# Patient Record
Sex: Female | Born: 1970 | Race: White | Hispanic: No | Smoking: Never smoker
Health system: Southern US, Community
[De-identification: ages and names within clinical notes are randomized; demographics above are authoritative.]

## PROBLEM LIST (undated history)

## (undated) DIAGNOSIS — Z5189 Encounter for other specified aftercare: Secondary | ICD-10-CM

## (undated) DIAGNOSIS — J45909 Unspecified asthma, uncomplicated: Secondary | ICD-10-CM

## (undated) DIAGNOSIS — M7751 Other enthesopathy of right foot: Secondary | ICD-10-CM

## (undated) DIAGNOSIS — F329 Major depressive disorder, single episode, unspecified: Secondary | ICD-10-CM

## (undated) DIAGNOSIS — I509 Heart failure, unspecified: Secondary | ICD-10-CM

## (undated) DIAGNOSIS — E079 Disorder of thyroid, unspecified: Secondary | ICD-10-CM

## (undated) DIAGNOSIS — J449 Chronic obstructive pulmonary disease, unspecified: Secondary | ICD-10-CM

## (undated) DIAGNOSIS — I4729 Other ventricular tachycardia: Secondary | ICD-10-CM

## (undated) DIAGNOSIS — E119 Type 2 diabetes mellitus without complications: Secondary | ICD-10-CM

## (undated) DIAGNOSIS — F32A Depression, unspecified: Secondary | ICD-10-CM

## (undated) DIAGNOSIS — I471 Supraventricular tachycardia, unspecified: Secondary | ICD-10-CM

## (undated) DIAGNOSIS — R9389 Abnormal findings on diagnostic imaging of other specified body structures: Secondary | ICD-10-CM

## (undated) DIAGNOSIS — I10 Essential (primary) hypertension: Secondary | ICD-10-CM

## (undated) DIAGNOSIS — I639 Cerebral infarction, unspecified: Secondary | ICD-10-CM

## (undated) DIAGNOSIS — I472 Ventricular tachycardia: Secondary | ICD-10-CM

## (undated) DIAGNOSIS — N939 Abnormal uterine and vaginal bleeding, unspecified: Secondary | ICD-10-CM

## (undated) DIAGNOSIS — F419 Anxiety disorder, unspecified: Secondary | ICD-10-CM

## (undated) DIAGNOSIS — M1990 Unspecified osteoarthritis, unspecified site: Secondary | ICD-10-CM

## (undated) DIAGNOSIS — Z9289 Personal history of other medical treatment: Secondary | ICD-10-CM

## (undated) DIAGNOSIS — R011 Cardiac murmur, unspecified: Secondary | ICD-10-CM

## (undated) DIAGNOSIS — E785 Hyperlipidemia, unspecified: Secondary | ICD-10-CM

## (undated) DIAGNOSIS — I421 Obstructive hypertrophic cardiomyopathy: Secondary | ICD-10-CM

## (undated) DIAGNOSIS — H269 Unspecified cataract: Secondary | ICD-10-CM

## (undated) DIAGNOSIS — D649 Anemia, unspecified: Secondary | ICD-10-CM

## (undated) HISTORY — DX: Hyperlipidemia, unspecified: E78.5

## (undated) HISTORY — DX: Cerebral infarction, unspecified: I63.9

## (undated) HISTORY — DX: Unspecified cataract: H26.9

## (undated) HISTORY — DX: Other enthesopathy of right foot and ankle: M77.51

## (undated) HISTORY — DX: Unspecified osteoarthritis, unspecified site: M19.90

## (undated) HISTORY — DX: Cardiac murmur, unspecified: R01.1

## (undated) HISTORY — DX: Disorder of thyroid, unspecified: E07.9

---

## 2007-01-30 ENCOUNTER — Emergency Department (HOSPITAL_COMMUNITY): Admission: EM | Admit: 2007-01-30 | Discharge: 2007-01-30 | Payer: Self-pay | Admitting: Emergency Medicine

## 2013-12-09 DIAGNOSIS — Z9289 Personal history of other medical treatment: Secondary | ICD-10-CM

## 2013-12-09 HISTORY — DX: Personal history of other medical treatment: Z92.89

## 2014-01-17 ENCOUNTER — Emergency Department (HOSPITAL_COMMUNITY)
Admission: EM | Admit: 2014-01-17 | Discharge: 2014-01-17 | Disposition: A | Discharge status: Home / Self Care | Payer: Medicare Other | Attending: Emergency Medicine | Admitting: Emergency Medicine

## 2014-01-17 ENCOUNTER — Encounter (HOSPITAL_COMMUNITY): Payer: Self-pay | Admitting: Emergency Medicine

## 2014-01-17 DIAGNOSIS — R509 Fever, unspecified: Secondary | ICD-10-CM | POA: Diagnosis not present

## 2014-01-17 DIAGNOSIS — I10 Essential (primary) hypertension: Secondary | ICD-10-CM | POA: Insufficient documentation

## 2014-01-17 DIAGNOSIS — J329 Chronic sinusitis, unspecified: Secondary | ICD-10-CM | POA: Diagnosis not present

## 2014-01-17 DIAGNOSIS — F3289 Other specified depressive episodes: Secondary | ICD-10-CM | POA: Diagnosis not present

## 2014-01-17 DIAGNOSIS — R059 Cough, unspecified: Secondary | ICD-10-CM | POA: Insufficient documentation

## 2014-01-17 DIAGNOSIS — F411 Generalized anxiety disorder: Secondary | ICD-10-CM | POA: Diagnosis not present

## 2014-01-17 DIAGNOSIS — J4489 Other specified chronic obstructive pulmonary disease: Secondary | ICD-10-CM | POA: Insufficient documentation

## 2014-01-17 DIAGNOSIS — Z760 Encounter for issue of repeat prescription: Secondary | ICD-10-CM | POA: Insufficient documentation

## 2014-01-17 DIAGNOSIS — Z862 Personal history of diseases of the blood and blood-forming organs and certain disorders involving the immune mechanism: Secondary | ICD-10-CM | POA: Insufficient documentation

## 2014-01-17 DIAGNOSIS — R05 Cough: Secondary | ICD-10-CM | POA: Insufficient documentation

## 2014-01-17 DIAGNOSIS — J449 Chronic obstructive pulmonary disease, unspecified: Secondary | ICD-10-CM | POA: Insufficient documentation

## 2014-01-17 DIAGNOSIS — H9209 Otalgia, unspecified ear: Secondary | ICD-10-CM | POA: Insufficient documentation

## 2014-01-17 DIAGNOSIS — Z792 Long term (current) use of antibiotics: Secondary | ICD-10-CM | POA: Insufficient documentation

## 2014-01-17 DIAGNOSIS — Z79899 Other long term (current) drug therapy: Secondary | ICD-10-CM | POA: Diagnosis not present

## 2014-01-17 DIAGNOSIS — F329 Major depressive disorder, single episode, unspecified: Secondary | ICD-10-CM | POA: Diagnosis not present

## 2014-01-17 DIAGNOSIS — J019 Acute sinusitis, unspecified: Secondary | ICD-10-CM | POA: Diagnosis not present

## 2014-01-17 DIAGNOSIS — J3489 Other specified disorders of nose and nasal sinuses: Secondary | ICD-10-CM | POA: Insufficient documentation

## 2014-01-17 HISTORY — DX: Anemia, unspecified: D64.9

## 2014-01-17 HISTORY — DX: Unspecified asthma, uncomplicated: J45.909

## 2014-01-17 HISTORY — DX: Anxiety disorder, unspecified: F41.9

## 2014-01-17 HISTORY — DX: Essential (primary) hypertension: I10

## 2014-01-17 HISTORY — DX: Depression, unspecified: F32.A

## 2014-01-17 HISTORY — DX: Chronic obstructive pulmonary disease, unspecified: J44.9

## 2014-01-17 HISTORY — DX: Major depressive disorder, single episode, unspecified: F32.9

## 2014-01-17 MED ORDER — LISINOPRIL 20 MG PO TABS
20.0000 mg | ORAL_TABLET | Freq: Every day | ORAL | Status: DC
Start: 2014-01-17 — End: 2014-03-03

## 2014-01-17 MED ORDER — PRAVASTATIN SODIUM 20 MG PO TABS: 20.0000 mg | ORAL_TABLET | Freq: Every day | ORAL | Status: DC

## 2014-01-17 MED ORDER — LISINOPRIL-HYDROCHLOROTHIAZIDE 20-25 MG PO TABS: 1.0000 | ORAL_TABLET | Freq: Every day | ORAL | Status: DC

## 2014-01-17 MED ORDER — DULOXETINE HCL 60 MG PO CPEP: 120.0000 mg | ORAL_CAPSULE | Freq: Every day | ORAL | Status: DC

## 2014-01-17 MED ORDER — AMOXICILLIN 500 MG PO CAPS: 500.0000 mg | ORAL_CAPSULE | Freq: Three times a day (TID) | ORAL | Status: DC

## 2014-01-17 NOTE — ED Notes (Signed)
Runny nose, cough, sore throat, since Saturday,  , rt ear pain.  No fever or chills.  No NVD

## 2014-01-17 NOTE — ED Notes (Signed)
Patient c/o sore throat, right earache, nasal congestion, and cough since Saturday. Denies any drainage from ears. Denies any fevers. Per patient dry, hacky cough.

## 2014-01-17 NOTE — Discharge Instructions (Signed)
Sinusitis Sinusitis is redness, soreness, and swelling (inflammation) of the paranasal sinuses. Paranasal sinuses are air pockets within the bones of your face (beneath the eyes, the middle of the forehead, or above the eyes). In healthy paranasal sinuses, mucus is able to drain out, and air is able to circulate through them by way of your nose. However, when your paranasal sinuses are inflamed, mucus and air can become trapped. This can allow bacteria and other germs to grow and cause infection. Sinusitis can develop quickly and last only a short time (acute) or continue over a long period (chronic). Sinusitis that lasts for more than 12 weeks is considered chronic.  CAUSES  Causes of sinusitis include:  Allergies.  Structural abnormalities, such as displacement of the cartilage that separates your nostrils (deviated septum), which can decrease the air flow through your nose and sinuses and affect sinus drainage.  Functional abnormalities, such as when the small hairs (cilia) that line your sinuses and help remove mucus do not work properly or are not present. SYMPTOMS  Symptoms of acute and chronic sinusitis are the same. The primary symptoms are pain and pressure around the affected sinuses. Other symptoms include:  Upper toothache.  Earache.  Headache.  Bad breath.  Decreased sense of smell and taste.  A cough, which worsens when you are lying flat.  Fatigue.  Fever.  Thick drainage from your nose, which often is green and may contain pus (purulent).  Swelling and warmth over the affected sinuses. DIAGNOSIS  Your caregiver will perform a physical exam. During the exam, your caregiver may:  Look in your nose for signs of abnormal growths in your nostrils (nasal polyps).  Tap over the affected sinus to check for signs of infection.  View the inside of your sinuses (endoscopy) with a special imaging device with a light attached (endoscope), which is inserted into your  sinuses. If your caregiver suspects that you have chronic sinusitis, one or more of the following tests may be recommended:  Allergy tests.  Nasal culture A sample of mucus is taken from your nose and sent to a lab and screened for bacteria.  Nasal cytology A sample of mucus is taken from your nose and examined by your caregiver to determine if your sinusitis is related to an allergy. TREATMENT  Most cases of acute sinusitis are related to a viral infection and will resolve on their own within 10 days. Sometimes medicines are prescribed to help relieve symptoms (pain medicine, decongestants, nasal steroid sprays, or saline sprays).  However, for sinusitis related to a bacterial infection, your caregiver will prescribe antibiotic medicines. These are medicines that will help kill the bacteria causing the infection.  Rarely, sinusitis is caused by a fungal infection. In theses cases, your caregiver will prescribe antifungal medicine. For some cases of chronic sinusitis, surgery is needed. Generally, these are cases in which sinusitis recurs more than 3 times per year, despite other treatments. HOME CARE INSTRUCTIONS   Drink plenty of water. Water helps thin the mucus so your sinuses can drain more easily.  Use a humidifier.  Inhale steam 3 to 4 times a day (for example, sit in the bathroom with the shower running).  Apply a warm, moist washcloth to your face 3 to 4 times a day, or as directed by your caregiver.  Use saline nasal sprays to help moisten and clean your sinuses.  Take over-the-counter or prescription medicines for pain, discomfort, or fever only as directed by your caregiver. [place]  CARE IF:  You have increasing pain or severe headaches.  You have nausea, vomiting, or drowsiness.  You have swelling around your face.  You have vision problems.  You have a stiff neck.  You have difficulty breathing. MAKE SURE YOU:   Understand these  instructions.  Will watch your condition.  Will get help right away if you are not doing well or get worse. Document Released: 11/25/2005 Document Revised: 02/17/2012 Document Reviewed: 12/10/2011 [facility] Patient Information 2014 [place], [place].  Medication Refill, Emergency Department We have refilled your medication today as a courtesy to you. It is best for your medical care, however, to take care of getting refills done through your primary caregiver's office. They have your records and can do a better job of follow-up than we can in the emergency department. On maintenance medications, we often only prescribe enough medications to get you by until you are able to see your regular caregiver. This is a more expensive way to refill medications. In the future, please plan for refills so that you will not have to use the emergency department for this. Thank you for your help. Your help allows [place] to better take care of the daily emergencies that enter our department. Document Released: 03/13/2004 Document Revised: 02/17/2012 Document Reviewed: 11/25/2005 [facility] Patient Information 2014 [place], [place].

## 2014-01-18 NOTE — ED Provider Notes (Signed)
CSN: [id number]     Arrival date & time 01/17/14  1352 History   First MD Initiated Contact with Patient 01/17/14 1610     Chief Complaint  Patient presents with  . Sore Throat  . Otalgia     (Consider location/radiation/quality/duration/timing/severity/associated sxs/prior Treatment) HPI Comments: Hlee Fringer is a 43 y.o. Female presenting with a 2 day history of uri type symptoms which includes nasal congestion with thick, green and blood tinged drainage, sore throat, subjective fever, right ear pain  nonproductive cough.  Symptoms due to not include shortness of breath, chest pain,  Nausea, vomiting or diarrhea, decreased hearing acuity, ear drainage, dizziness or neck pain.  The patient has taken tylenol prior to arrival with no significant improvement in symptoms.  Additionally, requests medication refills for all of her home medicines.  She is here from her home in [place] caring for her mother and did not anticipate being here this long..  She has run out of her medicines both yesterday and today and will not be back home until the end of this month.      The history is provided by the patient.    Past Medical History  Diagnosis Date  . COPD (chronic obstructive pulmonary disease)   . Depression   . Hypertension   . Asthma   . Anxiety   . Anemia    History reviewed. No pertinent past surgical history. History reviewed. No pertinent family history. History  Substance Use Topics  . Smoking status: Never Smoker   . Smokeless tobacco: Never Used  . Alcohol Use: No   OB History   Grav Para Term Preterm Abortions TAB SAB Ect Mult Living            0     Review of Systems  Constitutional: Positive for fever. Negative for chills.  HENT: Positive for congestion, ear pain, rhinorrhea, sinus pressure and sore throat. Negative for facial swelling, trouble swallowing and voice change.   Eyes: Negative for discharge.  Respiratory: Positive for cough. Negative for  shortness of breath, wheezing and stridor.   Cardiovascular: Negative for chest pain.  Gastrointestinal: Negative for abdominal pain.  Genitourinary: Negative.   Neurological: Negative for dizziness, light-headedness and headaches.      Allergies  Review of patient's allergies indicates no known allergies.  Home Medications   Current Outpatient Rx  Name  Route  Sig  Dispense  Refill  . DULoxetine (CYMBALTA) 60 MG capsule   Oral   Take 120 mg by mouth daily.         [name] lisinopril (PRINIVIL,ZESTRIL) 20 MG tablet   Oral   Take 20 mg by mouth daily.         [name] lisinopril-hydrochlorothiazide (PRINZIDE,ZESTORETIC) 20-25 MG per tablet   Oral   Take 1 tablet by mouth daily.         . pravastatin (PRAVACHOL) 20 MG tablet   Oral   Take 20 mg by mouth daily.         [name] amoxicillin (AMOXIL) 500 MG capsule   Oral   Take 1 capsule (500 mg total) by mouth 3 (three) times daily.   30 capsule   0   . DULoxetine (CYMBALTA) 60 MG capsule   Oral   Take 2 capsules (120 mg total) by mouth daily.   60 capsule   0   . lisinopril (PRINIVIL,ZESTRIL) 20 MG tablet   Oral   Take 1 tablet (20 mg total) by mouth daily.  30 tablet   0   . lisinopril-hydrochlorothiazide (PRINZIDE,ZESTORETIC) 20-25 MG per tablet   Oral   Take 1 tablet by mouth daily.   30 tablet   0   . pravastatin (PRAVACHOL) 20 MG tablet   Oral   Take 1 tablet (20 mg total) by mouth daily.   30 tablet   0    BP 140/77  Pulse 88  Temp(Src) 98.7 F (37.1 C) (Oral)  Resp 20  Ht 5\' 9"  (1.753 m)  Wt 370 lb (167.831 kg)  BMI 54.61 kg/m2  SpO2 98%  LMP 12/28/2012 Physical Exam  Constitutional: She is oriented to person, place, and time. She appears well-developed and well-nourished.  HENT:  Head: Normocephalic and atraumatic.  Right Ear: Tympanic membrane and ear canal normal. Tympanic membrane is not injected. No middle ear effusion.  Left Ear: Tympanic membrane and ear canal normal. Tympanic membrane is  not injected.  No middle ear effusion.  Nose: Mucosal edema and rhinorrhea present. Right sinus exhibits maxillary sinus tenderness.  Mouth/Throat: Uvula is midline, oropharynx is clear and moist and mucous membranes are normal. No oropharyngeal exudate, posterior oropharyngeal edema, posterior oropharyngeal erythema or tonsillar abscesses.  Eyes: Conjunctivae are normal.  Neck: Normal range of motion. Neck supple.  Cardiovascular: Normal rate and normal heart sounds.   Pulmonary/Chest: Effort normal. No respiratory distress. She has no decreased breath sounds. She has no wheezes. She has no rhonchi. She has no rales.  Abdominal: Soft. There is no tenderness.  Musculoskeletal: Normal range of motion.  Neurological: She is alert and oriented to person, place, and time.  Skin: Skin is warm and dry. No rash noted.  Psychiatric: She has a normal mood and affect.    ED Course  Procedures (including critical care time) Labs Review Labs Reviewed - No data to display Imaging Review No results found.  EKG Interpretation   None       MDM   Final diagnoses:  Sinusitis  Medication refill    Pt with sx and exam finding c/w right maxillary sinusitis.  She was prescribed amoxil.  Also given 30 day refill for her chronic home meds including her lisinopril, pravachol and cymbalta.  Encouraged otc Coricidin for decongestion use.  Prn f/u for persistent or worse sx.  Pt understands and agrees with plan.    Evalee Jefferson, PA-C 01/18/14 1651

## 2014-01-20 NOTE — ED Provider Notes (Signed)
Medical screening examination/treatment/procedure(s) were performed by non-physician practitioner and as supervising physician I was immediately available for consultation/collaboration.  EKG Interpretation   None         Maudry Diego, MD 01/20/14 1436

## 2014-02-15 DIAGNOSIS — F331 Major depressive disorder, recurrent, moderate: Secondary | ICD-10-CM | POA: Diagnosis not present

## 2014-02-21 DIAGNOSIS — F341 Dysthymic disorder: Secondary | ICD-10-CM | POA: Diagnosis not present

## 2014-03-02 ENCOUNTER — Observation Stay (HOSPITAL_COMMUNITY)
Admission: EM | Admit: 2014-03-02 | Discharge: 2014-03-03 | Disposition: A | Discharge status: Home / Self Care | Payer: Medicare Other | Attending: Internal Medicine | Admitting: Internal Medicine

## 2014-03-02 ENCOUNTER — Emergency Department (HOSPITAL_COMMUNITY): Payer: Medicare Other

## 2014-03-02 ENCOUNTER — Encounter (HOSPITAL_COMMUNITY): Payer: Self-pay | Admitting: Emergency Medicine

## 2014-03-02 ENCOUNTER — Observation Stay (HOSPITAL_COMMUNITY): Payer: Medicare Other

## 2014-03-02 DIAGNOSIS — R0989 Other specified symptoms and signs involving the circulatory and respiratory systems: Secondary | ICD-10-CM | POA: Diagnosis not present

## 2014-03-02 DIAGNOSIS — R0600 Dyspnea, unspecified: Secondary | ICD-10-CM

## 2014-03-02 DIAGNOSIS — I10 Essential (primary) hypertension: Secondary | ICD-10-CM | POA: Diagnosis not present

## 2014-03-02 DIAGNOSIS — R0609 Other forms of dyspnea: Secondary | ICD-10-CM | POA: Insufficient documentation

## 2014-03-02 DIAGNOSIS — J45909 Unspecified asthma, uncomplicated: Secondary | ICD-10-CM | POA: Diagnosis present

## 2014-03-02 DIAGNOSIS — Z6841 Body Mass Index (BMI) 40.0 and over, adult: Secondary | ICD-10-CM | POA: Insufficient documentation

## 2014-03-02 DIAGNOSIS — I2699 Other pulmonary embolism without acute cor pulmonale: Principal | ICD-10-CM | POA: Insufficient documentation

## 2014-03-02 DIAGNOSIS — F3289 Other specified depressive episodes: Secondary | ICD-10-CM | POA: Insufficient documentation

## 2014-03-02 DIAGNOSIS — R5381 Other malaise: Secondary | ICD-10-CM | POA: Diagnosis not present

## 2014-03-02 DIAGNOSIS — R0602 Shortness of breath: Secondary | ICD-10-CM | POA: Diagnosis not present

## 2014-03-02 DIAGNOSIS — F411 Generalized anxiety disorder: Secondary | ICD-10-CM | POA: Diagnosis not present

## 2014-03-02 DIAGNOSIS — F329 Major depressive disorder, single episode, unspecified: Secondary | ICD-10-CM | POA: Insufficient documentation

## 2014-03-02 DIAGNOSIS — I999 Unspecified disorder of circulatory system: Secondary | ICD-10-CM | POA: Diagnosis not present

## 2014-03-02 LAB — CBC WITH DIFFERENTIAL/PLATELET
BASOS ABS: 0 10*3/uL (ref 0.0–0.1)
Basophils Relative: 0 % (ref 0–1)
EOS ABS: 0.4 10*3/uL (ref 0.0–0.7)
EOS PCT: 4 % (ref 0–5)
HCT: 30.8 % — ABNORMAL LOW (ref 36.0–46.0)
HEMOGLOBIN: 9.7 g/dL — AB (ref 12.0–15.0)
LYMPHS ABS: 1.8 10*3/uL (ref 0.7–4.0)
Lymphocytes Relative: 19 % (ref 12–46)
MCH: 25.6 pg — ABNORMAL LOW (ref 26.0–34.0)
MCHC: 31.5 g/dL (ref 30.0–36.0)
MCV: 81.3 fL (ref 78.0–100.0)
MONO ABS: 0.5 10*3/uL (ref 0.1–1.0)
MONOS PCT: 5 % (ref 3–12)
NEUTROS PCT: 73 % (ref 43–77)
Neutro Abs: 7 10*3/uL (ref 1.7–7.7)
PLATELETS: 277 10*3/uL (ref 150–400)
RBC: 3.79 MIL/uL — AB (ref 3.87–5.11)
RDW: 14.2 % (ref 11.5–15.5)
WBC: 9.7 10*3/uL (ref 4.0–10.5)

## 2014-03-02 LAB — PROTIME-INR
INR: 1.11 (ref 0.00–1.49)
PROTHROMBIN TIME: 14.1 s (ref 11.6–15.2)

## 2014-03-02 LAB — BASIC METABOLIC PANEL
BUN: 15 mg/dL (ref 6–23)
CHLORIDE: 107 meq/L (ref 96–112)
CO2: 28 meq/L (ref 19–32)
Calcium: 8.6 mg/dL (ref 8.4–10.5)
Creatinine, Ser: 0.82 mg/dL (ref 0.50–1.10)
GFR calc Af Amer: >90 mL/min (ref >90)
GFR calc non Af Amer: 85 mL/min — ABNORMAL LOW (ref >90)
Glucose, Bld: 113 mg/dL — ABNORMAL HIGH (ref 70–99)
POTASSIUM: 4.3 meq/L (ref 3.7–5.3)
SODIUM: 143 meq/L (ref 137–147)

## 2014-03-02 LAB — TROPONIN I
Troponin I: <0.3 ng/mL (ref <0.30)
Troponin I: <0.3 ng/mL (ref <0.30)
Troponin I: <0.3 ng/mL (ref <0.30)

## 2014-03-02 LAB — HEPATIC FUNCTION PANEL
ALK PHOS: 91 U/L (ref 39–117)
ALT: 32 U/L (ref 0–35)
AST: 28 U/L (ref 0–37)
Albumin: 3.3 g/dL — ABNORMAL LOW (ref 3.5–5.2)
BILIRUBIN TOTAL: 0.3 mg/dL (ref 0.3–1.2)
Bilirubin, Direct: <0.2 mg/dL (ref 0.0–0.3)
TOTAL PROTEIN: 7.1 g/dL (ref 6.0–8.3)

## 2014-03-02 LAB — D-DIMER, QUANTITATIVE: D-Dimer, Quant: 1.32 ug/mL-FEU — ABNORMAL HIGH (ref 0.00–0.48)

## 2014-03-02 LAB — PRO B NATRIURETIC PEPTIDE: Pro B Natriuretic peptide (BNP): 113.7 pg/mL (ref 0–125)

## 2014-03-02 LAB — APTT: aPTT: 33 seconds (ref 24–37)

## 2014-03-02 MED ORDER — ACETAMINOPHEN 325 MG PO TABS
650.0000 mg | ORAL_TABLET | Freq: Four times a day (QID) | ORAL | Status: DC | PRN
  Administered 2014-03-02: 650 mg via ORAL
  Filled 2014-03-02: qty 2

## 2014-03-02 MED ORDER — BUPROPION HCL ER (XL) 150 MG PO TB24
150.0000 mg | ORAL_TABLET | Freq: Every day | ORAL | Status: DC
  Administered 2014-03-03: 150 mg via ORAL
  Filled 2014-03-02 (×2): qty 1

## 2014-03-02 MED ORDER — ALBUTEROL SULFATE (2.5 MG/3ML) 0.083% IN NEBU
2.5000 mg | INHALATION_SOLUTION | Freq: Four times a day (QID) | RESPIRATORY_TRACT | Status: DC | PRN
  Administered 2014-03-03: 2.5 mg via RESPIRATORY_TRACT
  Filled 2014-03-02: qty 3

## 2014-03-02 MED ORDER — SODIUM CHLORIDE 0.9 % IJ SOLN
INTRAMUSCULAR | Status: AC
  Filled 2014-03-02: qty 80

## 2014-03-02 MED ORDER — SODIUM CHLORIDE 0.9 % IJ SOLN
3.0000 mL | Freq: Two times a day (BID) | INTRAMUSCULAR | Status: DC
  Administered 2014-03-03: 3 mL via INTRAVENOUS

## 2014-03-02 MED ORDER — FLUTICASONE PROPIONATE HFA 110 MCG/ACT IN AERO
INHALATION_SPRAY | RESPIRATORY_TRACT | Status: AC
  Filled 2014-03-02: qty 12

## 2014-03-02 MED ORDER — ONDANSETRON HCL 4 MG/2ML IJ SOLN: 4.0000 mg | Freq: Four times a day (QID) | INTRAMUSCULAR | Status: DC | PRN

## 2014-03-02 MED ORDER — RIVAROXABAN 15 MG PO TABS
15.0000 mg | ORAL_TABLET | Freq: Two times a day (BID) | ORAL | Status: DC
  Administered 2014-03-02 – 2014-03-03 (×2): 15 mg via ORAL
  Filled 2014-03-02 (×2): qty 1

## 2014-03-02 MED ORDER — ACETAMINOPHEN 650 MG RE SUPP: 650.0000 mg | Freq: Four times a day (QID) | RECTAL | Status: DC | PRN

## 2014-03-02 MED ORDER — ONDANSETRON HCL 4 MG PO TABS: 4.0000 mg | ORAL_TABLET | Freq: Four times a day (QID) | ORAL | Status: DC | PRN

## 2014-03-02 MED ORDER — HYDROCHLOROTHIAZIDE 25 MG PO TABS
25.0000 mg | ORAL_TABLET | Freq: Every day | ORAL | Status: DC
  Administered 2014-03-02 – 2014-03-03 (×2): 25 mg via ORAL
  Filled 2014-03-02 (×2): qty 1

## 2014-03-02 MED ORDER — SODIUM CHLORIDE 0.9 % IV SOLN: 250.0000 mL | INTRAVENOUS | Status: DC | PRN

## 2014-03-02 MED ORDER — SODIUM CHLORIDE 0.9 % IJ SOLN: 3.0000 mL | INTRAMUSCULAR | Status: DC | PRN

## 2014-03-02 MED ORDER — LISINOPRIL-HYDROCHLOROTHIAZIDE 20-25 MG PO TABS: 1.0000 | ORAL_TABLET | Freq: Every day | ORAL | Status: DC

## 2014-03-02 MED ORDER — PREDNISONE 10 MG PO TABS
20.0000 mg | ORAL_TABLET | Freq: Every day | ORAL | Status: DC
Start: 2014-03-02 — End: 2014-03-03

## 2014-03-02 MED ORDER — FLUTICASONE PROPIONATE HFA 110 MCG/ACT IN AERO
2.0000 | INHALATION_SPRAY | Freq: Two times a day (BID) | RESPIRATORY_TRACT | Status: DC
  Administered 2014-03-03: 2 via RESPIRATORY_TRACT
  Filled 2014-03-02: qty 12

## 2014-03-02 MED ORDER — LISINOPRIL 10 MG PO TABS
20.0000 mg | ORAL_TABLET | Freq: Every day | ORAL | Status: DC
  Administered 2014-03-02 – 2014-03-03 (×2): 20 mg via ORAL
  Filled 2014-03-02 (×2): qty 2

## 2014-03-02 MED ORDER — SIMVASTATIN 10 MG PO TABS: 10.0000 mg | ORAL_TABLET | Freq: Every day | ORAL | Status: DC

## 2014-03-02 MED ORDER — IOHEXOL 350 MG/ML SOLN
125.0000 mL | Freq: Once | INTRAVENOUS | Status: AC | PRN
  Administered 2014-03-02: 125 mL via INTRAVENOUS

## 2014-03-02 MED ORDER — SODIUM CHLORIDE 0.9 % IJ SOLN
INTRAMUSCULAR | Status: AC
  Administered 2014-03-02: 17:00:00
  Filled 2014-03-02: qty 400

## 2014-03-02 MED ORDER — SODIUM CHLORIDE 0.9 % IJ SOLN
3.0000 mL | Freq: Two times a day (BID) | INTRAMUSCULAR | Status: DC
  Administered 2014-03-02 – 2014-03-03 (×2): 3 mL via INTRAVENOUS

## 2014-03-02 MED ORDER — RIVAROXABAN 20 MG PO TABS: 20.0000 mg | ORAL_TABLET | Freq: Every day | ORAL | Status: DC

## 2014-03-02 NOTE — ED Notes (Signed)
Pt states she has been SOB for a week. Denies other symptoms. Pt tearful and hyperventilating

## 2014-03-02 NOTE — ED Provider Notes (Signed)
CSN: [id number]     Arrival date & time 03/02/14  1216 History   First MD Initiated Contact with Patient 03/02/14 1305     Chief Complaint  Patient presents with  . Shortness of Breath     (Consider location/radiation/quality/duration/timing/severity/associated sxs/prior Treatment) Patient is a 43 y.o. female presenting with shortness of breath. The history is provided by the patient (the pt complains of shortness of breath).  Shortness of Breath Severity:  Moderate Onset quality:  Sudden Timing:  Constant Progression:  Waxing and waning Chronicity:  New Context: not animal exposure   Associated symptoms: no abdominal pain, no chest pain, no cough, no headaches and no rash     Past Medical History  Diagnosis Date  . COPD (chronic obstructive pulmonary disease)   . Depression   . Hypertension   . Asthma   . Anxiety   . Anemia    History reviewed. No pertinent past surgical history. No family history on file. History  Substance Use Topics  . Smoking status: Never Smoker   . Smokeless tobacco: Never Used  . Alcohol Use: No   OB History   Grav Para Term Preterm Abortions TAB SAB Ect Mult Living            0     Review of Systems  Constitutional: Negative for appetite change and fatigue.  HENT: Negative for congestion, ear discharge and sinus pressure.   Eyes: Negative for discharge.  Respiratory: Positive for shortness of breath. Negative for cough.   Cardiovascular: Negative for chest pain.  Gastrointestinal: Negative for abdominal pain and diarrhea.  Genitourinary: Negative for frequency and hematuria.  Musculoskeletal: Negative for back pain.  Skin: Negative for rash.  Neurological: Negative for seizures and headaches.  Psychiatric/Behavioral: Negative for hallucinations.      Allergies  Review of patient's allergies indicates no known allergies.  Home Medications   Current Outpatient Rx  Name  Route  Sig  Dispense  Refill  . albuterol (PROVENTIL  HFA;VENTOLIN HFA) 108 (90 BASE) MCG/ACT inhaler   Inhalation   Inhale 2 puffs into the lungs every 6 (six) hours as needed for wheezing or shortness of breath.         [name] albuterol (PROVENTIL) (2.5 MG/3ML) 0.083% nebulizer solution   Nebulization   Take 2.5 mg by nebulization every 6 (six) hours as needed for wheezing or shortness of breath.         [name] buPROPion (WELLBUTRIN XL) 150 MG 24 hr tablet   Oral   Take 150 mg by mouth daily.         [name] ibuprofen (ADVIL,MOTRIN) 200 MG tablet   Oral   Take 600 mg by mouth every 6 (six) hours as needed for headache.         . lisinopril (PRINIVIL,ZESTRIL) 20 MG tablet   Oral   Take 1 tablet (20 mg total) by mouth daily.   30 tablet   0   . lisinopril-hydrochlorothiazide (PRINZIDE,ZESTORETIC) 20-25 MG per tablet   Oral   Take 1 tablet by mouth daily.   30 tablet   0   . pravastatin (PRAVACHOL) 20 MG tablet   Oral   Take 20 mg by mouth daily.         . predniSONE (DELTASONE) 10 MG tablet   Oral   Take 2 tablets (20 mg total) by mouth daily.   14 tablet   0    BP 122/71  Pulse 70  Temp(Src) 97.9 F (36.6 C) (  Oral)  Resp 20  Ht 5\' 9"  (1.753 m)  Wt 402 lb 7 oz (182.544 kg)  BMI 59.40 kg/m2  SpO2 97%  LMP 02/02/2014 Physical Exam  Constitutional: She is oriented to person, place, and time. She appears well-developed.  HENT:  Head: Normocephalic.  Eyes: Conjunctivae and EOM are normal. No scleral icterus.  Neck: Neck supple. No thyromegaly present.  Cardiovascular: Normal rate and regular rhythm.  Exam reveals no gallop and no friction rub.   No murmur heard. Pulmonary/Chest: No stridor. She has no wheezes. She has no rales. She exhibits no tenderness.  Abdominal: She exhibits no distension. There is no tenderness. There is no rebound.  Musculoskeletal: Normal range of motion. She exhibits no edema.  Lymphadenopathy:    She has no cervical adenopathy.  Neurological: She is oriented to person, place, and time. She  exhibits normal muscle tone. Coordination normal.  Skin: No rash noted. No erythema.  Psychiatric: She has a normal mood and affect. Her behavior is normal.    ED Course  Procedures (including critical care time) Labs Review Labs Reviewed  CBC WITH DIFFERENTIAL - Abnormal; Notable for the following:    RBC 3.79 (*)    Hemoglobin 9.7 (*)    HCT 30.8 (*)    MCH 25.6 (*)    All other components within normal limits  BASIC METABOLIC PANEL - Abnormal; Notable for the following:    Glucose, Bld 113 (*)    GFR calc non Af Amer 85 (*)    All other components within normal limits  D-DIMER, QUANTITATIVE - Abnormal; Notable for the following:    D-Dimer, Quant 1.32 (*)    All other components within normal limits  HEPATIC FUNCTION PANEL - Abnormal; Notable for the following:    Albumin 3.3 (*)    All other components within normal limits  TROPONIN I  PRO B NATRIURETIC PEPTIDE   Imaging Review Dg Chest 2 View  03/02/2014   CLINICAL DATA:  Short of breath for few days.  History of asthma.  EXAM: CHEST  2 VIEW  COMPARISON:  DG CHEST 2 VIEW dated 01/30/2007  FINDINGS: Lateral view degraded by patient arm position. Lateral view is also a minimally motion degraded. Moderate thoracic spondylosis. Midline trachea. Borderline cardiomegaly. Mediastinal contours otherwise within normal limits. No pleural effusion or pneumothorax. Clear lungs.  IMPRESSION: Borderline cardiomegaly, without acute disease.   Electronically Signed   By: Abigail Miyamoto M.D.   On: 03/02/2014 13:20     EKG Interpretation   Date/Time:  Wednesday March 02 2014 12:44:24 EDT Ventricular Rate:  71 PR Interval:  158 QRS Duration: 100 QT Interval:  416 QTC Calculation: 452 R Axis:   12 Text Interpretation:  Normal sinus rhythm T wave abnormality, consider  lateral ischemia Abnormal ECG No previous ECGs available Confirmed by  Meron Bocchino  MD, [place] Name [phone]) on 03/02/2014 4:29:27 PM      MDM   Final diagnoses:  Dyspnea        Maudry Diego, MD 03/03/14 [phone]

## 2014-03-02 NOTE — H&P (Signed)
[facility] History and Physical  Erin Sexton RCV:[id number] DOB: [birth year] DOA: 03/02/2014  Referring physician: Dr. Karle Starch PCP: No primary provider on file.   Chief Complaint: shortness of breath  HPI: Erin Sexton is a 43 y.o. female history of hypertension, morbid obesity since the emergency room with complaints of shortness of breath and chest pressure. She describes substernal chest pressure and shortness of breath occurring over the past week, progressively getting worse. She has dyspnea on exertion. She feels that her legs have been swelling, but attributes this to not taking her blood pressure medications. Patient recently moved to [place] from [place] and has not established primary care here. She denies being on any hormonal treatments. She does not have any family history of VTE. She's not had any recent surgeries or other prolonged immobilization. She does describe herself as sedentary. She was evaluated in the emergency room where CT scan of the chest indicated possible small bilateral pulmonary emboli. She'll be admitted for further treatments.   Review of Systems:  Pertinent positives as per HPI, otherwise negative   Past Medical History  Diagnosis Date  . COPD (chronic obstructive pulmonary disease)   . Depression   . Hypertension   . Asthma   . Anxiety   . Anemia    History reviewed. No pertinent past surgical history. Social History:  reports that she has never smoked. She has never used smokeless tobacco. She reports that she does not drink alcohol or use illicit drugs.  No Known Allergies  Family history: no family history of VTE   Prior to Admission medications   Medication Sig Start Date End Date Taking? Authorizing Provider  albuterol (PROVENTIL HFA;VENTOLIN HFA) 108 (90 BASE) MCG/ACT inhaler Inhale 2 puffs into the lungs every 6 (six) hours as needed for wheezing or shortness of breath.   Yes Historical Provider, MD  albuterol  (PROVENTIL) (2.5 MG/3ML) 0.083% nebulizer solution Take 2.5 mg by nebulization every 6 (six) hours as needed for wheezing or shortness of breath.   Yes Historical Provider, MD  buPROPion (WELLBUTRIN XL) 150 MG 24 hr tablet Take 150 mg by mouth daily.   Yes Historical Provider, MD  ibuprofen (ADVIL,MOTRIN) 200 MG tablet Take 600 mg by mouth every 6 (six) hours as needed for headache.   Yes Historical Provider, MD  lisinopril (PRINIVIL,ZESTRIL) 20 MG tablet Take 1 tablet (20 mg total) by mouth daily. 01/17/14  Yes Evalee Jefferson, PA-C  lisinopril-hydrochlorothiazide (PRINZIDE,ZESTORETIC) 20-25 MG per tablet Take 1 tablet by mouth daily. 01/17/14  Yes Almyra Free Idol, PA-C  pravastatin (PRAVACHOL) 20 MG tablet Take 20 mg by mouth daily.   Yes Historical Provider, MD  predniSONE (DELTASONE) 10 MG tablet Take 2 tablets (20 mg total) by mouth daily. 03/02/14   Maudry Diego, MD   Physical Exam: Filed Vitals:   03/02/14 1844  BP: 154/57  Pulse: 78  Temp: 98.3 F (36.8 C)  Resp: 20    BP 154/57  Pulse 78  Temp(Src) 98.3 F (36.8 C) (Oral)  Resp 20  Ht 5\' 9"  (1.753 m)  Wt 182.4 kg (402 lb 1.9 oz)  BMI 59.36 kg/m2  SpO2 100%  LMP 02/02/2014  General:  Appears anxious but in no acute distress, morbid obesity Eyes: PERRL, normal lids, irises & conjunctiva ENT: grossly normal hearing, lips & tongue Neck: no LAD, masses or thyromegaly Cardiovascular: RRR, no m/r/g. No LE edema. Telemetry: SR, no arrhythmias  Respiratory: CTA bilaterally, no w/r/r. Normal respiratory effort. Abdomen: soft, nt nd Skin:  no rash or induration seen on limited exam Musculoskeletal: grossly normal tone BUE/BLE Psychiatric: grossly normal mood and affect, speech fluent and appropriate Neurologic: grossly non-focal.          Labs on Admission:  Basic Metabolic Panel:  Recent Labs Lab 03/02/14 1333  NA 143  K 4.3  CL 107  CO2 28  GLUCOSE 113*  BUN 15  CREATININE 0.82  CALCIUM 8.6   Liver Function  Tests:  Recent Labs Lab 03/02/14 1333  AST 28  ALT 32  ALKPHOS 91  BILITOT 0.3  PROT 7.1  ALBUMIN 3.3*   No results found for this basename: LIPASE, AMYLASE,  in the last 168 hours No results found for this basename: AMMONIA,  in the last 168 hours CBC:  Recent Labs Lab 03/02/14 1333  WBC 9.7  NEUTROABS 7.0  HGB 9.7*  HCT 30.8*  MCV 81.3  PLT 277   Cardiac Enzymes:  Recent Labs Lab 03/02/14 1333 03/02/14 1649  TROPONINI <0.30 <0.30    BNP (last 3 results)  Recent Labs  03/02/14 1333  PROBNP 113.7   CBG: No results found for this basename: GLUCAP,  in the last 168 hours  Radiological Exams on Admission: Dg Chest 2 View  03/02/2014   CLINICAL DATA:  Short of breath for few days.  History of asthma.  EXAM: CHEST  2 VIEW  COMPARISON:  DG CHEST 2 VIEW dated 01/30/2007  FINDINGS: Lateral view degraded by patient arm position. Lateral view is also a minimally motion degraded. Moderate thoracic spondylosis. Midline trachea. Borderline cardiomegaly. Mediastinal contours otherwise within normal limits. No pleural effusion or pneumothorax. Clear lungs.  IMPRESSION: Borderline cardiomegaly, without acute disease.   Electronically Signed   By: Abigail Miyamoto M.D.   On: 03/02/2014 13:20   Ct Angio Chest Pe W/cm &/or Wo Cm  03/02/2014   CLINICAL DATA:  Shortness of breath.  EXAM: CT ANGIOGRAPHY CHEST WITH CONTRAST  TECHNIQUE: Multidetector CT imaging of the chest was performed using the standard protocol during bolus administration of intravenous contrast. Multiplanar CT image reconstructions and MIPs were obtained to evaluate the vascular anatomy.  CONTRAST:  168mL OMNIPAQUE IOHEXOL 350 MG/ML SOLN  COMPARISON:  DG CHEST 2 VIEW dated 03/02/2014  FINDINGS: Thoracic aorta is unremarkable. 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 The this can occasionally result in dysphagia. This is a normal variant. Pulmonary arteries are difficult to evaluate due  to patient size and phase of enhancement. Tiny bilateral pulmonary emboli cannot be excluded. No major pulmonary artery emboli are present. Cardiomegaly. Coronary artery disease cannot be excluded. No pericardial effusion.  Shotty mediastinal lymph nodes. No pathologic adenopathy is CT size criteria. Thoracic esophagus is unremarkable. Adrenals unremarkable. Visualized upper abdominal viscera unremarkable.  Large airways patent. Mild pleural thickening noted consistent scarring. Mild basilar atelectasis.  Thyroid normal size. No significant axillary adenopathy. Chest wall is intact. Degenerative changes lumbar spine. No acute bony abnormality.  Review of the MIP images confirms the above findings.  IMPRESSION: 1. Limited study. Cannot exclude very tiny bilateral pulmonary emboli. 2. Aberrant right subclavian artery, this is a normal finding. This can result and mild esophageal compression and dysplasia. 3. Cardiomegaly. Coronary artery disease cannot be excluded. Critical Value/emergent results were called by telephone at the time of interpretation on 03/02/2014 at 4:40 PM to Dr. Mikeal Hawthorne , who verbally acknowledged these results.   Electronically Signed   By: Marcello Moores  Register   On: 03/02/2014 16:43   US  Venous Img Lower Bilateral  03/02/2014   CLINICAL DATA:  Equivocal chest CTA  EXAM: Bilateral LOWER EXTREMITY VENOUS DOPPLER ULTRASOUND  TECHNIQUE: Gray-scale sonography with graded compression, as well as color Doppler and duplex ultrasound, were performed to evaluate the deep venous system from the level of the common femoral vein through the popliteal and proximal calf veins. Spectral Doppler was utilized to evaluate flow at rest and with distal augmentation maneuvers.  COMPARISON:  None.  FINDINGS: Thrombus within deep veins:  None visualized.  Compressibility of deep veins:  Normal.  Duplex waveform respiratory phasicity:  Normal.  Duplex waveform response to augmentation:  Normal.  Venous reflux:  None  visualized.  Other findings:  None visualized.  IMPRESSION: No deep venous thrombosis is noted bilaterally.   Electronically Signed   By: Inez Catalina M.D.   On: 03/02/2014 17:59    EKG: Independently reviewed. No acute changes  Assessment/Plan Principal Problem:   Pulmonary embolism Active Problems:   Morbid obesity   Essential hypertension, benign   Asthma, chronic   Dyspnea   Pulmonary embolus   1. Pulmonary emboli. Patient will be started on Xarelto for anticoagulation. She'll need at least 3-6 months of anticoagulation. We'll check hypercoagulable panel. She does not have any evidence of JVD on venous Dopplers. No other clear inciting factor for VTE. 2. Dyspnea. Likely related to #1 as well as general deconditioning from obesity. Physical therapy consultation. 3. Asthma, appears uncontrolled. She's using her albuterol on a daily basis. We will add Flovent for maintenance therapy. 4. Hypertension. Continue outpatient management.  Code Status: full code Family Communication: discussed with patient Disposition Plan: discharge home once improved. Patient will need to be set up with a primary care physician.  Time spent: 65mins  MEMON,JEHANZEB [facility] Pager [phone]

## 2014-03-02 NOTE — ED Provider Notes (Signed)
  Ct Angio Chest Pe W/cm &/or Wo Cm  03/02/2014   CLINICAL DATA:  Shortness of breath.  EXAM: CT ANGIOGRAPHY CHEST WITH CONTRAST  TECHNIQUE: Multidetector CT imaging of the chest was performed using the standard protocol during bolus administration of intravenous contrast. Multiplanar CT image reconstructions and MIPs were obtained to evaluate the vascular anatomy.  CONTRAST:  143mL OMNIPAQUE IOHEXOL 350 MG/ML SOLN  COMPARISON:  DG CHEST 2 VIEW dated 03/02/2014  FINDINGS: Thoracic aorta is unremarkable. 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 The this can occasionally result in dysphagia. This is a normal variant. Pulmonary arteries are difficult to evaluate due to patient size and phase of enhancement. Tiny bilateral pulmonary emboli cannot be excluded. No major pulmonary artery emboli are present. Cardiomegaly. Coronary artery disease cannot be excluded. No pericardial effusion.  Shotty mediastinal lymph nodes. No pathologic adenopathy is CT size criteria. Thoracic esophagus is unremarkable. Adrenals unremarkable. Visualized upper abdominal viscera unremarkable.  Large airways patent. Mild pleural thickening noted consistent scarring. Mild basilar atelectasis.  Thyroid normal size. No significant axillary adenopathy. Chest wall is intact. Degenerative changes lumbar spine. No acute bony abnormality.  Review of the MIP images confirms the above findings.  IMPRESSION: 1. Limited study. Cannot exclude very tiny bilateral pulmonary emboli. 2. Aberrant right subclavian artery, this is a normal finding. This can result and mild esophageal compression and dysplasia. 3. Cardiomegaly. Coronary artery disease cannot be excluded. Critical Value/emergent results were called by telephone at the time of interpretation on 03/02/2014 at 4:40 PM to Dr. Mikeal Hawthorne , who verbally acknowledged these results.   Electronically Signed   By: Marcello Moores  Register   On: 03/02/2014 16:43    Reviewed CTA  findings with patient and hospitalist who will admit for further evaluation, initiate of anticoagulation and outpatient followup.   Charles B. Karle Starch, MD 03/02/14 2328

## 2014-03-02 NOTE — Progress Notes (Signed)
ANTICOAGULATION CONSULT NOTE - Initial Consult  Pharmacy Consult for Xarelto  Indication: pulmonary embolus  No Known Allergies  Patient Measurements: Height: 5\' 9"  (175.3 cm) Weight: 402 lb 1.9 oz (182.4 kg) IBW/kg (Calculated) : 66.2  Vital Signs: Temp: 98.3 F (36.8 C) (03/25 1844) Temp src: Oral (03/25 1844) BP: 154/57 mmHg (03/25 1844) Pulse Rate: 78 (03/25 1844)  Labs:  Recent Labs  03/02/14 1333 03/02/14 1500 03/02/14 1649  HGB 9.7*  --   --   HCT 30.8*  --   --   PLT 277  --   --   APTT  --  33  --   LABPROT  --  14.1  --   INR  --  1.11  --   CREATININE 0.82  --   --   TROPONINI <0.30  --  <0.30    Estimated Creatinine Clearance: 154.1 ml/min (by C-G formula based on Cr of 0.82).   Medical History: Past Medical History  Diagnosis Date  . COPD (chronic obstructive pulmonary disease)   . Depression   . Hypertension   . Asthma   . Anxiety   . Anemia     Medications:  Scheduled:  . [START ON 03/03/2014] buPROPion  150 mg Oral Daily  . fluticasone  2 puff Inhalation BID  . lisinopril  20 mg Oral Daily   And  . hydrochlorothiazide  25 mg Oral Daily  . rivaroxaban  15 mg Oral BID WC   Followed by  . [START ON 03/23/2014] rivaroxaban  20 mg Oral Q supper  . [START ON 03/03/2014] simvastatin  10 mg Oral q1800  . sodium chloride  3 mL Intravenous Q12H  . sodium chloride  3 mL Intravenous Q12H    Assessment: 43 yo obese F with chest pain & worsening shortness of breath.  Chest CT cannot exclude tiny bilateral PE.  Renal function is good, No bleeding noted.   Goal of Therapy:  Monitor platelets by anticoagulation protocol: Yes   Plan:  Xarelto 15mg  po bid x 3 weeks then 20mg  po daily with FOOD.  Provide Xarelto education to patient  Biagio Borg 03/02/2014,7:44 PM

## 2014-03-02 NOTE — Progress Notes (Signed)
Called to get report on patient. Nurse off the unit.

## 2014-03-02 NOTE — ED Notes (Signed)
Pt to US.

## 2014-03-03 DIAGNOSIS — I2699 Other pulmonary embolism without acute cor pulmonale: Secondary | ICD-10-CM | POA: Diagnosis not present

## 2014-03-03 DIAGNOSIS — R0609 Other forms of dyspnea: Secondary | ICD-10-CM | POA: Diagnosis not present

## 2014-03-03 DIAGNOSIS — J45909 Unspecified asthma, uncomplicated: Secondary | ICD-10-CM | POA: Diagnosis not present

## 2014-03-03 DIAGNOSIS — I10 Essential (primary) hypertension: Secondary | ICD-10-CM | POA: Diagnosis not present

## 2014-03-03 LAB — CBC
HCT: 28.7 % — ABNORMAL LOW (ref 36.0–46.0)
Hemoglobin: 9.1 g/dL — ABNORMAL LOW (ref 12.0–15.0)
MCH: 25.7 pg — AB (ref 26.0–34.0)
MCHC: 31.7 g/dL (ref 30.0–36.0)
MCV: 81.1 fL (ref 78.0–100.0)
Platelets: 270 10*3/uL (ref 150–400)
RBC: 3.54 MIL/uL — ABNORMAL LOW (ref 3.87–5.11)
RDW: 14.2 % (ref 11.5–15.5)
WBC: 10 10*3/uL (ref 4.0–10.5)

## 2014-03-03 LAB — TROPONIN I
Troponin I: <0.3 ng/mL (ref <0.30)
Troponin I: <0.3 ng/mL (ref <0.30)

## 2014-03-03 MED ORDER — RIVAROXABAN 20 MG PO TABS: 20.0000 mg | ORAL_TABLET | Freq: Every day | ORAL | Status: DC

## 2014-03-03 MED ORDER — OXYCODONE HCL 5 MG PO TABS
5.0000 mg | ORAL_TABLET | Freq: Four times a day (QID) | ORAL | Status: DC | PRN
  Administered 2014-03-03: 5 mg via ORAL
  Filled 2014-03-03: qty 1

## 2014-03-03 MED ORDER — FLUTICASONE PROPIONATE HFA 110 MCG/ACT IN AERO: 2.0000 | INHALATION_SPRAY | Freq: Two times a day (BID) | RESPIRATORY_TRACT | Status: DC

## 2014-03-03 MED ORDER — LISINOPRIL 40 MG PO TABS: 40.0000 mg | ORAL_TABLET | Freq: Every day | ORAL | Status: DC

## 2014-03-03 MED ORDER — BUPROPION HCL ER (XL) 150 MG PO TB24: 150.0000 mg | ORAL_TABLET | Freq: Every day | ORAL | Status: DC

## 2014-03-03 MED ORDER — ALBUTEROL SULFATE (2.5 MG/3ML) 0.083% IN NEBU: 2.5000 mg | INHALATION_SOLUTION | Freq: Four times a day (QID) | RESPIRATORY_TRACT | Status: DC | PRN

## 2014-03-03 MED ORDER — HYDROCHLOROTHIAZIDE 25 MG PO TABS: 25.0000 mg | ORAL_TABLET | Freq: Every day | ORAL | Status: DC

## 2014-03-03 MED ORDER — RIVAROXABAN 15 MG PO TABS: 15.0000 mg | ORAL_TABLET | Freq: Two times a day (BID) | ORAL | Status: DC

## 2014-03-03 MED ORDER — PRAVASTATIN SODIUM 20 MG PO TABS: 20.0000 mg | ORAL_TABLET | Freq: Every day | ORAL | Status: DC

## 2014-03-03 NOTE — Evaluation (Signed)
Physical Therapy Evaluation Patient Details Name: Erin Sexton MRN: [id number] DOB: 1971-04-29 Today's Date: 03/03/2014   History of Present Illness  Pt is a morbidly obese female who is admitted with  pulmonary emboli.  She is recently separated from her husband and has moved her from [place] to live with her mother.  She is normally independent with gait and all ADLs.  Clinical Impression   Pt was seen for evaluation.  She is very pleasant and cooperative with no significant c/o.  She is independent with all mobility.  She was on 1/5 L O2 at the time of my arrival.  Her O2 sats were checked on RA at rest and after exertion.  Sats remained at 98-99% at all times.    Follow Up Recommendations No PT follow up    Equipment Recommendations  Hospital bed (per pt request)    Recommendations for Other Services   none    Precautions / Restrictions Precautions Precautions: None Restrictions Weight Bearing Restrictions: No      Mobility  Bed Mobility Overal bed mobility: Independent                Transfers Overall transfer level: Independent Equipment used: None                Ambulation/Gait Ambulation/Gait assistance: Independent Ambulation Distance (Feet): 100 Feet Assistive device: None Gait Pattern/deviations: WFL(Within Functional Limits)   Gait velocity interpretation: at or above normal speed for age/gender    Stairs            Wheelchair Mobility    Modified Rankin (Stroke Patients Only)       Balance Overall balance assessment: Independent                                     Home Living Family/patient expects to be discharged to:: Private residence Living Arrangements: Parent Available Help at Discharge: Family;Available 24 hours/day Type of Home: House Home Access: Stairs to enter Entrance Stairs-Rails: Right Entrance Stairs-Number of Steps: 4 Home Layout: One level Home Equipment: None Additional  Comments: pt requests a hospital bed as she has to sleep in a recliner at home in order to be able to breathe.    Prior Function Level of Independence: Independent                       Extremity/Trunk Assessment               Lower Extremity Assessment: Overall WFL for tasks assessed         Communication   Communication: No difficulties  Cognition Arousal/Alertness: Awake/alert Behavior During Therapy: WFL for tasks assessed/performed Overall Cognitive Status: Within Functional Limits for tasks assessed                                    Assessment/Plan    PT Assessment Patent does not need any further PT services  PT Diagnosis     PT Problem List    PT Treatment Interventions     PT Goals (Current goals can be found in the Care Plan section) Acute Rehab PT Goals PT Goal Formulation: No goals set, d/c therapy         Barriers to discharge  none      End of Session   Activity Tolerance:  Patient tolerated treatment well Patient left: in bed;with family/visitor present    Functional Assessment Tool Used: clinical judgement Functional Limitation: Mobility: Walking and moving around Mobility: Walking and Moving Around Current Status [phone]): 0 percent impaired, limited or restricted Mobility: Walking and Moving Around Goal Status [phone]): 0 percent impaired, limited or restricted Mobility: Walking and Moving Around Discharge Status [phone]): 0 percent impaired, limited or restricted    Time: 0737-1062 PT Time Calculation (min): 27 min   Charges:   PT Evaluation $Initial PT Evaluation Tier I: 1 Procedure     PT G Codes:   Functional Assessment Tool Used: clinical judgement Functional Limitation: Mobility: Walking and moving around    [place], Cres[place] L 03/03/2014, 9:15 AM

## 2014-03-03 NOTE — Care Management Note (Unsigned)
    Page 1 of 1   03/03/2014     1:48:00 PM   CARE MANAGEMENT NOTE 03/03/2014  Patient:  Erin Sexton, Erin Sexton   Account Number:  [id number]  Date Initiated:  03/03/2014  Documentation initiated by:  Claretha Cooper  Subjective/Objective Assessment:   Pt admitted from her daughter, Jeanette's hme. Pt recently moved from [facility] and living with daughter. No PCP in [place].   Pt also requested a hospital bed since her [facility] needs to be elevated due to chronic asthma and     Action/Plan:   obesity causes obstruction. Dr. Roderic Palau has arranged for Dr. Donnalee Curry to be PCP   Anticipated DC Date:  03/03/2014   Anticipated DC Plan:  [place]  CM consult      Choice offered to / List presented to:             Status of service:   Medicare Important Message given?   (If response is "NO", the following Medicare IM given date fields will be blank) Date Medicare IM given:   Date Additional Medicare IM given:    Discharge Disposition:    Per UR Regulation:    If discussed at Long Length of Stay Meetings, dates discussed:    Comments:  03/03/14 Ritaj Dullea Dellia Nims RN BSN CM CM called and Dr. Lanice Shirts office is closed on Thursday afternoon. No appt made yet.

## 2014-03-03 NOTE — Discharge Instructions (Signed)
Follow up with a family doctor next week.    The prednisone should help with your rash and breathing.  Information on my medicine - XARELTO (rivaroxaban)  This medication education was reviewed with me or my healthcare representative as part of my discharge preparation.  The pharmacist that spoke with me during my hospital stay was:  Biagio Borg, [place]? Xarelto was prescribed to treat blood clots that may have been found in the veins of your legs (deep vein thrombosis) or in your lungs (pulmonary embolism) and to reduce the risk of them occurring again.  What do you need to know about Xarelto? The starting dose is one 15 mg tablet taken TWICE daily with food for the FIRST 21 DAYS then on 03/23/14  the dose is changed to one 20 mg tablet taken ONCE A DAY with your evening meal.  DO NOT stop taking Xarelto without talking to the health care provider who prescribed the medication.  Refill your prescription for 20 mg tablets before you run out.  After discharge, you should have regular check-up appointments with your healthcare provider that is prescribing your Xarelto.  In the future your dose may need to be changed if your kidney function changes by a significant amount.  What do you do if you miss a dose? If you are taking Xarelto TWICE DAILY and you miss a dose, take it as soon as you remember. You may take two 15 mg tablets (total 30 mg) at the same time then resume your regularly scheduled 15 mg twice daily the next day.  If you are taking Xarelto ONCE DAILY and you miss a dose, take it as soon as you remember on the same day then continue your regularly scheduled once daily regimen the next day. Do not take two doses of Xarelto at the same time.   Important Safety Information Xarelto is a blood thinner medicine that can cause bleeding. You should call your healthcare provider right away if you experience any of the following:   Bleeding from an injury or your nose that does not stop.   Unusual colored urine (red or dark brown) or unusual colored stools (red or black).   Unusual bruising for unknown reasons.   A serious fall or if you hit your head (even if there is no bleeding).  Some medicines may interact with Xarelto and might increase your risk of bleeding while on Xarelto. To help avoid this, consult your healthcare provider or pharmacist prior to using any new prescription or non-prescription medications, including herbals, vitamins, non-steroidal anti-inflammatory drugs (NSAIDs) and supplements.  This website has more information on Xarelto: [email or web address]

## 2014-03-03 NOTE — Progress Notes (Signed)
Resting on 2 liters: 100% Resting on RA: 98% Ambulating on RA: 96%

## 2014-03-03 NOTE — Discharge Summary (Signed)
Physician Discharge Summary  Erin Sexton PYP:[id number] DOB: 06/16/1971 DOA: 03/02/2014  PCP: No primary provider on file.  Admit date: 03/02/2014 Discharge date: 03/03/2014  Time spent:73minutes  Recommendations for Outpatient Follow-up:  1. Patient has received a prescription for 21 day supply of Xarelto. She will need to followup with her primary care physician for further medications. 2. Please obtain outpatient hypercoagulable panel. 3. Outpatient pulmonary function tests/sleep study.  Discharge Diagnoses:  Principal Problem:   Pulmonary embolism Active Problems:   Morbid obesity   Essential hypertension, benign   Asthma, chronic   Dyspnea   Pulmonary embolus   Discharge Condition: improved  Diet recommendation: low salt  Filed Weights   03/02/14 1228 03/02/14 1844 03/03/14 0431  Weight: 182.544 kg (402 lb 7 oz) 182.4 kg (402 lb 1.9 oz) 179.806 kg (396 lb 6.4 oz)    History of present illness:  Erin Sexton is a 43 y.o. female history of hypertension, morbid obesity since the emergency room with complaints of shortness of breath and chest pressure. She describes substernal chest pressure and shortness of breath occurring over the past week, progressively getting worse. She has dyspnea on exertion. She feels that her legs have been swelling, but attributes this to not taking her blood pressure medications. Patient recently moved to [place] from [place] and has not established primary care here. She denies being on any hormonal treatments. She does not have any family history of VTE. She's not had any recent surgeries or other prolonged immobilization. She does describe herself as sedentary. She was evaluated in the emergency room where CT scan of the chest indicated possible small bilateral pulmonary emboli. She'll be admitted for further treatments.   Hospital Course:  This patient was admitted to the hospital with shortness of breath and chest pressure. CT  scan of the chest indicated possible small bilateral pulmonary emboli. The patient was started on Xarelto for anticoagulation. She did not have any hypoxia. She was able to ambulate maintain oxygen saturations. Lower extremity Dopplers are negative for DVT. She'll need 3-6 months of treatment. The patient also has underlying asthma/COPD. She is continued on albuterol MDIs/nebulizers. We will also start Flovent. She would benefit from pulmonary function tests as an outpatient.  Procedures:    Consultations:    Discharge Exam: Filed Vitals:   03/03/14 1018  BP: 162/85  Pulse:   Temp:   Resp:     General: NAD Cardiovascular: S1, S2 RRR Respiratory: CTA B  Discharge Instructions  Discharge Orders   Future Orders Complete By Expires   Call MD for:  difficulty breathing, headache or visual disturbances  As directed    Call MD for:  temperature >100.4  As directed    Diet - low sodium heart healthy  As directed    Increase activity slowly  As directed        Medication List    STOP taking these medications       lisinopril-hydrochlorothiazide 20-25 MG per tablet  Commonly known as:  PRINZIDE,ZESTORETIC      TAKE these medications       albuterol 108 (90 BASE) MCG/ACT inhaler  Commonly known as:  PROVENTIL HFA;VENTOLIN HFA  Inhale 2 puffs into the lungs every 6 (six) hours as needed for wheezing or shortness of breath.     albuterol (2.5 MG/3ML) 0.083% nebulizer solution  Commonly known as:  PROVENTIL  Take 3 mLs (2.5 mg total) by nebulization every 6 (six) hours as needed for wheezing or shortness of  breath.     buPROPion 150 MG 24 hr tablet  Commonly known as:  WELLBUTRIN XL  Take 1 tablet (150 mg total) by mouth daily.     fluticasone 110 MCG/ACT inhaler  Commonly known as:  FLOVENT HFA  Inhale 2 puffs into the lungs 2 (two) times daily.     hydrochlorothiazide 25 MG tablet  Commonly known as:  HYDRODIURIL  Take 1 tablet (25 mg total) by mouth daily.      ibuprofen 200 MG tablet  Commonly known as:  ADVIL,MOTRIN  Take 600 mg by mouth every 6 (six) hours as needed for headache.     lisinopril 40 MG tablet  Commonly known as:  PRINIVIL,ZESTRIL  Take 1 tablet (40 mg total) by mouth daily.     pravastatin 20 MG tablet  Commonly known as:  PRAVACHOL  Take 1 tablet (20 mg total) by mouth daily.     Rivaroxaban 15 MG Tabs tablet  Commonly known as:  XARELTO  Take 1 tablet (15 mg total) by mouth 2 (two) times daily with a meal. For 21 days     Rivaroxaban 20 MG Tabs tablet  Commonly known as:  XARELTO  Take 1 tablet (20 mg total) by mouth daily with supper. To be started once 15mg  tablets are complete       No Known Allergies     Follow-up Information   Follow up with Doree Albee, MD. Schedule an appointment as soon as possible for a visit in 1 week. (call office to make appt. Dr. Anastasio Champion has agreed to accept you as a new pt.)    Specialty:  Internal Medicine   Contact information:   [place] [address] [phone]        The results of significant diagnostics from this hospitalization (including imaging, microbiology, ancillary and laboratory) are listed below for reference.    Significant Diagnostic Studies: Dg Chest 2 View  03/02/2014   CLINICAL DATA:  Short of breath for few days.  History of asthma.  EXAM: CHEST  2 VIEW  COMPARISON:  DG CHEST 2 VIEW dated 01/30/2007  FINDINGS: Lateral view degraded by patient arm position. Lateral view is also a minimally motion degraded. Moderate thoracic spondylosis. Midline trachea. Borderline cardiomegaly. Mediastinal contours otherwise within normal limits. No pleural effusion or pneumothorax. Clear lungs.  IMPRESSION: Borderline cardiomegaly, without acute disease.   Electronically Signed   By: Abigail Miyamoto M.D.   On: 03/02/2014 13:20   Ct Angio Chest Pe W/cm &/or Wo Cm  03/02/2014   CLINICAL DATA:  Shortness of breath.  EXAM: CT ANGIOGRAPHY CHEST WITH CONTRAST   TECHNIQUE: Multidetector CT imaging of the chest was performed using the standard protocol during bolus administration of intravenous contrast. Multiplanar CT image reconstructions and MIPs were obtained to evaluate the vascular anatomy.  CONTRAST:  172mL OMNIPAQUE IOHEXOL 350 MG/ML SOLN  COMPARISON:  DG CHEST 2 VIEW dated 03/02/2014  FINDINGS: Thoracic aorta is unremarkable. 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 The this can occasionally result in dysphagia. This is a normal variant. Pulmonary arteries are difficult to evaluate due to patient size and phase of enhancement. Tiny bilateral pulmonary emboli cannot be excluded. No major pulmonary artery emboli are present. Cardiomegaly. Coronary artery disease cannot be excluded. No pericardial effusion.  Shotty mediastinal lymph nodes. No pathologic adenopathy is CT size criteria. Thoracic esophagus is unremarkable. Adrenals unremarkable. Visualized upper abdominal viscera unremarkable.  Large airways patent. Mild pleural  thickening noted consistent scarring. Mild basilar atelectasis.  Thyroid normal size. No significant axillary adenopathy. Chest wall is intact. Degenerative changes lumbar spine. No acute bony abnormality.  Review of the MIP images confirms the above findings.  IMPRESSION: 1. Limited study. Cannot exclude very tiny bilateral pulmonary emboli. 2. Aberrant right subclavian artery, this is a normal finding. This can result and mild esophageal compression and dysplasia. 3. Cardiomegaly. Coronary artery disease cannot be excluded. Critical Value/emergent results were called by telephone at the time of interpretation on 03/02/2014 at 4:40 PM to Dr. Mikeal Hawthorne , who verbally acknowledged these results.   Electronically Signed   By: Marcello Moores  Register   On: 03/02/2014 16:43   US Venous Img Lower Bilateral  03/02/2014   CLINICAL DATA:  Equivocal chest CTA  EXAM: Bilateral LOWER EXTREMITY VENOUS DOPPLER ULTRASOUND   TECHNIQUE: Gray-scale sonography with graded compression, as well as color Doppler and duplex ultrasound, were performed to evaluate the deep venous system from the level of the common femoral vein through the popliteal and proximal calf veins. Spectral Doppler was utilized to evaluate flow at rest and with distal augmentation maneuvers.  COMPARISON:  None.  FINDINGS: Thrombus within deep veins:  None visualized.  Compressibility of deep veins:  Normal.  Duplex waveform respiratory phasicity:  Normal.  Duplex waveform response to augmentation:  Normal.  Venous reflux:  None visualized.  Other findings:  None visualized.  IMPRESSION: No deep venous thrombosis is noted bilaterally.   Electronically Signed   By: Inez Catalina M.D.   On: 03/02/2014 17:59    Microbiology: No results found for this or any previous visit (from the past 240 hour(s)).   Labs: Basic Metabolic Panel:  Recent Labs Lab 03/02/14 1333  NA 143  K 4.3  CL 107  CO2 28  GLUCOSE 113*  BUN 15  CREATININE 0.82  CALCIUM 8.6   Liver Function Tests:  Recent Labs Lab 03/02/14 1333  AST 28  ALT 32  ALKPHOS 91  BILITOT 0.3  PROT 7.1  ALBUMIN 3.3*   No results found for this basename: LIPASE, AMYLASE,  in the last 168 hours No results found for this basename: AMMONIA,  in the last 168 hours CBC:  Recent Labs Lab 03/02/14 1333 03/03/14 0200  WBC 9.7 10.0  NEUTROABS 7.0  --   HGB 9.7* 9.1*  HCT 30.8* 28.7*  MCV 81.3 81.1  PLT 277 270   Cardiac Enzymes:  Recent Labs Lab 03/02/14 1333 03/02/14 1649 03/02/14 1941 03/03/14 0200 03/03/14 0731  TROPONINI <0.30 <0.30 <0.30 <0.30 <0.30   BNP: BNP (last 3 results)  Recent Labs  03/02/14 1333  PROBNP 113.7   CBG: No results found for this basename: GLUCAP,  in the last 168 hours     Signed:  Joetta Delprado  [facility] 03/03/2014, 7:40 PM

## 2014-03-03 NOTE — Progress Notes (Signed)
Patient discharged home with prescriptions. IV cath removed and intact. No pain/swelling at site. Patient waiting for transport out.

## 2014-03-04 ENCOUNTER — Encounter (HOSPITAL_COMMUNITY): Payer: Self-pay | Admitting: Emergency Medicine

## 2014-03-04 ENCOUNTER — Emergency Department (HOSPITAL_COMMUNITY)
Admission: EM | Admit: 2014-03-04 | Discharge: 2014-03-04 | Disposition: A | Discharge status: Home / Self Care | Payer: Medicare Other | Attending: Emergency Medicine | Admitting: Emergency Medicine

## 2014-03-04 DIAGNOSIS — F3289 Other specified depressive episodes: Secondary | ICD-10-CM | POA: Insufficient documentation

## 2014-03-04 DIAGNOSIS — Z86711 Personal history of pulmonary embolism: Secondary | ICD-10-CM | POA: Insufficient documentation

## 2014-03-04 DIAGNOSIS — J4489 Other specified chronic obstructive pulmonary disease: Secondary | ICD-10-CM | POA: Insufficient documentation

## 2014-03-04 DIAGNOSIS — I10 Essential (primary) hypertension: Secondary | ICD-10-CM | POA: Diagnosis not present

## 2014-03-04 DIAGNOSIS — F411 Generalized anxiety disorder: Secondary | ICD-10-CM | POA: Insufficient documentation

## 2014-03-04 DIAGNOSIS — Z79899 Other long term (current) drug therapy: Secondary | ICD-10-CM | POA: Insufficient documentation

## 2014-03-04 DIAGNOSIS — Z8679 Personal history of other diseases of the circulatory system: Secondary | ICD-10-CM | POA: Insufficient documentation

## 2014-03-04 DIAGNOSIS — D689 Coagulation defect, unspecified: Secondary | ICD-10-CM | POA: Insufficient documentation

## 2014-03-04 DIAGNOSIS — R079 Chest pain, unspecified: Secondary | ICD-10-CM | POA: Diagnosis not present

## 2014-03-04 DIAGNOSIS — N898 Other specified noninflammatory disorders of vagina: Secondary | ICD-10-CM | POA: Diagnosis not present

## 2014-03-04 DIAGNOSIS — F329 Major depressive disorder, single episode, unspecified: Secondary | ICD-10-CM | POA: Insufficient documentation

## 2014-03-04 DIAGNOSIS — J449 Chronic obstructive pulmonary disease, unspecified: Secondary | ICD-10-CM | POA: Insufficient documentation

## 2014-03-04 DIAGNOSIS — Z7901 Long term (current) use of anticoagulants: Secondary | ICD-10-CM | POA: Insufficient documentation

## 2014-03-04 DIAGNOSIS — I2699 Other pulmonary embolism without acute cor pulmonale: Secondary | ICD-10-CM | POA: Diagnosis not present

## 2014-03-04 DIAGNOSIS — Z862 Personal history of diseases of the blood and blood-forming organs and certain disorders involving the immune mechanism: Secondary | ICD-10-CM | POA: Insufficient documentation

## 2014-03-04 DIAGNOSIS — D68318 Other hemorrhagic disorder due to intrinsic circulating anticoagulants, antibodies, or inhibitors: Secondary | ICD-10-CM | POA: Diagnosis not present

## 2014-03-04 DIAGNOSIS — N939 Abnormal uterine and vaginal bleeding, unspecified: Secondary | ICD-10-CM

## 2014-03-04 DIAGNOSIS — R0602 Shortness of breath: Secondary | ICD-10-CM | POA: Diagnosis not present

## 2014-03-04 HISTORY — DX: Encounter for other specified aftercare: Z51.89

## 2014-03-04 LAB — PROTIME-INR
INR: 1.66 — ABNORMAL HIGH (ref 0.00–1.49)
Prothrombin Time: 19.1 seconds — ABNORMAL HIGH (ref 11.6–15.2)

## 2014-03-04 LAB — BASIC METABOLIC PANEL
BUN: 17 mg/dL (ref 6–23)
CHLORIDE: 102 meq/L (ref 96–112)
CO2: 30 mEq/L (ref 19–32)
CREATININE: 0.83 mg/dL (ref 0.50–1.10)
Calcium: 8.9 mg/dL (ref 8.4–10.5)
GFR calc Af Amer: >90 mL/min (ref >90)
GFR calc non Af Amer: 84 mL/min — ABNORMAL LOW (ref >90)
Glucose, Bld: 149 mg/dL — ABNORMAL HIGH (ref 70–99)
Potassium: 4 mEq/L (ref 3.7–5.3)
Sodium: 143 mEq/L (ref 137–147)

## 2014-03-04 LAB — CBC WITH DIFFERENTIAL/PLATELET
BASOS PCT: 0 % (ref 0–1)
Basophils Absolute: 0 10*3/uL (ref 0.0–0.1)
Eosinophils Absolute: 0.4 10*3/uL (ref 0.0–0.7)
Eosinophils Relative: 4 % (ref 0–5)
HEMATOCRIT: 31.6 % — AB (ref 36.0–46.0)
Hemoglobin: 9.9 g/dL — ABNORMAL LOW (ref 12.0–15.0)
Lymphocytes Relative: 19 % (ref 12–46)
Lymphs Abs: 1.9 10*3/uL (ref 0.7–4.0)
MCH: 25.6 pg — ABNORMAL LOW (ref 26.0–34.0)
MCHC: 31.3 g/dL (ref 30.0–36.0)
MCV: 81.7 fL (ref 78.0–100.0)
MONO ABS: 0.4 10*3/uL (ref 0.1–1.0)
MONOS PCT: 4 % (ref 3–12)
Neutro Abs: 7.2 10*3/uL (ref 1.7–7.7)
Neutrophils Relative %: 73 % (ref 43–77)
Platelets: 411 10*3/uL — ABNORMAL HIGH (ref 150–400)
RBC: 3.87 MIL/uL (ref 3.87–5.11)
RDW: 14.5 % (ref 11.5–15.5)
WBC: 9.8 10*3/uL (ref 4.0–10.5)

## 2014-03-04 LAB — TYPE AND SCREEN
ABO/RH(D): O POS
Antibody Screen: NEGATIVE

## 2014-03-04 LAB — APTT: aPTT: 42 seconds — ABNORMAL HIGH (ref 24–37)

## 2014-03-04 MED ORDER — MEGESTROL ACETATE 40 MG PO TABS: 40.0000 mg | ORAL_TABLET | Freq: Two times a day (BID) | ORAL | Status: DC

## 2014-03-04 MED ORDER — MEGESTROL ACETATE 40 MG PO TABS
40.0000 mg | ORAL_TABLET | Freq: Two times a day (BID) | ORAL | Status: DC
  Administered 2014-03-04: 40 mg via ORAL
  Filled 2014-03-04 (×5): qty 1

## 2014-03-04 MED ORDER — SODIUM CHLORIDE 0.9 % IV BOLUS (SEPSIS)
1000.0000 mL | Freq: Once | INTRAVENOUS | Status: AC
Start: 2014-03-04 — End: 2014-03-04
  Administered 2014-03-04: 1000 mL via INTRAVENOUS

## 2014-03-04 NOTE — Discharge Instructions (Signed)
Dysfunctional Uterine Bleeding Normally, menstrual periods begin between ages 16 to 68 in young women. A normal menstrual cycle/period may begin every 23 days up to 35 days and lasts from 1 to 7 days. Around 12 to 14 days before your menstrual period starts, ovulation (ovary produces an egg) occurs. When counting the time between menstrual periods, count from the first day of bleeding of the previous period to the first day of bleeding of the next period. Dysfunctional (abnormal) uterine bleeding is bleeding that is different from a normal menstrual period. Your periods may come earlier or later than usual. They may be lighter, have blood clots or be heavier. You may have bleeding between periods, or you may skip one period or more. You may have bleeding after sexual intercourse, bleeding after menopause, or no menstrual period. CAUSES   Pregnancy (normal, miscarriage, tubal).  IUDs (intrauterine device, birth control).  Birth control pills.  Hormone treatment.  Menopause.  Infection of the cervix.  Blood clotting problems.  Infection of the inside lining of the uterus.  Endometriosis, inside lining of the uterus growing in the pelvis and other female organs.  Adhesions (scar tissue) inside the uterus.  Obesity or severe weight loss.  Uterine polyps inside the uterus.  Cancer of the vagina, cervix, or uterus.  Ovarian cysts or polycystic ovary syndrome.  Medical problems (diabetes, thyroid disease).  Uterine fibroids (noncancerous tumor).  Problems with your female hormones.  Endometrial hyperplasia, very thick lining and enlarged cells inside of the uterus.  Medicines that interfere with ovulation.  Radiation to the pelvis or abdomen.  Chemotherapy. DIAGNOSIS   Your doctor will discuss the history of your menstrual periods, medicines you are taking, changes in your weight, stress in your life, and any medical problems you may have.  Your doctor will do a physical  and pelvic examination.  Your doctor may want to perform certain tests to make a diagnosis, such as:  Pap test.  Blood tests.  Cultures for infection.  CT scan.  Ultrasound.  Hysteroscopy.  Laparoscopy.  MRI.  Hysterosalpingography.  D and C.  Endometrial biopsy. TREATMENT  Treatment will depend on the cause of the dysfunctional uterine bleeding (DUB). Treatment may include:  Observing your menstrual periods for a couple of months.  Prescribing medicines for medical problems, including:  Antibiotics.  Hormones.  Birth control pills.  Removing an IUD (intrauterine device, birth control).  Surgery:  D and C (scrape and remove tissue from inside the uterus).  Laparoscopy (examine inside the abdomen with a lighted tube).  Uterine ablation (destroy lining of the uterus with electrical current, laser, heat, or freezing).  Hysteroscopy (examine cervix and uterus with a lighted tube).  Hysterectomy (remove the uterus). HOME CARE INSTRUCTIONS   If medicines were prescribed, take exactly as directed. Do not change or switch medicines without consulting your caregiver.  Long term heavy bleeding may result in iron deficiency. Your caregiver may have prescribed iron pills. They help replace the iron that your body lost from heavy bleeding. Take exactly as directed.  Do not take aspirin or medicines that contain aspirin one week before or during your menstrual period. Aspirin may make the bleeding worse.  If you need to change your sanitary pad or tampon more than once every 2 hours, stay in bed with your feet elevated and a cold pack on your lower abdomen. Rest as much as possible, until the bleeding stops or slows down.  Eat well-balanced meals. Eat foods high in iron. Examples  are:  Leafy green vegetables.  Whole-grain breads and cereals.  Eggs.  Meat.  Liver.  Do not try to lose weight until the abnormal bleeding has stopped and your blood iron level is  back to normal. Do not lift more than ten pounds or do strenuous activities when you are bleeding.  For a couple of months, make note on your calendar, marking the start and ending of your period, and the type of bleeding (light, medium, heavy, spotting, clots or missed periods). This is for your caregiver to better evaluate your problem. SEEK MEDICAL CARE IF:   You develop nausea (feeling sick to your stomach) and vomiting, dizziness, or diarrhea while you are taking your medicine.  You are getting lightheaded or weak.  You have any problems that may be related to the medicine you are taking.  You develop pain with your DUB.  You want to remove your IUD.  You want to stop or change your birth control pills or hormones.  You have any type of abnormal bleeding mentioned above.  You are over 44 years old and have not had a menstrual period yet.  You are 43 years old and you are still having menstrual periods.  You have any of the symptoms mentioned above.  You develop a rash. SEEK IMMEDIATE MEDICAL CARE IF:   An oral temperature above 102 F (38.9 C) develops.  You develop chills.  You are changing your sanitary pad or tampon more than once an hour.  You develop abdominal pain.  You pass out or faint. Document Released: 11/22/2000 Document Revised: 02/17/2012 Document Reviewed: 10/24/2009 [facility] Patient Information 2014 [place].  Anticoagulation, Generic Anticoagulants are medications used to prevent clots from developing in your veins. These medications are also known as blood thinners. If blood clots are untreated, they could travel to your lungs. This is called a pulmonary embolus. A blood clot in your lungs can be fatal.  Caregivers often use anticoagulants to prevent clots following surgery. Anticoagulants are also used along with aspirin when the heart is not getting enough blood. Another anticoagulant called warfarin is started 2 to 3 days after a  rapid-acting injectable anticoagulant is started. The rapid-acting anticoagulants are usually continued until warfarin has begun to work. Your caregiver will judge this length of time by blood tests known as the prothrombin time (PT) and International Normalization Ratio (INR). This means that your blood is at the necessary and best level to prevent clots. RISKS AND COMPLICATIONS  If you have received recent epidural anesthesia, spinal anesthesia, or a spinal tap while receiving anticoagulants, you are at risk for developing a blood clot in or around the spine. This condition could result in long-term or permanent paralysis.  Because anticoagulants thin your blood, severe bleeding may occur from any tissue or organ. Symptoms of the blood being too thin may include:  Bleeding from the nose or gums that does not stop quickly.  Unusual bruising or bruising easily.  Swelling or pain at an injection site.  A cut that does not stop bleeding within 10 minutes.  Continual nausea for more than 1 day or vomiting blood.  Coughing up blood.  Blood in the urine which may appear as pink, red, or brown urine.  Blood in bowel movements which may appear as red, dark or black stools.  Sudden weakness or numbness of the face, arm, or leg, especially on one side of the body.  Sudden confusion.  Trouble speaking (aphasia) or understanding.  Sudden trouble  seeing in one or both eyes.  Sudden trouble walking.  Dizziness.  Loss of balance or coordination.  Severe pain, such as a headache, joint pain, or back pain.  Fever.  Too little anticoagulation continues to allow the risk for blood clots. HOME CARE INSTRUCTIONS   Due to the complications of anticoagulants, it is very important that you take your anticoagulant as directed by your caregiver. Anticoagulants need to be taken exactly as instructed. Be sure you understand all your anticoagulant instructions.  Warfarin. Your caregiver will advise  you on the length of treatment (usually 3 6 months, sometimes lifelong).  Take warfarin exactly as directed by your caregiver. It is recommended that you take your warfarin dose at the same time of the day. It is preferred that you take warfarin in the late afternoon. If you have been told to stop taking warfarin, do not resume taking warfarin until directed to do so by your caregiver. Follow your caregiver's instructions if you accidentally take an extra dose or miss a dose of warfarin. It is very important to take warfarin as directed since bleeding or blood clots could result in chronic or permanent injury, pain, or disability.  Too much and too little warfarin are both dangerous. Too much warfarin increases the risk of bleeding. Too little warfarin continues to allow the risk for blood clots. While taking warfarin, you will need to have regular blood tests to measure your blood clotting time. These blood tests usually include both the PT and INR tests. The PT and INR results allow your caregiver to adjust your dose of warfarin. The dose can change for many reasons. It is critically important that you take warfarin exactly as prescribed, and that you have your PT and INR levels drawn exactly as directed. Follow up with your laboratory test appointments as directed. It is very important to keep your lab appointments. Not keeping lab appointments could result in a chronic or permanent injury, pain, or disability.  Many foods, especially foods high in vitamin K can interfere with warfarin and affect the PT and INR results. Foods high in vitamin K include spinach, kale, broccoli, cabbage, collard and turnip greens, brussels sprouts, peas, cauliflower, seaweed, and parsley as well as beef and pork liver, green tea, and soybean oil. You should eat a consistent amount of foods high in vitamin K. Avoid major changes in your diet, or notify your caregiver before changing your diet. Arrange a visit with a dietitian  to answer your questions.  Many medicines can interfere with warfarin and affect the PT and INR results. You must tell your caregiver about any and all medicines you take, this includes all vitamins and supplements. Ask your caregiver before taking these. Prescription and over-the-counter medicine consistency is critical to warfarin management. It is important that potential interactions are checked before you start a new medicine. Be especially cautious with aspirin and anti-inflammatory medicines. Ask your caregiver before taking these. Medicines such as antibiotics and acid-reducing medicine can interact with warfarin and can cause an increased warfarin effect. Warfarin can also interfere with the effectiveness of medicines you are taking. Do not take or discontinue any prescribed or over-the-counter medicine except on the advice of your caregiver or pharmacist.  Some vitamins, supplements, and herbal products interfere with the effectiveness of warfarin. Vitamin E may increase the anticoagulant effects of warfarin. Vitamin K may can cause warfarin to be less effective. Do not take or discontinue any vitamin, supplement, or herbal product except on the advice  of your caregiver or pharmacist.  Alcohol can change the body's ability to handle warfarin. It is best to avoid alcoholic drinks or consume only very small amounts while taking warfarin. Notify your caregiver if you change your alcohol intake. A sudden increase in alcohol use can increase your risk of bleeding. Chronic alcohol use can cause warfarin to be less effective.  If you have a loss of appetite or get the stomach flu (viral gastroenteritis), talk to your caregiver as soon as possible. A decrease in your normal vitamin K intake can make you more sensitive to your usual dose of warfarin.  Some medical conditions may increase your risk for bleeding while you are taking warfarin. A fever, diarrhea lasting more than a day, worsening heart  failure, or worsening liver function are some medical conditions that could affect warfarin. Contact your caregiver if you have any of these medical conditions.  Warfarin can have side effects, such as excessive bruising or bleeding. You will need to hold pressure over cuts for longer than usual.  Be careful not to cut yourself when using sharp objects.  Notify your dentist or other caregivers before procedures.  Limit physical activities or sports that could result in a fall or cause injury. Avoid contact sports.  Wear a medical alert bracelet or carry a medical alert card. SEEK MEDICAL CARE IF:   You develop any rashes.  You have any worsening of the condition for which you are receiving anticoagulation therapy. SEEK IMMEDIATE MEDICAL CARE IF:   Bleeding from the nose or gums does not stop quickly.  You have unusual bruising or are bruising easily.  Swelling or pain occurs at an injection site.  A cut does not stop bleeding within 10 minutes.  You have continual nausea for more than 1 day or are vomiting blood.  You are coughing up blood.  You have blood in the urine.  You have dark or black stools.  You have sudden weakness or numbness of the face, arm, or leg, especially on one side of the body.  You have sudden confusion.  You have trouble speaking (aphasia) or understanding.  You have sudden trouble seeing in one or both eyes.  You have sudden trouble walking.  You have dizziness.  You have a loss of balance or coordination.  You have severe pain, such as a headache, joint pain, or back pain.  You have a serious fall or head injury, even if you are not bleeding.  You have an oral temperature above 102 F (38.9 C), not controlled by medicine. ANY OF THESE SYMPTOMS MAY REPRESENT A SERIOUS PROBLEM THAT IS AN EMERGENCY. Do not wait to see if the symptoms will go away. Get medical help right away. Call your local emergency services (911 in [place] DO NOT drive  yourself to the hospital. MAKE SURE YOU:   Understand these instructions.  Will watch your condition.  Will get help right away if you are not doing well or get worse. Document Released: 11/25/2005 Document Revised: 08/19/2012 Document Reviewed: 06/29/2008 [facility] Patient Information 2014 [place].

## 2014-03-04 NOTE — ED Notes (Addendum)
Pt was recent adm with PE, d/c yesterday and began having vaginal bleeding that she says is very heavy, using10 pads in 1 hour.  No abd cramping.  Pt is taking xaralto  Pt has dried blood on lips, and says she had blood coming from her nose.

## 2014-03-04 NOTE — ED Provider Notes (Signed)
CSN: [id number]     Arrival date & time 03/04/14  1503 History   First MD Initiated Contact with Patient 03/04/14 1536     Chief Complaint  Patient presents with  . Vaginal Bleeding     (Consider location/radiation/quality/duration/timing/severity/associated sxs/prior Treatment) Patient is a 43 y.o. female presenting with vaginal bleeding.  Vaginal Bleeding  Pt with recently diagnosed PE started on Xarelto and discharged yesterday reports onset of vaginal bleeding at the expected time for menses but has been particularly severe. States she has been using 10 pads per hour at times. Also had small amount of nose bleeding which has stopped. She reports some lightheaded and dizziness but no loss of consciousness. She had transfusion for menorrhagia about 10 years ago but has been doing well since.   Past Medical History  Diagnosis Date  . COPD (chronic obstructive pulmonary disease)   . Depression   . Hypertension   . Asthma   . Anxiety   . Anemia   . Pulmonary emboli   . Blood transfusion without reported diagnosis    History reviewed. No pertinent past surgical history. History reviewed. No pertinent family history. History  Substance Use Topics  . Smoking status: Never Smoker   . Smokeless tobacco: Never Used  . Alcohol Use: No   OB History   Grav Para Term Preterm Abortions TAB SAB Ect Mult Living            0     Review of Systems  Genitourinary: Positive for vaginal bleeding.   All other systems reviewed and are negative except as noted in HPI.     Allergies  Review of patient's allergies indicates no known allergies.  Home Medications   Current Outpatient Rx  Name  Route  Sig  Dispense  Refill  . albuterol (PROVENTIL HFA;VENTOLIN HFA) 108 (90 BASE) MCG/ACT inhaler   Inhalation   Inhale 2 puffs into the lungs every 6 (six) hours as needed for wheezing or shortness of breath.         [name] albuterol (PROVENTIL) (2.5 MG/3ML) 0.083% nebulizer solution  Nebulization   Take 3 mLs (2.5 mg total) by nebulization every 6 (six) hours as needed for wheezing or shortness of breath.   75 mL   12   . buPROPion (WELLBUTRIN XL) 150 MG 24 hr tablet   Oral   Take 1 tablet (150 mg total) by mouth daily.   30 tablet   0   . fluticasone (FLOVENT HFA) 110 MCG/ACT inhaler   Inhalation   Inhale 2 puffs into the lungs 2 (two) times daily.   1 Inhaler   12   . hydrochlorothiazide (HYDRODIURIL) 25 MG tablet   Oral   Take 1 tablet (25 mg total) by mouth daily.   30 tablet   1   . ibuprofen (ADVIL,MOTRIN) 200 MG tablet   Oral   Take 600 mg by mouth every 6 (six) hours as needed for headache.         . lisinopril (PRINIVIL,ZESTRIL) 40 MG tablet   Oral   Take 1 tablet (40 mg total) by mouth daily.   30 tablet   1   . pravastatin (PRAVACHOL) 20 MG tablet   Oral   Take 1 tablet (20 mg total) by mouth daily.   30 tablet   1   . Rivaroxaban (XARELTO) 15 MG TABS tablet   Oral   Take 1 tablet (15 mg total) by mouth 2 (two) times daily with a meal.  For 21 days   42 tablet   0   . Rivaroxaban (XARELTO) 20 MG TABS tablet   Oral   Take 1 tablet (20 mg total) by mouth daily with supper. To be started once 15mg  tablets are complete   30 tablet   1    BP 121/58  Pulse 98  Temp(Src) 98.5 F (36.9 C) (Oral)  Resp 20  Ht 5\' 9"  (1.753 m)  Wt 396 lb (179.624 kg)  BMI 58.45 kg/m2  SpO2 97%  LMP 02/02/2014 Physical Exam  Nursing note and vitals reviewed. Constitutional: She is oriented to person, place, and time. She appears well-developed and well-nourished.  HENT:  Head: Normocephalic and atraumatic.  Eyes: EOM are normal. Pupils are equal, round, and reactive to light.  Pale sclera  Neck: Normal range of motion. Neck supple.  Cardiovascular: Normal rate, normal heart sounds and intact distal pulses.   Pulmonary/Chest: Effort normal and breath sounds normal.  Abdominal: Bowel sounds are normal. She exhibits no distension. There is no  tenderness.  Genitourinary:  Clots in vagina and coming from os, no discharge, no mucosal injuries as the source of bleed  Musculoskeletal: Normal range of motion. She exhibits no edema and no tenderness.  Neurological: She is alert and oriented to person, place, and time. She has normal strength. No cranial nerve deficit or sensory deficit.  Skin: Skin is warm and dry. No rash noted.  Psychiatric: She has a normal mood and affect.    ED Course  Procedures (including critical care time) Labs Review Labs Reviewed  CBC WITH DIFFERENTIAL - Abnormal; Notable for the following:    Hemoglobin 9.9 (*)    HCT 31.6 (*)    MCH 25.6 (*)    Platelets 411 (*)    All other components within normal limits  BASIC METABOLIC PANEL - Abnormal; Notable for the following:    Glucose, Bld 149 (*)    GFR calc non Af Amer 84 (*)    All other components within normal limits  PROTIME-INR - Abnormal; Notable for the following:    Prothrombin Time 19.1 (*)    INR 1.66 (*)    All other components within normal limits  APTT - Abnormal; Notable for the following:    aPTT 42 (*)    All other components within normal limits  TYPE AND SCREEN   Imaging Review: None for this visit   EKG Interpretation None      MDM   Final diagnoses:  Vaginal bleeding  Coagulopathy    No significant change in Hgb. Discussed with Dr. Linard Millers on call for Gyn who recommends Megace BID. Pt given first dose here, will need close Gyn followup or re-eval at [facility] if symptoms persist. Advised to return for any other concerns.      Matthieu Loftus B. Karle Starch, MD 03/04/14 1756

## 2014-03-05 ENCOUNTER — Emergency Department (HOSPITAL_COMMUNITY): Payer: Medicare Other

## 2014-03-05 ENCOUNTER — Encounter (HOSPITAL_COMMUNITY): Payer: Self-pay | Admitting: Emergency Medicine

## 2014-03-05 ENCOUNTER — Inpatient Hospital Stay (HOSPITAL_COMMUNITY)
Admission: EM | Admit: 2014-03-05 | Discharge: 2014-03-08 | DRG: 760 | Disposition: A | Discharge status: Home / Self Care | Payer: Medicare Other | Attending: Internal Medicine | Admitting: Internal Medicine

## 2014-03-05 DIAGNOSIS — N939 Abnormal uterine and vaginal bleeding, unspecified: Secondary | ICD-10-CM

## 2014-03-05 DIAGNOSIS — R079 Chest pain, unspecified: Secondary | ICD-10-CM

## 2014-03-05 DIAGNOSIS — I421 Obstructive hypertrophic cardiomyopathy: Secondary | ICD-10-CM | POA: Diagnosis not present

## 2014-03-05 DIAGNOSIS — Z86711 Personal history of pulmonary embolism: Secondary | ICD-10-CM

## 2014-03-05 DIAGNOSIS — F329 Major depressive disorder, single episode, unspecified: Secondary | ICD-10-CM | POA: Diagnosis present

## 2014-03-05 DIAGNOSIS — I472 Ventricular tachycardia: Secondary | ICD-10-CM

## 2014-03-05 DIAGNOSIS — R011 Cardiac murmur, unspecified: Secondary | ICD-10-CM | POA: Diagnosis present

## 2014-03-05 DIAGNOSIS — Z7901 Long term (current) use of anticoagulants: Secondary | ICD-10-CM

## 2014-03-05 DIAGNOSIS — D68318 Other hemorrhagic disorder due to intrinsic circulating anticoagulants, antibodies, or inhibitors: Secondary | ICD-10-CM | POA: Diagnosis not present

## 2014-03-05 DIAGNOSIS — I2699 Other pulmonary embolism without acute cor pulmonale: Secondary | ICD-10-CM

## 2014-03-05 DIAGNOSIS — J449 Chronic obstructive pulmonary disease, unspecified: Secondary | ICD-10-CM | POA: Diagnosis present

## 2014-03-05 DIAGNOSIS — N898 Other specified noninflammatory disorders of vagina: Secondary | ICD-10-CM | POA: Diagnosis not present

## 2014-03-05 DIAGNOSIS — I10 Essential (primary) hypertension: Secondary | ICD-10-CM | POA: Diagnosis not present

## 2014-03-05 DIAGNOSIS — F3289 Other specified depressive episodes: Secondary | ICD-10-CM | POA: Diagnosis present

## 2014-03-05 DIAGNOSIS — I498 Other specified cardiac arrhythmias: Secondary | ICD-10-CM | POA: Diagnosis present

## 2014-03-05 DIAGNOSIS — Z6841 Body Mass Index (BMI) 40.0 and over, adult: Secondary | ICD-10-CM

## 2014-03-05 DIAGNOSIS — J45909 Unspecified asthma, uncomplicated: Secondary | ICD-10-CM

## 2014-03-05 DIAGNOSIS — J4489 Other specified chronic obstructive pulmonary disease: Secondary | ICD-10-CM | POA: Diagnosis present

## 2014-03-05 DIAGNOSIS — N921 Excessive and frequent menstruation with irregular cycle: Secondary | ICD-10-CM | POA: Diagnosis not present

## 2014-03-05 DIAGNOSIS — F411 Generalized anxiety disorder: Secondary | ICD-10-CM | POA: Diagnosis present

## 2014-03-05 DIAGNOSIS — D62 Acute posthemorrhagic anemia: Secondary | ICD-10-CM | POA: Diagnosis present

## 2014-03-05 DIAGNOSIS — D649 Anemia, unspecified: Secondary | ICD-10-CM

## 2014-03-05 DIAGNOSIS — E119 Type 2 diabetes mellitus without complications: Secondary | ICD-10-CM | POA: Diagnosis present

## 2014-03-05 DIAGNOSIS — R0600 Dyspnea, unspecified: Secondary | ICD-10-CM

## 2014-03-05 DIAGNOSIS — R000 Tachycardia, unspecified: Secondary | ICD-10-CM

## 2014-03-05 DIAGNOSIS — R0602 Shortness of breath: Secondary | ICD-10-CM | POA: Diagnosis not present

## 2014-03-05 LAB — CBC WITH DIFFERENTIAL/PLATELET
BASOS ABS: 0 10*3/uL (ref 0.0–0.1)
Basophils Relative: 0 % (ref 0–1)
EOS PCT: 3 % (ref 0–5)
Eosinophils Absolute: 0.3 10*3/uL (ref 0.0–0.7)
HCT: 22.5 % — ABNORMAL LOW (ref 36.0–46.0)
Hemoglobin: 7.3 g/dL — ABNORMAL LOW (ref 12.0–15.0)
Lymphocytes Relative: 21 % (ref 12–46)
Lymphs Abs: 2.4 10*3/uL (ref 0.7–4.0)
MCH: 26.3 pg (ref 26.0–34.0)
MCHC: 32.4 g/dL (ref 30.0–36.0)
MCV: 80.9 fL (ref 78.0–100.0)
Monocytes Absolute: 0.6 10*3/uL (ref 0.1–1.0)
Monocytes Relative: 5 % (ref 3–12)
Neutro Abs: 7.8 10*3/uL — ABNORMAL HIGH (ref 1.7–7.7)
Neutrophils Relative %: 71 % (ref 43–77)
PLATELETS: 306 10*3/uL (ref 150–400)
RBC: 2.78 MIL/uL — ABNORMAL LOW (ref 3.87–5.11)
RDW: 14.5 % (ref 11.5–15.5)
WBC: 11 10*3/uL — ABNORMAL HIGH (ref 4.0–10.5)

## 2014-03-05 LAB — COMPREHENSIVE METABOLIC PANEL
ALBUMIN: 3.1 g/dL — AB (ref 3.5–5.2)
ALT: 20 U/L (ref 0–35)
AST: 16 U/L (ref 0–37)
Alkaline Phosphatase: 87 U/L (ref 39–117)
BUN: 16 mg/dL (ref 6–23)
CALCIUM: 8.6 mg/dL (ref 8.4–10.5)
CO2: 27 meq/L (ref 19–32)
Chloride: 102 mEq/L (ref 96–112)
Creatinine, Ser: 0.75 mg/dL (ref 0.50–1.10)
GFR calc Af Amer: >90 mL/min (ref >90)
GFR calc non Af Amer: >90 mL/min (ref >90)
Glucose, Bld: 258 mg/dL — ABNORMAL HIGH (ref 70–99)
Potassium: 3.9 mEq/L (ref 3.7–5.3)
SODIUM: 138 meq/L (ref 137–147)
Total Bilirubin: 0.2 mg/dL — ABNORMAL LOW (ref 0.3–1.2)
Total Protein: 6.4 g/dL (ref 6.0–8.3)

## 2014-03-05 NOTE — ED Notes (Signed)
Pt recently dx with PE and placed on xarelto. Since being placed reports problems with increased heavy vaginal bleeding and still having periods of increase sob per pt. Pt seen last pm in ER and was given Megace to help slow bleeding. Pt pale in appearance.

## 2014-03-06 ENCOUNTER — Encounter (HOSPITAL_COMMUNITY): Payer: Self-pay | Admitting: Internal Medicine

## 2014-03-06 ENCOUNTER — Inpatient Hospital Stay (HOSPITAL_COMMUNITY): Payer: Medicare Other

## 2014-03-06 DIAGNOSIS — R079 Chest pain, unspecified: Secondary | ICD-10-CM | POA: Diagnosis not present

## 2014-03-06 DIAGNOSIS — D62 Acute posthemorrhagic anemia: Secondary | ICD-10-CM | POA: Diagnosis not present

## 2014-03-06 DIAGNOSIS — R011 Cardiac murmur, unspecified: Secondary | ICD-10-CM | POA: Diagnosis present

## 2014-03-06 DIAGNOSIS — I4729 Other ventricular tachycardia: Secondary | ICD-10-CM | POA: Diagnosis not present

## 2014-03-06 DIAGNOSIS — R0989 Other specified symptoms and signs involving the circulatory and respiratory systems: Secondary | ICD-10-CM

## 2014-03-06 DIAGNOSIS — I421 Obstructive hypertrophic cardiomyopathy: Secondary | ICD-10-CM | POA: Diagnosis not present

## 2014-03-06 DIAGNOSIS — D649 Anemia, unspecified: Secondary | ICD-10-CM | POA: Diagnosis not present

## 2014-03-06 DIAGNOSIS — R0602 Shortness of breath: Secondary | ICD-10-CM | POA: Diagnosis not present

## 2014-03-06 DIAGNOSIS — N939 Abnormal uterine and vaginal bleeding, unspecified: Secondary | ICD-10-CM | POA: Diagnosis present

## 2014-03-06 DIAGNOSIS — J45909 Unspecified asthma, uncomplicated: Secondary | ICD-10-CM | POA: Diagnosis not present

## 2014-03-06 DIAGNOSIS — E119 Type 2 diabetes mellitus without complications: Secondary | ICD-10-CM | POA: Diagnosis present

## 2014-03-06 DIAGNOSIS — Z86711 Personal history of pulmonary embolism: Secondary | ICD-10-CM | POA: Diagnosis not present

## 2014-03-06 DIAGNOSIS — Z6841 Body Mass Index (BMI) 40.0 and over, adult: Secondary | ICD-10-CM | POA: Diagnosis not present

## 2014-03-06 DIAGNOSIS — N898 Other specified noninflammatory disorders of vagina: Principal | ICD-10-CM

## 2014-03-06 DIAGNOSIS — R0609 Other forms of dyspnea: Secondary | ICD-10-CM

## 2014-03-06 DIAGNOSIS — I498 Other specified cardiac arrhythmias: Secondary | ICD-10-CM | POA: Diagnosis present

## 2014-03-06 DIAGNOSIS — F411 Generalized anxiety disorder: Secondary | ICD-10-CM | POA: Diagnosis present

## 2014-03-06 DIAGNOSIS — F329 Major depressive disorder, single episode, unspecified: Secondary | ICD-10-CM | POA: Diagnosis present

## 2014-03-06 DIAGNOSIS — I10 Essential (primary) hypertension: Secondary | ICD-10-CM

## 2014-03-06 DIAGNOSIS — F3289 Other specified depressive episodes: Secondary | ICD-10-CM | POA: Diagnosis present

## 2014-03-06 DIAGNOSIS — Z7901 Long term (current) use of anticoagulants: Secondary | ICD-10-CM | POA: Diagnosis not present

## 2014-03-06 DIAGNOSIS — J449 Chronic obstructive pulmonary disease, unspecified: Secondary | ICD-10-CM | POA: Diagnosis present

## 2014-03-06 DIAGNOSIS — I472 Ventricular tachycardia: Secondary | ICD-10-CM | POA: Diagnosis not present

## 2014-03-06 DIAGNOSIS — I2699 Other pulmonary embolism without acute cor pulmonale: Secondary | ICD-10-CM

## 2014-03-06 DIAGNOSIS — I517 Cardiomegaly: Secondary | ICD-10-CM

## 2014-03-06 LAB — CBC
HCT: 25.3 % — ABNORMAL LOW (ref 36.0–46.0)
Hemoglobin: 8.3 g/dL — ABNORMAL LOW (ref 12.0–15.0)
MCH: 26.4 pg (ref 26.0–34.0)
MCHC: 32.8 g/dL (ref 30.0–36.0)
MCV: 80.6 fL (ref 78.0–100.0)
PLATELETS: 311 10*3/uL (ref 150–400)
RBC: 3.14 MIL/uL — ABNORMAL LOW (ref 3.87–5.11)
RDW: 15.2 % (ref 11.5–15.5)
WBC: 13 10*3/uL — AB (ref 4.0–10.5)

## 2014-03-06 LAB — TROPONIN I
Troponin I: <0.3 ng/mL (ref <0.30)
Troponin I: <0.3 ng/mL (ref <0.30)

## 2014-03-06 LAB — VITAMIN B12: Vitamin B-12: 959 pg/mL — ABNORMAL HIGH (ref 211–911)

## 2014-03-06 LAB — IRON AND TIBC
Iron: 25 ug/dL — ABNORMAL LOW (ref 42–135)
Saturation Ratios: 8 % — ABNORMAL LOW (ref 20–55)
TIBC: 317 ug/dL (ref 250–470)
UIBC: 292 ug/dL (ref 125–400)

## 2014-03-06 LAB — PREPARE RBC (CROSSMATCH)

## 2014-03-06 LAB — RETICULOCYTES
RBC.: 2.81 MIL/uL — AB (ref 3.87–5.11)
Retic Count, Absolute: 101.2 10*3/uL (ref 19.0–186.0)
Retic Ct Pct: 3.6 % — ABNORMAL HIGH (ref 0.4–3.1)

## 2014-03-06 LAB — FERRITIN: Ferritin: 8 ng/mL — ABNORMAL LOW (ref 10–291)

## 2014-03-06 LAB — HCG, QUANTITATIVE, PREGNANCY

## 2014-03-06 LAB — ABO/RH: ABO/RH(D): O POS

## 2014-03-06 LAB — APTT: aPTT: 49 seconds — ABNORMAL HIGH (ref 24–37)

## 2014-03-06 LAB — MRSA PCR SCREENING: MRSA BY PCR: POSITIVE — AB

## 2014-03-06 LAB — FOLATE: FOLATE: 13.5 ng/mL

## 2014-03-06 MED ORDER — ACETAMINOPHEN 325 MG PO TABS
650.0000 mg | ORAL_TABLET | Freq: Four times a day (QID) | ORAL | Status: DC | PRN
  Administered 2014-03-07: 650 mg via ORAL
  Filled 2014-03-06: qty 2

## 2014-03-06 MED ORDER — SODIUM CHLORIDE 0.9 % IJ SOLN
3.0000 mL | Freq: Two times a day (BID) | INTRAMUSCULAR | Status: DC
  Administered 2014-03-06 – 2014-03-08 (×4): 3 mL via INTRAVENOUS

## 2014-03-06 MED ORDER — GI COCKTAIL ~~LOC~~
30.0000 mL | Freq: Three times a day (TID) | ORAL | Status: DC | PRN
  Administered 2014-03-06: 30 mL via ORAL
  Filled 2014-03-06: qty 30

## 2014-03-06 MED ORDER — SODIUM CHLORIDE 0.9 % IV SOLN: 250.0000 mL | INTRAVENOUS | Status: DC | PRN

## 2014-03-06 MED ORDER — ACETAMINOPHEN 650 MG RE SUPP: 650.0000 mg | Freq: Four times a day (QID) | RECTAL | Status: DC | PRN

## 2014-03-06 MED ORDER — SODIUM CHLORIDE 0.9 % IJ SOLN: 3.0000 mL | INTRAMUSCULAR | Status: DC | PRN

## 2014-03-06 MED ORDER — ONDANSETRON HCL 4 MG PO TABS: 4.0000 mg | ORAL_TABLET | Freq: Four times a day (QID) | ORAL | Status: DC | PRN

## 2014-03-06 MED ORDER — ONDANSETRON HCL 4 MG/2ML IJ SOLN: 4.0000 mg | Freq: Four times a day (QID) | INTRAMUSCULAR | Status: DC | PRN

## 2014-03-06 MED ORDER — FLUTICASONE PROPIONATE HFA 110 MCG/ACT IN AERO
2.0000 | INHALATION_SPRAY | Freq: Two times a day (BID) | RESPIRATORY_TRACT | Status: DC
  Administered 2014-03-06 – 2014-03-07 (×3): 2 via RESPIRATORY_TRACT
  Filled 2014-03-06 (×5): qty 12

## 2014-03-06 MED ORDER — NITROGLYCERIN 0.4 MG SL SUBL: 0.4000 mg | SUBLINGUAL_TABLET | SUBLINGUAL | Status: DC | PRN

## 2014-03-06 MED ORDER — MEGESTROL ACETATE 40 MG PO TABS
80.0000 mg | ORAL_TABLET | Freq: Once | ORAL | Status: AC
  Administered 2014-03-06: 80 mg via ORAL
  Filled 2014-03-06: qty 2

## 2014-03-06 MED ORDER — MEGESTROL ACETATE 40 MG PO TABS
80.0000 mg | ORAL_TABLET | Freq: Three times a day (TID) | ORAL | Status: DC
  Administered 2014-03-06 – 2014-03-07 (×4): 80 mg via ORAL
  Filled 2014-03-06 (×6): qty 2

## 2014-03-06 MED ORDER — CHLORHEXIDINE GLUCONATE CLOTH 2 % EX PADS
6.0000 | MEDICATED_PAD | Freq: Every day | CUTANEOUS | Status: DC
  Administered 2014-03-07 – 2014-03-08 (×2): 6 via TOPICAL

## 2014-03-06 MED ORDER — ESTROGENS CONJUGATED 25 MG IJ SOLR
25.0000 mg | Freq: Four times a day (QID) | INTRAMUSCULAR | Status: DC | PRN
  Administered 2014-03-06: 25 mg via INTRAVENOUS
  Filled 2014-03-06: qty 25

## 2014-03-06 MED ORDER — MEGESTROL ACETATE 40 MG PO TABS
ORAL_TABLET | ORAL | Status: AC
  Filled 2014-03-06: qty 2

## 2014-03-06 MED ORDER — PANTOPRAZOLE SODIUM 40 MG PO TBEC
40.0000 mg | DELAYED_RELEASE_TABLET | Freq: Every day | ORAL | Status: DC
  Administered 2014-03-06 – 2014-03-08 (×3): 40 mg via ORAL
  Filled 2014-03-06 (×3): qty 1

## 2014-03-06 MED ORDER — ALBUTEROL SULFATE (2.5 MG/3ML) 0.083% IN NEBU: 2.5000 mg | INHALATION_SOLUTION | RESPIRATORY_TRACT | Status: DC | PRN

## 2014-03-06 MED ORDER — SODIUM CHLORIDE 0.9 % IJ SOLN
3.0000 mL | Freq: Two times a day (BID) | INTRAMUSCULAR | Status: DC
  Administered 2014-03-07: 3 mL via INTRAVENOUS

## 2014-03-06 MED ORDER — PANTOPRAZOLE SODIUM 40 MG PO TBEC: 40.0000 mg | DELAYED_RELEASE_TABLET | Freq: Every day | ORAL | Status: DC

## 2014-03-06 MED ORDER — MORPHINE SULFATE 2 MG/ML IJ SOLN
1.0000 mg | INTRAMUSCULAR | Status: DC | PRN
  Administered 2014-03-06: 1 mg via INTRAVENOUS
  Filled 2014-03-06: qty 1

## 2014-03-06 MED ORDER — METOPROLOL TARTRATE 25 MG PO TABS
25.0000 mg | ORAL_TABLET | Freq: Four times a day (QID) | ORAL | Status: DC
  Administered 2014-03-06 – 2014-03-07 (×5): 25 mg via ORAL
  Filled 2014-03-06 (×11): qty 1

## 2014-03-06 MED ORDER — TECHNETIUM TC 99M DIETHYLENETRIAME-PENTAACETIC ACID
40.0000 | Freq: Once | INTRAVENOUS | Status: AC | PRN
  Administered 2014-03-06: 40 via RESPIRATORY_TRACT

## 2014-03-06 MED ORDER — MUPIROCIN 2 % EX OINT
1.0000 "application " | TOPICAL_OINTMENT | Freq: Two times a day (BID) | CUTANEOUS | Status: DC
  Administered 2014-03-06 – 2014-03-08 (×5): 1 via NASAL
  Filled 2014-03-06 (×2): qty 22

## 2014-03-06 MED ORDER — WHITE PETROLATUM GEL
Status: AC
  Administered 2014-03-06: 0.2
  Filled 2014-03-06: qty 5

## 2014-03-06 MED ORDER — TECHNETIUM TO 99M ALBUMIN AGGREGATED
6.0000 | Freq: Once | INTRAVENOUS | Status: AC | PRN
  Administered 2014-03-06: 6 via INTRAVENOUS

## 2014-03-06 MED ORDER — BUPROPION HCL ER (XL) 150 MG PO TB24
150.0000 mg | ORAL_TABLET | Freq: Every day | ORAL | Status: DC
  Administered 2014-03-06 – 2014-03-08 (×3): 150 mg via ORAL
  Filled 2014-03-06 (×3): qty 1

## 2014-03-06 NOTE — H&P (Signed)
[facility] History and Physical  Annastacia Duba TDD:[id number] DOB: 04/01/1971 DOA: 03/05/2014   PCP: Patient is supposed to see Dr. Anastasio Champion, but hasn't seen him yet. She moved here recently from [facility] Specialists: None  Chief Complaint: Vaginal bleeding  HPI: Erin Sexton is a 43 y.o. female who is morbidly obese, and who was recently hospitalized for her shortness of breath, and chest pain. She had a CT scan done for elevated d-dimer. She had the lower extremity Doppler done, which was negative for DVT. However, the physicians who took care of her during previous hospitalization felt that she could have had PE not detected by CT scan and placed her on anticoagulation. About 2 days ago, she started having vaginal bleeding on and off. She came to the emergency department yesterday and was prescribed Megace after discussions with gynecology. However, her bleeding worsened and so, she decided to come back today. She's had worsening shortness of breath, and chest pain, as well. Has been dizzy, but denies any syncopal episode, although she did have near syncope. No fever. Has been cold. Denies any weight loss. No abdominal pain or cramping. Last menstrual period was February 23.  Home Medications: Prior to Admission medications   Medication Sig Start Date End Date Taking? Authorizing Provider  albuterol (PROVENTIL HFA;VENTOLIN HFA) 108 (90 BASE) MCG/ACT inhaler Inhale 2 puffs into the lungs every 6 (six) hours as needed for wheezing or shortness of breath.    Historical Provider, MD  albuterol (PROVENTIL) (2.5 MG/3ML) 0.083% nebulizer solution Take 3 mLs (2.5 mg total) by nebulization every 6 (six) hours as needed for wheezing or shortness of breath. 03/03/14   Kathie Dike, MD  buPROPion (WELLBUTRIN XL) 150 MG 24 hr tablet Take 1 tablet (150 mg total) by mouth daily. 03/03/14   Kathie Dike, MD  fluticasone (FLOVENT HFA) 110 MCG/ACT inhaler Inhale 2 puffs into the lungs 2 (two)  times daily. 03/03/14   Kathie Dike, MD  hydrochlorothiazide (HYDRODIURIL) 25 MG tablet Take 1 tablet (25 mg total) by mouth daily. 03/03/14   Kathie Dike, MD  ibuprofen (ADVIL,MOTRIN) 200 MG tablet Take 600 mg by mouth every 6 (six) hours as needed for headache.    Historical Provider, MD  lisinopril (PRINIVIL,ZESTRIL) 40 MG tablet Take 1 tablet (40 mg total) by mouth daily. 03/03/14   Kathie Dike, MD  megestrol (MEGACE) 40 MG tablet Take 1 tablet (40 mg total) by mouth 2 (two) times daily. 03/04/14   Charles B. Karle Starch, MD  pravastatin (PRAVACHOL) 20 MG tablet Take 1 tablet (20 mg total) by mouth daily. 03/03/14   Kathie Dike, MD  Rivaroxaban (XARELTO) 15 MG TABS tablet Take 1 tablet (15 mg total) by mouth 2 (two) times daily with a meal. For 21 days 03/03/14   Kathie Dike, MD  Rivaroxaban (XARELTO) 20 MG TABS tablet Take 1 tablet (20 mg total) by mouth daily with supper. To be started once $RemoveBefo'15mg'qsOLSWdRwPl$  tablets are complete 03/03/14   Kathie Dike, MD    Allergies: No Known Allergies  Past Medical History: Past Medical History  Diagnosis Date  . COPD (chronic obstructive pulmonary disease)   . Depression   . Hypertension   . Asthma   . Anxiety   . Anemia   . Pulmonary emboli   . Blood transfusion without reported diagnosis     History reviewed. No pertinent past surgical history.  Social History: She lives in [place]. She is accompanied by her mother and sister. No smoking or alcohol use. No  illicit drug use and dependence daily activities.  Family History: No health problems in the family. No history of blood clots in the family.  Review of Systems - History obtained from the patient General ROS: positive for  - fatigue Psychological ROS: negative Ophthalmic ROS: negative ENT ROS: negative Allergy and Immunology ROS: negative Hematological and Lymphatic ROS: negative Endocrine ROS: negative Respiratory ROS: as in hpi Cardiovascular ROS: as in hpi Gastrointestinal ROS:  no abdominal pain, change in bowel habits, or black or bloody stools Genito-Urinary ROS: as in hpi Musculoskeletal ROS: negative Neurological ROS: no TIA or stroke symptoms Dermatological ROS: negative  Physical Examination  Filed Vitals:   03/05/14 2210 03/06/14 0100 03/06/14 0130 03/06/14 0200  BP: 130/59 118/61 107/55 115/54  Pulse: 98 91    Temp: 98.5 F (36.9 C) 97.9 F (36.6 C) 97.8 F (36.6 C) 97.8 F (36.6 C)  TempSrc: Oral Oral Oral   Resp: $Remo'22 16 16 18  'UNsRo$ Height: $Rem'5\' 9"'fOFL$  (1.753 m)     Weight: 179.171 kg (395 lb)     SpO2: 96% 99%      BP 115/54  Pulse 91  Temp(Src) 97.8 F (36.6 C) (Oral)  Resp 18  Ht $R'5\' 9"'oN$  (1.753 m)  Wt 179.171 kg (395 lb)  BMI 58.30 kg/m2  SpO2 99%  LMP 03/02/2014  General appearance: alert, cooperative, appears stated age, no distress and morbidly obese Head: Normocephalic, without obvious abnormality, atraumatic Eyes: conjunctivae/corneas clear. PERRL, EOM's intact. Throat: lips, mucosa, and tongue normal; teeth and gums normal Neck: no adenopathy, no carotid bruit, no JVD, supple, symmetrical, trachea midline and thyroid not enlarged, symmetric, no tenderness/mass/nodules Resp: clear to auscultation bilaterally Cardio: S1-S2 is normal. Regular. Loud systolic murmur appreciated over the precordium. No S3, S4. No rubs, or bruit. GI: soft, non-tender; bowel sounds normal; no masses,  no organomegaly Extremities: Minimal Pedal edema bilaterally Pulses: 2+ and symmetric Skin: Skin color, texture, turgor normal. No rashes or lesions Lymph nodes: Cervical, supraclavicular, and axillary nodes normal. Neurologic: Alert and oriented x3. No focal neurological deficits are present.  Laboratory Data: Results for orders placed during the hospital encounter of 03/05/14 (from the past 48 hour(s))  CBC WITH DIFFERENTIAL     Status: Abnormal   Collection Time    03/05/14 10:59 PM      Result Value Ref Range   WBC 11.0 (*) 4.0 - 10.5 K/uL   RBC 2.78 (*)  3.87 - 5.11 MIL/uL   Hemoglobin 7.3 (*) 12.0 - 15.0 g/dL   Comment: DELTA CHECK NOTED   HCT 22.5 (*) 36.0 - 46.0 %   MCV 80.9  78.0 - 100.0 fL   MCH 26.3  26.0 - 34.0 pg   MCHC 32.4  30.0 - 36.0 g/dL   RDW 14.5  11.5 - 15.5 %   Platelets 306  150 - 400 K/uL   Comment: DELTA CHECK NOTED   Neutrophils Relative % 71  43 - 77 %   Neutro Abs 7.8 (*) 1.7 - 7.7 K/uL   Lymphocytes Relative 21  12 - 46 %   Lymphs Abs 2.4  0.7 - 4.0 K/uL   Monocytes Relative 5  3 - 12 %   Monocytes Absolute 0.6  0.1 - 1.0 K/uL   Eosinophils Relative 3  0 - 5 %   Eosinophils Absolute 0.3  0.0 - 0.7 K/uL   Basophils Relative 0  0 - 1 %   Basophils Absolute 0.0  0.0 - 0.1 K/uL  COMPREHENSIVE METABOLIC  PANEL     Status: Abnormal   Collection Time    03/05/14 10:59 PM      Result Value Ref Range   Sodium 138  137 - 147 mEq/L   Potassium 3.9  3.7 - 5.3 mEq/L   Chloride 102  96 - 112 mEq/L   CO2 27  19 - 32 mEq/L   Glucose, Bld 258 (*) 70 - 99 mg/dL   BUN 16  6 - 23 mg/dL   Creatinine, Ser 0.75  0.50 - 1.10 mg/dL   Calcium 8.6  8.4 - 10.5 mg/dL   Total Protein 6.4  6.0 - 8.3 g/dL   Albumin 3.1 (*) 3.5 - 5.2 g/dL   AST 16  0 - 37 U/L   ALT 20  0 - 35 U/L   Alkaline Phosphatase 87  39 - 117 U/L   Total Bilirubin 0.2 (*) 0.3 - 1.2 mg/dL   GFR calc non Af Amer >90  >90 mL/min   GFR calc Af Amer >90  >90 mL/min   Comment: (NOTE)     The eGFR has been calculated using the CKD EPI equation.     This calculation has not been validated in all clinical situations.     eGFR's persistently <90 mL/min signify possible Chronic Kidney     Disease.  TYPE AND SCREEN     Status: None   Collection Time    03/05/14 11:40 PM      Result Value Ref Range   ABO/RH(D) O POS     Antibody Screen NEG     Sample Expiration 03/08/2014     Unit Number [id number]     Blood Component Type RED CELLS,LR     Unit division 00     Status of Unit ALLOCATED     Transfusion Status OK TO TRANSFUSE     Crossmatch Result Compatible      Unit Number [id number]     Blood Component Type RED CELLS,LR     Unit division 00     Status of Unit ISSUED     Transfusion Status OK TO TRANSFUSE     Crossmatch Result Compatible    PREPARE RBC (CROSSMATCH)     Status: None   Collection Time    03/05/14 11:40 PM      Result Value Ref Range   Order Confirmation ORDER PROCESSED BY BLOOD BANK    RETICULOCYTES     Status: Abnormal   Collection Time    03/06/14 12:23 AM      Result Value Ref Range   Retic Ct Pct 3.6 (*) 0.4 - 3.1 %   RBC. 2.81 (*) 3.87 - 5.11 MIL/uL   Retic Count, Manual 101.2  19.0 - 186.0 K/uL  APTT     Status: Abnormal   Collection Time    03/06/14 12:23 AM      Result Value Ref Range   aPTT 49 (*) 24 - 37 seconds   Comment:            IF BASELINE aPTT IS ELEVATED,     SUGGEST PATIENT RISK ASSESSMENT     BE USED TO DETERMINE APPROPRIATE     ANTICOAGULANT THERAPY.  TROPONIN I     Status: None   Collection Time    03/06/14 12:23 AM      Result Value Ref Range   Troponin I <0.30  <0.30 ng/mL   Comment:            Due to  the release kinetics of cTnI,     a negative result within the first hours     of the onset of symptoms does not rule out     myocardial infarction with certainty.     If myocardial infarction is still suspected,     repeat the test at appropriate intervals.  HCG, QUANTITATIVE, PREGNANCY     Status: None   Collection Time    03/06/14 12:23 AM      Result Value Ref Range   hCG, Beta Chain, Quant, S <1  <5 mIU/mL   Comment:              GEST. AGE      CONC.  (mIU/mL)       <=1 WEEK        5 - 50         2 WEEKS       50 - 500         3 WEEKS       100 - 10,000         4 WEEKS     1,000 - 30,000         5 WEEKS     3,500 - 115,000       6-8 WEEKS     12,000 - 270,000        12 WEEKS     15,000 - 220,000                FEMALE AND NON-PREGNANT FEMALE:         LESS THAN 5 mIU/mL    Radiology Reports: Dg Chest Portable 1 View  03/05/2014   CLINICAL DATA:  Shortness of breath,  chest pain, vaginal bleeding.  EXAM: PORTABLE CHEST - 1 VIEW  COMPARISON:  CT ANGIO CHEST W/CM &/OR WO/CM dated 03/02/2014; DG CHEST 2 VIEW dated 03/02/2014  FINDINGS: The cardiac silhouette is upper limits of nor size, mediastinal silhouette is nonsuspicious for this low inspiratory portable examination with crowded vasculature markings.The lungs are clear without pleural effusions or focal consolidations. Trachea projects midline and there is no pneumothorax. Included soft tissue planes and osseous structures are non-suspicious. Moderate degenerative change of thoracic spine.  IMPRESSION: Borderline cardiomegaly, no acute pulmonary process.   Electronically Signed   By: Elon Alas   On: 03/05/2014 22:52    Electrocardiogram: Sinus Rhythm at 96 beats per minute. Normal axis. LVH is noted. T. inversion is noted in leads 1 and aVL. No concerning ST changes. Similar to EKG from a few days ago.  Problem List  Principal Problem:   Vaginal bleeding Active Problems:   Morbid obesity   Dyspnea   Assessment: This is a 43 year old, morbidly obese, white female, who presents with vaginal bleeding on anticoagulation. Etiology for vaginal bleeding remains unclear. It is also not certain if her previous diagnoses of PE is firmly established. D-dimer was minimally elevated, but CT scan did not show any obvious clots. Lower extremity Doppler did not show any DVT. She does have a loud systolic murmur and has some pedal edema and has abnormal EKG. She could have other reasons for dyspnea and chest discomfort. She did rule out for acute coronary syndrome during this past hospitalization.  Plan: #1 vaginal bleeding: Case has been discussed with the gynecology and the only course of action is to give her more Megace. This will be ordered. Pelvic ultrasound will be obtained in the morning. GYN will be available for consultation as  needed. Discussed with Dr. Penny Pia. Beta hCG was less than 1  #2 recent presumed  diagnosis of PE: This diagnosis remains in question. I have discussed with pulmonology, Dr. Neena Rhymes, and he recommends a VQ scan in the morning. Echocardiogram will also be ordered. She last took her Xarelto at 8 PM last night. Further anticoagulation will be decided based on pulmonology input. Pulmonology will consult when patient arrives at [facility].  #3 dyspnea and chest pain: It is quite possible that her symptoms got worse due to the anemia. However, etiology for her dyspnea and chest pain remains unclear for the most part. I'm not convinced that she has pulmonary embolism. Echocardiogram should give more information.  #4 acute blood loss anemia: She'll be transfused 2 units of blood. CBC will be checked post transfusion.  #5 history of hypertension: Blood pressure is soft. Hold her antihypertensive agents for now.  #6 morbid obesity: She'll require weight loss counseling.  #7 hyperglycemia: This is a nonfasting level. Fasting level will be obtained tomorrow morning.  DVT Prophylaxis: SCDs for now. Please see discussion above Code Status: Full code Family Communication: Discussed with the patient and her mother  Disposition Plan: [facility] to [facility]   Further management decisions will depend on results of further testing and patient's response to treatment.   [facility]  [facility] Pager [phone]  If 7PM-7AM, please contact night-coverage [email or web address] Password [email or web address]  03/06/2014, 2:20 AM

## 2014-03-06 NOTE — ED Notes (Signed)
Second unit of blood picked up and was sent with [facility] to begin infusion in route to [facility].

## 2014-03-06 NOTE — ED Notes (Signed)
Patient had bleed through her underclothing and soaked the bed. Patient assisted in cleaning herself, passing large blood clots. Patient states that chest was hurting again and having some shortness of breath. RN Madlyn Frankel came in room, instructed to breath slowly and relax.

## 2014-03-06 NOTE — Progress Notes (Signed)
[facility] progress note. Chief complaint. Transfer note. History of present illness. This 43 year old female presented team [facility] hospital earlier with vagina bleeding. The patient has a history of possible PE and had been on anticoagulation. Gynecology was contacted and recommended Megace. Patient for pelvic ultrasound and gynecology consult as needed. Patient was felt to require transfer to [facility] and has now arrived. Am seeing the patient at bedside to ensure she remains stable post transport and that her orders transferred appropriately. She is alert and pleasant. She has no complaints. Vital signs. Temperature 97.9, pulse 93, respiration 18, blood pressure 111/50. O2 sats 100%. General appearance. Obese middle-aged female was alert and in no distress. Cardiac. Rate and rhythm regular with 3/6 systolic ejection murmur. No jugular venous distention. She does have some pedal edema. Lungs. Breath sounds are diminished but clear. Abdomen. Soft with positive bowel sounds. No pain with palpation. Impression/plan. Problem #1. Vagina bleeding. Will be treated with Megace. For pelvic ultrasound later this morning. Gynecology consult as needed. Problem #2. Presumed pulmonary emboli. Patient for VQ scan and echocardiogram. Anticoagulant Xarelto has been stopped. Pulmonology consult pending. Problem #3. Anemia. Patient to be transfused 2 units of packed red blood cells. CBC post transfusion. Patient appears clinically stable post transfer. She is in process of transfusing packed red blood cells. All orders appear to have transferred appropriately.

## 2014-03-06 NOTE — Progress Notes (Signed)
Dr. Sloan Leiter notified that pt is having shortness of breath and chest tightness -- vital signs normal. New orders received for labs, EKG, Morphine and GI cocktail. Erin Sexton, Kirby Crigler

## 2014-03-06 NOTE — Progress Notes (Signed)
Patient transported via [place] on [facility] from [employer]. Patient oriented to unit and room, instructed on callbell and placed at side. Updated on POC. No family at bedside. Second unit PRBCs currently infusing. Completed peri care on patient, pt continues to bleed with clots. Will continue to monitor.

## 2014-03-06 NOTE — Consult Note (Signed)
Reason for Consult:heavy vaginal bleeding and symptomatic anemia Referring Physician: Dr. Sloan Leiter Patient is admitted with CP, heavy vaginal bleeding that started when she was given Xarelto 03/02/14, much heavier than usual. Menses are regular and Erin Sexton is an 43 y.o. female.   Pertinent Gynecological History: Menses: flow is moderate Bleeding: menstrual Contraception: none DES exposure: denies Blood transfusions: 2 units this hospitalization Sexually transmitted diseases: no past history Previous GYN Procedures: none  H/O infertility unexplained, was evaluated Last pap: normal Date: 2014 OB History: G0, P0   Menstrual History: Menarche age:  Patient's last menstrual period was 03/02/2014.    Past Medical History  Diagnosis Date  . COPD (chronic obstructive pulmonary disease)   . Depression   . Hypertension   . Asthma   . Anxiety   . Anemia   . Pulmonary emboli   . Blood transfusion without reported diagnosis     History reviewed. No pertinent past surgical history.  History reviewed. No pertinent family history.  Social History:  reports that she has never smoked. She has never used smokeless tobacco. She reports that she does not drink alcohol or use illicit drugs.  Allergies: No Known Allergies  Medications: I have reviewed the patient's current medications.  ROS  Blood pressure 149/52, pulse 101, temperature 98.5 F (36.9 C), temperature source Oral, resp. rate 36, height $RemoveBe'5\' 9"'[id number]$  (1.753 m), weight 392 lb 10.2 oz (178.1 kg), last menstrual period 03/02/2014, SpO2 99.00%. Physical Exam NAD, not pale. Pelvic exam deferred, [place] result was reviewed Results for orders placed during the hospital encounter of 03/05/14 (from the past 48 hour(s))  CBC WITH DIFFERENTIAL     Status: Abnormal   Collection Time    03/05/14 10:59 PM      Result Value Ref Range   WBC 11.0 (*) 4.0 - 10.5 K/uL   RBC 2.78 (*) 3.87 - 5.11 MIL/uL   Hemoglobin 7.3 (*) 12.0 - 15.0 g/dL    Comment: DELTA CHECK NOTED   HCT 22.5 (*) 36.0 - 46.0 %   MCV 80.9  78.0 - 100.0 fL   MCH 26.3  26.0 - 34.0 pg   MCHC 32.4  30.0 - 36.0 g/dL   RDW 14.5  11.5 - 15.5 %   Platelets 306  150 - 400 K/uL   Comment: DELTA CHECK NOTED   Neutrophils Relative % 71  43 - 77 %   Neutro Abs 7.8 (*) 1.7 - 7.7 K/uL   Lymphocytes Relative 21  12 - 46 %   Lymphs Abs 2.4  0.7 - 4.0 K/uL   Monocytes Relative 5  3 - 12 %   Monocytes Absolute 0.6  0.1 - 1.0 K/uL   Eosinophils Relative 3  0 - 5 %   Eosinophils Absolute 0.3  0.0 - 0.7 K/uL   Basophils Relative 0  0 - 1 %   Basophils Absolute 0.0  0.0 - 0.1 K/uL  COMPREHENSIVE METABOLIC PANEL     Status: Abnormal   Collection Time    03/05/14 10:59 PM      Result Value Ref Range   Sodium 138  137 - 147 mEq/L   Potassium 3.9  3.7 - 5.3 mEq/L   Chloride 102  96 - 112 mEq/L   CO2 27  19 - 32 mEq/L   Glucose, Bld 258 (*) 70 - 99 mg/dL   BUN 16  6 - 23 mg/dL   Creatinine, Ser 0.75  0.50 - 1.10 mg/dL   Calcium 8.6  8.4 -  10.5 mg/dL   Total Protein 6.4  6.0 - 8.3 g/dL   Albumin 3.1 (*) 3.5 - 5.2 g/dL   AST 16  0 - 37 U/L   ALT 20  0 - 35 U/L   Alkaline Phosphatase 87  39 - 117 U/L   Total Bilirubin 0.2 (*) 0.3 - 1.2 mg/dL   GFR calc non Af Amer >90  >90 mL/min   GFR calc Af Amer >90  >90 mL/min   Comment: (NOTE)     The eGFR has been calculated using the CKD EPI equation.     This calculation has not been validated in all clinical situations.     eGFR's persistently <90 mL/min signify possible Chronic Kidney     Disease.  TYPE AND SCREEN     Status: None   Collection Time    03/05/14 11:40 PM      Result Value Ref Range   ABO/RH(D) O POS     Antibody Screen NEG     Sample Expiration 03/08/2014     Unit Number [id number]     Blood Component Type RED CELLS,LR     Unit division 00     Status of Unit ISSUED     Transfusion Status OK TO TRANSFUSE     Crossmatch Result Compatible     Unit Number [id number]     Blood Component Type RED  CELLS,LR     Unit division 00     Status of Unit ISSUED     Transfusion Status OK TO TRANSFUSE     Crossmatch Result Compatible    PREPARE RBC (CROSSMATCH)     Status: None   Collection Time    03/05/14 11:40 PM      Result Value Ref Range   Order Confirmation ORDER PROCESSED BY BLOOD BANK    IRON AND TIBC     Status: Abnormal   Collection Time    03/06/14 12:23 AM      Result Value Ref Range   Iron 25 (*) 42 - 135 ug/dL   TIBC 317  250 - 470 ug/dL   Saturation Ratios 8 (*) 20 - 55 %   UIBC 292  125 - 400 ug/dL   Comment: Performed at [facility]  RETICULOCYTES     Status: Abnormal   Collection Time    03/06/14 12:23 AM      Result Value Ref Range   Retic Ct Pct 3.6 (*) 0.4 - 3.1 %   RBC. 2.81 (*) 3.87 - 5.11 MIL/uL   Retic Count, Manual 101.2  19.0 - 186.0 K/uL  APTT     Status: Abnormal   Collection Time    03/06/14 12:23 AM      Result Value Ref Range   aPTT 49 (*) 24 - 37 seconds   Comment:            IF BASELINE aPTT IS ELEVATED,     SUGGEST PATIENT RISK ASSESSMENT     BE USED TO DETERMINE APPROPRIATE     ANTICOAGULANT THERAPY.  TROPONIN I     Status: None   Collection Time    03/06/14 12:23 AM      Result Value Ref Range   Troponin I <0.30  <0.30 ng/mL   Comment:            Due to the release kinetics of cTnI,     a negative result within the first hours     of the onset of  symptoms does not rule out     myocardial infarction with certainty.     If myocardial infarction is still suspected,     repeat the test at appropriate intervals.  HCG, QUANTITATIVE, PREGNANCY     Status: None   Collection Time    03/06/14 12:23 AM      Result Value Ref Range   hCG, Beta Chain, Quant, S <1  <5 mIU/mL   Comment:              GEST. AGE      CONC.  (mIU/mL)       <=1 WEEK        5 - 50         2 WEEKS       50 - 500         3 WEEKS       100 - 10,000         4 WEEKS     1,000 - 30,000         5 WEEKS     3,500 - 115,000       6-8 WEEKS     12,000 - 270,000         12 WEEKS     15,000 - 220,000                FEMALE AND NON-PREGNANT FEMALE:         LESS THAN 5 mIU/mL  MRSA PCR SCREENING     Status: Abnormal   Collection Time    03/06/14  5:40 AM      Result Value Ref Range   MRSA by PCR POSITIVE (*) NEGATIVE   Comment:            The GeneXpert MRSA Assay (FDA     approved for NASAL specimens     only), is one component of a     comprehensive MRSA colonization     surveillance program. It is not     intended to diagnose MRSA     infection nor to guide or     monitor treatment for     MRSA infections.     RESULT CALLED TO, READ BACK BY AND VERIFIED WITH:     IRBY,T RN [phone] 03/06/14 MITCHELL,L  CBC     Status: Abnormal   Collection Time    03/06/14  9:25 AM      Result Value Ref Range   WBC 13.0 (*) 4.0 - 10.5 K/uL   RBC 3.14 (*) 3.87 - 5.11 MIL/uL   Hemoglobin 8.3 (*) 12.0 - 15.0 g/dL   HCT 25.3 (*) 36.0 - 46.0 %   MCV 80.6  78.0 - 100.0 fL   MCH 26.4  26.0 - 34.0 pg   MCHC 32.8  30.0 - 36.0 g/dL   RDW 15.2  11.5 - 15.5 %   Platelets 311  150 - 400 K/uL  TROPONIN I     Status: None   Collection Time    03/06/14  4:33 PM      Result Value Ref Range   Troponin I <0.30  <0.30 ng/mL   Comment:            Due to the release kinetics of cTnI,     a negative result within the first hours     of the onset of symptoms does not rule out     myocardial infarction with certainty.     If myocardial  infarction is still suspected,     repeat the test at appropriate intervals.  PREPARE RBC (CROSSMATCH)     Status: None   Collection Time    03/06/14  4:33 PM      Result Value Ref Range   Order Confirmation ORDER PROCESSED BY BLOOD BANK    TYPE AND SCREEN     Status: None   Collection Time    03/06/14  4:33 PM      Result Value Ref Range   ABO/RH(D) O POS     Antibody Screen NEG     Sample Expiration 03/09/2014      US Transvaginal Non-ob  03/06/2014   CLINICAL DATA:  Abnormal vaginal bleeding.  EXAM: TRANSABDOMINAL AND  TRANSVAGINAL ULTRASOUND OF PELVIS  TECHNIQUE: Both transabdominal and transvaginal ultrasound examinations of the pelvis were performed. Transabdominal technique was performed for global imaging of the pelvis including uterus, ovaries, adnexal regions, and pelvic cul-de-sac. It was necessary to proceed with endovaginal exam following the transabdominal exam to visualize the uterus and ovaries.  COMPARISON:  None  FINDINGS: Uterus  Measurements: 10.0 x 5.9 x 5.7 cm. Mildly heterogeneous myometrium without evidence of focal mass.  Endometrium  Thickness: 15 mm. There is a small amount of fluid/ blood in the endometrial cavity. The endometrium is homogeneously thickened which may be consistent with secretory endometrium. No obvious polypoid lesions are identified.  Right ovary  Measurements: 2.7 x 1.4 x 2.3 cm. Normal appearance/no adnexal mass.  Other findings  No free fluid.  IMPRESSION: Small amount of fluid/ blood in the endometrial canal with homogeneously thickened endometrium. No focal masses are identified. The left ovary is not visualized by ultrasound.   Electronically Signed   By: Aletta Edouard M.D.   On: 03/06/2014 11:02   US Pelvis Complete  03/06/2014   CLINICAL DATA:  Abnormal vaginal bleeding.  EXAM: TRANSABDOMINAL AND TRANSVAGINAL ULTRASOUND OF PELVIS  TECHNIQUE: Both transabdominal and transvaginal ultrasound examinations of the pelvis were performed. Transabdominal technique was performed for global imaging of the pelvis including uterus, ovaries, adnexal regions, and pelvic cul-de-sac. It was necessary to proceed with endovaginal exam following the transabdominal exam to visualize the uterus and ovaries.  COMPARISON:  None  FINDINGS: Uterus  Measurements: 10.0 x 5.9 x 5.7 cm. Mildly heterogeneous myometrium without evidence of focal mass.  Endometrium  Thickness: 15 mm. There is a small amount of fluid/ blood in the endometrial cavity. The endometrium is homogeneously thickened which may be  consistent with secretory endometrium. No obvious polypoid lesions are identified.  Right ovary  Measurements: 2.7 x 1.4 x 2.3 cm. Normal appearance/no adnexal mass.  Other findings  No free fluid.  IMPRESSION: Small amount of fluid/ blood in the endometrial canal with homogeneously thickened endometrium. No focal masses are identified. The left ovary is not visualized by ultrasound.   Electronically Signed   By: Aletta Edouard M.D.   On: 03/06/2014 11:02   Nm Pulmonary Perf And Vent  03/06/2014   CLINICAL DATA:  Dyspnea  EXAM: NUCLEAR MEDICINE VENTILATION - PERFUSION LUNG SCAN  TECHNIQUE: Ventilation images were obtained in multiple projections using inhaled aerosol technetium 99 M DTPA. Perfusion images were obtained in multiple projections after intravenous injection of Tc-1m MAA. Dual head camera technique could not be performed secondary to patient's weight exceeding the table limits. Three images were obtained in ventilation and 3 for perfusion.  RADIOPHARMACEUTICALS:  40 mCi Tc-66m DTPA aerosol and Chest x-ray an CTA chest 03/02/2014; chest x-ray 03/05/2014  6 mCi Tc-48m MAA  COMPARISON:  None.  FINDINGS: Ventilation: No focal ventilation defect.  Perfusion: No wedge shaped peripheral perfusion defects to suggest acute pulmonary embolism.  IMPRESSION: Negative.   Electronically Signed   By: Jacqulynn Cadet M.D.   On: 03/06/2014 18:43   Dg Chest Portable 1 View  03/05/2014   CLINICAL DATA:  Shortness of breath, chest pain, vaginal bleeding.  EXAM: PORTABLE CHEST - 1 VIEW  COMPARISON:  CT ANGIO CHEST W/CM &/OR WO/CM dated 03/02/2014; DG CHEST 2 VIEW dated 03/02/2014  FINDINGS: The cardiac silhouette is upper limits of nor size, mediastinal silhouette is nonsuspicious for this low inspiratory portable examination with crowded vasculature markings.The lungs are clear without pleural effusions or focal consolidations. Trachea projects midline and there is no pneumothorax. Included soft tissue planes and  osseous structures are non-suspicious. Moderate degenerative change of thoracic spine.  IMPRESSION: Borderline cardiomegaly, no acute pulmonary process.   Electronically Signed   By: Elon Alas   On: 03/05/2014 22:52    Assessment/Plan: Heavy vaginal bleeding and anemia likely triggered by treatment with Xarelto. D/C of medication should help but recommend continuing Megace. I added IV Premarin 25 mg which could be repeated once. She is a resident of [facility] and I recommended follow up with [facility]  Alexei Doswell 03/06/2014

## 2014-03-06 NOTE — Consult Note (Signed)
Dictation #:  [id number]

## 2014-03-06 NOTE — ED Provider Notes (Signed)
CSN: [id number]     Arrival date & time 03/05/14  2212 History  This chart was scribed for Erin Morn, MD by Erin Sexton, ED Scribe. This patient was seen in room APA01/APA01 and the patient's care was started at 12:13 AM.    Chief Complaint  Patient presents with  . Shortness of Breath  . Vaginal Bleeding   The history is provided by the patient. No language interpreter was used.   HPI Comments: Erin Sexton is a 43 y.o. Female with a recent diagnosis of pulmonary emboli (5 days ago) and patient was started on 15 mg Xarelto, anemia, asthma, and COPD who presents to the Emergency Department complaining of shortness of breath with associated chest tightness and chills onset 4-5 days ago that she states has been progressively worsening. Patient reports associated vaginal bleeding since starting the Xarelto that she states is much heavier than her usual menstrual periods. She reports some associated spotting prior to starting the Xarelto, which was a few days earlier than her menstrual period was supposed to start. Patient was seen here yesterday for similar complaints and was discharged with Megace, which she states has not been able to provide significant relief of her symptoms. She denies abdominal pain, fever, cough, congestion. Patient also has a history of HTN.   Past Medical History  Diagnosis Date  . COPD (chronic obstructive pulmonary disease)   . Depression   . Hypertension   . Asthma   . Anxiety   . Anemia   . Pulmonary emboli   . Blood transfusion without reported diagnosis    History reviewed. No pertinent past surgical history. No family history on file. History  Substance Use Topics  . Smoking status: Never Smoker   . Smokeless tobacco: Never Used  . Alcohol Use: No   OB History   Grav Para Term Preterm Abortions TAB SAB Ect Mult Living            0     Review of Systems  A complete 10 system review of systems was obtained and all systems are negative except  as noted in the HPI and PMH.    Allergies  Review of patient's allergies indicates no known allergies.  Home Medications   Current Outpatient Rx  Name  Route  Sig  Dispense  Refill  . albuterol (PROVENTIL HFA;VENTOLIN HFA) 108 (90 BASE) MCG/ACT inhaler   Inhalation   Inhale 2 puffs into the lungs every 6 (six) hours as needed for wheezing or shortness of breath.         [name] albuterol (PROVENTIL) (2.5 MG/3ML) 0.083% nebulizer solution   Nebulization   Take 3 mLs (2.5 mg total) by nebulization every 6 (six) hours as needed for wheezing or shortness of breath.   75 mL   12   . buPROPion (WELLBUTRIN XL) 150 MG 24 hr tablet   Oral   Take 1 tablet (150 mg total) by mouth daily.   30 tablet   0   . fluticasone (FLOVENT HFA) 110 MCG/ACT inhaler   Inhalation   Inhale 2 puffs into the lungs 2 (two) times daily.   1 Inhaler   12   . hydrochlorothiazide (HYDRODIURIL) 25 MG tablet   Oral   Take 1 tablet (25 mg total) by mouth daily.   30 tablet   1   . ibuprofen (ADVIL,MOTRIN) 200 MG tablet   Oral   Take 600 mg by mouth every 6 (six) hours as needed for headache.         [name]  lisinopril (PRINIVIL,ZESTRIL) 40 MG tablet   Oral   Take 1 tablet (40 mg total) by mouth daily.   30 tablet   1   . megestrol (MEGACE) 40 MG tablet   Oral   Take 1 tablet (40 mg total) by mouth 2 (two) times daily.   20 tablet   0   . pravastatin (PRAVACHOL) 20 MG tablet   Oral   Take 1 tablet (20 mg total) by mouth daily.   30 tablet   1   . Rivaroxaban (XARELTO) 15 MG TABS tablet   Oral   Take 1 tablet (15 mg total) by mouth 2 (two) times daily with a meal. For 21 days   42 tablet   0   . Rivaroxaban (XARELTO) 20 MG TABS tablet   Oral   Take 1 tablet (20 mg total) by mouth daily with supper. To be started once 15mg  tablets are complete   30 tablet   1    Triage Vitals: BP 130/59  Pulse 98  Temp(Src) 98.5 F (36.9 C) (Oral)  Resp 22  Ht 5\' 9"  (1.753 m)  Wt 395 lb (179.171 kg)   BMI 58.30 kg/m2  SpO2 96%  LMP 03/02/2014  Physical Exam  Nursing note and vitals reviewed. Constitutional: She is oriented to person, place, and time. She appears well-developed and well-nourished.  HENT:  Head: Normocephalic.  Eyes: EOM are normal.  Neck: Normal range of motion.  Cardiovascular: Normal rate and regular rhythm.   Murmur heard. Systolic ejection murmur, best heard at the left, upper, sternal border.   Pulmonary/Chest: Effort normal and breath sounds normal. No respiratory distress. She has no wheezes.  Abdominal: She exhibits no distension.  Musculoskeletal: Normal range of motion.  Neurological: She is alert and oriented to person, place, and time.  Psychiatric: She has a normal mood and affect.    ED Course  Procedures (including critical care time)  CRITICAL CARE Performed by: Erin Sexton Total critical care time: 35 Critical care time was exclusive of separately billable procedures and treating other patients. Critical care was necessary to treat or prevent imminent or life-threatening deterioration. Critical care was time spent personally by me on the following activities: development of treatment plan with patient and/or surrogate as well as nursing, discussions with consultants, evaluation of patient's response to treatment, examination of patient, obtaining history from patient or surrogate, ordering and performing treatments and interventions, ordering and review of laboratory studies, ordering and review of radiographic studies, pulse oximetry and re-evaluation of patient's condition.   DIAGNOSTIC STUDIES: Oxygen Saturation is 96% on room air, normal by my interpretation.    COORDINATION OF CARE: 12:26 AM- Ordered EKG, CBC, CMP, UA, and a chest x-ray. Will order additional labs (Vitamin B12, Folate, iron and TIBC, Ferritin, reticulocytes, and hCG). Discussed that the patient will need to be admitted to the hospital to receive a blood transfusion.  Discussed treatment plan with patient at bedside and patient verbalized agreement.     Labs Review Labs Reviewed  CBC WITH DIFFERENTIAL - Abnormal; Notable for the following:    WBC 11.0 (*)    RBC 2.78 (*)    Hemoglobin 7.3 (*)    HCT 22.5 (*)    Neutro Abs 7.8 (*)    All other components within normal limits  COMPREHENSIVE METABOLIC PANEL - Abnormal; Notable for the following:    Glucose, Bld 258 (*)    Albumin 3.1 (*)    Total Bilirubin 0.2 (*)  All other components within normal limits  RETICULOCYTES - Abnormal; Notable for the following:    Retic Ct Pct 3.6 (*)    RBC. 2.81 (*)    All other components within normal limits  APTT - Abnormal; Notable for the following:    aPTT 49 (*)    All other components within normal limits  TROPONIN I  HCG, QUANTITATIVE, PREGNANCY  URINALYSIS, ROUTINE W REFLEX MICROSCOPIC  PREGNANCY, URINE  VITAMIN B12  FOLATE  IRON AND TIBC  FERRITIN  TYPE AND SCREEN  PREPARE RBC (CROSSMATCH)   Imaging Review Dg Chest Portable 1 View  03/05/2014   CLINICAL DATA:  Shortness of breath, chest pain, vaginal bleeding.  EXAM: PORTABLE CHEST - 1 VIEW  COMPARISON:  CT ANGIO CHEST W/CM &/OR WO/CM dated 03/02/2014; DG CHEST 2 VIEW dated 03/02/2014  FINDINGS: The cardiac silhouette is upper limits of nor size, mediastinal silhouette is nonsuspicious for this low inspiratory portable examination with crowded vasculature markings.The lungs are clear without pleural effusions or focal consolidations. Trachea projects midline and there is no pneumothorax. Included soft tissue planes and osseous structures are non-suspicious. Moderate degenerative change of thoracic spine.  IMPRESSION: Borderline cardiomegaly, no acute pulmonary process.   Electronically Signed   By: Elon Alas   On: 03/05/2014 22:52  I personally reviewed the imaging tests through PACS system I reviewed available ER/hospitalization records through the EMR    EKG  Interpretation   Date/Time:  Saturday March 05 2014 22:40:13 EDT Ventricular Rate:  96 PR Interval:  144 QRS Duration: 100 QT Interval:  376 QTC Calculation: 475 R Axis:   -9 Text Interpretation:  Normal sinus rhythm Left ventricular hypertrophy  with repolarization abnormality Abnormal ECG When compared with ECG of  02-Mar-2014 12:44, No significant change was found Confirmed by Tammee Thielke   MD, Lige Lakeman ([id number]) on 03/06/2014 12:26:30 AM      MDM   Final diagnoses:  Anemia  Vaginal bleeding  Dyspnea  Pulmonary emboli    Patient is symptomatic anemia this time with ongoing shortness of breath.  Her hemoglobin is dropped from yesterday.  Hemoglobin yesterday was in the 9-7.3 today.  She looks pale.  She has ongoing exertional shortness of breath.  Hemoglobin is 7.3 and ongoing vaginal bleeding in the setting of anticoagulation the patient will be transfused blood at this time.  The patient is on xarelto.   1:23 AM Spoke with Dr Harolyn Rutherford who recommends increasing megace to 80 mg TID until bleeding stops     I personally performed the services described in this documentation, which was scribed in my presence. The recorded information has been reviewed and is accurate.       Erin Morn, MD 03/06/14 [phone]

## 2014-03-06 NOTE — Consult Note (Addendum)
Admit date: 03/05/2014 Referring Physician  Dr. Sloan Leiter Primary Physician No primary provider on file. Primary Cardiologist  None Reason for Consultation  Chest pain  HPI: 43 year-old female with morbid obesity weighing 392 pounds with complaints of retrosternal chest discomfort, shortness of breath, palpitations with recent admission secondary to heavy vaginal bleeding after being started on anticoagulation for possible small pulmonary embolus seen on CT scan. Lower extremity Dopplers were negative.  She had been having occasional chest discomfort off and on over the past 2-3 weeks she thinks. She also will feel palpitations at rest lasting a few seconds in duration. Her mother interestingly was diagnosed with WPW by Dr. Lattie Haw.  Xarelto has been stopped at the request of OB/GYN, transfusion is currently being performed. Telemetry has demonstrated very short runs of SVT, narrow complex tachycardia approximately 6 beats duration. Asymptomatic.  Dr. Gwenette Greet with pulmonary medicine did not feel strongly that pulmonary embolism was present based on CT results. His dictation is currently pending. Please see dictation for full details. This was reported to me by Dr. Sloan Leiter.  PMH:   Past Medical History  Diagnosis Date  . COPD (chronic obstructive pulmonary disease)   . Depression   . Hypertension   . Asthma   . Anxiety   . Anemia   . Pulmonary emboli   . Blood transfusion without reported diagnosis     PSH:  History reviewed. No pertinent past surgical history. Allergies:  Review of patient's allergies indicates no known allergies. Prior to Admit Meds:   Prior to Admission medications   Medication Sig Start Date End Date Taking? Authorizing Provider  albuterol (PROVENTIL HFA;VENTOLIN HFA) 108 (90 BASE) MCG/ACT inhaler Inhale 2 puffs into the lungs every 6 (six) hours as needed for wheezing or shortness of breath.    Historical Provider, MD  albuterol (PROVENTIL) (2.5 MG/3ML) 0.083%  nebulizer solution Take 3 mLs (2.5 mg total) by nebulization every 6 (six) hours as needed for wheezing or shortness of breath. 03/03/14   Kathie Dike, MD  buPROPion (WELLBUTRIN XL) 150 MG 24 hr tablet Take 1 tablet (150 mg total) by mouth daily. 03/03/14   Kathie Dike, MD  fluticasone (FLOVENT HFA) 110 MCG/ACT inhaler Inhale 2 puffs into the lungs 2 (two) times daily. 03/03/14   Kathie Dike, MD  hydrochlorothiazide (HYDRODIURIL) 25 MG tablet Take 1 tablet (25 mg total) by mouth daily. 03/03/14   Kathie Dike, MD  ibuprofen (ADVIL,MOTRIN) 200 MG tablet Take 600 mg by mouth every 6 (six) hours as needed for headache.    Historical Provider, MD  lisinopril (PRINIVIL,ZESTRIL) 40 MG tablet Take 1 tablet (40 mg total) by mouth daily. 03/03/14   Kathie Dike, MD  megestrol (MEGACE) 40 MG tablet Take 1 tablet (40 mg total) by mouth 2 (two) times daily. 03/04/14   Charles B. Karle Starch, MD  pravastatin (PRAVACHOL) 20 MG tablet Take 1 tablet (20 mg total) by mouth daily. 03/03/14   Kathie Dike, MD  Rivaroxaban (XARELTO) 15 MG TABS tablet Take 1 tablet (15 mg total) by mouth 2 (two) times daily with a meal. For 21 days 03/03/14   Kathie Dike, MD  Rivaroxaban (XARELTO) 20 MG TABS tablet Take 1 tablet (20 mg total) by mouth daily with supper. To be started once 15mg  tablets are complete 03/03/14   Kathie Dike, MD   Fam HX:   History reviewed. No pertinent family history. No early family history of CAD.  Social HX:    History   Social History  .  Marital Status: Married    Spouse Name: N/A    Number of Children: N/A  . Years of Education: N/A   Occupational History  . Not on file.   Social History Main Topics  . Smoking status: Never Smoker   . Smokeless tobacco: Never Used  . Alcohol Use: No  . Drug Use: No  . Sexual Activity: Not on file   Other Topics Concern  . Not on file   Social History Narrative  . No narrative on file     ROS: Positive for vaginal bleeding, chest pain,  shortness of breath, exertional dyspnea, denies any strokelike symptoms. All 11 ROS were addressed and are negative except what is stated in the HPI   Physical Exam: Blood pressure 121/53, pulse 100, temperature 98.5 F (36.9 C), temperature source Oral, resp. rate 17, height 5\' 9"  (1.753 m), weight 392 lb 10.2 oz (178.1 kg), last menstrual period 03/02/2014, SpO2 99.00%.   General: Well developed, well nourished, in no acute distress Head: Eyes PERRLA, No xanthomas.   Normal cephalic and atramatic  Lungs:   Clear bilaterally to auscultation and percussion. Normal respiratory effort. No wheezes, no rales. Heart:   HRRR S1 S2 Pulses are 2+ & equal. 3/6 SM RUSB, rubs, gallops.  No carotid bruit. No appreciable JVD.  No abdominal bruits.  Abdomen: Bowel sounds are positive, abdomen soft and non-tender without masses. No hepatosplenomegaly. Morbidly obese Msk:  Back normal. Normal strength and tone for age. Extremities:  No clubbing, cyanosis, 1+ edema.  DP +1 Neuro: Alert and oriented X 3, non-focal, MAE x 4 GU: Deferred Rectal: Deferred Psych:  Good affect, responds appropriately      Labs: Lab Results  Component Value Date   WBC 13.0* 03/06/2014   HGB 8.3* 03/06/2014   HCT 25.3* 03/06/2014   MCV 80.6 03/06/2014   PLT 311 03/06/2014     Recent Labs Lab 03/05/14 2259  NA 138  K 3.9  CL 102  CO2 27  BUN 16  CREATININE 0.75  CALCIUM 8.6  PROT 6.4  BILITOT 0.2*  ALKPHOS 87  ALT 20  AST 16  GLUCOSE 258*    Recent Labs  03/06/14 0023  TROPONINI <0.30   No results found for this basename: CHOL, HDL, LDLCALC, TRIG   Lab Results  Component Value Date   DDIMER 1.32* 03/02/2014     Radiology:  US Transvaginal Non-ob  03/06/2014   CLINICAL DATA:  Abnormal vaginal bleeding.  EXAM: TRANSABDOMINAL AND TRANSVAGINAL ULTRASOUND OF PELVIS  TECHNIQUE: Both transabdominal and transvaginal ultrasound examinations of the pelvis were performed. Transabdominal technique was performed  for global imaging of the pelvis including uterus, ovaries, adnexal regions, and pelvic cul-de-sac. It was necessary to proceed with endovaginal exam following the transabdominal exam to visualize the uterus and ovaries.  COMPARISON:  None  FINDINGS: Uterus  Measurements: 10.0 x 5.9 x 5.7 cm. Mildly heterogeneous myometrium without evidence of focal mass.  Endometrium  Thickness: 15 mm. There is a small amount of fluid/ blood in the endometrial cavity. The endometrium is homogeneously thickened which may be consistent with secretory endometrium. No obvious polypoid lesions are identified.  Right ovary  Measurements: 2.7 x 1.4 x 2.3 cm. Normal appearance/no adnexal mass.  Other findings  No free fluid.  IMPRESSION: Small amount of fluid/ blood in the endometrial canal with homogeneously thickened endometrium. No focal masses are identified. The left ovary is not visualized by ultrasound.   Electronically Signed   By: Eulas Post  Kathlene Cote M.D.   On: 03/06/2014 11:02   US Pelvis Complete  03/06/2014   CLINICAL DATA:  Abnormal vaginal bleeding.  EXAM: TRANSABDOMINAL AND TRANSVAGINAL ULTRASOUND OF PELVIS  TECHNIQUE: Both transabdominal and transvaginal ultrasound examinations of the pelvis were performed. Transabdominal technique was performed for global imaging of the pelvis including uterus, ovaries, adnexal regions, and pelvic cul-de-sac. It was necessary to proceed with endovaginal exam following the transabdominal exam to visualize the uterus and ovaries.  COMPARISON:  None  FINDINGS: Uterus  Measurements: 10.0 x 5.9 x 5.7 cm. Mildly heterogeneous myometrium without evidence of focal mass.  Endometrium  Thickness: 15 mm. There is a small amount of fluid/ blood in the endometrial cavity. The endometrium is homogeneously thickened which may be consistent with secretory endometrium. No obvious polypoid lesions are identified.  Right ovary  Measurements: 2.7 x 1.4 x 2.3 cm. Normal appearance/no adnexal mass.  Other  findings  No free fluid.  IMPRESSION: Small amount of fluid/ blood in the endometrial canal with homogeneously thickened endometrium. No focal masses are identified. The left ovary is not visualized by ultrasound.   Electronically Signed   By: Aletta Edouard M.D.   On: 03/06/2014 11:02   Dg Chest Portable 1 View  03/05/2014   CLINICAL DATA:  Shortness of breath, chest pain, vaginal bleeding.  EXAM: PORTABLE CHEST - 1 VIEW  COMPARISON:  CT ANGIO CHEST W/CM &/OR WO/CM dated 03/02/2014; DG CHEST 2 VIEW dated 03/02/2014  FINDINGS: The cardiac silhouette is upper limits of nor size, mediastinal silhouette is nonsuspicious for this low inspiratory portable examination with crowded vasculature markings.The lungs are clear without pleural effusions or focal consolidations. Trachea projects midline and there is no pneumothorax. Included soft tissue planes and osseous structures are non-suspicious. Moderate degenerative change of thoracic spine.  IMPRESSION: Borderline cardiomegaly, no acute pulmonary process.   Electronically Signed   By: Elon Alas   On: 03/05/2014 22:52   Personally viewed.  EKG:  03/06/14-sinus rhythm, left axis deviation, left ventricular hypertrophy, otherwise no significant ST segment changes. When compared to 3/28, isolated T wave inversion in aVL was noted on 3/28. Nondiagnostic. Personally viewed.   Echocardiogram: 03/06/14: Hyperdynamic EF of 70% with elevated outflow tract velocity of 7.9 m/s consistent with hypertrophic obstructive cardiomyopathy (HOCM).   ASSESSMENT/PLAN:    43 year old female with morbid obesity, possible but not likely small pulmonary embolism with echocardiogram consistent with HOCM. Other comorbidities include vaginal bleeding, anemia.  1. HOCM/Chest pain/SVT  - She would benefit from beta blocker. We will start metoprolol tartrate 25 mg 4 times a day. Beta blocker will help decrease heart rate, decrease outflow tract gradient and should also help with  symptoms.  - EKG is unremarkable. Troponin is normal several occasions.  - Unfortunately, given body habitus there is no specific stress test modality that would be useful other than possibly PET scan which is sometimes done at [facility].  - I would avoid invasive testing, i.e. cardiac catheterization, unless objective evidence arises such as elevated troponin or dramatic EKG changes. For now, I would treat with antianginal medication metoprolol. - It would be wise to avoid excessive nitrates given her elevated outflow tract velocity as decreasing afterload may result any worsening gradient.  We will follow along with you.  Candee Furbish, MD  03/06/2014  5:16 PM

## 2014-03-06 NOTE — ED Notes (Signed)
Erin Schlein RN report given to

## 2014-03-06 NOTE — Progress Notes (Signed)
Informed by RN patient was complaining of retrosternal chest pain, SOB and palpitations. Given IV Morphine with good relief. EKG without acute changes. Per RN continues to have heavy vaginal bleeding. I have subsequently seen and examined, the patient. Now pain free. Reviewed Tele strips-very short run of Narrow Complex tachycardia as well. Will cycle enzymes, transfuse 2 more units of PRBC. Cardiology consult placed, will try to get in touch with GYN as well-currently no answer on-call number.

## 2014-03-06 NOTE — Progress Notes (Signed)
  Echocardiogram 2D Echocardiogram has been performed.  Mauricio Po 03/06/2014, 5:06 PM

## 2014-03-06 NOTE — Progress Notes (Addendum)
PATIENT DETAILS Name: Erin Sexton Age: 43 y.o. Sex: female Date of Birth: 13-Dec-1970 Admit Date: 03/05/2014 Admitting Physician Bonnielee Haff, MD PCP:No primary provider on file.  Subjective: No major issues-vaginal bleeding continues but seems to have slowed down.  Assessment/Plan: Principal Problem:   Vaginal bleeding -Etiology not certain, but seems to be slowing down with Megace -Pelvic/Transvaginal ultrasound-showes homogeneously thickened endometrium - d/w GYN by admitting MD-apart from Megace no other recommendations at this time. -off anticoagulation at present  Active Problems: Acute Blood Loss anemia -secondary to above -transfused 2 units of PRBC on 3/29-Hb post transfusion Hb 8.3 today, will continue to monitor periodically  Suspected Pul Embolism -diagnoses uncertain-presented to [facility] on 3/25 with SOB-CT Ango-poor qualit due to weight showed a possible small PE and was discharged home with Xarelto. Continued to have SOB unchanged at home, then started to have significant vaginal bleeding as well.  -Given significant acute blood loss anemia, Xarelto currently on hold-further work up [employer], Lower ext Doppler and VQ Scan-await PCCM eval.  -Continue holding Xarelto for now, she is not hypoxic/tachycardic or with chest pain at this time. Although she could have had a small PE-anemia, Obesity, hx of asthma can explain some of her initial shortness of breath  Dyspnea -mostly on exertion -although possible that she may have a small PE-not very convinced given lack of hypoxia and other features. Given her degree of exertional dyspnea you would expect a large clot burden. As noted in the H&P-anemia, Obesity, Asthma could all explain her Dyspnea. Await further work up and PCCM eval  HTN -BP controlled-off anti-hypertensives at present  Morbid obesity: weight loss counseling.  Hyperglycemia -?DM-check A1C  Disposition: Remain inpatient  DVT  Prophylaxis:  SCD's if Doppler is negative  Code Status: Full code  Family Communication None present  Procedures:  None  CONSULTS:  pulmonary/intensive care  Time spent 40 minutes-which includes 50% of the time with face-to-face with patient/ family and coordinating care related to the above assessment and plan.    MEDICATIONS: Scheduled Meds: . buPROPion  150 mg Oral Daily  . Chlorhexidine Gluconate Cloth  6 each Topical Q0600  . fluticasone  2 puff Inhalation BID  . megestrol  80 mg Oral TID  . mupirocin ointment  1 application Nasal BID  . sodium chloride  3 mL Intravenous Q12H  . sodium chloride  3 mL Intravenous Q12H   Continuous Infusions:  PRN Meds:.sodium chloride, acetaminophen, acetaminophen, albuterol, ondansetron (ZOFRAN) IV, ondansetron, sodium chloride  Antibiotics: Anti-infectives   None       PHYSICAL EXAM: Vital signs in last 24 hours: Filed Vitals:   03/06/14 0525 03/06/14 0610 03/06/14 0710 03/06/14 0725  BP: 111/50 110/50 110/50 119/50  Pulse: 93 90 88 91  Temp:  97.9 F (36.6 C) 98.4 F (36.9 C) 98.7 F (37.1 C)  TempSrc:  Oral Oral Oral  Resp: 18 27 10 17   Height:      Weight:      SpO2: 100% 98% 98% 96%    Weight change:  Filed Weights   03/05/14 2210 03/06/14 0519  Weight: 179.171 kg (395 lb) 178.1 kg (392 lb 10.2 oz)   Body mass index is 57.96 kg/(m^2).   Gen Exam: Awake and alert with clear speech.   Neck: Supple, No JVD.   Chest: B/L Clear.   CVS: S1 S2 Regular, systolic murmur.  Abdomen: soft, BS +, non tender, non distended.  Extremities: no edema, lower  extremities warm to touch. Neurologic: Non Focal.   Skin: No Rash.   Wounds: N/A.   Intake/Output from previous day:  Intake/Output Summary (Last 24 hours) at 03/06/14 1245 Last data filed at 03/06/14 0930  Gross per 24 hour  Intake   1405 ml  Output      0 ml  Net   1405 ml     LAB RESULTS: CBC  Recent Labs Lab 03/02/14 1333 03/03/14 0200  03/04/14 1550 03/05/14 2259 03/06/14 0925  WBC 9.7 10.0 9.8 11.0* 13.0*  HGB 9.7* 9.1* 9.9* 7.3* 8.3*  HCT 30.8* 28.7* 31.6* 22.5* 25.3*  PLT 277 270 411* 306 311  MCV 81.3 81.1 81.7 80.9 80.6  MCH 25.6* 25.7* 25.6* 26.3 26.4  MCHC 31.5 31.7 31.3 32.4 32.8  RDW 14.2 14.2 14.5 14.5 15.2  LYMPHSABS 1.8  --  1.9 2.4  --   MONOABS 0.5  --  0.4 0.6  --   EOSABS 0.4  --  0.4 0.3  --   BASOSABS 0.0  --  0.0 0.0  --     Chemistries   Recent Labs Lab 03/02/14 1333 03/04/14 1550 03/05/14 2259  NA 143 143 138  K 4.3 4.0 3.9  CL 107 102 102  CO2 28 30 27   GLUCOSE 113* 149* 258*  BUN 15 17 16   CREATININE 0.82 0.83 0.75  CALCIUM 8.6 8.9 8.6    CBG: No results found for this basename: GLUCAP,  in the last 168 hours  GFR Estimated Creatinine Clearance: 155.6 ml/min (by C-G formula based on Cr of 0.75).  Coagulation profile  Recent Labs Lab 03/02/14 1500 03/04/14 1550  INR 1.11 1.66*    Cardiac Enzymes  Recent Labs Lab 03/03/14 0200 03/03/14 0731 03/06/14 0023  TROPONINI <0.30 <0.30 <0.30    No components found with this basename: POCBNP,  No results found for this basename: DDIMER,  in the last 72 hours No results found for this basename: HGBA1C,  in the last 72 hours No results found for this basename: CHOL, HDL, LDLCALC, TRIG, CHOLHDL, LDLDIRECT,  in the last 72 hours No results found for this basename: TSH, T4TOTAL, FREET3, T3FREE, THYROIDAB,  in the last 72 hours  Recent Labs  03/06/14 0023  RETICCTPCT 3.6*   No results found for this basename: LIPASE, AMYLASE,  in the last 72 hours  Urine Studies No results found for this basename: UACOL, UAPR, USPG, UPH, UTP, UGL, UKET, UBIL, UHGB, UNIT, UROB, ULEU, UEPI, UWBC, URBC, UBAC, CAST, CRYS, UCOM, BILUA,  in the last 72 hours  MICROBIOLOGY: Recent Results (from the past 240 hour(s))  MRSA PCR SCREENING     Status: Abnormal   Collection Time    03/06/14  5:40 AM      Result Value Ref Range Status   MRSA  by PCR POSITIVE (*) NEGATIVE Final   Comment:            The GeneXpert MRSA Assay (FDA     approved for NASAL specimens     only), is one component of a     comprehensive MRSA colonization     surveillance program. It is not     intended to diagnose MRSA     infection nor to guide or     monitor treatment for     MRSA infections.     RESULT CALLED TO, READ BACK BY AND VERIFIED WITH:     IRBY,T RN [id number] 03/06/14 MITCHELL,L    RADIOLOGY STUDIES/RESULTS: Dg  Chest 2 View  03/02/2014   CLINICAL DATA:  Short of breath for few days.  History of asthma.  EXAM: CHEST  2 VIEW  COMPARISON:  DG CHEST 2 VIEW dated 01/30/2007  FINDINGS: Lateral view degraded by patient arm position. Lateral view is also a minimally motion degraded. Moderate thoracic spondylosis. Midline trachea. Borderline cardiomegaly. Mediastinal contours otherwise within normal limits. No pleural effusion or pneumothorax. Clear lungs.  IMPRESSION: Borderline cardiomegaly, without acute disease.   Electronically Signed   By: Abigail Miyamoto M.D.   On: 03/02/2014 13:20   Ct Angio Chest Pe W/cm &/or Wo Cm  03/02/2014   CLINICAL DATA:  Shortness of breath.  EXAM: CT ANGIOGRAPHY CHEST WITH CONTRAST  TECHNIQUE: Multidetector CT imaging of the chest was performed using the standard protocol during bolus administration of intravenous contrast. Multiplanar CT image reconstructions and MIPs were obtained to evaluate the vascular anatomy.  CONTRAST:  131mL OMNIPAQUE IOHEXOL 350 MG/ML SOLN  COMPARISON:  DG CHEST 2 VIEW dated 03/02/2014  FINDINGS: Thoracic aorta is unremarkable. 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 The this can occasionally result in dysphagia. This is a normal variant. Pulmonary arteries are difficult to evaluate due to patient size and phase of enhancement. Tiny bilateral pulmonary emboli cannot be excluded. No major pulmonary artery emboli are present. Cardiomegaly. Coronary artery disease cannot be  excluded. No pericardial effusion.  Shotty mediastinal lymph nodes. No pathologic adenopathy is CT size criteria. Thoracic esophagus is unremarkable. Adrenals unremarkable. Visualized upper abdominal viscera unremarkable.  Large airways patent. Mild pleural thickening noted consistent scarring. Mild basilar atelectasis.  Thyroid normal size. No significant axillary adenopathy. Chest wall is intact. Degenerative changes lumbar spine. No acute bony abnormality.  Review of the MIP images confirms the above findings.  IMPRESSION: 1. Limited study. Cannot exclude very tiny bilateral pulmonary emboli. 2. Aberrant right subclavian artery, this is a normal finding. This can result and mild esophageal compression and dysplasia. 3. Cardiomegaly. Coronary artery disease cannot be excluded. Critical Value/emergent results were called by telephone at the time of interpretation on 03/02/2014 at 4:40 PM to Dr. Mikeal Hawthorne , who verbally acknowledged these results.   Electronically Signed   By: Marcello Moores  Register   On: 03/02/2014 16:43   US Transvaginal Non-ob  03/06/2014   CLINICAL DATA:  Abnormal vaginal bleeding.  EXAM: TRANSABDOMINAL AND TRANSVAGINAL ULTRASOUND OF PELVIS  TECHNIQUE: Both transabdominal and transvaginal ultrasound examinations of the pelvis were performed. Transabdominal technique was performed for global imaging of the pelvis including uterus, ovaries, adnexal regions, and pelvic cul-de-sac. It was necessary to proceed with endovaginal exam following the transabdominal exam to visualize the uterus and ovaries.  COMPARISON:  None  FINDINGS: Uterus  Measurements: 10.0 x 5.9 x 5.7 cm. Mildly heterogeneous myometrium without evidence of focal mass.  Endometrium  Thickness: 15 mm. There is a small amount of fluid/ blood in the endometrial cavity. The endometrium is homogeneously thickened which may be consistent with secretory endometrium. No obvious polypoid lesions are identified.  Right ovary  Measurements: 2.7 x 1.4  x 2.3 cm. Normal appearance/no adnexal mass.  Other findings  No free fluid.  IMPRESSION: Small amount of fluid/ blood in the endometrial canal with homogeneously thickened endometrium. No focal masses are identified. The left ovary is not visualized by ultrasound.   Electronically Signed   By: Aletta Edouard M.D.   On: 03/06/2014 11:02   US Pelvis Complete  03/06/2014   CLINICAL DATA:  Abnormal vaginal bleeding.  EXAM: TRANSABDOMINAL AND TRANSVAGINAL ULTRASOUND OF PELVIS  TECHNIQUE: Both transabdominal and transvaginal ultrasound examinations of the pelvis were performed. Transabdominal technique was performed for global imaging of the pelvis including uterus, ovaries, adnexal regions, and pelvic cul-de-sac. It was necessary to proceed with endovaginal exam following the transabdominal exam to visualize the uterus and ovaries.  COMPARISON:  None  FINDINGS: Uterus  Measurements: 10.0 x 5.9 x 5.7 cm. Mildly heterogeneous myometrium without evidence of focal mass.  Endometrium  Thickness: 15 mm. There is a small amount of fluid/ blood in the endometrial cavity. The endometrium is homogeneously thickened which may be consistent with secretory endometrium. No obvious polypoid lesions are identified.  Right ovary  Measurements: 2.7 x 1.4 x 2.3 cm. Normal appearance/no adnexal mass.  Other findings  No free fluid.  IMPRESSION: Small amount of fluid/ blood in the endometrial canal with homogeneously thickened endometrium. No focal masses are identified. The left ovary is not visualized by ultrasound.   Electronically Signed   By: Aletta Edouard M.D.   On: 03/06/2014 11:02   US Venous Img Lower Bilateral  03/02/2014   CLINICAL DATA:  Equivocal chest CTA  EXAM: Bilateral LOWER EXTREMITY VENOUS DOPPLER ULTRASOUND  TECHNIQUE: Gray-scale sonography with graded compression, as well as color Doppler and duplex ultrasound, were performed to evaluate the deep venous system from the level of the common femoral vein through  the popliteal and proximal calf veins. Spectral Doppler was utilized to evaluate flow at rest and with distal augmentation maneuvers.  COMPARISON:  None.  FINDINGS: Thrombus within deep veins:  None visualized.  Compressibility of deep veins:  Normal.  Duplex waveform respiratory phasicity:  Normal.  Duplex waveform response to augmentation:  Normal.  Venous reflux:  None visualized.  Other findings:  None visualized.  IMPRESSION: No deep venous thrombosis is noted bilaterally.   Electronically Signed   By: Inez Catalina M.D.   On: 03/02/2014 17:59   Dg Chest Portable 1 View  03/05/2014   CLINICAL DATA:  Shortness of breath, chest pain, vaginal bleeding.  EXAM: PORTABLE CHEST - 1 VIEW  COMPARISON:  CT ANGIO CHEST W/CM &/OR WO/CM dated 03/02/2014; DG CHEST 2 VIEW dated 03/02/2014  FINDINGS: The cardiac silhouette is upper limits of nor size, mediastinal silhouette is nonsuspicious for this low inspiratory portable examination with crowded vasculature markings.The lungs are clear without pleural effusions or focal consolidations. Trachea projects midline and there is no pneumothorax. Included soft tissue planes and osseous structures are non-suspicious. Moderate degenerative change of thoracic spine.  IMPRESSION: Borderline cardiomegaly, no acute pulmonary process.   Electronically Signed   By: Elon Alas   On: 03/05/2014 22:52    Oren Binet, MD  [facility] Pager:[phone]  If 7PM-7AM, please contact night-coverage [email or web address] Password [email or web address] 03/06/2014, 12:45 PM   LOS: 1 day

## 2014-03-06 NOTE — Consult Note (Signed)
NAME:  Erin Sexton, ISIP NO.:  [id number]  MEDICAL RECORD NO.:  [id number]  LOCATION:  3S02C                        FACILITY:  [facility]  PHYSICIAN:  Kathee Delton, MD,FCCPDATE OF BIRTH:  [birth year]1969  DATE OF CONSULTATION:  03/06/2014 DATE OF DISCHARGE:                                CONSULTATION   REFERRING PHYSICIAN:  [facility].  HISTORY OF PRESENT ILLNESS:  The patient is a morbidly obese 43 year old female who I have been asked to see for dyspnea.  The patient states that she has had shortness of breath for as long as she can remember, but over the last few years has been progressive in nature.  The patient is morbidly obese, but states that her weight has been stable over the last year or 2.  She was recently hospitalized approximately 4 days ago with worsening shortness of breath and chest pressure, but was not noted to have hypoxemia.  She underwent a CT scan of the chest to rule out PE, and it was totally unremarkable.  However, the radiologist felt he could not exclude small peripheral pulmonary emboli because of limited contrast enhancement.  She subsequently underwent lower extremity venous Dopplers with no evidence for DVT seen.  Because of the concern for venous thromboembolism, she was started on Xarelto and discharged, however, then began to have vaginal bleeding.  She then presented to the emergency room again with severe anemia.  The patient has continued to have shortness of breath and chest discomfort and we were asked to see her.  A ventilation perfusion scan has been done today, with results pending at the time of this dictation.  The patient states that she does have a history of asthma, but has never smoked.  She tells me that she carries the diagnosis of "COPD," and has had pulmonary function studies in the distant past.  She is on an inhaled steroids with as needed albuterol for her asthma, and has not had a classic flare-up  recently. She denies any significant cough or mucous production, and also denies reflux disease.  She is not had a cardiac workup in the past.  PAST MEDICAL HISTORY: 1. Significant for asthma. 2. History of hypertension. 3. History of depression. 4. History of anxiety. 5. History of anemia.  ALLERGIES:  The patient has no known drug allergies.  SOCIAL HISTORY:  She lives in [place].  She has never smoked and does not drink or use illicit drugs.  FAMILY HISTORY:  Totally unremarkable.  REVIEW OF SYSTEMS:  A 12-point review of systems is unremarkable except for that per history of present illness.  PHYSICAL EXAMINATION:  GENERAL:  She is a morbidly obese female in no acute distress, she was complaining of chest pressure at the time of my visit. VITAL SIGNS:  Blood pressure 121/53, pulse 100, respiratory rate 16. She is afebrile.  Oxygen saturation on 2L is 99%. HEENT:  Pupils equal, round, reactive to light and accommodation. Extraocular muscles are intact.  Nares are patent without Discharge. Oropharynx is clear. NECK:  Difficult to examine for JVD.  There is no obvious lymphadenopathy or thyromegaly. CHEST:  Reveals totally clear breath sounds with no wheezes or crackles. CARDIAC:  Reveals  a regular rate and rhythm, but 3/6 systolic murmur that was quite loud. ABDOMEN:  Large, but soft and nontender, bowel sounds are present. GENITAL EXAM, RECTAL EXAM, BREAST EXAM:  Not done and not indicated. LOWER EXTREMITIES:  With 1+ edema, no cyanosis.  Pulses were intact distally. NEUROLOGIC:  The patient is alert and oriented and moves all 4 extremities without obvious deficits.  IMPRESSION:  Dyspnea on exertion that I suspect is multifactorial.  The patient is morbidly obese and clearly deconditioned, but she feels this has been progressive.  The family states that she cannot even walk through the house without becoming severely short of breath.  The patient has a history of  asthma, but it is very unlikely this is the problem.  She is moving air well today with no wheezing.  She would benefit from pulmonary function studies for re-evaluation.  I would find it very unlikely that she has venous thromboembolism, given the negative CT angio and negative lower extremity Dopplers.  She did have a ventilation perfusion scan today with results pending.  Certainly, if this is low probability or normal, I would not pursue the diagnosis further.  I am concerned this may be more of a cardiac issue with her very loud murmur on exam and worsening lower extremity edema.  Also, it is very common for patients who are morbidly obese with a history of hypertension to have significant diastolic dysfunction with volume overload, which can result in her worsening shortness of breath.  She has an echocardiogram ordered at this time.  If she continues to have chest discomfort, she may require some type of assessment of her coronary arteries.  RECOMMENDATIONS: 1. Followup results of ventilation perfusion scan. 2. I agree with echocardiogram, and the patient may need some type of     evaluation of her coronary arteries. 3. Would schedule for full pulmonary function studies to assess more     accurately the degree of airflow obstruction that may or may not be     present. 4. Continue the patient on her usual asthma medications.     Kathee Delton, MD,FCCP     [facility]  D:  03/06/2014  T:  03/06/2014  Job:  [id number]

## 2014-03-06 NOTE — ED Notes (Signed)
Pt resting in bed with nad. Awaiting transfer. Blood infusing without difficulty. Pt color improved.

## 2014-03-06 NOTE — ED Notes (Signed)
Patient needs urine sample, bleeding excessively at this time.

## 2014-03-07 DIAGNOSIS — I421 Obstructive hypertrophic cardiomyopathy: Secondary | ICD-10-CM

## 2014-03-07 LAB — TYPE AND SCREEN
ABO/RH(D): O POS
ABO/RH(D): O POS
Antibody Screen: NEGATIVE
Antibody Screen: NEGATIVE
Unit division: 0
Unit division: 0
Unit division: 0
Unit division: 0

## 2014-03-07 LAB — COMPREHENSIVE METABOLIC PANEL
ALBUMIN: 2.9 g/dL — AB (ref 3.5–5.2)
ALK PHOS: 63 U/L (ref 39–117)
ALT: 15 U/L (ref 0–35)
AST: 15 U/L (ref 0–37)
BILIRUBIN TOTAL: 0.4 mg/dL (ref 0.3–1.2)
BUN: 14 mg/dL (ref 6–23)
CHLORIDE: 103 meq/L (ref 96–112)
CO2: 26 mEq/L (ref 19–32)
Calcium: 8.7 mg/dL (ref 8.4–10.5)
Creatinine, Ser: 0.76 mg/dL (ref 0.50–1.10)
GFR calc Af Amer: >90 mL/min (ref >90)
GFR calc non Af Amer: >90 mL/min (ref >90)
Glucose, Bld: 120 mg/dL — ABNORMAL HIGH (ref 70–99)
POTASSIUM: 4.3 meq/L (ref 3.7–5.3)
Sodium: 139 mEq/L (ref 137–147)
Total Protein: 6 g/dL (ref 6.0–8.3)

## 2014-03-07 LAB — CBC
HCT: 27.5 % — ABNORMAL LOW (ref 36.0–46.0)
Hemoglobin: 9.2 g/dL — ABNORMAL LOW (ref 12.0–15.0)
MCH: 27.2 pg (ref 26.0–34.0)
MCHC: 33.5 g/dL (ref 30.0–36.0)
MCV: 81.4 fL (ref 78.0–100.0)
Platelets: 302 10*3/uL (ref 150–400)
RBC: 3.38 MIL/uL — ABNORMAL LOW (ref 3.87–5.11)
RDW: 15.4 % (ref 11.5–15.5)
WBC: 15.2 10*3/uL — AB (ref 4.0–10.5)

## 2014-03-07 LAB — PRO B NATRIURETIC PEPTIDE
Pro B Natriuretic peptide (BNP): 306.1 pg/mL — ABNORMAL HIGH (ref 0–125)
Pro B Natriuretic peptide (BNP): 264.6 pg/mL — ABNORMAL HIGH (ref 0–125)

## 2014-03-07 LAB — HEMOGLOBIN A1C
Hgb A1c MFr Bld: 6.7 % — ABNORMAL HIGH (ref <5.7)
Mean Plasma Glucose: 146 mg/dL — ABNORMAL HIGH (ref <117)

## 2014-03-07 LAB — TROPONIN I: Troponin I: <0.3 ng/mL (ref <0.30)

## 2014-03-07 MED ORDER — MEGESTROL ACETATE 40 MG PO TABS
80.0000 mg | ORAL_TABLET | Freq: Every day | ORAL | Status: DC
  Administered 2014-03-07 – 2014-03-08 (×2): 80 mg via ORAL
  Filled 2014-03-07 (×3): qty 2

## 2014-03-07 NOTE — Progress Notes (Signed)
PATIENT DETAILS Name: Erin Sexton Age: 43 y.o. Sex: female Date of Birth: 04/20/1971 Admit Date: 03/05/2014 Admitting Physician Bonnielee Haff, MD PCP:No primary provider on file.  Subjective: Vaginal bleeding slowed down significantly. Feels so much better  Assessment/Plan: Principal Problem:   Vaginal bleeding -Etiology not certain,Pelvic/Transvaginal ultrasound-showes homogeneously thickened endometrium. Patient was admitted and started on Megace 80 mg 3 times a day, continue to have significant vaginal bleeding, was seen officially by GYN on 03/06/14, given IV Premarin. With these measures, patient has significantly improved, hardly any further vaginal bleeding. I have spoken with GYN M.D. on call over the phone, Dr. Hulan Fray, she recommends decreasing Megace to 80 mg a daily. Patient will need endometrial biopsy in the outpatient setting. -off anticoagulation at present  Active Problems: Acute Blood Loss anemia -secondary to above -transfused a total of 4 units of PRBC on 3/29, hemoglobin stable at 9.2. - It appears that the patient has chronic anemia, on initial presentation to the hospital on 3/25 her hemoglobin was 9.7. She will need a further workup to determine the cause of this anemia to be done in the outpatient setting. I have explained this to the patient, she is agreeable.   Suspected Pul Embolism -diagnoses uncertain-presented to [facility] on 3/25 with SOB-CT Ango-poor qualit due to weight showed a possible small PE and was discharged home with Xarelto. Continued to have SOB unchanged at home, then started to have significant vaginal bleeding as well.  -Given significant acute blood loss anemia, Xarelto was placed on hold, since the diagnosis of pulmonary embolism was not certain, she underwent further workup during this hospitalization which included echocardiogram and a VQ scan. Pulmonology was also consulted. VQ scan was of low probability. Recent lower extremity Dopplers  were negative. At this time, PCCM recommends no further workup and no anticoagulation at this time. Retrospectively, chest pain and shortness of breath probably was secondary to anemia, Obesity and underlying HOCM  HOCM - 2-D echocardiogram done on 3/29 (HOCM. Cardiology was consulted, started on beta blocker. Cardiology following.  SVT - Doing well with beta blockers. No further SVT on telemetry.  Chest pain - Suspect secondary to anemia. Cardiac enzymes negative. Per cardiology, current plan is to avoid invasive testing. Continue with beta blocker. Given history of HOCM, would be very cautious with nitrates.  Dyspnea -mostly on exertion. Significantly improved after transfusion of 4 units of PRBC. - Suspect, also these issues are chronic-likely related to obesity, asthma, worsened by recent anemia and acute blood loss. HOCM also likely contributing significantly. Will need sleep study, and PFTs to be done in the outpatient setting.  HTN -BP controlled-with Metoprolol.  Morbid obesity: weight loss counseling.  Hyperglycemia -?DM- A1C pending  Disposition: Remain inpatient-home ?tomorrow  DVT Prophylaxis:  SCD's i  Code Status: Full code  Family Communication Mother at bedside 3/29  Procedures:  None  CONSULTS:  pulmonary/intensive care, Cardiology and GYN  Time spent 40 minutes-which includes 50% of the time with face-to-face with patient/ family and coordinating care related to the above assessment and plan.  MEDICATIONS: Scheduled Meds: . buPROPion  150 mg Oral Daily  . Chlorhexidine Gluconate Cloth  6 each Topical Q0600  . fluticasone  2 puff Inhalation BID  . megestrol  80 mg Oral TID  . metoprolol tartrate  25 mg Oral QID  . mupirocin ointment  1 application Nasal BID  . pantoprazole  40 mg Oral Q1200  . sodium chloride  3 mL  Intravenous Q12H  . sodium chloride  3 mL Intravenous Q12H   Continuous Infusions:  PRN Meds:.sodium chloride, acetaminophen,  acetaminophen, albuterol, conjugated estrogens, gi cocktail, morphine injection, ondansetron (ZOFRAN) IV, ondansetron, sodium chloride  Antibiotics: Anti-infectives   None       PHYSICAL EXAM: Vital signs in last 24 hours: Filed Vitals:   03/07/14 0255 03/07/14 0450 03/07/14 0717 03/07/14 0824  BP: 100/70 104/51 112/47   Pulse: 84 79 79   Temp: 97.5 F (36.4 C) 97.7 F (36.5 C) 97.6 F (36.4 C)   TempSrc: Oral Oral Oral   Resp: 20 26 22    Height:      Weight:      SpO2: 100% 100% 96% 97%    Weight change:  Filed Weights   03/05/14 2210 03/06/14 0519  Weight: 179.171 kg (395 lb) 178.1 kg (392 lb 10.2 oz)   Body mass index is 57.96 kg/(m^2).   Gen Exam: Awake and alert with clear speech.   Neck: Supple, No JVD.   Chest: B/L Clear.   CVS: S1 S2 Regular, systolic murmur.  Abdomen: soft, BS +, non tender, non distended.  Extremities: no edema, lower extremities warm to touch. Neurologic: Non Focal.   Skin: No Rash.   Wounds: N/A.   Intake/Output from previous day:  Intake/Output Summary (Last 24 hours) at 03/07/14 1122 Last data filed at 03/07/14 0255  Gross per 24 hour  Intake   1487 ml  Output      0 ml  Net   1487 ml     LAB RESULTS: CBC  Recent Labs Lab 03/02/14 1333 03/03/14 0200 03/04/14 1550 03/05/14 2259 03/06/14 0925 03/07/14 0352  WBC 9.7 10.0 9.8 11.0* 13.0* 15.2*  HGB 9.7* 9.1* 9.9* 7.3* 8.3* 9.2*  HCT 30.8* 28.7* 31.6* 22.5* 25.3* 27.5*  PLT 277 270 411* 306 311 302  MCV 81.3 81.1 81.7 80.9 80.6 81.4  MCH 25.6* 25.7* 25.6* 26.3 26.4 27.2  MCHC 31.5 31.7 31.3 32.4 32.8 33.5  RDW 14.2 14.2 14.5 14.5 15.2 15.4  LYMPHSABS 1.8  --  1.9 2.4  --   --   MONOABS 0.5  --  0.4 0.6  --   --   EOSABS 0.4  --  0.4 0.3  --   --   BASOSABS 0.0  --  0.0 0.0  --   --     Chemistries   Recent Labs Lab 03/02/14 1333 03/04/14 1550 03/05/14 2259 03/07/14 0352  NA 143 143 138 139  K 4.3 4.0 3.9 4.3  CL 107 102 102 103  CO2 28 30 27 26     GLUCOSE 113* 149* 258* 120*  BUN 15 17 16 14   CREATININE 0.82 0.83 0.75 0.76  CALCIUM 8.6 8.9 8.6 8.7    CBG: No results found for this basename: GLUCAP,  in the last 168 hours  GFR Estimated Creatinine Clearance: 155.6 ml/min (by C-G formula based on Cr of 0.76).  Coagulation profile  Recent Labs Lab 03/02/14 1500 03/04/14 1550  INR 1.11 1.66*    Cardiac Enzymes  Recent Labs Lab 03/06/14 1633 03/07/14 0352 03/07/14 0841  TROPONINI <0.30 <0.30 <0.30    No components found with this basename: POCBNP,  No results found for this basename: DDIMER,  in the last 72 hours No results found for this basename: HGBA1C,  in the last 72 hours No results found for this basename: CHOL, HDL, LDLCALC, TRIG, CHOLHDL, LDLDIRECT,  in the last 72 hours No results found for  this basename: TSH, T4TOTAL, FREET3, T3FREE, THYROIDAB,  in the last 72 hours  Recent Labs  03/06/14 0023  VITAMINB12 959*  FOLATE 13.5  FERRITIN 8*  TIBC 317  IRON 25*  RETICCTPCT 3.6*   No results found for this basename: LIPASE, AMYLASE,  in the last 72 hours  Urine Studies No results found for this basename: UACOL, UAPR, USPG, UPH, UTP, UGL, UKET, UBIL, UHGB, UNIT, UROB, ULEU, UEPI, UWBC, URBC, UBAC, CAST, CRYS, UCOM, BILUA,  in the last 72 hours  MICROBIOLOGY: Recent Results (from the past 240 hour(s))  MRSA PCR SCREENING     Status: Abnormal   Collection Time    03/06/14  5:40 AM      Result Value Ref Range Status   MRSA by PCR POSITIVE (*) NEGATIVE Final   Comment:            The GeneXpert MRSA Assay (FDA     approved for NASAL specimens     only), is one component of a     comprehensive MRSA colonization     surveillance program. It is not     intended to diagnose MRSA     infection nor to guide or     monitor treatment for     MRSA infections.     RESULT CALLED TO, READ BACK BY AND VERIFIED WITH:     IRBY,T RN [phone] 03/06/14 MITCHELL,L    RADIOLOGY STUDIES/RESULTS: Dg Chest 2  View  03/02/2014   CLINICAL DATA:  Short of breath for few days.  History of asthma.  EXAM: CHEST  2 VIEW  COMPARISON:  DG CHEST 2 VIEW dated 01/30/2007  FINDINGS: Lateral view degraded by patient arm position. Lateral view is also a minimally motion degraded. Moderate thoracic spondylosis. Midline trachea. Borderline cardiomegaly. Mediastinal contours otherwise within normal limits. No pleural effusion or pneumothorax. Clear lungs.  IMPRESSION: Borderline cardiomegaly, without acute disease.   Electronically Signed   By: Abigail Miyamoto M.D.   On: 03/02/2014 13:20   Ct Angio Chest Pe W/cm &/or Wo Cm  03/02/2014   CLINICAL DATA:  Shortness of breath.  EXAM: CT ANGIOGRAPHY CHEST WITH CONTRAST  TECHNIQUE: Multidetector CT imaging of the chest was performed using the standard protocol during bolus administration of intravenous contrast. Multiplanar CT image reconstructions and MIPs were obtained to evaluate the vascular anatomy.  CONTRAST:  164mL OMNIPAQUE IOHEXOL 350 MG/ML SOLN  COMPARISON:  DG CHEST 2 VIEW dated 03/02/2014  FINDINGS: Thoracic aorta is unremarkable. 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 The this can occasionally result in dysphagia. This is a normal variant. Pulmonary arteries are difficult to evaluate due to patient size and phase of enhancement. Tiny bilateral pulmonary emboli cannot be excluded. No major pulmonary artery emboli are present. Cardiomegaly. Coronary artery disease cannot be excluded. No pericardial effusion.  Shotty mediastinal lymph nodes. No pathologic adenopathy is CT size criteria. Thoracic esophagus is unremarkable. Adrenals unremarkable. Visualized upper abdominal viscera unremarkable.  Large airways patent. Mild pleural thickening noted consistent scarring. Mild basilar atelectasis.  Thyroid normal size. No significant axillary adenopathy. Chest wall is intact. Degenerative changes lumbar spine. No acute bony abnormality.  Review of the  MIP images confirms the above findings.  IMPRESSION: 1. Limited study. Cannot exclude very tiny bilateral pulmonary emboli. 2. Aberrant right subclavian artery, this is a normal finding. This can result and mild esophageal compression and dysplasia. 3. Cardiomegaly. Coronary artery disease cannot be excluded. Critical Value/emergent results were called  by telephone at the time of interpretation on 03/02/2014 at 4:40 PM to Dr. Mikeal Hawthorne , who verbally acknowledged these results.   Electronically Signed   By: Marcello Moores  Register   On: 03/02/2014 16:43   US Transvaginal Non-ob  03/06/2014   CLINICAL DATA:  Abnormal vaginal bleeding.  EXAM: TRANSABDOMINAL AND TRANSVAGINAL ULTRASOUND OF PELVIS  TECHNIQUE: Both transabdominal and transvaginal ultrasound examinations of the pelvis were performed. Transabdominal technique was performed for global imaging of the pelvis including uterus, ovaries, adnexal regions, and pelvic cul-de-sac. It was necessary to proceed with endovaginal exam following the transabdominal exam to visualize the uterus and ovaries.  COMPARISON:  None  FINDINGS: Uterus  Measurements: 10.0 x 5.9 x 5.7 cm. Mildly heterogeneous myometrium without evidence of focal mass.  Endometrium  Thickness: 15 mm. There is a small amount of fluid/ blood in the endometrial cavity. The endometrium is homogeneously thickened which may be consistent with secretory endometrium. No obvious polypoid lesions are identified.  Right ovary  Measurements: 2.7 x 1.4 x 2.3 cm. Normal appearance/no adnexal mass.  Other findings  No free fluid.  IMPRESSION: Small amount of fluid/ blood in the endometrial canal with homogeneously thickened endometrium. No focal masses are identified. The left ovary is not visualized by ultrasound.   Electronically Signed   By: Aletta Edouard M.D.   On: 03/06/2014 11:02   US Pelvis Complete  03/06/2014   CLINICAL DATA:  Abnormal vaginal bleeding.  EXAM: TRANSABDOMINAL AND TRANSVAGINAL ULTRASOUND OF  PELVIS  TECHNIQUE: Both transabdominal and transvaginal ultrasound examinations of the pelvis were performed. Transabdominal technique was performed for global imaging of the pelvis including uterus, ovaries, adnexal regions, and pelvic cul-de-sac. It was necessary to proceed with endovaginal exam following the transabdominal exam to visualize the uterus and ovaries.  COMPARISON:  None  FINDINGS: Uterus  Measurements: 10.0 x 5.9 x 5.7 cm. Mildly heterogeneous myometrium without evidence of focal mass.  Endometrium  Thickness: 15 mm. There is a small amount of fluid/ blood in the endometrial cavity. The endometrium is homogeneously thickened which may be consistent with secretory endometrium. No obvious polypoid lesions are identified.  Right ovary  Measurements: 2.7 x 1.4 x 2.3 cm. Normal appearance/no adnexal mass.  Other findings  No free fluid.  IMPRESSION: Small amount of fluid/ blood in the endometrial canal with homogeneously thickened endometrium. No focal masses are identified. The left ovary is not visualized by ultrasound.   Electronically Signed   By: Aletta Edouard M.D.   On: 03/06/2014 11:02   US Venous Img Lower Bilateral  03/02/2014   CLINICAL DATA:  Equivocal chest CTA  EXAM: Bilateral LOWER EXTREMITY VENOUS DOPPLER ULTRASOUND  TECHNIQUE: Gray-scale sonography with graded compression, as well as color Doppler and duplex ultrasound, were performed to evaluate the deep venous system from the level of the common femoral vein through the popliteal and proximal calf veins. Spectral Doppler was utilized to evaluate flow at rest and with distal augmentation maneuvers.  COMPARISON:  None.  FINDINGS: Thrombus within deep veins:  None visualized.  Compressibility of deep veins:  Normal.  Duplex waveform respiratory phasicity:  Normal.  Duplex waveform response to augmentation:  Normal.  Venous reflux:  None visualized.  Other findings:  None visualized.  IMPRESSION: No deep venous thrombosis is noted  bilaterally.   Electronically Signed   By: Inez Catalina M.D.   On: 03/02/2014 17:59   Dg Chest Portable 1 View  03/05/2014   CLINICAL DATA:  Shortness of breath, chest pain,  vaginal bleeding.  EXAM: PORTABLE CHEST - 1 VIEW  COMPARISON:  CT ANGIO CHEST W/CM &/OR WO/CM dated 03/02/2014; DG CHEST 2 VIEW dated 03/02/2014  FINDINGS: The cardiac silhouette is upper limits of nor size, mediastinal silhouette is nonsuspicious for this low inspiratory portable examination with crowded vasculature markings.The lungs are clear without pleural effusions or focal consolidations. Trachea projects midline and there is no pneumothorax. Included soft tissue planes and osseous structures are non-suspicious. Moderate degenerative change of thoracic spine.  IMPRESSION: Borderline cardiomegaly, no acute pulmonary process.   Electronically Signed   By: Elon Alas   On: 03/05/2014 22:52    Oren Binet, MD  [facility] Pager:[phone]  If 7PM-7AM, please contact night-coverage [email or web address] Password [email or web address] 03/07/2014, 11:22 AM   LOS: 2 days

## 2014-03-07 NOTE — Progress Notes (Signed)
1456: Attempted to call report to 2W RN.  1522: Report given to 2W RN. VSS. PIV SL. Need UA if possible. NSR w Murmur noted. eICU and CCMT Lanelle Bal) notified of transfer. Will continue to monitor until time of transfer. All belongings sent with patient. Pt transferred by wheelchair with NT escort on tele.

## 2014-03-07 NOTE — Progress Notes (Signed)
[facility] PCCM  S: Feels great, has been up walking around O: Filed Vitals:   03/07/14 0255 03/07/14 0450 03/07/14 0717 03/07/14 0824  BP: 100/70 104/51 112/47   Pulse: 84 79 79   Temp: 97.5 F (36.4 C) 97.7 F (36.5 C) 97.6 F (36.4 C)   TempSrc: Oral Oral Oral   Resp: 20 26 22    Height:      Weight:      SpO2: 100% 100% 96% 97%   Gen: morbidly obese, no acute distress HEENT: NCAT, OP clear PULM: CTA B CV: RRR with systolic murmur noted AB: BS+, soft, nontender Ext: warm trace edema  V/Q scan: negative  1) Dyspnea on exertion> multifactorial from obesity and HOCM; given normal pulmonary exam, and normal oxygenation I don't see evidence of lung disease.  She does not have a pulmonary embolism.  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  Plan: -d/c anticoagulation -HOCM management per cardiology -outpatient PFTs can be ordered by PCP, suspect they will show restriction from obesity -we are happy to see her as an outpatient if her PCP needs [place] to further evaluate  PCCM to sign off  Jillyn Hidden PCCM Pager: [phone] Cell: [phone] If no response, call [phone]

## 2014-03-07 NOTE — Progress Notes (Signed)
Patient: Erin Sexton / Admit Date: 03/05/2014 / Date of Encounter: 03/07/2014, 10:33 AM  Subjective  Feeling so much better. Denies dyspnea, CP. Vaginal bleeding has slowed.  Objective   Telemetry: NSR   Physical Exam: Blood pressure 112/47, pulse 79, temperature 97.6 F (36.4 C), temperature source Oral, resp. rate 22, height 5\' 9"  (1.753 m), weight 392 lb 10.2 oz (178.1 kg), last menstrual period 03/02/2014, SpO2 97.00%. General: Well developed morbidly obese WF in no acute distress. Head: Normocephalic, atraumatic, sclera non-icteric, no xanthomas, nares are without discharge. Neck: Negative for carotid bruits. JVP not elevated. Lungs: Clear bilaterally to auscultation without wheezes, rales, or rhonchi. Breathing is unlabored. Heart: RRR S1 S2 without rubs or gallops. +3/6 SEM RUSB. Abdomen: Soft, non-tender, non-distended with normoactive bowel sounds. No rebound/guarding. Extremities: No clubbing or cyanosis. Tr edema. Distal pedal pulses are 2+ and equal bilaterally. Neuro: Alert and oriented X 3. Moves all extremities spontaneously. Psych:  Responds to questions appropriately with a normal affect.   Intake/Output Summary (Last 24 hours) at 03/07/14 1033 Last data filed at 03/07/14 0255  Gross per 24 hour  Intake   1487 ml  Output      0 ml  Net   1487 ml    Inpatient Medications:  . buPROPion  150 mg Oral Daily  . Chlorhexidine Gluconate Cloth  6 each Topical Q0600  . fluticasone  2 puff Inhalation BID  . megestrol  80 mg Oral TID  . metoprolol tartrate  25 mg Oral QID  . mupirocin ointment  1 application Nasal BID  . pantoprazole  40 mg Oral Q1200  . sodium chloride  3 mL Intravenous Q12H  . sodium chloride  3 mL Intravenous Q12H   Infusions:    Labs:  Recent Labs  03/05/14 2259 03/07/14 0352  NA 138 139  K 3.9 4.3  CL 102 103  CO2 27 26  GLUCOSE 258* 120*  BUN 16 14  CREATININE 0.75 0.76  CALCIUM 8.6 8.7    Recent Labs  03/05/14 2259  03/07/14 0352  AST 16 15  ALT 20 15  ALKPHOS 87 63  BILITOT 0.2* 0.4  PROT 6.4 6.0  ALBUMIN 3.1* 2.9*    Recent Labs  03/04/14 1550 03/05/14 2259 03/06/14 0925 03/07/14 0352  WBC 9.8 11.0* 13.0* 15.2*  NEUTROABS 7.2 7.8*  --   --   HGB 9.9* 7.3* 8.3* 9.2*  HCT 31.6* 22.5* 25.3* 27.5*  MCV 81.7 80.9 80.6 81.4  PLT 411* 306 311 302    Recent Labs  03/06/14 0023 03/06/14 1633 03/07/14 0352 03/07/14 0841  TROPONINI <0.30 <0.30 <0.30 <0.30   No components found with this basename: POCBNP,  No results found for this basename: HGBA1C,  in the last 72 hours   Radiology/Studies:  Dg Chest 2 View  03/02/2014   CLINICAL DATA:  Short of breath for few days.  History of asthma.  EXAM: CHEST  2 VIEW  COMPARISON:  DG CHEST 2 VIEW dated 01/30/2007  FINDINGS: Lateral view degraded by patient arm position. Lateral view is also a minimally motion degraded. Moderate thoracic spondylosis. Midline trachea. Borderline cardiomegaly. Mediastinal contours otherwise within normal limits. No pleural effusion or pneumothorax. Clear lungs.  IMPRESSION: Borderline cardiomegaly, without acute disease.   Electronically Signed   By: Abigail Miyamoto M.D.   On: 03/02/2014 13:20   Ct Angio Chest Pe W/cm &/or Wo Cm  03/02/2014   CLINICAL DATA:  Shortness of breath.  EXAM: CT ANGIOGRAPHY CHEST WITH  CONTRAST  TECHNIQUE: Multidetector CT imaging of the chest was performed using the standard protocol during bolus administration of intravenous contrast. Multiplanar CT image reconstructions and MIPs were obtained to evaluate the vascular anatomy.  CONTRAST:  111mL OMNIPAQUE IOHEXOL 350 MG/ML SOLN  COMPARISON:  DG CHEST 2 VIEW dated 03/02/2014  FINDINGS: Thoracic aorta is unremarkable. 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 The this can occasionally result in dysphagia. This is a normal variant. Pulmonary arteries are difficult to evaluate due to patient size and phase of enhancement.  Tiny bilateral pulmonary emboli cannot be excluded. No major pulmonary artery emboli are present. Cardiomegaly. Coronary artery disease cannot be excluded. No pericardial effusion.  Shotty mediastinal lymph nodes. No pathologic adenopathy is CT size criteria. Thoracic esophagus is unremarkable. Adrenals unremarkable. Visualized upper abdominal viscera unremarkable.  Large airways patent. Mild pleural thickening noted consistent scarring. Mild basilar atelectasis.  Thyroid normal size. No significant axillary adenopathy. Chest wall is intact. Degenerative changes lumbar spine. No acute bony abnormality.  Review of the MIP images confirms the above findings.  IMPRESSION: 1. Limited study. Cannot exclude very tiny bilateral pulmonary emboli. 2. Aberrant right subclavian artery, this is a normal finding. This can result and mild esophageal compression and dysplasia. 3. Cardiomegaly. Coronary artery disease cannot be excluded. Critical Value/emergent results were called by telephone at the time of interpretation on 03/02/2014 at 4:40 PM to Dr. Mikeal Hawthorne , who verbally acknowledged these results.   Electronically Signed   By: Marcello Moores  Register   On: 03/02/2014 16:43   US Transvaginal Non-ob  03/06/2014   CLINICAL DATA:  Abnormal vaginal bleeding.  EXAM: TRANSABDOMINAL AND TRANSVAGINAL ULTRASOUND OF PELVIS  TECHNIQUE: Both transabdominal and transvaginal ultrasound examinations of the pelvis were performed. Transabdominal technique was performed for global imaging of the pelvis including uterus, ovaries, adnexal regions, and pelvic cul-de-sac. It was necessary to proceed with endovaginal exam following the transabdominal exam to visualize the uterus and ovaries.  COMPARISON:  None  FINDINGS: Uterus  Measurements: 10.0 x 5.9 x 5.7 cm. Mildly heterogeneous myometrium without evidence of focal mass.  Endometrium  Thickness: 15 mm. There is a small amount of fluid/ blood in the endometrial cavity. The endometrium is  homogeneously thickened which may be consistent with secretory endometrium. No obvious polypoid lesions are identified.  Right ovary  Measurements: 2.7 x 1.4 x 2.3 cm. Normal appearance/no adnexal mass.  Other findings  No free fluid.  IMPRESSION: Small amount of fluid/ blood in the endometrial canal with homogeneously thickened endometrium. No focal masses are identified. The left ovary is not visualized by ultrasound.   Electronically Signed   By: Aletta Edouard M.D.   On: 03/06/2014 11:02   US Pelvis Complete  03/06/2014   CLINICAL DATA:  Abnormal vaginal bleeding.  EXAM: TRANSABDOMINAL AND TRANSVAGINAL ULTRASOUND OF PELVIS  TECHNIQUE: Both transabdominal and transvaginal ultrasound examinations of the pelvis were performed. Transabdominal technique was performed for global imaging of the pelvis including uterus, ovaries, adnexal regions, and pelvic cul-de-sac. It was necessary to proceed with endovaginal exam following the transabdominal exam to visualize the uterus and ovaries.  COMPARISON:  None  FINDINGS: Uterus  Measurements: 10.0 x 5.9 x 5.7 cm. Mildly heterogeneous myometrium without evidence of focal mass.  Endometrium  Thickness: 15 mm. There is a small amount of fluid/ blood in the endometrial cavity. The endometrium is homogeneously thickened which may be consistent with secretory endometrium. No obvious polypoid lesions are identified.  Right ovary  Measurements:  2.7 x 1.4 x 2.3 cm. Normal appearance/no adnexal mass.  Other findings  No free fluid.  IMPRESSION: Small amount of fluid/ blood in the endometrial canal with homogeneously thickened endometrium. No focal masses are identified. The left ovary is not visualized by ultrasound.   Electronically Signed   By: Aletta Edouard M.D.   On: 03/06/2014 11:02   Nm Pulmonary Perf And Vent  03/06/2014   CLINICAL DATA:  Dyspnea  EXAM: NUCLEAR MEDICINE VENTILATION - PERFUSION LUNG SCAN  TECHNIQUE: Ventilation images were obtained in multiple  projections using inhaled aerosol technetium 99 M DTPA. Perfusion images were obtained in multiple projections after intravenous injection of Tc-22m MAA. Dual head camera technique could not be performed secondary to patient's weight exceeding the table limits. Three images were obtained in ventilation and 3 for perfusion.  RADIOPHARMACEUTICALS:  40 mCi Tc-55m DTPA aerosol and Chest x-ray an CTA chest 03/02/2014; chest x-ray 03/05/2014 6 mCi Tc-82m MAA  COMPARISON:  None.  FINDINGS: Ventilation: No focal ventilation defect.  Perfusion: No wedge shaped peripheral perfusion defects to suggest acute pulmonary embolism.  IMPRESSION: Negative.   Electronically Signed   By: Jacqulynn Cadet M.D.   On: 03/06/2014 18:43   US Venous Img Lower Bilateral  03/02/2014   CLINICAL DATA:  Equivocal chest CTA  EXAM: Bilateral LOWER EXTREMITY VENOUS DOPPLER ULTRASOUND  TECHNIQUE: Gray-scale sonography with graded compression, as well as color Doppler and duplex ultrasound, were performed to evaluate the deep venous system from the level of the common femoral vein through the popliteal and proximal calf veins. Spectral Doppler was utilized to evaluate flow at rest and with distal augmentation maneuvers.  COMPARISON:  None.  FINDINGS: Thrombus within deep veins:  None visualized.  Compressibility of deep veins:  Normal.  Duplex waveform respiratory phasicity:  Normal.  Duplex waveform response to augmentation:  Normal.  Venous reflux:  None visualized.  Other findings:  None visualized.  IMPRESSION: No deep venous thrombosis is noted bilaterally.   Electronically Signed   By: Inez Catalina M.D.   On: 03/02/2014 17:59   Dg Chest Portable 1 View  03/05/2014   CLINICAL DATA:  Shortness of breath, chest pain, vaginal bleeding.  EXAM: PORTABLE CHEST - 1 VIEW  COMPARISON:  CT ANGIO CHEST W/CM &/OR WO/CM dated 03/02/2014; DG CHEST 2 VIEW dated 03/02/2014  FINDINGS: The cardiac silhouette is upper limits of nor size, mediastinal silhouette  is nonsuspicious for this low inspiratory portable examination with crowded vasculature markings.The lungs are clear without pleural effusions or focal consolidations. Trachea projects midline and there is no pneumothorax. Included soft tissue planes and osseous structures are non-suspicious. Moderate degenerative change of thoracic spine.  IMPRESSION: Borderline cardiomegaly, no acute pulmonary process.   Electronically Signed   By: Elon Alas   On: 03/05/2014 22:52     Assessment and Plan  43 year old female with morbid obesity, chronic but progressive dyspnea, possible but not likely small pulmonary embolism 03/02/14 (neg VQ 03/06/14, neg LE duplex), with echocardiogram consistent with HOCM. Other comorbidities include vaginal bleeding and anemia s/p transfusion with Xarelto cessation recommended by OB/GYN.  1. HOCM/chest pain/SVT  2. Leukocytosis 3. Vaginal bleeding/anemia 4. Dyspnea  Plan:  - Continue beta blocker. No further SVT on tele. Beta blocker will help decrease heart rate, decrease outflow tract gradient and should also help with symptoms. Consider consolidation to 50mg  BID. - Regarding CP, EKG is nonspecific, troponin is normal several occasions. Per Dr. Kingsley Plan note, unfortunately, given body habitus there is no  specific stress test modality that would be useful other than possibly PET scan which is sometimes done at [facility]. Current plan is to avoid invasive testing, i.e. cardiac catheterization, unless objective evidence arises such as elevated troponin or dramatic EKG changes. For now, continue treatment with antianginal medication metoprolol. No further sx. - Will add a lipid panel in AM for risk stratification. - It would be wise to avoid excessive nitrates given her elevated outflow tract velocity as decreasing afterload may result any worsening gradient. - Note that PCCM plans for full PFTs to further eval dyspnea, but suspect this is at least in part due to acute  anemia.  Signed, Melina Copa PA-C Patient seen and examined. I agree with the assessment and plan as detailed above. See also my additional thoughts below.   I have reviewed all the data in the plans. I agree with the excellent note as outlined above.  Dola Argyle, MD, [facility] 03/07/2014 11:07 AM

## 2014-03-08 ENCOUNTER — Other Ambulatory Visit: Payer: Self-pay | Admitting: Physician Assistant

## 2014-03-08 ENCOUNTER — Encounter: Payer: Self-pay | Admitting: *Deleted

## 2014-03-08 DIAGNOSIS — I421 Obstructive hypertrophic cardiomyopathy: Secondary | ICD-10-CM

## 2014-03-08 DIAGNOSIS — I472 Ventricular tachycardia: Secondary | ICD-10-CM

## 2014-03-08 DIAGNOSIS — I4729 Other ventricular tachycardia: Secondary | ICD-10-CM

## 2014-03-08 LAB — BASIC METABOLIC PANEL
BUN: 13 mg/dL (ref 6–23)
CO2: 25 meq/L (ref 19–32)
CREATININE: 0.76 mg/dL (ref 0.50–1.10)
Calcium: 8.2 mg/dL — ABNORMAL LOW (ref 8.4–10.5)
Chloride: 103 mEq/L (ref 96–112)
Glucose, Bld: 180 mg/dL — ABNORMAL HIGH (ref 70–99)
Potassium: 3.9 mEq/L (ref 3.7–5.3)
SODIUM: 138 meq/L (ref 137–147)

## 2014-03-08 LAB — MAGNESIUM: Magnesium: 2.1 mg/dL (ref 1.5–2.5)

## 2014-03-08 LAB — CBC
HCT: 26.1 % — ABNORMAL LOW (ref 36.0–46.0)
Hemoglobin: 8.6 g/dL — ABNORMAL LOW (ref 12.0–15.0)
MCH: 27 pg (ref 26.0–34.0)
MCHC: 33 g/dL (ref 30.0–36.0)
MCV: 81.8 fL (ref 78.0–100.0)
PLATELETS: 292 10*3/uL (ref 150–400)
RBC: 3.19 MIL/uL — AB (ref 3.87–5.11)
RDW: 15.6 % — AB (ref 11.5–15.5)
WBC: 12.3 10*3/uL — ABNORMAL HIGH (ref 4.0–10.5)

## 2014-03-08 LAB — LIPID PANEL
CHOL/HDL RATIO: 4.1 ratio
CHOLESTEROL: 126 mg/dL (ref 0–200)
HDL: 31 mg/dL — AB (ref >39)
LDL Cholesterol: 68 mg/dL (ref 0–99)
Triglycerides: 134 mg/dL (ref <150)
VLDL: 27 mg/dL (ref 0–40)

## 2014-03-08 MED ORDER — METOPROLOL TARTRATE 50 MG PO TABS
50.0000 mg | ORAL_TABLET | Freq: Two times a day (BID) | ORAL | Status: DC
Start: 2014-03-08 — End: 2014-05-15

## 2014-03-08 MED ORDER — METOPROLOL TARTRATE 50 MG PO TABS
50.0000 mg | ORAL_TABLET | Freq: Two times a day (BID) | ORAL | Status: DC
  Administered 2014-03-08: 50 mg via ORAL
  Filled 2014-03-08 (×2): qty 1

## 2014-03-08 MED ORDER — FERROUS SULFATE 325 (65 FE) MG PO TABS: 325.0000 mg | ORAL_TABLET | Freq: Two times a day (BID) | ORAL | Status: DC

## 2014-03-08 MED ORDER — METFORMIN HCL 500 MG PO TABS: 500.0000 mg | ORAL_TABLET | Freq: Two times a day (BID) | ORAL | Status: DC

## 2014-03-08 MED ORDER — METOPROLOL TARTRATE 50 MG PO TABS: 50.0000 mg | ORAL_TABLET | Freq: Four times a day (QID) | ORAL | Status: DC

## 2014-03-08 MED ORDER — POTASSIUM CHLORIDE CRYS ER 20 MEQ PO TBCR
20.0000 meq | EXTENDED_RELEASE_TABLET | Freq: Once | ORAL | Status: AC
  Administered 2014-03-08: 20 meq via ORAL

## 2014-03-08 MED ORDER — MEGESTROL ACETATE 40 MG PO TABS: 80.0000 mg | ORAL_TABLET | Freq: Every day | ORAL | Status: DC

## 2014-03-08 MED ORDER — FERROUS SULFATE 325 (65 FE) MG PO TABS
325.0000 mg | ORAL_TABLET | Freq: Two times a day (BID) | ORAL | Status: DC
  Filled 2014-03-08 (×2): qty 1

## 2014-03-08 MED ORDER — METFORMIN HCL 500 MG PO TABS
500.0000 mg | ORAL_TABLET | Freq: Two times a day (BID) | ORAL | Status: DC
  Filled 2014-03-08 (×2): qty 1

## 2014-03-08 NOTE — Progress Notes (Signed)
Patient: Erin Sexton / Admit Date: 03/05/2014 / Date of Encounter: 03/08/2014, 9:45 AM  Subjective  Feels great. Anxious to shower. Asymptomatic with last night's brief NSVT. Denies any prior history of syncope. Has had palpitations in the past but no really sustained episodes.  Objective   Telemetry: NSR, 12 beats wide complex tachy at 3am, no apparent P waves, ? NSVT  Physical Exam: Blood pressure 118/59, pulse 68, temperature 97.6 F (36.4 C), temperature source Oral, resp. rate 18, height 5\' 9"  (1.753 m), weight 392 lb 10.2 oz (178.1 kg), last menstrual period 03/02/2014, SpO2 98.00%. General: Well developed morbidly obese WF in no acute distress.  Head: Normocephalic, atraumatic, sclera non-icteric, no xanthomas, nares are without discharge.  Neck: Negative for carotid bruits. JVP not elevated.  Lungs: Clear bilaterally to auscultation without wheezes, rales, or rhonchi. Breathing is unlabored.  Heart: RRR S1 S2 without rubs or gallops. +3/6 SEM midsternal.  Abdomen: Soft, non-tender, non-distended with normoactive bowel sounds. No rebound/guarding.  Extremities: No clubbing or cyanosis. Tr edema. Distal pedal pulses are 2+ and equal bilaterally.  Neuro: Alert and oriented X 3. Moves all extremities spontaneously.  Psych: Responds to questions appropriately with a normal affect.  Intake/Output Summary (Last 24 hours) at 03/08/14 0945 Last data filed at 03/07/14 1800  Gross per 24 hour  Intake    480 ml  Output      0 ml  Net    480 ml    Inpatient Medications:  . buPROPion  150 mg Oral Daily  . Chlorhexidine Gluconate Cloth  6 each Topical Q0600  . ferrous sulfate  325 mg Oral BID WC  . fluticasone  2 puff Inhalation BID  . megestrol  80 mg Oral Daily  . metFORMIN  500 mg Oral BID WC  . metoprolol tartrate  50 mg Oral BID  . mupirocin ointment  1 application Nasal BID  . pantoprazole  40 mg Oral Q1200  . sodium chloride  3 mL Intravenous Q12H  . sodium chloride  3  mL Intravenous Q12H   Infusions:    Labs:  Recent Labs  03/05/14 2259 03/07/14 0352  NA 138 139  K 3.9 4.3  CL 102 103  CO2 27 26  GLUCOSE 258* 120*  BUN 16 14  CREATININE 0.75 0.76  CALCIUM 8.6 8.7    Recent Labs  03/05/14 2259 03/07/14 0352  AST 16 15  ALT 20 15  ALKPHOS 87 63  BILITOT 0.2* 0.4  PROT 6.4 6.0  ALBUMIN 3.1* 2.9*    Recent Labs  03/05/14 2259  03/07/14 0352 03/08/14 0455  WBC 11.0*  < > 15.2* 12.3*  NEUTROABS 7.8*  --   --   --   HGB 7.3*  < > 9.2* 8.6*  HCT 22.5*  < > 27.5* 26.1*  MCV 80.9  < > 81.4 81.8  PLT 306  < > 302 292  < > = values in this interval not displayed.  Recent Labs  03/06/14 0023 03/06/14 1633 03/07/14 0352 03/07/14 0841  TROPONINI <0.30 <0.30 <0.30 <0.30   No components found with this basename: POCBNP,   Recent Labs  03/07/14 0352  HGBA1C 6.7*     Radiology/Studies:  Dg Chest 2 View  03/02/2014   CLINICAL DATA:  Short of breath for few days.  History of asthma.  EXAM: CHEST  2 VIEW  COMPARISON:  DG CHEST 2 VIEW dated 01/30/2007  FINDINGS: Lateral view degraded by patient arm position. Lateral view is also  a minimally motion degraded. Moderate thoracic spondylosis. Midline trachea. Borderline cardiomegaly. Mediastinal contours otherwise within normal limits. No pleural effusion or pneumothorax. Clear lungs.  IMPRESSION: Borderline cardiomegaly, without acute disease.   Electronically Signed   By: Abigail Miyamoto M.D.   On: 03/02/2014 13:20   Ct Angio Chest Pe W/cm &/or Wo Cm  03/02/2014   CLINICAL DATA:  Shortness of breath.  EXAM: CT ANGIOGRAPHY CHEST WITH CONTRAST  TECHNIQUE: Multidetector CT imaging of the chest was performed using the standard protocol during bolus administration of intravenous contrast. Multiplanar CT image reconstructions and MIPs were obtained to evaluate the vascular anatomy.  CONTRAST:  117mL OMNIPAQUE IOHEXOL 350 MG/ML SOLN  COMPARISON:  DG CHEST 2 VIEW dated 03/02/2014  FINDINGS: Thoracic  aorta is unremarkable. 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 The this can occasionally result in dysphagia. This is a normal variant. Pulmonary arteries are difficult to evaluate due to patient size and phase of enhancement. Tiny bilateral pulmonary emboli cannot be excluded. No major pulmonary artery emboli are present. Cardiomegaly. Coronary artery disease cannot be excluded. No pericardial effusion.  Shotty mediastinal lymph nodes. No pathologic adenopathy is CT size criteria. Thoracic esophagus is unremarkable. Adrenals unremarkable. Visualized upper abdominal viscera unremarkable.  Large airways patent. Mild pleural thickening noted consistent scarring. Mild basilar atelectasis.  Thyroid normal size. No significant axillary adenopathy. Chest wall is intact. Degenerative changes lumbar spine. No acute bony abnormality.  Review of the MIP images confirms the above findings.  IMPRESSION: 1. Limited study. Cannot exclude very tiny bilateral pulmonary emboli. 2. Aberrant right subclavian artery, this is a normal finding. This can result and mild esophageal compression and dysplasia. 3. Cardiomegaly. Coronary artery disease cannot be excluded. Critical Value/emergent results were called by telephone at the time of interpretation on 03/02/2014 at 4:40 PM to Dr. Mikeal Hawthorne , who verbally acknowledged these results.   Electronically Signed   By: Marcello Moores  Register   On: 03/02/2014 16:43   US Transvaginal Non-ob  03/06/2014   CLINICAL DATA:  Abnormal vaginal bleeding.  EXAM: TRANSABDOMINAL AND TRANSVAGINAL ULTRASOUND OF PELVIS  TECHNIQUE: Both transabdominal and transvaginal ultrasound examinations of the pelvis were performed. Transabdominal technique was performed for global imaging of the pelvis including uterus, ovaries, adnexal regions, and pelvic cul-de-sac. It was necessary to proceed with endovaginal exam following the transabdominal exam to visualize the uterus and ovaries.   COMPARISON:  None  FINDINGS: Uterus  Measurements: 10.0 x 5.9 x 5.7 cm. Mildly heterogeneous myometrium without evidence of focal mass.  Endometrium  Thickness: 15 mm. There is a small amount of fluid/ blood in the endometrial cavity. The endometrium is homogeneously thickened which may be consistent with secretory endometrium. No obvious polypoid lesions are identified.  Right ovary  Measurements: 2.7 x 1.4 x 2.3 cm. Normal appearance/no adnexal mass.  Other findings  No free fluid.  IMPRESSION: Small amount of fluid/ blood in the endometrial canal with homogeneously thickened endometrium. No focal masses are identified. The left ovary is not visualized by ultrasound.   Electronically Signed   By: Aletta Edouard M.D.   On: 03/06/2014 11:02   US Pelvis Complete  03/06/2014   CLINICAL DATA:  Abnormal vaginal bleeding.  EXAM: TRANSABDOMINAL AND TRANSVAGINAL ULTRASOUND OF PELVIS  TECHNIQUE: Both transabdominal and transvaginal ultrasound examinations of the pelvis were performed. Transabdominal technique was performed for global imaging of the pelvis including uterus, ovaries, adnexal regions, and pelvic cul-de-sac. It was necessary to proceed with endovaginal exam following  the transabdominal exam to visualize the uterus and ovaries.  COMPARISON:  None  FINDINGS: Uterus  Measurements: 10.0 x 5.9 x 5.7 cm. Mildly heterogeneous myometrium without evidence of focal mass.  Endometrium  Thickness: 15 mm. There is a small amount of fluid/ blood in the endometrial cavity. The endometrium is homogeneously thickened which may be consistent with secretory endometrium. No obvious polypoid lesions are identified.  Right ovary  Measurements: 2.7 x 1.4 x 2.3 cm. Normal appearance/no adnexal mass.  Other findings  No free fluid.  IMPRESSION: Small amount of fluid/ blood in the endometrial canal with homogeneously thickened endometrium. No focal masses are identified. The left ovary is not visualized by ultrasound.    Electronically Signed   By: Aletta Edouard M.D.   On: 03/06/2014 11:02   Nm Pulmonary Perf And Vent  03/06/2014   CLINICAL DATA:  Dyspnea  EXAM: NUCLEAR MEDICINE VENTILATION - PERFUSION LUNG SCAN  TECHNIQUE: Ventilation images were obtained in multiple projections using inhaled aerosol technetium 99 M DTPA. Perfusion images were obtained in multiple projections after intravenous injection of Tc-52m MAA. Dual head camera technique could not be performed secondary to patient's weight exceeding the table limits. Three images were obtained in ventilation and 3 for perfusion.  RADIOPHARMACEUTICALS:  40 mCi Tc-65m DTPA aerosol and Chest x-ray an CTA chest 03/02/2014; chest x-ray 03/05/2014 6 mCi Tc-71m MAA  COMPARISON:  None.  FINDINGS: Ventilation: No focal ventilation defect.  Perfusion: No wedge shaped peripheral perfusion defects to suggest acute pulmonary embolism.  IMPRESSION: Negative.   Electronically Signed   By: Jacqulynn Cadet M.D.   On: 03/06/2014 18:43   US Venous Img Lower Bilateral  03/02/2014   CLINICAL DATA:  Equivocal chest CTA  EXAM: Bilateral LOWER EXTREMITY VENOUS DOPPLER ULTRASOUND  TECHNIQUE: Gray-scale sonography with graded compression, as well as color Doppler and duplex ultrasound, were performed to evaluate the deep venous system from the level of the common femoral vein through the popliteal and proximal calf veins. Spectral Doppler was utilized to evaluate flow at rest and with distal augmentation maneuvers.  COMPARISON:  None.  FINDINGS: Thrombus within deep veins:  None visualized.  Compressibility of deep veins:  Normal.  Duplex waveform respiratory phasicity:  Normal.  Duplex waveform response to augmentation:  Normal.  Venous reflux:  None visualized.  Other findings:  None visualized.  IMPRESSION: No deep venous thrombosis is noted bilaterally.   Electronically Signed   By: Inez Catalina M.D.   On: 03/02/2014 17:59   Dg Chest Portable 1 View  03/05/2014   CLINICAL DATA:   Shortness of breath, chest pain, vaginal bleeding.  EXAM: PORTABLE CHEST - 1 VIEW  COMPARISON:  CT ANGIO CHEST W/CM &/OR WO/CM dated 03/02/2014; DG CHEST 2 VIEW dated 03/02/2014  FINDINGS: The cardiac silhouette is upper limits of nor size, mediastinal silhouette is nonsuspicious for this low inspiratory portable examination with crowded vasculature markings.The lungs are clear without pleural effusions or focal consolidations. Trachea projects midline and there is no pneumothorax. Included soft tissue planes and osseous structures are non-suspicious. Moderate degenerative change of thoracic spine.  IMPRESSION: Borderline cardiomegaly, no acute pulmonary process.   Electronically Signed   By: Elon Alas   On: 03/05/2014 22:52     Assessment and Plan  43 year old female with morbid obesity, chronic but progressive dyspnea, possible but not likely small pulmonary embolism 03/02/14 (neg VQ 03/06/14, neg LE duplex), with echocardiogram consistent with HOCM. Other comorbidities include vaginal bleeding and anemia s/p transfusion with  Xarelto cessation recommended by OB/GYN.   1. CP/SOB - likely due to symptomatic anemia. EKG is nonspecific, troponin is normal several occasions. Per Dr. Kingsley Plan note, unfortunately, given body habitus there is no specific stress test modality that would be useful other than possibly PET scan which is sometimes done at [facility]. Current plan is to avoid invasive testing, i.e. cardiac catheterization, unless objective evidence arises such as elevated troponin or dramatic EKG changes. For now, continue treatment with antianginal medication metoprolol. No further sx. Lipids OK. Note that PCCM plans for full PFTs to further eval dyspnea, but suspect this is at least in part due to acute anemia. 2. 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 BB as above. It would be wise to avoid excessive nitrates given her elevated outflow tract velocity as decreasing afterload may  result any worsening gradient.  3. Vaginal bleeding/anemia - per IM. Xarelto discontinued. 4. SVT, quiescent - continue BB. 5. NSVT - check lytes this AM. Would recommend K>/=4, Mg>/=2. Will plan event monitor at discharge. Warning signs reviewed with pt. She has not had syncope or nearsyncope before. Will need to f/u in clinic. 6. Newly recognized diabetes mellitus A1C 6.7 - per IM.  Await lytes before we clear for dc. Office will call her to arrange event monitor (21 days). F/u appt: 03/21/14 at 12:10pm with Richardson Dopp PA-C [facility] is in office that day)  Signed, Melina Copa PA-C Patient seen and examined. I agree with the assessment and plan as detailed above. See also my additional thoughts below.   The patient's cardiac status is stable. Electrolytes will be checked before she can go home. She did have wide complex beats that could possibly be ventricular tachycardia. She did not have symptoms. She has never had syncope. We will arrange for an event recorder to be placed. It is important to document her rhythm with her new diagnosis of HOCM.   Dola Argyle, MD, [facility] 03/08/2014 10:44 AM

## 2014-03-08 NOTE — Discharge Summary (Signed)
PATIENT DETAILS Name: Erin Sexton Age: 43 y.o. Sex: female Date of Birth: 1971/06/16 MRN: [id number]. Admit Date: 03/05/2014 Admitting Physician: Bonnielee Haff, MD PCP:No primary provider on file.  Recommendations for Outpatient Follow-up:  1. Repeat CBC at next visit with PCP 2. Optimize Diabetic Regimen  PRIMARY DISCHARGE DIAGNOSIS:  Principal Problem:   Vaginal bleeding Active Problems:   Morbid obesity   Dyspnea   Acute blood loss anemia   HOCM (hypertrophic obstructive cardiomyopathy)      PAST MEDICAL HISTORY: Past Medical History  Diagnosis Date  . COPD (chronic obstructive pulmonary disease)   . Depression   . Hypertension   . Asthma   . Anxiety   . Anemia   . Pulmonary emboli   . Blood transfusion without reported diagnosis     DISCHARGE MEDICATIONS:   Medication List    STOP taking these medications       hydrochlorothiazide 25 MG tablet  Commonly known as:  HYDRODIURIL     ibuprofen 200 MG tablet  Commonly known as:  ADVIL,MOTRIN     lisinopril 40 MG tablet  Commonly known as:  PRINIVIL,ZESTRIL      TAKE these medications       albuterol 108 (90 BASE) MCG/ACT inhaler  Commonly known as:  PROVENTIL HFA;VENTOLIN HFA  Inhale 2 puffs into the lungs every 6 (six) hours as needed for wheezing or shortness of breath.     albuterol (2.5 MG/3ML) 0.083% nebulizer solution  Commonly known as:  PROVENTIL  Take 3 mLs (2.5 mg total) by nebulization every 6 (six) hours as needed for wheezing or shortness of breath.     buPROPion 150 MG 24 hr tablet  Commonly known as:  WELLBUTRIN XL  Take 1 tablet (150 mg total) by mouth daily.     ferrous sulfate 325 (65 FE) MG tablet  Take 1 tablet (325 mg total) by mouth 2 (two) times daily with a meal.     fluticasone 110 MCG/ACT inhaler  Commonly known as:  FLOVENT HFA  Inhale 2 puffs into the lungs 2 (two) times daily.     megestrol 40 MG tablet  Commonly known as:  MEGACE  Take 2 tablets (80 mg  total) by mouth daily.     metFORMIN 500 MG tablet  Commonly known as:  GLUCOPHAGE  Take 1 tablet (500 mg total) by mouth 2 (two) times daily with a meal.     metoprolol 50 MG tablet  Commonly known as:  LOPRESSOR  Take 1 tablet (50 mg total) by mouth 2 (two) times daily.     pravastatin 20 MG tablet  Commonly known as:  PRAVACHOL  Take 1 tablet (20 mg total) by mouth daily.        ALLERGIES:  No Known Allergies  BRIEF HPI:  See H&P, Labs, Consult and Test reports for all details in brief, Erin Sexton is a 43 y.o. female who is morbidly obese, and who was recently hospitalized for her shortness of breath, and chest pain. She had a CT scan done for elevated d-dimer. She had the lower extremity Doppler done, which was negative for DVT. However, the physicians who took care of her during previous hospitalization felt that she could have had PE not detected by CT scan and placed her on anticoagulation. About 2 days prior this admission, she started having vaginal bleeding and was admitted for further evaluation and treatment.  CONSULTATIONS:   cardiology, pulmonary/intensive care and gynecology  PERTINENT RADIOLOGIC STUDIES: Dg Chest  2 View  03/02/2014   CLINICAL DATA:  Short of breath for few days.  History of asthma.  EXAM: CHEST  2 VIEW  COMPARISON:  DG CHEST 2 VIEW dated 01/30/2007  FINDINGS: Lateral view degraded by patient arm position. Lateral view is also a minimally motion degraded. Moderate thoracic spondylosis. Midline trachea. Borderline cardiomegaly. Mediastinal contours otherwise within normal limits. No pleural effusion or pneumothorax. Clear lungs.  IMPRESSION: Borderline cardiomegaly, without acute disease.   Electronically Signed   By: Abigail Miyamoto M.D.   On: 03/02/2014 13:20   Ct Angio Chest Pe W/cm &/or Wo Cm  03/02/2014   CLINICAL DATA:  Shortness of breath.  EXAM: CT ANGIOGRAPHY CHEST WITH CONTRAST  TECHNIQUE: Multidetector CT imaging of the chest was performed  using the standard protocol during bolus administration of intravenous contrast. Multiplanar CT image reconstructions and MIPs were obtained to evaluate the vascular anatomy.  CONTRAST:  159mL OMNIPAQUE IOHEXOL 350 MG/ML SOLN  COMPARISON:  DG CHEST 2 VIEW dated 03/02/2014  FINDINGS: Thoracic aorta is unremarkable. 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 The this can occasionally result in dysphagia. This is a normal variant. Pulmonary arteries are difficult to evaluate due to patient size and phase of enhancement. Tiny bilateral pulmonary emboli cannot be excluded. No major pulmonary artery emboli are present. Cardiomegaly. Coronary artery disease cannot be excluded. No pericardial effusion.  Shotty mediastinal lymph nodes. No pathologic adenopathy is CT size criteria. Thoracic esophagus is unremarkable. Adrenals unremarkable. Visualized upper abdominal viscera unremarkable.  Large airways patent. Mild pleural thickening noted consistent scarring. Mild basilar atelectasis.  Thyroid normal size. No significant axillary adenopathy. Chest wall is intact. Degenerative changes lumbar spine. No acute bony abnormality.  Review of the MIP images confirms the above findings.  IMPRESSION: 1. Limited study. Cannot exclude very tiny bilateral pulmonary emboli. 2. Aberrant right subclavian artery, this is a normal finding. This can result and mild esophageal compression and dysplasia. 3. Cardiomegaly. Coronary artery disease cannot be excluded. Critical Value/emergent results were called by telephone at the time of interpretation on 03/02/2014 at 4:40 PM to Dr. Mikeal Hawthorne , who verbally acknowledged these results.   Electronically Signed   By: Marcello Moores  Register   On: 03/02/2014 16:43   US Transvaginal Non-ob  03/06/2014   CLINICAL DATA:  Abnormal vaginal bleeding.  EXAM: TRANSABDOMINAL AND TRANSVAGINAL ULTRASOUND OF PELVIS  TECHNIQUE: Both transabdominal and transvaginal ultrasound examinations  of the pelvis were performed. Transabdominal technique was performed for global imaging of the pelvis including uterus, ovaries, adnexal regions, and pelvic cul-de-sac. It was necessary to proceed with endovaginal exam following the transabdominal exam to visualize the uterus and ovaries.  COMPARISON:  None  FINDINGS: Uterus  Measurements: 10.0 x 5.9 x 5.7 cm. Mildly heterogeneous myometrium without evidence of focal mass.  Endometrium  Thickness: 15 mm. There is a small amount of fluid/ blood in the endometrial cavity. The endometrium is homogeneously thickened which may be consistent with secretory endometrium. No obvious polypoid lesions are identified.  Right ovary  Measurements: 2.7 x 1.4 x 2.3 cm. Normal appearance/no adnexal mass.  Other findings  No free fluid.  IMPRESSION: Small amount of fluid/ blood in the endometrial canal with homogeneously thickened endometrium. No focal masses are identified. The left ovary is not visualized by ultrasound.   Electronically Signed   By: Aletta Edouard M.D.   On: 03/06/2014 11:02   US Pelvis Complete  03/06/2014   CLINICAL DATA:  Abnormal vaginal bleeding.  EXAM: TRANSABDOMINAL AND TRANSVAGINAL ULTRASOUND OF PELVIS  TECHNIQUE: Both transabdominal and transvaginal ultrasound examinations of the pelvis were performed. Transabdominal technique was performed for global imaging of the pelvis including uterus, ovaries, adnexal regions, and pelvic cul-de-sac. It was necessary to proceed with endovaginal exam following the transabdominal exam to visualize the uterus and ovaries.  COMPARISON:  None  FINDINGS: Uterus  Measurements: 10.0 x 5.9 x 5.7 cm. Mildly heterogeneous myometrium without evidence of focal mass.  Endometrium  Thickness: 15 mm. There is a small amount of fluid/ blood in the endometrial cavity. The endometrium is homogeneously thickened which may be consistent with secretory endometrium. No obvious polypoid lesions are identified.  Right ovary  Measurements:  2.7 x 1.4 x 2.3 cm. Normal appearance/no adnexal mass.  Other findings  No free fluid.  IMPRESSION: Small amount of fluid/ blood in the endometrial canal with homogeneously thickened endometrium. No focal masses are identified. The left ovary is not visualized by ultrasound.   Electronically Signed   By: Aletta Edouard M.D.   On: 03/06/2014 11:02   Nm Pulmonary Perf And Vent  03/06/2014   CLINICAL DATA:  Dyspnea  EXAM: NUCLEAR MEDICINE VENTILATION - PERFUSION LUNG SCAN  TECHNIQUE: Ventilation images were obtained in multiple projections using inhaled aerosol technetium 99 M DTPA. Perfusion images were obtained in multiple projections after intravenous injection of Tc-6m MAA. Dual head camera technique could not be performed secondary to patient's weight exceeding the table limits. Three images were obtained in ventilation and 3 for perfusion.  RADIOPHARMACEUTICALS:  40 mCi Tc-68m DTPA aerosol and Chest x-ray an CTA chest 03/02/2014; chest x-ray 03/05/2014 6 mCi Tc-71m MAA  COMPARISON:  None.  FINDINGS: Ventilation: No focal ventilation defect.  Perfusion: No wedge shaped peripheral perfusion defects to suggest acute pulmonary embolism.  IMPRESSION: Negative.   Electronically Signed   By: Jacqulynn Cadet M.D.   On: 03/06/2014 18:43   US Venous Img Lower Bilateral  03/02/2014   CLINICAL DATA:  Equivocal chest CTA  EXAM: Bilateral LOWER EXTREMITY VENOUS DOPPLER ULTRASOUND  TECHNIQUE: Gray-scale sonography with graded compression, as well as color Doppler and duplex ultrasound, were performed to evaluate the deep venous system from the level of the common femoral vein through the popliteal and proximal calf veins. Spectral Doppler was utilized to evaluate flow at rest and with distal augmentation maneuvers.  COMPARISON:  None.  FINDINGS: Thrombus within deep veins:  None visualized.  Compressibility of deep veins:  Normal.  Duplex waveform respiratory phasicity:  Normal.  Duplex waveform response to  augmentation:  Normal.  Venous reflux:  None visualized.  Other findings:  None visualized.  IMPRESSION: No deep venous thrombosis is noted bilaterally.   Electronically Signed   By: Inez Catalina M.D.   On: 03/02/2014 17:59   Dg Chest Portable 1 View  03/05/2014   CLINICAL DATA:  Shortness of breath, chest pain, vaginal bleeding.  EXAM: PORTABLE CHEST - 1 VIEW  COMPARISON:  CT ANGIO CHEST W/CM &/OR WO/CM dated 03/02/2014; DG CHEST 2 VIEW dated 03/02/2014  FINDINGS: The cardiac silhouette is upper limits of nor size, mediastinal silhouette is nonsuspicious for this low inspiratory portable examination with crowded vasculature markings.The lungs are clear without pleural effusions or focal consolidations. Trachea projects midline and there is no pneumothorax. Included soft tissue planes and osseous structures are non-suspicious. Moderate degenerative change of thoracic spine.  IMPRESSION: Borderline cardiomegaly, no acute pulmonary process.   Electronically Signed   By: Elon Alas   On: 03/05/2014  22:52     PERTINENT LAB RESULTS: CBC:  Recent Labs  03/07/14 0352 03/08/14 0455  WBC 15.2* 12.3*  HGB 9.2* 8.6*  HCT 27.5* 26.1*  PLT 302 292   CMET CMP     Component Value Date/Time   NA 138 03/08/2014 1015   K 3.9 03/08/2014 1015   CL 103 03/08/2014 1015   CO2 25 03/08/2014 1015   GLUCOSE 180* 03/08/2014 1015   BUN 13 03/08/2014 1015   CREATININE 0.76 03/08/2014 1015   CALCIUM 8.2* 03/08/2014 1015   PROT 6.0 03/07/2014 0352   ALBUMIN 2.9* 03/07/2014 0352   AST 15 03/07/2014 0352   ALT 15 03/07/2014 0352   ALKPHOS 63 03/07/2014 0352   BILITOT 0.4 03/07/2014 0352   GFRNONAA >90 03/08/2014 1015   GFRAA >90 03/08/2014 1015    GFR Estimated Creatinine Clearance: 155.6 ml/min (by C-G formula based on Cr of 0.76). No results found for this basename: LIPASE, AMYLASE,  in the last 72 hours  Recent Labs  03/06/14 1633 03/07/14 0352 03/07/14 0841  TROPONINI <0.30 <0.30 <0.30   No components  found with this basename: POCBNP,  No results found for this basename: DDIMER,  in the last 72 hours  Recent Labs  03/07/14 0352  HGBA1C 6.7*    Recent Labs  03/08/14 0455  CHOL 126  HDL 31*  LDLCALC 68  TRIG 134  CHOLHDL 4.1   No results found for this basename: TSH, T4TOTAL, FREET3, T3FREE, THYROIDAB,  in the last 72 hours  Recent Labs  03/06/14 0023  VITAMINB12 959*  FOLATE 13.5  FERRITIN 8*  TIBC 317  IRON 25*  RETICCTPCT 3.6*   Coags: No results found for this basename: PT, INR,  in the last 72 hours Microbiology: Recent Results (from the past 240 hour(s))  MRSA PCR SCREENING     Status: Abnormal   Collection Time    03/06/14  5:40 AM      Result Value Ref Range Status   MRSA by PCR POSITIVE (*) NEGATIVE Final   Comment:            The GeneXpert MRSA Assay (FDA     approved for NASAL specimens     only), is one component of a     comprehensive MRSA colonization     surveillance program. It is not     intended to diagnose MRSA     infection nor to guide or     monitor treatment for     MRSA infections.     RESULT CALLED TO, READ BACK BY AND VERIFIED WITH:     IRBY,T RN [phone] 03/06/14 [place] COURSE:  Vaginal bleeding  -Etiology not certain,Pelvic/Transvaginal ultrasound-showes homogeneously thickened endometrium. Patient was admitted and started on Megace 80 mg 3 times a day, continue to have significant vaginal bleeding, was seen officially by GYN on 03/06/14, given IV Premarin. With these measures, patient has significantly improved, hardly any further vaginal bleeding. I have spoken with GYN M.D. on call over the phone, Dr. Hulan Fray, she recommended decreasing Megace to 80 mg a daily. Patient has very minimal vaginal spotting now, the outpatient appointment for endometrial biopsy will be arranged by GYN.   Acute Blood Loss anemia  -secondary to above  -transfused a total of 4 units of PRBC on 3/29, hemoglobin stable at 8.6  - It appears  that the patient has chronic anemia, on initial presentation to the hospital on 3/25 her hemoglobin was  9.7. She will need a further workup to determine the cause of this anemia to be done in the outpatient setting. I have explained this to the patient, she is agreeable. However will start on iron supplementation on discharge.   Suspected Pul Embolism  -diagnoses uncertain-presented to [facility] on 3/25 with SOB-CT Ango-poor qualit due to weight showed a possible small PE and was discharged home with Xarelto. Continued to have SOB unchanged at home, then started to have significant vaginal bleeding as well.  -Given significant acute blood loss anemia, Xarelto was placed on hold on admission, since the diagnosis of pulmonary embolism was not certain, she underwent further workup during this hospitalization which included echocardiogram and a VQ scan. Pulmonology was also consulted. VQ scan was of low probability. Recent lower extremity Dopplers were negative. At this time, it is felt that she does not have pulmonary embolism. At this time, PCCM recommends no further workup and no anticoagulation at this time. Retrospectively, chest pain and shortness of breath probably was secondary to anemia, Obesity and underlying HOCM   HOCM  - 2-D echocardiogram done on 3/29 (HOCM. Cardiology was consulted, started on beta blocker with good results.   SVT  - Doing well with beta blockers. No further SVT on telemetry.   Chest pain  - Suspect secondary to anemia. Cardiac enzymes negative. Per cardiology, current plan is to avoid invasive testing. Continue with beta blocker. Given history of HOCM, would be very cautious with nitrates.   Dyspnea  -mostly on exertion. Significantly improved after transfusion of 4 units of PRBC.  - Suspect, also these issues are chronic-likely related to obesity, asthma, worsened by recent anemia and acute blood loss. HOCM also likely contributing significantly. Will need sleep study, and  PFTs to be done in the outpatient setting.   HTN  -BP controlled-with Metoprolol.   Morbid obesity:  weight loss counseling.   DM  - A1C 6.7-will start Metformin on discharge    TODAY-DAY OF DISCHARGE:  Subjective:   Zamyiah Tino today has no headache,no chest abdominal pain,no new weakness tingling or numbness, feels much better wants to go home today.   Objective:   Blood pressure 118/59, pulse 68, temperature 97.6 F (36.4 C), temperature source Oral, resp. rate 18, height 5\' 9"  (1.753 m), weight 178.1 kg (392 lb 10.2 oz), last menstrual period 03/02/2014, SpO2 98.00%.  Intake/Output Summary (Last 24 hours) at 03/08/14 1243 Last data filed at 03/08/14 0800  Gross per 24 hour  Intake    720 ml  Output      0 ml  Net    720 ml   Filed Weights   03/05/14 2210 03/06/14 0519  Weight: 179.171 kg (395 lb) 178.1 kg (392 lb 10.2 oz)    Exam Awake Alert, Oriented *3, No new F.N deficits, Normal affect .AT,PERRAL Supple Neck,No JVD, No cervical lymphadenopathy appriciated.  Symmetrical Chest wall movement, Good air movement bilaterally, CTAB RRR,No Gallops,Rubs or new Murmurs, No Parasternal Heave +ve B.Sounds, Abd Soft, Non tender, No organomegaly appriciated, No rebound -guarding or rigidity. No Cyanosis, Clubbing or edema, No new Rash or bruise  DISCHARGE CONDITION: Stable  DISPOSITION: Home  DISCHARGE INSTRUCTIONS:    Activity:  As tolerated Diet recommendation: Diabetic Diet Heart Healthy diet       Discharge Orders   Future Appointments Provider Department Dept Phone   03/16/2014 3:00 PM Donnamae Jude, [facility] [phone]   03/21/2014 12:10 PM Liliane Shi, PA-C [place]  Office [phone]   Future Orders Complete By Expires   Call MD for:  As directed    Scheduling Instructions:     If severe vaginal bleeding   Diet - low sodium heart healthy  As directed    Increase activity slowly  As directed        Follow-up Information   Follow up with [facility]. (Office will call you for a time to pick up your heart monitor.)    Specialty:  Cardiology   Contact information:   [address] [place] [address] [phone]      Follow up with Richardson Dopp, PA-C. ([facility] - 03/21/14 at 12:10pm)    Specialty:  Physician Assistant   Contact information:   [address] [place] [address] [phone]       Follow up with Donnamae Jude, MD On 03/16/2014. (appt at 3 pm)    Specialty:  Obstetrics and Gynecology   Contact information:   [place] [address] [phone]       Follow up with Doree Albee, MD. Schedule an appointment as soon as possible for a visit in 1 week.   Specialty:  Internal Medicine   Contact information:   [place] [address] [phone]      Total Time spent on discharge equals 45 minutes.  SignedOren Binet 03/08/2014 12:43 PM

## 2014-03-08 NOTE — Progress Notes (Signed)
PATIENT DETAILS Name: Erin Sexton Age: 43 y.o. Sex: female Date of Birth: 03/01/1971 Admit Date: 03/05/2014 Admitting Physician Bonnielee Haff, MD PCP:No primary provider on file.  Subjective: Vaginal bleeding has almost resolved.Wants to go home  Assessment/Plan: Principal Problem:   Vaginal bleeding -Etiology not certain,Pelvic/Transvaginal ultrasound-showes homogeneously thickened endometrium. Patient was admitted and started on Megace 80 mg 3 times a day, continue to have significant vaginal bleeding, was seen officially by GYN on 03/06/14, given IV Premarin. With these measures, patient has significantly improved, hardly any further vaginal bleeding. I have spoken with GYN M.D. on call over the phone, Dr. Hulan Fray, she recommended decreasing Megace to 80 mg a daily. Patient has very minimal vaginal spotting now, the outpatient appointment for endometrial biopsy will be arranged by GYN. I suspect she could be discharged home later today.  Active Problems: Acute Blood Loss anemia -secondary to above -transfused a total of 4 units of PRBC on 3/29, hemoglobin stable at 8.6 - It appears that the patient has chronic anemia, on initial presentation to the hospital on 3/25 her hemoglobin was 9.7. She will need a further workup to determine the cause of this anemia to be done in the outpatient setting. I have explained this to the patient, she is agreeable. However will start on iron supplementation on discharge.  Suspected Pul Embolism -diagnoses uncertain-presented to [facility] on 3/25 with SOB-CT Ango-poor qualit due to weight showed a possible small PE and was discharged home with Xarelto. Continued to have SOB unchanged at home, then started to have significant vaginal bleeding as well.  -Given significant acute blood loss anemia, Xarelto was placed on hold on admission, since the diagnosis of pulmonary embolism was not certain, she underwent further workup during this hospitalization which  included echocardiogram and a VQ scan. Pulmonology was also consulted. VQ scan was of low probability. Recent lower extremity Dopplers were negative. At this time, it is felt that she does not have pulmonary embolism. At this time, PCCM recommends no further workup and no anticoagulation at this time. Retrospectively, chest pain and shortness of breath probably was secondary to anemia, Obesity and underlying HOCM  HOCM - 2-D echocardiogram done on 3/29 (HOCM. Cardiology was consulted, started on beta blocker with good results.  SVT - Doing well with beta blockers. No further SVT on telemetry.  Chest pain - Suspect secondary to anemia. Cardiac enzymes negative. Per cardiology, current plan is to avoid invasive testing. Continue with beta blocker. Given history of HOCM, would be very cautious with nitrates.  Dyspnea -mostly on exertion. Significantly improved after transfusion of 4 units of PRBC. - Suspect, also these issues are chronic-likely related to obesity, asthma, worsened by recent anemia and acute blood loss. HOCM also likely contributing significantly. Will need sleep study, and PFTs to be done in the outpatient setting.  HTN -BP controlled-with Metoprolol.  Morbid obesity: weight loss counseling.  DM - A1C  6.7-will start Metformin on discharge  Disposition: Remain inpatient-home today-if no further recommendation by cardiology  DVT Prophylaxis:  SCD's   Code Status: Full code  Family Communication Mother at bedside 3/29  Procedures:  None  CONSULTS:  pulmonary/intensive care, Cardiology and GYN  MEDICATIONS: Scheduled Meds: . buPROPion  150 mg Oral Daily  . Chlorhexidine Gluconate Cloth  6 each Topical Q0600  . fluticasone  2 puff Inhalation BID  . megestrol  80 mg Oral Daily  . metoprolol tartrate  25 mg Oral QID  . mupirocin  ointment  1 application Nasal BID  . pantoprazole  40 mg Oral Q1200  . sodium chloride  3 mL Intravenous Q12H  . sodium chloride   3 mL Intravenous Q12H   Continuous Infusions:  PRN Meds:.sodium chloride, acetaminophen, acetaminophen, albuterol, conjugated estrogens, gi cocktail, morphine injection, ondansetron (ZOFRAN) IV, ondansetron, sodium chloride  Antibiotics: Anti-infectives   None       PHYSICAL EXAM: Vital signs in last 24 hours: Filed Vitals:   03/07/14 1507 03/07/14 1712 03/07/14 2130 03/08/14 0547  BP: 114/44 112/65 137/76 118/59  Pulse:  71 76 68  Temp:  98.2 F (36.8 C) 99.8 F (37.7 C) 97.6 F (36.4 C)  TempSrc:  Oral Oral Oral  Resp: 22 18 19 18   Height:      Weight:      SpO2:  99% 98% 98%    Weight change:  Filed Weights   03/05/14 2210 03/06/14 0519  Weight: 179.171 kg (395 lb) 178.1 kg (392 lb 10.2 oz)   Body mass index is 57.96 kg/(m^2).   Gen Exam: Awake and alert with clear speech.   Neck: Supple, No JVD.   Chest: B/L Clear.   CVS: S1 S2 Regular, systolic murmur.  Abdomen: soft, BS +, non tender, non distended.  Extremities: no edema, lower extremities warm to touch. Neurologic: Non Focal.   Skin: No Rash.   Wounds: N/A.   Intake/Output from previous day:  Intake/Output Summary (Last 24 hours) at 03/08/14 0907 Last data filed at 03/07/14 1800  Gross per 24 hour  Intake    480 ml  Output      0 ml  Net    480 ml     LAB RESULTS: CBC  Recent Labs Lab 03/02/14 1333  03/04/14 1550 03/05/14 2259 03/06/14 0925 03/07/14 0352 03/08/14 0455  WBC 9.7  < > 9.8 11.0* 13.0* 15.2* 12.3*  HGB 9.7*  < > 9.9* 7.3* 8.3* 9.2* 8.6*  HCT 30.8*  < > 31.6* 22.5* 25.3* 27.5* 26.1*  PLT 277  < > 411* 306 311 302 292  MCV 81.3  < > 81.7 80.9 80.6 81.4 81.8  MCH 25.6*  < > 25.6* 26.3 26.4 27.2 27.0  MCHC 31.5  < > 31.3 32.4 32.8 33.5 33.0  RDW 14.2  < > 14.5 14.5 15.2 15.4 15.6*  LYMPHSABS 1.8  --  1.9 2.4  --   --   --   MONOABS 0.5  --  0.4 0.6  --   --   --   EOSABS 0.4  --  0.4 0.3  --   --   --   BASOSABS 0.0  --  0.0 0.0  --   --   --   < > = values in this  interval not displayed.  Chemistries   Recent Labs Lab 03/02/14 1333 03/04/14 1550 03/05/14 2259 03/07/14 0352  NA 143 143 138 139  K 4.3 4.0 3.9 4.3  CL 107 102 102 103  CO2 28 30 27 26   GLUCOSE 113* 149* 258* 120*  BUN 15 17 16 14   CREATININE 0.82 0.83 0.75 0.76  CALCIUM 8.6 8.9 8.6 8.7    CBG: No results found for this basename: GLUCAP,  in the last 168 hours  GFR Estimated Creatinine Clearance: 155.6 ml/min (by C-G formula based on Cr of 0.76).  Coagulation profile  Recent Labs Lab 03/02/14 1500 03/04/14 1550  INR 1.11 1.66*    Cardiac Enzymes  Recent Labs Lab 03/06/14 1633 03/07/14  [id number] 03/07/14 0841  TROPONINI <0.30 <0.30 <0.30    No components found with this basename: POCBNP,  No results found for this basename: DDIMER,  in the last 72 hours  Recent Labs  03/07/14 0352  HGBA1C 6.7*    Recent Labs  03/08/14 0455  CHOL 126  HDL 31*  LDLCALC 68  TRIG 134  CHOLHDL 4.1   No results found for this basename: TSH, T4TOTAL, FREET3, T3FREE, THYROIDAB,  in the last 72 hours  Recent Labs  03/06/14 0023  VITAMINB12 959*  FOLATE 13.5  FERRITIN 8*  TIBC 317  IRON 25*  RETICCTPCT 3.6*   No results found for this basename: LIPASE, AMYLASE,  in the last 72 hours  Urine Studies No results found for this basename: UACOL, UAPR, USPG, UPH, UTP, UGL, UKET, UBIL, UHGB, UNIT, UROB, ULEU, UEPI, UWBC, URBC, UBAC, CAST, CRYS, UCOM, BILUA,  in the last 72 hours  MICROBIOLOGY: Recent Results (from the past 240 hour(s))  MRSA PCR SCREENING     Status: Abnormal   Collection Time    03/06/14  5:40 AM      Result Value Ref Range Status   MRSA by PCR POSITIVE (*) NEGATIVE Final   Comment:            The GeneXpert MRSA Assay (FDA     approved for NASAL specimens     only), is one component of a     comprehensive MRSA colonization     surveillance program. It is not     intended to diagnose MRSA     infection nor to guide or     monitor treatment for       MRSA infections.     RESULT CALLED TO, READ BACK BY AND VERIFIED WITH:     IRBY,T RN [phone] 03/06/14 MITCHELL,L    RADIOLOGY STUDIES/RESULTS: Dg Chest 2 View  03/02/2014   CLINICAL DATA:  Short of breath for few days.  History of asthma.  EXAM: CHEST  2 VIEW  COMPARISON:  DG CHEST 2 VIEW dated 01/30/2007  FINDINGS: Lateral view degraded by patient arm position. Lateral view is also a minimally motion degraded. Moderate thoracic spondylosis. Midline trachea. Borderline cardiomegaly. Mediastinal contours otherwise within normal limits. No pleural effusion or pneumothorax. Clear lungs.  IMPRESSION: Borderline cardiomegaly, without acute disease.   Electronically Signed   By: Abigail Miyamoto M.D.   On: 03/02/2014 13:20   Ct Angio Chest Pe W/cm &/or Wo Cm  03/02/2014   CLINICAL DATA:  Shortness of breath.  EXAM: CT ANGIOGRAPHY CHEST WITH CONTRAST  TECHNIQUE: Multidetector CT imaging of the chest was performed using the standard protocol during bolus administration of intravenous contrast. Multiplanar CT image reconstructions and MIPs were obtained to evaluate the vascular anatomy.  CONTRAST:  153mL OMNIPAQUE IOHEXOL 350 MG/ML SOLN  COMPARISON:  DG CHEST 2 VIEW dated 03/02/2014  FINDINGS: Thoracic aorta is unremarkable. 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 The this can occasionally result in dysphagia. This is a normal variant. Pulmonary arteries are difficult to evaluate due to patient size and phase of enhancement. Tiny bilateral pulmonary emboli cannot be excluded. No major pulmonary artery emboli are present. Cardiomegaly. Coronary artery disease cannot be excluded. No pericardial effusion.  Shotty mediastinal lymph nodes. No pathologic adenopathy is CT size criteria. Thoracic esophagus is unremarkable. Adrenals unremarkable. Visualized upper abdominal viscera unremarkable.  Large airways patent. Mild pleural thickening noted consistent scarring. Mild basilar  atelectasis.  Thyroid normal  size. No significant axillary adenopathy. Chest wall is intact. Degenerative changes lumbar spine. No acute bony abnormality.  Review of the MIP images confirms the above findings.  IMPRESSION: 1. Limited study. Cannot exclude very tiny bilateral pulmonary emboli. 2. Aberrant right subclavian artery, this is a normal finding. This can result and mild esophageal compression and dysplasia. 3. Cardiomegaly. Coronary artery disease cannot be excluded. Critical Value/emergent results were called by telephone at the time of interpretation on 03/02/2014 at 4:40 PM to Dr. Mikeal Hawthorne , who verbally acknowledged these results.   Electronically Signed   By: Marcello Moores  Register   On: 03/02/2014 16:43   US Transvaginal Non-ob  03/06/2014   CLINICAL DATA:  Abnormal vaginal bleeding.  EXAM: TRANSABDOMINAL AND TRANSVAGINAL ULTRASOUND OF PELVIS  TECHNIQUE: Both transabdominal and transvaginal ultrasound examinations of the pelvis were performed. Transabdominal technique was performed for global imaging of the pelvis including uterus, ovaries, adnexal regions, and pelvic cul-de-sac. It was necessary to proceed with endovaginal exam following the transabdominal exam to visualize the uterus and ovaries.  COMPARISON:  None  FINDINGS: Uterus  Measurements: 10.0 x 5.9 x 5.7 cm. Mildly heterogeneous myometrium without evidence of focal mass.  Endometrium  Thickness: 15 mm. There is a small amount of fluid/ blood in the endometrial cavity. The endometrium is homogeneously thickened which may be consistent with secretory endometrium. No obvious polypoid lesions are identified.  Right ovary  Measurements: 2.7 x 1.4 x 2.3 cm. Normal appearance/no adnexal mass.  Other findings  No free fluid.  IMPRESSION: Small amount of fluid/ blood in the endometrial canal with homogeneously thickened endometrium. No focal masses are identified. The left ovary is not visualized by ultrasound.   Electronically Signed   By: Aletta Edouard M.D.   On: 03/06/2014 11:02   US Pelvis Complete  03/06/2014   CLINICAL DATA:  Abnormal vaginal bleeding.  EXAM: TRANSABDOMINAL AND TRANSVAGINAL ULTRASOUND OF PELVIS  TECHNIQUE: Both transabdominal and transvaginal ultrasound examinations of the pelvis were performed. Transabdominal technique was performed for global imaging of the pelvis including uterus, ovaries, adnexal regions, and pelvic cul-de-sac. It was necessary to proceed with endovaginal exam following the transabdominal exam to visualize the uterus and ovaries.  COMPARISON:  None  FINDINGS: Uterus  Measurements: 10.0 x 5.9 x 5.7 cm. Mildly heterogeneous myometrium without evidence of focal mass.  Endometrium  Thickness: 15 mm. There is a small amount of fluid/ blood in the endometrial cavity. The endometrium is homogeneously thickened which may be consistent with secretory endometrium. No obvious polypoid lesions are identified.  Right ovary  Measurements: 2.7 x 1.4 x 2.3 cm. Normal appearance/no adnexal mass.  Other findings  No free fluid.  IMPRESSION: Small amount of fluid/ blood in the endometrial canal with homogeneously thickened endometrium. No focal masses are identified. The left ovary is not visualized by ultrasound.   Electronically Signed   By: Aletta Edouard M.D.   On: 03/06/2014 11:02   US Venous Img Lower Bilateral  03/02/2014   CLINICAL DATA:  Equivocal chest CTA  EXAM: Bilateral LOWER EXTREMITY VENOUS DOPPLER ULTRASOUND  TECHNIQUE: Gray-scale sonography with graded compression, as well as color Doppler and duplex ultrasound, were performed to evaluate the deep venous system from the level of the common femoral vein through the popliteal and proximal calf veins. Spectral Doppler was utilized to evaluate flow at rest and with distal augmentation maneuvers.  COMPARISON:  None.  FINDINGS: Thrombus within deep veins:  None visualized.  Compressibility of deep veins:  Normal.  Duplex waveform respiratory phasicity:  Normal.   Duplex waveform response to augmentation:  Normal.  Venous reflux:  None visualized.  Other findings:  None visualized.  IMPRESSION: No deep venous thrombosis is noted bilaterally.   Electronically Signed   By: Inez Catalina M.D.   On: 03/02/2014 17:59   Dg Chest Portable 1 View  03/05/2014   CLINICAL DATA:  Shortness of breath, chest pain, vaginal bleeding.  EXAM: PORTABLE CHEST - 1 VIEW  COMPARISON:  CT ANGIO CHEST W/CM &/OR WO/CM dated 03/02/2014; DG CHEST 2 VIEW dated 03/02/2014  FINDINGS: The cardiac silhouette is upper limits of nor size, mediastinal silhouette is nonsuspicious for this low inspiratory portable examination with crowded vasculature markings.The lungs are clear without pleural effusions or focal consolidations. Trachea projects midline and there is no pneumothorax. Included soft tissue planes and osseous structures are non-suspicious. Moderate degenerative change of thoracic spine.  IMPRESSION: Borderline cardiomegaly, no acute pulmonary process.   Electronically Signed   By: Elon Alas   On: 03/05/2014 22:52    Oren Binet, MD  [facility] Pager:[phone]  If 7PM-7AM, please contact night-coverage [email or web address] Password [email or web address] 03/08/2014, 9:07 AM   LOS: 3 days

## 2014-03-08 NOTE — Progress Notes (Signed)
Patient discharged to home.  Patient alert, oriented, verbally responsive, breathing regular and non-labored throughout, no s/s of distress noted throughout, no c/o pain throughout.  Discharge instructions thoroughly verbalized to patient.  Patient verbalized understanding throughout.  Prescriptions administered to patient.  Patient left unit per wheelchair accompanied by RN Cloretta Ned and CNA Remo Lipps.  VS WNL.  Dirk Dress 03/08/2014 2:10 PM

## 2014-03-09 ENCOUNTER — Encounter: Payer: Self-pay | Admitting: *Deleted

## 2014-03-09 ENCOUNTER — Telehealth: Payer: Self-pay | Admitting: *Deleted

## 2014-03-09 NOTE — Telephone Encounter (Signed)
[place] office. Spoke with Shelly Well. Shelly advised  will put in order for cardiac event monitor and this will be sent to pt's house. Called pt and made her aware, she would be getting a call from [place] office.

## 2014-03-09 NOTE — Telephone Encounter (Signed)
PT LIVES IN [place] BUT IS SCHEDULED FOR EVENT MONITOR PLACEMENT IN [place]. CAN WE HAVE IT MAILED TO HER INSTEAD OF HER HAVING TO GO TO ?

## 2014-03-09 NOTE — Progress Notes (Signed)
Patient ID: Erin Sexton, female   DOB: [birth year], 43 y.o.   MRN: [id number] Patient enrolled for [employer] to mail a 21 day cardiac event monitor to her home.

## 2014-03-14 ENCOUNTER — Encounter: Payer: Self-pay | Admitting: Cardiology

## 2014-03-14 ENCOUNTER — Encounter (INDEPENDENT_AMBULATORY_CARE_PROVIDER_SITE_OTHER): Payer: Medicare Other

## 2014-03-14 DIAGNOSIS — I472 Ventricular tachycardia: Secondary | ICD-10-CM

## 2014-03-14 DIAGNOSIS — I421 Obstructive hypertrophic cardiomyopathy: Secondary | ICD-10-CM

## 2014-03-14 DIAGNOSIS — I422 Other hypertrophic cardiomyopathy: Secondary | ICD-10-CM | POA: Diagnosis not present

## 2014-03-14 DIAGNOSIS — I4729 Other ventricular tachycardia: Secondary | ICD-10-CM

## 2014-03-14 DIAGNOSIS — I471 Supraventricular tachycardia, unspecified: Secondary | ICD-10-CM

## 2014-03-16 ENCOUNTER — Other Ambulatory Visit: Payer: Medicare Other | Admitting: Family Medicine

## 2014-03-16 DIAGNOSIS — I10 Essential (primary) hypertension: Secondary | ICD-10-CM | POA: Diagnosis not present

## 2014-03-16 DIAGNOSIS — E119 Type 2 diabetes mellitus without complications: Secondary | ICD-10-CM | POA: Diagnosis not present

## 2014-03-17 DIAGNOSIS — I10 Essential (primary) hypertension: Secondary | ICD-10-CM | POA: Diagnosis not present

## 2014-03-17 DIAGNOSIS — E119 Type 2 diabetes mellitus without complications: Secondary | ICD-10-CM | POA: Diagnosis not present

## 2014-03-21 ENCOUNTER — Ambulatory Visit (INDEPENDENT_AMBULATORY_CARE_PROVIDER_SITE_OTHER): Payer: Medicare Other | Admitting: Physician Assistant

## 2014-03-21 ENCOUNTER — Telehealth: Payer: Self-pay | Admitting: *Deleted

## 2014-03-21 ENCOUNTER — Encounter: Payer: Self-pay | Admitting: Physician Assistant

## 2014-03-21 ENCOUNTER — Encounter: Payer: Self-pay | Admitting: *Deleted

## 2014-03-21 VITALS — BP 130/66 | HR 64 | Ht 69.0 in | Wt >= 6400 oz

## 2014-03-21 DIAGNOSIS — R0609 Other forms of dyspnea: Secondary | ICD-10-CM

## 2014-03-21 DIAGNOSIS — R0989 Other specified symptoms and signs involving the circulatory and respiratory systems: Secondary | ICD-10-CM | POA: Diagnosis not present

## 2014-03-21 DIAGNOSIS — Z8669 Personal history of other diseases of the nervous system and sense organs: Secondary | ICD-10-CM | POA: Insufficient documentation

## 2014-03-21 DIAGNOSIS — R0600 Dyspnea, unspecified: Secondary | ICD-10-CM

## 2014-03-21 DIAGNOSIS — H538 Other visual disturbances: Secondary | ICD-10-CM

## 2014-03-21 DIAGNOSIS — G4733 Obstructive sleep apnea (adult) (pediatric): Secondary | ICD-10-CM

## 2014-03-21 DIAGNOSIS — I10 Essential (primary) hypertension: Secondary | ICD-10-CM | POA: Diagnosis not present

## 2014-03-21 DIAGNOSIS — D649 Anemia, unspecified: Secondary | ICD-10-CM

## 2014-03-21 DIAGNOSIS — I421 Obstructive hypertrophic cardiomyopathy: Secondary | ICD-10-CM | POA: Diagnosis not present

## 2014-03-21 NOTE — Progress Notes (Signed)
[facility], [place] [place], [place]  [address] Phone: [phone] Fax:  [phone]  Date:  03/21/2014   ID:  Erin Sexton, DOB 10-Aug-1971, MRN [id number]  PCP:  Oren Binet, MD  Cardiologist:  Dr. Candee Furbish => will establish in the [facility]   History of Present Illness: Erin Sexton is a 43 y.o. female  with a hx of morbid obesity, HTN, asthma.  She was admitted in 02/2014 with dyspnea and chest pain.  CT of the chest was felt to demonstrate possible small bilateral pulmonary emboli.  LE venous US was neg for DVT.  She was started on Xarelto with plans to anticoagulate for 3-6 mos.   She was then readmitted to 3/28-3/31 with vaginal bleeding and profound anemia.  She required transfusion with 4 units PRBCs. She was seen by pulmonology due to recent history of possible pulmonary embolism. Chest CT was reviewed and did not demonstrate evidence of pulmonary embolism.  VQ scan was negative. Therefore, it was felt that she did not have a pulmonary embolism and anticoagulation was discontinued.  Echocardiogram was consistent with HOCM. She was seen by cardiology. She was started on beta blocker therapy. Cardiac enzymes remained normal. Her body habitus would preclude stress testing modalities. It was felt that invasive testing should be avoided if possible. Overall, it was felt that her chest pain and shortness of breath was due to symptomatic anemia.  Event monitor was arranged to rule out ventricular arrhythmias.  She returns for follow up. Her breathing is improved. She continues to note occasional chest tightness with exertion. This is relieved by albuterol. She denies syncope. She sleeps sitting up. She denies PND. She denies significant LE edema. She is noncompliant with CPAP due to claustrophobia.  Studies:  - Echo (03/06/14):  LVH consistent with hypertrophic cardiomyopathy, vigorous LV function, EF 65-70%, LVOT gradient 7.9 m/s, normal wall motion, grade 2 diastolic  dysfunction, MAC, moderate LAE.   Recent Labs: 03/07/2014: ALT 15; Pro B Natriuretic peptide (BNP) 264.6*  03/08/2014: Creatinine 0.76; HDL Cholesterol 31*; Hemoglobin 8.6*; LDL (calc) 68; Potassium 3.9   Wt Readings from Last 3 Encounters:  03/21/14 402 lb (182.346 kg)  03/06/14 392 lb 10.2 oz (178.1 kg)  03/04/14 396 lb (179.624 kg)     Past Medical History  Diagnosis Date  . COPD (chronic obstructive pulmonary disease)   . Depression   . Hypertension   . Asthma   . Anxiety   . Anemia   . Pulmonary emboli   . Blood transfusion without reported diagnosis     Current Outpatient Prescriptions  Medication Sig Dispense Refill  . albuterol (PROVENTIL HFA;VENTOLIN HFA) 108 (90 BASE) MCG/ACT inhaler Inhale 2 puffs into the lungs every 6 (six) hours as needed for wheezing or shortness of breath.      [name] albuterol (PROVENTIL) (2.5 MG/3ML) 0.083% nebulizer solution Take 3 mLs (2.5 mg total) by nebulization every 6 (six) hours as needed for wheezing or shortness of breath.  75 mL  12  . buPROPion (WELLBUTRIN XL) 150 MG 24 hr tablet Take 1 tablet (150 mg total) by mouth daily.  30 tablet  0  . ferrous sulfate 325 (65 FE) MG tablet Take 1 tablet (325 mg total) by mouth 2 (two) times daily with a meal.  60 tablet  0  . fluticasone (FLOVENT HFA) 110 MCG/ACT inhaler Inhale 2 puffs into the lungs 2 (two) times daily.  1 Inhaler  12  . megestrol (MEGACE) 40 MG tablet Take  2 tablets (80 mg total) by mouth daily.  60 tablet  0  . metFORMIN (GLUCOPHAGE) 500 MG tablet Take 1 tablet (500 mg total) by mouth 2 (two) times daily with a meal.  60 tablet  0  . metoprolol (LOPRESSOR) 50 MG tablet Take 1 tablet (50 mg total) by mouth 2 (two) times daily.  60 tablet  0  . pravastatin (PRAVACHOL) 20 MG tablet Take 1 tablet (20 mg total) by mouth daily.  30 tablet  1   No current facility-administered medications for this visit.    Allergies:   Review of patient's allergies indicates no known allergies.    Social History:  The patient  reports that she has never smoked. She has never used smokeless tobacco. She reports that she does not drink alcohol or use illicit drugs.   Family History:  The patient's family history is not on file.   ROS:  Please see the history of present illness.   No further bleeding. She does have some blurry vision and describes floaters.   All other systems reviewed and negative.   PHYSICAL EXAM: VS:  BP 130/66  Pulse 64  Ht 5\' 9"  (1.753 m)  Wt 402 lb (182.346 kg)  BMI 59.34 kg/m2  LMP 03/02/2014 Well nourished, well developed, in no acute distress HEENT: normal Neck:  I cannot assess JVD Cardiac:  normal S1, S2; RRR; 1-7/6 systolic murmuralong the LSB Lungs:  clear to auscultation bilaterally, no wheezing, rhonchi or rales Abd: soft, nontender, no hepatomegaly Ext: trace bilateral LE edema Skin: warm and dry Neuro:  CNs 2-12 intact, no focal abnormalities noted  EKG:  NSR, HR normal axis, no ST changes     ASSESSMENT AND PLAN:  1. HOCM: I have reviewed the available strips from her event monitor. No ventricular arrhythmias have been identified at this point. As noted, given her size, she is not a candidate for any of the stress testing modalities.  Continue beta blocker. 2. Chest Pain: Her description of her symptoms today sound more consistent with asthma. Her discomfort is relieved by bronchodilator therapy. ECG is normal. As noted, it has been recommended that invasive testing be avoided. At this point, she seems to be tolerating the beta blocker. 3. Hypertension: Controlled. 4. Hyperlipidemia: Continue statin. 5. Anemia: Vaginal bleeding has stopped. She remains on iron. Follow up with primary care. 6. Blurry Vision: I do not think her medications are responsible. I have asked her to followup with optometry. 7. Morbid Obesity: We will need to try to hold discussions over time regarding weight loss and the possibility of weight loss surgery. 8. Sleep  Apnea: The patient is interested in follow up to see if there are other devices she may be able to tolerate. I will refer her back to Dr. Gwenette Greet, who saw her in the hospital. 9. Disposition: Follow up with one of our cardiologists in [place], which is where she lives.  Signed, Richardson Dopp, PA-C  03/21/2014 12:57 PM

## 2014-03-21 NOTE — Patient Instructions (Signed)
You have been referred to Dr Clance///pt has been consulted in the hospital and needs follow up appointment for sleep apnea   Your physician recommends that you schedule a follow-up appointment in: 6-8 weeks in the [place]

## 2014-03-21 NOTE — Telephone Encounter (Signed)
See documentation note

## 2014-03-21 NOTE — Progress Notes (Signed)
Patient ID: Erin Sexton, female   DOB: 1971/03/31, 43 y.o.   MRN: [id number] Patient called and informed the last day for her [employer] 21 day cardiac event monitor will be 04/03/2014.  She will mail her monitor directly back to [employer] at that time.

## 2014-03-23 DIAGNOSIS — F331 Major depressive disorder, recurrent, moderate: Secondary | ICD-10-CM | POA: Diagnosis not present

## 2014-03-24 DIAGNOSIS — E039 Hypothyroidism, unspecified: Secondary | ICD-10-CM | POA: Diagnosis not present

## 2014-03-30 DIAGNOSIS — F411 Generalized anxiety disorder: Secondary | ICD-10-CM | POA: Diagnosis not present

## 2014-04-11 ENCOUNTER — Institutional Professional Consult (permissible substitution): Payer: Medicare Other | Admitting: Pulmonary Disease

## 2014-04-27 ENCOUNTER — Ambulatory Visit (INDEPENDENT_AMBULATORY_CARE_PROVIDER_SITE_OTHER): Payer: Medicare Other | Admitting: Pulmonary Disease

## 2014-04-27 ENCOUNTER — Encounter: Payer: Self-pay | Admitting: Pulmonary Disease

## 2014-04-27 VITALS — BP 126/76 | HR 74 | Ht 69.0 in | Wt >= 6400 oz

## 2014-04-27 DIAGNOSIS — R0600 Dyspnea, unspecified: Secondary | ICD-10-CM

## 2014-04-27 DIAGNOSIS — R0609 Other forms of dyspnea: Secondary | ICD-10-CM | POA: Diagnosis not present

## 2014-04-27 DIAGNOSIS — G4733 Obstructive sleep apnea (adult) (pediatric): Secondary | ICD-10-CM | POA: Diagnosis not present

## 2014-04-27 DIAGNOSIS — R0989 Other specified symptoms and signs involving the circulatory and respiratory systems: Secondary | ICD-10-CM

## 2014-04-27 NOTE — Assessment & Plan Note (Addendum)
She reports snoring, sleep disruption, apnea, and daytime sleepiness.  She has history of HTN with diastolic dysfunction.  She reports prior history of severe sleep apnea.  I am concerned she still has sleep apnea.  She has morbid obesity, and I am concerned she could also have sleep related hypoventilation.  We discussed how sleep apnea can affect various health problems including risks for hypertension, cardiovascular disease, and diabetes.  We also discussed how sleep disruption can increase risks for accident, such as while driving.  Weight loss as a means of improving sleep apnea was also reviewed.  Additional treatment options discussed were CPAP therapy, oral appliance, and surgical intervention.  To further assess will arrange for in lab sleep study.

## 2014-04-27 NOTE — Patient Instructions (Signed)
Will arrange for sleep study Will call to arrange for follow up after sleep study reviewed 

## 2014-04-27 NOTE — Progress Notes (Signed)
Chief Complaint  Patient presents with  . Sleep Consult    referred by Richardson Dopp, PA for OSA    History of Present Illness: Erin Sexton is a 43 y.o. female for evaluation of sleep problems.  She was diagnosed with sleep apnea in 2009.  She was told she had the most severe sleep apnea.  She was tried on CPAP, but couldn't tolerate full face mask.    She was in hospital recently for vaginal bleeding.  She was noted to have trouble with her breathing, and had pulmonary evaluation.  May concern was deconditioning, obesity, and diastolic dysfunction.  There was also concern her sleep apnea was contributing to her breathing problems.    She snores, and will stop breathing while asleep.  She will wake up feeling choked.  She sleeps in a hospital bed - she can't breath if she lays flat.  She gets headaches all the time.  She goes to sleep at 11 pm.  She falls asleep after an hour.  She wakes up 3 times to use the bathroom.  She gets out of bed at 830.  She feels exhausted in the morning.  She does not use anything to help her fall sleep or stay awake.  She denies sleep walking, sleep talking, bruxism, or nightmares.  There is no history of restless legs.  She denies sleep hallucinations, sleep paralysis, or cataplexy.  The Epworth score is 16 out of 24.  Tests: Echo 03/06/14 >> hypertrophic LV, EF 65 to 16%, grade 2 diastolic dysfx V/Q scan 0/10/93 >> normal   Laterrica Libman  has a past medical history of COPD (chronic obstructive pulmonary disease); Depression; Hypertension; Asthma; Anxiety; Anemia; Pulmonary emboli; and Blood transfusion without reported diagnosis.  Renne Cornick  has no past surgical history on file.  Prior to Admission medications   Medication Sig Start Date End Date Taking? Authorizing Provider  albuterol (PROVENTIL HFA;VENTOLIN HFA) 108 (90 BASE) MCG/ACT inhaler Inhale 2 puffs into the lungs every 6 (six) hours as needed for wheezing or shortness of breath.    Yes Historical Provider, MD  albuterol (PROVENTIL) (2.5 MG/3ML) 0.083% nebulizer solution Take 3 mLs (2.5 mg total) by nebulization every 6 (six) hours as needed for wheezing or shortness of breath. 03/03/14  Yes Kathie Dike, MD  buPROPion (WELLBUTRIN XL) 150 MG 24 hr tablet Take 1 tablet (150 mg total) by mouth daily. 03/03/14  Yes Kathie Dike, MD  ferrous sulfate 325 (65 FE) MG tablet Take 1 tablet (325 mg total) by mouth 2 (two) times daily with a meal. 03/08/14  Yes Shanker Kristeen Mans, MD  fluticasone (FLOVENT HFA) 110 MCG/ACT inhaler Inhale 2 puffs into the lungs 2 (two) times daily. 03/03/14  Yes Kathie Dike, MD  megestrol (MEGACE) 40 MG tablet Take 2 tablets (80 mg total) by mouth daily. 03/08/14  Yes Shanker Kristeen Mans, MD  metFORMIN (GLUCOPHAGE) 500 MG tablet Take 1 tablet (500 mg total) by mouth 2 (two) times daily with a meal. 03/08/14  Yes Shanker Kristeen Mans, MD  metoprolol (LOPRESSOR) 50 MG tablet Take 1 tablet (50 mg total) by mouth 2 (two) times daily. 03/08/14  Yes Shanker Kristeen Mans, MD  pravastatin (PRAVACHOL) 20 MG tablet Take 1 tablet (20 mg total) by mouth daily. 03/03/14  Yes Kathie Dike, MD    No Known Allergies  Her family history is not on file.  She  reports that she has never smoked. She has never used smokeless tobacco. She reports that she  does not drink alcohol or use illicit drugs.  Review of Systems  Constitutional: Negative for fever and unexpected weight change.  HENT: Positive for congestion, ear pain and sore throat. Negative for dental problem, nosebleeds, postnasal drip, rhinorrhea, sinus pressure, sneezing and trouble swallowing.   Eyes: Negative for redness and itching.  Respiratory: Positive for cough and shortness of breath. Negative for chest tightness and wheezing.   Cardiovascular: Positive for chest pain and palpitations. Negative for leg swelling.  Gastrointestinal: Negative for nausea and vomiting.  Genitourinary: Negative for dysuria.   Musculoskeletal: Negative for joint swelling.  Skin: Negative for rash.  Neurological: Positive for headaches.  Hematological: Does not bruise/bleed easily.  Psychiatric/Behavioral: Positive for dysphoric mood. The patient is nervous/anxious.    Physical Exam:  General - No distress, obese ENT - No sinus tenderness, no oral exudate, no LAN, no thyromegaly, TM clear, pupils equal/reactive, MP 3 Cardiac - s1s2 regular, no murmur, pulses symmetric Chest - No wheeze/rales/dullness, good air entry, normal respiratory excursion Back - No focal tenderness Abd - Soft, non-tender, no organomegaly, + bowel sounds Ext - 1+ lower leg edema Neuro - Normal strength, cranial nerves intact Skin - No rashes Psych - Normal mood, and behavior  Assessment/plan:  Chesley Mires, M.D. Pager [phone]

## 2014-04-27 NOTE — Progress Notes (Deleted)
   Subjective:    Patient ID: Erin Sexton, female    DOB: March 26, 1971, 43 y.o.   MRN: [id number]  HPI    Review of Systems  Constitutional: Negative for fever and unexpected weight change.  HENT: Positive for congestion, ear pain and sore throat. Negative for dental problem, nosebleeds, postnasal drip, rhinorrhea, sinus pressure, sneezing and trouble swallowing.   Eyes: Negative for redness and itching.  Respiratory: Positive for cough and shortness of breath. Negative for chest tightness and wheezing.   Cardiovascular: Positive for chest pain and palpitations. Negative for leg swelling.  Gastrointestinal: Negative for nausea and vomiting.  Genitourinary: Negative for dysuria.  Musculoskeletal: Negative for joint swelling.  Skin: Negative for rash.  Neurological: Positive for headaches.  Hematological: Does not bruise/bleed easily.  Psychiatric/Behavioral: Positive for dysphoric mood. The patient is nervous/anxious.        Objective:   Physical Exam        Assessment & Plan:

## 2014-05-05 ENCOUNTER — Ambulatory Visit (INDEPENDENT_AMBULATORY_CARE_PROVIDER_SITE_OTHER): Payer: Medicare Other | Admitting: Cardiovascular Disease

## 2014-05-05 VITALS — BP 140/78 | HR 70 | Ht 69.0 in | Wt 382.0 lb

## 2014-05-05 DIAGNOSIS — I421 Obstructive hypertrophic cardiomyopathy: Secondary | ICD-10-CM

## 2014-05-05 DIAGNOSIS — I10 Essential (primary) hypertension: Secondary | ICD-10-CM | POA: Diagnosis not present

## 2014-05-05 DIAGNOSIS — R0609 Other forms of dyspnea: Secondary | ICD-10-CM

## 2014-05-05 DIAGNOSIS — N939 Abnormal uterine and vaginal bleeding, unspecified: Secondary | ICD-10-CM

## 2014-05-05 DIAGNOSIS — D649 Anemia, unspecified: Secondary | ICD-10-CM

## 2014-05-05 DIAGNOSIS — G4733 Obstructive sleep apnea (adult) (pediatric): Secondary | ICD-10-CM

## 2014-05-05 DIAGNOSIS — N898 Other specified noninflammatory disorders of vagina: Secondary | ICD-10-CM

## 2014-05-05 DIAGNOSIS — R0989 Other specified symptoms and signs involving the circulatory and respiratory systems: Secondary | ICD-10-CM

## 2014-05-05 DIAGNOSIS — R0600 Dyspnea, unspecified: Secondary | ICD-10-CM

## 2014-05-05 DIAGNOSIS — J45909 Unspecified asthma, uncomplicated: Secondary | ICD-10-CM

## 2014-05-05 DIAGNOSIS — E039 Hypothyroidism, unspecified: Secondary | ICD-10-CM | POA: Diagnosis not present

## 2014-05-05 NOTE — Patient Instructions (Signed)
Your physician wants you to follow-up in: 6 months You will receive a reminder letter in the mail two months in advance. If you don't receive a letter, please call our office to schedule the follow-up appointment.     Your physician recommends that you continue on your current medications as directed. Please refer to the Current Medication list given to you today.      Thank you for choosing [facility] !        

## 2014-05-05 NOTE — Progress Notes (Signed)
Patient ID: Erin Sexton, female   DOB: [birth year], 43 y.o.   MRN: [id number]      SUBJECTIVE: Erin Sexton is a 43 y.o. female with a hx of morbid obesity, HTN, asthma, and sleep apnea. She recently saw Richardson Dopp PA, and relevant history has been obtained from his visit.  She was admitted in 02/2014 with dyspnea and chest pain. CT of the chest was felt to demonstrate possible small bilateral pulmonary emboli. LE venous US was neg for DVT. She was started on Xarelto with plans to anticoagulate for 3-6 mos. She was then readmitted to 3/28-3/31 with vaginal bleeding and profound anemia. She required transfusion with 4 units PRBCs. She was seen by pulmonology due to recent history of possible pulmonary embolism. Chest CT was reviewed and did not demonstrate evidence of pulmonary embolism. VQ scan was negative. Therefore, it was felt that she did not have a pulmonary embolism and anticoagulation was discontinued. Echocardiogram was consistent with hypertrophic cardiomyopathy. She was seen by cardiology. She was started on beta blocker therapy. Cardiac enzymes remained normal. Her body habitus would preclude stress testing modalities. It was felt that invasive testing should be avoided if possible. Overall, it was felt that her chest pain and shortness of breath was due to symptomatic anemia. Event monitor was arranged to rule out ventricular arrhythmias.   She has experienced occasional chest tightness with exertion relieved with albuterol. She denies syncope. She sleeps sitting up. She denies PND. She denies significant LE edema. She saw Dr. Halford Chessman and is scheduled to undergo a sleep study on 06/09/14.  Studies:  - Echo (03/06/14): LVH consistent with hypertrophic cardiomyopathy, vigorous LV function, EF 65-70%, LVOT gradient 7.9 m/s, normal wall motion, grade 2 diastolic dysfunction, MAC, moderate LAE.   Recent Labs:  03/07/2014: ALT 15; Pro B Natriuretic peptide (BNP) 264.6*  03/08/2014: Creatinine  0.76; HDL Cholesterol 31*; Hemoglobin 8.6*; LDL (calc) 68; Potassium 3.9   Other than some very intermittent and mild vaginal spotting, she denies any profuse vaginal bleeding. She very seldom has chest tightness lasting for seconds. She has been bothered by seasonal allergies. She has lost 10 pounds. She follows up with Dr. Anastasio Champion. She does not have a lot of stamina, but has been trying to stay active every 30 minutes.  No Known Allergies  Current Outpatient Prescriptions  Medication Sig Dispense Refill  . albuterol (PROVENTIL HFA;VENTOLIN HFA) 108 (90 BASE) MCG/ACT inhaler Inhale 2 puffs into the lungs every 6 (six) hours as needed for wheezing or shortness of breath.      [name] albuterol (PROVENTIL) (2.5 MG/3ML) 0.083% nebulizer solution Take 3 mLs (2.5 mg total) by nebulization every 6 (six) hours as needed for wheezing or shortness of breath.  75 mL  12  . buPROPion (WELLBUTRIN XL) 150 MG 24 hr tablet Take 1 tablet (150 mg total) by mouth daily.  30 tablet  0  . ferrous sulfate 325 (65 FE) MG tablet Take 1 tablet (325 mg total) by mouth 2 (two) times daily with a meal.  60 tablet  0  . fluticasone (FLOVENT HFA) 110 MCG/ACT inhaler Inhale 2 puffs into the lungs 2 (two) times daily.  1 Inhaler  12  . metFORMIN (GLUCOPHAGE) 500 MG tablet Take 1 tablet (500 mg total) by mouth 2 (two) times daily with a meal.  60 tablet  0  . metoprolol (LOPRESSOR) 50 MG tablet Take 1 tablet (50 mg total) by mouth 2 (two) times daily.  60 tablet  0  .  pravastatin (PRAVACHOL) 20 MG tablet Take 1 tablet (20 mg total) by mouth daily.  30 tablet  1   No current facility-administered medications for this visit.    Past Medical History  Diagnosis Date  . COPD (chronic obstructive pulmonary disease)   . Depression   . Hypertension   . Asthma   . Anxiety   . Anemia   . Pulmonary emboli   . Blood transfusion without reported diagnosis     No past surgical history on file.  History   Social History  .  Marital Status: Married    Spouse Name: N/A    Number of Children: N/A  . Years of Education: N/A   Occupational History  . Not on file.   Social History Main Topics  . Smoking status: Never Smoker   . Smokeless tobacco: Never Used  . Alcohol Use: No  . Drug Use: No  . Sexual Activity: Not on file   Other Topics Concern  . Not on file   Social History Narrative  . No narrative on file     Filed Vitals:   05/05/14 0837  BP: 140/78  Pulse: 70  Height: 5\' 9"  (1.753 m)  Weight: 382 lb (173.274 kg)  SpO2: 99%    PHYSICAL EXAM General: NAD, morbidly obese Neck: No JVD, no thyromegaly. Lungs: Clear to auscultation bilaterally with normal respiratory effort. CV: Nondisplaced PMI.  Regular rate and rhythm, normal S1/S2, no S3/S4, soft I/VI pansystolic murmur along left sternal border. No pretibial or periankle edema.   Abdomen: Morbidly obese.  Neurologic: Alert and oriented x 3.  Psych: Normal affect. Extremities: No clubbing or cyanosis.   ECG: reviewed and available in electronic records.  Echo (03-06-14): Study Conclusions  - Left ventricle: The cavity size was normal. There was hypertrophy, consistent with hypertrophic cardiomyopathy. Systolic function was vigorous. The estimated ejection fraction was in the range of 65% to 70%. There was elevated outflow tract gradient (7.30m/s). Wall motion was normal; there were no regional wall motion abnormalities. Features are consistent with a pseudonormal left ventricular filling pattern, with concomitant abnormal relaxation and increased filling pressure (grade 2 diastolic dysfunction). - Mitral valve: Calcified annulus. - Left atrium: The atrium was moderately dilated.     ASSESSMENT AND PLAN: 1. Hypertrophic cardiomyopathy: Event monitor did not demonstrate any atrial or ventricular arrhythmias, with only 4 PVC's noted. As noted, given her size, she is not a candidate for any of the stress testing modalities.  Continue beta blocker. 2. Chest Pain: Very transient and intermittent, and more consistent with asthma. Her discomfort is relieved by bronchodilator therapy. ECG is normal. As noted, it has been recommended that invasive testing be avoided. At this point, she seems to be tolerating the beta blocker. 3. Hypertension: Controlled. 4. Hyperlipidemia: Continue statin. 5. Anemia: Vaginal bleeding has stopped. She remains on iron. Follow up with primary care. 6. Morbid Obesity: We discussed dietary and exercise strategies for continued weight loss. She has lost 10 lbs thus far for which I congratulated her. 7. Sleep Apnea: Followed by Dr. Halford Chessman. Sleep study on 06/09/14.  Dispo: f/u 6 months.  Kate Sable, M.D., F.A.C.C.

## 2014-05-09 ENCOUNTER — Other Ambulatory Visit: Payer: Self-pay | Admitting: Internal Medicine

## 2014-05-14 ENCOUNTER — Observation Stay (HOSPITAL_BASED_OUTPATIENT_CLINIC_OR_DEPARTMENT_OTHER)
Admission: EM | Admit: 2014-05-14 | Discharge: 2014-05-17 | Disposition: A | Discharge status: Home / Self Care | Payer: Medicare Other | Source: Home / Self Care | Attending: Internal Medicine | Admitting: Internal Medicine

## 2014-05-14 ENCOUNTER — Encounter (HOSPITAL_COMMUNITY): Payer: Self-pay | Admitting: Emergency Medicine

## 2014-05-14 DIAGNOSIS — T83511A Infection and inflammatory reaction due to indwelling urethral catheter, initial encounter: Secondary | ICD-10-CM | POA: Diagnosis present

## 2014-05-14 DIAGNOSIS — N921 Excessive and frequent menstruation with irregular cycle: Secondary | ICD-10-CM | POA: Diagnosis not present

## 2014-05-14 DIAGNOSIS — E039 Hypothyroidism, unspecified: Secondary | ICD-10-CM | POA: Diagnosis present

## 2014-05-14 DIAGNOSIS — R5383 Other fatigue: Secondary | ICD-10-CM

## 2014-05-14 DIAGNOSIS — R55 Syncope and collapse: Secondary | ICD-10-CM | POA: Diagnosis present

## 2014-05-14 DIAGNOSIS — D500 Iron deficiency anemia secondary to blood loss (chronic): Secondary | ICD-10-CM | POA: Diagnosis present

## 2014-05-14 DIAGNOSIS — E785 Hyperlipidemia, unspecified: Secondary | ICD-10-CM | POA: Diagnosis present

## 2014-05-14 DIAGNOSIS — J449 Chronic obstructive pulmonary disease, unspecified: Secondary | ICD-10-CM | POA: Insufficient documentation

## 2014-05-14 DIAGNOSIS — Y846 Urinary catheterization as the cause of abnormal reaction of the patient, or of later complication, without mention of misadventure at the time of the procedure: Secondary | ICD-10-CM | POA: Diagnosis present

## 2014-05-14 DIAGNOSIS — I509 Heart failure, unspecified: Secondary | ICD-10-CM | POA: Diagnosis present

## 2014-05-14 DIAGNOSIS — A498 Other bacterial infections of unspecified site: Secondary | ICD-10-CM | POA: Diagnosis present

## 2014-05-14 DIAGNOSIS — I10 Essential (primary) hypertension: Secondary | ICD-10-CM | POA: Diagnosis present

## 2014-05-14 DIAGNOSIS — D649 Anemia, unspecified: Secondary | ICD-10-CM | POA: Insufficient documentation

## 2014-05-14 DIAGNOSIS — I214 Non-ST elevation (NSTEMI) myocardial infarction: Secondary | ICD-10-CM | POA: Diagnosis not present

## 2014-05-14 DIAGNOSIS — N898 Other specified noninflammatory disorders of vagina: Secondary | ICD-10-CM | POA: Diagnosis not present

## 2014-05-14 DIAGNOSIS — R5381 Other malaise: Secondary | ICD-10-CM | POA: Diagnosis present

## 2014-05-14 DIAGNOSIS — Z6841 Body Mass Index (BMI) 40.0 and over, adult: Secondary | ICD-10-CM

## 2014-05-14 DIAGNOSIS — J4489 Other specified chronic obstructive pulmonary disease: Secondary | ICD-10-CM | POA: Insufficient documentation

## 2014-05-14 DIAGNOSIS — E876 Hypokalemia: Secondary | ICD-10-CM | POA: Diagnosis not present

## 2014-05-14 DIAGNOSIS — N920 Excessive and frequent menstruation with regular cycle: Secondary | ICD-10-CM | POA: Diagnosis present

## 2014-05-14 DIAGNOSIS — R079 Chest pain, unspecified: Secondary | ICD-10-CM | POA: Diagnosis not present

## 2014-05-14 DIAGNOSIS — F411 Generalized anxiety disorder: Secondary | ICD-10-CM | POA: Diagnosis present

## 2014-05-14 DIAGNOSIS — F3289 Other specified depressive episodes: Secondary | ICD-10-CM | POA: Insufficient documentation

## 2014-05-14 DIAGNOSIS — N925 Other specified irregular menstruation: Secondary | ICD-10-CM | POA: Insufficient documentation

## 2014-05-14 DIAGNOSIS — I421 Obstructive hypertrophic cardiomyopathy: Secondary | ICD-10-CM | POA: Diagnosis present

## 2014-05-14 DIAGNOSIS — E1142 Type 2 diabetes mellitus with diabetic polyneuropathy: Secondary | ICD-10-CM | POA: Diagnosis present

## 2014-05-14 DIAGNOSIS — G4733 Obstructive sleep apnea (adult) (pediatric): Secondary | ICD-10-CM | POA: Diagnosis present

## 2014-05-14 DIAGNOSIS — J45909 Unspecified asthma, uncomplicated: Secondary | ICD-10-CM

## 2014-05-14 DIAGNOSIS — I5033 Acute on chronic diastolic (congestive) heart failure: Secondary | ICD-10-CM | POA: Diagnosis not present

## 2014-05-14 DIAGNOSIS — Z8249 Family history of ischemic heart disease and other diseases of the circulatory system: Secondary | ICD-10-CM

## 2014-05-14 DIAGNOSIS — N390 Urinary tract infection, site not specified: Secondary | ICD-10-CM | POA: Diagnosis present

## 2014-05-14 DIAGNOSIS — I2699 Other pulmonary embolism without acute cor pulmonale: Secondary | ICD-10-CM

## 2014-05-14 DIAGNOSIS — F329 Major depressive disorder, single episode, unspecified: Secondary | ICD-10-CM | POA: Diagnosis present

## 2014-05-14 DIAGNOSIS — Z79899 Other long term (current) drug therapy: Secondary | ICD-10-CM

## 2014-05-14 DIAGNOSIS — N939 Abnormal uterine and vaginal bleeding, unspecified: Secondary | ICD-10-CM

## 2014-05-14 DIAGNOSIS — N938 Other specified abnormal uterine and vaginal bleeding: Secondary | ICD-10-CM | POA: Insufficient documentation

## 2014-05-14 DIAGNOSIS — N949 Unspecified condition associated with female genital organs and menstrual cycle: Secondary | ICD-10-CM | POA: Insufficient documentation

## 2014-05-14 DIAGNOSIS — I252 Old myocardial infarction: Secondary | ICD-10-CM

## 2014-05-14 DIAGNOSIS — R6889 Other general symptoms and signs: Secondary | ICD-10-CM | POA: Diagnosis not present

## 2014-05-14 DIAGNOSIS — R0600 Dyspnea, unspecified: Secondary | ICD-10-CM

## 2014-05-14 DIAGNOSIS — E1149 Type 2 diabetes mellitus with other diabetic neurological complication: Secondary | ICD-10-CM | POA: Diagnosis present

## 2014-05-14 DIAGNOSIS — R0902 Hypoxemia: Secondary | ICD-10-CM | POA: Diagnosis not present

## 2014-05-14 DIAGNOSIS — D62 Acute posthemorrhagic anemia: Secondary | ICD-10-CM | POA: Diagnosis present

## 2014-05-14 HISTORY — DX: Type 2 diabetes mellitus without complications: E11.9

## 2014-05-14 LAB — BASIC METABOLIC PANEL
BUN: 16 mg/dL (ref 6–23)
CHLORIDE: 107 meq/L (ref 96–112)
CO2: 21 mEq/L (ref 19–32)
CREATININE: 0.82 mg/dL (ref 0.50–1.10)
Calcium: 8.8 mg/dL (ref 8.4–10.5)
GFR calc non Af Amer: 85 mL/min — ABNORMAL LOW (ref >90)
Glucose, Bld: 177 mg/dL — ABNORMAL HIGH (ref 70–99)
POTASSIUM: 4.1 meq/L (ref 3.7–5.3)
SODIUM: 142 meq/L (ref 137–147)

## 2014-05-14 LAB — URINALYSIS, ROUTINE W REFLEX MICROSCOPIC
Bilirubin Urine: NEGATIVE
Glucose, UA: NEGATIVE mg/dL
KETONES UR: NEGATIVE mg/dL
LEUKOCYTES UA: NEGATIVE
NITRITE: NEGATIVE
Protein, ur: 100 mg/dL — AB
SPECIFIC GRAVITY, URINE: 1.027 (ref 1.005–1.030)
Urobilinogen, UA: 0.2 mg/dL (ref 0.0–1.0)
pH: 5 (ref 5.0–8.0)

## 2014-05-14 LAB — CBC WITH DIFFERENTIAL/PLATELET
BASOS PCT: 0 % (ref 0–1)
Basophils Absolute: 0.1 10*3/uL (ref 0.0–0.1)
Eosinophils Absolute: 0.3 10*3/uL (ref 0.0–0.7)
Eosinophils Relative: 3 % (ref 0–5)
HEMATOCRIT: 33 % — AB (ref 36.0–46.0)
HEMOGLOBIN: 10.6 g/dL — AB (ref 12.0–15.0)
Lymphocytes Relative: 23 % (ref 12–46)
Lymphs Abs: 2.6 10*3/uL (ref 0.7–4.0)
MCH: 27 pg (ref 26.0–34.0)
MCHC: 32.1 g/dL (ref 30.0–36.0)
MCV: 84.2 fL (ref 78.0–100.0)
Monocytes Absolute: 0.5 10*3/uL (ref 0.1–1.0)
Monocytes Relative: 4 % (ref 3–12)
NEUTROS ABS: 8 10*3/uL — AB (ref 1.7–7.7)
NEUTROS PCT: 70 % (ref 43–77)
Platelets: 311 10*3/uL (ref 150–400)
RBC: 3.92 MIL/uL (ref 3.87–5.11)
RDW: 13.4 % (ref 11.5–15.5)
WBC: 11.5 10*3/uL — ABNORMAL HIGH (ref 4.0–10.5)

## 2014-05-14 LAB — WET PREP, GENITAL
TRICH WET PREP: NONE SEEN
Yeast Wet Prep HPF POC: NONE SEEN

## 2014-05-14 LAB — URINE MICROSCOPIC-ADD ON

## 2014-05-14 LAB — POC URINE PREG, ED: Preg Test, Ur: NEGATIVE

## 2014-05-14 MED ORDER — SODIUM CHLORIDE 0.9 % IV BOLUS (SEPSIS)
1000.0000 mL | Freq: Once | INTRAVENOUS | Status: AC
  Administered 2014-05-14: 1000 mL via INTRAVENOUS

## 2014-05-14 MED ORDER — FENTANYL CITRATE 0.05 MG/ML IJ SOLN
50.0000 ug | Freq: Once | INTRAMUSCULAR | Status: AC
  Administered 2014-05-15: 50 ug via INTRAVENOUS
  Filled 2014-05-14: qty 2

## 2014-05-14 NOTE — ED Provider Notes (Signed)
CSN: [id number]     Arrival date & time 05/14/14  2021 History   First MD Initiated Contact with Patient 05/14/14 2027     Chief Complaint  Patient presents with  . Vaginal Bleeding     (Consider location/radiation/quality/duration/timing/severity/associated sxs/prior Treatment) HPI Comments: Patient presents to the emergency department with a chief complaint of vaginal bleeding. She states she's been bleeding for the past week. She states that she uses about 12 pads per day. She states that she feels dizzy, lightheaded, and weak all day. She also complains of uterine pain. She has not tried anything to alleviate her symptoms. She has had this problem in the past, and has required transfusion. She states the last time, she was placed on Megace, and the bleeding stopped. She states that she is supposed to see her OB GYN on Monday, but could not wait.  The history is provided by the patient. No language interpreter was used.    Past Medical History  Diagnosis Date  . COPD (chronic obstructive pulmonary disease)   . Depression   . Hypertension   . Asthma   . Anxiety   . Anemia   . Pulmonary emboli   . Blood transfusion without reported diagnosis    History reviewed. No pertinent past surgical history. No family history on file. History  Substance Use Topics  . Smoking status: Never Smoker   . Smokeless tobacco: Never Used  . Alcohol Use: No   OB History   Grav Para Term Preterm Abortions TAB SAB Ect Mult Living            0     Review of Systems  Constitutional: Negative for fever and chills.  Respiratory: Negative for shortness of breath.   Cardiovascular: Negative for chest pain.  Gastrointestinal: Negative for nausea, vomiting, diarrhea and constipation.  Genitourinary: Positive for vaginal bleeding. Negative for dysuria.  Neurological: Positive for weakness.      Allergies  Review of patient's allergies indicates no known allergies.  Home Medications   Prior to  Admission medications   Medication Sig Start Date End Date Taking? Authorizing Provider  albuterol (PROVENTIL HFA;VENTOLIN HFA) 108 (90 BASE) MCG/ACT inhaler Inhale 2 puffs into the lungs every 6 (six) hours as needed for wheezing or shortness of breath.    Historical Provider, MD  albuterol (PROVENTIL) (2.5 MG/3ML) 0.083% nebulizer solution Take 3 mLs (2.5 mg total) by nebulization every 6 (six) hours as needed for wheezing or shortness of breath. 03/03/14   Kathie Dike, MD  buPROPion (WELLBUTRIN XL) 150 MG 24 hr tablet Take 1 tablet (150 mg total) by mouth daily. 03/03/14   Kathie Dike, MD  ferrous sulfate 325 (65 FE) MG tablet Take 1 tablet (325 mg total) by mouth 2 (two) times daily with a meal. 03/08/14   Shanker Kristeen Mans, MD  fluticasone (FLOVENT HFA) 110 MCG/ACT inhaler Inhale 2 puffs into the lungs 2 (two) times daily. 03/03/14   Kathie Dike, MD  metFORMIN (GLUCOPHAGE) 500 MG tablet Take 1 tablet (500 mg total) by mouth 2 (two) times daily with a meal. 03/08/14   Shanker Kristeen Mans, MD  metoprolol (LOPRESSOR) 50 MG tablet Take 1 tablet (50 mg total) by mouth 2 (two) times daily. 03/08/14   Shanker Kristeen Mans, MD  pravastatin (PRAVACHOL) 20 MG tablet Take 1 tablet (20 mg total) by mouth daily. 03/03/14   Kathie Dike, MD   BP 126/77  Pulse 74  Temp(Src) 98.1 F (36.7 C) (Oral)  Resp 18  SpO2 98% Physical Exam  Nursing note and vitals reviewed. Constitutional: She is oriented to person, place, and time. She appears well-developed and well-nourished.  HENT:  Head: Normocephalic and atraumatic.  Eyes: Conjunctivae and EOM are normal. Pupils are equal, round, and reactive to light.  Neck: Normal range of motion. Neck supple.  Cardiovascular: Normal rate and regular rhythm.  Exam reveals no gallop and no friction rub.   No murmur heard. Pulmonary/Chest: Effort normal and breath sounds normal. No respiratory distress. She has no wheezes. She has no rales. She exhibits no tenderness.   Abdominal: Soft. Bowel sounds are normal. She exhibits no distension and no mass. There is no tenderness. There is no rebound and no guarding.  Genitourinary:  Pelvic exam chaperoned by female ER tech, remarkable for large blood clots, and steady oozing, no evidence of hemorrhage, no right or left adnexal tenderness, no uterine tenderness, no CMT or friability, no foreign body, no injury to the external genitalia, no other significant findings   Musculoskeletal: Normal range of motion. She exhibits no edema and no tenderness.  Neurological: She is alert and oriented to person, place, and time.  Skin: Skin is warm and dry.  Psychiatric: She has a normal mood and affect. Her behavior is normal. Judgment and thought content normal.    ED Course  Procedures (including critical care time) Results for orders placed during the hospital encounter of 05/14/14  WET PREP, GENITAL      Result Value Ref Range   Yeast Wet Prep HPF POC NONE SEEN  NONE SEEN   Trich, Wet Prep NONE SEEN  NONE SEEN   Clue Cells Wet Prep HPF POC RARE (*) NONE SEEN   WBC, Wet Prep HPF POC RARE (*) NONE SEEN  CBC WITH DIFFERENTIAL      Result Value Ref Range   WBC 11.5 (*) 4.0 - 10.5 K/uL   RBC 3.92  3.87 - 5.11 MIL/uL   Hemoglobin 10.6 (*) 12.0 - 15.0 g/dL   HCT 33.0 (*) 36.0 - 46.0 %   MCV 84.2  78.0 - 100.0 fL   MCH 27.0  26.0 - 34.0 pg   MCHC 32.1  30.0 - 36.0 g/dL   RDW 13.4  11.5 - 15.5 %   Platelets 311  150 - 400 K/uL   Neutrophils Relative % 70  43 - 77 %   Neutro Abs 8.0 (*) 1.7 - 7.7 K/uL   Lymphocytes Relative 23  12 - 46 %   Lymphs Abs 2.6  0.7 - 4.0 K/uL   Monocytes Relative 4  3 - 12 %   Monocytes Absolute 0.5  0.1 - 1.0 K/uL   Eosinophils Relative 3  0 - 5 %   Eosinophils Absolute 0.3  0.0 - 0.7 K/uL   Basophils Relative 0  0 - 1 %   Basophils Absolute 0.1  0.0 - 0.1 K/uL  BASIC METABOLIC PANEL      Result Value Ref Range   Sodium 142  137 - 147 mEq/L   Potassium 4.1  3.7 - 5.3 mEq/L    Chloride 107  96 - 112 mEq/L   CO2 21  19 - 32 mEq/L   Glucose, Bld 177 (*) 70 - 99 mg/dL   BUN 16  6 - 23 mg/dL   Creatinine, Ser 0.82  0.50 - 1.10 mg/dL   Calcium 8.8  8.4 - 10.5 mg/dL   GFR calc non Af Amer 85 (*) >90 mL/min   GFR calc Af  Amer >90  >90 mL/min  URINALYSIS, ROUTINE W REFLEX MICROSCOPIC      Result Value Ref Range   Color, Urine YELLOW  YELLOW   APPearance CLOUDY (*) CLEAR   Specific Gravity, Urine 1.027  1.005 - 1.030   pH 5.0  5.0 - 8.0   Glucose, UA NEGATIVE  NEGATIVE mg/dL   Hgb urine dipstick LARGE (*) NEGATIVE   Bilirubin Urine NEGATIVE  NEGATIVE   Ketones, ur NEGATIVE  NEGATIVE mg/dL   Protein, ur 100 (*) NEGATIVE mg/dL   Urobilinogen, UA 0.2  0.0 - 1.0 mg/dL   Nitrite NEGATIVE  NEGATIVE   Leukocytes, UA NEGATIVE  NEGATIVE  URINE MICROSCOPIC-ADD ON      Result Value Ref Range   RBC / HPF TOO NUMEROUS TO COUNT  <3 RBC/hpf  POC URINE PREG, ED      Result Value Ref Range   Preg Test, Ur NEGATIVE  NEGATIVE  I-STAT CHEM 8, ED      Result Value Ref Range   Sodium 145  137 - 147 mEq/L   Potassium 3.7  3.7 - 5.3 mEq/L   Chloride 110  96 - 112 mEq/L   BUN 14  6 - 23 mg/dL   Creatinine, Ser 0.90  0.50 - 1.10 mg/dL   Glucose, Bld 130 (*) 70 - 99 mg/dL   Calcium, Ion 1.16  1.12 - 1.23 mmol/L   TCO2 21  0 - 100 mmol/L   Hemoglobin 9.2 (*) 12.0 - 15.0 g/dL   HCT 27.0 (*) 36.0 - 46.0 %  TYPE AND SCREEN      Result Value Ref Range   ABO/RH(D) O POS     Antibody Screen NEG     Sample Expiration 05/17/2014     No results found.    EKG Interpretation None      MDM   Final diagnoses:  Vaginal bleeding      12:00 AM Discussed patient with Dr. Gala Romney from OBGYN, who recommends giving megace, rechecking H/H, and [place].  Recommends admission to hospitalist, unless significant drop in H/H.  1:02 AM Will admit the patient to the hospitalist service. Discussed patient with Dr. Arnoldo Morale, her request formal consult from OB/GYN. Talked with Dr. Gala Romney again,  who will consult tomorrow morning at 9:00. Will proceed with pelvic ultrasound. Will give Megace.    Montine Circle, PA-C 05/15/14 0120

## 2014-05-14 NOTE — ED Notes (Signed)
Bed: WA01 Expected date: 05/14/14 Expected time: 8:06 PM Means of arrival: Ambulance Comments: 43 yo F  Vaginal bleed

## 2014-05-14 NOTE — ED Notes (Signed)
Per EMS, pt here for vaginal bleed for the past week. Pt sts she is going through 12 pads a day. Pt has cardiac hx, asthma, anemia, obesity. A&Ox4. Pt c/o weakness all day. Pt c/o uterine pain.

## 2014-05-14 NOTE — ED Notes (Signed)
Per pt, Pt was placed on xarelto. Earlier this year due to clots in her lungs. Once on xarelto, her lungs cleared up but she had "vaginal hemorrhaging." Pt taken off xarelto and once the medication was out of her system the bleeding stopped. Pt is still off of the xarelto but vaginal bleeding started 1 week ago. Pt also c/o lower abdominal pain.

## 2014-05-15 ENCOUNTER — Inpatient Hospital Stay (HOSPITAL_COMMUNITY): Payer: Medicare Other

## 2014-05-15 ENCOUNTER — Emergency Department (HOSPITAL_COMMUNITY): Payer: Medicare Other

## 2014-05-15 ENCOUNTER — Encounter (HOSPITAL_COMMUNITY): Payer: Self-pay | Admitting: General Practice

## 2014-05-15 DIAGNOSIS — I421 Obstructive hypertrophic cardiomyopathy: Secondary | ICD-10-CM

## 2014-05-15 DIAGNOSIS — D62 Acute posthemorrhagic anemia: Secondary | ICD-10-CM

## 2014-05-15 DIAGNOSIS — I10 Essential (primary) hypertension: Secondary | ICD-10-CM

## 2014-05-15 DIAGNOSIS — N852 Hypertrophy of uterus: Secondary | ICD-10-CM | POA: Diagnosis not present

## 2014-05-15 DIAGNOSIS — N898 Other specified noninflammatory disorders of vagina: Secondary | ICD-10-CM | POA: Diagnosis not present

## 2014-05-15 DIAGNOSIS — D649 Anemia, unspecified: Secondary | ICD-10-CM | POA: Insufficient documentation

## 2014-05-15 DIAGNOSIS — J45909 Unspecified asthma, uncomplicated: Secondary | ICD-10-CM

## 2014-05-15 DIAGNOSIS — R55 Syncope and collapse: Secondary | ICD-10-CM

## 2014-05-15 LAB — HEMOGLOBIN AND HEMATOCRIT, BLOOD
HCT: 28.9 % — ABNORMAL LOW (ref 36.0–46.0)
HCT: 23.1 % — ABNORMAL LOW (ref 36.0–46.0)
Hemoglobin: 9.6 g/dL — ABNORMAL LOW (ref 12.0–15.0)
Hemoglobin: 7.5 g/dL — ABNORMAL LOW (ref 12.0–15.0)

## 2014-05-15 LAB — CBC
HCT: 26.8 % — ABNORMAL LOW (ref 36.0–46.0)
Hemoglobin: 8.7 g/dL — ABNORMAL LOW (ref 12.0–15.0)
MCH: 27.6 pg (ref 26.0–34.0)
MCHC: 32.5 g/dL (ref 30.0–36.0)
MCV: 85.1 fL (ref 78.0–100.0)
PLATELETS: 304 10*3/uL (ref 150–400)
RBC: 3.15 MIL/uL — AB (ref 3.87–5.11)
RDW: 13.5 % (ref 11.5–15.5)
WBC: 15.6 10*3/uL — ABNORMAL HIGH (ref 4.0–10.5)

## 2014-05-15 LAB — VITAMIN B12: Vitamin B-12: 423 pg/mL (ref 211–911)

## 2014-05-15 LAB — LIPID PANEL
Cholesterol: 124 mg/dL (ref 0–200)
HDL: 25 mg/dL — ABNORMAL LOW (ref >39)
LDL CALC: 77 mg/dL (ref 0–99)
TRIGLYCERIDES: 111 mg/dL (ref <150)
Total CHOL/HDL Ratio: 5 RATIO
VLDL: 22 mg/dL (ref 0–40)

## 2014-05-15 LAB — BASIC METABOLIC PANEL
BUN: 15 mg/dL (ref 6–23)
CALCIUM: 8.6 mg/dL (ref 8.4–10.5)
CO2: 23 mEq/L (ref 19–32)
Chloride: 108 mEq/L (ref 96–112)
Creatinine, Ser: 0.83 mg/dL (ref 0.50–1.10)
GFR calc Af Amer: >90 mL/min (ref >90)
GFR, EST NON AFRICAN AMERICAN: 84 mL/min — AB (ref >90)
GLUCOSE: 167 mg/dL — AB (ref 70–99)
Potassium: 4.4 mEq/L (ref 3.7–5.3)
SODIUM: 144 meq/L (ref 137–147)

## 2014-05-15 LAB — GLUCOSE, CAPILLARY
GLUCOSE-CAPILLARY: 170 mg/dL — AB (ref 70–99)
GLUCOSE-CAPILLARY: 189 mg/dL — AB (ref 70–99)
Glucose-Capillary: 176 mg/dL — ABNORMAL HIGH (ref 70–99)
Glucose-Capillary: 167 mg/dL — ABNORMAL HIGH (ref 70–99)

## 2014-05-15 LAB — I-STAT CHEM 8, ED
BUN: 14 mg/dL (ref 6–23)
CALCIUM ION: 1.16 mmol/L (ref 1.12–1.23)
Chloride: 110 mEq/L (ref 96–112)
Creatinine, Ser: 0.9 mg/dL (ref 0.50–1.10)
GLUCOSE: 130 mg/dL — AB (ref 70–99)
HEMATOCRIT: 27 % — AB (ref 36.0–46.0)
HEMOGLOBIN: 9.2 g/dL — AB (ref 12.0–15.0)
Potassium: 3.7 mEq/L (ref 3.7–5.3)
Sodium: 145 mEq/L (ref 137–147)
TCO2: 21 mmol/L (ref 0–100)

## 2014-05-15 LAB — FERRITIN: Ferritin: 9 ng/mL — ABNORMAL LOW (ref 10–291)

## 2014-05-15 LAB — MRSA PCR SCREENING: MRSA by PCR: POSITIVE — AB

## 2014-05-15 LAB — RETICULOCYTES
RBC.: 2.94 MIL/uL — AB (ref 3.87–5.11)
RETIC COUNT ABSOLUTE: 29.4 10*3/uL (ref 19.0–186.0)
RETIC CT PCT: 1 % (ref 0.4–3.1)

## 2014-05-15 LAB — RAPID URINE DRUG SCREEN, HOSP PERFORMED
AMPHETAMINES: NOT DETECTED
Barbiturates: NOT DETECTED
Benzodiazepines: NOT DETECTED
Cocaine: NOT DETECTED
Opiates: NOT DETECTED
TETRAHYDROCANNABINOL: NOT DETECTED

## 2014-05-15 LAB — PREPARE RBC (CROSSMATCH)

## 2014-05-15 LAB — IRON AND TIBC
Iron: 15 ug/dL — ABNORMAL LOW (ref 42–135)
Saturation Ratios: 5 % — ABNORMAL LOW (ref 20–55)
TIBC: 276 ug/dL (ref 250–470)
UIBC: 261 ug/dL (ref 125–400)

## 2014-05-15 LAB — HEMOGLOBIN A1C
Hgb A1c MFr Bld: 6.3 % — ABNORMAL HIGH
Mean Plasma Glucose: 134 mg/dL — ABNORMAL HIGH

## 2014-05-15 LAB — FOLATE: Folate: >20 ng/mL

## 2014-05-15 MED ORDER — SODIUM CHLORIDE 0.9 % IV SOLN
INTRAVENOUS | Status: DC
  Administered 2014-05-15 – 2014-05-17 (×3): via INTRAVENOUS

## 2014-05-15 MED ORDER — SODIUM CHLORIDE 0.9 % IJ SOLN
3.0000 mL | Freq: Two times a day (BID) | INTRAMUSCULAR | Status: DC
  Administered 2014-05-15: 3 mL via INTRAVENOUS

## 2014-05-15 MED ORDER — MEGESTROL ACETATE 40 MG PO TABS: 120.0000 mg | ORAL_TABLET | Freq: Every day | ORAL | Status: DC

## 2014-05-15 MED ORDER — SIMVASTATIN 10 MG PO TABS
10.0000 mg | ORAL_TABLET | Freq: Every day | ORAL | Status: DC
  Administered 2014-05-15: 10 mg via ORAL
  Filled 2014-05-15 (×3): qty 1

## 2014-05-15 MED ORDER — MEDROXYPROGESTERONE ACETATE 10 MG PO TABS
10.0000 mg | ORAL_TABLET | Freq: Every day | ORAL | Status: DC
  Administered 2014-05-15: 10 mg via ORAL
  Filled 2014-05-15 (×2): qty 1

## 2014-05-15 MED ORDER — FERROUS SULFATE 325 (65 FE) MG PO TABS
325.0000 mg | ORAL_TABLET | Freq: Two times a day (BID) | ORAL | Status: DC
  Administered 2014-05-15 – 2014-05-17 (×5): 325 mg via ORAL
  Filled 2014-05-15 (×7): qty 1

## 2014-05-15 MED ORDER — BUPROPION HCL ER (XL) 150 MG PO TB24
150.0000 mg | ORAL_TABLET | Freq: Every day | ORAL | Status: DC
  Administered 2014-05-15 – 2014-05-17 (×3): 150 mg via ORAL
  Filled 2014-05-15 (×4): qty 1

## 2014-05-15 MED ORDER — ONDANSETRON 4 MG PO TBDP: 4.0000 mg | ORAL_TABLET | Freq: Three times a day (TID) | ORAL | Status: DC | PRN

## 2014-05-15 MED ORDER — HYDROMORPHONE HCL PF 1 MG/ML IJ SOLN
0.5000 mg | INTRAMUSCULAR | Status: DC | PRN
  Administered 2014-05-15: 1 mg via INTRAVENOUS
  Administered 2014-05-15: 0.5 mg via INTRAVENOUS
  Administered 2014-05-15: 1 mg via INTRAVENOUS
  Filled 2014-05-15 (×3): qty 1

## 2014-05-15 MED ORDER — MEGESTROL ACETATE 40 MG/ML PO SUSP
800.0000 mg | Freq: Every day | ORAL | Status: DC
  Filled 2014-05-15: qty 20

## 2014-05-15 MED ORDER — OXYCODONE HCL 5 MG PO TABS
5.0000 mg | ORAL_TABLET | ORAL | Status: DC | PRN
  Administered 2014-05-15: 5 mg via ORAL
  Filled 2014-05-15: qty 1

## 2014-05-15 MED ORDER — ALBUTEROL SULFATE (2.5 MG/3ML) 0.083% IN NEBU
2.5000 mg | INHALATION_SOLUTION | Freq: Four times a day (QID) | RESPIRATORY_TRACT | Status: DC | PRN
  Administered 2014-05-16: 2.5 mg via RESPIRATORY_TRACT
  Filled 2014-05-15 (×2): qty 3

## 2014-05-15 MED ORDER — MUPIROCIN 2 % EX OINT
1.0000 | TOPICAL_OINTMENT | Freq: Two times a day (BID) | CUTANEOUS | Status: DC
Start: 2014-05-15 — End: 2014-05-17
  Administered 2014-05-15 – 2014-05-17 (×4): 1 via NASAL
  Filled 2014-05-15 (×2): qty 22

## 2014-05-15 MED ORDER — OXYCODONE HCL 5 MG PO TABS: 5.0000 mg | ORAL_TABLET | Freq: Four times a day (QID) | ORAL | Status: DC | PRN

## 2014-05-15 MED ORDER — ALBUTEROL SULFATE HFA 108 (90 BASE) MCG/ACT IN AERS: 2.0000 | INHALATION_SPRAY | Freq: Four times a day (QID) | RESPIRATORY_TRACT | Status: DC | PRN

## 2014-05-15 MED ORDER — ACETAMINOPHEN 325 MG PO TABS
650.0000 mg | ORAL_TABLET | Freq: Four times a day (QID) | ORAL | Status: DC | PRN
  Administered 2014-05-15: 650 mg via ORAL
  Filled 2014-05-15: qty 2

## 2014-05-15 MED ORDER — METOPROLOL TARTRATE 50 MG PO TABS: 50.0000 mg | ORAL_TABLET | Freq: Two times a day (BID) | ORAL | Status: DC

## 2014-05-15 MED ORDER — PROGESTERONE MICRONIZED 200 MG PO CAPS
200.0000 mg | ORAL_CAPSULE | Freq: Every day | ORAL | Status: DC
  Filled 2014-05-15: qty 1

## 2014-05-15 MED ORDER — CHLORHEXIDINE GLUCONATE CLOTH 2 % EX PADS
6.0000 | MEDICATED_PAD | Freq: Every day | CUTANEOUS | Status: DC
Start: 2014-05-15 — End: 2014-05-17
  Administered 2014-05-15 – 2014-05-17 (×3): 6 via TOPICAL

## 2014-05-15 MED ORDER — SODIUM CHLORIDE 0.9 % IV SOLN
250.0000 mg | Freq: Once | INTRAVENOUS | Status: AC
  Administered 2014-05-15: 250 mg via INTRAVENOUS
  Filled 2014-05-15: qty 20

## 2014-05-15 MED ORDER — METFORMIN HCL 500 MG PO TABS
500.0000 mg | ORAL_TABLET | Freq: Two times a day (BID) | ORAL | Status: DC
  Filled 2014-05-15 (×3): qty 1

## 2014-05-15 MED ORDER — ONDANSETRON HCL 4 MG/2ML IJ SOLN
4.0000 mg | Freq: Four times a day (QID) | INTRAMUSCULAR | Status: DC | PRN
  Administered 2014-05-15 – 2014-05-16 (×2): 4 mg via INTRAVENOUS
  Filled 2014-05-15 (×2): qty 2

## 2014-05-15 MED ORDER — ONDANSETRON HCL 4 MG PO TABS
4.0000 mg | ORAL_TABLET | Freq: Four times a day (QID) | ORAL | Status: DC | PRN
Start: 2014-05-15 — End: 2014-05-17
  Administered 2014-05-15: 4 mg via ORAL
  Filled 2014-05-15: qty 1

## 2014-05-15 MED ORDER — ACETAMINOPHEN 650 MG RE SUPP: 650.0000 mg | Freq: Four times a day (QID) | RECTAL | Status: DC | PRN

## 2014-05-15 MED ORDER — ALUM & MAG HYDROXIDE-SIMETH 200-200-20 MG/5ML PO SUSP: 30.0000 mL | Freq: Four times a day (QID) | ORAL | Status: DC | PRN

## 2014-05-15 MED ORDER — FLUTICASONE PROPIONATE HFA 110 MCG/ACT IN AERO
2.0000 | INHALATION_SPRAY | Freq: Two times a day (BID) | RESPIRATORY_TRACT | Status: DC
  Administered 2014-05-15 – 2014-05-17 (×6): 2 via RESPIRATORY_TRACT
  Filled 2014-05-15 (×2): qty 12

## 2014-05-15 MED ORDER — FLUTICASONE PROPIONATE HFA 110 MCG/ACT IN AERO
2.0000 | INHALATION_SPRAY | Freq: Two times a day (BID) | RESPIRATORY_TRACT | Status: DC
  Filled 2014-05-15: qty 12

## 2014-05-15 MED ORDER — METOPROLOL TARTRATE 50 MG PO TABS
50.0000 mg | ORAL_TABLET | Freq: Two times a day (BID) | ORAL | Status: DC
  Administered 2014-05-15: 50 mg via ORAL
  Filled 2014-05-15 (×3): qty 1

## 2014-05-15 MED ORDER — MEGESTROL ACETATE 40 MG PO TABS
120.0000 mg | ORAL_TABLET | Freq: Every day | ORAL | Status: DC
  Administered 2014-05-15 – 2014-05-16 (×2): 120 mg via ORAL
  Filled 2014-05-15 (×2): qty 3

## 2014-05-15 NOTE — H&P (Signed)
[facility] History and Physical  [name] [id number]:[id number] DOB: October 10, 1971 DOA: 05/14/2014  Referring physician: EDP PCP: Oren Binet, MD  Specialists:   Chief Complaint: Severe Vaginal Bleeding  HPI: Erin Sexton is a 43 y.o. female with a history of Asthma, HTN, and [place] who presents to the ED with complaints of recurrence of menorrhagia and crampy lower ABD pain x 6 days.   She reports soaking close to 20 pads daily.  She has had progressive weakness, fatigue and near syncope.  She had an episode of menorrhagia similar to this in 02/2014 after she was placed on Xarelto Rx for a probable PE.  She was taken off the Xarelto and the vaginal bleeding and placed on Megace Rx and the vaginal bleeding gradually resolved.   She was re-evaluated for the PE and was determined to be negative.   Her hemoglobin level decreased from 10.6 to 9.2 in the ED.       Review of Systems:  Constitutional: No Weight Loss, No Weight Gain, Night Sweats, Fevers, Chills, +Fatigue, or +Generalized Weakness HEENT: No Headaches, Difficulty Swallowing,Tooth/Dental Problems,Sore Throat,  No Sneezing, Rhinitis, Ear Ache, Nasal Congestion, or Post Nasal Drip,  Cardio-vascular:  No Chest pain, Orthopnea, PND, Edema in lower extremities, Anasarca, Dizziness, Palpitations  Resp: +Dyspnea, +DOE, No Cough, No Hemoptysis,  No Wheezing.    GI: No Heartburn, Indigestion, +Abdominal Pain, Nausea, Vomiting, Diarrhea, Change in Bowel Habits,  Loss of Appetite  GU: +Menorrhagia,  No Dysuria, Change in Color of Urine, No Urgency or Frequency.  No flank pain.  Musculoskeletal: No Joint Pain or Swelling.  No Decreased Range of Motion. No Back Pain.  Neurologic: No Syncope, No Seizures, Muscle Weakness, Paresthesia, Vision Disturbance or Loss, No Diplopia, No Vertigo, No Difficulty Walking,  Skin: No Rash or Lesions. Psych: No Change in Mood or Affect. No Depression or Anxiety. No Memory loss. No Confusion or  Hallucinations   Past Medical History  Diagnosis Date  . COPD (chronic obstructive pulmonary disease)   . Depression   . Hypertension   . Asthma   . Anxiety   . Anemia   . Pulmonary emboli   . Blood transfusion without reported diagnosis     History reviewed. No pertinent past surgical history.    Prior to Admission medications   Medication Sig Start Date End Date Taking? Authorizing Provider  albuterol (PROVENTIL HFA;VENTOLIN HFA) 108 (90 BASE) MCG/ACT inhaler Inhale 2 puffs into the lungs every 6 (six) hours as needed for wheezing or shortness of breath.    Historical Provider, MD  albuterol (PROVENTIL) (2.5 MG/3ML) 0.083% nebulizer solution Take 3 mLs (2.5 mg total) by nebulization every 6 (six) hours as needed for wheezing or shortness of breath. 03/03/14   Kathie Dike, MD  buPROPion (WELLBUTRIN XL) 150 MG 24 hr tablet Take 1 tablet (150 mg total) by mouth daily. 03/03/14   Kathie Dike, MD  ferrous sulfate 325 (65 FE) MG tablet Take 1 tablet (325 mg total) by mouth 2 (two) times daily with a meal. 03/08/14   Shanker Kristeen Mans, MD  fluticasone (FLOVENT HFA) 110 MCG/ACT inhaler Inhale 2 puffs into the lungs 2 (two) times daily. 03/03/14   Kathie Dike, MD  metFORMIN (GLUCOPHAGE) 500 MG tablet Take 1 tablet (500 mg total) by mouth 2 (two) times daily with a meal. 03/08/14   Shanker Kristeen Mans, MD  metoprolol (LOPRESSOR) 50 MG tablet Take 1 tablet (50 mg total) by mouth 2 (two) times daily. 03/08/14   Shanker Jerilynn Mages  Ghimire, MD  pravastatin (PRAVACHOL) 20 MG tablet Take 1 tablet (20 mg total) by mouth daily. 03/03/14   Kathie Dike, MD     No Known Allergies   Social History:  reports that she has never smoked. She has never used smokeless tobacco. She reports that she does not drink alcohol or use illicit drugs.     No family history on file.     Physical Exam:  GEN:  Pleasant Morbidly Obese 43 y.o. Caucasian female  examined  and in no acute distress; cooperative with  exam Filed Vitals:   05/14/14 2024 05/14/14 2027  BP:  126/77  Pulse:  74  Temp:  98.1 F (36.7 C)  TempSrc:  Oral  Resp:  18  SpO2: 98% 98%   Blood pressure 126/77, pulse 74, temperature 98.1 F (36.7 C), temperature source Oral, resp. rate 18, SpO2 98.00%. PSYCH: She is alert and oriented x4; does not appear anxious does not appear depressed; affect is normal HEENT: Normocephalic and Atraumatic, Mucous membranes pink; PERRLA; EOM intact; Fundi:  Benign;  No scleral icterus, Nares: Patent, Oropharynx: Clear, Fair Dentition, Neck:  FROM, no cervical lymphadenopathy nor thyromegaly or carotid bruit; no JVD; Breasts:: Not examined CHEST WALL: No tenderness CHEST: Normal respiration, clear to auscultation bilaterally HEART: Regular rate and rhythm; no murmurs rubs or gallops BACK: No kyphosis or scoliosis; no CVA tenderness ABDOMEN: Positive Bowel Sounds, Obese, soft non-tender;  No Rebound or Guarding, No masses, no organomegaly.   Rectal Exam: Not done EXTREMITIES: No cyanosis, clubbing or edema; no ulcerations. Genitalia: not examined PULSES: 2+ and symmetric SKIN: Normal hydration no rash or ulceration CNS:  Alert X Oriented X 4, No Focal Deficits.    Vascular: pulses palpable throughout    Labs on Admission:  Basic Metabolic Panel:  Recent Labs Lab 05/14/14 2053 05/15/14 0032  NA 142 145  K 4.1 3.7  CL 107 110  CO2 21  --   GLUCOSE 177* 130*  BUN 16 14  CREATININE 0.82 0.90  CALCIUM 8.8  --    Liver Function Tests: No results found for this basename: AST, ALT, ALKPHOS, BILITOT, PROT, ALBUMIN,  in the last 168 hours No results found for this basename: LIPASE, AMYLASE,  in the last 168 hours No results found for this basename: AMMONIA,  in the last 168 hours CBC:  Recent Labs Lab 05/14/14 2053 05/15/14 0032  WBC 11.5*  --   NEUTROABS 8.0*  --   HGB 10.6* 9.2*  HCT 33.0* 27.0*  MCV 84.2  --   PLT 311  --    Cardiac Enzymes: No results found for this  basename: CKTOTAL, CKMB, CKMBINDEX, TROPONINI,  in the last 168 hours  BNP (last 3 results)  Recent Labs  03/02/14 1333 03/07/14 0352 03/07/14 0841  PROBNP 113.7 306.1* 264.6*   CBG: No results found for this basename: GLUCAP,  in the last 168 hours  Radiological Exams on Admission: No results found.    Assessment/Plan:   43 y.o. female with  Principal Problem:   Vaginal bleeding Active Problems:   Acute blood loss anemia   Pre-syncope   Morbid obesity   Essential hypertension, benign   Asthma, chronic   HOCM (hypertrophic obstructive cardiomyopathy)     1.  Vaginal Bleeding-   DUB , Vaginal US done at Bedside,  Placed on Provera 10 mg PO q day.   Monitor H/Hs , and transfuse if needed.  GYN (Legett) to see in AM.  2.  Anemia due to acute blood Loss-   Monitor H/Hs, See #1.    3.  Pre-syncope - due to #1 and #2.     4.  HTN- Monitor BPs.    5.  Asthma -Stable.    6.  HOCM- hx , monitor on Telemetry.     7.  SCDs for DVT prophylaxis.       Code Status:      FULL CODE Family Communication:    Family at Bedside Disposition Plan:      Inpatient   Time spent:  [facility] Pager [phone]  If 7PM-7AM, please contact night-coverage [email or web address] Password [email or web address] 05/15/2014, 1:10 AM

## 2014-05-15 NOTE — Progress Notes (Signed)
Patient came up to floor. Orders were to do stroke swallow screen. ED RN asked MD if the stroke orders needed to be done. MD stated the NIHSS and stroke swallow screen did not need to be done. Will continue to monitor patient.

## 2014-05-15 NOTE — Progress Notes (Signed)
CRITICAL VALUE ALERT  Critical value received:  MRSA positive  Date of notification:  05/15/14  Time of notification:  0549  Critical value read back:yes  Nurse who received alert:  Sabino Gasser  MD notified (1st page):  n/a  Time of first page:  n/a  MD notified (2nd page):  Time of second page:  Responding MD:  n/a  Time MD responded:  N/a  Patient's RN will put in standing orders for MRSA positive

## 2014-05-15 NOTE — Progress Notes (Signed)
Patient got up to floor at 0245, and RN has had to change patient's bed pads 3 times because they were soaked in blood and blood clots. Will continue to monitor patient.

## 2014-05-15 NOTE — ED Notes (Signed)
Floor RN waiting for bariatric bed.

## 2014-05-15 NOTE — Discharge Instructions (Signed)
Abnormal Uterine Bleeding Abnormal uterine bleeding means bleeding from the vagina that is not your normal menstrual period. This can be:  Bleeding or spotting between periods.  Bleeding after sex (sexual intercourse).  Bleeding that is heavier or more than normal.  Periods that last longer than usual.  Bleeding after menopause. There are many problems that may cause this. Treatment will depend on the cause of the bleeding. Any kind of bleeding that is not normal should be reviewed by your doctor.  HOME CARE Watch your condition for any changes. These actions may lessen any discomfort you are having:  Do not use tampons or douches as told by your doctor.  Change your pads often. You should get regular pelvic exams and Pap tests. Keep all appointments for tests as told by your doctor. GET HELP IF:  You are bleeding for more than 1 week.  You feel dizzy at times. GET HELP RIGHT AWAY IF:   You pass out.  You have to change pads every 15 to 30 minutes.  You have belly pain.  You have a fever.  You become sweaty or weak.  You are passing large blood clots from the vagina.  You feel sick to your stomach (nauseous) and throw up (vomit). MAKE SURE YOU:  Understand these instructions.  Will watch your condition.  Will get help right away if you are not doing well or get worse. Document Released: 09/22/2009 Document Revised: 09/15/2013 Document Reviewed: 06/24/2013 [facility] Patient Information 2014 [place], [place].

## 2014-05-15 NOTE — Progress Notes (Signed)
Patient is still having a lot of vaginal bleeding with big blood clots. Hgb is 7.5 after 2 units of PRBC. Patient is still complaining of a lot of lower abdominal cramping. BP is 90/64, HR at 90's. Dr. Inis Sizer was made aware. To repeat hgb/hct check at 1am. Will continue to monitor patient.

## 2014-05-15 NOTE — Progress Notes (Signed)
Orthostatics done on admission to floor. See in doc flowsheets.

## 2014-05-15 NOTE — ED Notes (Signed)
Lab delay due to [place] @ beside at this time.

## 2014-05-15 NOTE — Progress Notes (Signed)
Patient ID: Erin Sexton  female  KDT:[id number]    DOB: 1971/06/22    DOA: 05/14/2014  PCP: Oren Binet, MD  Assessment/Plan: Principal Problem: Acute blood loss anemia due to  Vaginal bleeding secondary to dysfunctional uterine bleeding, unclear etiology at this time Patient was admitted for further workup, symptomatic anemia. Patient was placed on Provera and OB/GYN was consulted. Patient will receive 2 units of packed RBCs today. Transabdominal and vaginal ultrasound was performed which showed thickened endometrial stripe could be normal or some pathology.  Patient was seen by Dr. Olevia Bowens and the pelvic exam,vaginal vault with large baseball sized clot removed easily. No active bleeding from the cervix. 4 faux swabs used to clean the vault completely. Large clot felt in the lower uterine segment. Difficult to ascertain uterine size due to body habitus. nontender in bilateral adnexa.   Dr. Olevia Bowens recommended starting patient on Megace 120 mg daily for 5 days until the GYN appointment and will be tapered thereafter.  Active Problems:   Morbid obesity: Patient counseled on diet and weight control    Essential hypertension, benign - Patient was borderline hypotensive due to vaginal bleeding and symptomatic anemia. Metoprolol was placed on hold and patient was given 2 units of packed RBC transfusion and also IV fluid hydration    Pre-syncope secondary to #1, patient receiving 2 units of blood transfusion and IV fluids   DVT Prophylaxis:  Code Status:  Family Communication: Discussed with patient and family member in the room  Disposition: Hopefully today evening after the transfusions  Consultants:  GYN, Dr. Leggett/Dr. Olevia Bowens  Procedures:  Pelvic exam  Antibiotics:  None    Subjective: Patient seen and examined, very pale with vaginal bleeding. No chest pain or shortness of breath, slight dizziness on standing up  Objective: Weight change:   Intake/Output Summary (Last  24 hours) at 05/15/14 1055 Last data filed at 05/15/14 0854  Gross per 24 hour  Intake    860 ml  Output    250 ml  Net    610 ml   Blood pressure 97/52, pulse 66, temperature 97.9 F (36.6 C), temperature source Oral, resp. rate 18, height 5\' 9"  (1.753 m), weight 180.3 kg (397 lb 7.8 oz), SpO2 99.00%.  Physical Exam: General: Alert and awake, oriented x3, not in any acute distress. CVS: S1-S2 clear, no murmur rubs or gallops Chest: clear to auscultation bilaterally, no wheezing, rales or rhonchi Abdomen: soft nontender, nondistended, normal bowel sounds  Extremities: no cyanosis, clubbing or edema noted bilaterally Neuro: Cranial nerves II-XII intact, no focal neurological deficits  Lab Results: Basic Metabolic Panel:  Recent Labs Lab 05/14/14 2053 05/15/14 0032 05/15/14 0456  NA 142 145 144  K 4.1 3.7 4.4  CL 107 110 108  CO2 21  --  23  GLUCOSE 177* 130* 167*  BUN 16 14 15   CREATININE 0.82 0.90 0.83  CALCIUM 8.8  --  8.6   Liver Function Tests: No results found for this basename: AST, ALT, ALKPHOS, BILITOT, PROT, ALBUMIN,  in the last 168 hours No results found for this basename: LIPASE, AMYLASE,  in the last 168 hours No results found for this basename: AMMONIA,  in the last 168 hours CBC:  Recent Labs Lab 05/14/14 2053  05/15/14 0214 05/15/14 0456  WBC 11.5*  --   --  15.6*  NEUTROABS 8.0*  --   --   --   HGB 10.6*  < > 9.6* 8.7*  HCT 33.0*  < > 28.9*  26.8*  MCV 84.2  --   --  85.1  PLT 311  --   --  304  < > = values in this interval not displayed. Cardiac Enzymes: No results found for this basename: CKTOTAL, CKMB, CKMBINDEX, TROPONINI,  in the last 168 hours BNP: No components found with this basename: POCBNP,  CBG:  Recent Labs Lab 05/15/14 0732  GLUCAP 170*     Micro Results: Recent Results (from the past 240 hour(s))  WET PREP, GENITAL     Status: Abnormal   Collection Time    05/14/14  9:42 PM      Result Value Ref Range Status    Yeast Wet Prep HPF POC NONE SEEN  NONE SEEN Final   Trich, Wet Prep NONE SEEN  NONE SEEN Final   Clue Cells Wet Prep HPF POC RARE (*) NONE SEEN Final   WBC, Wet Prep HPF POC RARE (*) NONE SEEN Final  MRSA PCR SCREENING     Status: Abnormal   Collection Time    05/15/14  3:13 AM      Result Value Ref Range Status   MRSA by PCR POSITIVE (*) NEGATIVE Final   Comment: RESULT CALLED TO, READ BACK BY AND VERIFIED WITH:     BBRALLEY RN AT 0545 ON [id number] BY DLONG                The GeneXpert MRSA Assay (FDA     approved for NASAL specimens     only), is one component of a     comprehensive MRSA colonization     surveillance program. It is not     intended to diagnose MRSA     infection nor to guide or     monitor treatment for     MRSA infections.    Studies/Results: US Transvaginal Non-ob  05/15/2014   CLINICAL DATA:  Vaginal bleeding since March 2015.  EXAM: TRANSABDOMINAL AND TRANSVAGINAL ULTRASOUND OF PELVIS  TECHNIQUE: Both transabdominal and transvaginal ultrasound examinations of the pelvis were performed. Transabdominal technique was performed for global imaging of the pelvis including uterus, ovaries, adnexal regions, and pelvic cul-de-sac. It was necessary to proceed with endovaginal exam following the transabdominal exam to visualize the endometrium and ovaries.  COMPARISON:  03/06/2014  FINDINGS: Examination is somewhat limited due to patient body habitus.  Uterus  Measurements: 6.9 x 7.8 x 12.0 cm. No fibroids or other mass visualized.  Endometrium  Thickness: 27 mm (previously measured 15 mm). Thickened a moderately heterogeneous likely due in part to hemorrhagic debris in this patient with moderate vaginal bleeding. Could not exclude an endometrial mass/polyp or hyperplasia.  Right ovary  Not visualized.  Left ovary  Not visualized.  Other findings  There is a large heterogeneous somewhat hyperechoic mass centered over the cervix measuring 6 x 6 x 6.9 cm.  IMPRESSION: Slightly  enlarged uterus with moderately thickened heterogeneous endometrium measuring 27 mm which has increased in thickness compared to the prior exam. This is likely due in part to hemorrhagic debris in this patient with moderate vaginal bleeding, although cannot exclude a mass. New large heterogeneous echogenic masslike area centered over the cervix measuring 6 x 6 x 6.9 cm likely a large region of clot. Recommend GYN consultation.  If bleeding remains unresponsive to hormonal or medical therapy, focal lesion work-up with sonohysterogram should be considered. Endometrial biopsy should also be considered in pre-menopausal patients at high risk for endometrial carcinoma. (Ref: Radiological Reasoning: Algorithmic Workup of Abnormal Vaginal  Bleeding with Endovaginal Sonography and Sonohysterography. AJR 2008; 791:T05-69)  Nonvisualization of the ovaries.   Electronically Signed   By: Marin Olp M.D.   On: 05/15/2014 07:19   US Pelvis Complete  05/15/2014   CLINICAL DATA:  Vaginal bleeding since March 2015.  EXAM: TRANSABDOMINAL AND TRANSVAGINAL ULTRASOUND OF PELVIS  TECHNIQUE: Both transabdominal and transvaginal ultrasound examinations of the pelvis were performed. Transabdominal technique was performed for global imaging of the pelvis including uterus, ovaries, adnexal regions, and pelvic cul-de-sac. It was necessary to proceed with endovaginal exam following the transabdominal exam to visualize the endometrium and ovaries.  COMPARISON:  03/06/2014  FINDINGS: Examination is somewhat limited due to patient body habitus.  Uterus  Measurements: 6.9 x 7.8 x 12.0 cm. No fibroids or other mass visualized.  Endometrium  Thickness: 27 mm (previously measured 15 mm). Thickened a moderately heterogeneous likely due in part to hemorrhagic debris in this patient with moderate vaginal bleeding. Could not exclude an endometrial mass/polyp or hyperplasia.  Right ovary  Not visualized.  Left ovary  Not visualized.  Other findings   There is a large heterogeneous somewhat hyperechoic mass centered over the cervix measuring 6 x 6 x 6.9 cm.  IMPRESSION: Slightly enlarged uterus with moderately thickened heterogeneous endometrium measuring 27 mm which has increased in thickness compared to the prior exam. This is likely due in part to hemorrhagic debris in this patient with moderate vaginal bleeding, although cannot exclude a mass. New large heterogeneous echogenic masslike area centered over the cervix measuring 6 x 6 x 6.9 cm likely a large region of clot. Recommend GYN consultation.  If bleeding remains unresponsive to hormonal or medical therapy, focal lesion work-up with sonohysterogram should be considered. Endometrial biopsy should also be considered in pre-menopausal patients at high risk for endometrial carcinoma. (Ref: Radiological Reasoning: Algorithmic Workup of Abnormal Vaginal Bleeding with Endovaginal Sonography and Sonohysterography. AJR 2008; 794:I01-65)  Nonvisualization of the ovaries.   Electronically Signed   By: Marin Olp M.D.   On: 05/15/2014 07:19    Medications: Scheduled Meds: . buPROPion  150 mg Oral Daily  . Chlorhexidine Gluconate Cloth  6 each Topical Q0600  . ferrous sulfate  325 mg Oral BID WC  . fluticasone  2 puff Inhalation BID  . megestrol  120 mg Oral Daily  . mupirocin ointment  1 application Nasal BID  . simvastatin  10 mg Oral q1800  . sodium chloride  3 mL Intravenous Q12H      LOS: 1 day   Allenmichael Mcpartlin Krystal Eaton M.D. [facility] 05/15/2014, 10:55 AM Pager: [phone]  If 7PM-7AM, please contact night-coverage [email or web address] Password [email or web address]  **Disclaimer: This note was dictated with voice recognition software. Similar sounding words can inadvertently be transcribed and this note may contain transcription errors which may not have been corrected upon publication of note.**

## 2014-05-15 NOTE — ED Notes (Signed)
US at bedside

## 2014-05-15 NOTE — ED Notes (Signed)
Pt family called out because pt. Became pale and lightheaded. Dr. Was pulled into room. Pt appears diaphoretic and pale and c/o nausea. Pt BP 130s/70s, HR 110. 2nd IV placed.

## 2014-05-15 NOTE — Consult Note (Signed)
Reason for Consult: dysfunctional Uterine bleeding Referring Physician: Dr. Hilbert Odor is an 43 y.o. female with a pmhx of asthma, HTN and HOCM who presented to the ED with severe vaginal bleeding and cramping x 6 days.  States has soaked up to 20 pads daily.  2 episodes of near syncope at home.  Prior to this her cycles were normal. Regular every 28-30 days and lasting 6 days with her heaviest day soaking through 4 pads in that day.  02/2014 she was on xarelto for a presumptive blood clot and had a similar episode of menorrhagia for which she was hospitalized and placed on megace and transfused 4U PRBCs. Her xarelto was stopped and she seemed to get better.  In May, her cycle was normal in flow but lasted 3 weeks instead of the normal 6 days.  Then this month she started bleeding "floodgates" and has persistently bled heavily all week long.    Since admission she has been monitored on telemetry and her hgb has dropped from 10.6 to 8.7.  She has had pads changed regularly that per nursing have significant bleeding and clots in them.  She does feel light headed when she stands too quickly.  She is not tachycardic although she is on a beta blocker.  Her most recent BP was slightly hypotensive.  Pt overall feels bleeding has maybe slightly improved since admission although is unsure.     Pertinent Gynecological History: Menses: flow is moderate and usually lasting 5 to 7 days Bleeding: normal until last 3 months Contraception: none Blood transfusions: 4U in March of this year Previous GYN Procedures: none  OB History: G0, P0   Menstrual History:  No LMP recorded.    Past Medical History  Diagnosis Date  . COPD (chronic obstructive pulmonary disease)   . Depression   . Hypertension   . Asthma   . Anxiety   . Anemia   . Pulmonary emboli   . Blood transfusion without reported diagnosis   . Diabetes mellitus without complication     pre    History reviewed. No pertinent past  surgical history.  History reviewed. No pertinent family history.  Social History:  reports that she has never smoked. She has never used smokeless tobacco. She reports that she does not drink alcohol or use illicit drugs.  Allergies: No Known Allergies  Medications: I have reviewed the patient's current medications.  Review of Systems  Constitutional: Positive for malaise/fatigue. Negative for fever, chills and weight loss.  HENT: Negative for congestion.   Eyes: Negative for blurred vision.  Respiratory: Negative for cough, hemoptysis, sputum production and shortness of breath.   Cardiovascular: Negative for chest pain, palpitations and orthopnea.  Gastrointestinal: Negative for heartburn, nausea and vomiting.  Genitourinary: Negative for dysuria and urgency.  Musculoskeletal: Negative for myalgias.  Skin: Negative for rash.  Neurological: Positive for dizziness and weakness. Negative for tingling and headaches.  Psychiatric/Behavioral: Negative for depression.    Blood pressure 97/52, pulse 66, temperature 97.9 F (36.6 C), temperature source Oral, resp. rate 18, height _0  (1.753 m), weight 180.3 kg (397 lb 7.8 oz), SpO2 99.00%. Physical Exam  Constitutional: She is oriented to person, place, and time.  Obese female in NAD  HENT:  Mouth/Throat: Oropharynx is clear and moist.  Eyes:  Conjunctival pallor  Neck: Neck supple.  Cardiovascular: Normal rate, regular rhythm and normal heart sounds.   Respiratory: Effort normal and breath sounds normal.  GI: Soft. Bowel sounds are normal.  There is tenderness (to deep palpation in bilateral lower quadrants and suparpubically but mild). There is no rebound and no guarding.  Genitourinary:  NEFG, vaginal vault with large baseball sized clot removed easily. No active bleeding from the cervix. 4 faux swabs used to clean the vault completely. Large clot felt in the lower uterine segment. Difficult to ascertain uterine size due to body  habitus. nontender in bilateral adnexa.   Neurological: She is alert and oriented to person, place, and time.  Skin: Skin is warm and dry. There is pallor.  Psychiatric: She has a normal mood and affect.    Results for orders placed during the hospital encounter of 05/14/14 (from the past 48 hour(s))  CBC WITH DIFFERENTIAL     Status: Abnormal   Collection Time    05/14/14  8:53 PM      Result Value Ref Range   WBC 11.5 (*) 4.0 - 10.5 K/uL   RBC 3.92  3.87 - 5.11 MIL/uL   Hemoglobin 10.6 (*) 12.0 - 15.0 g/dL   HCT 33.0 (*) 36.0 - 46.0 %   MCV 84.2  78.0 - 100.0 fL   MCH 27.0  26.0 - 34.0 pg   MCHC 32.1  30.0 - 36.0 g/dL   RDW 13.4  11.5 - 15.5 %   Platelets 311  150 - 400 K/uL   Neutrophils Relative % 70  43 - 77 %   Neutro Abs 8.0 (*) 1.7 - 7.7 K/uL   Lymphocytes Relative 23  12 - 46 %   Lymphs Abs 2.6  0.7 - 4.0 K/uL   Monocytes Relative 4  3 - 12 %   Monocytes Absolute 0.5  0.1 - 1.0 K/uL   Eosinophils Relative 3  0 - 5 %   Eosinophils Absolute 0.3  0.0 - 0.7 K/uL   Basophils Relative 0  0 - 1 %   Basophils Absolute 0.1  0.0 - 0.1 K/uL  BASIC METABOLIC PANEL     Status: Abnormal   Collection Time    05/14/14  8:53 PM      Result Value Ref Range   Sodium 142  137 - 147 mEq/L   Potassium 4.1  3.7 - 5.3 mEq/L   Chloride 107  96 - 112 mEq/L   CO2 21  19 - 32 mEq/L   Glucose, Bld 177 (*) 70 - 99 mg/dL   BUN 16  6 - 23 mg/dL   Creatinine, Ser 0.82  0.50 - 1.10 mg/dL   Calcium 8.8  8.4 - 10.5 mg/dL   GFR calc non Af Amer 85 (*) >90 mL/min   GFR calc Af Amer >90  >90 mL/min   Comment: (NOTE)     The eGFR has been calculated using the CKD EPI equation.     This calculation has not been validated in all clinical situations.     eGFR's persistently <90 mL/min signify possible Chronic Kidney     Disease.  TYPE AND SCREEN     Status: None   Collection Time    05/14/14  8:53 PM      Result Value Ref Range   ABO/RH(D) O POS     Antibody Screen NEG     Sample Expiration  05/17/2014     Unit Number [id number]     Blood Component Type RBC LR PHER1     Unit division 00     Status of Unit ALLOCATED     Transfusion Status OK TO TRANSFUSE  Crossmatch Result Compatible    ABO/RH     Status: None   Collection Time    05/14/14  8:53 PM      Result Value Ref Range   ABO/RH(D) O POS    WET PREP, GENITAL     Status: Abnormal   Collection Time    05/14/14  9:42 PM      Result Value Ref Range   Yeast Wet Prep HPF POC NONE SEEN  NONE SEEN   Trich, Wet Prep NONE SEEN  NONE SEEN   Clue Cells Wet Prep HPF POC RARE (*) NONE SEEN   WBC, Wet Prep HPF POC RARE (*) NONE SEEN  URINALYSIS, ROUTINE W REFLEX MICROSCOPIC     Status: Abnormal   Collection Time    05/14/14 11:01 PM      Result Value Ref Range   Color, Urine YELLOW  YELLOW   APPearance CLOUDY (*) CLEAR   Specific Gravity, Urine 1.027  1.005 - 1.030   pH 5.0  5.0 - 8.0   Glucose, UA NEGATIVE  NEGATIVE mg/dL   Hgb urine dipstick LARGE (*) NEGATIVE   Bilirubin Urine NEGATIVE  NEGATIVE   Ketones, ur NEGATIVE  NEGATIVE mg/dL   Protein, ur 100 (*) NEGATIVE mg/dL   Urobilinogen, UA 0.2  0.0 - 1.0 mg/dL   Nitrite NEGATIVE  NEGATIVE   Leukocytes, UA NEGATIVE  NEGATIVE  URINE MICROSCOPIC-ADD ON     Status: None   Collection Time    05/14/14 11:01 PM      Result Value Ref Range   RBC / HPF TOO NUMEROUS TO COUNT  <3 RBC/hpf  URINE RAPID DRUG SCREEN (HOSP PERFORMED)     Status: None   Collection Time    05/14/14 11:01 PM      Result Value Ref Range   Opiates NONE DETECTED  NONE DETECTED   Cocaine NONE DETECTED  NONE DETECTED   Benzodiazepines NONE DETECTED  NONE DETECTED   Amphetamines NONE DETECTED  NONE DETECTED   Tetrahydrocannabinol NONE DETECTED  NONE DETECTED   Barbiturates NONE DETECTED  NONE DETECTED   Comment:            DRUG SCREEN FOR MEDICAL PURPOSES     ONLY.  IF CONFIRMATION IS NEEDED     FOR ANY PURPOSE, NOTIFY LAB     WITHIN 5 DAYS.                LOWEST DETECTABLE LIMITS     FOR  URINE DRUG SCREEN     Drug Class       Cutoff (ng/mL)     Amphetamine      1000     Barbiturate      200     Benzodiazepine   982     Tricyclics       641     Opiates          300     Cocaine          300     THC              50  POC URINE PREG, ED     Status: None   Collection Time    05/14/14 11:10 PM      Result Value Ref Range   Preg Test, Ur NEGATIVE  NEGATIVE   Comment:            THE SENSITIVITY OF THIS     METHODOLOGY IS >24  mIU/mL  I-STAT CHEM 8, ED     Status: Abnormal   Collection Time    05/15/14 12:32 AM      Result Value Ref Range   Sodium 145  137 - 147 mEq/L   Potassium 3.7  3.7 - 5.3 mEq/L   Chloride 110  96 - 112 mEq/L   BUN 14  6 - 23 mg/dL   Creatinine, Ser 0.90  0.50 - 1.10 mg/dL   Glucose, Bld 130 (*) 70 - 99 mg/dL   Calcium, Ion 1.16  1.12 - 1.23 mmol/L   TCO2 21  0 - 100 mmol/L   Hemoglobin 9.2 (*) 12.0 - 15.0 g/dL   HCT 27.0 (*) 36.0 - 46.0 %  HEMOGLOBIN AND HEMATOCRIT, BLOOD     Status: Abnormal   Collection Time    05/15/14  2:14 AM      Result Value Ref Range   Hemoglobin 9.6 (*) 12.0 - 15.0 g/dL   HCT 28.9 (*) 36.0 - 46.0 %  MRSA PCR SCREENING     Status: Abnormal   Collection Time    05/15/14  3:13 AM      Result Value Ref Range   MRSA by PCR POSITIVE (*) NEGATIVE   Comment: RESULT CALLED TO, READ BACK BY AND VERIFIED WITH:     BBRALLEY RN AT [id number] ON [id number] BY DLONG                The GeneXpert MRSA Assay (FDA     approved for NASAL specimens     only), is one component of a     comprehensive MRSA colonization     surveillance program. It is not     intended to diagnose MRSA     infection nor to guide or     monitor treatment for     MRSA infections.  BASIC METABOLIC PANEL     Status: Abnormal   Collection Time    05/15/14  4:56 AM      Result Value Ref Range   Sodium 144  137 - 147 mEq/L   Potassium 4.4  3.7 - 5.3 mEq/L   Chloride 108  96 - 112 mEq/L   CO2 23  19 - 32 mEq/L   Glucose, Bld 167 (*) 70 - 99 mg/dL   BUN 15  6  - 23 mg/dL   Creatinine, Ser 0.83  0.50 - 1.10 mg/dL   Calcium 8.6  8.4 - 10.5 mg/dL   GFR calc non Af Amer 84 (*) >90 mL/min   GFR calc Af Amer >90  >90 mL/min   Comment: (NOTE)     The eGFR has been calculated using the CKD EPI equation.     This calculation has not been validated in all clinical situations.     eGFR's persistently <90 mL/min signify possible Chronic Kidney     Disease.  CBC     Status: Abnormal   Collection Time    05/15/14  4:56 AM      Result Value Ref Range   WBC 15.6 (*) 4.0 - 10.5 K/uL   RBC 3.15 (*) 3.87 - 5.11 MIL/uL   Hemoglobin 8.7 (*) 12.0 - 15.0 g/dL   HCT 26.8 (*) 36.0 - 46.0 %   MCV 85.1  78.0 - 100.0 fL   MCH 27.6  26.0 - 34.0 pg   MCHC 32.5  30.0 - 36.0 g/dL   RDW 13.5  11.5 - 15.5 %   Platelets 304  150 -  400 K/uL  RETICULOCYTES     Status: Abnormal   Collection Time    05/15/14  7:25 AM      Result Value Ref Range   Retic Ct Pct 1.0  0.4 - 3.1 %   RBC. 2.94 (*) 3.87 - 5.11 MIL/uL   Retic Count, Manual 29.4  19.0 - 186.0 K/uL  PREPARE RBC (CROSSMATCH)     Status: None   Collection Time    05/15/14  7:30 AM      Result Value Ref Range   Order Confirmation ORDER PROCESSED BY BLOOD BANK    GLUCOSE, CAPILLARY     Status: Abnormal   Collection Time    05/15/14  7:32 AM      Result Value Ref Range   Glucose-Capillary 170 (*) 70 - 99 mg/dL    US Transvaginal Non-ob  05/15/2014   CLINICAL DATA:  Vaginal bleeding since March 2015.  EXAM: TRANSABDOMINAL AND TRANSVAGINAL ULTRASOUND OF PELVIS  TECHNIQUE: Both transabdominal and transvaginal ultrasound examinations of the pelvis were performed. Transabdominal technique was performed for global imaging of the pelvis including uterus, ovaries, adnexal regions, and pelvic cul-de-sac. It was necessary to proceed with endovaginal exam following the transabdominal exam to visualize the endometrium and ovaries.  COMPARISON:  03/06/2014  FINDINGS: Examination is somewhat limited due to patient body habitus.   Uterus  Measurements: 6.9 x 7.8 x 12.0 cm. No fibroids or other mass visualized.  Endometrium  Thickness: 27 mm (previously measured 15 mm). Thickened a moderately heterogeneous likely due in part to hemorrhagic debris in this patient with moderate vaginal bleeding. Could not exclude an endometrial mass/polyp or hyperplasia.  Right ovary  Not visualized.  Left ovary  Not visualized.  Other findings  There is a large heterogeneous somewhat hyperechoic mass centered over the cervix measuring 6 x 6 x 6.9 cm.  IMPRESSION: Slightly enlarged uterus with moderately thickened heterogeneous endometrium measuring 27 mm which has increased in thickness compared to the prior exam. This is likely due in part to hemorrhagic debris in this patient with moderate vaginal bleeding, although cannot exclude a mass. New large heterogeneous echogenic masslike area centered over the cervix measuring 6 x 6 x 6.9 cm likely a large region of clot. Recommend GYN consultation.  If bleeding remains unresponsive to hormonal or medical therapy, focal lesion work-up with sonohysterogram should be considered. Endometrial biopsy should also be considered in pre-menopausal patients at high risk for endometrial carcinoma. (Ref: Radiological Reasoning: Algorithmic Workup of Abnormal Vaginal Bleeding with Endovaginal Sonography and Sonohysterography. AJR 2008; 004:H99-77)  Nonvisualization of the ovaries.   Electronically Signed   By: Marin Olp M.D.   On: 05/15/2014 07:19   US Pelvis Complete  05/15/2014   CLINICAL DATA:  Vaginal bleeding since March 2015.  EXAM: TRANSABDOMINAL AND TRANSVAGINAL ULTRASOUND OF PELVIS  TECHNIQUE: Both transabdominal and transvaginal ultrasound examinations of the pelvis were performed. Transabdominal technique was performed for global imaging of the pelvis including uterus, ovaries, adnexal regions, and pelvic cul-de-sac. It was necessary to proceed with endovaginal exam following the transabdominal exam to visualize  the endometrium and ovaries.  COMPARISON:  03/06/2014  FINDINGS: Examination is somewhat limited due to patient body habitus.  Uterus  Measurements: 6.9 x 7.8 x 12.0 cm. No fibroids or other mass visualized.  Endometrium  Thickness: 27 mm (previously measured 15 mm). Thickened a moderately heterogeneous likely due in part to hemorrhagic debris in this patient with moderate vaginal bleeding. Could not exclude an endometrial mass/polyp or  hyperplasia.  Right ovary  Not visualized.  Left ovary  Not visualized.  Other findings  There is a large heterogeneous somewhat hyperechoic mass centered over the cervix measuring 6 x 6 x 6.9 cm.  IMPRESSION: Slightly enlarged uterus with moderately thickened heterogeneous endometrium measuring 27 mm which has increased in thickness compared to the prior exam. This is likely due in part to hemorrhagic debris in this patient with moderate vaginal bleeding, although cannot exclude a mass. New large heterogeneous echogenic masslike area centered over the cervix measuring 6 x 6 x 6.9 cm likely a large region of clot. Recommend GYN consultation.  If bleeding remains unresponsive to hormonal or medical therapy, focal lesion work-up with sonohysterogram should be considered. Endometrial biopsy should also be considered in pre-menopausal patients at high risk for endometrial carcinoma. (Ref: Radiological Reasoning: Algorithmic Workup of Abnormal Vaginal Bleeding with Endovaginal Sonography and Sonohysterography. AJR 2008; 801:K55-37)  Nonvisualization of the ovaries.   Electronically Signed   By: Marin Olp M.D.   On: 05/15/2014 07:19    Assessment/Plan:  43 yo G0 with a pmhx of HOCM, asthma and HTN who is admitted with dysfunctional uterine bleeding and symptomatic anemia.   1) dysfunctional uterine bleeding - unclear etiology at this time. [place] with thickened endometrial stripe which could be normal but could also represent some pathology.  - please d/c provera 36m and would  recommend starting on megace again for bleeding control 1232mdaily for first 5 days and then titrate down from there  - pt to likely need endometrial biopsy and possible IUD placement as outpatient for long term management.   2) symptomatic anemia - While hgb 8.7 this AM, pt with significant pallor, pre-syncopal symptoms and now hypotension.  Not tachycardic but also on a betblocker (last dose 4am) likely masking any rebound tacycardia.  - would recommend transfusing 2U PRBCs  For symptom relief.   3) would recommend d/c to home later today if remains stable after transfusion.  We will be happy to see her in the office Tuesday [facility] [place] to the pt as well.  She may continue on Megace 12029mntil her appt time.    Thank you for the consult. Please call [phone] any questions you might have.    Tamsen Reist L Alson Mcpheeters 05/15/2014

## 2014-05-16 ENCOUNTER — Ambulatory Visit: Payer: Medicare Other | Admitting: Obstetrics & Gynecology

## 2014-05-16 DIAGNOSIS — R55 Syncope and collapse: Secondary | ICD-10-CM

## 2014-05-16 DIAGNOSIS — J45909 Unspecified asthma, uncomplicated: Secondary | ICD-10-CM

## 2014-05-16 DIAGNOSIS — D62 Acute posthemorrhagic anemia: Secondary | ICD-10-CM

## 2014-05-16 DIAGNOSIS — N898 Other specified noninflammatory disorders of vagina: Secondary | ICD-10-CM

## 2014-05-16 DIAGNOSIS — D649 Anemia, unspecified: Secondary | ICD-10-CM

## 2014-05-16 LAB — HEMOGLOBIN AND HEMATOCRIT, BLOOD
HCT: 19.2 % — ABNORMAL LOW (ref 36.0–46.0)
HEMOGLOBIN: 6.4 g/dL — AB (ref 12.0–15.0)

## 2014-05-16 LAB — CBC
HCT: 21.9 % — ABNORMAL LOW (ref 36.0–46.0)
HEMATOCRIT: 21.6 % — AB (ref 36.0–46.0)
HEMOGLOBIN: 7.5 g/dL — AB (ref 12.0–15.0)
HEMOGLOBIN: 7.2 g/dL — AB (ref 12.0–15.0)
MCH: 27.5 pg (ref 26.0–34.0)
MCH: 28.5 pg (ref 26.0–34.0)
MCHC: 33.3 g/dL (ref 30.0–36.0)
MCHC: 34.2 g/dL (ref 30.0–36.0)
MCV: 82.4 fL (ref 78.0–100.0)
MCV: 83.3 fL (ref 78.0–100.0)
Platelets: 238 10*3/uL (ref 150–400)
Platelets: 250 10*3/uL (ref 150–400)
RBC: 2.62 MIL/uL — AB (ref 3.87–5.11)
RBC: 2.63 MIL/uL — ABNORMAL LOW (ref 3.87–5.11)
RDW: 13.8 % (ref 11.5–15.5)
RDW: 13.9 % (ref 11.5–15.5)
WBC: 16.8 10*3/uL — AB (ref 4.0–10.5)
WBC: 11.8 10*3/uL — AB (ref 4.0–10.5)

## 2014-05-16 LAB — BASIC METABOLIC PANEL
BUN: 19 mg/dL (ref 6–23)
CHLORIDE: 108 meq/L (ref 96–112)
CO2: 22 meq/L (ref 19–32)
Calcium: 7.9 mg/dL — ABNORMAL LOW (ref 8.4–10.5)
Creatinine, Ser: 0.91 mg/dL (ref 0.50–1.10)
GFR calc non Af Amer: 75 mL/min — ABNORMAL LOW (ref >90)
GFR, EST AFRICAN AMERICAN: 87 mL/min — AB (ref >90)
Glucose, Bld: 180 mg/dL — ABNORMAL HIGH (ref 70–99)
Potassium: 4.1 mEq/L (ref 3.7–5.3)
Sodium: 141 mEq/L (ref 137–147)

## 2014-05-16 LAB — PREPARE RBC (CROSSMATCH)

## 2014-05-16 LAB — GLUCOSE, CAPILLARY: GLUCOSE-CAPILLARY: 193 mg/dL — AB (ref 70–99)

## 2014-05-16 LAB — ABO/RH: ABO/RH(D): O POS

## 2014-05-16 MED ORDER — ACETAMINOPHEN 325 MG PO TABS
650.0000 mg | ORAL_TABLET | Freq: Once | ORAL | Status: AC
  Administered 2014-05-16: 650 mg via ORAL
  Filled 2014-05-16: qty 2

## 2014-05-16 MED ORDER — MEGESTROL ACETATE 40 MG PO TABS
160.0000 mg | ORAL_TABLET | Freq: Every day | ORAL | Status: DC
  Administered 2014-05-16 – 2014-05-17 (×2): 160 mg via ORAL
  Filled 2014-05-16 (×3): qty 4

## 2014-05-16 MED ORDER — ENOXAPARIN SODIUM 40 MG/0.4ML ~~LOC~~ SOLN
40.0000 mg | SUBCUTANEOUS | Status: DC
  Administered 2014-05-17: 40 mg via SUBCUTANEOUS
  Filled 2014-05-16 (×2): qty 0.4

## 2014-05-16 MED ORDER — VITAMINS A & D EX OINT
TOPICAL_OINTMENT | CUTANEOUS | Status: AC
  Administered 2014-05-16: 07:00:00
  Filled 2014-05-16: qty 5

## 2014-05-16 MED ORDER — METOPROLOL TARTRATE 50 MG PO TABS
50.0000 mg | ORAL_TABLET | Freq: Two times a day (BID) | ORAL | Status: DC
  Administered 2014-05-16 – 2014-05-17 (×2): 50 mg via ORAL
  Filled 2014-05-16 (×4): qty 1

## 2014-05-16 MED ORDER — ESTROGENS CONJUGATED 25 MG IJ SOLR
25.0000 mg | Freq: Once | INTRAMUSCULAR | Status: AC
  Administered 2014-05-16: 25 mg via INTRAVENOUS
  Filled 2014-05-16: qty 25

## 2014-05-16 MED ORDER — DIPHENHYDRAMINE HCL 25 MG PO CAPS
25.0000 mg | ORAL_CAPSULE | Freq: Once | ORAL | Status: AC
  Administered 2014-05-16: 25 mg via ORAL
  Filled 2014-05-16: qty 1

## 2014-05-16 NOTE — Progress Notes (Signed)
Patient is still having heavy vaginal bleeding with big blod clots. Changed her pad saturated with blood 4x throughout the night.. Patient is very weak and pale, dizzy when she tried to sit up. Hgb this morning is 6.4. Dr. Inis Sizer was made aware. To transfuse blood this morning.

## 2014-05-16 NOTE — Discharge Summary (Signed)
TRANSFER SUMMARY   Erin Sexton NKN:[id number]:[id number]  is a 43 y.o. female  Outpatient Primary MD for the patient is Erin Binet, MD Admission date: 05/14/2014 Transfer Date 05/16/2014 Admitting Physician Erin Millard, MD   Place of Transfer:  [facility] Accepting MD:  Erin Sexton Mode : Care link Condition: Guarded  Admission Diagnosis  Essential hypertension, benign [401.1] Vaginal bleeding [623.8] HOCM (hypertrophic obstructive cardiomyopathy) [425.11] Pre-syncope [780.2] Asthma, chronic [493.90]  Discharge/transfer Diagnosis    Vaginal bleeding [623.8] secondary to dysfunctional uterine bleeding Acute blood loss anemia due to vaginal bleeding Borderline Hypotension Presyncope due to anemia   Past Medical History  Diagnosis Date  . COPD (chronic obstructive pulmonary disease)   . Depression   . Hypertension   . Asthma   . Anxiety   . Anemia   . Pulmonary emboli   . Blood transfusion without reported diagnosis   . Diabetes mellitus without complication     pre    History reviewed. No pertinent past surgical history.  Consults  OB/GYN, Erin Sexton, Erin Sexton  Significant Tests   Pelvic ultrasound, transvaginal ultrasound 05/15/14  IMPRESSION:  Slightly enlarged uterus with moderately thickened heterogeneous  endometrium measuring 27 mm which has increased in thickness  compared to the prior exam. This is likely due in part to  hemorrhagic debris in this patient with moderate vaginal bleeding,  although cannot exclude a mass. New large heterogeneous echogenic  masslike area centered over the cervix measuring 6 x 6 x 6.9 cm  likely a large region of clot. Recommend GYN consultation.  If bleeding remains unresponsive to hormonal or medical therapy,  focal lesion work-up with sonohysterogram should be considered.  Endometrial biopsy should also be considered in pre-menopausal   patients at high risk for endometrial carcinoma. (Ref: Radiological  Reasoning: Algorithmic Workup of Abnormal Vaginal Bleeding with  Endovaginal Sonography and Sonohysterography. AJR 2008; 299:M42-68)  Nonvisualization of the ovaries.    Hospital Course See H&P for all details in brief,  Patient is a 43 year old female with history of asthma, hypertension,and HCOM who presents to the ED with complaints of recurrence of menorrhagia and crampy lower ABD pain x 6 days. She reports soaking close to 20 pads daily. She has had progressive weakness, fatigue and near syncope. She had an episode of menorrhagia similar to this in 02/2014 after she was placed on Xarelto Rx for a probable PE. She was taken off the Xarelto due to vaginal bleeding and placed on Megace Rx and the vaginal bleeding gradually resolved. She was re-evaluated for the PE and was determined to be negative. Her hemoglobin level decreased from 10.6 to 9.2 in the ED. patient was admitted for further workup.  Hospital course:  Acute blood loss anemia due to Vaginal bleeding secondary to dysfunctional uterine bleeding, unclear etiology at this time  Patient was admitted for further workup of symptomatic anemia. Patient was placed on Provera and OB/GYN was consulted. Patient received 2 units of packed RBCs on 6/7 and IV iron. Transabdominal and vaginal ultrasound was performed which showed thickened endometrial stripe,  New large heterogeneous echogenic masslike area centered over the cervix measuring 6 x 6 x 6.9 cm likely a large region of clot. Patient was seen by Erin. Olevia Sexton and the pelvic exam was done which showedvaginal vault with large baseball sized clot removed easily. No active bleeding from the cervix. 4 faux swabs used to clean the vault completely. Large clot felt in the lower uterine segment. Difficult to ascertain uterine size  due to body habitus. nontender in bilateral adnexa.  Erin. Olevia Sexton recommended starting patient on Megace 120 mg  daily for 5 days until the GYN appointment and will be tapered thereafter.  Patient however continued to have worsened vaginal bleeding overnight despite 2 units of packed RBC transfusion yesterday, hemoglobin went down to 6.4. Patient is receiving 2 more units of packed RBCs transfusion today. Discussed in detail this morning with Erin. Elly Sexton (GYN) [facility].  Morbid obesity: Patient counseled on diet and weight control   Essential hypertension, benign: currently borderline BP 118/54 - Patient was borderline hypotensive due to vaginal bleeding and symptomatic anemia. Metoprolol has been placed on hold and patient was given 2 units of packed RBC transfusion and also IV fluid hydration   Pre-syncope secondary to #1: still very dizzy, patient receiving another 2 units of blood transfusion and IV fluids      Today   [name] seen and examined today, very pale and dizzy, still having heavy vaginal bleeding with the blood clots, hemoglobin down to 6.4.  Objective:   Blood pressure 118/54, pulse 107, temperature 97.7 F (36.5 C), temperature source Axillary, resp. rate 24, height 5\' 9"  (1.753 m), weight 180.3 kg (397 lb 7.8 oz), SpO2 100.00%.  Intake/Output Summary (Last 24 hours) at 05/16/14 1016 Last data filed at 05/16/14 1004  Gross per 24 hour  Intake   3580 ml  Output   1100 ml  Net   2480 ml    Exam Awake Alert, Oriented x3, No new F.N deficits, Normal affect, pale  HEENT:EOMI, PERLA Neck: Supple,no JVD, No cervical lymphadenopathy appreciated.  Chest: clear to ausculatation, no wheezing, rales and rhonchi CVS: RRR,No murmurs, rubs or gallops Abdomen: Normal bowel sounds, Soft, Non tender, No organomegaly, No rebound or guarding Extremeties: No Cyanosis, Clubbing or edema, No new Rash or bruise  Data Review  CBC w Diff: Lab Results  Component Value Date   WBC 11.8* 05/16/2014   HGB 6.4* 05/16/2014    HCT 19.2* 05/16/2014   PLT 250 05/16/2014   LYMPHOPCT 23 05/14/2014   MONOPCT 4 05/14/2014   EOSPCT 3 05/14/2014   BASOPCT 0 05/14/2014   CMP: Lab Results  Component Value Date   NA 141 05/16/2014   K 4.1 05/16/2014   CL 108 05/16/2014   CO2 22 05/16/2014   BUN 19 05/16/2014   CREATININE 0.91 05/16/2014   PROT 6.0 03/07/2014   ALBUMIN 2.9* 03/07/2014   BILITOT 0.4 03/07/2014   ALKPHOS 63 03/07/2014   AST 15 03/07/2014   ALT 15 03/07/2014  .  Scheduled Meds: . buPROPion  150 mg Oral Daily  . Chlorhexidine Gluconate Cloth  6 each Topical Q0600  . ferrous sulfate  325 mg Oral BID WC  . fluticasone  2 puff Inhalation BID  . megestrol  120 mg Oral Daily  . mupirocin ointment  1 application Nasal BID  . simvastatin  10 mg Oral q1800  . sodium chloride  3 mL Intravenous Q12H   Continuous Infusions: . sodium chloride 100 mL/hr at 05/15/14 2010       The risks and  Benefits of transporting including disability, death and were discussed with the patient and was agreeable to the plan and further management.  Total Time in preparing paper work, todays exam and data evaluation: 45 mins  Signed: Starsha Morning Krystal Eaton M.D. [facility] 05/16/2014, 10:16 AM

## 2014-05-16 NOTE — H&P (Signed)
Erin Sexton is a 43 y.o. G0P0 with a history of Asthma, HTN, and HCOM who presented to [facility] ED with complaints of heavy vaginal bleeding and crampy lower ABD pain x 6 days. She reports soaking close to 20 pads daily. She has had progressive weakness, fatigue and near syncope. She had an episode of menorrhagia similar to this in 02/2014 after she was placed on Xarelto Rx for a probable PE. She was taken off the Xarelto and the vaginal bleeding and placed on Megace Rx and the vaginal bleeding gradually resolved. She was re-evaluated for the PE and was determined to be negative. She was admitted at [facility] for blood transfusion and started on megace following a Gyn consult. Patient continued to have heavy vaginal bleeding and Hg decreased to 6.4. Patient was transferred to [facility] hospital for further management.Patient reports that in the past year she noticed that her menses became abnormal. She describes heavier 7-day menses with passage of clots as well as periods of time when she bleeds for the entire month. Patient states that the megace, received yesterday, has not helped slow down her vaginal bleeding.  Review of Systems:  Constitutional: No Weight Loss, No Weight Gain, Night Sweats, Fevers, Chills, +Fatigue, or +Generalized Weakness  HEENT: No Headaches, Difficulty Swallowing,Tooth/Dental Problems,Sore Throat,  No Sneezing, Rhinitis, Ear Ache, Nasal Congestion, or Post Nasal Drip,  Cardio-vascular: No Chest pain, Orthopnea, PND, Edema in lower extremities, Anasarca, Dizziness, Palpitations  Resp: +Dyspnea, +DOE, No Cough, No Hemoptysis, No Wheezing.  GI: No Heartburn, Indigestion, +Abdominal Pain, Nausea, Vomiting, Diarrhea, Change in Bowel Habits, Loss of Appetite  GU: +Menorrhagia, No Dysuria, Change in Color of Urine, No Urgency or Frequency. No flank pain.  Musculoskeletal: No Joint Pain or Swelling. No Decreased Range of Motion. No Back Pain.  Neurologic: No Syncope, No  Seizures, Muscle Weakness, Paresthesia, Vision Disturbance or Loss, No Diplopia, No Vertigo, No Difficulty Walking,  Skin: No Rash or Lesions.  Psych: No Change in Mood or Affect. No Depression or Anxiety. No Memory loss. No Confusion or Hallucinations   Past Medical History   Diagnosis  Date   .  COPD (chronic obstructive pulmonary disease)    .  Depression    .  Hypertension    .  Asthma    .  Anxiety    .  Anemia    .  Pulmonary emboli    .  Blood transfusion without reported diagnosis    History reviewed. No pertinent past surgical history.  Prior to Admission medications   Medication  Sig  Start Date  End Date  Taking?  Authorizing Provider   albuterol (PROVENTIL HFA;VENTOLIN HFA) 108 (90 BASE) MCG/ACT inhaler  Inhale 2 puffs into the lungs every 6 (six) hours as needed for wheezing or shortness of breath.     Historical Provider, MD   albuterol (PROVENTIL) (2.5 MG/3ML) 0.083% nebulizer solution  Take 3 mLs (2.5 mg total) by nebulization every 6 (six) hours as needed for wheezing or shortness of breath.  03/03/14    Kathie Dike, MD   buPROPion (WELLBUTRIN XL) 150 MG 24 hr tablet  Take 1 tablet (150 mg total) by mouth daily.  03/03/14    Kathie Dike, MD   ferrous sulfate 325 (65 FE) MG tablet  Take 1 tablet (325 mg total) by mouth 2 (two) times daily with a meal.  03/08/14    Jonetta Osgood, MD   fluticasone (FLOVENT HFA) 110 MCG/ACT inhaler  Inhale 2 puffs  into the lungs 2 (two) times daily.  03/03/14    Kathie Dike, MD   metFORMIN (GLUCOPHAGE) 500 MG tablet  Take 1 tablet (500 mg total) by mouth 2 (two) times daily with a meal.  03/08/14    Shanker Kristeen Mans, MD   metoprolol (LOPRESSOR) 50 MG tablet  Take 1 tablet (50 mg total) by mouth 2 (two) times daily.  03/08/14    Shanker Kristeen Mans, MD   pravastatin (PRAVACHOL) 20 MG tablet  Take 1 tablet (20 mg total) by mouth daily.  03/03/14    Kathie Dike, MD   No Known Allergies  Social History: reports that she has never smoked.  She has never used smokeless tobacco. She reports that she does not drink alcohol or use illicit drugs.  No family history on file.   Physical Exam:  GEN: Pleasant Morbidly Obese and in no acute distress; cooperative with exam   CHEST: Normal respiration, clear to auscultation bilaterally  HEART: Regular rate and rhythm; no murmurs rubs or gallops  ABDOMEN: Positive Bowel Sounds, Obese, soft non-tender; No Rebound or Guarding, No masses, no organomegaly.  EXTREMITIES: No cyanosis, clubbing or edema; no ulcerations.  Genitalia: not examined  PULSES: 2+ and symmetric   Radiological Exams on Admission:  05/2014 ultrasound FINDINGS:  Examination is somewhat limited due to patient body habitus.  Uterus  Measurements: 6.9 x 7.8 x 12.0 cm. No fibroids or other mass  visualized.  Endometrium  Thickness: 27 mm (previously measured 15 mm). Thickened a moderately  heterogeneous likely due in part to hemorrhagic debris in this  patient with moderate vaginal bleeding. Could not exclude an  endometrial mass/polyp or hyperplasia.  Right ovary  Not visualized.  Left ovary  Not visualized.  Other findings  There is a large heterogeneous somewhat hyperechoic mass centered  over the cervix measuring 6 x 6 x 6.9 cm.  IMPRESSION:  Slightly enlarged uterus with moderately thickened heterogeneous  endometrium measuring 27 mm which has increased in thickness  compared to the prior exam. This is likely due in part to  hemorrhagic debris in this patient with moderate vaginal bleeding,  although cannot exclude a mass. New large heterogeneous echogenic  masslike area centered over the cervix measuring 6 x 6 x 6.9 cm  likely a large region of clot. Recommend GYN consultation.  If bleeding remains unresponsive to hormonal or medical therapy,  focal lesion work-up with sonohysterogram should be considered.  Endometrial biopsy should also be considered in pre-menopausal  patients at high risk for  endometrial carcinoma. (Ref: Radiological  Reasoning: Algorithmic Workup of Abnormal Vaginal Bleeding with  Endovaginal Sonography and Sonohysterography. AJR 2008; [id number]:[id number])  Nonvisualization of the ovaries.  Electronically Signed  By: Marin Olp M.D.  Assessment/Plan:  43 y.o.G0P0 with symptomatic anemia secondary to menorrhagia - Will check CBC at 4 pm following infusion of second unit of pRBC - Will medically manage vaginal bleeding with IV premarin - Continue iron supplementation - DVT prophylaxis with lovenox - D?C planning pending Hg and improvement in vaginal bleeding and symptoms

## 2014-05-16 NOTE — Progress Notes (Signed)
Report called to RN at [facility]. H&P and hospital course reviewed. All questions answered. Callie Fielding RN

## 2014-05-16 NOTE — Progress Notes (Signed)
CRITICAL VALUE ALERT  Critical value received:  hgb 6.4  Date of notification:  05/16/2014  Time of notification:  0645  Critical value read back:yes  Nurse who received alert:  Daleen Bo RN  MD notified (1st page):  Inis Sizer MD  Time of first page:  [phone]  MD notified (2nd page):  Time of second page:  Responding MD:  Vista Deck MD  Time MD responded:  [phone]

## 2014-05-17 ENCOUNTER — Encounter (HOSPITAL_COMMUNITY): Payer: Self-pay

## 2014-05-17 LAB — TYPE AND SCREEN
ABO/RH(D): O POS
ANTIBODY SCREEN: NEGATIVE
UNIT DIVISION: 0
Unit division: 0
Unit division: 0
Unit division: 0

## 2014-05-17 LAB — CBC
HCT: 22.7 % — ABNORMAL LOW (ref 36.0–46.0)
HEMOGLOBIN: 7.8 g/dL — AB (ref 12.0–15.0)
MCH: 28.7 pg (ref 26.0–34.0)
MCHC: 34.4 g/dL (ref 30.0–36.0)
MCV: 83.5 fL (ref 78.0–100.0)
Platelets: 194 10*3/uL (ref 150–400)
RBC: 2.72 MIL/uL — ABNORMAL LOW (ref 3.87–5.11)
RDW: 13.9 % (ref 11.5–15.5)
WBC: 14.4 10*3/uL — ABNORMAL HIGH (ref 4.0–10.5)

## 2014-05-17 MED ORDER — LEVOTHYROXINE SODIUM 25 MCG PO TABS: 25.0000 ug | ORAL_TABLET | Freq: Every day | ORAL | Status: DC

## 2014-05-17 MED ORDER — MEGESTROL ACETATE 40 MG PO TABS: 160.0000 mg | ORAL_TABLET | Freq: Every day | ORAL | Status: DC

## 2014-05-17 NOTE — ED Provider Notes (Signed)
Medical screening examination/treatment/procedure(s) were conducted as a shared visit with non-physician practitioner(s) and myself.  I personally evaluated the patient during the encounter.   EKG Interpretation None      44 yo female presenting with 1 week of vaginal bleeding.  On exam, pale, obese, appeared in some discomfort, lungs CTAB, S1,S2 with II/VI systolic murmur, abdomen soft with lower abdominal tenderness.  Initial Hg 10.6, but patient's physical exam was indicative of much more significant anemia.  And she had ongoing bleeding. And she had a pre-syncopal episode when she tried to stand up.  Admitted to internal medicine.    Clinical Impression: 1. Vaginal bleeding      Houston Siren III, MD 05/17/14 [phone]

## 2014-05-17 NOTE — Progress Notes (Signed)
Pt is discharged in the care of friend. Downstairs per wheelchair with N.T. Escort. Denies any heavy vaginal bleeding or pain. No equipment needed for home use. Discharge instructions with Rx were given to pt.Questions were asked and answered.

## 2014-05-17 NOTE — ED Provider Notes (Signed)
Medical screening examination/treatment/procedure(s) were conducted as a shared visit with non-physician practitioner(s) and myself.  I personally evaluated the patient during the encounter.   EKG Interpretation None        Houston Siren III, MD 05/17/14 [phone]

## 2014-05-17 NOTE — Discharge Summary (Signed)
Physician Discharge Summary  Patient ID: Erin Sexton MRN: [id number] DOB/AGE: 1971-10-18 43 y.o.  Admit date: 05/14/2014 Discharge date: 05/17/2014  Admission Diagnoses: menorrhagia with symptomatic anemia  Discharge Diagnoses: same Principal Problem:   Vaginal bleeding Active Problems:   Morbid obesity   Essential hypertension, benign   Asthma, chronic   Acute blood loss anemia   HOCM (hypertrophic obstructive cardiomyopathy)   Pre-syncope   Symptomatic anemia   Discharged Condition: good  Hospital Course: Patient admitted wih symptomatic anemia secondary to on-going menorrhagia. She received 6 units pRBC and medical management of her vaginal bleeding wit IV premarin and megace. She declined lovenox for DVT prophylaxis and opted for continuous SCD instead. On day of discharge her bleeding had stopped completely and patient denied feeling weak, fatigued or lightheaded. She plans to follow up with [facility] this week. Patient eager to be discharged.  Consults: None  Treatments: IV premarin, megace and 6 units pRBC  Discharge Exam: Blood pressure 111/50, pulse 90, temperature 98.3 F (36.8 C), temperature source Oral, resp. rate 20, height 5\' 9"  (1.753 m), weight 397 lb 7.8 oz (180.3 kg), SpO2 100.00%. General appearance: alert, cooperative and no distress Resp: clear to auscultation bilaterally Cardio: regular rate and rhythm GI: soft, non-tender; bowel sounds normal; no masses,  no organomegaly and morbidly obese Extremities: Homans sign is negative, no sign of DVT and no edema, redness or tenderness in the calves or thighs  Disposition: 01-Home or Self Care      Discharge Instructions   Diet Carb Modified    Complete by:  As directed      Discharge instructions    Complete by:  As directed   Please hold metoprolol until your blood pressure is stable. You can cut down to half a pill twice a day if your blood pressure is atleast above 120/80     Increase  activity slowly    Complete by:  As directed             Medication List    STOP taking these medications       metoprolol 50 MG tablet  Commonly known as:  LOPRESSOR      TAKE these medications       albuterol 108 (90 BASE) MCG/ACT inhaler  Commonly known as:  PROVENTIL HFA;VENTOLIN HFA  Inhale 2 puffs into the lungs every 6 (six) hours as needed for wheezing or shortness of breath.     albuterol (2.5 MG/3ML) 0.083% nebulizer solution  Commonly known as:  PROVENTIL  Take 3 mLs (2.5 mg total) by nebulization every 6 (six) hours as needed for wheezing or shortness of breath.     buPROPion 150 MG 24 hr tablet  Commonly known as:  WELLBUTRIN XL  Take 1 tablet (150 mg total) by mouth daily.     ferrous sulfate 325 (65 FE) MG tablet  Take 1 tablet (325 mg total) by mouth 2 (two) times daily with a meal.     fluticasone 110 MCG/ACT inhaler  Commonly known as:  FLOVENT HFA  Inhale 2 puffs into the lungs 2 (two) times daily.     levothyroxine 25 MCG tablet  Commonly known as:  SYNTHROID, LEVOTHROID  Take 25 mcg by mouth daily before breakfast.     megestrol 40 MG tablet  Commonly known as:  MEGACE  Take 3 tablets (120 mg total) by mouth daily. X 1 week, further taper by GYN MD     megestrol 40 MG tablet  Commonly known as:  MEGACE  Take 4 tablets (160 mg total) by mouth daily.     metFORMIN 500 MG tablet  Commonly known as:  GLUCOPHAGE  Take 1 tablet (500 mg total) by mouth 2 (two) times daily with a meal.     ondansetron 4 MG disintegrating tablet  Commonly known as:  ZOFRAN ODT  Take 1 tablet (4 mg total) by mouth every 8 (eight) hours as needed for nausea or vomiting.     oxyCODONE 5 MG immediate release tablet  Commonly known as:  Oxy IR/ROXICODONE  Take 1 tablet (5 mg total) by mouth every 6 (six) hours as needed for moderate pain or severe pain.     pravastatin 20 MG tablet  Commonly known as:  PRAVACHOL  Take 1 tablet (20 mg total) by mouth daily.        Follow-up Information   Call [facility]. (on Tuesday and ask for an appointment this week)    Specialty:  Obstetrics and Gynecology   Contact information:   [place] [address] [phone]      Signed: Mora Bellman 05/17/2014, 7:30 AM

## 2014-05-18 ENCOUNTER — Inpatient Hospital Stay (HOSPITAL_COMMUNITY)
Admission: EM | Admit: 2014-05-18 | Discharge: 2014-05-25 | DRG: 292 | Disposition: A | Discharge status: Other Acute Inpatient Hospital | Payer: Medicare Other | Attending: Cardiovascular Disease | Admitting: Cardiovascular Disease

## 2014-05-18 ENCOUNTER — Emergency Department (HOSPITAL_COMMUNITY): Payer: Medicare Other

## 2014-05-18 ENCOUNTER — Encounter (HOSPITAL_COMMUNITY): Payer: Self-pay | Admitting: Emergency Medicine

## 2014-05-18 DIAGNOSIS — I472 Ventricular tachycardia: Secondary | ICD-10-CM | POA: Insufficient documentation

## 2014-05-18 DIAGNOSIS — I10 Essential (primary) hypertension: Secondary | ICD-10-CM | POA: Diagnosis present

## 2014-05-18 DIAGNOSIS — T8389XA Other specified complication of genitourinary prosthetic devices, implants and grafts, initial encounter: Secondary | ICD-10-CM | POA: Diagnosis not present

## 2014-05-18 DIAGNOSIS — E1142 Type 2 diabetes mellitus with diabetic polyneuropathy: Secondary | ICD-10-CM | POA: Diagnosis present

## 2014-05-18 DIAGNOSIS — R0902 Hypoxemia: Secondary | ICD-10-CM | POA: Diagnosis not present

## 2014-05-18 DIAGNOSIS — N390 Urinary tract infection, site not specified: Secondary | ICD-10-CM

## 2014-05-18 DIAGNOSIS — E119 Type 2 diabetes mellitus without complications: Secondary | ICD-10-CM | POA: Diagnosis not present

## 2014-05-18 DIAGNOSIS — N920 Excessive and frequent menstruation with regular cycle: Secondary | ICD-10-CM | POA: Diagnosis present

## 2014-05-18 DIAGNOSIS — R5381 Other malaise: Secondary | ICD-10-CM | POA: Diagnosis present

## 2014-05-18 DIAGNOSIS — E876 Hypokalemia: Secondary | ICD-10-CM | POA: Diagnosis not present

## 2014-05-18 DIAGNOSIS — I471 Supraventricular tachycardia: Secondary | ICD-10-CM

## 2014-05-18 DIAGNOSIS — E1149 Type 2 diabetes mellitus with other diabetic neurological complication: Secondary | ICD-10-CM | POA: Diagnosis present

## 2014-05-18 DIAGNOSIS — F411 Generalized anxiety disorder: Secondary | ICD-10-CM | POA: Diagnosis present

## 2014-05-18 DIAGNOSIS — E039 Hypothyroidism, unspecified: Secondary | ICD-10-CM | POA: Diagnosis present

## 2014-05-18 DIAGNOSIS — I421 Obstructive hypertrophic cardiomyopathy: Secondary | ICD-10-CM | POA: Diagnosis not present

## 2014-05-18 DIAGNOSIS — D649 Anemia, unspecified: Secondary | ICD-10-CM

## 2014-05-18 DIAGNOSIS — R079 Chest pain, unspecified: Secondary | ICD-10-CM

## 2014-05-18 DIAGNOSIS — I509 Heart failure, unspecified: Secondary | ICD-10-CM

## 2014-05-18 DIAGNOSIS — E785 Hyperlipidemia, unspecified: Secondary | ICD-10-CM | POA: Diagnosis present

## 2014-05-18 DIAGNOSIS — I214 Non-ST elevation (NSTEMI) myocardial infarction: Secondary | ICD-10-CM

## 2014-05-18 DIAGNOSIS — I4729 Other ventricular tachycardia: Secondary | ICD-10-CM | POA: Insufficient documentation

## 2014-05-18 DIAGNOSIS — N939 Abnormal uterine and vaginal bleeding, unspecified: Secondary | ICD-10-CM | POA: Diagnosis present

## 2014-05-18 DIAGNOSIS — I5033 Acute on chronic diastolic (congestive) heart failure: Secondary | ICD-10-CM | POA: Diagnosis present

## 2014-05-18 DIAGNOSIS — A498 Other bacterial infections of unspecified site: Secondary | ICD-10-CM | POA: Diagnosis present

## 2014-05-18 DIAGNOSIS — Z8669 Personal history of other diseases of the nervous system and sense organs: Secondary | ICD-10-CM | POA: Diagnosis present

## 2014-05-18 DIAGNOSIS — G4733 Obstructive sleep apnea (adult) (pediatric): Secondary | ICD-10-CM | POA: Diagnosis present

## 2014-05-18 DIAGNOSIS — I252 Old myocardial infarction: Secondary | ICD-10-CM | POA: Diagnosis not present

## 2014-05-18 DIAGNOSIS — T83511A Infection and inflammatory reaction due to indwelling urethral catheter, initial encounter: Secondary | ICD-10-CM | POA: Diagnosis present

## 2014-05-18 DIAGNOSIS — D500 Iron deficiency anemia secondary to blood loss (chronic): Secondary | ICD-10-CM | POA: Diagnosis present

## 2014-05-18 DIAGNOSIS — J45909 Unspecified asthma, uncomplicated: Secondary | ICD-10-CM | POA: Diagnosis present

## 2014-05-18 DIAGNOSIS — Z6841 Body Mass Index (BMI) 40.0 and over, adult: Secondary | ICD-10-CM | POA: Diagnosis not present

## 2014-05-18 DIAGNOSIS — D62 Acute posthemorrhagic anemia: Secondary | ICD-10-CM | POA: Diagnosis present

## 2014-05-18 DIAGNOSIS — Z8249 Family history of ischemic heart disease and other diseases of the circulatory system: Secondary | ICD-10-CM | POA: Diagnosis not present

## 2014-05-18 DIAGNOSIS — Z79899 Other long term (current) drug therapy: Secondary | ICD-10-CM | POA: Diagnosis not present

## 2014-05-18 DIAGNOSIS — Y846 Urinary catheterization as the cause of abnormal reaction of the patient, or of later complication, without mention of misadventure at the time of the procedure: Secondary | ICD-10-CM | POA: Diagnosis present

## 2014-05-18 DIAGNOSIS — F3289 Other specified depressive episodes: Secondary | ICD-10-CM | POA: Diagnosis present

## 2014-05-18 DIAGNOSIS — J449 Chronic obstructive pulmonary disease, unspecified: Secondary | ICD-10-CM | POA: Diagnosis present

## 2014-05-18 DIAGNOSIS — F329 Major depressive disorder, single episode, unspecified: Secondary | ICD-10-CM | POA: Diagnosis present

## 2014-05-18 DIAGNOSIS — E669 Obesity, unspecified: Secondary | ICD-10-CM | POA: Diagnosis not present

## 2014-05-18 DIAGNOSIS — E1169 Type 2 diabetes mellitus with other specified complication: Secondary | ICD-10-CM | POA: Diagnosis present

## 2014-05-18 DIAGNOSIS — R5383 Other fatigue: Secondary | ICD-10-CM | POA: Diagnosis present

## 2014-05-18 HISTORY — DX: Abnormal uterine and vaginal bleeding, unspecified: N93.9

## 2014-05-18 HISTORY — DX: Ventricular tachycardia: I47.2

## 2014-05-18 HISTORY — DX: Supraventricular tachycardia: I47.1

## 2014-05-18 HISTORY — DX: Other ventricular tachycardia: I47.29

## 2014-05-18 HISTORY — DX: Obstructive hypertrophic cardiomyopathy: I42.1

## 2014-05-18 HISTORY — DX: Abnormal findings on diagnostic imaging of other specified body structures: R93.89

## 2014-05-18 HISTORY — DX: Morbid (severe) obesity due to excess calories: E66.01

## 2014-05-18 HISTORY — DX: Supraventricular tachycardia, unspecified: I47.10

## 2014-05-18 LAB — TYPE AND SCREEN
ABO/RH(D): O POS
ABO/RH(D): O POS
ANTIBODY SCREEN: POSITIVE
Antibody Screen: NEGATIVE
DAT, IgG: NEGATIVE
Unit division: 0
Unit division: 0

## 2014-05-18 LAB — TROPONIN I
TROPONIN I: 0.48 ng/mL — AB (ref <0.30)
TROPONIN I: 0.32 ng/mL — AB (ref <0.30)

## 2014-05-18 LAB — CBC WITH DIFFERENTIAL/PLATELET
BASOS ABS: 0 10*3/uL (ref 0.0–0.1)
BASOS PCT: 0 % (ref 0–1)
Basophils Absolute: 0 10*3/uL (ref 0.0–0.1)
Basophils Relative: 0 % (ref 0–1)
EOS ABS: 0.3 10*3/uL (ref 0.0–0.7)
Eosinophils Absolute: 0.3 10*3/uL (ref 0.0–0.7)
Eosinophils Relative: 2 % (ref 0–5)
Eosinophils Relative: 2 % (ref 0–5)
HCT: 21.4 % — ABNORMAL LOW (ref 36.0–46.0)
HCT: 22.4 % — ABNORMAL LOW (ref 36.0–46.0)
Hemoglobin: 7.3 g/dL — ABNORMAL LOW (ref 12.0–15.0)
Hemoglobin: 7.5 g/dL — ABNORMAL LOW (ref 12.0–15.0)
LYMPHS ABS: 2.3 10*3/uL (ref 0.7–4.0)
LYMPHS PCT: 12 % (ref 12–46)
LYMPHS PCT: 13 % (ref 12–46)
Lymphs Abs: 1.8 10*3/uL (ref 0.7–4.0)
MCH: 29.3 pg (ref 26.0–34.0)
MCH: 28.8 pg (ref 26.0–34.0)
MCHC: 34.1 g/dL (ref 30.0–36.0)
MCHC: 33.5 g/dL (ref 30.0–36.0)
MCV: 85.9 fL (ref 78.0–100.0)
MCV: 86.2 fL (ref 78.0–100.0)
Monocytes Absolute: 0.7 10*3/uL (ref 0.1–1.0)
Monocytes Absolute: 0.9 10*3/uL (ref 0.1–1.0)
Monocytes Relative: 5 % (ref 3–12)
Monocytes Relative: 5 % (ref 3–12)
NEUTROS ABS: 14.4 10*3/uL — AB (ref 1.7–7.7)
NEUTROS PCT: 80 % — AB (ref 43–77)
Neutro Abs: 12.4 10*3/uL — ABNORMAL HIGH (ref 1.7–7.7)
Neutrophils Relative %: 81 % — ABNORMAL HIGH (ref 43–77)
PLATELETS: 203 10*3/uL (ref 150–400)
PLATELETS: 239 10*3/uL (ref 150–400)
RBC: 2.49 MIL/uL — ABNORMAL LOW (ref 3.87–5.11)
RBC: 2.6 MIL/uL — AB (ref 3.87–5.11)
RDW: 14.2 % (ref 11.5–15.5)
RDW: 14.1 % (ref 11.5–15.5)
WBC: 15.2 10*3/uL — AB (ref 4.0–10.5)
WBC: 17.9 10*3/uL — ABNORMAL HIGH (ref 4.0–10.5)

## 2014-05-18 LAB — URINALYSIS, ROUTINE W REFLEX MICROSCOPIC
BILIRUBIN URINE: NEGATIVE
Glucose, UA: NEGATIVE mg/dL
KETONES UR: NEGATIVE mg/dL
Nitrite: NEGATIVE
Protein, ur: NEGATIVE mg/dL
Specific Gravity, Urine: 1.015 (ref 1.005–1.030)
UROBILINOGEN UA: 0.2 mg/dL (ref 0.0–1.0)
pH: 5.5 (ref 5.0–8.0)

## 2014-05-18 LAB — BASIC METABOLIC PANEL
BUN: 11 mg/dL (ref 6–23)
CALCIUM: 7.8 mg/dL — AB (ref 8.4–10.5)
CO2: 23 mEq/L (ref 19–32)
Chloride: 108 mEq/L (ref 96–112)
Creatinine, Ser: 0.8 mg/dL (ref 0.50–1.10)
GFR, EST NON AFRICAN AMERICAN: 88 mL/min — AB (ref >90)
Glucose, Bld: 128 mg/dL — ABNORMAL HIGH (ref 70–99)
Potassium: 4.4 mEq/L (ref 3.7–5.3)
SODIUM: 142 meq/L (ref 137–147)

## 2014-05-18 LAB — URINE MICROSCOPIC-ADD ON

## 2014-05-18 LAB — GLUCOSE, CAPILLARY: GLUCOSE-CAPILLARY: 124 mg/dL — AB (ref 70–99)

## 2014-05-18 LAB — MRSA PCR SCREENING: MRSA BY PCR: NEGATIVE

## 2014-05-18 LAB — PRO B NATRIURETIC PEPTIDE: PRO B NATRI PEPTIDE: 2656 pg/mL — AB (ref 0–125)

## 2014-05-18 MED ORDER — FUROSEMIDE 40 MG PO TABS
40.0000 mg | ORAL_TABLET | Freq: Every day | ORAL | Status: DC
  Administered 2014-05-19 – 2014-05-21 (×3): 40 mg via ORAL
  Filled 2014-05-18 (×4): qty 1

## 2014-05-18 MED ORDER — ONDANSETRON HCL 4 MG/2ML IJ SOLN
INTRAMUSCULAR | Status: AC
  Filled 2014-05-18: qty 2

## 2014-05-18 MED ORDER — FUROSEMIDE 10 MG/ML IJ SOLN
80.0000 mg | Freq: Once | INTRAMUSCULAR | Status: AC
  Administered 2014-05-18: 80 mg via INTRAVENOUS

## 2014-05-18 MED ORDER — METOPROLOL TARTRATE 50 MG PO TABS
50.0000 mg | ORAL_TABLET | Freq: Two times a day (BID) | ORAL | Status: DC
  Administered 2014-05-18 – 2014-05-19 (×2): 50 mg via ORAL
  Filled 2014-05-18 (×4): qty 1
  Filled 2014-05-18: qty 2
  Filled 2014-05-18: qty 1

## 2014-05-18 MED ORDER — LEVOTHYROXINE SODIUM 25 MCG PO TABS
25.0000 ug | ORAL_TABLET | Freq: Every day | ORAL | Status: DC
  Administered 2014-05-19 – 2014-05-25 (×7): 25 ug via ORAL
  Filled 2014-05-18 (×8): qty 1

## 2014-05-18 MED ORDER — ZOLPIDEM TARTRATE 5 MG PO TABS
5.0000 mg | ORAL_TABLET | Freq: Once | ORAL | Status: AC
  Administered 2014-05-18: 5 mg via ORAL
  Filled 2014-05-18: qty 1

## 2014-05-18 MED ORDER — INSULIN ASPART 100 UNIT/ML ~~LOC~~ SOLN
0.0000 [IU] | Freq: Three times a day (TID) | SUBCUTANEOUS | Status: DC
  Administered 2014-05-19 – 2014-05-25 (×5): 3 [IU] via SUBCUTANEOUS

## 2014-05-18 MED ORDER — MEGESTROL ACETATE 40 MG PO TABS
160.0000 mg | ORAL_TABLET | Freq: Every day | ORAL | Status: DC
  Administered 2014-05-19 – 2014-05-25 (×7): 160 mg via ORAL
  Filled 2014-05-18 (×8): qty 4

## 2014-05-18 MED ORDER — ONDANSETRON HCL 4 MG/2ML IJ SOLN
4.0000 mg | Freq: Three times a day (TID) | INTRAMUSCULAR | Status: DC | PRN
  Administered 2014-05-18: 4 mg via INTRAVENOUS

## 2014-05-18 MED ORDER — METOPROLOL TARTRATE 1 MG/ML IV SOLN
INTRAVENOUS | Status: AC
  Filled 2014-05-18: qty 5

## 2014-05-18 MED ORDER — BUPROPION HCL ER (XL) 150 MG PO TB24
150.0000 mg | ORAL_TABLET | Freq: Every day | ORAL | Status: DC
  Administered 2014-05-19 – 2014-05-25 (×7): 150 mg via ORAL
  Filled 2014-05-18 (×8): qty 1

## 2014-05-18 MED ORDER — ASPIRIN 81 MG PO CHEW: 324.0000 mg | CHEWABLE_TABLET | Freq: Once | ORAL | Status: AC

## 2014-05-18 MED ORDER — NITROGLYCERIN IN D5W 200-5 MCG/ML-% IV SOLN
5.0000 ug/min | Freq: Once | INTRAVENOUS | Status: AC
  Administered 2014-05-18: 5 ug/min via INTRAVENOUS

## 2014-05-18 MED ORDER — SIMVASTATIN 20 MG PO TABS
20.0000 mg | ORAL_TABLET | Freq: Every day | ORAL | Status: DC
  Administered 2014-05-19: 20 mg via ORAL
  Filled 2014-05-18 (×2): qty 1

## 2014-05-18 MED ORDER — HYDROMORPHONE HCL PF 1 MG/ML IJ SOLN
1.0000 mg | Freq: Once | INTRAMUSCULAR | Status: AC
  Administered 2014-05-18: 1 mg via INTRAVENOUS
  Filled 2014-05-18: qty 1

## 2014-05-18 MED ORDER — ASPIRIN 81 MG PO CHEW
324.0000 mg | CHEWABLE_TABLET | Freq: Once | ORAL | Status: AC
  Administered 2014-05-18: 324 mg via ORAL

## 2014-05-18 MED ORDER — SODIUM CHLORIDE 0.9 % IV BOLUS (SEPSIS)
250.0000 mL | Freq: Once | INTRAVENOUS | Status: AC
  Administered 2014-05-18: 250 mL via INTRAVENOUS

## 2014-05-18 MED ORDER — ALBUTEROL SULFATE (2.5 MG/3ML) 0.083% IN NEBU
2.5000 mg | INHALATION_SOLUTION | Freq: Four times a day (QID) | RESPIRATORY_TRACT | Status: DC | PRN
  Administered 2014-05-18 – 2014-05-20 (×3): 2.5 mg via RESPIRATORY_TRACT
  Filled 2014-05-18 (×3): qty 3

## 2014-05-18 MED ORDER — FLUTICASONE PROPIONATE HFA 110 MCG/ACT IN AERO
2.0000 | INHALATION_SPRAY | Freq: Two times a day (BID) | RESPIRATORY_TRACT | Status: DC
  Administered 2014-05-18 – 2014-05-25 (×12): 2 via RESPIRATORY_TRACT
  Filled 2014-05-18: qty 12

## 2014-05-18 MED ORDER — SODIUM CHLORIDE 0.9 % IV SOLN
INTRAVENOUS | Status: DC
  Administered 2014-05-18: 14:00:00 via INTRAVENOUS

## 2014-05-18 MED ORDER — METFORMIN HCL 500 MG PO TABS
500.0000 mg | ORAL_TABLET | Freq: Two times a day (BID) | ORAL | Status: DC
  Administered 2014-05-19 – 2014-05-25 (×14): 500 mg via ORAL
  Filled 2014-05-18 (×15): qty 1

## 2014-05-18 MED ORDER — ONDANSETRON HCL 4 MG/2ML IJ SOLN
4.0000 mg | Freq: Once | INTRAMUSCULAR | Status: AC
  Administered 2014-05-18: 4 mg via INTRAVENOUS
  Filled 2014-05-18: qty 2

## 2014-05-18 MED ORDER — METOPROLOL TARTRATE 1 MG/ML IV SOLN
5.0000 mg | Freq: Once | INTRAVENOUS | Status: AC
  Administered 2014-05-18: 5 mg via INTRAVENOUS

## 2014-05-18 MED ORDER — FERROUS SULFATE 325 (65 FE) MG PO TABS
325.0000 mg | ORAL_TABLET | Freq: Two times a day (BID) | ORAL | Status: DC
  Administered 2014-05-19 – 2014-05-25 (×14): 325 mg via ORAL
  Filled 2014-05-18 (×14): qty 1

## 2014-05-18 MED ORDER — ALBUTEROL SULFATE HFA 108 (90 BASE) MCG/ACT IN AERS: 2.0000 | INHALATION_SPRAY | Freq: Four times a day (QID) | RESPIRATORY_TRACT | Status: DC | PRN

## 2014-05-18 MED ORDER — SODIUM CHLORIDE 0.9 % IV SOLN: INTRAVENOUS | Status: AC

## 2014-05-18 MED ORDER — METOPROLOL TARTRATE 1 MG/ML IV SOLN
5.0000 mg | Freq: Once | INTRAVENOUS | Status: AC
  Administered 2014-05-18: 5 mg via INTRAVENOUS
  Filled 2014-05-18: qty 5

## 2014-05-18 NOTE — ED Notes (Signed)
Pt in room. Connected to bedside monitor. Visitor at bedside. Pale. ST, increased RR, shallow, accessory muscle use. [place] 2L. Stats mid 90's, Chest pain 4/10 midsternal/intermittent. States this started at 5pm last night and progressively worsened. Denies any vaginal bleeding since discharge from inpatient hospital yesterday.

## 2014-05-18 NOTE — ED Notes (Signed)
Pt on Bipap resting comfortably. Breathing "easier" per patient. VSS.

## 2014-05-18 NOTE — ED Notes (Signed)
1430- Pt c/o worsening SOB, myself, Peyton Najjar and Dr. Rogene Houston in room to assess pt. Pt SOB, accessory muscle use, stats 80's. See orders for details.

## 2014-05-18 NOTE — ED Provider Notes (Signed)
CSN: [id number]     Arrival date & time 05/18/14  1134 History  This chart was scribed for Fredia Sorrow, MD by Jeanell Sparrow, ED Scribe. This patient was seen in room APA19/[facility]19 and the patient's care was started at 12:46 PM.  Chief Complaint  Patient presents with  . Chest Pain    Patient is a 43 y.o. female presenting with chest pain. The history is provided by the patient and a relative. No language interpreter was used.  Chest Pain Pain location:  Substernal area Pain quality: tightness   Pain radiates to:  Lower back Pain severity:  Moderate Onset quality:  Sudden Timing:  Intermittent Progression:  Unchanged Chronicity:  Recurrent Relieved by:  None tried Worsened by:  Nothing tried Ineffective treatments:  None tried Associated symptoms: back pain, nausea, shortness of breath and vomiting   Associated symptoms: no cough, no fever and no headache    HPI Comments: Teresia Myint is a 43 y.o. female who presents to the Emergency Department complaining of vaginal bleeding. She denies any bleeding at this time. She states that she was given Progesterone and Megase with relief.   She also complains of substernal left-sided chest pain that radiates to her back. She rates the severity of the pain as a 10/10. She describes the pain as a tightness feeling. She also reports associated SOB.   Past Medical History  Diagnosis Date  . COPD (chronic obstructive pulmonary disease)   . Depression   . Hypertension   . Asthma   . Anxiety   . Anemia   . Pulmonary emboli   . Blood transfusion without reported diagnosis   . Diabetes mellitus without complication     pre   History reviewed. No pertinent past surgical history. History reviewed. No pertinent family history. History  Substance Use Topics  . Smoking status: Never Smoker   . Smokeless tobacco: Never Used  . Alcohol Use: No   OB History   Grav Para Term Preterm Abortions TAB SAB Ect Mult Living            0      Review of Systems  Constitutional: Negative for fever and chills.  HENT: Negative for rhinorrhea and sore throat.   Eyes: Positive for visual disturbance.  Respiratory: Positive for shortness of breath. Negative for cough.   Cardiovascular: Positive for chest pain and leg swelling.  Gastrointestinal: Positive for nausea and vomiting. Negative for diarrhea.  Genitourinary: Negative for dysuria and vaginal bleeding.  Musculoskeletal: Positive for back pain.  Skin: Negative for rash.  Neurological: Negative for headaches.  Hematological: Does not bruise/bleed easily.  Psychiatric/Behavioral: Negative for confusion.  All other systems reviewed and are negative.    Allergies  Review of patient's allergies indicates no known allergies.  Home Medications   Prior to Admission medications   Medication Sig Start Date End Date Taking? Authorizing Provider  albuterol (PROVENTIL HFA;VENTOLIN HFA) 108 (90 BASE) MCG/ACT inhaler Inhale 2 puffs into the lungs every 6 (six) hours as needed for wheezing or shortness of breath.   Yes Historical Provider, MD  albuterol (PROVENTIL) (2.5 MG/3ML) 0.083% nebulizer solution Take 3 mLs (2.5 mg total) by nebulization every 6 (six) hours as needed for wheezing or shortness of breath. 03/03/14  Yes Kathie Dike, MD  buPROPion (WELLBUTRIN XL) 150 MG 24 hr tablet Take 1 tablet (150 mg total) by mouth daily. 03/03/14  Yes Kathie Dike, MD  ferrous sulfate 325 (65 FE) MG tablet Take 1 tablet (325 mg  total) by mouth 2 (two) times daily with a meal. 03/08/14  Yes Shanker Kristeen Mans, MD  fluticasone (FLOVENT HFA) 110 MCG/ACT inhaler Inhale 2 puffs into the lungs 2 (two) times daily. 03/03/14  Yes Kathie Dike, MD  levothyroxine (SYNTHROID, LEVOTHROID) 25 MCG tablet Take 25 mcg by mouth daily before breakfast.   Yes Historical Provider, MD  megestrol (MEGACE) 40 MG tablet Take 4 tablets (160 mg total) by mouth daily. 05/17/14  Yes Peggy Constant, MD  metFORMIN  (GLUCOPHAGE) 500 MG tablet Take 1 tablet (500 mg total) by mouth 2 (two) times daily with a meal. 03/08/14  Yes Shanker Kristeen Mans, MD  pravastatin (PRAVACHOL) 20 MG tablet Take 1 tablet (20 mg total) by mouth daily. 03/03/14  Yes Kathie Dike, MD   BP 125/113  Pulse 94  Temp(Src) 98.1 F (36.7 C) (Oral)  Resp 28  Ht 5\' 9"  (1.753 m)  Wt 380 lb (172.367 kg)  BMI 56.09 kg/m2  SpO2 96% Physical Exam  Nursing note and vitals reviewed. Constitutional: She is oriented to person, place, and time. She appears well-developed and well-nourished. No distress.  HENT:  Head: Normocephalic and atraumatic.  Eyes: EOM are normal.  Neck: Neck supple. No tracheal deviation present.  Cardiovascular: Normal rate and regular rhythm.   No murmur heard. Pulmonary/Chest: Effort normal and breath sounds normal. No respiratory distress.  Abdominal: Bowel sounds are normal. There is no tenderness.  Musculoskeletal: Normal range of motion. She exhibits no edema.  No pitting edema  Neurological: She is alert and oriented to person, place, and time.  Skin: Skin is warm and dry.  Psychiatric: She has a normal mood and affect. Her behavior is normal.    ED Course  Procedures (including critical care time) DIAGNOSTIC STUDIES: Oxygen Saturation is 96% on RA, normal by my interpretation.    COORDINATION OF CARE: 12:54 PM- Pt advised of plan for treatment which includes medication, radiology, and labs and pt agrees.  Results for orders placed during the hospital encounter of 05/18/14  CBC WITH DIFFERENTIAL      Result Value Ref Range   WBC 15.2 (*) 4.0 - 10.5 K/uL   RBC 2.49 (*) 3.87 - 5.11 MIL/uL   Hemoglobin 7.3 (*) 12.0 - 15.0 g/dL   HCT 21.4 (*) 36.0 - 46.0 %   MCV 85.9  78.0 - 100.0 fL   MCH 29.3  26.0 - 34.0 pg   MCHC 34.1  30.0 - 36.0 g/dL   RDW 14.2  11.5 - 15.5 %   Platelets 203  150 - 400 K/uL   Neutrophils Relative % 81 (*) 43 - 77 %   Neutro Abs 12.4 (*) 1.7 - 7.7 K/uL   Lymphocytes  Relative 12  12 - 46 %   Lymphs Abs 1.8  0.7 - 4.0 K/uL   Monocytes Relative 5  3 - 12 %   Monocytes Absolute 0.7  0.1 - 1.0 K/uL   Eosinophils Relative 2  0 - 5 %   Eosinophils Absolute 0.3  0.0 - 0.7 K/uL   Basophils Relative 0  0 - 1 %   Basophils Absolute 0.0  0.0 - 0.1 K/uL  BASIC METABOLIC PANEL      Result Value Ref Range   Sodium 142  137 - 147 mEq/L   Potassium 4.4  3.7 - 5.3 mEq/L   Chloride 108  96 - 112 mEq/L   CO2 23  19 - 32 mEq/L   Glucose, Bld 128 (*) 70 -  99 mg/dL   BUN 11  6 - 23 mg/dL   Creatinine, Ser 0.80  0.50 - 1.10 mg/dL   Calcium 7.8 (*) 8.4 - 10.5 mg/dL   GFR calc non Af Amer 88 (*) >90 mL/min   GFR calc Af Amer >90  >90 mL/min  TROPONIN I      Result Value Ref Range   Troponin I 0.48 (*) <0.30 ng/mL  PRO B NATRIURETIC PEPTIDE      Result Value Ref Range   Pro B Natriuretic peptide (BNP) 2656.0 (*) 0 - 125 pg/mL   US Transvaginal Non-ob  05/15/2014   CLINICAL DATA:  Vaginal bleeding since March 2015.  EXAM: TRANSABDOMINAL AND TRANSVAGINAL ULTRASOUND OF PELVIS  TECHNIQUE: Both transabdominal and transvaginal ultrasound examinations of the pelvis were performed. Transabdominal technique was performed for global imaging of the pelvis including uterus, ovaries, adnexal regions, and pelvic cul-de-sac. It was necessary to proceed with endovaginal exam following the transabdominal exam to visualize the endometrium and ovaries.  COMPARISON:  03/06/2014  FINDINGS: Examination is somewhat limited due to patient body habitus.  Uterus  Measurements: 6.9 x 7.8 x 12.0 cm. No fibroids or other mass visualized.  Endometrium  Thickness: 27 mm (previously measured 15 mm). Thickened a moderately heterogeneous likely due in part to hemorrhagic debris in this patient with moderate vaginal bleeding. Could not exclude an endometrial mass/polyp or hyperplasia.  Right ovary  Not visualized.  Left ovary  Not visualized.  Other findings  There is a large heterogeneous somewhat hyperechoic  mass centered over the cervix measuring 6 x 6 x 6.9 cm.  IMPRESSION: Slightly enlarged uterus with moderately thickened heterogeneous endometrium measuring 27 mm which has increased in thickness compared to the prior exam. This is likely due in part to hemorrhagic debris in this patient with moderate vaginal bleeding, although cannot exclude a mass. New large heterogeneous echogenic masslike area centered over the cervix measuring 6 x 6 x 6.9 cm likely a large region of clot. Recommend GYN consultation.  If bleeding remains unresponsive to hormonal or medical therapy, focal lesion work-up with sonohysterogram should be considered. Endometrial biopsy should also be considered in pre-menopausal patients at high risk for endometrial carcinoma. (Ref: Radiological Reasoning: Algorithmic Workup of Abnormal Vaginal Bleeding with Endovaginal Sonography and Sonohysterography. AJR 2008; 308:M57-84)  Nonvisualization of the ovaries.   Electronically Signed   By: Marin Olp M.D.   On: 05/15/2014 07:19   US Pelvis Complete  05/15/2014   CLINICAL DATA:  Vaginal bleeding since March 2015.  EXAM: TRANSABDOMINAL AND TRANSVAGINAL ULTRASOUND OF PELVIS  TECHNIQUE: Both transabdominal and transvaginal ultrasound examinations of the pelvis were performed. Transabdominal technique was performed for global imaging of the pelvis including uterus, ovaries, adnexal regions, and pelvic cul-de-sac. It was necessary to proceed with endovaginal exam following the transabdominal exam to visualize the endometrium and ovaries.  COMPARISON:  03/06/2014  FINDINGS: Examination is somewhat limited due to patient body habitus.  Uterus  Measurements: 6.9 x 7.8 x 12.0 cm. No fibroids or other mass visualized.  Endometrium  Thickness: 27 mm (previously measured 15 mm). Thickened a moderately heterogeneous likely due in part to hemorrhagic debris in this patient with moderate vaginal bleeding. Could not exclude an endometrial mass/polyp or hyperplasia.   Right ovary  Not visualized.  Left ovary  Not visualized.  Other findings  There is a large heterogeneous somewhat hyperechoic mass centered over the cervix measuring 6 x 6 x 6.9 cm.  IMPRESSION: Slightly enlarged uterus with moderately  thickened heterogeneous endometrium measuring 27 mm which has increased in thickness compared to the prior exam. This is likely due in part to hemorrhagic debris in this patient with moderate vaginal bleeding, although cannot exclude a mass. New large heterogeneous echogenic masslike area centered over the cervix measuring 6 x 6 x 6.9 cm likely a large region of clot. Recommend GYN consultation.  If bleeding remains unresponsive to hormonal or medical therapy, focal lesion work-up with sonohysterogram should be considered. Endometrial biopsy should also be considered in pre-menopausal patients at high risk for endometrial carcinoma. (Ref: Radiological Reasoning: Algorithmic Workup of Abnormal Vaginal Bleeding with Endovaginal Sonography and Sonohysterography. AJR 2008; 151:V61-60)  Nonvisualization of the ovaries.   Electronically Signed   By: Marin Olp M.D.   On: 05/15/2014 07:19    Medications  0.9 %  sodium chloride infusion ( Intravenous Stopped 05/18/14 1433)  sodium chloride 0.9 % bolus 250 mL (0 mLs Intravenous Stopped 05/18/14 1415)  ondansetron (ZOFRAN) injection 4 mg (4 mg Intravenous Given 05/18/14 1336)  HYDROmorphone (DILAUDID) injection 1 mg (1 mg Intravenous Given 05/18/14 1336)  aspirin chewable tablet 324 mg (324 mg Oral Given 05/18/14 1448)  aspirin chewable tablet 324 mg (0 mg Oral Duplicate 7/37/10 6269)  furosemide (LASIX) injection 80 mg (80 mg Intravenous Given 05/18/14 1456)  nitroGLYCERIN 0.2 mg/mL in dextrose 5 % infusion (5 mcg/min Intravenous New Bag/Given 05/18/14 1457)  ,  Labs Review Labs Reviewed  CBC WITH DIFFERENTIAL - Abnormal; Notable for the following:    WBC 15.2 (*)    RBC 2.49 (*)    Hemoglobin 7.3 (*)    HCT 21.4 (*)     Neutrophils Relative % 81 (*)    Neutro Abs 12.4 (*)    All other components within normal limits  BASIC METABOLIC PANEL - Abnormal; Notable for the following:    Glucose, Bld 128 (*)    Calcium 7.8 (*)    GFR calc non Af Amer 88 (*)    All other components within normal limits  TROPONIN I - Abnormal; Notable for the following:    Troponin I 0.48 (*)    All other components within normal limits  PRO B NATRIURETIC PEPTIDE - Abnormal; Notable for the following:    Pro B Natriuretic peptide (BNP) 2656.0 (*)    All other components within normal limits  URINALYSIS, ROUTINE W REFLEX MICROSCOPIC  TYPE AND SCREEN   Results for orders placed during the hospital encounter of 05/18/14  CBC WITH DIFFERENTIAL      Result Value Ref Range   WBC 15.2 (*) 4.0 - 10.5 K/uL   RBC 2.49 (*) 3.87 - 5.11 MIL/uL   Hemoglobin 7.3 (*) 12.0 - 15.0 g/dL   HCT 21.4 (*) 36.0 - 46.0 %   MCV 85.9  78.0 - 100.0 fL   MCH 29.3  26.0 - 34.0 pg   MCHC 34.1  30.0 - 36.0 g/dL   RDW 14.2  11.5 - 15.5 %   Platelets 203  150 - 400 K/uL   Neutrophils Relative % 81 (*) 43 - 77 %   Neutro Abs 12.4 (*) 1.7 - 7.7 K/uL   Lymphocytes Relative 12  12 - 46 %   Lymphs Abs 1.8  0.7 - 4.0 K/uL   Monocytes Relative 5  3 - 12 %   Monocytes Absolute 0.7  0.1 - 1.0 K/uL   Eosinophils Relative 2  0 - 5 %   Eosinophils Absolute 0.3  0.0 - 0.7 K/uL  Basophils Relative 0  0 - 1 %   Basophils Absolute 0.0  0.0 - 0.1 K/uL  BASIC METABOLIC PANEL      Result Value Ref Range   Sodium 142  137 - 147 mEq/L   Potassium 4.4  3.7 - 5.3 mEq/L   Chloride 108  96 - 112 mEq/L   CO2 23  19 - 32 mEq/L   Glucose, Bld 128 (*) 70 - 99 mg/dL   BUN 11  6 - 23 mg/dL   Creatinine, Ser 0.80  0.50 - 1.10 mg/dL   Calcium 7.8 (*) 8.4 - 10.5 mg/dL   GFR calc non Af Amer 88 (*) >90 mL/min   GFR calc Af Amer >90  >90 mL/min  TROPONIN I      Result Value Ref Range   Troponin I 0.48 (*) <0.30 ng/mL  PRO B NATRIURETIC PEPTIDE      Result Value Ref Range    Pro B Natriuretic peptide (BNP) 2656.0 (*) 0 - 125 pg/mL     Imaging Review Dg Chest Port 1 View  05/18/2014   CLINICAL DATA:  Chest pain and shortness of breath  EXAM: PORTABLE CHEST - 1 VIEW  COMPARISON:  03/05/2014  FINDINGS: Cardiac shadow is mildly enlarged. Diffuse vascular congestion and parenchymal opacities are noted consistent with congestive failure. No pneumothorax or sizable effusion is seen.  IMPRESSION: Changes consistent with congestive failure.   Electronically Signed   By: Inez Catalina M.D.   On: 05/18/2014 14:33     EKG Interpretation   Date/Time:  Wednesday May 18 2014 11:46:41 EDT Ventricular Rate:  96 PR Interval:  135 QRS Duration: 94 QT Interval:  369 QTC Calculation: 466 R Axis:   30 Text Interpretation:  Sinus rhythm tachycardia resolved No significant  change since last tracing Confirmed by Taraoluwa Thakur  MD, Crissy Mccreadie [phone]) on  05/18/2014 12:31:55 PM      Repeat EKG after the positive troponin does show some minimal ST segment depression in diffuse leads but nothing consistent with a acute STEMI infarct.   CRITICAL CARE Performed by: Fredia Sorrow Total critical care time: 60 Critical care time was exclusive of separately billable procedures and treating other patients. Critical care was necessary to treat or prevent imminent or life-threatening deterioration. Critical care was time spent personally by me on the following activities: development of treatment plan with patient and/or surrogate as well as nursing, discussions with consultants, evaluation of patient's response to treatment, examination of patient, obtaining history from patient or surrogate, ordering and performing treatments and interventions, ordering and review of laboratory studies, ordering and review of radiographic studies, pulse oximetry and re-evaluation of patient's condition.      MDM   Final diagnoses:  Hypoxia  CHF (congestive heart failure)  Chest pain  Anemia   Non-STEMI (non-ST elevated myocardial infarction)     Patient's chest x-ray consistent with congestive heart failure. Patient also with a positive troponin which be consistent with a non-STEMI since she is having active chest pain. Patient also of with hypoxia on room air sats were originally below 90% on 2 L she was in the low 90s. Patient during her ED stay took her oxygen off decompensated oxygen levels went down into the 80s she got very short of breath and air hungry. Patient started on 100% nonrebreather she levels came back up to 100% switched over to BiPAP. Patient started on nitroglycerin drip. Patient with started on Lasix 80 mg in a Foley catheter placed.  Patient has  a history of hypertrophic obstructive cardiomyopathy followed by [facility] cardiology. We'll discuss with them. Patient will require transfer most likely to [facility]. Patient also with recent blood transfusion 6 units over the last 24 hours. This may have contributed to her CHF. Patient started on BiPAP due to CHF and the air hunger and hypoxia patient doing well on that. I will also help improve her CHF. Patient did have chest pain the chest pain started last night at 6:00 in the evening.  In discussion with cardiology due to her of obstructive cardiomyopathy they recommend stopping the nitroglycerin drip we had done that. Stated that could give her a drastic drop in her blood pressure. Recommended Lopressor 5 mg IV Lopressor ordered. May require more. They have accepted her in transfer to step down unit at [facility] will do the transfer.  Patient showing improvement on the BiPAP.    I personally performed the services described in this documentation, which was scribed in my presence. The recorded information has been reviewed and is accurate.      Fredia Sorrow, MD 05/18/14 1515

## 2014-05-18 NOTE — ED Notes (Signed)
Spoke with [employer] from [place]. Report given. All questions answered. ETA 45 mins

## 2014-05-18 NOTE — ED Notes (Signed)
Pt recently admitted and discharged yesterday due to vaginal bleeding and pt received 6 units of blood, pt sob and CP, denies vaginal bleeding at this time

## 2014-05-18 NOTE — ED Notes (Signed)
Pt left via EMS. VSS. NRB. Pt stable upon transport. Denies SOB, Denies pain. No acute distress.

## 2014-05-18 NOTE — H&P (Signed)
History and Physical   Admit date: 05/18/2014 Name:  Erin Sexton Medical record number: [id number] DOB/Age:  1971-02-24  43 y.o. female   Primary Cardiologist:  Dr. Sanjuan Dame  Primary Physician:  Dr. Anastasio Champion  Chief complaint/reason for admission: Shortness of breath and chest tightness  HPI:  This 43 year-old female has a known history of hypertrophic cardiomyopathy and is had severe menorrhagia recently. She was recently admitted with profuse vaginal bleeding and received transfusion of 6 units of packed cells and was sent home yesterday but was mildly short of breath. Her vaginal bleeding is being managed with Premarin and Megace. She developed worsening dyspnea last night and presented to [facility] in [place] with congestive heart failure and pulmonary edema today. She was treated with BiPAP and also given intravenous Lasix as well as 5 mg of Lopressor. Nitroglycerin drip had been started and was stopped. She has diuresed significantly and was transferred here in is currently feeling much better. She has a very mild elevation of troponin. She currently denies any chest tightness or heaviness and is not short of breath at the present time. She has had some edema since her recent admission.  She has severe morbid obesity and was not felt to be a good candidate for noninvasive or invasive testing and has been managed conservatively. She is still significantly anemic.   Past Medical History  Diagnosis Date  . COPD (chronic obstructive pulmonary disease)   . Depression   . Hypertension   . Asthma   . Anxiety   . Anemia   . Blood transfusion without reported diagnosis   . Diabetes mellitus without complication     a. Dx 02/2014.  [name] HOCM (hypertrophic obstructive cardiomyopathy)     a. Dx 78295.  . Vaginal bleeding   . SVT (supraventricular tachycardia)     a. Noted on tele during 02/2014 adm for symptomatic anemia.  [name] NSVT (nonsustained ventricular tachycardia)     a.  Noted on tele during 02/2014 adm for symptomatic anemia.  . Morbid obesity   . Abnormal CT scan     a. 02/2014: evaluated for dyspnea and initially placed on Xarelto for PE because radiologist could not exclude small peripheral pulmonary emboli because of limited contrast. However, after admission for ++vaginal bleeding several days later, it was felt she did NOT have PE - underwent further testing with neg VQ 03/06/14, neg LE duplex.     History reviewed. No pertinent past surgical history..  Allergies: has No Known Allergies.   Medications: Prior to Admission medications   Medication Sig Start Date End Date Taking? Authorizing Provider  albuterol (PROVENTIL HFA;VENTOLIN HFA) 108 (90 BASE) MCG/ACT inhaler Inhale 2 puffs into the lungs every 6 (six) hours as needed for wheezing or shortness of breath.   Yes Historical Provider, MD  albuterol (PROVENTIL) (2.5 MG/3ML) 0.083% nebulizer solution Take 3 mLs (2.5 mg total) by nebulization every 6 (six) hours as needed for wheezing or shortness of breath. 03/03/14  Yes Kathie Dike, MD  buPROPion (WELLBUTRIN XL) 150 MG 24 hr tablet Take 1 tablet (150 mg total) by mouth daily. 03/03/14  Yes Kathie Dike, MD  ferrous sulfate 325 (65 FE) MG tablet Take 1 tablet (325 mg total) by mouth 2 (two) times daily with a meal. 03/08/14  Yes Shanker Kristeen Mans, MD  fluticasone (FLOVENT HFA) 110 MCG/ACT inhaler Inhale 2 puffs into the lungs 2 (two) times daily. 03/03/14  Yes Kathie Dike, MD  levothyroxine (SYNTHROID, LEVOTHROID) 25 MCG tablet  Take 25 mcg by mouth daily before breakfast.   Yes Historical Provider, MD  megestrol (MEGACE) 40 MG tablet Take 4 tablets (160 mg total) by mouth daily. 05/17/14  Yes Peggy Constant, MD  metFORMIN (GLUCOPHAGE) 500 MG tablet Take 1 tablet (500 mg total) by mouth 2 (two) times daily with a meal. 03/08/14  Yes Shanker Kristeen Mans, MD  pravastatin (PRAVACHOL) 20 MG tablet Take 1 tablet (20 mg total) by mouth daily. 03/03/14  Yes Kathie Dike, MD   Family History:  Family Status  Relation Status Death Age  . Father Alive     History of coronary bypass grafting at age 83  . Mother Alive   . Sister Alive     WPW    Social History:   reports that she has never smoked. She has never used smokeless tobacco. She reports that she does not drink alcohol or use illicit drugs.   History   Social History Narrative  . No narrative on file    Review of Systems: She has a history of significant depression as well as some anxiety and has recently moved to the area. She has had significant menorrhagia that is currently being taken care of. She has severe morbid obesity. She has a history of asthma in the past. Other than as noted above, the remainder of the review of systems is normal  Physical Exam: BP 134/48  Pulse 93  Temp(Src) 99.1 F (37.3 C) (Oral)  Resp 24  Ht 5\' 9"  (1.753 m)  Wt 181.6 kg (400 lb 5.7 oz)  BMI 59.10 kg/m2  SpO2 100%  General appearance: Friendly very large white female currently in no acute respiratory distress Head: Normocephalic, without obvious abnormality, atraumatic Eyes: conjunctivae/corneas clear. PERRL, EOM's intact. Fundi benign. Neck: Difficult to tell if there is JVD or not, no carotid bruits  Lungs: Reduced breath sounds at the bases Heart: Regular rate and rhythm, normal Q3-F3, holosystolic 5-4/5 systolic murmur at the left sternal border, no S3 Abdomen: Very large soft no definite tenderness Pelvic: deferred Extremities: 2+ peripheral edema Pulses: 2+ and symmetric Neurologic: Grossly normal  Labs: CBC  Recent Labs  05/18/14 1345  WBC 15.2*  RBC 2.49*  HGB 7.3*  HCT 21.4*  PLT 203  MCV 85.9  MCH 29.3  MCHC 34.1  RDW 14.2  LYMPHSABS 1.8  MONOABS 0.7  EOSABS 0.3  BASOSABS 0.0   CMP   Recent Labs  05/18/14 1345  NA 142  K 4.4  CL 108  CO2 23  GLUCOSE 128*  BUN 11  CREATININE 0.80  CALCIUM 7.8*  GFRNONAA 88*  GFRAA >90   BNP (last 3  results)  Recent Labs  03/07/14 0352 03/07/14 0841 05/18/14 1345  PROBNP 306.1* 264.6* 2656.0*   Cardiac Panel (last 3 results)  Recent Labs  05/18/14 1345  TROPONINI 0.48*    EKG: Sinus rhythm with nonspecific ST changes  Radiology: Congestive heart failure, cardiomegaly   IMPRESSIONS: 1. Acute diastolic congestive heart failure due to volume overload and anemia 2. Hypertrophic cardiomyopathy with significant resting outflow tract gradient 3. Elevation of troponin which is borderline but doubt that this is an acute coronary syndrome 4. Severe morbid obesity 5. Anemia due to blood loss with recent transfusion 6 units 6. Hypertension 7. Chronic asthma  PLAN: She is clinically feeling much better and will be kept on a liquid diet tonight with progression to a full diet tomorrow. Cycle cardiac enzymes. No heparin because of recent bleeding. Continued to  diurese some but be careful because the hypertrophic cardiomyopathy. In view of her chronic asthma, verapamil may be a better agent for her in the long run. Will defer this to primary team.  Signed: W. Doristine Church MD [facility]  05/18/2014, 7:38 PM

## 2014-05-18 NOTE — ED Notes (Signed)
CRITICAL VALUE ALERT  Critical value received:  Troponin .48  Date of notification:  05/18/14  Time of notification:  8592  Critical value read back:yes  Nurse who received alert:  Parthenia Ames  MD notified (1st page):  Dr.Zacowski  Time of first page:  52  MD notified (2nd page):  Time of second page:  Responding MD:  Dr. Wallie Char  Time MD responded:  [phone]

## 2014-05-19 ENCOUNTER — Encounter: Payer: Self-pay | Admitting: Obstetrics & Gynecology

## 2014-05-19 DIAGNOSIS — R0902 Hypoxemia: Secondary | ICD-10-CM

## 2014-05-19 DIAGNOSIS — I5033 Acute on chronic diastolic (congestive) heart failure: Principal | ICD-10-CM

## 2014-05-19 DIAGNOSIS — I421 Obstructive hypertrophic cardiomyopathy: Secondary | ICD-10-CM

## 2014-05-19 DIAGNOSIS — E119 Type 2 diabetes mellitus without complications: Secondary | ICD-10-CM

## 2014-05-19 DIAGNOSIS — I509 Heart failure, unspecified: Secondary | ICD-10-CM

## 2014-05-19 DIAGNOSIS — I10 Essential (primary) hypertension: Secondary | ICD-10-CM

## 2014-05-19 DIAGNOSIS — D62 Acute posthemorrhagic anemia: Secondary | ICD-10-CM

## 2014-05-19 LAB — BASIC METABOLIC PANEL
BUN: 10 mg/dL (ref 6–23)
CO2: 19 meq/L (ref 19–32)
Calcium: 8 mg/dL — ABNORMAL LOW (ref 8.4–10.5)
Chloride: 102 mEq/L (ref 96–112)
Creatinine, Ser: 0.88 mg/dL (ref 0.50–1.10)
GFR calc Af Amer: >90 mL/min (ref >90)
GFR calc non Af Amer: 78 mL/min — ABNORMAL LOW (ref >90)
Glucose, Bld: 160 mg/dL — ABNORMAL HIGH (ref 70–99)
POTASSIUM: 4 meq/L (ref 3.7–5.3)
SODIUM: 140 meq/L (ref 137–147)

## 2014-05-19 LAB — GLUCOSE, CAPILLARY
GLUCOSE-CAPILLARY: 128 mg/dL — AB (ref 70–99)
GLUCOSE-CAPILLARY: 135 mg/dL — AB (ref 70–99)
GLUCOSE-CAPILLARY: 106 mg/dL — AB (ref 70–99)
GLUCOSE-CAPILLARY: 135 mg/dL — AB (ref 70–99)

## 2014-05-19 LAB — HEMOGLOBIN A1C
HEMOGLOBIN A1C: 5.7 % — AB (ref <5.7)
MEAN PLASMA GLUCOSE: 117 mg/dL — AB (ref <117)

## 2014-05-19 LAB — TROPONIN I
Troponin I: 0.32 ng/mL (ref <0.30)
Troponin I: 0.32 ng/mL (ref <0.30)

## 2014-05-19 MED ORDER — ACETAMINOPHEN 325 MG PO TABS
650.0000 mg | ORAL_TABLET | Freq: Four times a day (QID) | ORAL | Status: DC | PRN
  Administered 2014-05-19 – 2014-05-23 (×5): 650 mg via ORAL
  Filled 2014-05-19 (×5): qty 2

## 2014-05-19 MED ORDER — MORPHINE SULFATE 2 MG/ML IJ SOLN
2.0000 mg | INTRAMUSCULAR | Status: DC | PRN
  Administered 2014-05-19 – 2014-05-23 (×2): 2 mg via INTRAVENOUS
  Filled 2014-05-19 (×2): qty 1

## 2014-05-19 MED ORDER — ONDANSETRON HCL 4 MG/2ML IJ SOLN
4.0000 mg | Freq: Once | INTRAMUSCULAR | Status: AC
  Administered 2014-05-19: 4 mg via INTRAVENOUS
  Filled 2014-05-19: qty 2

## 2014-05-19 MED ORDER — ALUM & MAG HYDROXIDE-SIMETH 200-200-20 MG/5ML PO SUSP
30.0000 mL | ORAL | Status: DC | PRN
  Administered 2014-05-21: 30 mL via ORAL
  Filled 2014-05-19: qty 30

## 2014-05-19 NOTE — Progress Notes (Signed)
DAILY PROGRESS NOTE  Subjective:  Doesn't feel well today - can't explain why. Mild discomfort, breathing has improved. Elevated troponin which has remained stable and is explainable by heart failure. EKG does not show ST or T wave changes concerning for  ischemia.   Objective:  Temp:  [98 F (36.7 C)-100.3 F (37.9 C)] 98.7 F (37.1 C) (06/11 0737) Pulse Rate:  [87-112] 99 (06/11 0850) Resp:  [11-41] 29 (06/11 0850) BP: (95-149)/(42-129) 95/52 mmHg (06/11 0737) SpO2:  [88 %-100 %] 97 % (06/11 0850) Weight:  [380 lb (172.367 kg)-400 lb 5.7 oz (181.6 kg)] 399 lb 4.1 oz (181.1 kg) (06/11 0437) Weight change:   Intake/Output from previous day: 06/10 0701 - 06/11 0700 In: 226 [P.O.:220] Out: 3025 [Urine:3025]  Intake/Output from this shift: Total I/O In: -  Out: 200 [Urine:200]  Medications: Current Facility-Administered Medications  Medication Dose Route Frequency Provider Last Rate Last Dose  . 0.9 %  sodium chloride infusion   Intravenous Continuous Fredia Sorrow, MD      . 0.9 %  sodium chloride infusion   Intravenous STAT Fredia Sorrow, MD      . albuterol (PROVENTIL) (2.5 MG/3ML) 0.083% nebulizer solution 2.5 mg  2.5 mg Nebulization Q6H PRN Jacolyn Reedy, MD   2.5 mg at 05/19/14 0256  . buPROPion (WELLBUTRIN XL) 24 hr tablet 150 mg  150 mg Oral Daily Jacolyn Reedy, MD   150 mg at 05/19/14 0910  . ferrous sulfate tablet 325 mg  325 mg Oral BID WC Jacolyn Reedy, MD   325 mg at 05/19/14 0900  . fluticasone (FLOVENT HFA) 110 MCG/ACT inhaler 2 puff  2 puff Inhalation BID Jacolyn Reedy, MD   2 puff at 05/19/14 0849  . furosemide (LASIX) tablet 40 mg  40 mg Oral Daily Jacolyn Reedy, MD   40 mg at 05/19/14 0911  . insulin aspart (novoLOG) injection 0-20 Units  0-20 Units Subcutaneous TID WC Jacolyn Reedy, MD   3 Units at 05/19/14 0755  . levothyroxine (SYNTHROID, LEVOTHROID) tablet 25 mcg  25 mcg Oral QAC breakfast Jacolyn Reedy, MD   25 mcg at 05/19/14  0800  . megestrol (MEGACE) tablet 160 mg  160 mg Oral Daily Jacolyn Reedy, MD   160 mg at 05/19/14 0909  . metFORMIN (GLUCOPHAGE) tablet 500 mg  500 mg Oral BID WC Jacolyn Reedy, MD   500 mg at 05/19/14 0900  . metoprolol (LOPRESSOR) tablet 50 mg  50 mg Oral BID Jacolyn Reedy, MD   50 mg at 05/18/14 2312  . simvastatin (ZOCOR) tablet 20 mg  20 mg Oral q1800 Jacolyn Reedy, MD        Physical Exam: General appearance: alert and no distress Neck: no carotid bruit and no JVD Lungs: diminished breath sounds bilaterally Heart: regular rate and rhythm, S1, S2 normal and systolic murmur: systolic ejection 3/6, crescendo at 2nd right intercostal space Abdomen: morbidly obese Extremities: extremities normal, atraumatic, no cyanosis or edema Neurologic: Grossly normal  Lab Results: Results for orders placed during the hospital encounter of 05/18/14 (from the past 48 hour(s))  CBC WITH DIFFERENTIAL     Status: Abnormal   Collection Time    05/18/14  1:45 PM      Result Value Ref Range   WBC 15.2 (*) 4.0 - 10.5 K/uL   RBC 2.49 (*) 3.87 - 5.11 MIL/uL   Hemoglobin 7.3 (*) 12.0 - 15.0 g/dL   HCT  21.4 (*) 36.0 - 46.0 %   MCV 85.9  78.0 - 100.0 fL   MCH 29.3  26.0 - 34.0 pg   MCHC 34.1  30.0 - 36.0 g/dL   RDW 14.2  11.5 - 15.5 %   Platelets 203  150 - 400 K/uL   Neutrophils Relative % 81 (*) 43 - 77 %   Neutro Abs 12.4 (*) 1.7 - 7.7 K/uL   Lymphocytes Relative 12  12 - 46 %   Lymphs Abs 1.8  0.7 - 4.0 K/uL   Monocytes Relative 5  3 - 12 %   Monocytes Absolute 0.7  0.1 - 1.0 K/uL   Eosinophils Relative 2  0 - 5 %   Eosinophils Absolute 0.3  0.0 - 0.7 K/uL   Basophils Relative 0  0 - 1 %   Basophils Absolute 0.0  0.0 - 0.1 K/uL  BASIC METABOLIC PANEL     Status: Abnormal   Collection Time    05/18/14  1:45 PM      Result Value Ref Range   Sodium 142  137 - 147 mEq/L   Potassium 4.4  3.7 - 5.3 mEq/L   Chloride 108  96 - 112 mEq/L   CO2 23  19 - 32 mEq/L   Glucose, Bld 128 (*)  70 - 99 mg/dL   BUN 11  6 - 23 mg/dL   Creatinine, Ser 0.80  0.50 - 1.10 mg/dL   Calcium 7.8 (*) 8.4 - 10.5 mg/dL   GFR calc non Af Amer 88 (*) >90 mL/min   GFR calc Af Amer >90  >90 mL/min   Comment: (NOTE)     The eGFR has been calculated using the CKD EPI equation.     This calculation has not been validated in all clinical situations.     eGFR's persistently <90 mL/min signify possible Chronic Kidney     Disease.  TROPONIN I     Status: Abnormal   Collection Time    05/18/14  1:45 PM      Result Value Ref Range   Troponin I 0.48 (*) <0.30 ng/mL   Comment: CRITICAL RESULT CALLED TO, READ BACK BY AND VERIFIED WITH:     KENDRICK,J AT 2:27PM ON 05/18/14 BY FESTERMAN,C                Due to the release kinetics of cTnI,     a negative result within the first hours     of the onset of symptoms does not rule out     myocardial infarction with certainty.     If myocardial infarction is still suspected,     repeat the test at appropriate intervals.  PRO B NATRIURETIC PEPTIDE     Status: Abnormal   Collection Time    05/18/14  1:45 PM      Result Value Ref Range   Pro B Natriuretic peptide (BNP) 2656.0 (*) 0 - 125 pg/mL  URINALYSIS, ROUTINE W REFLEX MICROSCOPIC     Status: Abnormal   Collection Time    05/18/14  3:21 PM      Result Value Ref Range   Color, Urine YELLOW  YELLOW   APPearance CLEAR  CLEAR   Specific Gravity, Urine 1.015  1.005 - 1.030   pH 5.5  5.0 - 8.0   Glucose, UA NEGATIVE  NEGATIVE mg/dL   Hgb urine dipstick LARGE (*) NEGATIVE   Bilirubin Urine NEGATIVE  NEGATIVE   Ketones, ur NEGATIVE  NEGATIVE mg/dL  Protein, ur NEGATIVE  NEGATIVE mg/dL   Urobilinogen, UA 0.2  0.0 - 1.0 mg/dL   Nitrite NEGATIVE  NEGATIVE   Leukocytes, UA TRACE (*) NEGATIVE  URINE MICROSCOPIC-ADD ON     Status: None   Collection Time    05/18/14  3:21 PM      Result Value Ref Range   Squamous Epithelial / LPF RARE  RARE   WBC, UA 0-2  <3 WBC/hpf   RBC / HPF 21-50  <3 RBC/hpf  TYPE  AND SCREEN     Status: None   Collection Time    05/18/14  3:30 PM      Result Value Ref Range   ABO/RH(D) O POS     Antibody Screen NEG     Sample Expiration 05/21/2014    MRSA PCR SCREENING     Status: None   Collection Time    05/18/14  6:25 PM      Result Value Ref Range   MRSA by PCR NEGATIVE  NEGATIVE   Comment:            The GeneXpert MRSA Assay (FDA     approved for NASAL specimens     only), is one component of a     comprehensive MRSA colonization     surveillance program. It is not     intended to diagnose MRSA     infection nor to guide or     monitor treatment for     MRSA infections.  CBC WITH DIFFERENTIAL     Status: Abnormal   Collection Time    05/18/14  9:21 PM      Result Value Ref Range   WBC 17.9 (*) 4.0 - 10.5 K/uL   RBC 2.60 (*) 3.87 - 5.11 MIL/uL   Hemoglobin 7.5 (*) 12.0 - 15.0 g/dL   HCT 22.4 (*) 36.0 - 46.0 %   MCV 86.2  78.0 - 100.0 fL   MCH 28.8  26.0 - 34.0 pg   MCHC 33.5  30.0 - 36.0 g/dL   RDW 14.1  11.5 - 15.5 %   Platelets 239  150 - 400 K/uL   Neutrophils Relative % 80 (*) 43 - 77 %   Neutro Abs 14.4 (*) 1.7 - 7.7 K/uL   Lymphocytes Relative 13  12 - 46 %   Lymphs Abs 2.3  0.7 - 4.0 K/uL   Monocytes Relative 5  3 - 12 %   Monocytes Absolute 0.9  0.1 - 1.0 K/uL   Eosinophils Relative 2  0 - 5 %   Eosinophils Absolute 0.3  0.0 - 0.7 K/uL   Basophils Relative 0  0 - 1 %   Basophils Absolute 0.0  0.0 - 0.1 K/uL  TROPONIN I     Status: Abnormal   Collection Time    05/18/14  9:21 PM      Result Value Ref Range   Troponin I 0.32 (*) <0.30 ng/mL   Comment:            Due to the release kinetics of cTnI,     a negative result within the first hours     of the onset of symptoms does not rule out     myocardial infarction with certainty.     If myocardial infarction is still suspected,     repeat the test at appropriate intervals.     CRITICAL RESULT CALLED TO, READ BACK BY AND VERIFIED WITH:     ABERION S,RN  05/18/14 2211 WAYK    GLUCOSE, CAPILLARY     Status: Abnormal   Collection Time    05/18/14  9:32 PM      Result Value Ref Range   Glucose-Capillary 124 (*) 70 - 99 mg/dL  BASIC METABOLIC PANEL     Status: Abnormal   Collection Time    05/19/14  1:54 AM      Result Value Ref Range   Sodium 140  137 - 147 mEq/L   Potassium 4.0  3.7 - 5.3 mEq/L   Chloride 102  96 - 112 mEq/L   CO2 19  19 - 32 mEq/L   Glucose, Bld 160 (*) 70 - 99 mg/dL   BUN 10  6 - 23 mg/dL   Creatinine, Ser 0.88  0.50 - 1.10 mg/dL   Calcium 8.0 (*) 8.4 - 10.5 mg/dL   GFR calc non Af Amer 78 (*) >90 mL/min   GFR calc Af Amer >90  >90 mL/min   Comment: (NOTE)     The eGFR has been calculated using the CKD EPI equation.     This calculation has not been validated in all clinical situations.     eGFR's persistently <90 mL/min signify possible Chronic Kidney     Disease.  TROPONIN I     Status: Abnormal   Collection Time    05/19/14  1:54 AM      Result Value Ref Range   Troponin I 0.32 (*) <0.30 ng/mL   Comment:            Due to the release kinetics of cTnI,     a negative result within the first hours     of the onset of symptoms does not rule out     myocardial infarction with certainty.     If myocardial infarction is still suspected,     repeat the test at appropriate intervals.     CRITICAL VALUE NOTED.  VALUE IS CONSISTENT WITH PREVIOUSLY REPORTED AND CALLED VALUE.  GLUCOSE, CAPILLARY     Status: Abnormal   Collection Time    05/19/14  7:40 AM      Result Value Ref Range   Glucose-Capillary 128 (*) 70 - 99 mg/dL  TROPONIN I     Status: Abnormal   Collection Time    05/19/14  7:54 AM      Result Value Ref Range   Troponin I 0.32 (*) <0.30 ng/mL   Comment:            Due to the release kinetics of cTnI,     a negative result within the first hours     of the onset of symptoms does not rule out     myocardial infarction with certainty.     If myocardial infarction is still suspected,     repeat the test at appropriate  intervals.     CRITICAL VALUE NOTED.  VALUE IS CONSISTENT WITH PREVIOUSLY REPORTED AND CALLED VALUE.    Imaging: Dg Chest Port 1 View  05/18/2014   CLINICAL DATA:  Chest pain and shortness of breath  EXAM: PORTABLE CHEST - 1 VIEW  COMPARISON:  03/05/2014  FINDINGS: Cardiac shadow is mildly enlarged. Diffuse vascular congestion and parenchymal opacities are noted consistent with congestive failure. No pneumothorax or sizable effusion is seen.  IMPRESSION: Changes consistent with congestive failure.   Electronically Signed   By: Inez Catalina M.D.   On: 05/18/2014 14:33    Assessment:  1. Principal Problem:  2.   Acute on chronic diastolic congestive heart failure 3. Active Problems: 4.   Acute blood loss anemia 5.   HOCM (hypertrophic obstructive cardiomyopathy) 6.   Plan:  1. Generally feels unwell today, but not clear why. Low grade temp <100F overnight - feels hot. Urine demonstrate no signs of infection but large amount of hemoglobin. No clear sign of infection ?low grade transfusion reaction (since she had so many units of PRBC's), ?infection. She has an elevated white count. Add tylenol - okay to transfer out of stepdown to telemetry floor. May need medicine consult.  Time Spent Directly with Patient:  15 minutes  Length of Stay:  LOS: 1 day   Pixie Casino, MD, [facility] Attending Cardiologist [facility]  Fernie Grimm C 05/19/2014, 10:17 AM

## 2014-05-19 NOTE — Care Management Note (Addendum)
    Page 1 of 1   05/26/2014     2:17:16 PM CARE MANAGEMENT NOTE 05/26/2014  Patient:  Erin Sexton, Erin Sexton   Account Number:  [id number]  Date Initiated:  05/19/2014  Documentation initiated by:  Elissa Hefty  Subjective/Objective Assessment:   Pt adm with CHF, chest pain.     Action/Plan:   lives w fam   Anticipated DC Date:  05/23/2014   Anticipated DC Plan:  [place]  CM consult      Choice offered to / List presented to:             Status of service:  Completed, signed off Medicare Important Message given?  YES (If response is "NO", the following Medicare IM given date fields will be blank) Date Medicare IM given:  05/24/2014 Date Additional Medicare IM given:    Discharge Disposition:  IP REHAB FACILITY  Per UR Regulation:  Reviewed for med. necessity/level of care/duration of stay  If discussed at [place] of Stay Meetings, dates discussed:   05/24/2014    Comments:  05/25/14 Ellan Lambert, RN, BSN [phone] Pt discharged to [facility] IP rehab today.  05/24/14 Ellan Lambert, RN, BSN [phone] PT recommending CIR, and rehab c/s in progress.

## 2014-05-19 NOTE — Progress Notes (Signed)
Pt appears very anxious with HR in the 130's-140's. She states she feels short of breath and "her lungs hurt".  Lung sounds are clear and diminished. She also states she is nauseous, and has a dry cough. Notified Dr. Elias Else and new orders for Ambien, Zofran and lopressor given. Also notified MD of increased temperature, greater than 100. Will continue to monitor.

## 2014-05-19 NOTE — Progress Notes (Signed)
Pt stable upon transfer to unit. Family at bedside. Call bell placed within reach, pt oriented to room. VS obtained, and assessment complete. Pt stable upon transfer.

## 2014-05-19 NOTE — Progress Notes (Signed)
CRITICAL VALUE ALERT  Critical value received:  Troponin 0.32  Date of notification:  05/18/14  Time of notification:  2130  Critical value read back:yes  Nurse who received alert:  Dennison Mascot  MD notified (1st page):  N/a; expected value

## 2014-05-20 DIAGNOSIS — N390 Urinary tract infection, site not specified: Secondary | ICD-10-CM | POA: Diagnosis not present

## 2014-05-20 DIAGNOSIS — R079 Chest pain, unspecified: Secondary | ICD-10-CM

## 2014-05-20 DIAGNOSIS — E876 Hypokalemia: Secondary | ICD-10-CM | POA: Insufficient documentation

## 2014-05-20 DIAGNOSIS — T83511A Infection and inflammatory reaction due to indwelling urethral catheter, initial encounter: Secondary | ICD-10-CM

## 2014-05-20 DIAGNOSIS — D649 Anemia, unspecified: Secondary | ICD-10-CM

## 2014-05-20 LAB — BASIC METABOLIC PANEL
BUN: 13 mg/dL (ref 6–23)
CO2: 24 mEq/L (ref 19–32)
Calcium: 7.8 mg/dL — ABNORMAL LOW (ref 8.4–10.5)
Chloride: 106 mEq/L (ref 96–112)
Creatinine, Ser: 0.83 mg/dL (ref 0.50–1.10)
GFR calc non Af Amer: 84 mL/min — ABNORMAL LOW (ref >90)
GLUCOSE: 145 mg/dL — AB (ref 70–99)
Potassium: 3.5 mEq/L — ABNORMAL LOW (ref 3.7–5.3)
SODIUM: 144 meq/L (ref 137–147)

## 2014-05-20 LAB — URINALYSIS, ROUTINE W REFLEX MICROSCOPIC
Glucose, UA: NEGATIVE mg/dL
Ketones, ur: 15 mg/dL — AB
Nitrite: POSITIVE — AB
Protein, ur: >300 mg/dL — AB
Specific Gravity, Urine: 1.024 (ref 1.005–1.030)
UROBILINOGEN UA: 4 mg/dL — AB (ref 0.0–1.0)
pH: 5.5 (ref 5.0–8.0)

## 2014-05-20 LAB — GLUCOSE, CAPILLARY
GLUCOSE-CAPILLARY: 117 mg/dL — AB (ref 70–99)
Glucose-Capillary: 136 mg/dL — ABNORMAL HIGH (ref 70–99)
Glucose-Capillary: 115 mg/dL — ABNORMAL HIGH (ref 70–99)
Glucose-Capillary: 119 mg/dL — ABNORMAL HIGH (ref 70–99)

## 2014-05-20 LAB — URINE MICROSCOPIC-ADD ON

## 2014-05-20 LAB — CBC
HCT: 22.7 % — ABNORMAL LOW (ref 36.0–46.0)
HEMOGLOBIN: 7.4 g/dL — AB (ref 12.0–15.0)
MCH: 28.9 pg (ref 26.0–34.0)
MCHC: 32.6 g/dL (ref 30.0–36.0)
MCV: 88.7 fL (ref 78.0–100.0)
Platelets: 239 10*3/uL (ref 150–400)
RBC: 2.56 MIL/uL — ABNORMAL LOW (ref 3.87–5.11)
RDW: 15.8 % — AB (ref 11.5–15.5)
WBC: 11.4 10*3/uL — ABNORMAL HIGH (ref 4.0–10.5)

## 2014-05-20 MED ORDER — METOPROLOL TARTRATE 25 MG PO TABS
25.0000 mg | ORAL_TABLET | Freq: Two times a day (BID) | ORAL | Status: DC
  Administered 2014-05-20 – 2014-05-25 (×10): 25 mg via ORAL
  Filled 2014-05-20 (×12): qty 1

## 2014-05-20 MED ORDER — PHENAZOPYRIDINE HCL 200 MG PO TABS
200.0000 mg | ORAL_TABLET | Freq: Three times a day (TID) | ORAL | Status: AC
  Administered 2014-05-20 – 2014-05-22 (×5): 200 mg via ORAL
  Filled 2014-05-20 (×6): qty 1

## 2014-05-20 MED ORDER — VERAPAMIL HCL ER 120 MG PO TBCR
120.0000 mg | EXTENDED_RELEASE_TABLET | Freq: Every day | ORAL | Status: DC
  Administered 2014-05-20: 120 mg via ORAL
  Filled 2014-05-20 (×2): qty 1

## 2014-05-20 MED ORDER — DEXTROSE 5 % IV SOLN
1.0000 g | INTRAVENOUS | Status: DC
  Administered 2014-05-20 – 2014-05-22 (×3): 1 g via INTRAVENOUS
  Filled 2014-05-20 (×4): qty 10

## 2014-05-20 MED ORDER — ATORVASTATIN CALCIUM 10 MG PO TABS
10.0000 mg | ORAL_TABLET | Freq: Every day | ORAL | Status: DC
  Administered 2014-05-21 – 2014-05-25 (×5): 10 mg via ORAL
  Filled 2014-05-20 (×6): qty 1

## 2014-05-20 MED ORDER — POTASSIUM CHLORIDE CRYS ER 20 MEQ PO TBCR
40.0000 meq | EXTENDED_RELEASE_TABLET | Freq: Once | ORAL | Status: AC
  Administered 2014-05-20: 40 meq via ORAL
  Filled 2014-05-20: qty 2

## 2014-05-20 NOTE — Progress Notes (Signed)
Foley d/c ar 1830 pt tolerated well.

## 2014-05-20 NOTE — Progress Notes (Signed)
Patient Name: Erin Sexton Date of Encounter: 05/20/2014     Principal Problem:   Acute on chronic diastolic congestive heart failure Active Problems:   Acute blood loss anemia   HOCM (hypertrophic obstructive cardiomyopathy)    SUBJECTIVE  Intermittent chest pain that is chronic and thought to be MSK. SOB improving, but still tachypnic. Complaining of burning, freq and urgency with urination. With catheter with frank blood.   CURRENT MEDS . buPROPion  150 mg Oral Daily  . ferrous sulfate  325 mg Oral BID WC  . fluticasone  2 puff Inhalation BID  . furosemide  40 mg Oral Daily  . insulin aspart  0-20 Units Subcutaneous TID WC  . levothyroxine  25 mcg Oral QAC breakfast  . megestrol  160 mg Oral Daily  . metFORMIN  500 mg Oral BID WC  . metoprolol tartrate  50 mg Oral BID  . simvastatin  20 mg Oral q1800    OBJECTIVE  Filed Vitals:   05/19/14 1818 05/19/14 2027 05/19/14 2125 05/20/14 0500  BP: 123/71 113/62  125/78  Pulse: 110 92  91  Temp:  98 F (36.7 C)  98.8 F (37.1 C)  TempSrc:  Oral  Oral  Resp: 29 20  20   Height:      Weight:      SpO2: 98% 95% 93% 94%    Intake/Output Summary (Last 24 hours) at 05/20/14 0824 Last data filed at 05/19/14 1751  Gross per 24 hour  Intake    240 ml  Output   3700 ml  Net  -3460 ml   Filed Weights   05/18/14 1140 05/18/14 1832 05/19/14 0437  Weight: 380 lb (172.367 kg) 400 lb 5.7 oz (181.6 kg) 399 lb 4.1 oz (181.1 kg)    PHYSICAL EXAM  General: Pleasant. Super morbidly obese. She appears very uncomfortable and grunting with discomfort but in no acute distress. Neuro: Alert and oriented X 3. Moves all extremities spontaneously. Psych: Normal affect. HEENT:  Normal  Neck: Supple without bruits or JVD. Lungs:  Resp regular. Distant lung sounds due to body habitus Heart: RRR no s3, s4, + murmur Abdomen: Soft, non-tender, non-distended, BS + x 4. Large pannus. Foley with frank blood Extremities: No clubbing,  cyanosis or edema. DP/PT/Radials 2+ and equal bilaterally.  Accessory Clinical Findings  CBC  Recent Labs  05/18/14 1345 05/18/14 2121  WBC 15.2* 17.9*  NEUTROABS 12.4* 14.4*  HGB 7.3* 7.5*  HCT 21.4* 22.4*  MCV 85.9 86.2  PLT 203 124   Basic Metabolic Panel  Recent Labs  05/18/14 1345 05/19/14 0154  NA 142 140  K 4.4 4.0  CL 108 102  CO2 23 19  GLUCOSE 128* 160*  BUN 11 10  CREATININE 0.80 0.88  CALCIUM 7.8* 8.0*   Cardiac Enzymes  Recent Labs  05/18/14 2121 05/19/14 0154 05/19/14 0754  TROPONINI 0.32* 0.32* 0.32*   Hemoglobin A1C  Recent Labs  05/19/14 0154  HGBA1C 5.7*   TELE  NSR  Radiology/Studies  US Transvaginal Non-ob  21-May-2014   CLINICAL DATA:  Vaginal bleeding since March 2015.  EXAM: TRANSABDOMINAL AND TRANSVAGINAL ULTRASOUND OF PELVIS  TECHNIQUE: Both transabdominal and transvaginal ultrasound examinations of the pelvis were performed. Transabdominal technique was performed for global imaging of the pelvis including uterus, ovaries, adnexal regions, and pelvic cul-de-sac. It was necessary to proceed with endovaginal exam following the transabdominal exam to visualize the endometrium and ovaries.  COMPARISON:  03/06/2014  FINDINGS: Examination is somewhat limited due  to patient body habitus.  Uterus  Measurements: 6.9 x 7.8 x 12.0 cm. No fibroids or other mass visualized.  Endometrium  Thickness: 27 mm (previously measured 15 mm). Thickened a moderately heterogeneous likely due in part to hemorrhagic debris in this patient with moderate vaginal bleeding. Could not exclude an endometrial mass/polyp or hyperplasia.  Right ovary  Not visualized.  Left ovary  Not visualized.  Other findings  There is a large heterogeneous somewhat hyperechoic mass centered over the cervix measuring 6 x 6 x 6.9 cm.  IMPRESSION: Slightly enlarged uterus with moderately thickened heterogeneous endometrium measuring 27 mm which has increased in thickness compared to the  prior exam. This is likely due in part to hemorrhagic debris in this patient with moderate vaginal bleeding, although cannot exclude a mass. New large heterogeneous echogenic masslike area centered over the cervix measuring 6 x 6 x 6.9 cm likely a large region of clot. Recommend GYN consultation.  If bleeding remains unresponsive to hormonal or medical therapy, focal lesion work-up with sonohysterogram should be considered. Endometrial biopsy should also be considered in pre-menopausal patients at high risk for endometrial carcinoma. (Ref: Radiological Reasoning: Algorithmic Workup of Abnormal Vaginal Bleeding with Endovaginal Sonography and Sonohysterography. AJR 2008; 147:W29-56)  Nonvisualization of the ovaries.   Electronically Signed   By: Marin Olp M.D.   On: 05/15/2014 07:19   US Pelvis Complete  05/15/2014   CLINICAL DATA:  Vaginal bleeding since March 2015.  EXAM: TRANSABDOMINAL AND TRANSVAGINAL ULTRASOUND OF PELVIS  TECHNIQUE: Both transabdominal and transvaginal ultrasound examinations of the pelvis were performed. Transabdominal technique was performed for global imaging of the pelvis including uterus, ovaries, adnexal regions, and pelvic cul-de-sac. It was necessary to proceed with endovaginal exam following the transabdominal exam to visualize the endometrium and ovaries.  COMPARISON:  03/06/2014  FINDINGS: Examination is somewhat limited due to patient body habitus.  Uterus  Measurements: 6.9 x 7.8 x 12.0 cm. No fibroids or other mass visualized.  Endometrium  Thickness: 27 mm (previously measured 15 mm). Thickened a moderately heterogeneous likely due in part to hemorrhagic debris in this patient with moderate vaginal bleeding. Could not exclude an endometrial mass/polyp or hyperplasia.  Right ovary  Not visualized.  Left ovary  Not visualized.  Other findings  There is a large heterogeneous somewhat hyperechoic mass centered over the cervix measuring 6 x 6 x 6.9 cm.  IMPRESSION: Slightly  enlarged uterus with moderately thickened heterogeneous endometrium measuring 27 mm which has increased in thickness compared to the prior exam. This is likely due in part to hemorrhagic debris in this patient with moderate vaginal bleeding, although cannot exclude a mass. New large heterogeneous echogenic masslike area centered over the cervix measuring 6 x 6 x 6.9 cm likely a large region of clot. Recommend GYN consultation.  If bleeding remains unresponsive to hormonal or medical therapy, focal lesion work-up with sonohysterogram should be considered. Endometrial biopsy should also be considered in pre-menopausal patients at high risk for endometrial carcinoma. (Ref: Radiological Reasoning: Algorithmic Workup of Abnormal Vaginal Bleeding with Endovaginal Sonography and Sonohysterography. AJR 2008; 213:Y86-57)  Nonvisualization of the ovaries.   Electronically Signed   By: Marin Olp M.D.   On: 05/15/2014 07:19   Dg Chest Port 1 View  05/18/2014   CLINICAL DATA:  Chest pain and shortness of breath  EXAM: PORTABLE CHEST - 1 VIEW  COMPARISON:  03/05/2014  FINDINGS: Cardiac shadow is mildly enlarged. Diffuse vascular congestion and parenchymal opacities are noted consistent with congestive  failure. No pneumothorax or sizable effusion is seen.  IMPRESSION: Changes consistent with congestive failure.   Electronically Signed   By: Inez Catalina M.D.   On: 05/18/2014 14:33    ASSESSMENT AND PLAN  This 43 year-old female with a hx of HOCM, COPD, DM, morbid obesity, SVT, NSVT and severe menorrhagia recently. She was admitted in 02/2014 with dyspnea and chest pain. CT of the chest was felt to demonstrate possible small bilateral pulmonary emboli. She was started on Xarelto with plans to anticoagulate for 3-6 mos. She was then readmitted to 3/28-3/31 with vaginal bleeding and profound anemia. She required transfusion with 4 units PRBCs. She was seen by pulmonology due to recent history of possible PE. Chest CT was  reviewed and did not demonstrate evidence of pulmonary embolism. VQ scan was negative. Therefore, it was felt that she did not have a pulmonary embolism and anticoagulation was discontinued. She was admitted again just a few days ago with to recurrent profuse vaginal bleeding and received transfusion of 6 units  and was sent home 05/17/14 but remained short of breath and returned to [facility] later that night with worsening dyspnea. She was found to have acute pulmonary edema and reated with BiPAP and IV Lasix. She has a very mild elevation of troponin and was transferred to [facility] for further management.   Acute on chronic diastolic CHF- Possibly exacerbated by volume of recent transfusion with 6 units in the setting of HOCM.  -- Neg 6.5L; 400 lbs--> 399 lbs today -- BNP 2.7K but can be falsely low in the setting of obesity. -- On Lasix 40mg  po qd. Given one dose of IV Lasix 80mg . SOB slightly improved -- Fluids accidentally run through last night. These were discontinued early this AM. Will require additional lasix. Kidney function is normal.   Elevated troponin - explained in the setting of CHF (0.32) --She has severe morbid obesity and was not felt to be a good candidate for noninvasive or invasive testing and has been managed conservatively. -- Continue BB and statin -- Complains of chest pain that is not new and thought to be MSK earlier this admission.  Anemia- She is still significantly anemic. Last hg 7.5. No new labs. Will order repeat and daily labs.  -- Ferratin 9. Iron 15.  -- Vaginal bleeding is being managed with Premarin, Megace and iron.   Vaginal bleeding- not actively bleeding. Seen by Dr. Elly Modena with OB/GYN on 05/14/14 and placed on  Premarin and Megace and iron.  -- Continue to monitor  Leukocytosis- 17.9 on 05/18/14. No new labs. Will repeat. UA with no infection but large amounts of Hgb. Will have IM consult  HOCM  -- 2-D echocardiogram done on 3/29 and on beta blocker   DM- Hg  A1c 5.7  -- Continue metformin   HLD- continue statin   Dysuria- complains of burning urination, frequency and urgency. With catheter and there is frank blood in urine.   -- Last UA w/ no infection but large amounts of Hgb. Will repeat. -- Consult IM for possible ABX   SignedPerry Mount PA-C  Pager [phone]

## 2014-05-20 NOTE — Consult Note (Signed)
[facility] Medical Consultation  Erin Sexton HAL:[id number] DOB: [birth year] DOA: 05/18/2014 PCP: Oren Binet, MD   Requesting physician: Pernell Dupre Date of consultation: 05/20/14 Reason for consultation: dysuria, hematuria, malaise  Impression/Recommendations  Catheter associated UTI:  UA negative on admission except blood. Today's consistent with UTI. D/C foley. Ceftriaxone. Urine culture. Pyridium. Hypokalemia: replete ABLA: hgb 7.5. Consider transfusing another unit PRBC. Defer to cardiology Menorrhagia: has stopped. Missed appointment with gyn today. Will need f/u at discharge Morbid obesity Acute on chronic diastolic CHF HOCM  TRH to follow. Thank you for this consultation.  Chief Complaint: burning with urination  HPI:  36 F admitted to cardiology service 05/18/14 with elevated troponin, acute CHF. Has had menorrhagia requiring admission for transfusion recently. Has had foley catheter in place for several days. A few days ago, gross hematuria noted in bag. Today, c/o severe dysuria. Yesterday, had generalized malaise. No longer having vaginal bleeding. Dyspnea has improved.  THR consulted for dysuria, hematuria. Repeat UA has just been resulted. Shows UTI  Review of Systems:  Systems reviewed. As above, otherwise negative.  Past Medical History  Diagnosis Date  . COPD (chronic obstructive pulmonary disease)   . Depression   . Hypertension   . Asthma   . Anxiety   . Anemia   . Blood transfusion without reported diagnosis   . Diabetes mellitus without complication     a. Dx 02/2014.  [name] HOCM (hypertrophic obstructive cardiomyopathy)     a. Dx 73532.  . Vaginal bleeding   . SVT (supraventricular tachycardia)     a. Noted on tele during 02/2014 adm for symptomatic anemia.  [name] NSVT (nonsustained ventricular tachycardia)     a. Noted on tele during 02/2014 adm for symptomatic anemia.  . Morbid obesity   . Abnormal CT scan     a. 02/2014: evaluated for dyspnea  and initially placed on Xarelto for PE because radiologist could not exclude small peripheral pulmonary emboli because of limited contrast. However, after admission for ++vaginal bleeding several days later, it was felt she did NOT have PE - underwent further testing with neg VQ 03/06/14, neg LE duplex.   History reviewed. No pertinent past surgical history. Social History:  reports that she has never smoked. She has never used smokeless tobacco. She reports that she does not drink alcohol or use illicit drugs.  No Known Allergies Family History  Problem Relation Age of Onset  . CAD Neg Hx     No family history of EARLY CAD    Prior to Admission medications   Medication Sig Start Date End Date Taking? Authorizing Provider  albuterol (PROVENTIL HFA;VENTOLIN HFA) 108 (90 BASE) MCG/ACT inhaler Inhale 2 puffs into the lungs every 6 (six) hours as needed for wheezing or shortness of breath.   Yes Historical Provider, MD  albuterol (PROVENTIL) (2.5 MG/3ML) 0.083% nebulizer solution Take 3 mLs (2.5 mg total) by nebulization every 6 (six) hours as needed for wheezing or shortness of breath. 03/03/14  Yes Kathie Dike, MD  buPROPion (WELLBUTRIN XL) 150 MG 24 hr tablet Take 1 tablet (150 mg total) by mouth daily. 03/03/14  Yes Kathie Dike, MD  ferrous sulfate 325 (65 FE) MG tablet Take 1 tablet (325 mg total) by mouth 2 (two) times daily with a meal. 03/08/14  Yes Shanker Kristeen Mans, MD  fluticasone (FLOVENT HFA) 110 MCG/ACT inhaler Inhale 2 puffs into the lungs 2 (two) times daily. 03/03/14  Yes Kathie Dike, MD  levothyroxine (SYNTHROID, LEVOTHROID) 25 MCG tablet Take  25 mcg by mouth daily before breakfast.   Yes Historical Provider, MD  megestrol (MEGACE) 40 MG tablet Take 4 tablets (160 mg total) by mouth daily. 05/17/14  Yes Peggy Constant, MD  metFORMIN (GLUCOPHAGE) 500 MG tablet Take 1 tablet (500 mg total) by mouth 2 (two) times daily with a meal. 03/08/14  Yes Shanker Kristeen Mans, MD  pravastatin  (PRAVACHOL) 20 MG tablet Take 1 tablet (20 mg total) by mouth daily. 03/03/14  Yes Kathie Dike, MD   Physical Exam: Blood pressure 125/78, pulse 91, temperature 98.8 F (37.1 C), temperature source Oral, resp. rate 20, height 5\' 9"  (1.753 m), weight 181.1 kg (399 lb 4.1 oz), SpO2 94.00%. Filed Vitals:   05/20/14 0500  BP: 125/78  Pulse: 91  Temp: 98.8 F (37.1 C)  Resp: 20   BP 125/78  Pulse 91  Temp(Src) 98.8 F (37.1 C) (Oral)  Resp 20  Ht 5\' 9"  (1.753 m)  Wt 181.1 kg (399 lb 4.1 oz)  BMI 58.93 kg/m2  SpO2 94%  General Appearance:    Alert, cooperative. Extreme mobid obesity  Head:    Normocephalic, without obvious abnormality, atraumatic  Eyes:    PERRL, conjunctiva/corneas clear, EOM's intact, fundi    benign, both eyes  Ears:    Normal TM's and external ear canals, both ears  Nose:   Nares normal, septum midline, mucosa normal, no drainage    or sinus tenderness  Throat:   Lips, mucosa, and tongue normal; teeth and gums normal  Neck:   Supple, thick, no La or carotid bruit  Back:     Symmetric, no curvature, ROM normal, no CVA tenderness  Lungs:     Clear to auscultation bilaterally, respirations unlabored  Chest Wall:    No tenderness or deformity   Heart:    Regular rate and rhythm, S1 and S2 normal, no murmur, rub   or gallop     Abdomen:     Soft, non-tender,obese, peau d'orange  Genitalia:    Foley with cloudy urine and clots. No candida noted  Rectal:    deferred  Extremities:   1+ edema  Pulses:   2+ and symmetric all extremities  Skin:   Skin color, texture, turgor normal, no rashes or lesions  Lymph nodes:   Cervical, supraclavicular, and axillary nodes normal  Neurologic:   CNII-XII intact, normal strength, sensation and reflexes    throughout     Psych: normal affect  Labs on Admission:  Basic Metabolic Panel:  Recent Labs Lab 05/15/14 0456 05/16/14 0045 05/18/14 1345 05/19/14 0154 05/20/14 0921  NA 144 141 142 140 144  K 4.4 4.1 4.4 4.0  3.5*  CL 108 108 108 102 106  CO2 23 22 23 19 24   GLUCOSE 167* 180* 128* 160* 145*  BUN 15 19 11 10 13   CREATININE 0.83 0.91 0.80 0.88 0.83  CALCIUM 8.6 7.9* 7.8* 8.0* 7.8*   Liver Function Tests: No results found for this basename: AST, ALT, ALKPHOS, BILITOT, PROT, ALBUMIN,  in the last 168 hours No results found for this basename: LIPASE, AMYLASE,  in the last 168 hours No results found for this basename: AMMONIA,  in the last 168 hours CBC:  Recent Labs Lab 05/14/14 2053  05/16/14 1640 05/17/14 0718 05/18/14 1345 05/18/14 2121 05/20/14 0921  WBC 11.5*  < > 16.8* 14.4* 15.2* 17.9* 11.4*  NEUTROABS 8.0*  --   --   --  12.4* 14.4*  --   HGB 10.6*  < >  7.5* 7.8* 7.3* 7.5* 7.4*  HCT 33.0*  < > 21.9* 22.7* 21.4* 22.4* 22.7*  MCV 84.2  < > 83.3 83.5 85.9 86.2 88.7  PLT 311  < > 238 194 203 239 239  < > = values in this interval not displayed. Cardiac Enzymes:  Recent Labs Lab 05/18/14 1345 05/18/14 2121 05/19/14 0154 05/19/14 0754  TROPONINI 0.48* 0.32* 0.32* 0.32*   BNP: No components found with this basename: POCBNP,  CBG:  Recent Labs Lab 05/19/14 1146 05/19/14 1616 05/19/14 2115 05/20/14 0558 05/20/14 1150  GLUCAP 135* 106* 135* 136* 115*    Radiological Exams on Admission: Dg Chest Port 1 View  05/18/2014   CLINICAL DATA:  Chest pain and shortness of breath  EXAM: PORTABLE CHEST - 1 VIEW  COMPARISON:  03/05/2014  FINDINGS: Cardiac shadow is mildly enlarged. Diffuse vascular congestion and parenchymal opacities are noted consistent with congestive failure. No pneumothorax or sizable effusion is seen.  IMPRESSION: Changes consistent with congestive failure.   Electronically Signed   By: Inez Catalina M.D.   On: 05/18/2014 14:33    Delfina Redwood, MD [facility] Pager [phone]  If 7PM-7AM, please contact night-coverage [email or web address] Password [facility] 05/20/2014, 1:04 PM

## 2014-05-20 NOTE — Progress Notes (Addendum)
The patient's chart has been reviewed including laboratory data, history, and exam . The patient's case and management have been discussed with Ms. Vertell Limber. I agree with that note as written. My clinical suspicion is that she has acute on chronic diastolic heart failure related to volume excess from multiple transfusions. We need to aggressively diurese(being careful not to precipitate LV outflow obstruction), and get her on a strong regimen for management of her hypertrophic obstructive cardiomyopathy. This should include negative inotropic calcium channel blocker.  We should be careful with beta blocker therapy due to asthma.  The chest pain that she has sounds musculoskeletal and nonischemic. I do not believe she needs coronary angiography or an ischemic evaluation.  The anemia is due to acute blood loss, and I goal should be to maintain a hemoglobin greater than 8 if possible.  Their other Gen. Internal Medicine issues at play and should be evaluated by the hospitalist service   As a complicated clinical situation requiring prolonged evaluation and assessment including communicating with multiple other health care providers.

## 2014-05-21 DIAGNOSIS — E876 Hypokalemia: Secondary | ICD-10-CM

## 2014-05-21 DIAGNOSIS — N390 Urinary tract infection, site not specified: Secondary | ICD-10-CM

## 2014-05-21 LAB — BASIC METABOLIC PANEL
BUN: 13 mg/dL (ref 6–23)
CHLORIDE: 103 meq/L (ref 96–112)
CO2: 26 meq/L (ref 19–32)
Calcium: 8.2 mg/dL — ABNORMAL LOW (ref 8.4–10.5)
Creatinine, Ser: 0.87 mg/dL (ref 0.50–1.10)
GFR calc Af Amer: >90 mL/min (ref >90)
GFR calc non Af Amer: 79 mL/min — ABNORMAL LOW (ref >90)
Glucose, Bld: 106 mg/dL — ABNORMAL HIGH (ref 70–99)
Potassium: 3.5 mEq/L — ABNORMAL LOW (ref 3.7–5.3)
Sodium: 143 mEq/L (ref 137–147)

## 2014-05-21 LAB — CBC
HCT: 22.3 % — ABNORMAL LOW (ref 36.0–46.0)
Hemoglobin: 7.2 g/dL — ABNORMAL LOW (ref 12.0–15.0)
MCH: 28.8 pg (ref 26.0–34.0)
MCHC: 32.3 g/dL (ref 30.0–36.0)
MCV: 89.2 fL (ref 78.0–100.0)
PLATELETS: 279 10*3/uL (ref 150–400)
RBC: 2.5 MIL/uL — AB (ref 3.87–5.11)
RDW: 16.2 % — ABNORMAL HIGH (ref 11.5–15.5)
WBC: 10.7 10*3/uL — AB (ref 4.0–10.5)

## 2014-05-21 LAB — GLUCOSE, CAPILLARY
GLUCOSE-CAPILLARY: 102 mg/dL — AB (ref 70–99)
Glucose-Capillary: 103 mg/dL — ABNORMAL HIGH (ref 70–99)
Glucose-Capillary: 107 mg/dL — ABNORMAL HIGH (ref 70–99)
Glucose-Capillary: 120 mg/dL — ABNORMAL HIGH (ref 70–99)

## 2014-05-21 MED ORDER — ONDANSETRON HCL 4 MG/2ML IJ SOLN
4.0000 mg | Freq: Three times a day (TID) | INTRAMUSCULAR | Status: DC | PRN
  Administered 2014-05-21 (×2): 4 mg via INTRAVENOUS
  Filled 2014-05-21 (×2): qty 2

## 2014-05-21 MED ORDER — VERAPAMIL HCL ER 180 MG PO TBCR: 180.0000 mg | EXTENDED_RELEASE_TABLET | Freq: Every day | ORAL | Status: DC

## 2014-05-21 MED ORDER — VERAPAMIL HCL ER 180 MG PO TBCR
180.0000 mg | EXTENDED_RELEASE_TABLET | Freq: Every day | ORAL | Status: DC
  Administered 2014-05-21 – 2014-05-25 (×5): 180 mg via ORAL
  Filled 2014-05-21 (×6): qty 1

## 2014-05-21 NOTE — Progress Notes (Addendum)
PROGRESS NOTE  Erin Sexton RPR:[id number] DOB: Feb 23, 1971 DOA: 05/18/2014 PCP: Oren Binet, MD  Assessment/Plan:  Catheter associated UTI: UA negative on admission except blood. U/A now consistent with UTI. D/C foley. Ceftriaxone. Urine culture pending. Pyridium.   Hypokalemia: replete   Leukocytosis -trending down  ABLA: hgb 7.5. Consider transfusing another unit PRBC. Defer to cardiology   Menorrhagia: has stopped. Missed appointment with gyn today. Will need f/u at discharge   Morbid obesity   Acute on chronic diastolic CHF   HOCM  Code Status: full Family Communication: patient Disposition Plan: per primary    HPI/Subjective: Feeling better No burning with urination  Objective: Filed Vitals:   05/21/14 0525  BP: 135/54  Pulse: 79  Temp: 97.8 F (36.6 C)  Resp: 20    Intake/Output Summary (Last 24 hours) at 05/21/14 4462 Last data filed at 05/20/14 2200  Gross per 24 hour  Intake    240 ml  Output      0 ml  Net    240 ml   Filed Weights   05/18/14 1832 05/19/14 0437 05/21/14 0525  Weight: 181.6 kg (400 lb 5.7 oz) 181.1 kg (399 lb 4.1 oz) 174 kg (383 lb 9.6 oz)    Exam:   General:  A+Ox3, NAD  Cardiovascular: rrr  Respiratory: clear  Abdomen: obese, +BS, soft  Musculoskeletal: moves all 4 ext  Data Reviewed: Basic Metabolic Panel:  Recent Labs Lab 05/16/14 0045 05/18/14 1345 05/19/14 0154 05/20/14 0921 05/21/14 0405  NA 141 142 140 144 143  K 4.1 4.4 4.0 3.5* 3.5*  CL 108 108 102 106 103  CO2 22 23 19 24 26   GLUCOSE 180* 128* 160* 145* 106*  BUN 19 11 10 13 13   CREATININE 0.91 0.80 0.88 0.83 0.87  CALCIUM 7.9* 7.8* 8.0* 7.8* 8.2*   Liver Function Tests: No results found for this basename: AST, ALT, ALKPHOS, BILITOT, PROT, ALBUMIN,  in the last 168 hours No results found for this basename: LIPASE, AMYLASE,  in the last 168 hours No results found for this basename: AMMONIA,  in the last 168 hours CBC:  Recent  Labs Lab 05/14/14 2053  05/17/14 0718 05/18/14 1345 05/18/14 2121 05/20/14 0921 05/21/14 0405  WBC 11.5*  < > 14.4* 15.2* 17.9* 11.4* 10.7*  NEUTROABS 8.0*  --   --  12.4* 14.4*  --   --   HGB 10.6*  < > 7.8* 7.3* 7.5* 7.4* 7.2*  HCT 33.0*  < > 22.7* 21.4* 22.4* 22.7* 22.3*  MCV 84.2  < > 83.5 85.9 86.2 88.7 89.2  PLT 311  < > 194 203 239 239 279  < > = values in this interval not displayed. Cardiac Enzymes:  Recent Labs Lab 05/18/14 1345 05/18/14 2121 05/19/14 0154 05/19/14 0754  TROPONINI 0.48* 0.32* 0.32* 0.32*   BNP (last 3 results)  Recent Labs  03/07/14 0352 03/07/14 0841 05/18/14 1345  PROBNP 306.1* 264.6* 2656.0*   CBG:  Recent Labs Lab 05/20/14 0558 05/20/14 1150 05/20/14 1701 05/20/14 2116 05/21/14 0601  GLUCAP 136* 115* 117* 119* 102*    Recent Results (from the past 240 hour(s))  WET PREP, GENITAL     Status: Abnormal   Collection Time    05/14/14  9:42 PM      Result Value Ref Range Status   Yeast Wet Prep HPF POC NONE SEEN  NONE SEEN Final   Trich, Wet Prep NONE SEEN  NONE SEEN Final   Clue Cells Wet Prep HPF POC  RARE (*) NONE SEEN Final   WBC, Wet Prep HPF POC RARE (*) NONE SEEN Final  MRSA PCR SCREENING     Status: Abnormal   Collection Time    05/15/14  3:13 AM      Result Value Ref Range Status   MRSA by PCR POSITIVE (*) NEGATIVE Final   Comment: RESULT CALLED TO, READ BACK BY AND VERIFIED WITH:     BBRALLEY RN AT [id number] ON [id number] BY DLONG                The GeneXpert MRSA Assay (FDA     approved for NASAL specimens     only), is one component of a     comprehensive MRSA colonization     surveillance program. It is not     intended to diagnose MRSA     infection nor to guide or     monitor treatment for     MRSA infections.  MRSA PCR SCREENING     Status: None   Collection Time    05/18/14  6:25 PM      Result Value Ref Range Status   MRSA by PCR NEGATIVE  NEGATIVE Final   Comment:            The GeneXpert MRSA Assay (FDA      approved for NASAL specimens     only), is one component of a     comprehensive MRSA colonization     surveillance program. It is not     intended to diagnose MRSA     infection nor to guide or     monitor treatment for     MRSA infections.     Studies: No results found.  Scheduled Meds: . atorvastatin  10 mg Oral q1800  . buPROPion  150 mg Oral Daily  . cefTRIAXone (ROCEPHIN)  IV  1 g Intravenous Q24H  . ferrous sulfate  325 mg Oral BID WC  . fluticasone  2 puff Inhalation BID  . furosemide  40 mg Oral Daily  . insulin aspart  0-20 Units Subcutaneous TID WC  . levothyroxine  25 mcg Oral QAC breakfast  . megestrol  160 mg Oral Daily  . metFORMIN  500 mg Oral BID WC  . metoprolol tartrate  25 mg Oral BID  . phenazopyridine  200 mg Oral TID WC  . verapamil  120 mg Oral Daily   Continuous Infusions:  Antibiotics Given (last 72 hours)   Date/Time Action Medication Dose Rate   05/20/14 1835 Given   cefTRIAXone (ROCEPHIN) 1 g in dextrose 5 % 50 mL IVPB 1 g 100 mL/hr      Principal Problem:   Acute on chronic diastolic congestive heart failure Active Problems:   Acute blood loss anemia   HOCM (hypertrophic obstructive cardiomyopathy)   Catheter-associated urinary tract infection   Hypokalemia    Time spent: 25 min    Erin Sexton, [facility] Pager [phone]. If 7PM-7AM, please contact night-coverage at [email or web address], password [facility] 05/21/2014, 8:22 AM  LOS: 3 days

## 2014-05-21 NOTE — Progress Notes (Signed)
Primary cardiologist: Dr. Kate Sable  Subjective:   Has experienced some nausea this morning. No emesis. No chest pain.   Objective:   Temp:  [97.8 F (36.6 C)-98.1 F (36.7 C)] 97.8 F (36.6 C) (06/13 0525) Pulse Rate:  [79-97] 79 (06/13 0525) Resp:  [20] 20 (06/13 0525) BP: (135-144)/(54-67) 135/54 mmHg (06/13 0525) SpO2:  [97 %-100 %] 100 % (06/13 0525) Weight:  [383 lb 9.6 oz (174 kg)] 383 lb 9.6 oz (174 kg) (06/13 0525) Last BM Date: 05/21/14  Filed Weights   05/18/14 1832 05/19/14 0437 05/21/14 0525  Weight: 400 lb 5.7 oz (181.6 kg) 399 lb 4.1 oz (181.1 kg) 383 lb 9.6 oz (174 kg)    Intake/Output Summary (Last 24 hours) at 05/21/14 1002 Last data filed at 05/21/14 0900  Gross per 24 hour  Intake    480 ml  Output      0 ml  Net    480 ml    Telemetry: Sinus rhythm.  Exam:  General: Morbidly obese, no distress.  Lungs: Decreased breath sounds, nonlabored.  Cardiac: RRR with 2/6 systolic murmur at the base.  Abdomen: Obese, nontender.  Extremities: Chronic appearing edema.   Lab Results:  Basic Metabolic Panel:  Recent Labs Lab 05/19/14 0154 05/20/14 0921 05/21/14 0405  NA 140 144 143  K 4.0 3.5* 3.5*  CL 102 106 103  CO2 19 24 26   GLUCOSE 160* 145* 106*  BUN 10 13 13   CREATININE 0.88 0.83 0.87  CALCIUM 8.0* 7.8* 8.2*    CBC:  Recent Labs Lab 05/18/14 2121 05/20/14 0921 05/21/14 0405  WBC 17.9* 11.4* 10.7*  HGB 7.5* 7.4* 7.2*  HCT 22.4* 22.7* 22.3*  MCV 86.2 88.7 89.2  PLT 239 239 279    Cardiac Enzymes:  Recent Labs Lab 05/18/14 2121 05/19/14 0154 05/19/14 0754  TROPONINI 0.32* 0.32* 0.32*    BNP:  Recent Labs  03/07/14 0352 03/07/14 0841 05/18/14 1345  PROBNP 306.1* 264.6* 2656.0*     Medications:   Scheduled Medications: . atorvastatin  10 mg Oral q1800  . buPROPion  150 mg Oral Daily  . cefTRIAXone (ROCEPHIN)  IV  1 g Intravenous Q24H  . ferrous sulfate  325 mg Oral BID WC  . fluticasone  2  puff Inhalation BID  . furosemide  40 mg Oral Daily  . insulin aspart  0-20 Units Subcutaneous TID WC  . levothyroxine  25 mcg Oral QAC breakfast  . megestrol  160 mg Oral Daily  . metFORMIN  500 mg Oral BID WC  . metoprolol tartrate  25 mg Oral BID  . phenazopyridine  200 mg Oral TID WC  . verapamil  120 mg Oral Daily      PRN Medications:  acetaminophen, albuterol, alum & mag hydroxide-simeth, morphine injection, ondansetron (ZOFRAN) IV   Assessment:   1. Acute on chronic diastolic heart failure. Weight decreasing with diuresis.  2. 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  3. Morbid obesity.  4. Acute blood loss anemia. Status post PRBC transfusions. Extra volume was also contributor to acute on chronic diastolic heart failure. Current hemoglobin 7.2.  5. Catheter associated UTI, Foley discontinued. She is on Rocephin and Perdiem, managed by internal medicine.  6. Menorrhagia, recently stopped.   Plan/Discussion:    Increase Verapamil SR to 180 mg daily to attain more optimal heart rate and blood pressure control with HOCM. No change in diuretic regimen for now. Appreciate assistance from internal medicine regarding  other active issues. Followup CBC tomorrow. Might need to consider at least one additional unit of PRBCs. She is not ready for discharge.   Satira Sark, M.D., F.A.C.C.

## 2014-05-22 LAB — BASIC METABOLIC PANEL
BUN: 13 mg/dL (ref 6–23)
CALCIUM: 8.4 mg/dL (ref 8.4–10.5)
CO2: 23 meq/L (ref 19–32)
CREATININE: 0.85 mg/dL (ref 0.50–1.10)
Chloride: 101 mEq/L (ref 96–112)
GFR calc Af Amer: >90 mL/min (ref >90)
GFR calc non Af Amer: 82 mL/min — ABNORMAL LOW (ref >90)
Glucose, Bld: 98 mg/dL (ref 70–99)
Potassium: 3.6 mEq/L — ABNORMAL LOW (ref 3.7–5.3)
Sodium: 142 mEq/L (ref 137–147)

## 2014-05-22 LAB — CBC
HEMATOCRIT: 22.4 % — AB (ref 36.0–46.0)
Hemoglobin: 7.1 g/dL — ABNORMAL LOW (ref 12.0–15.0)
MCH: 28.2 pg (ref 26.0–34.0)
MCHC: 31.7 g/dL (ref 30.0–36.0)
MCV: 88.9 fL (ref 78.0–100.0)
PLATELETS: 295 10*3/uL (ref 150–400)
RBC: 2.52 MIL/uL — AB (ref 3.87–5.11)
RDW: 16.1 % — AB (ref 11.5–15.5)
WBC: 8.6 10*3/uL (ref 4.0–10.5)

## 2014-05-22 LAB — GLUCOSE, CAPILLARY
Glucose-Capillary: 116 mg/dL — ABNORMAL HIGH (ref 70–99)
Glucose-Capillary: 117 mg/dL — ABNORMAL HIGH (ref 70–99)
Glucose-Capillary: 95 mg/dL (ref 70–99)

## 2014-05-22 LAB — PREPARE RBC (CROSSMATCH)

## 2014-05-22 MED ORDER — POTASSIUM CHLORIDE CRYS ER 20 MEQ PO TBCR
40.0000 meq | EXTENDED_RELEASE_TABLET | Freq: Once | ORAL | Status: AC
  Administered 2014-05-22: 40 meq via ORAL
  Filled 2014-05-22: qty 2

## 2014-05-22 MED ORDER — FUROSEMIDE 10 MG/ML IJ SOLN
40.0000 mg | Freq: Once | INTRAMUSCULAR | Status: AC
  Administered 2014-05-22: 40 mg via INTRAVENOUS

## 2014-05-22 MED ORDER — FUROSEMIDE 10 MG/ML IJ SOLN
INTRAMUSCULAR | Status: AC
  Filled 2014-05-22: qty 4

## 2014-05-22 MED ORDER — FUROSEMIDE 10 MG/ML IJ SOLN
20.0000 mg | Freq: Once | INTRAMUSCULAR | Status: AC
  Administered 2014-05-22: 20 mg via INTRAVENOUS

## 2014-05-22 MED ORDER — FUROSEMIDE 10 MG/ML IJ SOLN
20.0000 mg | Freq: Once | INTRAMUSCULAR | Status: DC
  Filled 2014-05-22: qty 2

## 2014-05-22 MED ORDER — DIPHENHYDRAMINE HCL 25 MG PO CAPS
25.0000 mg | ORAL_CAPSULE | Freq: Once | ORAL | Status: AC
  Administered 2014-05-22: 25 mg via ORAL
  Filled 2014-05-22: qty 1

## 2014-05-22 MED ORDER — FUROSEMIDE 40 MG PO TABS
40.0000 mg | ORAL_TABLET | Freq: Two times a day (BID) | ORAL | Status: DC
Start: 2014-05-22 — End: 2014-05-25
  Administered 2014-05-22 – 2014-05-25 (×7): 40 mg via ORAL
  Filled 2014-05-22 (×8): qty 1

## 2014-05-22 NOTE — Progress Notes (Signed)
Heart Failure Navigator Consult Note  Presentation: Erin Sexton is a 43 year-old female has a known history of hypertrophic cardiomyopathy and is had severe menorrhagia recently. She was recently admitted with profuse vaginal bleeding and received transfusion of 6 units of packed cells and was sent home yesterday but was mildly short of breath. Her vaginal bleeding is being managed with Premarin and Megace. She developed worsening dyspnea last night and presented to [facility] in [place] with congestive heart failure and pulmonary edema today. She was treated with BiPAP and also given intravenous Lasix as well as 5 mg of Lopressor. Nitroglycerin drip had been started and was stopped. She has diuresed significantly and was transferred here in is currently feeling much better. She has a very mild elevation of troponin. She currently denies any chest tightness or heaviness and is not short of breath at the present time. She has had some edema since her recent admission.  She has severe morbid obesity and was not felt to be a good candidate for noninvasive or invasive testing and has been managed conservatively. She is still significantly anemic.   Past Medical History  Diagnosis Date  . COPD (chronic obstructive pulmonary disease)   . Depression   . Hypertension   . Asthma   . Anxiety   . Anemia   . Blood transfusion without reported diagnosis   . Diabetes mellitus without complication     a. Dx 02/2014.  [name] HOCM (hypertrophic obstructive cardiomyopathy)     a. Dx 40347.  . Vaginal bleeding   . SVT (supraventricular tachycardia)     a. Noted on tele during 02/2014 adm for symptomatic anemia.  [name] NSVT (nonsustained ventricular tachycardia)     a. Noted on tele during 02/2014 adm for symptomatic anemia.  . Morbid obesity   . Abnormal CT scan     a. 02/2014: evaluated for dyspnea and initially placed on Xarelto for PE because radiologist could not exclude small peripheral pulmonary  emboli because of limited contrast. However, after admission for ++vaginal bleeding several days later, it was felt she did NOT have PE - underwent further testing with neg VQ 03/06/14, neg LE duplex.    History   Social History  . Marital Status: Legally Separated    Spouse Name: N/A    Number of Children: N/A  . Years of Education: N/A   Social History Main Topics  . Smoking status: Never Smoker   . Smokeless tobacco: Never Used  . Alcohol Use: No  . Drug Use: No  . Sexual Activity: None   Other Topics Concern  . None   Social History Narrative  . None    ECHO: Study Conclusions  - Left ventricle: The cavity size was normal. There was hypertrophy, consistent with hypertrophic cardiomyopathy. Systolic function was vigorous. The estimated ejection fraction was in the range of 65% to 70%. There was elevated outflow tract gradient (7.44m/s). Wall motion was normal; there were no regional wall motion abnormalities. Features are consistent with a pseudonormal left ventricular filling pattern, with concomitant abnormal relaxation and increased filling pressure (grade 2 diastolic dysfunction). - Mitral valve: Calcified annulus. - Left atrium: The atrium was moderately dilated. Transthoracic echocardiography. M-mode, complete 2D, spectral Doppler, and color Doppler. Height: Height: 175.3cm. Height: 69in. Weight: Weight: 177.8kg. Weight: 391.2lb. Body mass index: BMI: 57.9kg/m^2. Body surface area: BSA: 2.30m^2. Blood pressure: 121/53. Patient status: Inpatient. Location: ICU/CCU   BNP    Component Value Date/Time   PROBNP 2656.0* 05/18/2014 1345  Education Assessment and Provision:  Detailed education and instructions provided on heart failure disease management including the following:  Signs and symptoms of Heart Failure When to call the physician Importance of daily weights Low sodium diet Fluid restriction Medication management Anticipated future follow-up  appointments  Patient education given on each of the above topics.  Patient and mother acknowledge understanding and acceptance of all instructions.  Erin Sexton did not have a scale at home and I have provided her one for daily weights.  She has had some previous education regarding HF.  She was able to teach back many of the topics listed above.  She would benefit from dietician consult for further teaching regarding weight loss and low sodium diet.  She seems motivated to make changes and asked pertinent questions.  She plans to follow-up in [place] near their home.  Education Materials:  "Living Better With Heart Failure" Booklet, Daily Weight Tracker Tool   High Risk Criteria for Readmission and/or Poor Patient Outcomes:  (Recommend Follow-up with [facility])   EF <30%- No--65-70%- Grade 2 dias.dys  2 or more admissions in 6 months- Yes  Difficult social situation- No--Lives with mother  Demonstrates medication noncompliance- No    Barriers of Care:  Knowledge of HF and compliance  Discharge Planning:   Plans to discharge to home with mother.  Plans to follow-up at [facility] in [place].

## 2014-05-22 NOTE — Progress Notes (Signed)
PROGRESS NOTE  Erin Sexton JTT:[id number] DOB: 04/19/1971 DOA: 05/18/2014 PCP: Oren Binet, MD  Assessment/Plan:  Catheter associated UTI: ecoli-D/C foley. Ceftriaxone. Urine culture shows ecoli. Pyridium.   Hypokalemia: replete   Leukocytosis -trending down  ABLA: hgb 7.5. Getting 2 units PRBC today  Menorrhagia: has stopped. Missed appointment with gyn today. Will need f/u at discharge   Morbid obesity   Acute on chronic diastolic CHF   HOCM  Code Status: full Family Communication: patient Disposition Plan: per primary    HPI/Subjective: Getting 2 units PRBC today  Objective: Filed Vitals:   05/22/14 0500  BP: 131/76  Pulse: 71  Temp: 98.3 F (36.8 C)  Resp: 20    Intake/Output Summary (Last 24 hours) at 05/22/14 0947 Last data filed at 05/22/14 0700  Gross per 24 hour  Intake    240 ml  Output      0 ml  Net    240 ml   Filed Weights   05/19/14 0437 05/21/14 0525 05/22/14 0500  Weight: 181.1 kg (399 lb 4.1 oz) 174 kg (383 lb 9.6 oz) 173.3 kg (382 lb 0.9 oz)    Exam:   General:  Sleeping  Clear anterior  +BS   Data Reviewed: Basic Metabolic Panel:  Recent Labs Lab 05/18/14 1345 05/19/14 0154 05/20/14 0921 05/21/14 0405 05/22/14 0353  NA 142 140 144 143 142  K 4.4 4.0 3.5* 3.5* 3.6*  CL 108 102 106 103 101  CO2 23 19 24 26 23   GLUCOSE 128* 160* 145* 106* 98  BUN 11 10 13 13 13   CREATININE 0.80 0.88 0.83 0.87 0.85  CALCIUM 7.8* 8.0* 7.8* 8.2* 8.4   Liver Function Tests: No results found for this basename: AST, ALT, ALKPHOS, BILITOT, PROT, ALBUMIN,  in the last 168 hours No results found for this basename: LIPASE, AMYLASE,  in the last 168 hours No results found for this basename: AMMONIA,  in the last 168 hours CBC:  Recent Labs Lab 05/18/14 1345 05/18/14 2121 05/20/14 0921 05/21/14 0405 05/22/14 0353  WBC 15.2* 17.9* 11.4* 10.7* 8.6  NEUTROABS 12.4* 14.4*  --   --   --   HGB 7.3* 7.5* 7.4* 7.2* 7.1*  HCT 21.4*  22.4* 22.7* 22.3* 22.4*  MCV 85.9 86.2 88.7 89.2 88.9  PLT 203 239 239 279 295   Cardiac Enzymes:  Recent Labs Lab 05/18/14 1345 05/18/14 2121 05/19/14 0154 05/19/14 0754  TROPONINI 0.48* 0.32* 0.32* 0.32*   BNP (last 3 results)  Recent Labs  03/07/14 0352 03/07/14 0841 05/18/14 1345  PROBNP 306.1* 264.6* 2656.0*   CBG:  Recent Labs Lab 05/20/14 2116 05/21/14 0601 05/21/14 1133 05/21/14 1610 05/21/14 2137  GLUCAP 119* 102* 103* 107* 120*    Recent Results (from the past 240 hour(s))  WET PREP, GENITAL     Status: Abnormal   Collection Time    05/14/14  9:42 PM      Result Value Ref Range Status   Yeast Wet Prep HPF POC NONE SEEN  NONE SEEN Final   Trich, Wet Prep NONE SEEN  NONE SEEN Final   Clue Cells Wet Prep HPF POC RARE (*) NONE SEEN Final   WBC, Wet Prep HPF POC RARE (*) NONE SEEN Final  MRSA PCR SCREENING     Status: Abnormal   Collection Time    05/15/14  3:13 AM      Result Value Ref Range Status   MRSA by PCR POSITIVE (*) NEGATIVE Final   Comment: RESULT CALLED  TO, READ BACK BY AND VERIFIED WITH:     BBRALLEY RN AT 4196 ON [id number] BY DLONG                The GeneXpert MRSA Assay (FDA     approved for NASAL specimens     only), is one component of a     comprehensive MRSA colonization     surveillance program. It is not     intended to diagnose MRSA     infection nor to guide or     monitor treatment for     MRSA infections.  MRSA PCR SCREENING     Status: None   Collection Time    05/18/14  6:25 PM      Result Value Ref Range Status   MRSA by PCR NEGATIVE  NEGATIVE Final   Comment:            The GeneXpert MRSA Assay (FDA     approved for NASAL specimens     only), is one component of a     comprehensive MRSA colonization     surveillance program. It is not     intended to diagnose MRSA     infection nor to guide or     monitor treatment for     MRSA infections.  URINE CULTURE     Status: None   Collection Time    05/20/14 12:02  PM      Result Value Ref Range Status   Specimen Description URINE, CATHETERIZED   Final   Special Requests ADDED [id number]   Final   Culture  Setup Time     Final   Value: 05/21/2014 03:07     Performed at [facility]     Final   Value: >=100,000 COLONIES/ML     Performed at [facility]   Culture     Final   Value: ESCHERICHIA COLI     Performed at [facility]   Report Status PENDING   Incomplete     Studies: No results found.  Scheduled Meds: . atorvastatin  10 mg Oral q1800  . buPROPion  150 mg Oral Daily  . cefTRIAXone (ROCEPHIN)  IV  1 g Intravenous Q24H  . diphenhydrAMINE  25 mg Oral Once  . ferrous sulfate  325 mg Oral BID WC  . fluticasone  2 puff Inhalation BID  . furosemide  20 mg Intravenous Once  . furosemide  40 mg Oral BID  . insulin aspart  0-20 Units Subcutaneous TID WC  . levothyroxine  25 mcg Oral QAC breakfast  . megestrol  160 mg Oral Daily  . metFORMIN  500 mg Oral BID WC  . metoprolol tartrate  25 mg Oral BID  . phenazopyridine  200 mg Oral TID WC  . verapamil  180 mg Oral Daily   Continuous Infusions:  Antibiotics Given (last 72 hours)   Date/Time Action Medication Dose Rate   05/20/14 1835 Given   cefTRIAXone (ROCEPHIN) 1 g in dextrose 5 % 50 mL IVPB 1 g 100 mL/hr   05/21/14 1527 Given   cefTRIAXone (ROCEPHIN) 1 g in dextrose 5 % 50 mL IVPB 1 g 100 mL/hr      Principal Problem:   Acute on chronic diastolic congestive heart failure Active Problems:   Acute blood loss anemia   HOCM (hypertrophic obstructive cardiomyopathy)   Catheter-associated urinary tract infection   Hypokalemia    Time spent: 25  min    Eulogio Bear  [facility] Pager [phone]. If 7PM-7AM, please contact night-coverage at [email or web address], password [facility] 05/22/2014, 9:47 AM  LOS: 4 days

## 2014-05-22 NOTE — Progress Notes (Signed)
PA paged and aware, Pt c/o increased SOB while receiving 1st unit of blood. Pt had about 50-60 ml left on current transfusion.  Transfusion paused while paging PA. Pt's vital signs stable. Orders given to stop current transfusion and give 40 IV lasix X 1 now. Additional transfusion for today cancelled. Pt to receive one unit of PRBCs in am. Will continue to monitor.

## 2014-05-22 NOTE — Progress Notes (Signed)
Called by RN. Patient becoming more short of breath while unit of PRBCs is being infused.  She has about 30 minutes left. O2 sats are good. I advised her to stop the current unit of blood. Give IV Lasix 40 mg x 1 now. Plan to give the other unit of PRBCs in the AM with another round of Lasix 20 mg IV after the unit is complete. Check BMET, CBC in AM. Richardson Dopp, PA-C   05/22/2014 3:37 PM

## 2014-05-22 NOTE — Progress Notes (Signed)
Primary cardiologist: Dr. Kate Sable  Subjective:   Feels better this morning, eating breakfast without nausea. Still very weak feeling.   Objective:   Temp:  [97.8 F (36.6 C)-98.5 F (36.9 C)] 98.3 F (36.8 C) (06/14 0500) Pulse Rate:  [71-77] 71 (06/14 0500) Resp:  [20] 20 (06/14 0500) BP: (122-131)/(64-76) 131/76 mmHg (06/14 0500) SpO2:  [100 %] 100 % (06/14 0500) Weight:  [382 lb 0.9 oz (173.3 kg)] 382 lb 0.9 oz (173.3 kg) (06/14 0500) Last BM Date: 05/21/14  Filed Weights   05/19/14 0437 05/21/14 0525 05/22/14 0500  Weight: 399 lb 4.1 oz (181.1 kg) 383 lb 9.6 oz (174 kg) 382 lb 0.9 oz (173.3 kg)    Intake/Output Summary (Last 24 hours) at 05/22/14 0800 Last data filed at 05/21/14 0900  Gross per 24 hour  Intake    240 ml  Output      0 ml  Net    240 ml    Telemetry: Sinus rhythm.  Exam:  General: Morbidly obese, no distress.  Lungs: Decreased breath sounds, nonlabored.  Cardiac: RRR with 2/6 systolic murmur at the base.  Abdomen: Obese, nontender.  Extremities: Chronic appearing edema.   Lab Results:  Basic Metabolic Panel:  Recent Labs Lab 05/20/14 0921 05/21/14 0405 05/22/14 0353  NA 144 143 142  K 3.5* 3.5* 3.6*  CL 106 103 101  CO2 24 26 23   GLUCOSE 145* 106* 98  BUN 13 13 13   CREATININE 0.83 0.87 0.85  CALCIUM 7.8* 8.2* 8.4    CBC:  Recent Labs Lab 05/20/14 0921 05/21/14 0405 05/22/14 0353  WBC 11.4* 10.7* 8.6  HGB 7.4* 7.2* 7.1*  HCT 22.7* 22.3* 22.4*  MCV 88.7 89.2 88.9  PLT 239 279 295    Cardiac Enzymes:  Recent Labs Lab 05/18/14 2121 05/19/14 0154 05/19/14 0754  TROPONINI 0.32* 0.32* 0.32*    BNP:  Recent Labs  03/07/14 0352 03/07/14 0841 05/18/14 1345  PROBNP 306.1* 264.6* 2656.0*     Medications:   Scheduled Medications: . atorvastatin  10 mg Oral q1800  . buPROPion  150 mg Oral Daily  . cefTRIAXone (ROCEPHIN)  IV  1 g Intravenous Q24H  . diphenhydrAMINE  25 mg Oral Once  .  ferrous sulfate  325 mg Oral BID WC  . fluticasone  2 puff Inhalation BID  . furosemide  20 mg Intravenous Once  . furosemide  40 mg Oral BID  . insulin aspart  0-20 Units Subcutaneous TID WC  . levothyroxine  25 mcg Oral QAC breakfast  . megestrol  160 mg Oral Daily  . metFORMIN  500 mg Oral BID WC  . metoprolol tartrate  25 mg Oral BID  . phenazopyridine  200 mg Oral TID WC  . verapamil  180 mg Oral Daily     PRN Medications: acetaminophen, albuterol, alum & mag hydroxide-simeth, morphine injection, ondansetron (ZOFRAN) IV   Assessment:   1. Acute on chronic diastolic heart failure. Weight decreasing with diuresis.  2. 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  3. Morbid obesity.  4. Acute blood loss anemia. Status post PRBC transfusions. Extra volume was also contributor to acute on chronic diastolic heart failure. Hemoglobin down to 7.1.  5. Catheter associated UTI, Foley discontinued. She is on Rocephin and Perdiem, managed by internal medicine.  6. Menorrhagia, recently stopped. Patient reports no obvious recurrence.   Plan/Discussion:    Plan to give 2 units of PRBCs today due to symptomatic anemia  with decreasing hemoglobin with Lasix in between, also increase her baseline Lasix dose. Overall heart rate and blood pressure control looks good with medication adjustments made. Appreciate assistance from internal medicine. Followup CBC and BMET in a.m.   Satira Sark, M.D., F.A.C.C.

## 2014-05-23 DIAGNOSIS — I5033 Acute on chronic diastolic (congestive) heart failure: Secondary | ICD-10-CM

## 2014-05-23 DIAGNOSIS — R0902 Hypoxemia: Secondary | ICD-10-CM

## 2014-05-23 LAB — BASIC METABOLIC PANEL
BUN: 14 mg/dL (ref 6–23)
CO2: 28 mEq/L (ref 19–32)
CREATININE: 1.05 mg/dL (ref 0.50–1.10)
Calcium: 8.6 mg/dL (ref 8.4–10.5)
Chloride: 100 mEq/L (ref 96–112)
GFR, EST AFRICAN AMERICAN: 73 mL/min — AB (ref >90)
GFR, EST NON AFRICAN AMERICAN: 63 mL/min — AB (ref >90)
Glucose, Bld: 106 mg/dL — ABNORMAL HIGH (ref 70–99)
Potassium: 3.8 mEq/L (ref 3.7–5.3)
Sodium: 142 mEq/L (ref 137–147)

## 2014-05-23 LAB — URINE CULTURE: Colony Count: >=100000

## 2014-05-23 LAB — CBC
HEMATOCRIT: 25.8 % — AB (ref 36.0–46.0)
Hemoglobin: 8.2 g/dL — ABNORMAL LOW (ref 12.0–15.0)
MCH: 27.7 pg (ref 26.0–34.0)
MCHC: 31.8 g/dL (ref 30.0–36.0)
MCV: 87.2 fL (ref 78.0–100.0)
Platelets: 327 10*3/uL (ref 150–400)
RBC: 2.96 MIL/uL — ABNORMAL LOW (ref 3.87–5.11)
RDW: 16.6 % — AB (ref 11.5–15.5)
WBC: 9.9 10*3/uL (ref 4.0–10.5)

## 2014-05-23 LAB — GLUCOSE, CAPILLARY
GLUCOSE-CAPILLARY: 98 mg/dL (ref 70–99)
Glucose-Capillary: 99 mg/dL (ref 70–99)
Glucose-Capillary: 126 mg/dL — ABNORMAL HIGH (ref 70–99)
Glucose-Capillary: 84 mg/dL (ref 70–99)

## 2014-05-23 MED ORDER — IBUPROFEN 400 MG PO TABS
400.0000 mg | ORAL_TABLET | Freq: Once | ORAL | Status: AC
  Administered 2014-05-23: 400 mg via ORAL
  Filled 2014-05-23 (×2): qty 1

## 2014-05-23 MED ORDER — CIPROFLOXACIN HCL 500 MG PO TABS
500.0000 mg | ORAL_TABLET | Freq: Two times a day (BID) | ORAL | Status: DC
  Administered 2014-05-23 – 2014-05-25 (×5): 500 mg via ORAL
  Filled 2014-05-23 (×10): qty 1

## 2014-05-23 NOTE — Progress Notes (Signed)
Patient seen and examined and agree with above note as outlined by Perry Mount, PA-C.  Feeling much better.  Mild SOB overnight but none this am.  Hgb improved to 8.2.  Will hold off on second unit of PRBC's at this time.  Will have patient ambulate in hall today and if stable and Hbg stable tomorrow am will d/c home

## 2014-05-23 NOTE — Progress Notes (Signed)
Patient Name: Erin Sexton Date of Encounter: 05/23/2014     Principal Problem:   Acute on chronic diastolic congestive heart failure Active Problems:   Acute blood loss anemia   HOCM (hypertrophic obstructive cardiomyopathy)   Catheter-associated urinary tract infection   Hypokalemia    SUBJECTIVE   Feeling pretty good today. No SOB or CP. A little nauseated.   CURRENT MEDS . atorvastatin  10 mg Oral q1800  . buPROPion  150 mg Oral Daily  . cefTRIAXone (ROCEPHIN)  IV  1 g Intravenous Q24H  . ferrous sulfate  325 mg Oral BID WC  . fluticasone  2 puff Inhalation BID  . furosemide  40 mg Oral BID  . insulin aspart  0-20 Units Subcutaneous TID WC  . levothyroxine  25 mcg Oral QAC breakfast  . megestrol  160 mg Oral Daily  . metFORMIN  500 mg Oral BID WC  . metoprolol tartrate  25 mg Oral BID  . verapamil  180 mg Oral Daily    OBJECTIVE  Filed Vitals:   05/22/14 1340 05/22/14 1445 05/22/14 2102 05/23/14 0500  BP: 130/71 128/75 117/76 132/74  Pulse: 67 69 71 78  Temp: 97.9 F (36.6 C) 97 F (36.1 C) 98 F (36.7 C) 97.9 F (36.6 C)  TempSrc: Oral Oral Oral Oral  Resp: 20 20 20 19   Height:      Weight:    382 lb (173.274 kg)  SpO2: 98% 98% 99% 99%    Intake/Output Summary (Last 24 hours) at 05/23/14 0749 Last data filed at 05/23/14 0500  Gross per 24 hour  Intake 1042.5 ml  Output   4175 ml  Net -3132.5 ml   Filed Weights   05/21/14 0525 05/22/14 0500 05/23/14 0500  Weight: 383 lb 9.6 oz (174 kg) 382 lb 0.9 oz (173.3 kg) 382 lb (173.274 kg)    PHYSICAL EXAM  General: Pleasant. Super morbidly obese. She appears very uncomfortable and grunting with discomfort but in no acute distress. Neuro: Alert and oriented X 3. Moves all extremities spontaneously. Psych: Normal affect. HEENT:  Normal  Neck: Supple without bruits or JVD. Lungs:  Resp regular. Distant lung sounds due to body habitus Heart: RRR no s3, s4, + murmur Abdomen: Soft, non-tender,  non-distended, BS + x 4. Large pannus. Foley with frank blood Extremities: No clubbing, cyanosis or edema. DP/PT/Radials 2+ and equal bilaterally.  Accessory Clinical Findings  CBC  Recent Labs  05/22/14 0353 05/23/14 0348  WBC 8.6 9.9  HGB 7.1* 8.2*  HCT 22.4* 25.8*  MCV 88.9 87.2  PLT 295 237   Basic Metabolic Panel  Recent Labs  05/22/14 0353 05/23/14 0348  NA 142 142  K 3.6* 3.8  CL 101 100  CO2 23 28  GLUCOSE 98 106*  BUN 13 14  CREATININE 0.85 1.05  CALCIUM 8.4 8.6    TELE  NSR  Radiology/Studies  US Transvaginal Non-ob  May 17, 2014   CLINICAL DATA:  Vaginal bleeding since March 2015.  EXAM: TRANSABDOMINAL AND TRANSVAGINAL ULTRASOUND OF PELVIS  TECHNIQUE: Both transabdominal and transvaginal ultrasound examinations of the pelvis were performed. Transabdominal technique was performed for global imaging of the pelvis including uterus, ovaries, adnexal regions, and pelvic cul-de-sac. It was necessary to proceed with endovaginal exam following the transabdominal exam to visualize the endometrium and ovaries.  COMPARISON:  03/06/2014  FINDINGS: Examination is somewhat limited due to patient body habitus.  Uterus  Measurements: 6.9 x 7.8 x 12.0 cm. No fibroids or other mass  visualized.  Endometrium  Thickness: 27 mm (previously measured 15 mm). Thickened a moderately heterogeneous likely due in part to hemorrhagic debris in this patient with moderate vaginal bleeding. Could not exclude an endometrial mass/polyp or hyperplasia.  Right ovary  Not visualized.  Left ovary  Not visualized.  Other findings  There is a large heterogeneous somewhat hyperechoic mass centered over the cervix measuring 6 x 6 x 6.9 cm.  IMPRESSION: Slightly enlarged uterus with moderately thickened heterogeneous endometrium measuring 27 mm which has increased in thickness compared to the prior exam. This is likely due in part to hemorrhagic debris in this patient with moderate vaginal bleeding, although  cannot exclude a mass. New large heterogeneous echogenic masslike area centered over the cervix measuring 6 x 6 x 6.9 cm likely a large region of clot. Recommend GYN consultation.  If bleeding remains unresponsive to hormonal or medical therapy, focal lesion work-up with sonohysterogram should be considered. Endometrial biopsy should also be considered in pre-menopausal patients at high risk for endometrial carcinoma. (Ref: Radiological Reasoning: Algorithmic Workup of Abnormal Vaginal Bleeding with Endovaginal Sonography and Sonohysterography. AJR 2008; 703:J00-93)  Nonvisualization of the ovaries.   Electronically Signed   By: Marin Olp M.D.   On: 05/15/2014 07:19   US Pelvis Complete  05/15/2014   CLINICAL DATA:  Vaginal bleeding since March 2015.  EXAM: TRANSABDOMINAL AND TRANSVAGINAL ULTRASOUND OF PELVIS  TECHNIQUE: Both transabdominal and transvaginal ultrasound examinations of the pelvis were performed. Transabdominal technique was performed for global imaging of the pelvis including uterus, ovaries, adnexal regions, and pelvic cul-de-sac. It was necessary to proceed with endovaginal exam following the transabdominal exam to visualize the endometrium and ovaries.  COMPARISON:  03/06/2014  FINDINGS: Examination is somewhat limited due to patient body habitus.  Uterus  Measurements: 6.9 x 7.8 x 12.0 cm. No fibroids or other mass visualized.  Endometrium  Thickness: 27 mm (previously measured 15 mm). Thickened a moderately heterogeneous likely due in part to hemorrhagic debris in this patient with moderate vaginal bleeding. Could not exclude an endometrial mass/polyp or hyperplasia.  Right ovary  Not visualized.  Left ovary  Not visualized.  Other findings  There is a large heterogeneous somewhat hyperechoic mass centered over the cervix measuring 6 x 6 x 6.9 cm.  IMPRESSION: Slightly enlarged uterus with moderately thickened heterogeneous endometrium measuring 27 mm which has increased in thickness  compared to the prior exam. This is likely due in part to hemorrhagic debris in this patient with moderate vaginal bleeding, although cannot exclude a mass. New large heterogeneous echogenic masslike area centered over the cervix measuring 6 x 6 x 6.9 cm likely a large region of clot. Recommend GYN consultation.  If bleeding remains unresponsive to hormonal or medical therapy, focal lesion work-up with sonohysterogram should be considered. Endometrial biopsy should also be considered in pre-menopausal patients at high risk for endometrial carcinoma. (Ref: Radiological Reasoning: Algorithmic Workup of Abnormal Vaginal Bleeding with Endovaginal Sonography and Sonohysterography. AJR 2008; 818:E99-37)  Nonvisualization of the ovaries.   Electronically Signed   By: Marin Olp M.D.   On: 05/15/2014 07:19   Dg Chest Port 1 View  05/18/2014   CLINICAL DATA:  Chest pain and shortness of breath  EXAM: PORTABLE CHEST - 1 VIEW  COMPARISON:  03/05/2014  FINDINGS: Cardiac shadow is mildly enlarged. Diffuse vascular congestion and parenchymal opacities are noted consistent with congestive failure. No pneumothorax or sizable effusion is seen.  IMPRESSION: Changes consistent with congestive failure.   Electronically Signed  By: Inez Catalina M.D.   On: 05/18/2014 14:33    ASSESSMENT AND PLAN  This 43 year-old female with a hx of HOCM, COPD, DM, morbid obesity, SVT, NSVT and severe menorrhagia recently. She was admitted in 02/2014 with dyspnea and chest pain. CT of the chest was felt to demonstrate possible small bilateral pulmonary emboli. She was started on Xarelto with plans to anticoagulate for 3-6 mos. She was then readmitted to 3/28-3/31 with vaginal bleeding and profound anemia. She required transfusion with 4 units PRBCs. She was seen by pulmonology due to recent history of possible PE. Chest CT was reviewed and did not demonstrate evidence of pulmonary embolism. VQ scan was negative. Therefore, it was felt that she  did not have a pulmonary embolism and anticoagulation was discontinued. She was admitted again just a few days ago with to recurrent profuse vaginal bleeding and received transfusion of 6 units  and was sent home 05/17/14 but remained short of breath and returned to [facility] later that night with worsening dyspnea. She was found to have acute pulmonary edema and reated with BiPAP and IV Lasix. She has a very mild elevation of troponin and was transferred to [facility] for further management.   Acute on chronic diastolic CHF-  Likely exacerbated by volume of recent transfusion with 6 units in the setting of HOCM.  -- Neg 8.9L; 400 lbs--> 382 lbs today -- On Lasix 40mg  po bid. Given dose of 40mg  and 20mg  IV Lasix yesterday.  -- She became SOB while receiving 1U of blood last night. The infusion was discontinued and she was given IV Lasix. May hold off on transfusions this am as she is >8 and feeling well from a SOB standpoint.   Anemia- Improving. Last hg 8.2.   -- Ferratin 9. Iron 15.  -- Secondary to vaginal bleeding. Seen by Dr. Elly Modena with OB/GYN on 05/14/14 and placed on Premarin and Megace and iron.  -- No active bleeding.  Elevated troponin - explained in the setting of CHF (0.32) --She has severe morbid obesity and was not felt to be a good candidate for noninvasive or invasive testing and has been managed conservatively. -- Continue BB and statin  UTI- ecoli. Foley discontinued. On Ceftriaxone and Pyridium per IM.   HOCM  -- LVEF 65-70% with grade 2 diastolic dysfunction. -- Increase Verapamil SR to 180 mg daily to attain more optimal heart rate and blood pressure control with HOCM.  DM- Hg A1c 5.7  -- Continue metformin   HLD- continue statin    Tyrell Antonio PA-C  Pager [phone]

## 2014-05-23 NOTE — Progress Notes (Signed)
05/23/2014 3:26 PM Nursing note Pt. C/o continued HA after PRN tylenol administration. [facility] paged and made aware. Verbal orders received for Advil 400 mg PO x1. Orders enacted and pt. Updated on plan of care. Will continue to closely monitor patient.  Sosha Shepherd, Arville Lime

## 2014-05-23 NOTE — Progress Notes (Signed)
PROGRESS NOTE  Erin Sexton XTG:[id number] DOB: October 10, 1971 DOA: 05/18/2014 PCP: Oren Binet, MD  Assessment/Plan:  Catheter associated UTI: ecoli-D/C foley. Ceftriaxone. Urine culture shows ecoli- change to PO cipro for 10 days course stop date: 6/21  Hypokalemia: replete   Leukocytosis -trending down  ABLA: improved  Menorrhagia: has stopped. Missed appointment with gyn today. Will need f/u at discharge   Morbid obesity   Acute on chronic diastolic CHF   HOCM  Will sign off, please call [phone] if further questions   Code Status: full Family Communication: patient Disposition Plan: per primary    HPI/Subjective: Feeling less SOB   Objective: Filed Vitals:   05/23/14 1013  BP: 135/78  Pulse: 75  Temp:   Resp:     Intake/Output Summary (Last 24 hours) at 05/23/14 1018 Last data filed at 05/23/14 0500  Gross per 24 hour  Intake 1042.5 ml  Output   4175 ml  Net -3132.5 ml   Filed Weights   05/21/14 0525 05/22/14 0500 05/23/14 0500  Weight: 174 kg (383 lb 9.6 oz) 173.3 kg (382 lb 0.9 oz) 173.274 kg (382 lb)    Exam:   General:  Sitting on side of bed  Decreased b/l  +BS   Data Reviewed: Basic Metabolic Panel:  Recent Labs Lab 05/19/14 0154 05/20/14 0921 05/21/14 0405 05/22/14 0353 05/23/14 0348  NA 140 144 143 142 142  K 4.0 3.5* 3.5* 3.6* 3.8  CL 102 106 103 101 100  CO2 19 24 26 23 28   GLUCOSE 160* 145* 106* 98 106*  BUN 10 13 13 13 14   CREATININE 0.88 0.83 0.87 0.85 1.05  CALCIUM 8.0* 7.8* 8.2* 8.4 8.6   Liver Function Tests: No results found for this basename: AST, ALT, ALKPHOS, BILITOT, PROT, ALBUMIN,  in the last 168 hours No results found for this basename: LIPASE, AMYLASE,  in the last 168 hours No results found for this basename: AMMONIA,  in the last 168 hours CBC:  Recent Labs Lab 05/18/14 1345 05/18/14 2121 05/20/14 0921 05/21/14 0405 05/22/14 0353 05/23/14 0348  WBC 15.2* 17.9* 11.4* 10.7* 8.6 9.9    NEUTROABS 12.4* 14.4*  --   --   --   --   HGB 7.3* 7.5* 7.4* 7.2* 7.1* 8.2*  HCT 21.4* 22.4* 22.7* 22.3* 22.4* 25.8*  MCV 85.9 86.2 88.7 89.2 88.9 87.2  PLT 203 239 239 279 295 327   Cardiac Enzymes:  Recent Labs Lab 05/18/14 1345 05/18/14 2121 05/19/14 0154 05/19/14 0754  TROPONINI 0.48* 0.32* 0.32* 0.32*   BNP (last 3 results)  Recent Labs  03/07/14 0352 03/07/14 0841 05/18/14 1345  PROBNP 306.1* 264.6* 2656.0*   CBG:  Recent Labs Lab 05/21/14 2137 05/22/14 1208 05/22/14 1639 05/22/14 2101 05/23/14 0611  GLUCAP 120* 116* 117* 95 99    Recent Results (from the past 240 hour(s))  WET PREP, GENITAL     Status: Abnormal   Collection Time    05/14/14  9:42 PM      Result Value Ref Range Status   Yeast Wet Prep HPF POC NONE SEEN  NONE SEEN Final   Trich, Wet Prep NONE SEEN  NONE SEEN Final   Clue Cells Wet Prep HPF POC RARE (*) NONE SEEN Final   WBC, Wet Prep HPF POC RARE (*) NONE SEEN Final  MRSA PCR SCREENING     Status: Abnormal   Collection Time    05/15/14  3:13 AM      Result Value Ref Range Status  MRSA by PCR POSITIVE (*) NEGATIVE Final   Comment: RESULT CALLED TO, READ BACK BY AND VERIFIED WITH:     BBRALLEY RN AT [id number] ON [id number] BY DLONG                The GeneXpert MRSA Assay (FDA     approved for NASAL specimens     only), is one component of a     comprehensive MRSA colonization     surveillance program. It is not     intended to diagnose MRSA     infection nor to guide or     monitor treatment for     MRSA infections.  MRSA PCR SCREENING     Status: None   Collection Time    05/18/14  6:25 PM      Result Value Ref Range Status   MRSA by PCR NEGATIVE  NEGATIVE Final   Comment:            The GeneXpert MRSA Assay (FDA     approved for NASAL specimens     only), is one component of a     comprehensive MRSA colonization     surveillance program. It is not     intended to diagnose MRSA     infection nor to guide or     monitor  treatment for     MRSA infections.  URINE CULTURE     Status: None   Collection Time    05/20/14 12:02 PM      Result Value Ref Range Status   Specimen Description URINE, CATHETERIZED   Final   Special Requests ADDED [id number]   Final   Culture  Setup Time     Final   Value: 05/21/2014 03:07     Performed at [place]     Final   Value: >=100,000 COLONIES/ML     Performed at [facility]   Culture     Final   Value: ESCHERICHIA COLI     Performed at [facility]   Report Status 05/23/2014 FINAL   Final   Organism ID, Bacteria ESCHERICHIA COLI   Final     Studies: No results found.  Scheduled Meds: . atorvastatin  10 mg Oral q1800  . buPROPion  150 mg Oral Daily  . ciprofloxacin  500 mg Oral BID  . ferrous sulfate  325 mg Oral BID WC  . fluticasone  2 puff Inhalation BID  . furosemide  40 mg Oral BID  . insulin aspart  0-20 Units Subcutaneous TID WC  . levothyroxine  25 mcg Oral QAC breakfast  . megestrol  160 mg Oral Daily  . metFORMIN  500 mg Oral BID WC  . metoprolol tartrate  25 mg Oral BID  . verapamil  180 mg Oral Daily   Continuous Infusions:  Antibiotics Given (last 72 hours)   Date/Time Action Medication Dose Rate   05/20/14 1835 Given   cefTRIAXone (ROCEPHIN) 1 g in dextrose 5 % 50 mL IVPB 1 g 100 mL/hr   05/21/14 1527 Given   cefTRIAXone (ROCEPHIN) 1 g in dextrose 5 % 50 mL IVPB 1 g 100 mL/hr   05/22/14 1802 Given   cefTRIAXone (ROCEPHIN) 1 g in dextrose 5 % 50 mL IVPB 1 g 100 mL/hr      Principal Problem:   Acute on chronic diastolic congestive heart failure Active Problems:   Acute blood loss anemia   HOCM (hypertrophic  obstructive cardiomyopathy)   Catheter-associated urinary tract infection   Hypokalemia    Time spent: 15 min    Erin Sexton  [facility] Pager [phone]. If 7PM-7AM, please contact night-coverage at [email or web address], password [facility] 05/23/2014, 10:18 AM  LOS: 5 days

## 2014-05-23 NOTE — Progress Notes (Signed)
05/23/2014 10:46 AM Nursing note Pt. Ambulated 25 ft in hallway with RN and on room air. Pt. Oxygen saturations remained greater than 93% on room air however, pt. Became extremely weak during ambulation and states she felt dizzy. BP checked before walk 133/75 and after 162/82. Pt. Assisted back to bed. Encouraged patient to call for assistance with toileting as pt did not tolerate ambulation well. Will continue to monitor patient.  Victor Langenbach, Arville Lime

## 2014-05-23 NOTE — Progress Notes (Signed)
Nutrition Consult/Brief Note  RD consulted for CHF and weight loss education.   RD provided "Low Sodium Nutrition Therapy" and "Weight Loss Tips" handout from the Academy of Nutrition and Dietetics.  Pt opted to review information on her own time.  Body mass index is 56.39 kg/(m^2). Pt meets criteria for Obesity Class III based on current BMI.  Current diet order is Carbohydrate Modified, patient is consuming approximately 75-100% of meals at this time. Labs and medications reviewed. No further nutrition interventions warranted at this time. If additional nutrition issues arise, please re-consult RD.   Arthur Holms, RD, LDN Pager #: [phone] After-Hours Pager #: [phone]

## 2014-05-23 NOTE — Progress Notes (Signed)
05/23/2014 8:47 AM  Verbal order Dr. Radford Pax not to complete ordered blood transfusion that was ordered for today due to Hgb over 8.0. Pt. Updated on plan of care.  Lamyah Creed, Arville Lime

## 2014-05-23 NOTE — Progress Notes (Signed)
05/23/2014 1650 Nursing note Pt. Noted to have 14 beat run of Vtach on monitor. Pt. Resting in bed, states she did feel a fluttering in her chest. Pt. States she has felt this way previously at home. VS collected and unremarkable. [name] [facility] paged and made aware. No new orders received at this time. Will continue to closely monitor patient.  [name], Arville Lime

## 2014-05-24 LAB — GLUCOSE, CAPILLARY
GLUCOSE-CAPILLARY: 90 mg/dL (ref 70–99)
Glucose-Capillary: 82 mg/dL (ref 70–99)
Glucose-Capillary: 101 mg/dL — ABNORMAL HIGH (ref 70–99)
Glucose-Capillary: 122 mg/dL — ABNORMAL HIGH (ref 70–99)

## 2014-05-24 LAB — CBC
HEMATOCRIT: 26.8 % — AB (ref 36.0–46.0)
HEMOGLOBIN: 8.4 g/dL — AB (ref 12.0–15.0)
MCH: 27.2 pg (ref 26.0–34.0)
MCHC: 31.3 g/dL (ref 30.0–36.0)
MCV: 86.7 fL (ref 78.0–100.0)
Platelets: 325 10*3/uL (ref 150–400)
RBC: 3.09 MIL/uL — ABNORMAL LOW (ref 3.87–5.11)
RDW: 16.5 % — ABNORMAL HIGH (ref 11.5–15.5)
WBC: 9.4 10*3/uL (ref 4.0–10.5)

## 2014-05-24 LAB — BASIC METABOLIC PANEL
BUN: 14 mg/dL (ref 6–23)
CO2: 26 meq/L (ref 19–32)
Calcium: 8.9 mg/dL (ref 8.4–10.5)
Chloride: 99 mEq/L (ref 96–112)
Creatinine, Ser: 1.07 mg/dL (ref 0.50–1.10)
GFR calc Af Amer: 72 mL/min — ABNORMAL LOW (ref >90)
GFR calc non Af Amer: 62 mL/min — ABNORMAL LOW (ref >90)
GLUCOSE: 86 mg/dL (ref 70–99)
POTASSIUM: 3.8 meq/L (ref 3.7–5.3)
Sodium: 141 mEq/L (ref 137–147)

## 2014-05-24 NOTE — Evaluation (Signed)
Physical Therapy Evaluation Patient Details Name: Erin Sexton MRN: [id number] DOB: [birth year] Today's Date: 05/24/2014   History of Present Illness  Pt admit for CHF.    Clinical Impression  Pt admitted with above. Pt currently with functional limitations due to the deficits listed below (see PT Problem List). Pt slightly orthostatic - see below.  Pt deconditioned and would benefit from Rehab to gain strength to go home.    Pt will benefit from skilled PT to increase their independence and safety with mobility to allow discharge to the venue listed below.      Follow Up Recommendations CIR;Supervision - Intermittent    Equipment Recommendations  Rolling walker with 5" wheels;3in1 (PT) (Bariatric equipment needed)    Recommendations for Other Services Rehab consult     Precautions / Restrictions Precautions Precautions: Fall Restrictions Weight Bearing Restrictions: No      Mobility  Bed Mobility Overal bed mobility: Needs Assistance Bed Mobility: Supine to Sit     Supine to sit: Min assist        Transfers Overall transfer level: Needs assistance   Transfers: Sit to/from Stand Sit to Stand: Min assist         General transfer comment: Needs cues and needs min steadying assist for standing.  Did not ambulate as pt dizzy and did appear to have orthostasis.   Ambulation/Gait                Stairs            Wheelchair Mobility    Modified Rankin (Stroke Patients Only)       Balance Overall balance assessment: Needs assistance;History of Falls Sitting-balance support: No upper extremity supported;Feet supported Sitting balance-Leahy Scale: Fair     Standing balance support: Single extremity supported;During functional activity Standing balance-Leahy Scale: Poor Standing balance comment: Needs steadying assist and bil UE support for standing.                               Pertinent Vitals/Pain Orthostatic BPs  Supine  146/74, 74 bpm  Sitting 140/82, 85 bpm  Standing 124/76, 82 bpm  Standing after 2 min Would not register  147/83 on departure with pt back in supine.      Home Living Family/patient expects to be discharged to:: Private residence Living Arrangements: Parent Available Help at Discharge: Family;Available 24 hours/day (supervision only) Type of Home: House Home Access: Stairs to enter Entrance Stairs-Rails: Right Entrance Stairs-Number of Steps: 4 Home Layout: One level Home Equipment: None      Prior Function Level of Independence: Independent               Hand Dominance   Dominant Hand: Right    Extremity/Trunk Assessment   Upper Extremity Assessment: Defer to OT evaluation           Lower Extremity Assessment: Generalized weakness         Communication   Communication: No difficulties  Cognition Arousal/Alertness: Awake/alert Behavior During Therapy: WFL for tasks assessed/performed Overall Cognitive Status: Within Functional Limits for tasks assessed                      General Comments      Exercises General Exercises - Lower Extremity Ankle Circles/Pumps: AROM;Both;10 reps;Seated Long Arc Quad: AROM;Both;10 reps;Seated      Assessment/Plan    PT Assessment Patient needs continued PT services  PT Diagnosis Generalized  weakness;Difficulty walking   PT Problem List Decreased activity tolerance;Decreased balance;Decreased mobility;Decreased knowledge of use of DME;Decreased safety awareness;Decreased knowledge of precautions;Decreased strength  PT Treatment Interventions DME instruction;Gait training;Functional mobility training;Therapeutic activities;Therapeutic exercise;Balance training;Patient/family education   PT Goals (Current goals can be found in the Care Plan section) Acute Rehab PT Goals Patient Stated Goal: to go home PT Goal Formulation: With patient Time For Goal Achievement: 05/31/14 Potential to Achieve Goals: Good     Frequency Min 3X/week   Barriers to discharge Decreased caregiver support Mom will be there for supervision but cannot physically assist pt.     Co-evaluation               End of Session Equipment Utilized During Treatment: Gait belt;Oxygen Activity Tolerance: Patient limited by fatigue Patient left: in bed;with call bell/phone within reach Nurse Communication: Mobility status         Time: 6415-8309 PT Time Calculation (min): 21 min   Charges:   PT Evaluation $Initial PT Evaluation Tier I: 1 Procedure PT Treatments $Therapeutic Activity: 8-22 mins   PT G Codes:          INGOLD,DAWN 05/25/2014, 11:53 AM  [facility] [phone] (pager)

## 2014-05-24 NOTE — Progress Notes (Signed)
MD Turner stated pt no longer needed telemetry monitoring. However, RN cannot find telemetry order to d/c. But, pt does not need to be monitored by telemetry any more and central telemetry notified.

## 2014-05-24 NOTE — Progress Notes (Addendum)
SUBJECTIVE:  Weak and dizzy when up ambulating  OBJECTIVE:   Vitals:   Filed Vitals:   05/23/14 1408 05/23/14 1652 05/23/14 2001 05/24/14 0536  BP: 112/66 124/76 107/72 117/68  Pulse: 75 67 68 75  Temp: 98 F (36.7 C)   98.6 F (37 C)  TempSrc: Oral   Oral  Resp: 18  18 18   Height:      Weight:    378 lb 1.6 oz (171.505 kg)  SpO2: 97%  99% 99%   I&O's:   Intake/Output Summary (Last 24 hours) at 05/24/14 0752 Last data filed at 05/24/14 0500  Gross per 24 hour  Intake    480 ml  Output   1600 ml  Net  -1120 ml   TELEMETRY: Reviewed telemetry pt in NSR:     PHYSICAL EXAM General: Well developed, well nourished, in no acute distress Head: Eyes PERRLA, No xanthomas.   Normal cephalic and atramatic  Lungs:   Clear bilaterally to auscultation and percussion. Heart:   HRRR S1 S2 Pulses are 2+ & equal. Abdomen: Bowel sounds are positive, abdomen soft and non-tender without masses  Extremities:   No clubbing, cyanosis or edema.  DP +1 Neuro: Alert and oriented X 3. Psych:  Good affect, responds appropriately   LABS: Basic Metabolic Panel:  Recent Labs  05/23/14 0348 05/24/14 0502  NA 142 141  K 3.8 3.8  CL 100 99  CO2 28 26  GLUCOSE 106* 86  BUN 14 14  CREATININE 1.05 1.07  CALCIUM 8.6 8.9   Liver Function Tests: No results found for this basename: AST, ALT, ALKPHOS, BILITOT, PROT, ALBUMIN,  in the last 72 hours No results found for this basename: LIPASE, AMYLASE,  in the last 72 hours CBC:  Recent Labs  05/23/14 0348 05/24/14 0502  WBC 9.9 9.4  HGB 8.2* 8.4*  HCT 25.8* 26.8*  MCV 87.2 86.7  PLT 327 325   Cardiac Enzymes: No results found for this basename: CKTOTAL, CKMB, CKMBINDEX, TROPONINI,  in the last 72 hours BNP: No components found with this basename: POCBNP,  D-Dimer: No results found for this basename: DDIMER,  in the last 72 hours Hemoglobin A1C: No results found for this basename: HGBA1C,  in the last 72 hours Fasting Lipid  Panel: No results found for this basename: CHOL, HDL, LDLCALC, TRIG, CHOLHDL, LDLDIRECT,  in the last 72 hours Thyroid Function Tests: No results found for this basename: TSH, T4TOTAL, FREET3, T3FREE, THYROIDAB,  in the last 72 hours Anemia Panel: No results found for this basename: VITAMINB12, FOLATE, FERRITIN, TIBC, IRON, RETICCTPCT,  in the last 72 hours Coag Panel:   Lab Results  Component Value Date   INR 1.66* 03/04/2014   INR 1.11 03/02/2014    RADIOLOGY: US Transvaginal Non-ob  05/15/2014   CLINICAL DATA:  Vaginal bleeding since March 2015.  EXAM: TRANSABDOMINAL AND TRANSVAGINAL ULTRASOUND OF PELVIS  TECHNIQUE: Both transabdominal and transvaginal ultrasound examinations of the pelvis were performed. Transabdominal technique was performed for global imaging of the pelvis including uterus, ovaries, adnexal regions, and pelvic cul-de-sac. It was necessary to proceed with endovaginal exam following the transabdominal exam to visualize the endometrium and ovaries.  COMPARISON:  03/06/2014  FINDINGS: Examination is somewhat limited due to patient body habitus.  Uterus  Measurements: 6.9 x 7.8 x 12.0 cm. No fibroids or other mass visualized.  Endometrium  Thickness: 27 mm (previously measured 15 mm). Thickened a moderately heterogeneous likely due in part to hemorrhagic debris in this  patient with moderate vaginal bleeding. Could not exclude an endometrial mass/polyp or hyperplasia.  Right ovary  Not visualized.  Left ovary  Not visualized.  Other findings  There is a large heterogeneous somewhat hyperechoic mass centered over the cervix measuring 6 x 6 x 6.9 cm.  IMPRESSION: Slightly enlarged uterus with moderately thickened heterogeneous endometrium measuring 27 mm which has increased in thickness compared to the prior exam. This is likely due in part to hemorrhagic debris in this patient with moderate vaginal bleeding, although cannot exclude a mass. New large heterogeneous echogenic masslike area  centered over the cervix measuring 6 x 6 x 6.9 cm likely a large region of clot. Recommend GYN consultation.  If bleeding remains unresponsive to hormonal or medical therapy, focal lesion work-up with sonohysterogram should be considered. Endometrial biopsy should also be considered in pre-menopausal patients at high risk for endometrial carcinoma. (Ref: Radiological Reasoning: Algorithmic Workup of Abnormal Vaginal Bleeding with Endovaginal Sonography and Sonohysterography. AJR 2008; 025:K27-06)  Nonvisualization of the ovaries.   Electronically Signed   By: Marin Olp M.D.   On: 05/15/2014 07:19   US Pelvis Complete  05/15/2014   CLINICAL DATA:  Vaginal bleeding since March 2015.  EXAM: TRANSABDOMINAL AND TRANSVAGINAL ULTRASOUND OF PELVIS  TECHNIQUE: Both transabdominal and transvaginal ultrasound examinations of the pelvis were performed. Transabdominal technique was performed for global imaging of the pelvis including uterus, ovaries, adnexal regions, and pelvic cul-de-sac. It was necessary to proceed with endovaginal exam following the transabdominal exam to visualize the endometrium and ovaries.  COMPARISON:  03/06/2014  FINDINGS: Examination is somewhat limited due to patient body habitus.  Uterus  Measurements: 6.9 x 7.8 x 12.0 cm. No fibroids or other mass visualized.  Endometrium  Thickness: 27 mm (previously measured 15 mm). Thickened a moderately heterogeneous likely due in part to hemorrhagic debris in this patient with moderate vaginal bleeding. Could not exclude an endometrial mass/polyp or hyperplasia.  Right ovary  Not visualized.  Left ovary  Not visualized.  Other findings  There is a large heterogeneous somewhat hyperechoic mass centered over the cervix measuring 6 x 6 x 6.9 cm.  IMPRESSION: Slightly enlarged uterus with moderately thickened heterogeneous endometrium measuring 27 mm which has increased in thickness compared to the prior exam. This is likely due in part to hemorrhagic debris  in this patient with moderate vaginal bleeding, although cannot exclude a mass. New large heterogeneous echogenic masslike area centered over the cervix measuring 6 x 6 x 6.9 cm likely a large region of clot. Recommend GYN consultation.  If bleeding remains unresponsive to hormonal or medical therapy, focal lesion work-up with sonohysterogram should be considered. Endometrial biopsy should also be considered in pre-menopausal patients at high risk for endometrial carcinoma. (Ref: Radiological Reasoning: Algorithmic Workup of Abnormal Vaginal Bleeding with Endovaginal Sonography and Sonohysterography. AJR 2008; 237:S28-31)  Nonvisualization of the ovaries.   Electronically Signed   By: Marin Olp M.D.   On: 05/15/2014 07:19   Dg Chest Port 1 View  05/18/2014   CLINICAL DATA:  Chest pain and shortness of breath  EXAM: PORTABLE CHEST - 1 VIEW  COMPARISON:  03/05/2014  FINDINGS: Cardiac shadow is mildly enlarged. Diffuse vascular congestion and parenchymal opacities are noted consistent with congestive failure. No pneumothorax or sizable effusion is seen.  IMPRESSION: Changes consistent with congestive failure.   Electronically Signed   By: Inez Catalina M.D.   On: 05/18/2014 14:33    ASSESSMENT AND PLAN  This 43 year-old female with  a hx of HOCM, COPD, DM, morbid obesity, SVT, NSVT and severe menorrhagia recently. She was admitted in 02/2014 with dyspnea and chest pain. CT of the chest was felt to demonstrate possible small bilateral pulmonary emboli. She was started on Xarelto with plans to anticoagulate for 3-6 mos. She was then readmitted to 3/28-3/31 with vaginal bleeding and profound anemia. She required transfusion with 4 units PRBCs. She was seen by pulmonology due to recent history of possible PE. Chest CT was reviewed and did not demonstrate evidence of pulmonary embolism. VQ scan was negative. Therefore, it was felt that she did not have a pulmonary embolism and anticoagulation was discontinued. She  was admitted again just a few days ago with to recurrent profuse vaginal bleeding and received transfusion of 6 units and was sent home 05/17/14 but remained short of breath and returned to [facility] later that night with worsening dyspnea. She was found to have acute pulmonary edema and reated with BiPAP and IV Lasix. She has a very mild elevation of troponin and was transferred to [facility] for further management.  Acute on chronic diastolic CHF- Likely exacerbated by volume of recent transfusion with 6 units in the setting of HOCM. Breathing much improved. -- Neg 9.7L; 400 lbs--> 378 lbs today  -- On Lasix 40mg  po bid.  Anemia- Improving. Last hg 8.4.  -- Ferratin 9. Iron 15.  -- Secondary to vaginal bleeding. Seen by Dr. Elly Modena with OB/GYN on 05/14/14 and placed on Premarin and Megace and iron.  -- No active bleeding.  Elevated troponin - explained in the setting of CHF (0.32)  --She has severe morbid obesity and was not felt to be a good candidate for noninvasive or invasive testing and has been managed conservatively.  -- Continue BB and statin  UTI- ecoli. Foley discontinued. On Ceftriaxone and Pyridium per IM.  HOCM  -- LVEF 65-70% with grade 2 diastolic dysfunction.  -- Continue Verapamil SR to 180 mg daily DM- Hg A1c 5.7  -- Continue metformin  HLD- continue statin  Weakness and Deconditioning - she got very weak and dizzy when up ambulating.  I will get a PT consult to see if she is a candidate for inpatient rehab for a few days. ? NSVT - I have reviewed the tele personally and the only abnormality was around 4:30pm where nurse read out [facility] but this is clearly artifact.  QRS complexes can be seen marching through it.  Sueanne Margarita, MD  05/24/2014  7:52 AM

## 2014-05-24 NOTE — Progress Notes (Signed)
Rehab Admissions Coordinator Note:  Patient was screened by Retta Diones for appropriateness for an Inpatient Acute Rehab Consult.  At this time, we are recommending Inpatient Rehab consult.  Retta Diones 05/24/2014, 2:29 PM  I can be reached at [phone].

## 2014-05-25 ENCOUNTER — Inpatient Hospital Stay (HOSPITAL_COMMUNITY)
Admission: RE | Admit: 2014-05-25 | Discharge: 2014-05-27 | DRG: 945 | Disposition: A | Discharge status: Home / Self Care | Payer: Medicare Other | Source: Intra-hospital | Attending: Physical Medicine & Rehabilitation | Admitting: Physical Medicine & Rehabilitation

## 2014-05-25 DIAGNOSIS — N390 Urinary tract infection, site not specified: Secondary | ICD-10-CM

## 2014-05-25 DIAGNOSIS — Z22322 Carrier or suspected carrier of Methicillin resistant Staphylococcus aureus: Secondary | ICD-10-CM | POA: Diagnosis not present

## 2014-05-25 DIAGNOSIS — D649 Anemia, unspecified: Secondary | ICD-10-CM | POA: Diagnosis not present

## 2014-05-25 DIAGNOSIS — F411 Generalized anxiety disorder: Secondary | ICD-10-CM | POA: Diagnosis not present

## 2014-05-25 DIAGNOSIS — E039 Hypothyroidism, unspecified: Secondary | ICD-10-CM

## 2014-05-25 DIAGNOSIS — N920 Excessive and frequent menstruation with regular cycle: Secondary | ICD-10-CM | POA: Diagnosis not present

## 2014-05-25 DIAGNOSIS — E669 Obesity, unspecified: Secondary | ICD-10-CM | POA: Diagnosis not present

## 2014-05-25 DIAGNOSIS — I5032 Chronic diastolic (congestive) heart failure: Secondary | ICD-10-CM

## 2014-05-25 DIAGNOSIS — Z5189 Encounter for other specified aftercare: Principal | ICD-10-CM

## 2014-05-25 DIAGNOSIS — I10 Essential (primary) hypertension: Secondary | ICD-10-CM

## 2014-05-25 DIAGNOSIS — I421 Obstructive hypertrophic cardiomyopathy: Secondary | ICD-10-CM

## 2014-05-25 DIAGNOSIS — I509 Heart failure, unspecified: Secondary | ICD-10-CM | POA: Diagnosis not present

## 2014-05-25 DIAGNOSIS — Z79899 Other long term (current) drug therapy: Secondary | ICD-10-CM

## 2014-05-25 DIAGNOSIS — D62 Acute posthemorrhagic anemia: Secondary | ICD-10-CM

## 2014-05-25 DIAGNOSIS — E1142 Type 2 diabetes mellitus with diabetic polyneuropathy: Secondary | ICD-10-CM | POA: Diagnosis not present

## 2014-05-25 DIAGNOSIS — R5381 Other malaise: Secondary | ICD-10-CM | POA: Diagnosis present

## 2014-05-25 DIAGNOSIS — I5033 Acute on chronic diastolic (congestive) heart failure: Secondary | ICD-10-CM

## 2014-05-25 DIAGNOSIS — I214 Non-ST elevation (NSTEMI) myocardial infarction: Secondary | ICD-10-CM

## 2014-05-25 DIAGNOSIS — E1149 Type 2 diabetes mellitus with other diabetic neurological complication: Secondary | ICD-10-CM

## 2014-05-25 DIAGNOSIS — F3289 Other specified depressive episodes: Secondary | ICD-10-CM

## 2014-05-25 DIAGNOSIS — F329 Major depressive disorder, single episode, unspecified: Secondary | ICD-10-CM | POA: Diagnosis not present

## 2014-05-25 DIAGNOSIS — A498 Other bacterial infections of unspecified site: Secondary | ICD-10-CM | POA: Diagnosis not present

## 2014-05-25 DIAGNOSIS — J449 Chronic obstructive pulmonary disease, unspecified: Secondary | ICD-10-CM

## 2014-05-25 DIAGNOSIS — J4489 Other specified chronic obstructive pulmonary disease: Secondary | ICD-10-CM

## 2014-05-25 LAB — GLUCOSE, CAPILLARY
GLUCOSE-CAPILLARY: 125 mg/dL — AB (ref 70–99)
GLUCOSE-CAPILLARY: 101 mg/dL — AB (ref 70–99)
Glucose-Capillary: 106 mg/dL — ABNORMAL HIGH (ref 70–99)
Glucose-Capillary: 129 mg/dL — ABNORMAL HIGH (ref 70–99)

## 2014-05-25 LAB — CBC
HEMATOCRIT: 28.6 % — AB (ref 36.0–46.0)
HEMOGLOBIN: 8.9 g/dL — AB (ref 12.0–15.0)
MCH: 27.1 pg (ref 26.0–34.0)
MCHC: 31.1 g/dL (ref 30.0–36.0)
MCV: 86.9 fL (ref 78.0–100.0)
Platelets: 339 10*3/uL (ref 150–400)
RBC: 3.29 MIL/uL — AB (ref 3.87–5.11)
RDW: 16.1 % — ABNORMAL HIGH (ref 11.5–15.5)
WBC: 8.5 10*3/uL (ref 4.0–10.5)

## 2014-05-25 LAB — BASIC METABOLIC PANEL
BUN: 12 mg/dL (ref 6–23)
CHLORIDE: 103 meq/L (ref 96–112)
CO2: 28 mEq/L (ref 19–32)
Calcium: 8.9 mg/dL (ref 8.4–10.5)
Creatinine, Ser: 0.93 mg/dL (ref 0.50–1.10)
GFR calc Af Amer: 85 mL/min — ABNORMAL LOW (ref >90)
GFR calc non Af Amer: 73 mL/min — ABNORMAL LOW (ref >90)
GLUCOSE: 99 mg/dL (ref 70–99)
POTASSIUM: 3.9 meq/L (ref 3.7–5.3)
Sodium: 143 mEq/L (ref 137–147)

## 2014-05-25 MED ORDER — METFORMIN HCL 500 MG PO TABS
500.0000 mg | ORAL_TABLET | Freq: Two times a day (BID) | ORAL | Status: DC
  Administered 2014-05-26 – 2014-05-27 (×3): 500 mg via ORAL
  Filled 2014-05-25 (×5): qty 1

## 2014-05-25 MED ORDER — ALUM & MAG HYDROXIDE-SIMETH 200-200-20 MG/5ML PO SUSP: 30.0000 mL | ORAL | Status: DC | PRN

## 2014-05-25 MED ORDER — VERAPAMIL HCL ER 180 MG PO TBCR
180.0000 mg | EXTENDED_RELEASE_TABLET | Freq: Every day | ORAL | Status: DC
  Administered 2014-05-26 – 2014-05-27 (×2): 180 mg via ORAL
  Filled 2014-05-25 (×3): qty 1

## 2014-05-25 MED ORDER — FERROUS SULFATE 325 (65 FE) MG PO TABS
325.0000 mg | ORAL_TABLET | Freq: Two times a day (BID) | ORAL | Status: DC
  Administered 2014-05-26 – 2014-05-27 (×3): 325 mg via ORAL
  Filled 2014-05-25 (×4): qty 1

## 2014-05-25 MED ORDER — ONDANSETRON HCL 4 MG PO TABS: 4.0000 mg | ORAL_TABLET | Freq: Four times a day (QID) | ORAL | Status: DC | PRN

## 2014-05-25 MED ORDER — FUROSEMIDE 40 MG PO TABS
40.0000 mg | ORAL_TABLET | Freq: Two times a day (BID) | ORAL | Status: DC
  Administered 2014-05-26 – 2014-05-27 (×3): 40 mg via ORAL
  Filled 2014-05-25 (×5): qty 1

## 2014-05-25 MED ORDER — LEVOTHYROXINE SODIUM 25 MCG PO TABS
25.0000 ug | ORAL_TABLET | Freq: Every day | ORAL | Status: DC
  Administered 2014-05-26 – 2014-05-27 (×2): 25 ug via ORAL
  Filled 2014-05-25 (×3): qty 1

## 2014-05-25 MED ORDER — INSULIN ASPART 100 UNIT/ML ~~LOC~~ SOLN: 0.0000 [IU] | Freq: Three times a day (TID) | SUBCUTANEOUS | Status: DC

## 2014-05-25 MED ORDER — MEGESTROL ACETATE 40 MG PO TABS
160.0000 mg | ORAL_TABLET | Freq: Every day | ORAL | Status: DC
  Administered 2014-05-26 – 2014-05-27 (×2): 160 mg via ORAL
  Filled 2014-05-25 (×3): qty 4

## 2014-05-25 MED ORDER — BUPROPION HCL ER (XL) 150 MG PO TB24
150.0000 mg | ORAL_TABLET | Freq: Every day | ORAL | Status: DC
  Administered 2014-05-26 – 2014-05-27 (×2): 150 mg via ORAL
  Filled 2014-05-25 (×3): qty 1

## 2014-05-25 MED ORDER — ACETAMINOPHEN 325 MG PO TABS: 650.0000 mg | ORAL_TABLET | Freq: Four times a day (QID) | ORAL | Status: DC | PRN

## 2014-05-25 MED ORDER — ATORVASTATIN CALCIUM 10 MG PO TABS
10.0000 mg | ORAL_TABLET | Freq: Every day | ORAL | Status: DC
  Administered 2014-05-26: 10 mg via ORAL
  Filled 2014-05-25 (×2): qty 1

## 2014-05-25 MED ORDER — METOPROLOL TARTRATE 25 MG PO TABS
25.0000 mg | ORAL_TABLET | Freq: Two times a day (BID) | ORAL | Status: DC
  Administered 2014-05-25 – 2014-05-27 (×4): 25 mg via ORAL
  Filled 2014-05-25 (×6): qty 1

## 2014-05-25 MED ORDER — FLUTICASONE PROPIONATE HFA 110 MCG/ACT IN AERO
2.0000 | INHALATION_SPRAY | Freq: Two times a day (BID) | RESPIRATORY_TRACT | Status: DC
  Administered 2014-05-25 – 2014-05-27 (×4): 2 via RESPIRATORY_TRACT
  Filled 2014-05-25 (×2): qty 12

## 2014-05-25 MED ORDER — VERAPAMIL HCL ER 180 MG PO TBCR: 180.0000 mg | EXTENDED_RELEASE_TABLET | Freq: Every day | ORAL | Status: DC

## 2014-05-25 MED ORDER — FUROSEMIDE 40 MG PO TABS: 40.0000 mg | ORAL_TABLET | Freq: Two times a day (BID) | ORAL | Status: DC

## 2014-05-25 MED ORDER — SODIUM CHLORIDE 0.9 % IJ SOLN: 3.0000 mL | INTRAMUSCULAR | Status: DC | PRN

## 2014-05-25 MED ORDER — SODIUM CHLORIDE 0.9 % IJ SOLN
3.0000 mL | Freq: Two times a day (BID) | INTRAMUSCULAR | Status: DC
  Administered 2014-05-25: 3 mL via INTRAVENOUS

## 2014-05-25 MED ORDER — CIPROFLOXACIN HCL 500 MG PO TABS: 500.0000 mg | ORAL_TABLET | Freq: Two times a day (BID) | ORAL | Status: DC

## 2014-05-25 MED ORDER — ALBUTEROL SULFATE (2.5 MG/3ML) 0.083% IN NEBU
2.5000 mg | INHALATION_SOLUTION | Freq: Four times a day (QID) | RESPIRATORY_TRACT | Status: DC | PRN
Start: 2014-05-25 — End: 2014-05-27

## 2014-05-25 MED ORDER — METOPROLOL TARTRATE 25 MG PO TABS
25.0000 mg | ORAL_TABLET | Freq: Two times a day (BID) | ORAL | Status: DC
Start: 2014-05-25 — End: 2014-05-27

## 2014-05-25 MED ORDER — CIPROFLOXACIN HCL 500 MG PO TABS
500.0000 mg | ORAL_TABLET | Freq: Two times a day (BID) | ORAL | Status: DC
  Administered 2014-05-25 – 2014-05-27 (×4): 500 mg via ORAL
  Filled 2014-05-25 (×6): qty 1

## 2014-05-25 MED ORDER — SORBITOL 70 % SOLN: 30.0000 mL | Freq: Every day | Status: DC | PRN

## 2014-05-25 MED ORDER — ONDANSETRON HCL 4 MG/2ML IJ SOLN: 4.0000 mg | Freq: Four times a day (QID) | INTRAMUSCULAR | Status: DC | PRN

## 2014-05-25 NOTE — H&P (Signed)
Physical Medicine and Rehabilitation Admission H&P    Chief Complaint  Patient presents with  . Chest Pain  : Chief Complaint :weakness  HPI: Erin Sexton is a 43 y.o. right-handed female with history of asthma, hypertension, diabetes mellitus with peripheral neuropathy, severe menorrhagia, morbid obesity, systolic congestive heart failure and hypertrophic cardiomyopathy. Independent prior to admission. Patient recently admitted with profuse vaginal bleeding receive transfusion of 6 units of packed red blood cells and discharge to home 05/17/2014. Patient readmitted 05/18/2014 with increasing shortness of breath. Chest x-ray consistent with congestive heart failure. Congestive heart failure felt likely exacerbated by volume of recent transfusion. Maintained on Lasix therapy as directed and followup per cardiology services. Mildly elevated troponin felt to be secondary to congestive heart failure not a candidate for invasive workup and management conservatively. A urine culture 05/20/2014 Escherichia coli maintained on Cipro. Hemoglobin monitored closely with latest hemoglobin 8.4. Contact precautions for MRSA PCR screening positive. Physical therapy evaluation completed 05/24/2014 secondary to deconditioning with recommendations for physical medicine rehabilitation consult. Admit for comprehensive rehabilitation program   ROS Review of Systems  Respiratory: Positive for shortness of breath.  Cardiovascular: Positive for palpitations.  Genitourinary:  Severe menorrhagia  Musculoskeletal: Positive for myalgias.  Psychiatric/Behavioral: Positive for depression.  Anxiety  All other systems reviewed and are negative  Past Medical History  Diagnosis Date  . COPD (chronic obstructive pulmonary disease)   . Depression   . Hypertension   . Asthma   . Anxiety   . Anemia   . Blood transfusion without reported diagnosis   . Diabetes mellitus without complication     a. Dx 02/2014.  [name]  HOCM (hypertrophic obstructive cardiomyopathy)     a. Dx 65465.  . Vaginal bleeding   . SVT (supraventricular tachycardia)     a. Noted on tele during 02/2014 adm for symptomatic anemia.  [name] NSVT (nonsustained ventricular tachycardia)     a. Noted on tele during 02/2014 adm for symptomatic anemia.  . Morbid obesity   . Abnormal CT scan     a. 02/2014: evaluated for dyspnea and initially placed on Xarelto for PE because radiologist could not exclude small peripheral pulmonary emboli because of limited contrast. However, after admission for ++vaginal bleeding several days later, it was felt she did NOT have PE - underwent further testing with neg VQ 03/06/14, neg LE duplex.   History reviewed. No pertinent past surgical history. Family History  Problem Relation Age of Onset  . CAD Neg Hx     No family history of EARLY CAD   Social History:  reports that she has never smoked. She has never used smokeless tobacco. She reports that she does not drink alcohol or use illicit drugs. Allergies: No Known Allergies Medications Prior to Admission  Medication Sig Dispense Refill  . albuterol (PROVENTIL HFA;VENTOLIN HFA) 108 (90 BASE) MCG/ACT inhaler Inhale 2 puffs into the lungs every 6 (six) hours as needed for wheezing or shortness of breath.      [name] albuterol (PROVENTIL) (2.5 MG/3ML) 0.083% nebulizer solution Take 3 mLs (2.5 mg total) by nebulization every 6 (six) hours as needed for wheezing or shortness of breath.  75 mL  12  . buPROPion (WELLBUTRIN XL) 150 MG 24 hr tablet Take 1 tablet (150 mg total) by mouth daily.  30 tablet  0  . ferrous sulfate 325 (65 FE) MG tablet Take 1 tablet (325 mg total) by mouth 2 (two) times daily with a meal.  60 tablet  0  . fluticasone (FLOVENT HFA) 110 MCG/ACT inhaler Inhale 2 puffs into the lungs 2 (two) times daily.  1 Inhaler  12  . levothyroxine (SYNTHROID, LEVOTHROID) 25 MCG tablet Take 25 mcg by mouth daily before breakfast.      . megestrol (MEGACE) 40 MG tablet  Take 4 tablets (160 mg total) by mouth daily.  120 tablet  1  . metFORMIN (GLUCOPHAGE) 500 MG tablet Take 1 tablet (500 mg total) by mouth 2 (two) times daily with a meal.  60 tablet  0  . pravastatin (PRAVACHOL) 20 MG tablet Take 1 tablet (20 mg total) by mouth daily.  30 tablet  1    Home: Home Living Family/patient expects to be discharged to:: Private residence Living Arrangements: Parent Available Help at Discharge: Family;Available 24 hours/day (supervision only) Type of Home: House Home Access: Stairs to enter [employer] of Steps: 4 Entrance Stairs-Rails: Right Home Layout: One level Home Equipment: None   Functional History: Prior Function Level of Independence: Independent  Functional Status:  Mobility: Bed Mobility Overal bed mobility: Needs Assistance Bed Mobility: Supine to Sit Supine to sit: Min assist Transfers Overall transfer level: Needs assistance Transfers: Sit to/from Stand Sit to Stand: Min assist General transfer comment: Needs cues and needs min steadying assist for standing.  Did not ambulate as pt dizzy and did appear to have orthostasis.       ADL:    Cognition: Cognition Overall Cognitive Status: Within Functional Limits for tasks assessed Orientation Level: Oriented X4 Cognition Arousal/Alertness: Awake/alert Behavior During Therapy: WFL for tasks assessed/performed Overall Cognitive Status: Within Functional Limits for tasks assessed  Physical Exam: Blood pressure 125/73, pulse 77, temperature 98.1 F (36.7 C), temperature source Oral, resp. rate 18, height $RemoveBe'5\' 9"'suKrQwXob$  (1.753 m), weight 171.414 kg (377 lb 14.4 oz), SpO2 98.00%. Physical Exam Constitutional: She is oriented to person, place, and time.  43 year old morbidly obese female resting comfortably.  HENT: oral mucosa pink, moist, dentition normal Head: Normocephalic.  Eyes: EOM are normal.  Neck: Normal range of motion. Neck supple. No thyromegaly present.  Cardiac:  regular rhythm. Systolic murmur. Limbs without edema Respiratory: Effort normal and breath sounds normal.  GI: Soft. Bowel sounds are normal. She exhibits no distension. There is no tenderness.   Skin: Skin is warm and dry.  Psychiatric: She has a normal mood and affect.  Neuro: alert and oriented x 3. CN exam normal. Normal DTR'smotor strength is 4+ bilateral deltoids, 5/5 bilateral biceps triceps grip, 3  bilateral hip flexors limited by panniculus, 4/5 bilateral knee extensors ankle dorsiflexors  Sensory intact to light touch in both lower extremity  Results for orders placed during the hospital encounter of 05/18/14 (from the past 48 hour(s))  GLUCOSE, CAPILLARY     Status: None   Collection Time    05/23/14  4:07 PM      Result Value Ref Range   Glucose-Capillary 98  70 - 99 mg/dL  GLUCOSE, CAPILLARY     Status: None   Collection Time    05/23/14 10:14 PM      Result Value Ref Range   Glucose-Capillary 84  70 - 99 mg/dL  CBC     Status: Abnormal   Collection Time    05/24/14  5:02 AM      Result Value Ref Range   WBC 9.4  4.0 - 10.5 K/uL   RBC 3.09 (*) 3.87 - 5.11 MIL/uL   Hemoglobin 8.4 (*) 12.0 - 15.0 g/dL  HCT 26.8 (*) 36.0 - 46.0 %   MCV 86.7  78.0 - 100.0 fL   MCH 27.2  26.0 - 34.0 pg   MCHC 31.3  30.0 - 36.0 g/dL   RDW 16.5 (*) 11.5 - 15.5 %   Platelets 325  150 - 400 K/uL  BASIC METABOLIC PANEL     Status: Abnormal   Collection Time    05/24/14  5:02 AM      Result Value Ref Range   Sodium 141  137 - 147 mEq/L   Potassium 3.8  3.7 - 5.3 mEq/L   Chloride 99  96 - 112 mEq/L   CO2 26  19 - 32 mEq/L   Glucose, Bld 86  70 - 99 mg/dL   BUN 14  6 - 23 mg/dL   Creatinine, Ser 1.07  0.50 - 1.10 mg/dL   Calcium 8.9  8.4 - 10.5 mg/dL   GFR calc non Af Amer 62 (*) >90 mL/min   GFR calc Af Amer 72 (*) >90 mL/min   Comment: (NOTE)     The eGFR has been calculated using the CKD EPI equation.     This calculation has not been validated in all clinical situations.      eGFR's persistently <90 mL/min signify possible Chronic Kidney     Disease.  GLUCOSE, CAPILLARY     Status: None   Collection Time    05/24/14  6:32 AM      Result Value Ref Range   Glucose-Capillary 90  70 - 99 mg/dL  GLUCOSE, CAPILLARY     Status: None   Collection Time    05/24/14 11:30 AM      Result Value Ref Range   Glucose-Capillary 82  70 - 99 mg/dL  GLUCOSE, CAPILLARY     Status: Abnormal   Collection Time    05/24/14  4:28 PM      Result Value Ref Range   Glucose-Capillary 101 (*) 70 - 99 mg/dL  GLUCOSE, CAPILLARY     Status: Abnormal   Collection Time    05/24/14  9:26 PM      Result Value Ref Range   Glucose-Capillary 122 (*) 70 - 99 mg/dL   Comment 1 Notify RN    CBC     Status: Abnormal   Collection Time    05/25/14  6:46 AM      Result Value Ref Range   WBC 8.5  4.0 - 10.5 K/uL   RBC 3.29 (*) 3.87 - 5.11 MIL/uL   Hemoglobin 8.9 (*) 12.0 - 15.0 g/dL   HCT 28.6 (*) 36.0 - 46.0 %   MCV 86.9  78.0 - 100.0 fL   MCH 27.1  26.0 - 34.0 pg   MCHC 31.1  30.0 - 36.0 g/dL   RDW 16.1 (*) 11.5 - 15.5 %   Platelets 339  150 - 400 K/uL  BASIC METABOLIC PANEL     Status: Abnormal   Collection Time    05/25/14  6:46 AM      Result Value Ref Range   Sodium 143  137 - 147 mEq/L   Potassium 3.9  3.7 - 5.3 mEq/L   Chloride 103  96 - 112 mEq/L   CO2 28  19 - 32 mEq/L   Glucose, Bld 99  70 - 99 mg/dL   BUN 12  6 - 23 mg/dL   Creatinine, Ser 0.93  0.50 - 1.10 mg/dL   Calcium 8.9  8.4 - 10.5 mg/dL  GFR calc non Af Amer 73 (*) >90 mL/min   GFR calc Af Amer 85 (*) >90 mL/min   Comment: (NOTE)     The eGFR has been calculated using the CKD EPI equation.     This calculation has not been validated in all clinical situations.     eGFR's persistently <90 mL/min signify possible Chronic Kidney     Disease.  GLUCOSE, CAPILLARY     Status: Abnormal   Collection Time    05/25/14  6:48 AM      Result Value Ref Range   Glucose-Capillary 106 (*) 70 - 99 mg/dL  GLUCOSE, CAPILLARY      Status: Abnormal   Collection Time    05/25/14 11:14 AM      Result Value Ref Range   Glucose-Capillary 125 (*) 70 - 99 mg/dL   No results found.     Medical Problem List and Plan: 1. Functional deficits secondary to deconditioning after acute exacerbation of chronic diastolic congestive heart failure 2.  DVT Prophylaxis/Anticoagulation: SCD. Monitor for any signs of DVT 3. Pain Management: Tylenol as needed. Monitor the increased mobility 4. Mood/depression: Wellbutrin 150 mg daily. Provide emotional support 5. Neuropsych: This patient is capable of making decisions on her own behalf. 6. Diabetes mellitus with peripheral neuropathy. Latest hemoglobin A1c 5.7. Glucophage 500 mg twice a day. Check blood sugars a.c. and at bedtime. Diabetic teaching 7.Severe menorrhagia. Patient transfuse 6 units total packed red blood cells after recent admission. Followup with Dr. Elly Modena with OB/GYN. Continue Megace per OB/GYN 8. Anemia. Improved with latest hemoglobin 8.9. Ferritin 9 iron 15. Continue iron supplement. Followup CBC 9. Hypertension. Verapamil 180 mg daily, Lopressor 25 mg twice a day, Lasix 40 mg twice a day. Monitor for any signs of fluid overload. 10. Escherichia coli urinary tract infection. Cipro 500 mg twice a day initiated 05/23/2014. 11. Hypothyroidism. Synthroid 12. MRSA PCR screening positive. Continue contact precautions   Post Admission Physician Evaluation: 1. Functional deficits secondary  to deconditioning related to CHF, morbid obesity. 2. Patient is admitted to receive collaborative, interdisciplinary care between the physiatrist, rehab nursing staff, and therapy team. 3. Patient's level of medical complexity and substantial therapy needs in context of that medical necessity cannot be provided at a lesser intensity of care such as a SNF. 4. Patient has experienced substantial functional loss from his/her baseline which was documented above under the "Functional  History" and "Functional Status" headings.  Judging by the patient's diagnosis, physical exam, and functional history, the patient has potential for functional progress which will result in measurable gains while on inpatient rehab.  These gains will be of substantial and practical use upon discharge  in facilitating mobility and self-care at the household level. 5. Physiatrist will provide 24 hour management of medical needs as well as oversight of the therapy plan/treatment and provide guidance as appropriate regarding the interaction of the two. 6. 24 hour rehab nursing will assist with bladder management, bowel management, safety, skin/wound care, disease management, medication administration, pain management and patient education  and help integrate therapy concepts, techniques,education, etc. 7. PT will assess and treat for/with: Lower extremity strength, range of motion, balance, functional mobility, safety, adaptive techniques and equipment, CV stamina, pain control, potential edema mgt .   Goals are: mod I. 8. OT will assess and treat for/with: ADL's, functional mobility, safety, upper extremity strength, adaptive techniques and equipment, CV stamina, pain control, egosupport.   Goals are: mod I. 9. SLP will assess and treat  for/with: n/a.  Goals are: n/a. 10. Case Management and Social Worker will assess and treat for psychological issues and discharge planning. 11. Team conference will be held weekly to assess progress toward goals and to determine barriers to discharge. 12. Patient will receive at least 3 hours of therapy per day at least 5 days per week. 13. ELOS: 7 days       14. Prognosis:  excellent     Meredith Staggers, MD, [place]   05/25/2014

## 2014-05-25 NOTE — Progress Notes (Signed)
Erin Sexton Rehab Admission Coordinator Signed Physical Medicine and Rehabilitation PMR Pre-admission Service date: 05/25/2014 2:13 PM  Related encounter: Admission (Current) from 05/18/2014 in [place]   PMR Admission Coordinator Pre-Admission Assessment  Patient: Erin Sexton is an 43 y.o., female  MRN: [id number]  DOB: 1971/04/27  Height: 5\' 9"  (175.3 cm)  Weight: 171.414 kg (377 lb 14.4 oz)  Insurance Information  HMO: PPO: PCP: IPA: 80/20: OTHER:  PRIMARY: Medicare A and B Policy#: [id number] Subscriber: self  CM Name: Phone#: Fax#:  Pre-Cert#: Employer: Not employed  Benefits: Phone #: Name:  Eff. Date: 08/09/2008 Deduct: $1260 Out of Pocket Max: no Life Max: no  CIR: 100% SNF: 100% first 20 days per Medicare guidelines  Outpatient: 80% Co-Pay: 20%  Home Health: 100% per Medicare guidelines Co-Pay:  DME: 80% Co-Pay: 20%  Providers: Pt. choice SECONDARY: Policy#: Subscriber:  CM Name: Phone#: Fax#:  Pre-Cert#: Employer:  Benefits: Phone #: Name:  Eff. Date: Deduct: Out of Pocket Max: Life Max:  CIR: SNF:  Outpatient: Co-Pay:  Home Health: Co-Pay:  DME: Co-Pay:  Medicaid Application Date: Case Manager:  Disability Application Date: Case Worker:  Emergency Contact Information    Contact Information     Name  Relation  Home  Work  Mobile     Sexton,Erin  Mother  [phone]       Erin Sexton  [phone]          Current Medical History  Patient Admitting Diagnosis: Deconditioning after acute exacerbation of chronic diastolic congestive heart failure  History of Present Illness: Erin Sexton is a 43 y.o. right-handed female with history of asthma, hypertension, diabetes mellitus with peripheral neuropathy, severe menorrhagia, morbid obesity, systolic congestive heart failure and hypertrophic cardiomyopathy. Independent prior to admission. Patient recently admitted with profuse vaginal bleeding receive transfusion  of 6 units of packed red blood cells and discharge to home 05/17/2014. Patient readmitted 05/18/2014 with increasing shortness of breath. Chest x-ray consistent with congestive heart failure. Congestive heart failure felt likely exacerbated by volume of recent transfusion. Maintained on Lasix therapy as directed and followup per cardiology services. Mildly elevated troponin felt to be secondary to congestive heart failure not a candidate for invasive workup and management conservatively. A urine culture 05/20/2014 Escherichia coli maintained on Cipro. Hemoglobin monitored closely with latest hemoglobin 8.4. Contact precautions for MRSA PCR screening positive. Physical therapy evaluation completed 05/24/2014 secondary to deconditioning with recommendations for physical medicine rehabilitation consult. Admit for comprehensive rehabilitation program    Past Medical History    Past Medical History    Diagnosis  Date    .  COPD (chronic obstructive pulmonary disease)     .  Depression     .  Hypertension     .  Asthma     .  Anxiety     .  Anemia     .  Blood transfusion without reported diagnosis     .  Diabetes mellitus without complication       a. Dx 02/2014.    [name]  HOCM (hypertrophic obstructive cardiomyopathy)       a. Dx 62947.    .  Vaginal bleeding     .  SVT (supraventricular tachycardia)       a. Noted on tele during 02/2014 adm for symptomatic anemia.    [name]  NSVT (nonsustained ventricular tachycardia)       a. Noted on tele during 02/2014 adm for symptomatic anemia.    [name]  Morbid obesity     .  Abnormal CT scan       a. 02/2014: evaluated for dyspnea and initially placed on Xarelto for PE because radiologist could not exclude small peripheral pulmonary emboli because of limited contrast. However, after admission for ++vaginal bleeding several days later, it was felt she did NOT have PE - underwent further testing with neg VQ 03/06/14, neg LE duplex.     Family History  family history is  negative for CAD.  Prior Rehab/Hospitalizations: no prior rehab; several recent hospitalizations since March 2015  Current Medications  Current facility-administered medications:acetaminophen (TYLENOL) tablet 650 mg, 650 mg, Oral, Q6H PRN, Pixie Casino, MD, 650 mg at 05/23/14 1415; albuterol (PROVENTIL) (2.5 MG/3ML) 0.083% nebulizer solution 2.5 mg, 2.5 mg, Nebulization, Q6H PRN, Jacolyn Reedy, MD, 2.5 mg at 05/20/14 2048; alum & mag hydroxide-simeth (MAALOX/MYLANTA) 200-200-20 MG/5ML suspension 30 mL, 30 mL, Oral, Q4H PRN, Rogelia Mire, NP, 30 mL at 05/21/14 1251  atorvastatin (LIPITOR) tablet 10 mg, 10 mg, Oral, q1800, Mihai Croitoru, MD, 10 mg at 05/24/14 1737; buPROPion (WELLBUTRIN XL) 24 hr tablet 150 mg, 150 mg, Oral, Daily, Jacolyn Reedy, MD, 150 mg at 05/25/14 0930; ciprofloxacin (CIPRO) tablet 500 mg, 500 mg, Oral, BID, Jessica U Vann, DO, 500 mg at 05/25/14 0900; ferrous sulfate tablet 325 mg, 325 mg, Oral, BID WC, Jacolyn Reedy, MD, 325 mg at 05/25/14 1230  fluticasone (FLOVENT HFA) 110 MCG/ACT inhaler 2 puff, 2 puff, Inhalation, BID, Jacolyn Reedy, MD, 2 puff at 05/25/14 0734; furosemide (LASIX) tablet 40 mg, 40 mg, Oral, BID, Satira Sark, MD, 40 mg at 05/25/14 0900; insulin aspart (novoLOG) injection 0-20 Units, 0-20 Units, Subcutaneous, TID WC, Jacolyn Reedy, MD, 3 Units at 05/25/14 1231  levothyroxine (SYNTHROID, LEVOTHROID) tablet 25 mcg, 25 mcg, Oral, QAC breakfast, Jacolyn Reedy, MD, 25 mcg at 05/25/14 [phone]; megestrol (MEGACE) tablet 160 mg, 160 mg, Oral, Daily, Jacolyn Reedy, MD, 160 mg at 05/25/14 3220; metFORMIN (GLUCOPHAGE) tablet 500 mg, 500 mg, Oral, BID WC, Jacolyn Reedy, MD, 500 mg at 05/25/14 0800; metoprolol tartrate (LOPRESSOR) tablet 25 mg, 25 mg, Oral, BID, Belva Crome III, MD, 25 mg at 05/25/14 2542  morphine 2 MG/ML injection 2 mg, 2 mg, Intravenous, Q4H PRN, Rogelia Mire, NP, 2 mg at 05/23/14 1945; ondansetron (ZOFRAN)  injection 4 mg, 4 mg, Intravenous, Q8H PRN, Liliane Shi, PA-C, 4 mg at 05/21/14 2020; sodium chloride 0.9 % injection 3 mL, 3 mL, Intravenous, Q12H, Mihai Croitoru, MD, 3 mL at 05/25/14 1100; sodium chloride 0.9 % injection 3 mL, 3 mL, Intravenous, PRN, Mihai Croitoru, MD  verapamil (CALAN-SR) CR tablet 180 mg, 180 mg, Oral, Daily, Jeronimo Norma, RPH, 180 mg at 05/25/14 7062  Patients Current Diet: Carb modified, reduced sodium  Precautions / Restrictions  Precautions  Precautions: Fall  Restrictions  Weight Bearing Restrictions: No  Prior Activity Level  Community (5-7x/wk): Prior to March 2015, pt was independent , driving , shopping, and visiting friends and neighbors.  Home Assistive Devices / Equipment  Home Assistive Devices/Equipment: Nebulizer  Home Equipment: None  Prior Functional Level  Prior Function  Level of Independence: Independent  Current Functional Level    Cognition  Overall Cognitive Status: Within Functional Limits for tasks assessed  Orientation Level: Oriented X4    Extremity Assessment  (includes Sensation/Coordination)      ADLs     Mobility  Overal bed mobility: Needs Assistance  Bed Mobility:  Supine to Sit  Supine to sit: Min assist    Transfers  Overall transfer level: Needs assistance  Equipment used: Rolling walker (2 wheeled)  Transfers: Sit to/from Stand  Sit to Stand: Min guard  General transfer comment: Needs cues and needs min steadying assist for standing.    Ambulation / Gait / Stairs / Wheelchair Mobility  Ambulation/Gait  Ambulation/Gait assistance: Min guard  Ambulation Distance (Feet): 50 Feet (15 feet, 15 feet and then 20 feet with sitting rest breaks)  Assistive device: Rolling walker (2 wheeled)  Gait Pattern/deviations: Step-through pattern;Decreased stride length;Wide base of support;Trunk flexed  Gait velocity interpretation: Below normal speed for age/gender  General Gait Details: Pt ambulated fairly well with RW but had to take  several sitting rest breaks due to dizziness after about 15 to 20 feet. Hgb still low therefore unsure if that is causing dizziness/fatigue and short endurance.    Posture / Balance     Special needs/care consideration  BiPAP/CPAP No; pt. Reports she does have sleep apnea but is claustrophobic and could not tolerate the face mask. She is scheduled for follow up sleep study in July.  CPM no  Continuous Drip IV no  Dialysis no  Life Vest no  Oxygen no  Special Bed Yes, "Mighty Air" bariatric bed  Trach Size no  Wound Vac (area) no  Skin Pt. denies issues  Bowel mgmt: Taking colace, last BM 05/23/14  Bladder mgmt: continent  Diabetic mgmt Yes, see meds list    Previous Home Environment  Living Arrangements: Parent  Lives With: Other (Comment) (pt. and her mom live together)  Available Help at Discharge: Family;Available 24 hours/day (supervision only)  Type of Home: House  Home Layout: One level  Home Access: Stairs to enter  Entrance Stairs-Rails: Right  Entrance Stairs-Number of Steps: 4  Bathroom Shower/Tub: Tourist information centre manager: Standard  Bathroom Accessibility: Yes  How Accessible: Accessible via walker  [place]: No  Discharge Living Setting  Plans for Discharge Living Setting: Patient's home  Type of Home at Discharge: House  Discharge Home Layout: One level  Discharge Home Access: Stairs to enter  Entrance Stairs-Rails: Right  Entrance Stairs-Number of Steps: 4  Discharge Bathroom Shower/Tub: Walk-in shower  Discharge Bathroom Toilet: Standard  Discharge [place] Accessibility: Yes  How Accessible: Accessible via walker  Does the patient have any problems obtaining your medications?: No  Social/Family/Support Systems  Patient Roles: Other (Comment) (daugter, sister and aunt)  Anticipated Caregiver: Joanell Rising, mother  Anticipated Caregiver's Contact Information: [phone]  Caregiver Availability: 24/7  Discharge Plan Discussed with Primary  Caregiver: Yes  Is Caregiver In Agreement with Plan?: Yes  Does Caregiver/Family have Issues with Lodging/Transportation while Pt is in Rehab?: No  Goals/Additional Needs  Patient/Family Goal for Rehab: modified independent PT and OT  Expected length of stay: 7 to 10 days  Cultural Considerations: none  Dietary Needs: carb modified, Na restrictions  Equipment Needs: TBD  Pt/Family Agrees to Admission and willing to participate: Yes  Program Orientation Provided & Reviewed with Pt/Caregiver Including Roles & Responsibilities: Yes  Decrease burden of Care through IP rehab admission:  no  Possible need for SNF placement upon discharge: Not anticipated  Patient Condition: This patient's condition remains as documented in the consult dated 05/25/14 , in which the Rehabilitation Physician determined and documented that the patient's condition is appropriate for intensive rehabilitative care in an inpatient rehabilitation facility. Will admit to inpatient rehab today.  Preadmission Screen Completed By:  Erin Sexton PT, 05/25/2014 2:26 PM  ______________________________________________________________________  Discussed status with Dr. Naaman Plummer on 05/25/14 at 1424 and received telephone approval for admission today.  Admission Coordinator: Erin Sexton, PT time [address] 05/25/14    Cosigned by: Meredith Staggers, MD [05/25/2014 2:42 PM]

## 2014-05-25 NOTE — Consult Note (Signed)
Physical Medicine and Rehabilitation Consult Reason for Consult: Deconditioning/acute on chronic congestive heart failure/multi-medical Referring Physician: Dr. Sallyanne Kuster   HPI: Erin Sexton is a 43 y.o. right-handed female with history of asthma, hypertension, diabetes mellitus with peripheral neuropathy, severe menorrhagia, morbid obesity, systolic congestive heart failure and hypertrophic cardiomyopathy. Patient recently admitted with profuse vaginal bleeding receive transfusion of 6 units of packed red blood cells and discharge to home 05/17/2014. Patient readmitted 05/18/2014 with increasing shortness of breath. Chest x-ray consistent with congestive heart failure. Congestive heart failure felt likely exacerbated by volume of recent transfusion. Maintained on Lasix therapy as directed and followup per cardiology services. Mildly elevated troponin felt to be secondary to congestive heart failure not a candidate for invasive workup and management conservatively. A urine culture 05/20/2014 Escherichia coli maintained on Cipro. Hemoglobin monitored closely with latest hemoglobin 8.4. Contact precautions for MRSA PCR screening positive. Physical therapy evaluation completed 05/24/2014 secondary to deconditioning with recommendations for physical medicine rehabilitation consult.  Patient states that prior to admission she is independent with out assistive device. Review of Systems  Respiratory: Positive for shortness of breath.   Cardiovascular: Positive for palpitations.  Genitourinary:       Severe menorrhagia  Musculoskeletal: Positive for myalgias.  Psychiatric/Behavioral: Positive for depression.       Anxiety  All other systems reviewed and are negative.  Past Medical History  Diagnosis Date  . COPD (chronic obstructive pulmonary disease)   . Depression   . Hypertension   . Asthma   . Anxiety   . Anemia   . Blood transfusion without reported diagnosis   . Diabetes mellitus  without complication     a. Dx 02/2014.  [name] HOCM (hypertrophic obstructive cardiomyopathy)     a. Dx 67124.  . Vaginal bleeding   . SVT (supraventricular tachycardia)     a. Noted on tele during 02/2014 adm for symptomatic anemia.  [name] NSVT (nonsustained ventricular tachycardia)     a. Noted on tele during 02/2014 adm for symptomatic anemia.  . Morbid obesity   . Abnormal CT scan     a. 02/2014: evaluated for dyspnea and initially placed on Xarelto for PE because radiologist could not exclude small peripheral pulmonary emboli because of limited contrast. However, after admission for ++vaginal bleeding several days later, it was felt she did NOT have PE - underwent further testing with neg VQ 03/06/14, neg LE duplex.   History reviewed. No pertinent past surgical history. Family History  Problem Relation Age of Onset  . CAD Neg Hx     No family history of EARLY CAD   Social History:  reports that she has never smoked. She has never used smokeless tobacco. She reports that she does not drink alcohol or use illicit drugs. Allergies: No Known Allergies Medications Prior to Admission  Medication Sig Dispense Refill  . albuterol (PROVENTIL HFA;VENTOLIN HFA) 108 (90 BASE) MCG/ACT inhaler Inhale 2 puffs into the lungs every 6 (six) hours as needed for wheezing or shortness of breath.      [name] albuterol (PROVENTIL) (2.5 MG/3ML) 0.083% nebulizer solution Take 3 mLs (2.5 mg total) by nebulization every 6 (six) hours as needed for wheezing or shortness of breath.  75 mL  12  . buPROPion (WELLBUTRIN XL) 150 MG 24 hr tablet Take 1 tablet (150 mg total) by mouth daily.  30 tablet  0  . ferrous sulfate 325 (65 FE) MG tablet Take 1 tablet (325 mg total) by mouth 2 (  two) times daily with a meal.  60 tablet  0  . fluticasone (FLOVENT HFA) 110 MCG/ACT inhaler Inhale 2 puffs into the lungs 2 (two) times daily.  1 Inhaler  12  . levothyroxine (SYNTHROID, LEVOTHROID) 25 MCG tablet Take 25 mcg by mouth daily before  breakfast.      . megestrol (MEGACE) 40 MG tablet Take 4 tablets (160 mg total) by mouth daily.  120 tablet  1  . metFORMIN (GLUCOPHAGE) 500 MG tablet Take 1 tablet (500 mg total) by mouth 2 (two) times daily with a meal.  60 tablet  0  . pravastatin (PRAVACHOL) 20 MG tablet Take 1 tablet (20 mg total) by mouth daily.  30 tablet  1    Home: Home Living Family/patient expects to be discharged to:: Private residence Living Arrangements: Parent Available Help at Discharge: Family;Available 24 hours/day (supervision only) Type of Home: House Home Access: Stairs to enter [employer] of Steps: 4 Entrance Stairs-Rails: Right Home Layout: One level Home Equipment: None  Functional History: Prior Function Level of Independence: Independent Functional Status:  Mobility: Bed Mobility Overal bed mobility: Needs Assistance Bed Mobility: Supine to Sit Supine to sit: Min assist Transfers Overall transfer level: Needs assistance Transfers: Sit to/from Stand Sit to Stand: Min assist General transfer comment: Needs cues and needs min steadying assist for standing.  Did not ambulate as pt dizzy and did appear to have orthostasis.       ADL:    Cognition: Cognition Overall Cognitive Status: Within Functional Limits for tasks assessed Orientation Level: Oriented X4 Cognition Arousal/Alertness: Awake/alert Behavior During Therapy: WFL for tasks assessed/performed Overall Cognitive Status: Within Functional Limits for tasks assessed  Blood pressure 125/73, pulse 69, temperature 98.1 F (36.7 C), temperature source Oral, resp. rate 18, height 5\' 9"  (1.753 m), weight 171.414 kg (377 lb 14.4 oz), SpO2 99.00%. Physical Exam  Vitals reviewed. Constitutional: She is oriented to person, place, and time.  43 year old morbidly obese female resting comfortably.  HENT:  Head: Normocephalic.  Eyes: EOM are normal.  Neck: Normal range of motion. Neck supple. No thyromegaly present.    Respiratory: Effort normal and breath sounds normal.  GI: Soft. Bowel sounds are normal. She exhibits no distension. There is no tenderness.  Neurological: She is alert and oriented to person, place, and time.  Skin: Skin is warm and dry.  Psychiatric: She has a normal mood and affect.   motor strength is 4/5 bilateral deltoids, 5/5 bilateral biceps triceps grip, 3 minus bilateral hip flexors limited by panniculus, 4/5 bilateral knee extensors ankle dorsiflexors Sensory intact to light touch in both lower extremity  Results for orders placed during the hospital encounter of 05/18/14 (from the past 24 hour(s))  GLUCOSE, CAPILLARY     Status: None   Collection Time    05/24/14  6:32 AM      Result Value Ref Range   Glucose-Capillary 90  70 - 99 mg/dL  GLUCOSE, CAPILLARY     Status: None   Collection Time    05/24/14 11:30 AM      Result Value Ref Range   Glucose-Capillary 82  70 - 99 mg/dL  GLUCOSE, CAPILLARY     Status: Abnormal   Collection Time    05/24/14  4:28 PM      Result Value Ref Range   Glucose-Capillary 101 (*) 70 - 99 mg/dL  GLUCOSE, CAPILLARY     Status: Abnormal   Collection Time    05/24/14  9:26 PM  Result Value Ref Range   Glucose-Capillary 122 (*) 70 - 99 mg/dL   Comment 1 Notify RN     No results found.  Assessment/Plan: Diagnosis: Deconditioning after acute exacerbation of chronic diastolic congestive heart failure 1. Does the need for close, 24 hr/day medical supervision in concert with the patient's rehab needs make it unreasonable for this patient to be served in a less intensive setting? Yes 2. Co-Morbidities requiring supervision/potential complications: Morbid obesity, hypertension, sleep apnea, diabetes mellitus type 2, vaginal bleeding 3. Due to bladder management, bowel management, safety, skin/wound care, disease management, medication administration and patient education, does the patient require 24 hr/day rehab nursing? Yes 4. Does the  patient require coordinated care of a physician, rehab nurse, PT (1-2 hrs/day, 5 days/week) and OT (1-2 hrs/day, 5 days/week) to address physical and functional deficits in the context of the above medical diagnosis(es)? Yes Addressing deficits in the following areas: balance, endurance, locomotion, strength, transferring, bowel/bladder control, bathing, dressing, feeding, grooming and toileting 5. Can the patient actively participate in an intensive therapy program of at least 3 hrs of therapy per day at least 5 days per week? Yes 6. The potential for patient to make measurable gains while on inpatient rehab is excellent 7. Anticipated functional outcomes upon discharge from inpatient rehab are modified independent  with PT, modified independent with OT, n/a with SLP. 8. Estimated rehab length of stay to reach the above functional goals is: 7-10 days 9. Does the patient have adequate social supports to accommodate these discharge functional goals? Yes 10. Anticipated D/C setting: Home 11. Anticipated post D/C treatments: [place] therapy 12. Overall Rehab/Functional Prognosis: excellent  RECOMMENDATIONS: This patient's condition is appropriate for continued rehabilitative care in the following setting: CIR Patient has agreed to participate in recommended program. Yes Note that insurance prior authorization may be required for reimbursement for recommended care.  Comment:     05/25/2014

## 2014-05-25 NOTE — Progress Notes (Addendum)
Physical Therapy Treatment Patient Details Name: Erin Sexton MRN: [id number] DOB: 28-Nov-1971 Today's Date: 05/25/2014    History of Present Illness Pt admit for CHF.      PT Comments    Pt admitted with above. Pt currently with functional limitations due to balance and endurance deficits.  Pt will benefit from skilled PT to increase their independence and safety with mobility to allow discharge to the venue listed below.   Follow Up Recommendations  CIR;Supervision - Intermittent     Equipment Recommendations  Rolling walker with 5" wheels;3in1 (PT)    Recommendations for Other Services Rehab consult     Precautions / Restrictions Precautions Precautions: Fall Restrictions Weight Bearing Restrictions: No    Mobility  Bed Mobility Overal bed mobility: Needs Assistance Bed Mobility: Supine to Sit     Supine to sit: Min assist        Transfers Overall transfer level: Needs assistance Equipment used: Rolling walker (2 wheeled) Transfers: Sit to/from Stand Sit to Stand: Min guard         General transfer comment: Needs cues and needs min steadying assist for standing.    Ambulation/Gait Ambulation/Gait assistance: Min guard Ambulation Distance (Feet): 50 Feet (15 feet, 15 feet and then 20 feet with sitting rest breaks) Assistive device: Rolling walker (2 wheeled) Gait Pattern/deviations: Step-through pattern;Decreased stride length;Wide base of support;Trunk flexed   Gait velocity interpretation: Below normal speed for age/gender General Gait Details: Pt ambulated fairly well with RW but had to take several sitting rest breaks due to dizziness after about 15 to 20 feet.  Hgb still low therefore unsure if that is causing dizziness/fatigue and short endurance.    Stairs            Wheelchair Mobility    Modified Rankin (Stroke Patients Only)       Balance Overall balance assessment: Needs assistance;History of Falls Sitting-balance support: No  upper extremity supported;Feet supported Sitting balance-Leahy Scale: Fair     Standing balance support: Bilateral upper extremity supported;During functional activity Standing balance-Leahy Scale: Poor Standing balance comment: Needs bil UE support for standing.                     Cognition Arousal/Alertness: Awake/alert Behavior During Therapy: WFL for tasks assessed/performed Overall Cognitive Status: Within Functional Limits for tasks assessed                      Exercises General Exercises - Upper Extremity Shoulder Flexion: AROM;Both;10 reps;Seated Shoulder Horizontal ABduction: AROM;Both;10 reps;Seated General Exercises - Lower Extremity Ankle Circles/Pumps: AROM;Both;10 reps;Seated Long Arc Quad: AROM;Both;10 reps;Seated    General Comments        Pertinent Vitals/Pain Orthostatic BPs  Supine 124/73, 80 bpm  Sitting 138/73, 82 bpm  Standing 148/88, 95 bpm  After 2nd walk with c/o dizzy 136/78, 118 bpm      Home Living                      Prior Function            PT Goals (current goals can now be found in the care plan section) Progress towards PT goals: Progressing toward goals    Frequency  Min 3X/week    PT Plan Current plan remains appropriate    Co-evaluation             End of Session Equipment Utilized During Treatment: Gait belt;Oxygen Activity Tolerance: Patient limited by fatigue  Patient left: in chair;with call bell/phone within reach     Time: 1008-1044 PT Time Calculation (min): 36 min  Charges:  $Gait Training: 8-22 mins $Therapeutic Exercise: 8-22 mins                    G Codes:      INGOLD,DAWN 17-Jun-2014, 2:04 PM [facility] [phone] (pager)

## 2014-05-25 NOTE — Progress Notes (Signed)
Physical Medicine and Rehabilitation Consult  Reason for Consult: Deconditioning/acute on chronic congestive heart failure/multi-medical  Referring Physician: Dr. Sallyanne Kuster  HPI: Erin Sexton is a 43 y.o. right-handed female with history of asthma, hypertension, diabetes mellitus with peripheral neuropathy, severe menorrhagia, morbid obesity, systolic congestive heart failure and hypertrophic cardiomyopathy. Patient recently admitted with profuse vaginal bleeding receive transfusion of 6 units of packed red blood cells and discharge to home 05/17/2014. Patient readmitted 05/18/2014 with increasing shortness of breath. Chest x-ray consistent with congestive heart failure. Congestive heart failure felt likely exacerbated by volume of recent transfusion. Maintained on Lasix therapy as directed and followup per cardiology services. Mildly elevated troponin felt to be secondary to congestive heart failure not a candidate for invasive workup and management conservatively. A urine culture 05/20/2014 Escherichia coli maintained on Cipro. Hemoglobin monitored closely with latest hemoglobin 8.4. Contact precautions for MRSA PCR screening positive. Physical therapy evaluation completed 05/24/2014 secondary to deconditioning with recommendations for physical medicine rehabilitation consult.  Patient states that prior to admission she is independent with out assistive device.  Review of Systems  Respiratory: Positive for shortness of breath.  Cardiovascular: Positive for palpitations.  Genitourinary:  Severe menorrhagia  Musculoskeletal: Positive for myalgias.  Psychiatric/Behavioral: Positive for depression.  Anxiety  All other systems reviewed and are negative.   Past Medical History   Diagnosis  Date   .  COPD (chronic obstructive pulmonary disease)    .  Depression    .  Hypertension    .  Asthma    .  Anxiety    .  Anemia    .  Blood transfusion without reported diagnosis    .  Diabetes mellitus  without complication      a. Dx 02/2014.   [name]  HOCM (hypertrophic obstructive cardiomyopathy)      a. Dx 66440.   .  Vaginal bleeding    .  SVT (supraventricular tachycardia)      a. Noted on tele during 02/2014 adm for symptomatic anemia.   [name]  NSVT (nonsustained ventricular tachycardia)      a. Noted on tele during 02/2014 adm for symptomatic anemia.   .  Morbid obesity    .  Abnormal CT scan      a. 02/2014: evaluated for dyspnea and initially placed on Xarelto for PE because radiologist could not exclude small peripheral pulmonary emboli because of limited contrast. However, after admission for ++vaginal bleeding several days later, it was felt she did NOT have PE - underwent further testing with neg VQ 03/06/14, neg LE duplex.    History reviewed. No pertinent past surgical history.  Family History   Problem  Relation  Age of Onset   .  CAD  Neg Hx      No family history of EARLY CAD    Social History: reports that she has never smoked. She has never used smokeless tobacco. She reports that she does not drink alcohol or use illicit drugs.  Allergies: No Known Allergies  Medications Prior to Admission   Medication  Sig  Dispense  Refill   .  albuterol (PROVENTIL HFA;VENTOLIN HFA) 108 (90 BASE) MCG/ACT inhaler  Inhale 2 puffs into the lungs every 6 (six) hours as needed for wheezing or shortness of breath.     [name]  albuterol (PROVENTIL) (2.5 MG/3ML) 0.083% nebulizer solution  Take 3 mLs (2.5 mg total) by nebulization every 6 (six) hours as needed for wheezing or shortness of breath.  75 mL  12   .  buPROPion (WELLBUTRIN XL) 150 MG 24 hr tablet  Take 1 tablet (150 mg total) by mouth daily.  30 tablet  0   .  ferrous sulfate 325 (65 FE) MG tablet  Take 1 tablet (325 mg total) by mouth 2 (two) times daily with a meal.  60 tablet  0   .  fluticasone (FLOVENT HFA) 110 MCG/ACT inhaler  Inhale 2 puffs into the lungs 2 (two) times daily.  1 Inhaler  12   .  levothyroxine (SYNTHROID, LEVOTHROID) 25 MCG  tablet  Take 25 mcg by mouth daily before breakfast.     .  megestrol (MEGACE) 40 MG tablet  Take 4 tablets (160 mg total) by mouth daily.  120 tablet  1   .  metFORMIN (GLUCOPHAGE) 500 MG tablet  Take 1 tablet (500 mg total) by mouth 2 (two) times daily with a meal.  60 tablet  0   .  pravastatin (PRAVACHOL) 20 MG tablet  Take 1 tablet (20 mg total) by mouth daily.  30 tablet  1    Home:  Home Living  Family/patient expects to be discharged to:: Private residence  Living Arrangements: Parent  Available Help at Discharge: Family;Available 24 hours/day (supervision only)  Type of Home: House  Home Access: Stairs to enter  [employer] of Steps: 4  Entrance Stairs-Rails: Right  Home Layout: One level  Home Equipment: None  Functional History:  Prior Function  Level of Independence: Independent  Functional Status:  Mobility:  Bed Mobility  Overal bed mobility: Needs Assistance  Bed Mobility: Supine to Sit  Supine to sit: Min assist  Transfers  Overall transfer level: Needs assistance  Transfers: Sit to/from Stand  Sit to Stand: Min assist  General transfer comment: Needs cues and needs min steadying assist for standing. Did not ambulate as pt dizzy and did appear to have orthostasis.    ADL:   Cognition:  Cognition  Overall Cognitive Status: Within Functional Limits for tasks assessed  Orientation Level: Oriented X4  Cognition  Arousal/Alertness: Awake/alert  Behavior During Therapy: WFL for tasks assessed/performed  Overall Cognitive Status: Within Functional Limits for tasks assessed  Blood pressure 125/73, pulse 69, temperature 98.1 F (36.7 C), temperature source Oral, resp. rate 18, height 5\' 9"  (1.753 m), weight 171.414 kg (377 lb 14.4 oz), SpO2 99.00%.  Physical Exam  Vitals reviewed.  Constitutional: She is oriented to person, place, and time.  43 year old morbidly obese female resting comfortably.  HENT:  Head: Normocephalic.  Eyes: EOM are normal.   Neck: Normal range of motion. Neck supple. No thyromegaly present.  Respiratory: Effort normal and breath sounds normal.  GI: Soft. Bowel sounds are normal. She exhibits no distension. There is no tenderness.  Neurological: She is alert and oriented to person, place, and time.  Skin: Skin is warm and dry.  Psychiatric: She has a normal mood and affect.  motor strength is 4/5 bilateral deltoids, 5/5 bilateral biceps triceps grip, 3 minus bilateral hip flexors limited by panniculus, 4/5 bilateral knee extensors ankle dorsiflexors  Sensory intact to light touch in both lower extremity  Results for orders placed during the hospital encounter of 05/18/14 (from the past 24 hour(s))   GLUCOSE, CAPILLARY Status: None    Collection Time    05/24/14 6:32 AM   Result  Value  Ref Range    Glucose-Capillary  90  70 - 99 mg/dL   GLUCOSE, CAPILLARY Status: None    Collection Time    05/24/14 11:30  AM   Result  Value  Ref Range    Glucose-Capillary  82  70 - 99 mg/dL   GLUCOSE, CAPILLARY Status: Abnormal    Collection Time    05/24/14 4:28 PM   Result  Value  Ref Range    Glucose-Capillary  101 (*)  70 - 99 mg/dL   GLUCOSE, CAPILLARY Status: Abnormal    Collection Time    05/24/14 9:26 PM   Result  Value  Ref Range    Glucose-Capillary  122 (*)  70 - 99 mg/dL    Comment 1  Notify RN     No results found.  Assessment/Plan:  Diagnosis: Deconditioning after acute exacerbation of chronic diastolic congestive heart failure  1. Does the need for close, 24 hr/day medical supervision in concert with the patient's rehab needs make it unreasonable for this patient to be served in a less intensive setting? Yes 2. Co-Morbidities requiring supervision/potential complications: Morbid obesity, hypertension, sleep apnea, diabetes mellitus type 2, vaginal bleeding 3. Due to bladder management, bowel management, safety, skin/wound care, disease management, medication administration and patient education, does  the patient require 24 hr/day rehab nursing? Yes 4. Does the patient require coordinated care of a physician, rehab nurse, PT (1-2 hrs/day, 5 days/week) and OT (1-2 hrs/day, 5 days/week) to address physical and functional deficits in the context of the above medical diagnosis(es)? Yes Addressing deficits in the following areas: balance, endurance, locomotion, strength, transferring, bowel/bladder control, bathing, dressing, feeding, grooming and toileting 5. Can the patient actively participate in an intensive therapy program of at least 3 hrs of therapy per day at least 5 days per week? Yes 6. The potential for patient to make measurable gains while on inpatient rehab is excellent 7. Anticipated functional outcomes upon discharge from inpatient rehab are modified independent with PT, modified independent with OT, n/a with SLP. 8. Estimated rehab length of stay to reach the above functional goals is: 7-10 days 9. Does the patient have adequate social supports to accommodate these discharge functional goals? Yes 10. Anticipated D/C setting: Home 11. Anticipated post D/C treatments: [place] therapy 12. Overall Rehab/Functional Prognosis: excellent RECOMMENDATIONS:  This patient's condition is appropriate for continued rehabilitative care in the following setting: CIR  Patient has agreed to participate in recommended program. Yes  Note that insurance prior authorization may be required for reimbursement for recommended care.  Comment:  05/25/2014  Revision History.Marland KitchenMarland Kitchen

## 2014-05-25 NOTE — PMR Pre-admission (Signed)
PMR Admission Coordinator Pre-Admission Assessment  Patient: Erin Sexton is an 43 y.o., female MRN: [id number] DOB: [birth year] Height: 5\' 9"  (175.3 cm) Weight: 171.414 kg (377 lb 14.4 oz)              Insurance Information HMO:    PPO:      PCP:      IPA:      80/20:      OTHER:   PRIMARY:  Medicare A and B      Policy#:  [id number]      Subscriber:  self CM Name:        Phone#:       Fax#:   Pre-Cert#:        Employer:  Not employed Benefits:  Phone #:       Name:   Eff. Date:  08/09/2008     Deduct:  $1260      Out of Pocket Max:  no      Life Max:  no CIR:  100%      SNF:  100% first 20 days per Medicare guidelines Outpatient:  80%     Co-Pay:  20% Home Health:  100% per Medicare guidelines      Co-Pay:   DME:  80%     Co-Pay:  20% Providers:  Pt. choice SECONDARY:        Policy#:       Subscriber:  CM Name:       Phone#:      Fax#:  Pre-Cert#:       Employer:  Benefits:  Phone #:      Name:  Eff. Date:      Deduct:       Out of Pocket Max:       Life Max:  CIR:       SNF:  Outpatient:      Co-Pay:  Home Health:       Co-Pay:  DME:      Co-Pay:   Medicaid Application Date:      Case Manager:  Disability Application Date:       Case Worker:   Emergency Contact Information Contact Information   Name Relation Home Work Mobile   Davis,Jeanette Mother [phone]     Beryle Quant [phone]       Current Medical History  Patient Admitting Diagnosis: Deconditioning after acute exacerbation of chronic diastolic congestive heart failure  History of Present Illness: Erin Sexton is a 43 y.o. right-handed female with history of asthma, hypertension, diabetes mellitus with peripheral neuropathy, severe menorrhagia, morbid obesity, systolic congestive heart failure and hypertrophic cardiomyopathy. Independent prior to admission. Patient recently admitted with profuse vaginal bleeding receive transfusion of 6 units of packed red blood cells and discharge to home  05/17/2014. Patient readmitted 05/18/2014 with increasing shortness of breath. Chest x-ray consistent with congestive heart failure. Congestive heart failure felt likely exacerbated by volume of recent transfusion. Maintained on Lasix therapy as directed and followup per cardiology services. Mildly elevated troponin felt to be secondary to congestive heart failure not a candidate for invasive workup and management conservatively. A urine culture 05/20/2014 Escherichia coli maintained on Cipro. Hemoglobin monitored closely with latest hemoglobin 8.4. Contact precautions for MRSA PCR screening positive. Physical therapy evaluation completed 05/24/2014 secondary to deconditioning with recommendations for physical medicine rehabilitation consult. Admit for comprehensive rehabilitation program      Past Medical History  Past Medical History  Diagnosis Date  . COPD (chronic obstructive pulmonary disease)   .  Depression   . Hypertension   . Asthma   . Anxiety   . Anemia   . Blood transfusion without reported diagnosis   . Diabetes mellitus without complication     a. Dx 02/2014.  [name] HOCM (hypertrophic obstructive cardiomyopathy)     a. Dx 10272.  . Vaginal bleeding   . SVT (supraventricular tachycardia)     a. Noted on tele during 02/2014 adm for symptomatic anemia.  [name] NSVT (nonsustained ventricular tachycardia)     a. Noted on tele during 02/2014 adm for symptomatic anemia.  . Morbid obesity   . Abnormal CT scan     a. 02/2014: evaluated for dyspnea and initially placed on Xarelto for PE because radiologist could not exclude small peripheral pulmonary emboli because of limited contrast. However, after admission for ++vaginal bleeding several days later, it was felt she did NOT have PE - underwent further testing with neg VQ 03/06/14, neg LE duplex.    Family History  family history is negative for CAD.  Prior Rehab/Hospitalizations: no prior rehab; several recent hospitalizations since March  2015   Current Medications  Current facility-administered medications:acetaminophen (TYLENOL) tablet 650 mg, 650 mg, Oral, Q6H PRN, Pixie Casino, MD, 650 mg at 05/23/14 1415;  albuterol (PROVENTIL) (2.5 MG/3ML) 0.083% nebulizer solution 2.5 mg, 2.5 mg, Nebulization, Q6H PRN, Jacolyn Reedy, MD, 2.5 mg at 05/20/14 2048;  alum & mag hydroxide-simeth (MAALOX/MYLANTA) 200-200-20 MG/5ML suspension 30 mL, 30 mL, Oral, Q4H PRN, Rogelia Mire, NP, 30 mL at 05/21/14 1251 atorvastatin (LIPITOR) tablet 10 mg, 10 mg, Oral, q1800, Mihai Croitoru, MD, 10 mg at 05/24/14 1737;  buPROPion (WELLBUTRIN XL) 24 hr tablet 150 mg, 150 mg, Oral, Daily, Jacolyn Reedy, MD, 150 mg at 05/25/14 0930;  ciprofloxacin (CIPRO) tablet 500 mg, 500 mg, Oral, BID, Jessica U Vann, DO, 500 mg at 05/25/14 0900;  ferrous sulfate tablet 325 mg, 325 mg, Oral, BID WC, Jacolyn Reedy, MD, 325 mg at 05/25/14 1230 fluticasone (FLOVENT HFA) 110 MCG/ACT inhaler 2 puff, 2 puff, Inhalation, BID, Jacolyn Reedy, MD, 2 puff at 05/25/14 0734;  furosemide (LASIX) tablet 40 mg, 40 mg, Oral, BID, Satira Sark, MD, 40 mg at 05/25/14 0900;  insulin aspart (novoLOG) injection 0-20 Units, 0-20 Units, Subcutaneous, TID WC, Jacolyn Reedy, MD, 3 Units at 05/25/14 1231 levothyroxine (SYNTHROID, LEVOTHROID) tablet 25 mcg, 25 mcg, Oral, QAC breakfast, Jacolyn Reedy, MD, 25 mcg at 05/25/14 [phone];  megestrol (MEGACE) tablet 160 mg, 160 mg, Oral, Daily, Jacolyn Reedy, MD, 160 mg at 05/25/14 4403;  metFORMIN (GLUCOPHAGE) tablet 500 mg, 500 mg, Oral, BID WC, Jacolyn Reedy, MD, 500 mg at 05/25/14 0800;  metoprolol tartrate (LOPRESSOR) tablet 25 mg, 25 mg, Oral, BID, Belva Crome III, MD, 25 mg at 05/25/14 4742 morphine 2 MG/ML injection 2 mg, 2 mg, Intravenous, Q4H PRN, Rogelia Mire, NP, 2 mg at 05/23/14 1945;  ondansetron (ZOFRAN) injection 4 mg, 4 mg, Intravenous, Q8H PRN, Liliane Shi, PA-C, 4 mg at 05/21/14 2020;  sodium chloride  0.9 % injection 3 mL, 3 mL, Intravenous, Q12H, Mihai Croitoru, MD, 3 mL at 05/25/14 1100;  sodium chloride 0.9 % injection 3 mL, 3 mL, Intravenous, PRN, Mihai Croitoru, MD verapamil (CALAN-SR) CR tablet 180 mg, 180 mg, Oral, Daily, Jeronimo Norma, RPH, 180 mg at 05/25/14 5956  Patients Current Diet: Carb modified, reduced sodium  Precautions / Restrictions Precautions Precautions: Fall Restrictions Weight Bearing Restrictions: No   Prior Activity  Level Community (5-7x/wk): Prior to March 2015, pt was independent , driving , shopping, and visiting friends and neighbors. Home Assistive Devices / Equipment Home Assistive Devices/Equipment: Nebulizer Home Equipment: None  Prior Functional Level Prior Function Level of Independence: Independent  Current Functional Level Cognition  Overall Cognitive Status: Within Functional Limits for tasks assessed Orientation Level: Oriented X4    Extremity Assessment (includes Sensation/Coordination)          ADLs       Mobility  Overal bed mobility: Needs Assistance Bed Mobility: Supine to Sit Supine to sit: Min assist    Transfers  Overall transfer level: Needs assistance Equipment used: Rolling walker (2 wheeled) Transfers: Sit to/from Stand Sit to Stand: Min guard General transfer comment: Needs cues and needs min steadying assist for standing.      Ambulation / Gait / Stairs / Wheelchair Mobility  Ambulation/Gait Ambulation/Gait assistance: Min guard Ambulation Distance (Feet): 50 Feet (15 feet, 15 feet and then 20 feet with sitting rest breaks) Assistive device: Rolling walker (2 wheeled) Gait Pattern/deviations: Step-through pattern;Decreased stride length;Wide base of support;Trunk flexed Gait velocity interpretation: Below normal speed for age/gender General Gait Details: Pt ambulated fairly well with RW but had to take several sitting rest breaks due to dizziness after about 15 to 20 feet.  Hgb still low therefore unsure if  that is causing dizziness/fatigue and short endurance.     Posture / Balance      Special needs/care consideration BiPAP/CPAP  No; pt. Reports she does have sleep apnea but is claustrophobic and could not tolerate the face mask.  She is scheduled for follow up sleep study in July. CPM  no Continuous Drip IV  no Dialysis  no        Life Vest  no Oxygen  no Special Bed  Yes, "Mighty Air" bariatric bed Trach Size  no Wound Vac (area) no       Skin  Pt.  denies issues                              Bowel mgmt:  Taking colace, last BM 05/23/14 Bladder mgmt:  continent Diabetic mgmt  Yes, see meds list     Previous Home Environment Living Arrangements: Parent  Lives With: Other (Comment) (pt. and her mom live together) Available Help at Discharge: Family;Available 24 hours/day (supervision only) Type of Home: House Home Layout: One level Home Access: Stairs to enter Entrance Stairs-Rails: Right Entrance Stairs-Number of Steps: 4 Bathroom Shower/Tub: Multimedia programmer: Standard Bathroom Accessibility: Yes How Accessible: Accessible via walker [place]: No  Discharge Living Setting Plans for Discharge Living Setting: Patient's home Type of Home at Discharge: House Discharge Home Layout: One level Discharge Home Access: Stairs to enter Entrance Stairs-Rails: Right Entrance Stairs-Number of Steps: 4 Discharge Bathroom Shower/Tub: Walk-in shower Discharge Bathroom Toilet: Standard Discharge [place] Accessibility: Yes How Accessible: Accessible via walker Does the patient have any problems obtaining your medications?: No  Social/Family/Support Systems Patient Roles: Other (Comment) (daugter, sister and aunt) Anticipated Caregiver: Joanell Rising, mother Anticipated Caregiver's Contact Information: [phone] Caregiver Availability: 24/7 Discharge Plan Discussed with Primary Caregiver: Yes Is Caregiver In Agreement with Plan?: Yes Does Caregiver/Family  have Issues with Lodging/Transportation while Pt is in Rehab?: No    Goals/Additional Needs Patient/Family Goal for Rehab: modified independent PT and OT Expected length of stay: 7 to 10 days Cultural Considerations: none Dietary Needs: carb modified, Na  restrictions Equipment Needs: TBD Pt/Family Agrees to Admission and willing to participate: Yes Program Orientation Provided & Reviewed with Pt/Caregiver Including Roles  & Responsibilities: Yes   Decrease burden of Care through IP rehab admission:  no  Possible need for SNF placement upon discharge:  Not anticipated   Patient Condition: This patient's condition remains as documented in the consult dated 05/25/14 , in which the Rehabilitation Physician determined and documented that the patient's condition is appropriate for intensive rehabilitative care in an inpatient rehabilitation facility. Will admit to inpatient rehab today.  Preadmission Screen Completed By:  Gerlean Ren PT, 05/25/2014 2:26 PM ______________________________________________________________________   Discussed status with Dr.  Naaman Plummer on 05/25/14 at  1424  and received telephone approval for admission today.  Admission Coordinator:  Gerlean Ren, PT time [address] 05/25/14

## 2014-05-25 NOTE — Progress Notes (Signed)
Inpatient Rehabilitation  I met with Ms. Erin Sexton at  he bedside to discuss her post acute rehab options.  I answered her questions and provided informational booklets. She indicates that she wants to "get back to normal" and has asked me to arrange for her admission to IP rehab.  I have discussed with pt's RN Marge and with Dr. Radford Pax who is agreeable for admittance to rehab.  I will arrange.  Please call if questions.  [place] Admissions Coordinator Cell [phone] Office [phone]

## 2014-05-25 NOTE — Progress Notes (Signed)
SUBJECTIVE:  No complaints  OBJECTIVE:   Vitals:   Filed Vitals:   05/24/14 1356 05/24/14 2048 05/25/14 0521 05/25/14 0735  BP: 105/66 125/69 125/73   Pulse: 73 72 69 77  Temp: 97.9 F (36.6 C) 97.9 F (36.6 C) 98.1 F (36.7 C)   TempSrc: Oral Oral Oral   Resp: 20 18 18 18   Height:      Weight:   377 lb 14.4 oz (171.414 kg)   SpO2: 98% 99% 99% 98%   I&O's:   Intake/Output Summary (Last 24 hours) at 05/25/14 0754 Last data filed at 05/24/14 1915  Gross per 24 hour  Intake    840 ml  Output   1000 ml  Net   -160 ml   TELEMETRY: Reviewed telemetry pt in NSR:     PHYSICAL EXAM General: Well developed, well nourished, in no acute distress Head: Eyes PERRLA, No xanthomas.   Normal cephalic and atramatic  Lungs:   Clear bilaterally to auscultation and percussion. Heart:   HRRR S1 S2 Pulses are 2+ & equal. Abdomen: Bowel sounds are positive, abdomen soft and non-tender without masses  Extremities:   No clubbing, cyanosis or edema.  DP +1 Neuro: Alert and oriented X 3. Psych:  Good affect, responds appropriately   LABS: Basic Metabolic Panel:  Recent Labs  05/23/14 0348 05/24/14 0502  NA 142 141  K 3.8 3.8  CL 100 99  CO2 28 26  GLUCOSE 106* 86  BUN 14 14  CREATININE 1.05 1.07  CALCIUM 8.6 8.9   Liver Function Tests: No results found for this basename: AST, ALT, ALKPHOS, BILITOT, PROT, ALBUMIN,  in the last 72 hours No results found for this basename: LIPASE, AMYLASE,  in the last 72 hours CBC:  Recent Labs  05/24/14 0502 05/25/14 0646  WBC 9.4 8.5  HGB 8.4* 8.9*  HCT 26.8* 28.6*  MCV 86.7 86.9  PLT 325 339   Cardiac Enzymes: No results found for this basename: CKTOTAL, CKMB, CKMBINDEX, TROPONINI,  in the last 72 hours BNP: No components found with this basename: POCBNP,  D-Dimer: No results found for this basename: DDIMER,  in the last 72 hours Hemoglobin A1C: No results found for this basename: HGBA1C,  in the last 72 hours Fasting Lipid  Panel: No results found for this basename: CHOL, HDL, LDLCALC, TRIG, CHOLHDL, LDLDIRECT,  in the last 72 hours Thyroid Function Tests: No results found for this basename: TSH, T4TOTAL, FREET3, T3FREE, THYROIDAB,  in the last 72 hours Anemia Panel: No results found for this basename: VITAMINB12, FOLATE, FERRITIN, TIBC, IRON, RETICCTPCT,  in the last 72 hours Coag Panel:   Lab Results  Component Value Date   INR 1.66* 03/04/2014   INR 1.11 03/02/2014    RADIOLOGY: US Transvaginal Non-ob  05/15/2014   CLINICAL DATA:  Vaginal bleeding since March 2015.  EXAM: TRANSABDOMINAL AND TRANSVAGINAL ULTRASOUND OF PELVIS  TECHNIQUE: Both transabdominal and transvaginal ultrasound examinations of the pelvis were performed. Transabdominal technique was performed for global imaging of the pelvis including uterus, ovaries, adnexal regions, and pelvic cul-de-sac. It was necessary to proceed with endovaginal exam following the transabdominal exam to visualize the endometrium and ovaries.  COMPARISON:  03/06/2014  FINDINGS: Examination is somewhat limited due to patient body habitus.  Uterus  Measurements: 6.9 x 7.8 x 12.0 cm. No fibroids or other mass visualized.  Endometrium  Thickness: 27 mm (previously measured 15 mm). Thickened a moderately heterogeneous likely due in part to hemorrhagic debris in this  patient with moderate vaginal bleeding. Could not exclude an endometrial mass/polyp or hyperplasia.  Right ovary  Not visualized.  Left ovary  Not visualized.  Other findings  There is a large heterogeneous somewhat hyperechoic mass centered over the cervix measuring 6 x 6 x 6.9 cm.  IMPRESSION: Slightly enlarged uterus with moderately thickened heterogeneous endometrium measuring 27 mm which has increased in thickness compared to the prior exam. This is likely due in part to hemorrhagic debris in this patient with moderate vaginal bleeding, although cannot exclude a mass. New large heterogeneous echogenic masslike area  centered over the cervix measuring 6 x 6 x 6.9 cm likely a large region of clot. Recommend GYN consultation.  If bleeding remains unresponsive to hormonal or medical therapy, focal lesion work-up with sonohysterogram should be considered. Endometrial biopsy should also be considered in pre-menopausal patients at high risk for endometrial carcinoma. (Ref: Radiological Reasoning: Algorithmic Workup of Abnormal Vaginal Bleeding with Endovaginal Sonography and Sonohysterography. AJR 2008; 631:S97-02)  Nonvisualization of the ovaries.   Electronically Signed   By: Marin Olp M.D.   On: 05/15/2014 07:19   US Pelvis Complete  05/15/2014   CLINICAL DATA:  Vaginal bleeding since March 2015.  EXAM: TRANSABDOMINAL AND TRANSVAGINAL ULTRASOUND OF PELVIS  TECHNIQUE: Both transabdominal and transvaginal ultrasound examinations of the pelvis were performed. Transabdominal technique was performed for global imaging of the pelvis including uterus, ovaries, adnexal regions, and pelvic cul-de-sac. It was necessary to proceed with endovaginal exam following the transabdominal exam to visualize the endometrium and ovaries.  COMPARISON:  03/06/2014  FINDINGS: Examination is somewhat limited due to patient body habitus.  Uterus  Measurements: 6.9 x 7.8 x 12.0 cm. No fibroids or other mass visualized.  Endometrium  Thickness: 27 mm (previously measured 15 mm). Thickened a moderately heterogeneous likely due in part to hemorrhagic debris in this patient with moderate vaginal bleeding. Could not exclude an endometrial mass/polyp or hyperplasia.  Right ovary  Not visualized.  Left ovary  Not visualized.  Other findings  There is a large heterogeneous somewhat hyperechoic mass centered over the cervix measuring 6 x 6 x 6.9 cm.  IMPRESSION: Slightly enlarged uterus with moderately thickened heterogeneous endometrium measuring 27 mm which has increased in thickness compared to the prior exam. This is likely due in part to hemorrhagic debris  in this patient with moderate vaginal bleeding, although cannot exclude a mass. New large heterogeneous echogenic masslike area centered over the cervix measuring 6 x 6 x 6.9 cm likely a large region of clot. Recommend GYN consultation.  If bleeding remains unresponsive to hormonal or medical therapy, focal lesion work-up with sonohysterogram should be considered. Endometrial biopsy should also be considered in pre-menopausal patients at high risk for endometrial carcinoma. (Ref: Radiological Reasoning: Algorithmic Workup of Abnormal Vaginal Bleeding with Endovaginal Sonography and Sonohysterography. AJR 2008; 637:C58-85)  Nonvisualization of the ovaries.   Electronically Signed   By: Marin Olp M.D.   On: 05/15/2014 07:19   Dg Chest Port 1 View  05/18/2014   CLINICAL DATA:  Chest pain and shortness of breath  EXAM: PORTABLE CHEST - 1 VIEW  COMPARISON:  03/05/2014  FINDINGS: Cardiac shadow is mildly enlarged. Diffuse vascular congestion and parenchymal opacities are noted consistent with congestive failure. No pneumothorax or sizable effusion is seen.  IMPRESSION: Changes consistent with congestive failure.   Electronically Signed   By: Inez Catalina M.D.   On: 05/18/2014 14:33   ASSESSMENT AND PLAN  This 43 year-old female with a  hx of HOCM, COPD, DM, morbid obesity, SVT, NSVT and severe menorrhagia recently. She was admitted in 02/2014 with dyspnea and chest pain. CT of the chest was felt to demonstrate possible small bilateral pulmonary emboli. She was started on Xarelto with plans to anticoagulate for 3-6 mos. She was then readmitted to 3/28-3/31 with vaginal bleeding and profound anemia. She required transfusion with 4 units PRBCs. She was seen by pulmonology due to recent history of possible PE. Chest CT was reviewed and did not demonstrate evidence of pulmonary embolism. VQ scan was negative. Therefore, it was felt that she did not have a pulmonary embolism and anticoagulation was discontinued. She was  admitted again just a few days ago with to recurrent profuse vaginal bleeding and received transfusion of 6 units and was sent home 05/17/14 but remained short of breath and returned to [facility] later that night with worsening dyspnea. She was found to have acute pulmonary edema and treated with BiPAP and IV Lasix. She has a very mild elevation of troponin and was transferred to [facility] for further management.  Acute on chronic diastolic CHF- Likely exacerbated by volume of recent transfusion with 6 units in the setting of HOCM. Breathing much improved.  -- Neg 9.9L; 400 lbs--> 377 lbs today  -- On Lasix 40mg  po bid.  Anemia- Improving. Last hg 8.9.  -- Ferritin 9. Iron 15.  -- Secondary to vaginal bleeding. Seen by Dr. Elly Modena with OB/GYN on 05/14/14 and placed on Premarin and Megace and iron.  -- No active bleeding.  Elevated troponin - explained in the setting of CHF (0.32)  --She has severe morbid obesity and was not felt to be a good candidate for noninvasive or invasive testing and has been managed conservatively.  -- Continue BB and statin  UTI- ecoli. Foley discontinued. On Ceftriaxone and Pyridium per IM.  HOCM  -- LVEF 65-70% with grade 2 diastolic dysfunction.  -- Continue Verapamil SR to 180 mg daily  DM- Hg A1c 5.7  -- Continue metformin  HLD- continue statin  Weakness and Deconditioning - she got very weak and dizzy when up ambulating.  PT recommended inpatient Rehab consult to see if she is a candidate for inpatient rehab for a few days.  ? NSVT - I have reviewed the tele personally and the only abnormality was around 4:30pm where nurse read out [facility] but this is clearly artifact. QRS complexes can be seen marching through it.   [facility] recs   Sueanne Margarita, MD  05/25/2014  7:54 AM

## 2014-05-25 NOTE — Discharge Summary (Signed)
Discharge Summary   Patient ID: Erin Sexton MRN: [id number], DOB/AGE: 10-Jul-1971 43 y.o. Admit date: 05/18/2014 D/C date:     05/25/2014  Primary Cardiologist: Dr. Bronson Ing   Principal Problem:   Acute on chronic diastolic congestive heart failure Active Problems:   Morbid obesity   Essential hypertension, benign   Asthma, chronic   Vaginal bleeding   Acute blood loss anemia   HOCM (hypertrophic obstructive cardiomyopathy)   OSA (obstructive sleep apnea)   Diabetes mellitus type 2, noninsulin dependent   Catheter-associated urinary tract infection    Discharge Diagnosis: Acute on chronic diastolic CHF- likely due to recent transfusion with 6 units in the setting of HOCM.   HPI: This 43 year-old female with a hx of HOCM, COPD, DM, morbid obesity, SVT, NSVT and severe menorrhagia recently who was admitted to [facility] on 05/18/2014 with SOB.   She has an extremely complicated history and recent hospital course.  She was admitted 03/02/14-03/03/14 with dyspnea and chest pain. CT of the chest was felt to demonstrate possible small bilateral pulmonary emboli. She was started on Xarelto with plans to anticoagulate for 3-6 months.  She was then readmitted to 3/28-3/31 with vaginal bleeding and profound anemia. She required transfusion with 4 units PRBCs. She was seen by pulmonology due to recent history of possible PE. Chest CT was reviewed and did not demonstrate evidence of pulmonary embolism. VQ scan was negative. Therefore, it was felt that she did not have a pulmonary embolism and anticoagulation was discontinued. During that admission she was seen by cardiology for chest pain and SOB. ECHO incidentally showed HOCM physiology. She also had brief SVT and NSVT during that admission. Her CP and SOB were felt to be due to her significant anemia and improved after transfusion.  She was admitted again 6/6-6/9 with to recurrent profuse vaginal bleeding and received transfusion of 6 units and was  sent home 6/9 but remained short of breath and returned to [facility] later that night with worsening dyspnea. She was found to have acute pulmonary edema and treated with BiPAP and IV Lasix. She has a very mild elevation of troponin and was transferred to [facility] for further management.    Acute on chronic diastolic CHF- Likely exacerbated by volume of recent transfusion with 6 units in the setting of HOCM. Breathing much improved.  -- Neg 9.9L; 400 lbs--> 377 lbs today  -- On Lasix 40mg  po bid.   Anemia- Improving. Last hg 8.9.  -- Ferritin 9. Iron 15.  -- Secondary to vaginal bleeding. Seen by Dr. Elly Modena with OB/GYN on 05/14/14 and placed on Premarin and Megace and iron.  -- No active bleeding.   Elevated troponin - explained in the setting of CHF (0.32)  --She has severe morbid obesity and was not felt to be a good candidate for noninvasive or invasive testing and has been managed conservatively.  -- Continue BB and statin   UTI- ecoli. Foley discontinued. Given Ceftriaxone and Pyridium per IM. Now on cipro 500mg  BID. She has received 5 doses of Cipro. Will need to complete a 2 week course.   HOCM  -- LVEF 65-70% with grade 2 diastolic dysfunction.  -- Continue Verapamil SR to 180 mg daily   DM- Hg A1c 5.7  -- Continue metformin   HLD- continue statin   Weakness and Deconditioning - she got very weak and dizzy when up ambulating. PT recommended inpatient Rehab consult to see if she is a candidate for inpatient rehab for a  few days. She will be discharged to this floor.   Worry of NSVT - reviewed by MD and deemed artifact. QRS complexes can be seen marching through it.   The patient has had an complicated hospital course but is recovering well. She has been seen by Dr. Heron Nay today and deemed ready for discharge to inpatient rehab. All follow-up appointments have been scheduled. Discharge medications are listed below.    Discharge Vitals: Blood pressure 122/76, pulse 77, temperature 98 F  (36.7 C), temperature source Oral, resp. rate 18, height 5\' 9"  (1.753 m), weight 377 lb 14.4 oz (171.414 kg), SpO2 98.00%.  Labs: Lab Results  Component Value Date   WBC 8.5 05/25/2014   HGB 8.9* 05/25/2014   HCT 28.6* 05/25/2014   MCV 86.9 05/25/2014   PLT 339 05/25/2014     Recent Labs Lab 05/25/14 0646  NA 143  K 3.9  CL 103  CO2 28  BUN 12  CREATININE 0.93  CALCIUM 8.9  GLUCOSE 99    Lab Results  Component Value Date   CHOL 124 05/15/2014   HDL 25* 05/15/2014   LDLCALC 77 05/15/2014   TRIG 111 05/15/2014    Diagnostic Studies/Procedures   US Transvaginal Non-ob  05/15/2014   CLINICAL DATA:  Vaginal bleeding since March 2015.  EXAM: TRANSABDOMINAL AND TRANSVAGINAL ULTRASOUND OF PELVIS  TECHNIQUE: Both transabdominal and transvaginal ultrasound examinations of the pelvis were performed. Transabdominal technique was performed for global imaging of the pelvis including uterus, ovaries, adnexal regions, and pelvic cul-de-sac. It was necessary to proceed with endovaginal exam following the transabdominal exam to visualize the endometrium and ovaries.  COMPARISON:  03/06/2014  FINDINGS: Examination is somewhat limited due to patient body habitus.  Uterus  Measurements: 6.9 x 7.8 x 12.0 cm. No fibroids or other mass visualized.  Endometrium  Thickness: 27 mm (previously measured 15 mm). Thickened a moderately heterogeneous likely due in part to hemorrhagic debris in this patient with moderate vaginal bleeding. Could not exclude an endometrial mass/polyp or hyperplasia.  Right ovary  Not visualized.  Left ovary  Not visualized.  Other findings  There is a large heterogeneous somewhat hyperechoic mass centered over the cervix measuring 6 x 6 x 6.9 cm.  IMPRESSION: Slightly enlarged uterus with moderately thickened heterogeneous endometrium measuring 27 mm which has increased in thickness compared to the prior exam. This is likely due in part to hemorrhagic debris in this patient with moderate  vaginal bleeding, although cannot exclude a mass. New large heterogeneous echogenic masslike area centered over the cervix measuring 6 x 6 x 6.9 cm likely a large region of clot. Recommend GYN consultation.  If bleeding remains unresponsive to hormonal or medical therapy, focal lesion work-up with sonohysterogram should be considered. Endometrial biopsy should also be considered in pre-menopausal patients at high risk for endometrial carcinoma. (Ref: Radiological Reasoning: Algorithmic Workup of Abnormal Vaginal Bleeding with Endovaginal Sonography and Sonohysterography. AJR 2008; 188:C16-60)  Nonvisualization of the ovaries.   Electronically Signed   By: Marin Olp M.D.   On: 05/15/2014 07:19   US Pelvis Complete  05/15/2014   CLINICAL DATA:  Vaginal bleeding since March 2015.  EXAM: TRANSABDOMINAL AND TRANSVAGINAL ULTRASOUND OF PELVIS  TECHNIQUE: Both transabdominal and transvaginal ultrasound examinations of the pelvis were performed. Transabdominal technique was performed for global imaging of the pelvis including uterus, ovaries, adnexal regions, and pelvic cul-de-sac. It was necessary to proceed with endovaginal exam following the transabdominal exam to visualize the endometrium and ovaries.  COMPARISON:  03/06/2014  FINDINGS: Examination is somewhat limited due to patient body habitus.  Uterus  Measurements: 6.9 x 7.8 x 12.0 cm. No fibroids or other mass visualized.  Endometrium  Thickness: 27 mm (previously measured 15 mm). Thickened a moderately heterogeneous likely due in part to hemorrhagic debris in this patient with moderate vaginal bleeding. Could not exclude an endometrial mass/polyp or hyperplasia.  Right ovary  Not visualized.  Left ovary  Not visualized.  Other findings  There is a large heterogeneous somewhat hyperechoic mass centered over the cervix measuring 6 x 6 x 6.9 cm.  IMPRESSION: Slightly enlarged uterus with moderately thickened heterogeneous endometrium measuring 27 mm which has  increased in thickness compared to the prior exam. This is likely due in part to hemorrhagic debris in this patient with moderate vaginal bleeding, although cannot exclude a mass. New large heterogeneous echogenic masslike area centered over the cervix measuring 6 x 6 x 6.9 cm likely a large region of clot. Recommend GYN consultation.  If bleeding remains unresponsive to hormonal or medical therapy, focal lesion work-up with sonohysterogram should be considered. Endometrial biopsy should also be considered in pre-menopausal patients at high risk for endometrial carcinoma. (Ref: Radiological Reasoning: Algorithmic Workup of Abnormal Vaginal Bleeding with Endovaginal Sonography and Sonohysterography. AJR 2008; 767:H41-93)  Nonvisualization of the ovaries.   Electronically Signed   By: Marin Olp M.D.   On: 05/15/2014 07:19   Dg Chest Port 1 View  05/18/2014   CLINICAL DATA:  Chest pain and shortness of breath  EXAM: PORTABLE CHEST - 1 VIEW  COMPARISON:  03/05/2014  FINDINGS: Cardiac shadow is mildly enlarged. Diffuse vascular congestion and parenchymal opacities are noted consistent with congestive failure. No pneumothorax or sizable effusion is seen.  IMPRESSION: Changes consistent with congestive failure.      Discharge Medications     Medication List         albuterol 108 (90 BASE) MCG/ACT inhaler  Commonly known as:  PROVENTIL HFA;VENTOLIN HFA  Inhale 2 puffs into the lungs every 6 (Latrish Mogel) hours as needed for wheezing or shortness of breath.     albuterol (2.5 MG/3ML) 0.083% nebulizer solution  Commonly known as:  PROVENTIL  Take 3 mLs (2.5 mg total) by nebulization every 6 (Delancey Moraes) hours as needed for wheezing or shortness of breath.     buPROPion 150 MG 24 hr tablet  Commonly known as:  WELLBUTRIN XL  Take 1 tablet (150 mg total) by mouth daily.     ciprofloxacin 500 MG tablet  Commonly known as:  CIPRO  Take 1 tablet (500 mg total) by mouth 2 (two) times daily.     ferrous sulfate 325  (65 FE) MG tablet  Take 1 tablet (325 mg total) by mouth 2 (two) times daily with a meal.     fluticasone 110 MCG/ACT inhaler  Commonly known as:  FLOVENT HFA  Inhale 2 puffs into the lungs 2 (two) times daily.     furosemide 40 MG tablet  Commonly known as:  LASIX  Take 1 tablet (40 mg total) by mouth 2 (two) times daily.     levothyroxine 25 MCG tablet  Commonly known as:  SYNTHROID, LEVOTHROID  Take 25 mcg by mouth daily before breakfast.     megestrol 40 MG tablet  Commonly known as:  MEGACE  Take 4 tablets (160 mg total) by mouth daily.     metFORMIN 500 MG tablet  Commonly known as:  GLUCOPHAGE  Take 1 tablet (500 mg total)  by mouth 2 (two) times daily with a meal.     metoprolol tartrate 25 MG tablet  Commonly known as:  LOPRESSOR  Take 1 tablet (25 mg total) by mouth 2 (two) times daily.     pravastatin 20 MG tablet  Commonly known as:  PRAVACHOL  Take 1 tablet (20 mg total) by mouth daily.     verapamil 180 MG CR tablet  Commonly known as:  CALAN-SR  Take 1 tablet (180 mg total) by mouth daily.        Disposition   The patient will be discharged in stable condition to home.  Follow-up Information   Follow up with Jory Sims, NP On 06/06/2014. (@ 2:10pm)    Specialty:  Nurse Practitioner   Contact information:   [address] [place] [address] [phone]         Duration of Discharge Encounter: Greater than 30 minutes including physician and PA time.  SignedVertell Limber, KATHRYN PA-C 05/25/2014, 3:05 PM

## 2014-05-25 NOTE — Interval H&P Note (Signed)
Erin Sexton was admitted today to Inpatient Rehabilitation with the diagnosis of deconditioning after CHF.  The patient's history has been reviewed, patient examined, and there is no change in status.  Patient continues to be appropriate for intensive inpatient rehabilitation.  I have reviewed the patient's chart and labs.  Questions were answered to the patient's satisfaction.  [name] T 05/25/2014, 7:31 PM

## 2014-05-25 NOTE — Discharge Instructions (Signed)
Heart Failure  Heart failure is a condition in which the heart has trouble pumping blood. This means your heart does not pump blood efficiently for your body to work well. In some cases of heart failure, fluid may back up into your lungs or you may have swelling (edema) in your lower legs. Heart failure is usually a long-term (chronic) condition. It is important for you to take good care of yourself and follow your caregiver's treatment plan.  CAUSES   Some health conditions can cause heart failure. Those health conditions include:  · High blood pressure (hypertension) causes the heart muscle to work harder than normal. When pressure in the blood vessels is high, the heart needs to pump (contract) with more force in order to circulate blood throughout the body. High blood pressure eventually causes the heart to become stiff and weak.  · Coronary artery disease (CAD) is the buildup of cholesterol and fat (plaque) in the arteries of the heart. The blockage in the arteries deprives the heart muscle of oxygen and blood. This can cause chest pain and may lead to a heart attack. High blood pressure can also contribute to CAD.  · Heart attack (myocardial infarction) occurs when 1 or more arteries in the heart become blocked. The loss of oxygen damages the muscle tissue of the heart. When this happens, part of the heart muscle dies. The injured tissue does not contract as well and weakens the heart's ability to pump blood.  · Abnormal heart valves can cause heart failure when the heart valves do not open and close properly. This makes the heart muscle pump harder to keep the blood flowing.  · Heart muscle disease (cardiomyopathy or myocarditis) is damage to the heart muscle from a variety of causes. These can include drug or alcohol abuse, infections, or unknown reasons. These can increase the risk of heart failure.  · Lung disease makes the heart work harder because the lungs do not work properly. This can cause a strain  on the heart, leading it to fail.  · Diabetes increases the risk of heart failure. High blood sugar contributes to high fat (lipid) levels in the blood. Diabetes can also cause slow damage to tiny blood vessels that carry important nutrients to the heart muscle. When the heart does not get enough oxygen and food, it can cause the heart to become weak and stiff. This leads to a heart that does not contract efficiently.  · Other conditions can contribute to heart failure. These include abnormal heart rhythms, thyroid problems, and low blood counts (anemia).  Certain unhealthy behaviors can increase the risk of heart failure. Those unhealthy behaviors include:  · Being overweight.  · Smoking or chewing tobacco.  · Eating foods high in fat and cholesterol.  · Abusing illicit drugs or alcohol.  · Lacking physical activity.  SYMPTOMS   Heart failure symptoms may vary and can be hard to detect. Symptoms may include:  · Shortness of breath with activity, such as climbing stairs.  · Persistent cough.  · Swelling of the feet, ankles, legs, or abdomen.  · Unexplained weight gain.  · Difficulty breathing when lying flat (orthopnea).  · Waking from sleep because of the need to sit up and get more air.  · Rapid heartbeat.  · Fatigue and loss of energy.  · Feeling lightheaded, dizzy, or close to fainting.  · Loss of appetite.  · Nausea.  · Increased urination during the night (nocturia).  DIAGNOSIS   A diagnosis   of heart failure is based on your history, symptoms, physical examination, and diagnostic tests.  Diagnostic tests for heart failure may include:  · Echocardiography.  · Electrocardiography.  · Chest X-ray.  · Blood tests.  · Exercise stress test.  · Cardiac angiography.  · Radionuclide scans.  TREATMENT   Treatment is aimed at managing the symptoms of heart failure. Medicines, behavioral changes, or surgical intervention may be necessary to treat heart failure.  · Medicines to help treat heart failure may  include:  ¨ Angiotensin-converting enzyme (ACE) inhibitors. This type of medicine blocks the effects of a blood protein called angiotensin-converting enzyme. ACE inhibitors relax (dilate) the blood vessels and help lower blood pressure.  ¨ Angiotensin receptor blockers. This type of medicine blocks the actions of a blood protein called angiotensin. Angiotensin receptor blockers dilate the blood vessels and help lower blood pressure.  ¨ Water pills (diuretics). Diuretics cause the kidneys to remove salt and water from the blood. The extra fluid is removed through urination. This loss of extra fluid lowers the volume of blood the heart pumps.  ¨ Beta blockers. These prevent the heart from beating too fast and improve heart muscle strength.  ¨ Digitalis. This increases the force of the heartbeat.  · Healthy behavior changes include:  ¨ Obtaining and maintaining a healthy weight.  ¨ Stopping smoking or chewing tobacco.  ¨ Eating heart healthy foods.  ¨ Limiting or avoiding alcohol.  ¨ Stopping illicit drug use.  ¨ Physical activity as directed by your caregiver.  · Surgical treatment for heart failure may include:  ¨ A procedure to open blocked arteries, repair damaged heart valves, or remove damaged heart muscle tissue.  ¨ A pacemaker to improve heart muscle function and control certain abnormal heart rhythms.  ¨ An internal cardioverter defibrillator to treat certain serious abnormal heart rhythms.  ¨ A left ventricular assist device to assist the pumping ability of the heart.  HOME CARE INSTRUCTIONS   · Take your medicine as directed by your caregiver. Medicines are important in reducing the workload of your heart, slowing the progression of heart failure, and improving your symptoms.  ¨ Do not stop taking your medicine unless directed by your caregiver.  ¨ Do not skip any dose of medicine.  ¨ Refill your prescriptions before you run out of medicine. Your medicines are needed every day.  ¨ Take over-the-counter  medicine only as directed by your caregiver or pharmacist.  · Engage in moderate physical activity if directed by your caregiver. Moderate physical activity can benefit some people. The elderly and people with severe heart failure should consult with a caregiver for physical activity recommendations.  · Eat heart healthy foods. Food choices should be free of trans fat and low in saturated fat, cholesterol, and salt (sodium). Healthy choices include fresh or frozen fruits and vegetables, fish, lean meats, legumes, fat-free or low-fat dairy products, and whole grain or high fiber foods. Talk to a dietitian to learn more about heart healthy foods.  · Limit sodium if directed by your caregiver. Sodium restriction may reduce symptoms of heart failure in some people. Talk to a dietitian to learn more about heart healthy seasonings.  · Use healthy cooking methods. Healthy cooking methods include roasting, grilling, broiling, baking, poaching, steaming, or stir-frying. Talk to a dietitian to learn more about healthy cooking methods.  · Limit fluids if directed by your caregiver. Fluid restriction may reduce symptoms of heart failure in some people.  · Weigh yourself every day.   Daily weights are important in the early recognition of excess fluid. You should weigh yourself every morning after you urinate and before you eat breakfast. Wear the same amount of clothing each time you weigh yourself. Record your daily weight. Provide your caregiver with your weight record.  · Monitor and record your blood pressure if directed by your caregiver.  · Check your pulse if directed by your caregiver.  · Lose weight if directed by your caregiver. Weight loss may reduce symptoms of heart failure in some people.  · Stop smoking or chewing tobacco. Nicotine makes your heart work harder by causing your blood vessels to constrict. Do not use nicotine gum or patches before talking to your caregiver.  · Schedule and attend follow-up visits as  directed by your caregiver. It is important to keep all your appointments.  · Limit alcohol intake to no more than 1 drink per day for nonpregnant women and 2 drinks per day for men. Drinking more than that is harmful to your heart. Tell your caregiver if you drink alcohol several times a week. Talk with your caregiver about whether alcohol is safe for you. If your heart has already been damaged by alcohol or you have severe heart failure, drinking alcohol should be stopped completely.  · Stop illicit drug use.  · Stay up-to-date with immunizations. It is especially important to prevent respiratory infections through current pneumococcal and influenza immunizations.  · Manage other health conditions such as hypertension, diabetes, thyroid disease, or abnormal heart rhythms as directed by your caregiver.  · Learn to manage stress.  · Plan rest periods when fatigued.  · Learn strategies to manage high temperatures. If the weather is extremely hot:  ¨ Avoid vigorous physical activity.  ¨ Use air conditioning or fans or seek a cooler location.  ¨ Avoid caffeine and alcohol.  ¨ Wear loose-fitting, lightweight, and light-colored clothing.  · Learn strategies to manage cold temperatures. If the weather is extremely cold:  ¨ Avoid vigorous physical activity.  ¨ Layer clothes.  ¨ Wear mittens or gloves, a hat, and a scarf when going outside.  ¨ Avoid alcohol.  · Obtain ongoing education and support as needed.  · Participate or seek rehabilitation as needed to maintain or improve independence and quality of life.  SEEK MEDICAL CARE IF:   · Your weight increases by 03 lb/1.4 kg in 1 day or 05 lb/2.3 kg in a week.  · You have increasing shortness of breath that is unusual for you.  · You are unable to participate in your usual physical activities.  · You tire easily.  · You cough more than normal, especially with physical activity.  · You have any or more swelling in areas such as your hands, feet, ankles, or abdomen.  · You  are unable to sleep because it is hard to breathe.  · You feel like your heart is beating fast (palpitations).  · You become dizzy or lightheaded upon standing up.  SEEK IMMEDIATE MEDICAL CARE IF:   · You have difficulty breathing.  · There is a change in mental status such as decreased alertness or difficulty with concentration.  · You have a pain or discomfort in your chest.  · You have an episode of fainting (syncope).  MAKE SURE YOU:   · Understand these instructions.  · Will watch your condition.  · Will get help right away if you are not doing well or get worse.  Document Released: 11/25/2005 Document Revised: 03/22/2013   Document Reviewed: 12/17/2012  [employer]® Patient Information ©2015 [employer]. This information is not intended to replace advice given to you by your health care provider. Make sure you discuss any questions you have with your health care provider.

## 2014-05-25 NOTE — H&P (View-Only) (Signed)
Physical Medicine and Rehabilitation Admission H&P    Chief Complaint  Patient presents with  . Chest Pain  : Chief Complaint :weakness  HPI: Erin Sexton is a 43 y.o. right-handed female with history of asthma, hypertension, diabetes mellitus with peripheral neuropathy, severe menorrhagia, morbid obesity, systolic congestive heart failure and hypertrophic cardiomyopathy. Independent prior to admission. Patient recently admitted with profuse vaginal bleeding receive transfusion of 6 units of packed red blood cells and discharge to home 05/17/2014. Patient readmitted 05/18/2014 with increasing shortness of breath. Chest x-ray consistent with congestive heart failure. Congestive heart failure felt likely exacerbated by volume of recent transfusion. Maintained on Lasix therapy as directed and followup per cardiology services. Mildly elevated troponin felt to be secondary to congestive heart failure not a candidate for invasive workup and management conservatively. A urine culture 05/20/2014 Escherichia coli maintained on Cipro. Hemoglobin monitored closely with latest hemoglobin 8.4. Contact precautions for MRSA PCR screening positive. Physical therapy evaluation completed 05/24/2014 secondary to deconditioning with recommendations for physical medicine rehabilitation consult. Admit for comprehensive rehabilitation program   ROS Review of Systems  Respiratory: Positive for shortness of breath.  Cardiovascular: Positive for palpitations.  Genitourinary:  Severe menorrhagia  Musculoskeletal: Positive for myalgias.  Psychiatric/Behavioral: Positive for depression.  Anxiety  All other systems reviewed and are negative  Past Medical History  Diagnosis Date  . COPD (chronic obstructive pulmonary disease)   . Depression   . Hypertension   . Asthma   . Anxiety   . Anemia   . Blood transfusion without reported diagnosis   . Diabetes mellitus without complication     a. Dx 02/2014.  [name]  HOCM (hypertrophic obstructive cardiomyopathy)     a. Dx 17001.  . Vaginal bleeding   . SVT (supraventricular tachycardia)     a. Noted on tele during 02/2014 adm for symptomatic anemia.  [name] NSVT (nonsustained ventricular tachycardia)     a. Noted on tele during 02/2014 adm for symptomatic anemia.  . Morbid obesity   . Abnormal CT scan     a. 02/2014: evaluated for dyspnea and initially placed on Xarelto for PE because radiologist could not exclude small peripheral pulmonary emboli because of limited contrast. However, after admission for ++vaginal bleeding several days later, it was felt she did NOT have PE - underwent further testing with neg VQ 03/06/14, neg LE duplex.   History reviewed. No pertinent past surgical history. Family History  Problem Relation Age of Onset  . CAD Neg Hx     No family history of EARLY CAD   Social History:  reports that she has never smoked. She has never used smokeless tobacco. She reports that she does not drink alcohol or use illicit drugs. Allergies: No Known Allergies Medications Prior to Admission  Medication Sig Dispense Refill  . albuterol (PROVENTIL HFA;VENTOLIN HFA) 108 (90 BASE) MCG/ACT inhaler Inhale 2 puffs into the lungs every 6 (six) hours as needed for wheezing or shortness of breath.      [name] albuterol (PROVENTIL) (2.5 MG/3ML) 0.083% nebulizer solution Take 3 mLs (2.5 mg total) by nebulization every 6 (six) hours as needed for wheezing or shortness of breath.  75 mL  12  . buPROPion (WELLBUTRIN XL) 150 MG 24 hr tablet Take 1 tablet (150 mg total) by mouth daily.  30 tablet  0  . ferrous sulfate 325 (65 FE) MG tablet Take 1 tablet (325 mg total) by mouth 2 (two) times daily with a meal.  60 tablet  0  . fluticasone (FLOVENT HFA) 110 MCG/ACT inhaler Inhale 2 puffs into the lungs 2 (two) times daily.  1 Inhaler  12  . levothyroxine (SYNTHROID, LEVOTHROID) 25 MCG tablet Take 25 mcg by mouth daily before breakfast.      . megestrol (MEGACE) 40 MG tablet  Take 4 tablets (160 mg total) by mouth daily.  120 tablet  1  . metFORMIN (GLUCOPHAGE) 500 MG tablet Take 1 tablet (500 mg total) by mouth 2 (two) times daily with a meal.  60 tablet  0  . pravastatin (PRAVACHOL) 20 MG tablet Take 1 tablet (20 mg total) by mouth daily.  30 tablet  1    Home: Home Living Family/patient expects to be discharged to:: Private residence Living Arrangements: Parent Available Help at Discharge: Family;Available 24 hours/day (supervision only) Type of Home: House Home Access: Stairs to enter [employer] of Steps: 4 Entrance Stairs-Rails: Right Home Layout: One level Home Equipment: None   Functional History: Prior Function Level of Independence: Independent  Functional Status:  Mobility: Bed Mobility Overal bed mobility: Needs Assistance Bed Mobility: Supine to Sit Supine to sit: Min assist Transfers Overall transfer level: Needs assistance Transfers: Sit to/from Stand Sit to Stand: Min assist General transfer comment: Needs cues and needs min steadying assist for standing.  Did not ambulate as pt dizzy and did appear to have orthostasis.       ADL:    Cognition: Cognition Overall Cognitive Status: Within Functional Limits for tasks assessed Orientation Level: Oriented X4 Cognition Arousal/Alertness: Awake/alert Behavior During Therapy: WFL for tasks assessed/performed Overall Cognitive Status: Within Functional Limits for tasks assessed  Physical Exam: Blood pressure 125/73, pulse 77, temperature 98.1 F (36.7 C), temperature source Oral, resp. rate 18, height 5\' 9"  (1.753 m), weight 171.414 kg (377 lb 14.4 oz), SpO2 98.00%. Physical Exam Constitutional: She is oriented to person, place, and time.  43 year old morbidly obese female resting comfortably.  HENT: oral mucosa pink, moist, dentition normal Head: Normocephalic.  Eyes: EOM are normal.  Neck: Normal range of motion. Neck supple. No thyromegaly present.  Cardiac:  regular rhythm. Systolic murmur. Limbs without edema Respiratory: Effort normal and breath sounds normal.  GI: Soft. Bowel sounds are normal. She exhibits no distension. There is no tenderness.   Skin: Skin is warm and dry.  Psychiatric: She has a normal mood and affect.  Neuro: alert and oriented x 3. CN exam normal. Normal DTR'smotor strength is 4+ bilateral deltoids, 5/5 bilateral biceps triceps grip, 3  bilateral hip flexors limited by panniculus, 4/5 bilateral knee extensors ankle dorsiflexors  Sensory intact to light touch in both lower extremity  Results for orders placed during the hospital encounter of 05/18/14 (from the past 48 hour(s))  GLUCOSE, CAPILLARY     Status: None   Collection Time    05/23/14  4:07 PM      Result Value Ref Range   Glucose-Capillary 98  70 - 99 mg/dL  GLUCOSE, CAPILLARY     Status: None   Collection Time    05/23/14 10:14 PM      Result Value Ref Range   Glucose-Capillary 84  70 - 99 mg/dL  CBC     Status: Abnormal   Collection Time    05/24/14  5:02 AM      Result Value Ref Range   WBC 9.4  4.0 - 10.5 K/uL   RBC 3.09 (*) 3.87 - 5.11 MIL/uL   Hemoglobin 8.4 (*) 12.0 - 15.0 g/dL  HCT 26.8 (*) 36.0 - 46.0 %   MCV 86.7  78.0 - 100.0 fL   MCH 27.2  26.0 - 34.0 pg   MCHC 31.3  30.0 - 36.0 g/dL   RDW 16.5 (*) 11.5 - 15.5 %   Platelets 325  150 - 400 K/uL  BASIC METABOLIC PANEL     Status: Abnormal   Collection Time    05/24/14  5:02 AM      Result Value Ref Range   Sodium 141  137 - 147 mEq/L   Potassium 3.8  3.7 - 5.3 mEq/L   Chloride 99  96 - 112 mEq/L   CO2 26  19 - 32 mEq/L   Glucose, Bld 86  70 - 99 mg/dL   BUN 14  6 - 23 mg/dL   Creatinine, Ser 1.07  0.50 - 1.10 mg/dL   Calcium 8.9  8.4 - 10.5 mg/dL   GFR calc non Af Amer 62 (*) >90 mL/min   GFR calc Af Amer 72 (*) >90 mL/min   Comment: (NOTE)     The eGFR has been calculated using the CKD EPI equation.     This calculation has not been validated in all clinical situations.      eGFR's persistently <90 mL/min signify possible Chronic Kidney     Disease.  GLUCOSE, CAPILLARY     Status: None   Collection Time    05/24/14  6:32 AM      Result Value Ref Range   Glucose-Capillary 90  70 - 99 mg/dL  GLUCOSE, CAPILLARY     Status: None   Collection Time    05/24/14 11:30 AM      Result Value Ref Range   Glucose-Capillary 82  70 - 99 mg/dL  GLUCOSE, CAPILLARY     Status: Abnormal   Collection Time    05/24/14  4:28 PM      Result Value Ref Range   Glucose-Capillary 101 (*) 70 - 99 mg/dL  GLUCOSE, CAPILLARY     Status: Abnormal   Collection Time    05/24/14  9:26 PM      Result Value Ref Range   Glucose-Capillary 122 (*) 70 - 99 mg/dL   Comment 1 Notify RN    CBC     Status: Abnormal   Collection Time    05/25/14  6:46 AM      Result Value Ref Range   WBC 8.5  4.0 - 10.5 K/uL   RBC 3.29 (*) 3.87 - 5.11 MIL/uL   Hemoglobin 8.9 (*) 12.0 - 15.0 g/dL   HCT 28.6 (*) 36.0 - 46.0 %   MCV 86.9  78.0 - 100.0 fL   MCH 27.1  26.0 - 34.0 pg   MCHC 31.1  30.0 - 36.0 g/dL   RDW 16.1 (*) 11.5 - 15.5 %   Platelets 339  150 - 400 K/uL  BASIC METABOLIC PANEL     Status: Abnormal   Collection Time    05/25/14  6:46 AM      Result Value Ref Range   Sodium 143  137 - 147 mEq/L   Potassium 3.9  3.7 - 5.3 mEq/L   Chloride 103  96 - 112 mEq/L   CO2 28  19 - 32 mEq/L   Glucose, Bld 99  70 - 99 mg/dL   BUN 12  6 - 23 mg/dL   Creatinine, Ser 0.93  0.50 - 1.10 mg/dL   Calcium 8.9  8.4 - 10.5 mg/dL  GFR calc non Af Amer 73 (*) >90 mL/min   GFR calc Af Amer 85 (*) >90 mL/min   Comment: (NOTE)     The eGFR has been calculated using the CKD EPI equation.     This calculation has not been validated in all clinical situations.     eGFR's persistently <90 mL/min signify possible Chronic Kidney     Disease.  GLUCOSE, CAPILLARY     Status: Abnormal   Collection Time    05/25/14  6:48 AM      Result Value Ref Range   Glucose-Capillary 106 (*) 70 - 99 mg/dL  GLUCOSE, CAPILLARY      Status: Abnormal   Collection Time    05/25/14 11:14 AM      Result Value Ref Range   Glucose-Capillary 125 (*) 70 - 99 mg/dL   No results found.     Medical Problem List and Plan: 1. Functional deficits secondary to deconditioning after acute exacerbation of chronic diastolic congestive heart failure 2.  DVT Prophylaxis/Anticoagulation: SCD. Monitor for any signs of DVT 3. Pain Management: Tylenol as needed. Monitor the increased mobility 4. Mood/depression: Wellbutrin 150 mg daily. Provide emotional support 5. Neuropsych: This patient is capable of making decisions on her own behalf. 6. Diabetes mellitus with peripheral neuropathy. Latest hemoglobin A1c 5.7. Glucophage 500 mg twice a day. Check blood sugars a.c. and at bedtime. Diabetic teaching 7.Severe menorrhagia. Patient transfuse 6 units total packed red blood cells after recent admission. Followup with Dr. Elly Modena with OB/GYN. Continue Megace per OB/GYN 8. Anemia. Improved with latest hemoglobin 8.9. Ferritin 9 iron 15. Continue iron supplement. Followup CBC 9. Hypertension. Verapamil 180 mg daily, Lopressor 25 mg twice a day, Lasix 40 mg twice a day. Monitor for any signs of fluid overload. 10. Escherichia coli urinary tract infection. Cipro 500 mg twice a day initiated 05/23/2014. 11. Hypothyroidism. Synthroid 12. MRSA PCR screening positive. Continue contact precautions   Post Admission Physician Evaluation: 1. Functional deficits secondary  to deconditioning related to CHF, morbid obesity. 2. Patient is admitted to receive collaborative, interdisciplinary care between the physiatrist, rehab nursing staff, and therapy team. 3. Patient's level of medical complexity and substantial therapy needs in context of that medical necessity cannot be provided at a lesser intensity of care such as a SNF. 4. Patient has experienced substantial functional loss from his/her baseline which was documented above under the "Functional  History" and "Functional Status" headings.  Judging by the patient's diagnosis, physical exam, and functional history, the patient has potential for functional progress which will result in measurable gains while on inpatient rehab.  These gains will be of substantial and practical use upon discharge  in facilitating mobility and self-care at the household level. 5. Physiatrist will provide 24 hour management of medical needs as well as oversight of the therapy plan/treatment and provide guidance as appropriate regarding the interaction of the two. 6. 24 hour rehab nursing will assist with bladder management, bowel management, safety, skin/wound care, disease management, medication administration, pain management and patient education  and help integrate therapy concepts, techniques,education, etc. 7. PT will assess and treat for/with: Lower extremity strength, range of motion, balance, functional mobility, safety, adaptive techniques and equipment, CV stamina, pain control, potential edema mgt .   Goals are: mod I. 8. OT will assess and treat for/with: ADL's, functional mobility, safety, upper extremity strength, adaptive techniques and equipment, CV stamina, pain control, egosupport.   Goals are: mod I. 9. SLP will assess and treat  for/with: n/a.  Goals are: n/a. 10. Case Management and Social Worker will assess and treat for psychological issues and discharge planning. 11. Team conference will be held weekly to assess progress toward goals and to determine barriers to discharge. 12. Patient will receive at least 3 hours of therapy per day at least 5 days per week. 13. ELOS: 7 days       14. Prognosis:  excellent     Meredith Staggers, MD, [facility]   05/25/2014

## 2014-05-25 NOTE — Progress Notes (Signed)
CSW met with patient and patient's mom by bedside to discuss SNF as a backup plan. Patient states she really wants to go to CIR, but would be open to SNF as a backup. CSW then received a call from RN Case Manager. Per Case Manager, patient is going to CIR today. Patient pleased with CIR. CSW will sign off at this time as there are no longer social work needs.  Jeanette Caprice, MSW, [place]

## 2014-05-26 ENCOUNTER — Inpatient Hospital Stay (HOSPITAL_COMMUNITY): Payer: Medicare Other | Admitting: Occupational Therapy

## 2014-05-26 ENCOUNTER — Inpatient Hospital Stay (HOSPITAL_COMMUNITY): Payer: Medicare Other | Admitting: Physical Therapy

## 2014-05-26 DIAGNOSIS — I509 Heart failure, unspecified: Secondary | ICD-10-CM

## 2014-05-26 DIAGNOSIS — R5381 Other malaise: Secondary | ICD-10-CM

## 2014-05-26 DIAGNOSIS — E669 Obesity, unspecified: Secondary | ICD-10-CM

## 2014-05-26 LAB — GLUCOSE, CAPILLARY
GLUCOSE-CAPILLARY: 117 mg/dL — AB (ref 70–99)
GLUCOSE-CAPILLARY: 100 mg/dL — AB (ref 70–99)
Glucose-Capillary: 102 mg/dL — ABNORMAL HIGH (ref 70–99)
Glucose-Capillary: 94 mg/dL (ref 70–99)

## 2014-05-26 LAB — CBC WITH DIFFERENTIAL/PLATELET
BASOS PCT: 0 % (ref 0–1)
Basophils Absolute: 0 10*3/uL (ref 0.0–0.1)
EOS ABS: 0.2 10*3/uL (ref 0.0–0.7)
EOS PCT: 3 % (ref 0–5)
HCT: 28.2 % — ABNORMAL LOW (ref 36.0–46.0)
HEMOGLOBIN: 8.9 g/dL — AB (ref 12.0–15.0)
LYMPHS ABS: 2.5 10*3/uL (ref 0.7–4.0)
Lymphocytes Relative: 30 % (ref 12–46)
MCH: 27.4 pg (ref 26.0–34.0)
MCHC: 31.6 g/dL (ref 30.0–36.0)
MCV: 86.8 fL (ref 78.0–100.0)
MONOS PCT: 5 % (ref 3–12)
Monocytes Absolute: 0.4 10*3/uL (ref 0.1–1.0)
Neutro Abs: 5.2 10*3/uL (ref 1.7–7.7)
Neutrophils Relative %: 62 % (ref 43–77)
Platelets: 340 10*3/uL (ref 150–400)
RBC: 3.25 MIL/uL — ABNORMAL LOW (ref 3.87–5.11)
RDW: 16.2 % — ABNORMAL HIGH (ref 11.5–15.5)
WBC: 8.3 10*3/uL (ref 4.0–10.5)

## 2014-05-26 LAB — TYPE AND SCREEN
ABO/RH(D): O POS
Antibody Screen: NEGATIVE
UNIT DIVISION: 0
Unit division: 0

## 2014-05-26 LAB — COMPREHENSIVE METABOLIC PANEL
ALBUMIN: 3.2 g/dL — AB (ref 3.5–5.2)
ALT: 29 U/L (ref 0–35)
AST: 23 U/L (ref 0–37)
Alkaline Phosphatase: 55 U/L (ref 39–117)
BUN: 12 mg/dL (ref 6–23)
CALCIUM: 9 mg/dL (ref 8.4–10.5)
CO2: 26 mEq/L (ref 19–32)
CREATININE: 1.03 mg/dL (ref 0.50–1.10)
Chloride: 103 mEq/L (ref 96–112)
GFR calc non Af Amer: 65 mL/min — ABNORMAL LOW (ref >90)
GFR, EST AFRICAN AMERICAN: 75 mL/min — AB (ref >90)
GLUCOSE: 103 mg/dL — AB (ref 70–99)
Potassium: 3.8 mEq/L (ref 3.7–5.3)
Sodium: 143 mEq/L (ref 137–147)
TOTAL PROTEIN: 6.4 g/dL (ref 6.0–8.3)
Total Bilirubin: 0.4 mg/dL (ref 0.3–1.2)

## 2014-05-26 NOTE — Progress Notes (Signed)
Physical Therapy Session Note  Patient Details  Name: Erin Sexton MRN: [id number] Date of Birth: 28-Dec-1970  Today's Date: 05/26/2014 Time: 0211-1735 Time Calculation (min): 60 min  Short Term Goals: Week 1:   =LTG  Skilled Therapeutic Interventions/Progress Updates:  Ambulation/gait training;Pain management;Stair training;Balance/vestibular training;Patient/family education;Therapeutic Activities;Wheelchair propulsion/positioning;Disease Editor, commissioning;Therapeutic Exercise;Functional mobility training;Community reintegration;UE/LE Strength taining/ROM;Discharge planning;Neuromuscular re-education;UE/LE Coordination activities   Therapy Documentation Precautions:  Precautions Precautions: Fall Precaution Comments: Cardiac, morbid obesity Restrictions Weight Bearing Restrictions: No   Other Treatments: Treatments Neuromuscular Facilitation:  (Facilitation of diaphrgamatic breathing and lateral costal expansion 2'x2 in supine then 2'x2 in sitting. pt educated on ventilatory cues with mobility. Pec stretch with myofascial release and thoracic myofascial  performed. See education as per pt education flow sheet)  See FIM for current functional status  Therapy/Group: Individual Therapy  Monia Pouch 05/26/2014, 4:55 PM

## 2014-05-26 NOTE — Progress Notes (Signed)
Physical Therapy Session Note  Patient Details  Name: Erin Sexton MRN: [id number] Date of Birth: 03/03/1971  Today's Date: 05/26/2014 Time: 1630-1700 Time Calculation (min): 30 min  Short Term Goals: Week 1:  PT Short Term Goal 1 (Week 1): = LTGs due to anticipated LOS  Skilled Therapeutic Interventions/Progress Updates:  Pt received sitting in w/c, reporting fatigue but agreeable to therapy. Session focused on w/c management and overall activity tolerance. Pt with no c/o pain.Discussed falls risk, safety within room, and focus of therapy during stay. Patient fitted for w/c and educated on donning/doffing leg rests and brakes. Pt requires S with w/c parts management. W/c propulsion using BUEs 200 ft x 2 with S. Gait training x 90 ft using bari RW and SBA. Pt demo narrow BOS with some scissoring, able to correct with min vc's. Pt left sitting in recliner with dinner tray, all needs within reach.   Therapy Documentation Precautions:  Precautions Precautions: Fall Precaution Comments: Cardiac, morbid obesity Restrictions Weight Bearing Restrictions: No  See FIM for current functional status  Therapy/Group: Individual Therapy  [name] 05/26/2014, 5:38 PM

## 2014-05-26 NOTE — Progress Notes (Signed)
[facility] PHYSICAL MEDICINE & REHABILITATION     PROGRESS NOTE    Subjective/Complaints: Had a good night. Slept well. Denies cough, sob at rest.  A  review of systems has been performed and if not noted above is otherwise negative.   Objective: Vital Signs: Blood pressure 116/73, pulse 75, temperature 97.5 F (36.4 C), temperature source Oral, resp. rate 18, weight 167.377 kg (369 lb), last menstrual period 05/01/2014, SpO2 96.00%. No results found.  Recent Labs  05/25/14 0646 05/26/14 0620  WBC 8.5 8.3  HGB 8.9* 8.9*  HCT 28.6* 28.2*  PLT 339 340    Recent Labs  05/25/14 0646 05/26/14 0620  NA 143 143  K 3.9 3.8  CL 103 103  GLUCOSE 99 103*  BUN 12 12  CREATININE 0.93 1.03  CALCIUM 8.9 9.0   CBG (last 3)   Recent Labs  05/25/14 1114 05/25/14 1605 05/25/14 2217  GLUCAP 125* 129* 101*    Wt Readings from Last 3 Encounters:  05/26/14 167.377 kg (369 lb)  05/25/14 171.414 kg (377 lb 14.4 oz)  05/15/14 180.3 kg (397 lb 7.8 oz)    Physical Exam:  Constitutional: She is oriented to person, place, and time.  43 year old morbidly obese female resting comfortably.  HENT: oral mucosa pink, moist, dentition normal  Head: Normocephalic.  Eyes: EOM are normal.  Neck: Normal range of motion. Neck supple. No thyromegaly present.  Cardiac: regular rhythm. Systolic murmur. Limbs without edema  Respiratory: Effort normal and breath sounds normal.  GI: Soft. Bowel sounds are normal. She exhibits no distension. There is no tenderness.  Skin: Skin is warm and dry. No breakdown Psychiatric: She has a normal mood and affect.  Neuro: alert and oriented x 3. CN exam normal. Normal DTR'smotor strength is 4+ bilateral deltoids, 5/5 bilateral biceps triceps grip, 3+ bilateral hip flexors limited by panniculus, 4/5 bilateral knee extensors ankle dorsiflexors  Sensory intact to light touch in both lower extremity   Assessment/Plan: 1. Functional deficits secondary to  deconditioning after CHF which require 3+ hours per day of interdisciplinary therapy in a comprehensive inpatient rehab setting. Physiatrist is providing close team supervision and 24 hour management of active medical problems listed below. Physiatrist and rehab team continue to assess barriers to discharge/monitor patient progress toward functional and medical goals. FIM:                   Comprehension Comprehension Mode: Auditory Comprehension: 7-Follows complex conversation/direction: With no assist  Expression Expression Mode: Verbal Expression: 7-Expresses complex ideas: With no assist  Social Interaction Social Interaction: 7-Interacts appropriately with others - No medications needed.        Medical Problem List and Plan:  1. Functional deficits secondary to deconditioning after acute exacerbation of chronic diastolic congestive heart failure  2. DVT Prophylaxis/Anticoagulation: SCD. Monitor for any signs of DVT  3. Pain Management: Tylenol as needed. Monitor the increased mobility  4. Mood/depression: Wellbutrin 150 mg daily. Provide emotional support  5. Neuropsych: This patient is capable of making decisions on her own behalf.  6. Diabetes mellitus with peripheral neuropathy. Latest hemoglobin A1c 5.7. Glucophage 500 mg twice a day.  -sugars under control at present 7.Severe menorrhagia. Patient transfused 6 units total packed red blood cells after recent admission. Followup with Dr. Elly Sexton with OB/GYN. Continue Megace per OB/GYN  8. Anemia. Improved with latest hemoglobin 8.9. Ferritin 9 iron 15. Continue iron supplement. Followup CBC  9. Hypertension. Verapamil 180 mg daily, Lopressor 25 mg twice  a day, Lasix 40 mg twice a day. Monitor for any signs of fluid overload.  10. Escherichia coli urinary tract infection. Cipro 500 mg twice a day initiated 05/23/2014.  11. Hypothyroidism. Synthroid  12. MRSA PCR screening positive. Continue contact precautions  LOS  (Days) 1 A FACE TO FACE EVALUATION WAS PERFORMED  Erin Sexton,ZACHARY T 05/26/2014 8:10 AM

## 2014-05-26 NOTE — Evaluation (Signed)
Occupational Therapy Assessment and Plan  Patient Details  Name: Erin Sexton MRN: [id number] Date of Birth: 1971/03/03  OT Diagnosis: muscle weakness (generalized) Rehab Potential: Rehab Potential: Good ELOS: 7 days   Today's Date: 05/26/2014 Time: 0730-0823 Time Calculation (min): 53 min  Problem List:  Patient Active Problem List   Diagnosis Date Noted  . Physical deconditioning 05/25/2014  . Catheter-associated urinary tract infection 05/20/2014  . Hypokalemia 05/20/2014  . Acute on chronic diastolic congestive heart failure 05/18/2014  . Diabetes mellitus type 2, noninsulin dependent   . OSA (obstructive sleep apnea) 03/21/2014  . Vaginal bleeding 03/06/2014  . Acute blood loss anemia 03/06/2014  . HOCM (hypertrophic obstructive cardiomyopathy) 03/06/2014  . Morbid obesity 03/02/2014  . Essential hypertension, benign 03/02/2014  . Asthma, chronic 03/02/2014    Past Medical History:  Past Medical History  Diagnosis Date  . COPD (chronic obstructive pulmonary disease)   . Depression   . Hypertension   . Asthma   . Anxiety   . Anemia   . Blood transfusion without reported diagnosis   . Diabetes mellitus without complication     a. Dx 02/2014.  [name] HOCM (hypertrophic obstructive cardiomyopathy)     a. Dx 53299.  . Vaginal bleeding   . SVT (supraventricular tachycardia)     a. Noted on tele during 02/2014 adm for symptomatic anemia.  [name] NSVT (nonsustained ventricular tachycardia)     a. Noted on tele during 02/2014 adm for symptomatic anemia.  . Morbid obesity   . Abnormal CT scan     a. 02/2014: evaluated for dyspnea and initially placed on Xarelto for PE because radiologist could not exclude small peripheral pulmonary emboli because of limited contrast. However, after admission for ++vaginal bleeding several days later, it was felt she did NOT have PE - underwent further testing with neg VQ 03/06/14, neg LE duplex.   Past Surgical History: No past surgical history on  file.  Assessment & Plan Clinical Impression: Patient is a 43 y.o. year old right-handed female with history of asthma, hypertension, diabetes mellitus with peripheral neuropathy, severe menorrhagia, morbid obesity, systolic congestive heart failure and hypertrophic cardiomyopathy. Independent prior to admission. Patient recently admitted with profuse vaginal bleeding receive transfusion of 6 units of packed red blood cells and discharge to home 05/17/2014. Patient readmitted 05/18/2014 with increasing shortness of breath. Chest x-ray consistent with congestive heart failure. Congestive heart failure felt likely exacerbated by volume of recent transfusion. Maintained on Lasix therapy as directed and followup per cardiology services. Mildly elevated troponin felt to be secondary to congestive heart failure not a candidate for invasive workup and management conservatively. A urine culture 05/20/2014 Escherichia coli maintained on Cipro. Hemoglobin monitored closely with latest hemoglobin 8.4. Contact precautions for MRSA PCR screening positive.    Patient transferred to [facility] on 05/25/2014 .    Patient currently requires min with basic self-care skills and basic mobility secondary to muscle weakness and decr activity tolerance, decreased cardiorespiratoy endurance and decreased standing balance and decreased balance strategies.  Prior to hospitalization, patient could complete ADL with independent .  Patient will benefit from skilled intervention to decrease level of assist with basic self-care skills and increase independence with basic self-care skills prior to discharge home independently.  Anticipate patient will require only A with IADLs prn and no further OT follow recommended.  OT - End of Session Activity Tolerance: Tolerates 10 - 20 min activity with multiple rests Endurance Deficit: Yes OT Assessment Rehab Potential: Good OT Patient demonstrates  impairments in the following area(s):  Balance;Endurance;Motor;Safety OT Basic ADL's Functional Problem(s): Grooming;Bathing;Dressing;Toileting OT Advanced ADL's Functional Problem(s): Simple Meal Preparation OT Transfers Functional Problem(s): Toilet;Tub/Shower OT Additional Impairment(s): None OT Plan OT Intensity: Minimum of 1-2 x/day, 45 to 90 minutes OT Frequency: 5 out of 7 days OT Duration/Estimated Length of Stay: 7 days OT Treatment/Interventions: Balance/vestibular training;Discharge planning;Community reintegration;Patient/family education;DME/adaptive equipment instruction;Therapeutic Activities;Therapeutic Exercise;Psychosocial support;Self Care/advanced ADL retraining;UE/LE Strength taining/ROM;Functional mobility training;Skin care/wound managment;Disease mangement/prevention OT Self Feeding Anticipated Outcome(s): no goal  OT Basic Self-Care Anticipated Outcome(s): mod I  OT Toileting Anticipated Outcome(s): mod I  OT Bathroom Transfers Anticipated Outcome(s): mod I  OT Recommendation Patient destination: Home Follow Up Recommendations: None Equipment Recommended: To be determined   Skilled Therapeutic Intervention Ot eval initiated with OT goals, purpose and role discussed. Self care retraining at shower level with focus on bed mobility from flat bed with bed rail for trunk assistance, functional ambulation around room with RW with steady A, shower stall transfers, showering sitting on shower seat. Pt able to maintain standing balance with steady A while propping one LE on seat to wash peri area without LOB. Pt with decr activity tolerance demonstrated by requiring prolonged rest break after shower and reporting she felt so tired. Stood at sink to perform grooming with supervision.   2nd session 11:00-11:45 no c/o pain  1:1 focus on activity tolerance and endurance with functional ambulation with RW, sit to stands, standing balance, LB strengthening with stepping up on to step and toe tapping with weight shifting.  PT with decr standing tolerance requiring frequent rest break. Navigated through obstacle course with RW with close supervision and more than reasonable amt of time  OT Evaluation Precautions/Restrictions  Precautions Precautions: Fall Restrictions Weight Bearing Restrictions: No General Chart Reviewed: Yes Family/Caregiver Present: No Vital Signs Oxygen Therapy O2 Device: None (Room air) Pain Pain Assessment Pain Assessment: No/denies pain Pain Score: 0-No pain Home Living/Prior Functioning Home Living Family/patient expects to be discharged to:: Private residence Living Arrangements: Parent Available Help at Discharge: Family;Available 24 hours/day Type of Home: House Home Access: Stairs to enter [employer] of Steps: 4 Entrance Stairs-Rails: Right Home Layout: One level  Lives With: Other (Comment) (lives with her mother ) ADL  see FIM Vision/Perception  Vision- History Baseline Vision/History: No visual deficits Perception Comments: WFL  Cognition Overall Cognitive Status: Within Functional Limits for tasks assessed Arousal/Alertness: Awake/alert Orientation Level: Oriented X4 Attention: Alternating Focused Attention: Appears intact Alternating Attention: Appears intact Memory: Appears intact Awareness: Appears intact Problem Solving: Appears intact Safety/Judgment: Appears intact Sensation Sensation Light Touch: Appears Intact Proprioception: Appears Intact Motor  Motor Motor - Skilled Clinical Observations: generalized weakness Mobility  Transfers Transfers: Sit to Stand;Stand to Sit Sit to Stand: 4: Min assist Stand to Sit: 4: Min assist  Trunk/Postural Assessment  Cervical Assessment Cervical Assessment:  (forward flexed) Postural Control Postural Control: Deficits on evaluation Righting Reactions: delayed  Balance Balance Balance Assessed: Yes Static Sitting Balance Static Sitting - Level of Assistance: 6: Modified independent  (Device/Increase time) Dynamic Sitting Balance Dynamic Sitting - Level of Assistance: 5: Stand by assistance Static Standing Balance Static Standing - Level of Assistance: 4: Min assist Extremity/Trunk Assessment RUE Assessment RUE Assessment: Within Functional Limits LUE Assessment LUE Assessment: Within Functional Limits  FIM:  FIM - Eating Eating Activity: 7: Complete independence:no helper FIM - Grooming Grooming Steps: Wash, rinse, dry face;Wash, rinse, dry hands;Oral care, brush teeth, clean dentures;Brush, comb hair Grooming: 5: Set-up assist to obtain items  FIM - Bathing Bathing Steps Patient Completed: Chest;Right Arm;Right lower leg (including foot);Left lower leg (including foot);Left Arm;Abdomen;Front perineal area;Buttocks;Right upper leg;Left upper leg Bathing: 4: Steadying assist FIM - Upper Body Dressing/Undressing Upper body dressing/undressing steps patient completed: Put head through opening of pull over shirt/dress;Thread/unthread left sleeve of pullover shirt/dress;Thread/unthread right sleeve of pullover shirt/dresss;Pull shirt over trunk Upper body dressing/undressing: 5: Set-up assist to: Obtain clothing/put away FIM - Lower Body Dressing/Undressing Lower body dressing/undressing steps patient completed: Thread/unthread right underwear leg;Thread/unthread left underwear leg;Pull underwear up/down;Thread/unthread right pants leg;Thread/unthread left pants leg;Pull pants up/down;Don/Doff right sock;Don/Doff left sock Lower body dressing/undressing: 4: Steadying Assist FIM - Bed/Chair Transfer Bed/Chair Transfer: 4: Supine > Sit: Min A (steadying Pt. > 75%/lift 1 leg);4: Bed > Chair or W/C: Min A (steadying Pt. > 75%) FIM - Systems developer Devices: Shower chair;Grab bars;Walker;Walk in shower Tub/shower Transfers: 4-Into Tub/Shower: Min A (steadying Pt. > 75%/lift 1 leg);4-Out of Tub/Shower: Min A (steadying Pt. > 75%/lift 1 leg)   Refer  to Care Plan for Long Term Goals  Recommendations for other services: None  Discharge Criteria: Patient will be discharged from OT if patient refuses treatment 3 consecutive times without medical reason, if treatment goals not met, if there is a change in medical status, if patient makes no progress towards goals or if patient is discharged from hospital.  The above assessment, treatment plan, treatment alternatives and goals were discussed and mutually agreed upon: by patient    Nicoletta Ba 05/26/2014, 9:51 AM

## 2014-05-26 NOTE — Evaluation (Signed)
Physical Therapy Assessment and Plan  Patient Details  Name: Erin Sexton MRN: [id number] Date of Birth: November 09, 1971  PT Diagnosis: Difficulty walking Rehab Potential: Good ELOS: 7-10 days   Today's Date: 05/26/2014 Time: 2725-3664 Time Calculation (min): 45 min  Problem List:  Patient Active Problem List   Diagnosis Date Noted  . Physical deconditioning 05/25/2014  . Catheter-associated urinary tract infection 05/20/2014  . Hypokalemia 05/20/2014  . Acute on chronic diastolic congestive heart failure 05/18/2014  . Diabetes mellitus type 2, noninsulin dependent   . OSA (obstructive sleep apnea) 03/21/2014  . Vaginal bleeding 03/06/2014  . Acute blood loss anemia 03/06/2014  . HOCM (hypertrophic obstructive cardiomyopathy) 03/06/2014  . Morbid obesity 03/02/2014  . Essential hypertension, benign 03/02/2014  . Asthma, chronic 03/02/2014    Past Medical History:  Past Medical History  Diagnosis Date  . COPD (chronic obstructive pulmonary disease)   . Depression   . Hypertension   . Asthma   . Anxiety   . Anemia   . Blood transfusion without reported diagnosis   . Diabetes mellitus without complication     a. Dx 02/2014.  [name] HOCM (hypertrophic obstructive cardiomyopathy)     a. Dx 40347.  . Vaginal bleeding   . SVT (supraventricular tachycardia)     a. Noted on tele during 02/2014 adm for symptomatic anemia.  [name] NSVT (nonsustained ventricular tachycardia)     a. Noted on tele during 02/2014 adm for symptomatic anemia.  . Morbid obesity   . Abnormal CT scan     a. 02/2014: evaluated for dyspnea and initially placed on Xarelto for PE because radiologist could not exclude small peripheral pulmonary emboli because of limited contrast. However, after admission for ++vaginal bleeding several days later, it was felt she did NOT have PE - underwent further testing with neg VQ 03/06/14, neg LE duplex.   Past Surgical History: No past surgical history on file.  Assessment &  Plan Clinical Impression: Patient is a 43 y.o. year old female with recent admission to the hospital as per H and P: "Erin Sexton is a 43 y.o. right-handed female with history of asthma, hypertension, diabetes mellitus with peripheral neuropathy, severe menorrhagia, morbid obesity, systolic congestive heart failure and hypertrophic cardiomyopathy. Independent prior to admission. Patient recently admitted with profuse vaginal bleeding receive transfusion of 6 units of packed red blood cells and discharge to home 05/17/2014. Patient readmitted 05/18/2014 with increasing shortness of breath. Chest x-ray consistent with congestive heart failure. Congestive heart failure felt likely exacerbated by volume of recent transfusion. Maintained on Lasix therapy as directed and followup per cardiology services. Mildly elevated troponin felt to be secondary to congestive heart failure not a candidate for invasive workup and management conservatively. A urine culture 05/20/2014 Escherichia coli maintained on Cipro. Hemoglobin monitored closely with latest hemoglobin 8.4. Contact precautions for MRSA PCR screening positive. Physical therapy evaluation completed 05/24/2014 secondary to deconditioning with recommendations for physical medicine rehabilitation consult. Admit for comprehensive rehabilitation program"  Patient transferred to CIR on 05/25/2014 .   Patient currently requires min with mobility secondary to muscle weakness and deconditioning  Prior to hospitalization, patient was independent  with mobility and lived with Other (Comment) (lives with her mother ) in a House home.  Home access is 4Stairs to enter.  Patient will benefit from skilled PT intervention to maximize safe functional mobility for planned discharge home with intermittent assist.  Anticipate patient will benefit from follow up OP at discharge.  PT - End of Session Endurance  Deficit: Yes PT Assessment Rehab Potential: Good Barriers to  Discharge: Decreased caregiver support Barriers to Discharge Comments: Mother able to provide supervision but not assist PT Patient demonstrates impairments in the following area(s): Balance;Endurance;Motor;Safety;Other (comment) (ROM) PT Transfers Functional Problem(s): Bed Mobility;Bed to Chair PT Locomotion Functional Problem(s): Ambulation;Stairs PT Plan PT Intensity: Minimum of 1-2 x/day ,45 to 90 minutes PT Frequency: 5 out of 7 days PT Duration Estimated Length of Stay: 7-10 days PT Treatment/Interventions: Ambulation/gait training;Pain management;Stair training;Balance/vestibular training;Patient/family education;Therapeutic Activities;Wheelchair propulsion/positioning;Disease Editor, commissioning;Therapeutic Exercise;Functional mobility training;Community reintegration;UE/LE Strength taining/ROM;Discharge planning;Neuromuscular re-education;UE/LE Coordination activities PT Transfers Anticipated Outcome(s): Mod I PT Locomotion Anticipated Outcome(s): Mod I PT Recommendation Follow Up Recommendations: Outpatient PT;24 hour supervision/assistance Patient destination: Home Equipment Recommended: Rolling walker with 5" wheels;Wheelchair (measurements) Equipment Details: TBD  Skilled Therapeutic Intervention   PT Evaluation Precautions/Restrictions Precautions Precautions: Fall Precaution Comments: Cardiac, morbid obesity General Chart Reviewed: Yes Family/Caregiver Present: Yes (mother present initially in session) Vital SignsTherapy Vitals Temp: 98.2 F (36.8 C) Temp src: Oral Pulse Rate: 80 Resp: 18 BP: 133/69 mmHg (with amb) Patient Position (if appropriate): Sitting Oxygen Therapy SpO2: 98 % O2 Device: None (Room air) Pain   Home Living/Prior Functioning Home Living Available Help at Discharge: Family;Available 24 hours/day Type of Home: House Home Access: Stairs to enter [employer] of Steps: 4 Entrance  Stairs-Rails: Right Home Layout: One level  Lives With: Other (Comment) (lives with her mother ) Vision/Perception     Cognition Arousal/Alertness: Awake/alert Comments: and follows mult-step comamnds Sensation Sensation Additional Comments: Not tested Coordination Gross Motor Movements are Fluid and Coordinated: Yes Fine Motor Movements are Fluid and Coordinated: Yes Motor  Motor Motor: Within Functional Limits  Mobility Bed Mobility Bed Mobility: Supine to Sit;Sit to Supine Supine to Sit: 5: Supervision Sit to Supine: 5: Supervision Transfers Transfers: Yes Sit to Stand: 4: Min guard (CGA with cues for hand placement and technique) Stand to Sit: 4: Min guard (CGA with cues for hand placement and technique) Locomotion  Ambulation Ambulation: Yes Ambulation/Gait Assistance: 4: Min guard (CGA with cues for posture and pacing) Ambulation Distance (Feet): 50 Feet Assistive device: Rolling walker Gait Gait: Yes Gait Pattern: Scissoring (forward flexed posture with rounded shoulders) Stairs / Additional Locomotion Stairs: Yes Stairs Assistance: 4: Min guard (CGA with cues for pacing and ventilation) Stair Management Technique: Two rails Number of Stairs: 2 Height of Stairs: 6 Wheelchair Mobility Wheelchair Mobility: Yes Wheelchair Assistance: 5: Careers information officer: Both lower extermities Wheelchair Parts Management: Needs assistance Distance: 27' with cues for pacing in recliner  Trunk/Postural Assessment  Thoracic Assessment Thoracic Assessment: Exceptions to [facility] (thoracic hypomobility noted) Postural Control Postural Control: Deficits on evaluation (slouched posture, rounded shoulders, posterior pelvic tilt, forward flexed in standing)  Balance Dynamic Sitting Balance Dynamic Sitting - Balance Support: No upper extremity supported Dynamic Sitting - Level of Assistance: 6: Modified independent (Device/Increase time) Dynamic Sitting Balance -  Compensations: to mod excursion Static Standing Balance Static Standing - Balance Support: Bilateral upper extremity supported Static Standing - Level of Assistance: 4: Min assist (CGA with cues for posture) Extremity Assessment  RUE Assessment RUE Assessment: Exceptions to [facility] RUE Strength RUE Overall Strength: Deficits RUE Overall Strength Comments: Grossly 4/5 MMT throughout LUE Assessment LUE Assessment: Exceptions to [facility] LUE Strength LUE Overall Strength Comments: grossly 4/5 MMT throughout RLE Assessment RLE Assessment: Exceptions to [facility] RLE AROM (degrees) Overall AROM Right Lower Extremity: Deficits RLE Overall AROM Comments: hip flexion grossly 100 degrees AROM, 110 degrees PROM,  ankle DF 5 degrees AROM/PROM knee extended RLE Strength RLE Overall Strength: Deficits RLE Overall Strength Comments: grossly 4/5 except 3+/5 hip MMT and 3-/5 hip flexion and ankle DF LLE Assessment LLE Assessment: Exceptions to [facility] LLE AROM (degrees) Overall AROM Left Lower Extremity: Deficits LLE Overall AROM Comments: hip flexion grossly 100 degrees AROM, 110 degrees PROM LLE Strength LLE Overall Strength: Deficits LLE Overall Strength Comments: grossly 4/5 except 3+/5 hip MMT and 3-/5 hip flexion  FIM:  FIM - Locomotion: Wheelchair Distance: 63' with cues for pacing in recliner FIM - Locomotion: Ambulation Ambulation/Gait Assistance: 4: Min guard (CGA with cues for posture and pacing)   Refer to Care Plan for Long Term Goals  Recommendations for other services: None  Discharge Criteria: Patient will be discharged from PT if patient refuses treatment 3 consecutive times without medical reason, if treatment goals not met, if there is a change in medical status, if patient makes no progress towards goals or if patient is discharged from hospital.  The above assessment, treatment plan, treatment alternatives and goals were discussed and mutually agreed upon: by patient  Monia Pouch 05/26/2014, 4:07 PM

## 2014-05-26 NOTE — Progress Notes (Signed)
Patient information reviewed and entered into eRehab system by Marie Noel, RN, CRRN, PPS Coordinator.  Information including medical coding and functional independence measure will be reviewed and updated through discharge.     Per nursing patient was given "Data Collection Information Summary for Patients in Inpatient Rehabilitation Facilities with attached "Privacy Act Statement-Health Care Records" upon admission.  

## 2014-05-27 ENCOUNTER — Encounter (HOSPITAL_COMMUNITY): Payer: Medicare Other | Admitting: Occupational Therapy

## 2014-05-27 ENCOUNTER — Inpatient Hospital Stay (HOSPITAL_COMMUNITY): Payer: Medicare Other | Admitting: Occupational Therapy

## 2014-05-27 ENCOUNTER — Inpatient Hospital Stay (HOSPITAL_COMMUNITY): Payer: Medicare Other

## 2014-05-27 DIAGNOSIS — R5381 Other malaise: Secondary | ICD-10-CM

## 2014-05-27 DIAGNOSIS — E669 Obesity, unspecified: Secondary | ICD-10-CM

## 2014-05-27 DIAGNOSIS — I509 Heart failure, unspecified: Secondary | ICD-10-CM

## 2014-05-27 LAB — GLUCOSE, CAPILLARY
Glucose-Capillary: 95 mg/dL (ref 70–99)
Glucose-Capillary: 99 mg/dL (ref 70–99)

## 2014-05-27 MED ORDER — VERAPAMIL HCL ER 180 MG PO TBCR: 180.0000 mg | EXTENDED_RELEASE_TABLET | Freq: Every day | ORAL | Status: DC

## 2014-05-27 MED ORDER — LEVOTHYROXINE SODIUM 25 MCG PO TABS: 25.0000 ug | ORAL_TABLET | Freq: Every day | ORAL | Status: DC

## 2014-05-27 MED ORDER — TRAZODONE HCL 50 MG PO TABS: 50.0000 mg | ORAL_TABLET | Freq: Every evening | ORAL | Status: DC | PRN

## 2014-05-27 MED ORDER — METOPROLOL TARTRATE 25 MG PO TABS: 25.0000 mg | ORAL_TABLET | Freq: Two times a day (BID) | ORAL | Status: DC

## 2014-05-27 MED ORDER — PRAVASTATIN SODIUM 20 MG PO TABS: 20.0000 mg | ORAL_TABLET | Freq: Every day | ORAL | Status: DC

## 2014-05-27 MED ORDER — FERROUS SULFATE 325 (65 FE) MG PO TABS: 325.0000 mg | ORAL_TABLET | Freq: Two times a day (BID) | ORAL | Status: DC

## 2014-05-27 MED ORDER — MEGESTROL ACETATE 40 MG PO TABS: 160.0000 mg | ORAL_TABLET | Freq: Every day | ORAL | Status: DC

## 2014-05-27 MED ORDER — BUPROPION HCL ER (XL) 150 MG PO TB24: 150.0000 mg | ORAL_TABLET | Freq: Every day | ORAL | Status: DC

## 2014-05-27 MED ORDER — CIPROFLOXACIN HCL 500 MG PO TABS: 500.0000 mg | ORAL_TABLET | Freq: Two times a day (BID) | ORAL | Status: DC

## 2014-05-27 MED ORDER — METFORMIN HCL 500 MG PO TABS: 500.0000 mg | ORAL_TABLET | Freq: Two times a day (BID) | ORAL | Status: DC

## 2014-05-27 MED ORDER — FUROSEMIDE 40 MG PO TABS: 40.0000 mg | ORAL_TABLET | Freq: Two times a day (BID) | ORAL | Status: DC

## 2014-05-27 NOTE — Progress Notes (Signed)
Occupational Therapy Discharge Summary  Patient Details  Name: [name] MRN: [id number] Date of Birth: Feb 26, 1971  Today's Date: 05/27/2014  Patient has met 9 of 9 long term goals due to improved activity tolerance, improved balance, postural control, ability to compensate for deficits, improved attention, improved awareness and improved coordination.  Patient to discharge at overall Modified Independent level.  No family present for family education.  Reasons goals not met: n/a, all goals met at this time.   Recommendation: No additional occupational therapy recommended at this time.  Equipment: No equipment provided, patient's mother purchased a shower chair. Patient does not need a BSC at this time.   Reasons for discharge: treatment goals met and discharge from hospital  Patient/family agrees with progress made and goals achieved: Yes  Precautions/Restrictions  Precautions Precautions: Fall Precaution Comments: Cardiac, morbid obesity Restrictions Weight Bearing Restrictions: No  Vital Signs Therapy Vitals Temp: 98.4 F (36.9 C) Temp src: Oral Pulse Rate: 72 Resp: 18 BP: 108/67 mmHg Patient Position (if appropriate): Sitting Oxygen Therapy SpO2: 96 % O2 Device: None (Room air)  ADL - See FIM  Vision/Perception   Cognition Overall Cognitive Status: Within Functional Limits for tasks assessed Arousal/Alertness: Awake/alert Orientation Level: Oriented X4 Attention: Alternating Focused Attention: Appears intact Alternating Attention: Appears intact Memory: Appears intact Awareness: Appears intact Problem Solving: Appears intact Safety/Judgment: Appears intact Comments: and follows mult-step comamnds  Sensation Sensation Light Touch: Appears Intact Proprioception: Appears Intact Coordination Gross Motor Movements are Fluid and Coordinated: Yes Fine Motor Movements are Fluid and Coordinated: Yes  Motor  Motor Motor: Within Functional Limits Motor  - Skilled Clinical Observations: generalized weakness  Mobility  Bed Mobility Bed Mobility: Supine to Sit;Sit to Supine Supine to Sit: 7: Independent Sit to Supine: 7: Independent Transfers Transfers: Sit to Stand;Stand to Sit Sit to Stand: 7: Independent Stand to Sit: 7: Independent   Trunk/Postural Assessment  - See PT discharge   Balance - See PT discharge   Extremity/Trunk Assessment RUE Assessment RUE Assessment: Within Functional Limits LUE Assessment LUE Assessment: Within Functional Limits  See FIM for current functional status  CLAY,PATRICIA 05/27/2014, 3:13 PM

## 2014-05-27 NOTE — Discharge Summary (Signed)
Agree with discharge summary as outlined by Perry Mount, PA-C

## 2014-05-27 NOTE — Progress Notes (Signed)
[place] PHYSICAL MEDICINE & REHABILITATION     PROGRESS NOTE    Subjective/Complaints: No new issues. Tired from therapies yesterday. Very productive day though.  A  review of systems has been performed and if not noted above is otherwise negative.   Objective: Vital Signs: Blood pressure 119/74, pulse 76, temperature 97.8 F (36.6 C), temperature source Oral, resp. rate 18, weight 167.377 kg (369 lb), last menstrual period 05/01/2014, SpO2 98.00%. No results found.  Recent Labs  05/25/14 0646 05/26/14 0620  WBC 8.5 8.3  HGB 8.9* 8.9*  HCT 28.6* 28.2*  PLT 339 340    Recent Labs  05/25/14 0646 05/26/14 0620  NA 143 143  K 3.9 3.8  CL 103 103  GLUCOSE 99 103*  BUN 12 12  CREATININE 0.93 1.03  CALCIUM 8.9 9.0   CBG (last 3)   Recent Labs  05/26/14 1646 05/26/14 2120 05/27/14 0728  GLUCAP 102* 94 95    Wt Readings from Last 3 Encounters:  05/26/14 167.377 kg (369 lb)  05/25/14 171.414 kg (377 lb 14.4 oz)  05/15/14 180.3 kg (397 lb 7.8 oz)    Physical Exam:  Constitutional: She is oriented to person, place, and time.   morbidly obese  .  HENT: oral mucosa pink, moist, dentition normal  Head: Normocephalic.  Eyes: EOM are normal.  Neck: Normal range of motion. Neck supple. No thyromegaly present.  Cardiac: regular rhythm. Systolic murmur. Limbs without edema  Respiratory: Effort normal and breath sounds normal.  GI: Soft. Bowel sounds are normal. She exhibits no distension. There is no tenderness.  Skin: Skin is warm and dry. No breakdown Psychiatric: She has a normal mood and affect.  Neuro: alert and oriented x 3. CN exam normal. Normal DTR'smotor strength is 4+ bilateral deltoids, 5/5 bilateral biceps triceps grip, 3+ bilateral hip flexors limited by panniculus, 4/5 bilateral knee extensors ankle dorsiflexors  Sensory intact to light touch in both lower extremity   Assessment/Plan: 1. Functional deficits secondary to deconditioning after CHF  which require 3+ hours per day of interdisciplinary therapy in a comprehensive inpatient rehab setting. Physiatrist is providing close team supervision and 24 hour management of active medical problems listed below. Physiatrist and rehab team continue to assess barriers to discharge/monitor patient progress toward functional and medical goals. FIM: FIM - Bathing Bathing Steps Patient Completed: Chest;Right Arm;Right lower leg (including foot);Left lower leg (including foot);Left Arm;Abdomen;Front perineal area;Buttocks;Right upper leg;Left upper leg Bathing: 4: Steadying assist  FIM - Upper Body Dressing/Undressing Upper body dressing/undressing steps patient completed: Put head through opening of pull over shirt/dress;Thread/unthread left sleeve of pullover shirt/dress;Thread/unthread right sleeve of pullover shirt/dresss;Pull shirt over trunk Upper body dressing/undressing: 5: Set-up assist to: Obtain clothing/put away FIM - Lower Body Dressing/Undressing Lower body dressing/undressing steps patient completed: Thread/unthread right underwear leg;Thread/unthread left underwear leg;Pull underwear up/down;Thread/unthread right pants leg;Thread/unthread left pants leg;Pull pants up/down;Don/Doff right sock;Don/Doff left sock Lower body dressing/undressing: 4: Steadying Assist  FIM - Toileting Toileting steps completed by patient: Adjust clothing prior to toileting;Performs perineal hygiene;Adjust clothing after toileting Toileting: 7: Independent: No helper, no device     FIM - Control and instrumentation engineer Devices: Walker;Arm rests Bed/Chair Transfer: 5: Bed > Chair or W/C: Supervision (verbal cues/safety issues);5: Chair or W/C > Bed: Supervision (verbal cues/safety issues)  FIM - Locomotion: Wheelchair Distance: 100 Locomotion: Wheelchair: 2: Travels 50 - 149 ft with supervision, cueing or coaxing FIM - Locomotion: Ambulation Locomotion: Ambulation Assistive Devices:  Administrator Ambulation/Gait Assistance:  5: Supervision Locomotion: Ambulation: 2: Travels 50 - 149 ft with supervision/safety issues  Comprehension Comprehension Mode: Auditory Comprehension: 7-Follows complex conversation/direction: With no assist  Expression Expression Mode: Verbal Expression: 7-Expresses complex ideas: With no assist  Social Interaction Social Interaction: 6-Interacts appropriately with others with medication or extra time (anti-anxiety, antidepressant).  Problem Solving Problem Solving: 7-Solves complex problems: Recognizes & self-corrects  Memory Memory: 7-Complete Independence: No helper  Medical Problem List and Plan:  1. Functional deficits secondary to deconditioning after acute exacerbation of chronic diastolic congestive heart failure  2. DVT Prophylaxis/Anticoagulation: SCD.   3. Pain Management: Tylenol as needed.    4. Mood/depression: Wellbutrin 150 mg daily. Provide emotional support  5. Neuropsych: This patient is capable of making decisions on her own behalf.  6. Diabetes mellitus with peripheral neuropathy. Latest hemoglobin A1c 5.7. Glucophage 500 mg twice a day.  -sugars under control at present 7.Severe menorrhagia. Patient transfused 6 units total packed red blood cells after recent admission. Followup with Dr. Elly Modena with OB/GYN. Continue Megace per OB/GYN  8. Anemia. Improved with latest hemoglobin 8.9.   Continue iron supplement.  9. Hypertension/CV: Verapamil 180 mg daily, Lopressor 25 mg twice a day, Lasix 40 mg twice a day.  10. Escherichia coli urinary tract infection. Cipro 500 mg twice a day initiated 05/23/2014.  11. Hypothyroidism. Synthroid  12. MRSA PCR screening positive. Continue contact precautions  LOS (Days) 2 A FACE TO FACE EVALUATION WAS PERFORMED  SWARTZ,ZACHARY T 05/27/2014 8:03 AM

## 2014-05-27 NOTE — Progress Notes (Signed)
Patient discharged home.  Left floor via wheelchair, escorted by nursing staff and friend.  All patient belongings taken with patient.  Patient verbalized understanding of discharge instructions as given by Marlowe Shores, PA.  Appears to be in no immediate distress at this time.  Brita Romp, RN

## 2014-05-27 NOTE — Plan of Care (Signed)
Problem: RH Wheelchair Mobility Goal: LTG Patient will propel w/c in community environment (PT) LTG: Patient will propel wheelchair in community environment, # of feet with assist (PT)  Outcome: Not Applicable Date Met:  11/73/56 Goal not applicable secondary to pt fully independent with ambulation community distances

## 2014-05-27 NOTE — Discharge Instructions (Signed)
Inpatient Rehab Discharge Instructions  Erin Sexton Discharge date and time: No discharge date for patient encounter.   Activities/Precautions/ Functional Status: Activity: activity as tolerated Diet: diabetic diet Wound Care: none needed Functional status:  ___ No restrictions     ___ Walk up steps independently ___ 24/7 supervision/assistance   ___ Walk up steps with assistance ___ Intermittent supervision/assistance  ___ Bathe/dress independently ___ Walk with walker     ___ Bathe/dress with assistance ___ Walk Independently    ___ Shower independently _x__ Walk with assistance    ___ Shower with assistance ___ No alcohol     ___ Return to work/school ________  Special Instructions:    My questions have been answered and I understand these instructions. I will adhere to these goals and the provided educational materials after my discharge from the hospital.  Patient/Caregiver Signature _______________________________ Date __________  Clinician Signature _______________________________________ Date __________  Please bring this form and your medication list with you to all your follow-up doctor's appointments.

## 2014-05-27 NOTE — Progress Notes (Signed)
Physical Therapy Session Note  Patient Details  Name: Erin Sexton MRN: [id number] Date of Birth: 26-Sep-1971  Today's Date: 05/27/2014 Time: 0930-1028 & [id number] Time Calculation (min): 58 min & 45 min  Short Term Goals: Week 1:  PT Short Term Goal 1 (Week 1): = LTGs due to anticipated LOS  Skilled Therapeutic Interventions/Progress Updates:   Pt. Seated in w/c enthusiastic about session; both morning and afternoon sessions. States she's feeling great and is doing much better "suddenly" and is "ready to go home!"  Session 1: Therapeutic Exercise: BLE exercises to tolerance: utilized stairs 11 x 2 up/down, 4 x 3 up/down for increased endurance and strengthening. BUE exercises to tolerance with 1 minute holds for increased strengthening: shoulder extension, shoulder flexion, shoulder shrugs in sitting for energy conservation. Pt. Educated on alternating UE and LE activities for increased training and recovery cycles.  Pt reports no pain; has occasional SOB during activity managed by pre-breathing, standing and seated rest breaks.  Session 2: Therapeutic Exercise: Pt. Performed community+ ambulation for endurance training. Pt. Displays increased scissor-gait pattern with fatigue w/o any LOBs. Pt. Ambulated 250+ with standing rest/recovery break x 2 with decreased SOB symptoms.   Self Care: Pt. Provided education on progression of exercises for home environment with cues on natural progression utilizing time trials, increased velocity, and increased weights/resistance. Pt performed teach-back after therapist demonstrations, and expressed verbal understanding of progression.  Pt. Reports no pain; has decreased SOB with increased activity. Pt. Required less frequent rest/recovery breaks.  Therapy Documentation Precautions:  Precautions Precautions: Fall Precaution Comments: Cardiac, morbid obesity Restrictions Weight Bearing Restrictions: No  See FIM for current functional  status  Therapy/Group: Individual Therapy  Juluis Mire 05/27/2014, 4:01 PM

## 2014-05-27 NOTE — Plan of Care (Signed)
Problem: RH Car Transfers Goal: LTG Patient will perform car transfers with assist (PT) LTG: Patient will perform car transfers with assistance (PT).  Outcome: Completed/Met Date Met:  05/27/14 Pt did not demonstrate to therapy car transfer on simulated car but pt independent with all other transfers and performed with family at D/C independently

## 2014-05-27 NOTE — Discharge Summary (Signed)
Erin Sexton, Erin Sexton NO.:  [id number]  MEDICAL RECORD NO.:  [id number]  LOCATION:  4M10C                        FACILITY:  [facility]  PHYSICIAN:  Erin SextonDATE OF BIRTH:  1971/07/03  DATE OF ADMISSION:  05/25/2014 DATE OF DISCHARGE:  05/27/2014                              DISCHARGE SUMMARY   DISCHARGE DIAGNOSES: 1. Functional deficits secondary to deconditioning after exacerbation     of chronic diastolic heart failure. 2. Sequential compression devices for DVT prophylaxis. 3. Depression. 4. Diabetes mellitus, peripheral neuropathy. 5. Severe menorrhagia. 6. Chronic anemia. 7. Hypertension. 8. Escherichia coli, urinary tract infection. 9. Hypothyroidism. 10.MRSA and PCR screening positive.  HISTORY OF PRESENT ILLNESS:  This is a 43 year old right-handed female with history of asthma, hypertension, severe menorrhagia, morbid obesity, and systolic congestive heart failure, who was independent prior to admission.  Recently admitted with profuse vaginal bleeding and received transfusion of 6 units packed red blood cells.  Discharged to home on May 17, 2014.  The patient readmitted on May 18, 2014, with increasing shortness of breath.  Chest x-ray consistent with congestive heart failure.  It was felt congestive heart failure, likely exacerbated by volume of recent transfusion.  Maintained on Lasix therapy as directed.  Follow up per Cardiology Services.  Mildly elevated troponin felt to be secondary to congestive heart failure.  Not a candidate for invasive workup and managed conservatively.  Urine culture on May 20, 2014, E. coli maintained on Cipro.  Hemoglobin remained stable at 8.4. Contact precautions for MRSA and PCR screening positive.  Physical and occupational therapy ongoing.  The patient was admitted for comprehensive rehab program.  PAST MEDICAL HISTORY:  See discharge diagnoses.  SOCIAL HISTORY:  Lives with family.  FUNCTIONAL  HISTORY:  Prior to admission independent.  Functional status upon admission to rehab services was min assist sit to stand; mod assist supine to sit; min mod assist and activities of daily living.  PHYSICAL EXAMINATION:  VITAL SIGNS:  Blood pressure 125/73, pulse 77, temperature 98.1, respirations 18. GENERAL:  This was an alert female, oriented x3.  Morbidly obese. LUNGS:  Clear to auscultation. CARDIAC:  Regular rate and rhythm. ABDOMEN:  Soft, nontender.  Good bowel sounds. EXTREMITIES:  She was moving all extremities.  REHABILITATION HOSPITAL COURSE:  The patient was admitted to inpatient rehab services with therapies initiated on a 3-hour daily basis consisting of physical therapy, occupational therapy, and rehabilitation nursing.  The following issues were addressed during the patient's rehabilitation stay.  Pertaining to [name]'s deconditioning after acute exacerbation of chronic diastolic congestive heart failure remained stable.  She continued on Lasix therapy.  It was felt exacerbation of congestive heart failure related to recent transfusion due to severe menorrhagia.  Sequential compression device was placed for DVT prophylaxis.  Maintained on Wellbutrin for history of depression with emotional support provided.  She was quite motivated for therapies. She did have a history of diabetes mellitus, peripheral neuropathy. Hemoglobin A1c of 5.7, she remained on Glucophage 500 mg twice daily. Hemoglobin and hematocrit remained stable with latest hemoglobin of 8.9. She would follow up Dr. Elly Modena in relation to the recent menorrhagia. Blood pressures well controlled.  No orthostatic changes.  She was completed a course of Cipro for E. coli urinary tract infection.  No dysuria or hematuria.  She remained on contact precautions for MRSA and PCR screening, remaining afebrile.  The patient received weekly collaborative interdisciplinary team conferences to discuss  estimated length of stay, family teaching, and any barriers to discharge.  She was requiring supervision with wheelchair parts management ambulating extended distances with a rolling walker standby assistance.  Strength and endurance continued to greatly improve.  She was encouraged with her overall progress.  She was able to gather her belongings for activities of daily living.  Some decreased activity tolerance noted.  The patient with extremely good gains during her rehab stay.  Arrangements were made for discharge to home after teaching completed, no further therapies recommended.  DISCHARGE MEDICATIONS:  Lipitor 10 mg p.o. daily, Wellbutrin 150 mg p.o. daily, Cipro 500 mg p.o. b.i.d. x2 more days and stop, ferrous sulfate 325 mg p.o. b.i.d., Flovent inhaler 2 puffs twice daily, Lasix 40 mg p.o. b.i.d., Synthroid 25 mcg p.o. daily, Megace 160 mg p.o. daily, Glucophage 500 mg p.o. b.i.d., metoprolol 25 mg p.o. b.i.d., and verapamil 180 mg p.o. daily.  DIET:  Diabetic diet.  SPECIAL INSTRUCTIONS:  The patient should follow up Dr. Alger Simons at the outpatient rehab service office as needed, Dr. Elly Modena, OB/GYN, and follow up with Jory Sims, NP; Linna Hoff, [phone] on 06/06/2014.     Erin Sexton, P.A.   ______________________________ Erin Sexton, M.D.    DA/MEDQ  D:  05/27/2014  T:  05/27/2014  Job:  [id number]  cc:   Mora Bellman, MD Oren Binet, MD

## 2014-05-27 NOTE — Progress Notes (Signed)
Occupational Therapy Session Notes  Patient Details  Name: Erin Sexton MRN: [id number] Date of Birth: 04/15/1971  Today's Date: 05/27/2014  Skilled Therapeutic Interventions/Progress Updates:   Session #1 [id number] - 36 Minutes Individual Therapy No complaints of pain Patient received supine in bed with RT present. Therapist talked with patient about OT and patient receptive to therapy, patient eager to shower. Patient sat EOB, stood with RW, and ambulated > bathroom for toilet transfer, toileting, then shower stall transfer. UB/LB bathing completed in sit<>stand position using shower chair in walk-in shower. Patient stated her mother recently purchased a shower chair for use in her walk-in shower at home.  After shower, patient ambulated back to room for UB/LB dressing. Patient stood for LB dressing and sat for UB dressing secondary to fatigue/decreased endurance. Patient sat EOB to eat breakfast and therapist discussed d/c planning and patient's medical history. From here, patient ambulated > sink for grooming task of brushing teeth. Patient then ambulated around room without RW for simple house management tasks; patient independent. Patient then ambulated > ADL apartment, sat on bed for rest break, then ambulated back to room (without RW). Patient stated, "I feel like I walk better without the walker", therapist agreed. At end of session, left patient seated in w/c beside bed with all needs within reach.   Session #2 [id number] - 25 Minutes Individual Therapy No complaints of pain Patient found seated in w/c with mother present. Patient eager and excited to go home today. Patient ambulated > ADL apartment for focus on simulated shower stall transfer, BUE strengthening exercises, functional mobility, and overall activity tolerance/endurance. Patient mod I for transfers, independent with BUE HEP, independent with functional ambulation. Therapist also discussed importance of energy conservation  at home. Patient receptive to all information. Patient ambulated back to room and sat in w/c independently. RN made aware that patient is ready to discharge.   Precautions:  Precautions Precautions: Fall Precaution Comments: Cardiac, morbid obesity Restrictions Weight Bearing Restrictions: No  See FIM for current functional status  CLAY,PATRICIA 05/27/2014, 7:30 AM

## 2014-05-27 NOTE — Progress Notes (Signed)
Physical Therapy Discharge Summary  Patient Details  Name: Erin Sexton MRN: [id number] Date of Birth: 1971-06-28  Today's Date: 05/27/2014  Patient has met 7 of 7 long term goals due to improved activity tolerance, improved balance and increased strength.  Patient to discharge at an ambulatory level Independent.   Patient's care partner not needed to provide assistance at discharge secondary to pt returning to PLOF and independence.  Reasons goals not met: All goals met and exceeded  Recommendation:  Patient will not require ongoing skilled PT secondary to pt has returned to PLOF and independence.  Equipment: No equipment provided  Reasons for discharge: treatment goals met and discharge from hospital  Patient/family agrees with progress made and goals achieved: Yes  PT Discharge Precautions/Restrictions Precautions Precautions: Fall Precaution Comments: Cardiac, morbid obesity Restrictions Weight Bearing Restrictions: No Vital Signs Therapy Vitals Temp: 98.4 F (36.9 C) Temp src: Oral Pulse Rate: 72 Resp: 18 BP: 108/67 mmHg Patient Position (if appropriate): Sitting Oxygen Therapy SpO2: 96 % O2 Device: None (Room air) Cognition Overall Cognitive Status: Within Functional Limits for tasks assessed Arousal/Alertness: Awake/alert Orientation Level: Oriented X4 Attention: Alternating Focused Attention: Appears intact Alternating Attention: Appears intact Memory: Appears intact Awareness: Appears intact Problem Solving: Appears intact Safety/Judgment: Appears intact Comments: and follows mult-step comamnds Sensation Sensation Light Touch: Appears Intact Proprioception: Appears Intact Coordination Gross Motor Movements are Fluid and Coordinated: Yes Fine Motor Movements are Fluid and Coordinated: Yes Motor  Motor Motor: Within Functional Limits Motor - Skilled Clinical Observations: generalized weakness  Mobility Bed Mobility Bed Mobility: Supine to  Sit;Sit to Supine Supine to Sit: 7: Independent Sit to Supine: 7: Independent Transfers Transfers: Yes Sit to Stand: 7: Independent Stand to Sit: 7: Independent Locomotion  Ambulation Ambulation: Yes Ambulation/Gait Assistance: 7: Independent Assistive device: None Gait Gait: Yes Gait Pattern: Scissoring (Patient has narrow BOS during ambulation) Stairs / Additional Locomotion Stairs: Yes Stairs Assistance: 6: Modified independent (Device/Increase time) Stair Management Technique: One rail Right Number of Stairs: 22 Height of Stairs: 6   Balance  Independent with all dynamic standing balance Extremity Assessment  RUE Assessment RUE Assessment: Within Functional Limits RUE Strength RUE Overall Strength: Deficits LUE Assessment LUE Assessment: Within Functional Limits LUE Strength LUE Overall Strength Comments: grossly 4+/5 MMT throughout RLE Assessment RLE Assessment: Within Functional Limits LLE Assessment LLE Assessment: Within Functional Limits  See FIM for current functional status  Raylene Everts [facility] 05/27/2014, 4:23 PM

## 2014-05-27 NOTE — Discharge Summary (Signed)
Discharge summary job [id number]

## 2014-05-27 NOTE — IPOC Note (Signed)
Overall Plan of Care [facility]) Patient Details Name: Erin Sexton MRN: [id number] DOB: 1971-10-12  Admitting Diagnosis: DECONDITIONING  Hospital Problems: Active Problems:   Physical deconditioning     Functional Problem List: Nursing    PT Balance;Endurance;Motor;Safety;Other (comment) (ROM)  OT Balance;Endurance;Motor;Safety  SLP    TR         Basic ADL's: OT Grooming;Bathing;Dressing;Toileting     Advanced  ADL's: OT Simple Meal Preparation     Transfers: PT Bed Mobility;Bed to Chair  OT Toilet;Tub/Shower     Locomotion: PT Ambulation;Stairs     Additional Impairments: OT None  SLP        TR      Anticipated Outcomes Item Anticipated Outcome  Self Feeding no goal   Swallowing      Basic self-care  mod I   Toileting  mod I    Bathroom Transfers mod I   Bowel/Bladder     Transfers  Mod I  Locomotion  Mod I  Communication     Cognition     Pain     Safety/Judgment      Therapy Plan: PT Intensity: Minimum of 1-2 x/day ,45 to 90 minutes PT Frequency: 5 out of 7 days PT Duration Estimated Length of Stay: 7-10 days OT Intensity: Minimum of 1-2 x/day, 45 to 90 minutes OT Frequency: 5 out of 7 days OT Duration/Estimated Length of Stay: 7 days         Team Interventions: Nursing Interventions    PT interventions Ambulation/gait training;Pain management;Stair training;Balance/vestibular training;Patient/family education;Therapeutic Activities;Wheelchair propulsion/positioning;Disease Editor, commissioning;Therapeutic Exercise;Functional mobility training;Community reintegration;UE/LE Strength taining/ROM;Discharge planning;Neuromuscular re-education;UE/LE Coordination activities  OT Interventions Balance/vestibular training;Discharge planning;Community reintegration;Patient/family education;DME/adaptive equipment instruction;Therapeutic Activities;Therapeutic Exercise;Psychosocial support;Self Care/advanced ADL  retraining;UE/LE Strength taining/ROM;Functional mobility training;Skin care/wound managment;Disease mangement/prevention  SLP Interventions    TR Interventions    SW/CM Interventions      Team Discharge Planning: Destination: PT-Home ,OT- Home , SLP-  Projected Follow-up: PT-Outpatient PT;24 hour supervision/assistance, OT-  None, SLP-  Projected Equipment Needs: PT-Rolling walker with 5" wheels;Wheelchair (measurements), OT- To be determined, SLP-  Equipment Details: PT-TBD, OT-  Patient/family involved in discharge planning: PT- Patient,  OT-Patient, SLP-   MD ELOS: 3 days Medical Rehab Prognosis:  Excellent Assessment: The patient has been admitted for CIR therapies with the diagnosis of deconditioning. The team will be addressing functional mobility, strength, stamina, balance, safety, adaptive techniques and equipment, self-care, bowel and bladder mgt, patient and caregiver education, CV tolerance. Goals have been set at mod I with self-care and mobility.    Meredith Staggers, MD, FAAPMR      See Team Conference Notes for weekly updates to the plan of care

## 2014-05-28 ENCOUNTER — Encounter (HOSPITAL_COMMUNITY): Payer: Medicare Other | Admitting: Occupational Therapy

## 2014-05-28 ENCOUNTER — Inpatient Hospital Stay (HOSPITAL_COMMUNITY): Payer: Medicare Other | Admitting: Physical Therapy

## 2014-05-30 NOTE — Progress Notes (Signed)
Social Work Patient ID: Joelyn Oms, female   DOB: 1971-10-09, 43 y.o.   MRN: [id number]  LATE ENTRY:  No formal psychosocial assessment completed on this short LOS pt as she denied any concerns about her d/c.  No DME or f/u services recommended as pt was at a completely independent functional level at d/c.  No SW/ CM intervention needed.  HOYLE, LUCY, LCSW

## 2014-06-01 ENCOUNTER — Encounter: Payer: Self-pay | Admitting: Obstetrics & Gynecology

## 2014-06-01 ENCOUNTER — Ambulatory Visit (INDEPENDENT_AMBULATORY_CARE_PROVIDER_SITE_OTHER): Payer: Medicare Other | Admitting: Obstetrics & Gynecology

## 2014-06-01 VITALS — BP 122/60 | Ht 69.0 in | Wt 380.0 lb

## 2014-06-01 DIAGNOSIS — N8501 Benign endometrial hyperplasia: Secondary | ICD-10-CM | POA: Diagnosis not present

## 2014-06-01 DIAGNOSIS — N898 Other specified noninflammatory disorders of vagina: Secondary | ICD-10-CM

## 2014-06-01 DIAGNOSIS — N920 Excessive and frequent menstruation with regular cycle: Secondary | ICD-10-CM | POA: Diagnosis not present

## 2014-06-01 DIAGNOSIS — N921 Excessive and frequent menstruation with irregular cycle: Secondary | ICD-10-CM

## 2014-06-01 DIAGNOSIS — N840 Polyp of corpus uteri: Secondary | ICD-10-CM | POA: Diagnosis not present

## 2014-06-01 LAB — POCT HEMOGLOBIN: Hemoglobin: 11.1 g/dL — AB (ref 12.2–16.2)

## 2014-06-01 NOTE — Progress Notes (Signed)
Patient ID: Olesya Wike, female   DOB: 1971-03-01, 43 y.o.   MRN: [id number]  Blood pressure 122/60, height 5\' 9"  (1.753 m), weight 380 lb (172.367 kg), last menstrual period 05/01/2014.  Referred from [place] after heavy vaginal bleeding and transfused 6 units PRBC  All notes and labs and scans were reviewed  On megestrol 160 mg daily and now amenorrheic No other associated complaints  ROS No burning with urination, frequency or urgency No nausea, vomiting or diarrhea Nor fever chills or other constitutional symptoms  Exam NEFG Vagina pink moist no discharge Cervix nulliparous in appeasrance, no lesions Uterus NSSC by sonogram  Endometial biopsy was performed due to thickened endometrium and heavy vaginal bleeding and morbid obesity  Cervix was grasped and 3 mm pipelle was passed without difficulty 2 passes mad and a moderate amount of tissue was returned and sent to pathology for evaluation  A/P;  Multiple medical problems, morbid obesity  Menorrhagia on megestrol with thickened endometrial stripe adn s/p 6 u prbc  Endometrial biopsy done  Follow up next week to go over biopspy report

## 2014-06-06 ENCOUNTER — Encounter: Payer: Self-pay | Admitting: Adult Health

## 2014-06-06 ENCOUNTER — Ambulatory Visit (INDEPENDENT_AMBULATORY_CARE_PROVIDER_SITE_OTHER): Payer: Medicare Other | Admitting: Adult Health

## 2014-06-06 DIAGNOSIS — I5033 Acute on chronic diastolic (congestive) heart failure: Secondary | ICD-10-CM

## 2014-06-06 DIAGNOSIS — I421 Obstructive hypertrophic cardiomyopathy: Secondary | ICD-10-CM

## 2014-06-06 DIAGNOSIS — I509 Heart failure, unspecified: Secondary | ICD-10-CM

## 2014-06-06 DIAGNOSIS — I10 Essential (primary) hypertension: Secondary | ICD-10-CM

## 2014-06-06 NOTE — Assessment & Plan Note (Signed)
Blood pressure is controlled at this time. We will not make any changes on medication regimen and so after sleep study. We may need to begin to titrate medications down if she begins to wears CPAP regularly as this will affect her blood pressure, bringing it down and therefore she was need less medicine. We will see her in close followup in one month. She is also advised to do her best to avoid salty foods.

## 2014-06-06 NOTE — Progress Notes (Deleted)
Name: Erin Sexton    DOB: 1971-07-18  Age: 43 y.o.  MR#: [id number]       PCP:  Oren Binet, MD      Insurance: Payor: MEDICARE / Plan: MEDICARE PART A AND B / Product Type: *No Product type* /   CC:    Chief Complaint  Patient presents with  . Congestive Heart Failure    Diastolic  . Hypertension    VS Filed Vitals:   06/06/14 1417  BP: 118/58  Pulse: 96  Height: 5\' 9"  (1.753 m)  Weight: 376 lb (170.552 kg)  SpO2: 98%    Weights Current Weight  06/06/14 376 lb (170.552 kg)  06/01/14 380 lb (172.367 kg)  05/26/14 369 lb (167.377 kg)    Blood Pressure  BP Readings from Last 3 Encounters:  06/06/14 118/58  06/01/14 122/60  05/27/14 108/67     Admit date:  (Not on file) Last encounter with RMR:  Visit date not found   Allergy Review of patient's allergies indicates no known allergies.  Current Outpatient Prescriptions  Medication Sig Dispense Refill  . albuterol (PROVENTIL HFA;VENTOLIN HFA) 108 (90 BASE) MCG/ACT inhaler Inhale 2 puffs into the lungs every 6 (six) hours as needed for wheezing or shortness of breath.      [name] buPROPion (WELLBUTRIN XL) 150 MG 24 hr tablet Take 1 tablet (150 mg total) by mouth daily.  30 tablet  0  . ciprofloxacin (CIPRO) 500 MG tablet Take 1 tablet (500 mg total) by mouth 2 (two) times daily.  4 tablet  0  . ferrous sulfate 325 (65 FE) MG tablet Take 1 tablet (325 mg total) by mouth 2 (two) times daily with a meal.  60 tablet  0  . fluticasone (FLOVENT HFA) 110 MCG/ACT inhaler Inhale 2 puffs into the lungs 2 (two) times daily.  1 Inhaler  12  . furosemide (LASIX) 40 MG tablet Take 1 tablet (40 mg total) by mouth 2 (two) times daily.  60 tablet  1  . levothyroxine (SYNTHROID, LEVOTHROID) 25 MCG tablet Take 1 tablet (25 mcg total) by mouth daily before breakfast.  30 tablet  1  . megestrol (MEGACE) 40 MG tablet Take 4 tablets (160 mg total) by mouth daily.  120 tablet  1  . metFORMIN (GLUCOPHAGE) 500 MG tablet Take 1 tablet (500 mg total)  by mouth 2 (two) times daily with a meal.  60 tablet  0  . metoprolol tartrate (LOPRESSOR) 25 MG tablet Take 1 tablet (25 mg total) by mouth 2 (two) times daily.  60 tablet  1  . pravastatin (PRAVACHOL) 20 MG tablet Take 1 tablet (20 mg total) by mouth daily.  30 tablet  1  . verapamil (CALAN-SR) 180 MG CR tablet Take 1 tablet (180 mg total) by mouth daily.  30 tablet  1   No current facility-administered medications for this visit.    Discontinued Meds:   There are no discontinued medications.  Patient Active Problem List   Diagnosis Date Noted  . Menorrhagia 06/01/2014  . Physical deconditioning 05/25/2014  . Catheter-associated urinary tract infection 05/20/2014  . Hypokalemia 05/20/2014  . Acute on chronic diastolic congestive heart failure 05/18/2014  . Diabetes mellitus type 2, noninsulin dependent   . OSA (obstructive sleep apnea) 03/21/2014  . Vaginal bleeding 03/06/2014  . Acute blood loss anemia 03/06/2014  . HOCM (hypertrophic obstructive cardiomyopathy) 03/06/2014  . Morbid obesity 03/02/2014  . Essential hypertension, benign 03/02/2014  . Asthma, chronic 03/02/2014    LABS  Component Value Date/Time   NA 143 05/26/2014 0620   NA 143 05/25/2014 0646   NA 141 05/24/2014 0502   K 3.8 05/26/2014 0620   K 3.9 05/25/2014 0646   K 3.8 05/24/2014 0502   CL 103 05/26/2014 0620   CL 103 05/25/2014 0646   CL 99 05/24/2014 0502   CO2 26 05/26/2014 0620   CO2 28 05/25/2014 0646   CO2 26 05/24/2014 0502   GLUCOSE 103* 05/26/2014 0620   GLUCOSE 99 05/25/2014 0646   GLUCOSE 86 05/24/2014 0502   BUN 12 05/26/2014 0620   BUN 12 05/25/2014 0646   BUN 14 05/24/2014 0502   CREATININE 1.03 05/26/2014 0620   CREATININE 0.93 05/25/2014 0646   CREATININE 1.07 05/24/2014 0502   CALCIUM 9.0 05/26/2014 0620   CALCIUM 8.9 05/25/2014 0646   CALCIUM 8.9 05/24/2014 0502   GFRNONAA 65* 05/26/2014 0620   GFRNONAA 73* 05/25/2014 0646   GFRNONAA 62* 05/24/2014 0502   GFRAA 75* 05/26/2014 0620   GFRAA 85*  05/25/2014 0646   GFRAA 72* 05/24/2014 0502   CMP     Component Value Date/Time   NA 143 05/26/2014 0620   K 3.8 05/26/2014 0620   CL 103 05/26/2014 0620   CO2 26 05/26/2014 0620   GLUCOSE 103* 05/26/2014 0620   BUN 12 05/26/2014 0620   CREATININE 1.03 05/26/2014 0620   CALCIUM 9.0 05/26/2014 0620   PROT 6.4 05/26/2014 0620   ALBUMIN 3.2* 05/26/2014 0620   AST 23 05/26/2014 0620   ALT 29 05/26/2014 0620   ALKPHOS 55 05/26/2014 0620   BILITOT 0.4 05/26/2014 0620   GFRNONAA 65* 05/26/2014 0620   GFRAA 75* 05/26/2014 0620       Component Value Date/Time   WBC 8.3 05/26/2014 0620   WBC 8.5 05/25/2014 0646   WBC 9.4 05/24/2014 0502   HGB 11.1* 06/01/2014 1453   HGB 8.9* 05/26/2014 0620   HGB 8.9* 05/25/2014 0646   HGB 8.4* 05/24/2014 0502   HCT 28.2* 05/26/2014 0620   HCT 28.6* 05/25/2014 0646   HCT 26.8* 05/24/2014 0502   MCV 86.8 05/26/2014 0620   MCV 86.9 05/25/2014 0646   MCV 86.7 05/24/2014 0502    Lipid Panel     Component Value Date/Time   CHOL 124 05/15/2014 0456   TRIG 111 05/15/2014 0456   HDL 25* 05/15/2014 0456   CHOLHDL 5.0 05/15/2014 0456   VLDL 22 05/15/2014 0456   LDLCALC 77 05/15/2014 0456    ABG    Component Value Date/Time   TCO2 21 05/15/2014 0032     No results found for this basename: TSH   BNP (last 3 results)  Recent Labs  03/07/14 0352 03/07/14 0841 05/18/14 1345  PROBNP 306.1* 264.6* 2656.0*   Cardiac Panel (last 3 results) No results found for this basename: CKTOTAL, CKMB, TROPONINI, RELINDX,  in the last 72 hours  Iron/TIBC/Ferritin    Component Value Date/Time   IRON 15* 05/15/2014 0725   TIBC 276 05/15/2014 0725   FERRITIN 9* 05/15/2014 0725     EKG Orders placed during the hospital encounter of 05/18/14  . EKG 12-LEAD  . EKG 12-LEAD  . ED EKG  . ED EKG  . EKG 12-LEAD  . EKG 12-LEAD  . EKG 12-LEAD  . EKG 12-LEAD  . EKG  . EKG 12-LEAD  . EKG 12-LEAD  . EKG  . EKG 12-LEAD  . EKG 12-LEAD     Prior Assessment and Plan Problem List as  of 06/06/2014      Cardiovascular and Mediastinum   Essential hypertension, benign   HOCM (hypertrophic obstructive cardiomyopathy)   Acute on chronic diastolic congestive heart failure     Respiratory   Asthma, chronic   OSA (obstructive sleep apnea)   Last Assessment & Plan   04/27/2014 Office Visit Edited 04/27/2014  3:50 PM by Chesley Mires, MD     She reports snoring, sleep disruption, apnea, and daytime sleepiness.  She has history of HTN with diastolic dysfunction.  She reports prior history of severe sleep apnea.  I am concerned she still has sleep apnea.  She has morbid obesity, and I am concerned she could also have sleep related hypoventilation.  We discussed how sleep apnea can affect various health problems including risks for hypertension, cardiovascular disease, and diabetes.  We also discussed how sleep disruption can increase risks for accident, such as while driving.  Weight loss as a means of improving sleep apnea was also reviewed.  Additional treatment options discussed were CPAP therapy, oral appliance, and surgical intervention.  To further assess will arrange for in lab sleep study.       Endocrine   Diabetes mellitus type 2, noninsulin dependent     Genitourinary   Catheter-associated urinary tract infection     Other   Morbid obesity   Vaginal bleeding   Acute blood loss anemia   Hypokalemia   Physical deconditioning   Menorrhagia       Imaging: US Transvaginal Non-ob  05/15/2014   CLINICAL DATA:  Vaginal bleeding since March 2015.  EXAM: TRANSABDOMINAL AND TRANSVAGINAL ULTRASOUND OF PELVIS  TECHNIQUE: Both transabdominal and transvaginal ultrasound examinations of the pelvis were performed. Transabdominal technique was performed for global imaging of the pelvis including uterus, ovaries, adnexal regions, and pelvic cul-de-sac. It was necessary to proceed with endovaginal exam following the transabdominal exam to visualize the endometrium and ovaries.  COMPARISON:  03/06/2014   FINDINGS: Examination is somewhat limited due to patient body habitus.  Uterus  Measurements: 6.9 x 7.8 x 12.0 cm. No fibroids or other mass visualized.  Endometrium  Thickness: 27 mm (previously measured 15 mm). Thickened a moderately heterogeneous likely due in part to hemorrhagic debris in this patient with moderate vaginal bleeding. Could not exclude an endometrial mass/polyp or hyperplasia.  Right ovary  Not visualized.  Left ovary  Not visualized.  Other findings  There is a large heterogeneous somewhat hyperechoic mass centered over the cervix measuring 6 x 6 x 6.9 cm.  IMPRESSION: Slightly enlarged uterus with moderately thickened heterogeneous endometrium measuring 27 mm which has increased in thickness compared to the prior exam. This is likely due in part to hemorrhagic debris in this patient with moderate vaginal bleeding, although cannot exclude a mass. New large heterogeneous echogenic masslike area centered over the cervix measuring 6 x 6 x 6.9 cm likely a large region of clot. Recommend GYN consultation.  If bleeding remains unresponsive to hormonal or medical therapy, focal lesion work-up with sonohysterogram should be considered. Endometrial biopsy should also be considered in pre-menopausal patients at high risk for endometrial carcinoma. (Ref: Radiological Reasoning: Algorithmic Workup of Abnormal Vaginal Bleeding with Endovaginal Sonography and Sonohysterography. AJR 2008; 601:U93-23)  Nonvisualization of the ovaries.   Electronically Signed   By: Marin Olp M.D.   On: 05/15/2014 07:19   US Pelvis Complete  05/15/2014   CLINICAL DATA:  Vaginal bleeding since March 2015.  EXAM: TRANSABDOMINAL AND TRANSVAGINAL ULTRASOUND OF PELVIS  TECHNIQUE: Both transabdominal  and transvaginal ultrasound examinations of the pelvis were performed. Transabdominal technique was performed for global imaging of the pelvis including uterus, ovaries, adnexal regions, and pelvic cul-de-sac. It was necessary to  proceed with endovaginal exam following the transabdominal exam to visualize the endometrium and ovaries.  COMPARISON:  03/06/2014  FINDINGS: Examination is somewhat limited due to patient body habitus.  Uterus  Measurements: 6.9 x 7.8 x 12.0 cm. No fibroids or other mass visualized.  Endometrium  Thickness: 27 mm (previously measured 15 mm). Thickened a moderately heterogeneous likely due in part to hemorrhagic debris in this patient with moderate vaginal bleeding. Could not exclude an endometrial mass/polyp or hyperplasia.  Right ovary  Not visualized.  Left ovary  Not visualized.  Other findings  There is a large heterogeneous somewhat hyperechoic mass centered over the cervix measuring 6 x 6 x 6.9 cm.  IMPRESSION: Slightly enlarged uterus with moderately thickened heterogeneous endometrium measuring 27 mm which has increased in thickness compared to the prior exam. This is likely due in part to hemorrhagic debris in this patient with moderate vaginal bleeding, although cannot exclude a mass. New large heterogeneous echogenic masslike area centered over the cervix measuring 6 x 6 x 6.9 cm likely a large region of clot. Recommend GYN consultation.  If bleeding remains unresponsive to hormonal or medical therapy, focal lesion work-up with sonohysterogram should be considered. Endometrial biopsy should also be considered in pre-menopausal patients at high risk for endometrial carcinoma. (Ref: Radiological Reasoning: Algorithmic Workup of Abnormal Vaginal Bleeding with Endovaginal Sonography and Sonohysterography. AJR 2008; 941:D40-81)  Nonvisualization of the ovaries.   Electronically Signed   By: Marin Olp M.D.   On: 05/15/2014 07:19   Dg Chest Port 1 View  05/18/2014   CLINICAL DATA:  Chest pain and shortness of breath  EXAM: PORTABLE CHEST - 1 VIEW  COMPARISON:  03/05/2014  FINDINGS: Cardiac shadow is mildly enlarged. Diffuse vascular congestion and parenchymal opacities are noted consistent with  congestive failure. No pneumothorax or sizable effusion is seen.  IMPRESSION: Changes consistent with congestive failure.   Electronically Signed   By: Inez Catalina M.D.   On: 05/18/2014 14:33

## 2014-06-06 NOTE — Assessment & Plan Note (Signed)
She is on a high-protein low-carb diet, is being followed by primary care, and monitor closely. She has lost 30 pounds in the last 2 months. She has cheated on her diet on occasion eating some salty foods. She is committed however, to losing weight and increasing her activity.

## 2014-06-06 NOTE — Assessment & Plan Note (Signed)
She is on verapamil 180 mg daily for heart rate control she is not on a beta blocker due to history of asthma. She is offered no complaints of rapid heart rhythm or frequent palpitations. We will continue close followup.

## 2014-06-06 NOTE — Progress Notes (Signed)
HPI: Erin Sexton, fendley a 43 year old patient of Dr. Pennelope Bracken that we follow for ongoing assessment and management of chronic diastolic CHF, hypertrophic obstructive cardiomyopathy, with history of COPD, diabetes, morbid obesity, and prior history of SVT. The sella patient on consultation during hospitalization in June of 2015 after prior admission for profuse vaginal bleeding receiving blood effusions. 2 days after discharge from that initial admission she was readmitted with CHF. The patient is diuresis with IV Lasix and it was felt that fluid overload was related to multiple transfusions. She was also treated for UTI with Cipro.  Post discharge the patient was admitted to a rehabilitation facility where she underwent physical and occupational rehabilitation. This is related to significant deconditioning after acute exacerbation of diastolic heart failure and anemia. 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 On discharge from physical therapy facility the patient was maintained on Lasix 40 mg twice a day, Lipitor 10 mg daily, metoprolol 25 mg twice a day verapamil 180 mg daily. Other noncardiac medications were also continued  Erin Sexton comes today with complaints of generalized fatigue and lower extremity edema some pressure in her chest. She states that while she was in rehabilitation, regaining her strength, she is doing much better than when she has returned home. She admits to going back to bad eating habits using salt, and eating some foods with salt in it. However she is also on a low-carb high-protein diet, and has lost approximately 30 pounds over the last 2 months. She is being followed by Dr. Anastasio Champion.  She is medically compliant. She is due to have a sleep study in 3 days. She required BiPAP at a hospital and do to morbid obesity it is felt that she may have some OSA, related to [place].  No Known Allergies  Current Outpatient Prescriptions  Medication Sig  Dispense Refill  . albuterol (PROVENTIL HFA;VENTOLIN HFA) 108 (90 BASE) MCG/ACT inhaler Inhale 2 puffs into the lungs every 6 (six) hours as needed for wheezing or shortness of breath.      [name] buPROPion (WELLBUTRIN XL) 150 MG 24 hr tablet Take 1 tablet (150 mg total) by mouth daily.  30 tablet  0  . ciprofloxacin (CIPRO) 500 MG tablet Take 1 tablet (500 mg total) by mouth 2 (two) times daily.  4 tablet  0  . ferrous sulfate 325 (65 FE) MG tablet Take 1 tablet (325 mg total) by mouth 2 (two) times daily with a meal.  60 tablet  0  . fluticasone (FLOVENT HFA) 110 MCG/ACT inhaler Inhale 2 puffs into the lungs 2 (two) times daily.  1 Inhaler  12  . furosemide (LASIX) 40 MG tablet Take 1 tablet (40 mg total) by mouth 2 (two) times daily.  60 tablet  1  . levothyroxine (SYNTHROID, LEVOTHROID) 25 MCG tablet Take 1 tablet (25 mcg total) by mouth daily before breakfast.  30 tablet  1  . megestrol (MEGACE) 40 MG tablet Take 4 tablets (160 mg total) by mouth daily.  120 tablet  1  . metFORMIN (GLUCOPHAGE) 500 MG tablet Take 1 tablet (500 mg total) by mouth 2 (two) times daily with a meal.  60 tablet  0  . metoprolol tartrate (LOPRESSOR) 25 MG tablet Take 1 tablet (25 mg total) by mouth 2 (two) times daily.  60 tablet  1  . pravastatin (PRAVACHOL) 20 MG tablet Take 1 tablet (20 mg total) by mouth daily.  30 tablet  1  . verapamil (CALAN-SR) 180 MG  CR tablet Take 1 tablet (180 mg total) by mouth daily.  30 tablet  1   No current facility-administered medications for this visit.    Past Medical History  Diagnosis Date  . COPD (chronic obstructive pulmonary disease)   . Depression   . Hypertension   . Asthma   . Anxiety   . Anemia   . Blood transfusion without reported diagnosis   . Diabetes mellitus without complication     a. Dx 02/2014.  [name] HOCM (hypertrophic obstructive cardiomyopathy)     a. Dx 23557.  . Vaginal bleeding   . SVT (supraventricular tachycardia)     a. Noted on tele during 02/2014 adm  for symptomatic anemia.  [name] NSVT (nonsustained ventricular tachycardia)     a. Noted on tele during 02/2014 adm for symptomatic anemia.  . Morbid obesity   . Abnormal CT scan     a. 02/2014: evaluated for dyspnea and initially placed on Xarelto for PE because radiologist could not exclude small peripheral pulmonary emboli because of limited contrast. However, after admission for ++vaginal bleeding several days later, it was felt she did NOT have PE - underwent further testing with neg VQ 03/06/14, neg LE duplex.    History reviewed. No pertinent past surgical history.  ROS: Review of systems complete and found to be negative unless listed above  PHYSICAL EXAM BP 118/58  Pulse 96  Ht 5\' 9"  (1.753 m)  Wt 376 lb (170.552 kg)  BMI 55.50 kg/m2  SpO2 98%  LMP 05/01/2014 General: Well developed, well nourished, morbidly obese, in no acute distress Head: Eyes PERRLA, No xanthomas.   Normal cephalic and atramatic  Lungs: Clear bilaterally to auscultation and percussion. Heart: HRRR S1 S2, with 2/6.  Pulses are 2+ & equal.            No carotid bruit. No JVD.  No abdominal bruits. No femoral bruits. Abdomen: Bowel sounds are positive, abdomen soft and non-tender without masses or                  Hernia's noted. Msk:  Back normal, normal gait. Normal strength and tone for age. Extremities: No clubbing, cyanosis or edema.  DP +1 Neuro: Alert and oriented X 3. Psych:  Good affect, responds appropriately    ASSESSMENT AND PLAN

## 2014-06-06 NOTE — Assessment & Plan Note (Signed)
No evidence of significant fluid overload. Her weight is actually 4 pounds less than when she was in the hospital. She will continue current medication regimens and diuretic currently. We will begin to titrate should she lose weight, and blood pressure comes down.

## 2014-06-06 NOTE — Patient Instructions (Signed)
Your physician recommends that you schedule a follow-up appointment in: 1 month      Your physician recommends that you continue on your current medications as directed. Please refer to the Current Medication list given to you today.    Thank you for choosing [facility] !          

## 2014-06-08 ENCOUNTER — Encounter: Payer: Self-pay | Admitting: Obstetrics & Gynecology

## 2014-06-08 ENCOUNTER — Ambulatory Visit (INDEPENDENT_AMBULATORY_CARE_PROVIDER_SITE_OTHER): Payer: Medicare Other | Admitting: Obstetrics & Gynecology

## 2014-06-08 VITALS — BP 120/80 | Wt 380.8 lb

## 2014-06-08 DIAGNOSIS — N8501 Benign endometrial hyperplasia: Secondary | ICD-10-CM

## 2014-06-08 NOTE — Progress Notes (Signed)
Patient ID: Erin Sexton, female   DOB: July 14, 1971, 43 y.o.   MRN: [id number] Blood pressure 120/80, weight 380 lb 12.8 oz (172.73 kg), last menstrual period 05/01/2014.  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

## 2014-06-09 ENCOUNTER — Ambulatory Visit (HOSPITAL_BASED_OUTPATIENT_CLINIC_OR_DEPARTMENT_OTHER): Payer: Medicare Other | Attending: Pulmonary Disease

## 2014-06-09 DIAGNOSIS — I10 Essential (primary) hypertension: Secondary | ICD-10-CM | POA: Diagnosis not present

## 2014-06-09 DIAGNOSIS — E039 Hypothyroidism, unspecified: Secondary | ICD-10-CM | POA: Diagnosis not present

## 2014-06-13 DIAGNOSIS — I509 Heart failure, unspecified: Secondary | ICD-10-CM | POA: Diagnosis not present

## 2014-06-13 DIAGNOSIS — E039 Hypothyroidism, unspecified: Secondary | ICD-10-CM | POA: Diagnosis not present

## 2014-06-13 DIAGNOSIS — E119 Type 2 diabetes mellitus without complications: Secondary | ICD-10-CM | POA: Diagnosis not present

## 2014-06-15 ENCOUNTER — Inpatient Hospital Stay (HOSPITAL_COMMUNITY)
Admission: RE | Admit: 2014-06-15 | Payer: Medicare Other | Source: Intra-hospital | Admitting: Physical Medicine & Rehabilitation

## 2014-06-28 ENCOUNTER — Other Ambulatory Visit (HOSPITAL_COMMUNITY): Payer: Self-pay | Admitting: Internal Medicine

## 2014-06-28 ENCOUNTER — Other Ambulatory Visit (HOSPITAL_COMMUNITY): Payer: Self-pay | Admitting: Family

## 2014-06-28 DIAGNOSIS — Z1231 Encounter for screening mammogram for malignant neoplasm of breast: Secondary | ICD-10-CM

## 2014-06-29 ENCOUNTER — Encounter: Payer: Medicare Other | Admitting: Obstetrics & Gynecology

## 2014-07-04 ENCOUNTER — Ambulatory Visit (HOSPITAL_COMMUNITY)
Admission: RE | Admit: 2014-07-04 | Discharge: 2014-07-04 | Disposition: A | Discharge status: Home / Self Care | Payer: Medicare Other | Source: Ambulatory Visit | Attending: Internal Medicine | Admitting: Internal Medicine

## 2014-07-04 DIAGNOSIS — Z1231 Encounter for screening mammogram for malignant neoplasm of breast: Secondary | ICD-10-CM | POA: Diagnosis not present

## 2014-07-06 ENCOUNTER — Encounter: Payer: Self-pay | Admitting: Obstetrics & Gynecology

## 2014-07-07 ENCOUNTER — Ambulatory Visit (INDEPENDENT_AMBULATORY_CARE_PROVIDER_SITE_OTHER): Payer: Medicare Other | Admitting: Adult Health

## 2014-07-07 ENCOUNTER — Encounter: Payer: Self-pay | Admitting: Adult Health

## 2014-07-07 VITALS — BP 102/82 | HR 78 | Ht 69.0 in | Wt 368.0 lb

## 2014-07-07 DIAGNOSIS — I421 Obstructive hypertrophic cardiomyopathy: Secondary | ICD-10-CM

## 2014-07-07 DIAGNOSIS — I10 Essential (primary) hypertension: Secondary | ICD-10-CM | POA: Diagnosis not present

## 2014-07-07 NOTE — Progress Notes (Deleted)
Name: Erin Sexton    DOB: August 11, 1971  Age: 43 y.o.  MR#: [id number]       PCP:  Oren Binet, MD      Insurance: Payor: MEDICARE / Plan: MEDICARE PART A AND B / Product Type: *No Product type* /   CC:    Chief Complaint  Patient presents with  . Congestive Heart Failure  . Cardiomyopathy    VS Filed Vitals:   07/07/14 1359  BP: 102/82  Pulse: 78  Height: 5\' 9"  (1.753 m)  Weight: 368 lb (166.924 kg)  SpO2: 97%    Weights Current Weight  07/07/14 368 lb (166.924 kg)  06/08/14 380 lb 12.8 oz (172.73 kg)  06/06/14 376 lb (170.552 kg)    Blood Pressure  BP Readings from Last 3 Encounters:  07/07/14 102/82  06/08/14 120/80  06/06/14 118/58     Admit date:  (Not on file) Last encounter with RMR:  06/06/2014   Allergy Review of patient's allergies indicates no known allergies.  Current Outpatient Prescriptions  Medication Sig Dispense Refill  . albuterol (PROVENTIL HFA;VENTOLIN HFA) 108 (90 BASE) MCG/ACT inhaler Inhale 2 puffs into the lungs every 6 (six) hours as needed for wheezing or shortness of breath.      [name] buPROPion (WELLBUTRIN XL) 150 MG 24 hr tablet Take 1 tablet (150 mg total) by mouth daily.  30 tablet  0  . ciprofloxacin (CIPRO) 500 MG tablet Take 1 tablet (500 mg total) by mouth 2 (two) times daily.  4 tablet  0  . ferrous sulfate 325 (65 FE) MG tablet Take 1 tablet (325 mg total) by mouth 2 (two) times daily with a meal.  60 tablet  0  . fluticasone (FLOVENT HFA) 110 MCG/ACT inhaler Inhale 2 puffs into the lungs 2 (two) times daily.  1 Inhaler  12  . furosemide (LASIX) 40 MG tablet Take 1 tablet (40 mg total) by mouth 2 (two) times daily.  60 tablet  1  . levothyroxine (SYNTHROID, LEVOTHROID) 50 MCG tablet Take 50 mcg by mouth daily before breakfast.      . megestrol (MEGACE) 40 MG tablet Take 4 tablets (160 mg total) by mouth daily.  120 tablet  1  . metFORMIN (GLUCOPHAGE) 500 MG tablet Take 1 tablet (500 mg total) by mouth 2 (two) times daily with a meal.   60 tablet  0  . metoprolol tartrate (LOPRESSOR) 25 MG tablet Take 1 tablet (25 mg total) by mouth 2 (two) times daily.  60 tablet  1  . pravastatin (PRAVACHOL) 20 MG tablet Take 1 tablet (20 mg total) by mouth daily.  30 tablet  1  . verapamil (CALAN-SR) 180 MG CR tablet Take 1 tablet (180 mg total) by mouth daily.  30 tablet  1   No current facility-administered medications for this visit.    Discontinued Meds:    Medications Discontinued During This Encounter  Medication Reason  . levothyroxine (SYNTHROID, LEVOTHROID) 25 MCG tablet Error    Patient Active Problem List   Diagnosis Date Noted  . Complex endometrial hyperplasia without atypia 06/08/2014  . Menorrhagia 06/01/2014  . Physical deconditioning 05/25/2014  . Catheter-associated urinary tract infection 05/20/2014  . Hypokalemia 05/20/2014  . Acute on chronic diastolic congestive heart failure 05/18/2014  . Diabetes mellitus type 2, noninsulin dependent   . OSA (obstructive sleep apnea) 03/21/2014  . Vaginal bleeding 03/06/2014  . Acute blood loss anemia 03/06/2014  . HOCM (hypertrophic obstructive cardiomyopathy) 03/06/2014  . Morbid obesity 03/02/2014  .  Essential hypertension, benign 03/02/2014  . Asthma, chronic 03/02/2014    LABS    Component Value Date/Time   NA 143 05/26/2014 0620   NA 143 05/25/2014 0646   NA 141 05/24/2014 0502   K 3.8 05/26/2014 0620   K 3.9 05/25/2014 0646   K 3.8 05/24/2014 0502   CL 103 05/26/2014 0620   CL 103 05/25/2014 0646   CL 99 05/24/2014 0502   CO2 26 05/26/2014 0620   CO2 28 05/25/2014 0646   CO2 26 05/24/2014 0502   GLUCOSE 103* 05/26/2014 0620   GLUCOSE 99 05/25/2014 0646   GLUCOSE 86 05/24/2014 0502   BUN 12 05/26/2014 0620   BUN 12 05/25/2014 0646   BUN 14 05/24/2014 0502   CREATININE 1.03 05/26/2014 0620   CREATININE 0.93 05/25/2014 0646   CREATININE 1.07 05/24/2014 0502   CALCIUM 9.0 05/26/2014 0620   CALCIUM 8.9 05/25/2014 0646   CALCIUM 8.9 05/24/2014 0502   GFRNONAA 65*  05/26/2014 0620   GFRNONAA 73* 05/25/2014 0646   GFRNONAA 62* 05/24/2014 0502   GFRAA 75* 05/26/2014 0620   GFRAA 85* 05/25/2014 0646   GFRAA 72* 05/24/2014 0502   CMP     Component Value Date/Time   NA 143 05/26/2014 0620   K 3.8 05/26/2014 0620   CL 103 05/26/2014 0620   CO2 26 05/26/2014 0620   GLUCOSE 103* 05/26/2014 0620   BUN 12 05/26/2014 0620   CREATININE 1.03 05/26/2014 0620   CALCIUM 9.0 05/26/2014 0620   PROT 6.4 05/26/2014 0620   ALBUMIN 3.2* 05/26/2014 0620   AST 23 05/26/2014 0620   ALT 29 05/26/2014 0620   ALKPHOS 55 05/26/2014 0620   BILITOT 0.4 05/26/2014 0620   GFRNONAA 65* 05/26/2014 0620   GFRAA 75* 05/26/2014 0620       Component Value Date/Time   WBC 8.3 05/26/2014 0620   WBC 8.5 05/25/2014 0646   WBC 9.4 05/24/2014 0502   HGB 11.1* 06/01/2014 1453   HGB 8.9* 05/26/2014 0620   HGB 8.9* 05/25/2014 0646   HGB 8.4* 05/24/2014 0502   HCT 28.2* 05/26/2014 0620   HCT 28.6* 05/25/2014 0646   HCT 26.8* 05/24/2014 0502   MCV 86.8 05/26/2014 0620   MCV 86.9 05/25/2014 0646   MCV 86.7 05/24/2014 0502    Lipid Panel     Component Value Date/Time   CHOL 124 05/15/2014 0456   TRIG 111 05/15/2014 0456   HDL 25* 05/15/2014 0456   CHOLHDL 5.0 05/15/2014 0456   VLDL 22 05/15/2014 0456   LDLCALC 77 05/15/2014 0456    ABG    Component Value Date/Time   TCO2 21 05/15/2014 0032     No results found for this basename: TSH   BNP (last 3 results)  Recent Labs  03/07/14 0352 03/07/14 0841 05/18/14 1345  PROBNP 306.1* 264.6* 2656.0*   Cardiac Panel (last 3 results) No results found for this basename: CKTOTAL, CKMB, TROPONINI, RELINDX,  in the last 72 hours  Iron/TIBC/Ferritin/ %Sat    Component Value Date/Time   IRON 15* 05/15/2014 0725   TIBC 276 05/15/2014 0725   FERRITIN 9* 05/15/2014 0725   IRONPCTSAT 5* 05/15/2014 0725     EKG Orders placed during the hospital encounter of 05/18/14  . EKG 12-LEAD  . EKG 12-LEAD  . ED EKG  . ED EKG  . EKG 12-LEAD  . EKG 12-LEAD  . EKG 12-LEAD  . EKG  12-LEAD  . EKG  . EKG 12-LEAD  .  EKG 12-LEAD  . EKG  . EKG 12-LEAD  . EKG 12-LEAD  . EKG     Prior Assessment and Plan Problem List as of 07/07/2014     Cardiovascular and Mediastinum   Essential hypertension, benign   Last Assessment & Plan   06/06/2014 Office Visit Written 06/06/2014  4:32 PM by Lendon Colonel, NP     Blood pressure is controlled at this time. We will not make any changes on medication regimen and so after sleep study. We may need to begin to titrate medications down if she begins to wears CPAP regularly as this will affect her blood pressure, bringing it down and therefore she was need less medicine. We will see her in close followup in one month. She is also advised to do her best to avoid salty foods.    HOCM (hypertrophic obstructive cardiomyopathy)   Last Assessment & Plan   06/06/2014 Office Visit Written 06/06/2014  4:34 PM by Lendon Colonel, NP     She is on verapamil 180 mg daily for heart rate control she is not on a beta blocker due to history of asthma. She is offered no complaints of rapid heart rhythm or frequent palpitations. We will continue close followup.    Acute on chronic diastolic congestive heart failure   Last Assessment & Plan   06/06/2014 Office Visit Written 06/06/2014  4:33 PM by Lendon Colonel, NP     No evidence of significant fluid overload. Her weight is actually 4 pounds less than when she was in the hospital. She will continue current medication regimens and diuretic currently. We will begin to titrate should she lose weight, and blood pressure comes down.      Respiratory   Asthma, chronic   OSA (obstructive sleep apnea)   Last Assessment & Plan   04/27/2014 Office Visit Edited 04/27/2014  3:50 PM by Chesley Mires, MD     She reports snoring, sleep disruption, apnea, and daytime sleepiness.  She has history of HTN with diastolic dysfunction.  She reports prior history of severe sleep apnea.  I am concerned she still has sleep  apnea.  She has morbid obesity, and I am concerned she could also have sleep related hypoventilation.  We discussed how sleep apnea can affect various health problems including risks for hypertension, cardiovascular disease, and diabetes.  We also discussed how sleep disruption can increase risks for accident, such as while driving.  Weight loss as a means of improving sleep apnea was also reviewed.  Additional treatment options discussed were CPAP therapy, oral appliance, and surgical intervention.  To further assess will arrange for in lab sleep study.       Endocrine   Diabetes mellitus type 2, noninsulin dependent     Genitourinary   Catheter-associated urinary tract infection     Other   Morbid obesity   Last Assessment & Plan   06/06/2014 Office Visit Written 06/06/2014  4:32 PM by Lendon Colonel, NP     She is on a high-protein low-carb diet, is being followed by primary care, and monitor closely. She has lost 30 pounds in the last 2 months. She has cheated on her diet on occasion eating some salty foods. She is committed however, to losing weight and increasing her activity.    Vaginal bleeding   Acute blood loss anemia   Hypokalemia   Physical deconditioning   Menorrhagia   Complex endometrial hyperplasia without atypia  Imaging: No results found.

## 2014-07-07 NOTE — Assessment & Plan Note (Signed)
Heart rate  and blood pressure are well controlled currently on verapamil 180 mg daily, and metoprolol 25 mg twice a day. Blood pressure was low on evaluation her triage, I rechecked it it was found to be 112/70. With weight loss, may need to start decreasing the verapamil dose. She is losing approximately 10 pounds a month. She will have close followup concerning her blood pressure and symptoms associated with HOCM. We will keep her on current medication regimen at this time without changes

## 2014-07-07 NOTE — Assessment & Plan Note (Signed)
As stated, blood pressure is low on initial encounter in triage, a repeat blood pressure check in the clinic room revealed a blood pressure of 112/70. With weight loss we may need to adjust her medications downward to prevent hypotension. She will have close followup and may need to decrease her verapamil from 180 mg daily to 120 mg daily to prevent hypotension, and low cardiac output.

## 2014-07-07 NOTE — Progress Notes (Signed)
HPI: Erin Sexton is a 43 year old patient of Dr. Bronson Ing we are seeing for ongoing assessment and management of chronic diastolic heart failure, hypertrophic obstructive cardiomyopathy, with history of COPD, diabetes, morbid obesity, and remote history of SVT. The patient was last seen in the office in June of 2015 after admission to the hospital for CHF exacerbation and anemia. On followup she was to have a sleep study and is followed by Dr. Anastasio Champion, PCP .  On followup visit her blood pressure is well-controlled, there is no evidence of fluid overload, in the setting of hypertrophic obstructive cardiomyopathy she remains on verapamil with good heart rate control, and is not a candidate for beta blocker in the setting of asthma.  The patient is unaware loss diet, and has lost another 12 pounds since being seen last. She is feeling better breathing better and is excited about her weight loss. She denies palpitations, racing heart rate, fatigue, or shortness of breath. She has not encountered any fluid retention.  No Known Allergies  Current Outpatient Prescriptions  Medication Sig Dispense Refill  . albuterol (PROVENTIL HFA;VENTOLIN HFA) 108 (90 BASE) MCG/ACT inhaler Inhale 2 puffs into the lungs every 6 (six) hours as needed for wheezing or shortness of breath.      [name] buPROPion (WELLBUTRIN XL) 150 MG 24 hr tablet Take 1 tablet (150 mg total) by mouth daily.  30 tablet  0  . ciprofloxacin (CIPRO) 500 MG tablet Take 1 tablet (500 mg total) by mouth 2 (two) times daily.  4 tablet  0  . ferrous sulfate 325 (65 FE) MG tablet Take 1 tablet (325 mg total) by mouth 2 (two) times daily with a meal.  60 tablet  0  . fluticasone (FLOVENT HFA) 110 MCG/ACT inhaler Inhale 2 puffs into the lungs 2 (two) times daily.  1 Inhaler  12  . furosemide (LASIX) 40 MG tablet Take 1 tablet (40 mg total) by mouth 2 (two) times daily.  60 tablet  1  . levothyroxine (SYNTHROID, LEVOTHROID) 50 MCG tablet Take 50 mcg  by mouth daily before breakfast.      . megestrol (MEGACE) 40 MG tablet Take 4 tablets (160 mg total) by mouth daily.  120 tablet  1  . metFORMIN (GLUCOPHAGE) 500 MG tablet Take 1 tablet (500 mg total) by mouth 2 (two) times daily with a meal.  60 tablet  0  . metoprolol tartrate (LOPRESSOR) 25 MG tablet Take 1 tablet (25 mg total) by mouth 2 (two) times daily.  60 tablet  1  . pravastatin (PRAVACHOL) 20 MG tablet Take 1 tablet (20 mg total) by mouth daily.  30 tablet  1  . verapamil (CALAN-SR) 180 MG CR tablet Take 1 tablet (180 mg total) by mouth daily.  30 tablet  1   No current facility-administered medications for this visit.    Past Medical History  Diagnosis Date  . COPD (chronic obstructive pulmonary disease)   . Depression   . Hypertension   . Asthma   . Anxiety   . Anemia   . Blood transfusion without reported diagnosis   . Diabetes mellitus without complication     a. Dx 02/2014.  [name] HOCM (hypertrophic obstructive cardiomyopathy)     a. Dx 29937.  . Vaginal bleeding   . SVT (supraventricular tachycardia)     a. Noted on tele during 02/2014 adm for symptomatic anemia.  [name] NSVT (nonsustained ventricular tachycardia)     a. Noted on tele during 02/2014 adm  for symptomatic anemia.  . Morbid obesity   . Abnormal CT scan     a. 02/2014: evaluated for dyspnea and initially placed on Xarelto for PE because radiologist could not exclude small peripheral pulmonary emboli because of limited contrast. However, after admission for ++vaginal bleeding several days later, it was felt she did NOT have PE - underwent further testing with neg VQ 03/06/14, neg LE duplex.    History reviewed. No pertinent past surgical history.  ROS:  Review of systems complete and found to be negative unless listed above  PHYSICAL EXAM BP 102/82  Pulse 78  Ht 5\' 9"  (1.753 m)  Wt 368 lb (166.924 kg)  BMI 54.32 kg/m2  SpO2 97%  LMP 06/04/2014 General: Well developed, well nourished, in no acute distress,  obese Head: Eyes PERRLA, No xanthomas.   Normal cephalic and atramatic  Lungs: Clear bilaterally to auscultation and percussion. Heart: HRRR S1 S2, without MRG. Distant heart sounds  Pulses are 2+ & equal.            No carotid bruit. No JVD.  No abdominal bruits. No femoral bruits. Abdomen: Bowel sounds are positive, abdomen soft and non-tender without masses or                  Hernia's noted. Msk:  Back normal, normal gait. Normal strength and tone for age. Extremities: No clubbing, cyanosis or edema.  DP +1 Neuro: Alert and oriented X 3. Psych:  Good affect, responds appropriately   ASSESSMENT AND PLAN

## 2014-07-07 NOTE — Assessment & Plan Note (Signed)
She is losing approximately 10 pounds a month, walking a mile a day, and restricting calories. She is followed by her primary care physician, with frequent appointment to check her status.

## 2014-07-07 NOTE — Patient Instructions (Signed)
Your physician recommends that you schedule a follow-up appointment in: 1 months   Your physician recommends that you continue on your current medications as directed. Please refer to the Current Medication list given to you today.

## 2014-07-25 ENCOUNTER — Other Ambulatory Visit: Payer: Self-pay | Admitting: Obstetrics and Gynecology

## 2014-07-25 ENCOUNTER — Emergency Department (HOSPITAL_COMMUNITY): Payer: Medicare Other

## 2014-07-25 ENCOUNTER — Encounter (HOSPITAL_COMMUNITY): Payer: Self-pay | Admitting: Emergency Medicine

## 2014-07-25 ENCOUNTER — Emergency Department (HOSPITAL_COMMUNITY)
Admission: EM | Admit: 2014-07-25 | Discharge: 2014-07-26 | Disposition: A | Discharge status: Home / Self Care | Payer: Medicare Other | Attending: Emergency Medicine | Admitting: Emergency Medicine

## 2014-07-25 DIAGNOSIS — F411 Generalized anxiety disorder: Secondary | ICD-10-CM | POA: Insufficient documentation

## 2014-07-25 DIAGNOSIS — F3289 Other specified depressive episodes: Secondary | ICD-10-CM | POA: Insufficient documentation

## 2014-07-25 DIAGNOSIS — R079 Chest pain, unspecified: Secondary | ICD-10-CM | POA: Diagnosis not present

## 2014-07-25 DIAGNOSIS — D649 Anemia, unspecified: Secondary | ICD-10-CM | POA: Insufficient documentation

## 2014-07-25 DIAGNOSIS — E119 Type 2 diabetes mellitus without complications: Secondary | ICD-10-CM | POA: Diagnosis not present

## 2014-07-25 DIAGNOSIS — Z8742 Personal history of other diseases of the female genital tract: Secondary | ICD-10-CM | POA: Insufficient documentation

## 2014-07-25 DIAGNOSIS — J45901 Unspecified asthma with (acute) exacerbation: Secondary | ICD-10-CM

## 2014-07-25 DIAGNOSIS — F329 Major depressive disorder, single episode, unspecified: Secondary | ICD-10-CM | POA: Insufficient documentation

## 2014-07-25 DIAGNOSIS — J441 Chronic obstructive pulmonary disease with (acute) exacerbation: Secondary | ICD-10-CM | POA: Insufficient documentation

## 2014-07-25 DIAGNOSIS — R0789 Other chest pain: Secondary | ICD-10-CM | POA: Diagnosis not present

## 2014-07-25 DIAGNOSIS — I10 Essential (primary) hypertension: Secondary | ICD-10-CM | POA: Insufficient documentation

## 2014-07-25 DIAGNOSIS — IMO0002 Reserved for concepts with insufficient information to code with codable children: Secondary | ICD-10-CM | POA: Insufficient documentation

## 2014-07-25 DIAGNOSIS — Z792 Long term (current) use of antibiotics: Secondary | ICD-10-CM | POA: Insufficient documentation

## 2014-07-25 DIAGNOSIS — E876 Hypokalemia: Secondary | ICD-10-CM

## 2014-07-25 DIAGNOSIS — Z79899 Other long term (current) drug therapy: Secondary | ICD-10-CM | POA: Diagnosis not present

## 2014-07-25 DIAGNOSIS — M7989 Other specified soft tissue disorders: Secondary | ICD-10-CM | POA: Diagnosis not present

## 2014-07-25 DIAGNOSIS — R739 Hyperglycemia, unspecified: Secondary | ICD-10-CM

## 2014-07-25 DIAGNOSIS — R0602 Shortness of breath: Secondary | ICD-10-CM | POA: Diagnosis not present

## 2014-07-25 LAB — CBC WITH DIFFERENTIAL/PLATELET
Basophils Absolute: 0 10*3/uL (ref 0.0–0.1)
Basophils Relative: 0 % (ref 0–1)
Eosinophils Absolute: 0.2 10*3/uL (ref 0.0–0.7)
Eosinophils Relative: 2 % (ref 0–5)
HCT: 39.8 % (ref 36.0–46.0)
Hemoglobin: 13.3 g/dL (ref 12.0–15.0)
LYMPHS ABS: 1.9 10*3/uL (ref 0.7–4.0)
LYMPHS PCT: 18 % (ref 12–46)
MCH: 28.4 pg (ref 26.0–34.0)
MCHC: 33.4 g/dL (ref 30.0–36.0)
MCV: 84.9 fL (ref 78.0–100.0)
MONOS PCT: 4 % (ref 3–12)
Monocytes Absolute: 0.4 10*3/uL (ref 0.1–1.0)
NEUTROS ABS: 7.9 10*3/uL — AB (ref 1.7–7.7)
NEUTROS PCT: 76 % (ref 43–77)
PLATELETS: 261 10*3/uL (ref 150–400)
RBC: 4.69 MIL/uL (ref 3.87–5.11)
RDW: 13.5 % (ref 11.5–15.5)
WBC: 10.4 10*3/uL (ref 4.0–10.5)

## 2014-07-25 LAB — COMPREHENSIVE METABOLIC PANEL
ALK PHOS: 94 U/L (ref 39–117)
ALT: 17 U/L (ref 0–35)
ANION GAP: 16 — AB (ref 5–15)
AST: 18 U/L (ref 0–37)
Albumin: 3.5 g/dL (ref 3.5–5.2)
BILIRUBIN TOTAL: 0.3 mg/dL (ref 0.3–1.2)
BUN: 15 mg/dL (ref 6–23)
CHLORIDE: 101 meq/L (ref 96–112)
CO2: 25 meq/L (ref 19–32)
Calcium: 9.1 mg/dL (ref 8.4–10.5)
Creatinine, Ser: 0.84 mg/dL (ref 0.50–1.10)
GFR, EST NON AFRICAN AMERICAN: 83 mL/min — AB (ref >90)
GLUCOSE: 323 mg/dL — AB (ref 70–99)
POTASSIUM: 3.2 meq/L — AB (ref 3.7–5.3)
SODIUM: 142 meq/L (ref 137–147)
Total Protein: 7.2 g/dL (ref 6.0–8.3)

## 2014-07-25 LAB — TROPONIN I: Troponin I: <0.3 ng/mL (ref <0.30)

## 2014-07-25 LAB — PRO B NATRIURETIC PEPTIDE: Pro B Natriuretic peptide (BNP): 234.6 pg/mL — ABNORMAL HIGH (ref 0–125)

## 2014-07-25 LAB — D-DIMER, QUANTITATIVE (NOT AT ARMC): D-Dimer, Quant: 1.87 ug/mL-FEU — ABNORMAL HIGH (ref 0.00–0.48)

## 2014-07-25 MED ORDER — SODIUM CHLORIDE 0.9 % IV BOLUS (SEPSIS)
500.0000 mL | Freq: Once | INTRAVENOUS | Status: AC
  Administered 2014-07-26: 1000 mL via INTRAVENOUS

## 2014-07-25 MED ORDER — ALBUTEROL SULFATE (2.5 MG/3ML) 0.083% IN NEBU
2.5000 mg | INHALATION_SOLUTION | Freq: Once | RESPIRATORY_TRACT | Status: AC
Start: 2014-07-25 — End: 2014-07-25
  Administered 2014-07-25: 2.5 mg via RESPIRATORY_TRACT
  Filled 2014-07-25: qty 3

## 2014-07-25 MED ORDER — IPRATROPIUM-ALBUTEROL 0.5-2.5 (3) MG/3ML IN SOLN
3.0000 mL | Freq: Once | RESPIRATORY_TRACT | Status: AC
  Administered 2014-07-25: 3 mL via RESPIRATORY_TRACT
  Filled 2014-07-25: qty 3

## 2014-07-25 MED ORDER — IOHEXOL 350 MG/ML SOLN
100.0000 mL | Freq: Once | INTRAVENOUS | Status: AC | PRN
  Administered 2014-07-25: 100 mL via INTRAVENOUS

## 2014-07-25 MED ORDER — ASPIRIN 81 MG PO CHEW
324.0000 mg | CHEWABLE_TABLET | Freq: Once | ORAL | Status: AC
  Administered 2014-07-25: 324 mg via ORAL
  Filled 2014-07-25: qty 4

## 2014-07-25 MED ORDER — NITROGLYCERIN 0.4 MG SL SUBL: 0.4000 mg | SUBLINGUAL_TABLET | SUBLINGUAL | Status: DC | PRN

## 2014-07-25 MED ORDER — MORPHINE SULFATE 4 MG/ML IJ SOLN: 4.0000 mg | Freq: Once | INTRAMUSCULAR | Status: DC

## 2014-07-25 NOTE — ED Notes (Signed)
Having chest pain for one hour.  Took two breathing tx without relief.

## 2014-07-25 NOTE — ED Provider Notes (Signed)
CSN: [id number]     Arrival date & time 07/25/14  2159 History   First MD Initiated Contact with Patient 07/25/14 2226     Chief Complaint  Patient presents with  . Chest Pain  . Shortness of Breath     (Consider location/radiation/quality/duration/timing/severity/associated sxs/prior Treatment) HPI  Erin Sexton is a 43 y.o. female presenting with midsternal chest pressure without shortness of breath starting one hour before arriving here (approximately 9 pm) and occurred without exertion.  She has a history of CHF and hypertrophic cardiomyopathy which was newly diagnosed during an admission March 2015.  She has had increased fluid in her ankles the past several days and is concerned for possible CHF exacerbation.  She reports wheezing prior to arrival (also has asthma) and took 2 nebulizer breathing treatments with albuterol which did not relieve her symptoms.  She takes lasix 40 mg bid and has not missed any doses.  She denies fevers or chills, nausea, vomiting, diaphoresis. She sleeps upright in a recliner chair which she has done for years, she denies any worsening orthopnea.    Past Medical History  Diagnosis Date  . COPD (chronic obstructive pulmonary disease)   . Depression   . Hypertension   . Asthma   . Anxiety   . Anemia   . Blood transfusion without reported diagnosis   . Diabetes mellitus without complication     a. Dx 02/2014.  [name] HOCM (hypertrophic obstructive cardiomyopathy)     a. Dx 89381.  . Vaginal bleeding   . SVT (supraventricular tachycardia)     a. Noted on tele during 02/2014 adm for symptomatic anemia.  [name] NSVT (nonsustained ventricular tachycardia)     a. Noted on tele during 02/2014 adm for symptomatic anemia.  . Morbid obesity   . Abnormal CT scan     a. 02/2014: evaluated for dyspnea and initially placed on Xarelto for PE because radiologist could not exclude small peripheral pulmonary emboli because of limited contrast. However, after admission for  ++vaginal bleeding several days later, it was felt she did NOT have PE - underwent further testing with neg VQ 03/06/14, neg LE duplex.   History reviewed. No pertinent past surgical history. Family History  Problem Relation Age of Onset  . CAD Neg Hx     No family history of EARLY CAD   History  Substance Use Topics  . Smoking status: Never Smoker   . Smokeless tobacco: Never Used  . Alcohol Use: No   OB History   Grav Para Term Preterm Abortions TAB SAB Ect Mult Living            0     Review of Systems  Constitutional: Negative for fever and chills.  HENT: Negative for congestion and sore throat.   Eyes: Negative.   Respiratory: Positive for chest tightness. Negative for cough, shortness of breath and wheezing.   Cardiovascular: Positive for chest pain and leg swelling. Negative for palpitations.  Gastrointestinal: Negative for nausea, vomiting and abdominal pain.  Genitourinary: Negative.   Musculoskeletal: Negative for arthralgias, joint swelling and neck pain.  Skin: Negative.  Negative for rash and wound.  Neurological: Negative for dizziness, weakness, light-headedness, numbness and headaches.  Psychiatric/Behavioral: Negative.       Allergies  Review of patient's allergies indicates no known allergies.  Home Medications   Prior to Admission medications   Medication Sig Start Date End Date Taking? Authorizing Provider  albuterol (PROVENTIL HFA;VENTOLIN HFA) 108 (90 BASE) MCG/ACT inhaler Inhale  2 puffs into the lungs every 6 (six) hours as needed for wheezing or shortness of breath.    Historical Provider, MD  buPROPion (WELLBUTRIN XL) 150 MG 24 hr tablet Take 1 tablet (150 mg total) by mouth daily. 05/27/14   Lavon Paganini Angiulli, PA-C  ciprofloxacin (CIPRO) 500 MG tablet Take 1 tablet (500 mg total) by mouth 2 (two) times daily. 05/27/14   Lavon Paganini Angiulli, PA-C  ferrous sulfate 325 (65 FE) MG tablet Take 1 tablet (325 mg total) by mouth 2 (two) times daily with a  meal. 05/27/14   Lavon Paganini Angiulli, PA-C  fluticasone (FLOVENT HFA) 110 MCG/ACT inhaler Inhale 2 puffs into the lungs 2 (two) times daily. 03/03/14   Kathie Dike, MD  furosemide (LASIX) 40 MG tablet Take 1 tablet (40 mg total) by mouth 2 (two) times daily. 05/27/14   Lavon Paganini Angiulli, PA-C  levothyroxine (SYNTHROID, LEVOTHROID) 50 MCG tablet Take 50 mcg by mouth daily before breakfast.    Historical Provider, MD  megestrol (MEGACE) 40 MG tablet Take 4 tablets (160 mg total) by mouth daily. 05/27/14   Lavon Paganini Angiulli, PA-C  metFORMIN (GLUCOPHAGE) 500 MG tablet Take 1 tablet (500 mg total) by mouth 2 (two) times daily with a meal. 05/27/14   Lavon Paganini Angiulli, PA-C  metoprolol tartrate (LOPRESSOR) 25 MG tablet Take 1 tablet (25 mg total) by mouth 2 (two) times daily. 05/27/14   Lavon Paganini Angiulli, PA-C  pravastatin (PRAVACHOL) 20 MG tablet Take 1 tablet (20 mg total) by mouth daily. 05/27/14   Lavon Paganini Angiulli, PA-C  verapamil (CALAN-SR) 180 MG CR tablet Take 1 tablet (180 mg total) by mouth daily. 05/27/14   Lavon Paganini Angiulli, PA-C   BP 157/76  Pulse 97  Temp(Src) 97.9 F (36.6 C) (Oral)  Resp 18  Ht 5\' 9"  (1.753 m)  Wt 376 lb (170.552 kg)  BMI 55.50 kg/m2  SpO2 100%  LMP 06/04/2014 Physical Exam  Nursing note and vitals reviewed. Constitutional: She appears well-developed and well-nourished.  HENT:  Head: Normocephalic and atraumatic.  Eyes: Conjunctivae are normal.  Neck: Normal range of motion. Neck supple.  Cardiovascular: Normal rate, regular rhythm, normal heart sounds and intact distal pulses.   Pulmonary/Chest: Effort normal and breath sounds normal. No respiratory distress. She has no wheezes. She has no rales. She exhibits no tenderness.  Abdominal: Soft. Bowel sounds are normal. There is no tenderness. There is no guarding.  Musculoskeletal: Normal range of motion. She exhibits edema.  Bilateral ankle edema, 1+.  Neurological: She is alert.  Skin: Skin is warm and dry.   Psychiatric: She has a normal mood and affect.    ED Course  Procedures (including critical care time) Labs Review Labs Reviewed  CBC WITH DIFFERENTIAL - Abnormal; Notable for the following:    Neutro Abs 7.9 (*)    All other components within normal limits  COMPREHENSIVE METABOLIC PANEL - Abnormal; Notable for the following:    Potassium 3.2 (*)    Glucose, Bld 323 (*)    GFR calc non Af Amer 83 (*)    Anion gap 16 (*)    All other components within normal limits  PRO B NATRIURETIC PEPTIDE - Abnormal; Notable for the following:    Pro B Natriuretic peptide (BNP) 234.6 (*)    All other components within normal limits  D-DIMER, QUANTITATIVE - Abnormal; Notable for the following:    D-Dimer, Quant 1.87 (*)    All other components within normal limits  TROPONIN I  TROPONIN I  CBG MONITORING, ED    Imaging Review Dg Chest 2 View  07/25/2014   CLINICAL DATA:  CHEST PAIN SHORTNESS OF BREATH  EXAM: CHEST  2 VIEW  COMPARISON:  Prior radiograph from 05/18/2014  FINDINGS: The cardiac and mediastinal silhouettes are stable in size and contour, and remain within normal limits.  The lungs are normally inflated. No airspace consolidation, pleural effusion, or pulmonary edema is identified. There is no pneumothorax.  No acute osseous abnormality identified.  IMPRESSION: No active cardiopulmonary disease.   Electronically Signed   By: Jeannine Boga M.D.   On: 07/25/2014 22:48   Ct Angio Chest Pe W/cm &/or Wo Cm  07/26/2014   CLINICAL DATA:  Chest pain.  Shortness of breath.  EXAM: CT ANGIOGRAPHY CHEST WITH CONTRAST  TECHNIQUE: Multidetector CT imaging of the chest was performed using the standard protocol during bolus administration of intravenous contrast. Multiplanar CT image reconstructions and MIPs were obtained to evaluate the vascular anatomy.  CONTRAST:  141mL OMNIPAQUE IOHEXOL 350 MG/ML SOLN  COMPARISON:  03/02/2014  FINDINGS: THORACIC INLET/BODY WALL:  No acute abnormality.   MEDIASTINUM:  Normal heart size. No pericardial effusion. Mild mitral annular calcification. No acute aortic findings. 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 There is respiratory motion at the level of the hila which decreases diagnostic sensitivity for detecting pulmonary embolism at the affected levels. Additionally, the patient's body habitus increases quantum mottle. There is no evidence of pulmonary embolism to the segmental level. No adenopathy.  LUNG WINDOWS:  No consolidation.  No effusion.  UPPER ABDOMEN:  No acute findings.  OSSEOUS:  No acute fracture.  No suspicious lytic or blastic lesions.  Review of the MIP images confirms the above findings.  IMPRESSION: No evidence of pulmonary embolism to the segmental level.   Electronically Signed   By: Jorje Guild M.D.   On: 07/26/2014 00:10     EKG Interpretation   Date/Time:  Monday July 25 2014 22:13:08 EDT Ventricular Rate:  95 PR Interval:  159 QRS Duration: 101 QT Interval:  434 QTC Calculation: 546 R Axis:   -24 Text Interpretation:  Sinus rhythm Left ventricular hypertrophy  Nonspecific T abnormalities, lateral leads Prolonged QT interval Confirmed  by ZAMMIT  MD, JOSEPH ([id number]) on 07/26/2014 1:28:29 AM      MDM   Final diagnoses:  Chest pain, unspecified chest pain type  Hypokalemia  Hyperglycemia    Patients labs and/or radiological studies were viewed and considered during the medical decision making and disposition process. Pt with chest pressure improved after albuterol/atrovent neb.  Labs stable including troponin x 2.  D dimer elevated, ct angio negative for PE. Chf.   Pt was encouraged close f/u with cardiology.  Pt was seen by Dr Roderic Palau prior to dc home.  Repeat cbg 270 after insulin given.     Evalee Jefferson, PA-C 07/26/14 0206  Evalee Jefferson, PA-C 07/26/14 0222

## 2014-07-26 LAB — CBG MONITORING, ED: Glucose-Capillary: 270 mg/dL — ABNORMAL HIGH (ref 70–99)

## 2014-07-26 LAB — TROPONIN I: Troponin I: <0.3 ng/mL (ref <0.30)

## 2014-07-26 MED ORDER — INSULIN ASPART 100 UNIT/ML ~~LOC~~ SOLN
SUBCUTANEOUS | Status: AC
  Filled 2014-07-26: qty 1

## 2014-07-26 MED ORDER — POTASSIUM CHLORIDE CRYS ER 20 MEQ PO TBCR
40.0000 meq | EXTENDED_RELEASE_TABLET | Freq: Once | ORAL | Status: AC
  Administered 2014-07-26: 40 meq via ORAL
  Filled 2014-07-26: qty 2

## 2014-07-26 MED ORDER — INSULIN ASPART 100 UNIT/ML ~~LOC~~ SOLN
8.0000 [IU] | Freq: Once | SUBCUTANEOUS | Status: AC
  Administered 2014-07-26: 8 [IU] via INTRAVENOUS
  Filled 2014-07-26: qty 1

## 2014-07-26 MED ORDER — MORPHINE SULFATE 4 MG/ML IJ SOLN
4.0000 mg | Freq: Once | INTRAMUSCULAR | Status: AC
  Administered 2014-07-26: 4 mg via INTRAVENOUS
  Filled 2014-07-26: qty 1

## 2014-07-26 NOTE — Discharge Instructions (Signed)
Chest Pain (Nonspecific) °It is often hard to give a specific diagnosis for the cause of chest pain. There is always a chance that your pain could be related to something serious, such as a heart attack or a blood clot in the lungs. You need to follow up with your health care provider for further evaluation. °CAUSES  °· Heartburn. °· Pneumonia or bronchitis. °· Anxiety or stress. °· Inflammation around your heart (pericarditis) or lung (pleuritis or pleurisy). °· A blood clot in the lung. °· A collapsed lung (pneumothorax). It can develop suddenly on its own (spontaneous pneumothorax) or from trauma to the chest. °· Shingles infection (herpes zoster virus). °The chest wall is composed of bones, muscles, and cartilage. Any of these can be the source of the pain. °· The bones can be bruised by injury. °· The muscles or cartilage can be strained by coughing or overwork. °· The cartilage can be affected by inflammation and become sore (costochondritis). °DIAGNOSIS  °Lab tests or other studies may be needed to find the cause of your pain. Your health care provider may have you take a test called an ambulatory electrocardiogram (ECG). An ECG records your heartbeat patterns over a 24-hour period. You may also have other tests, such as: °· Transthoracic echocardiogram (TTE). During echocardiography, sound waves are used to evaluate how blood flows through your heart. °· Transesophageal echocardiogram (TEE). °· Cardiac monitoring. This allows your health care provider to monitor your heart rate and rhythm in real time. °· Holter monitor. This is a portable device that records your heartbeat and can help diagnose heart arrhythmias. It allows your health care provider to track your heart activity for several days, if needed. °· Stress tests by exercise or by giving medicine that makes the heart beat faster. °TREATMENT  °· Treatment depends on what may be causing your chest pain. Treatment may include: °¨ Acid blockers for  heartburn. °¨ Anti-inflammatory medicine. °¨ Pain medicine for inflammatory conditions. °¨ Antibiotics if an infection is present. °· You may be advised to change lifestyle habits. This includes stopping smoking and avoiding alcohol, caffeine, and chocolate. °· You may be advised to keep your head raised (elevated) when sleeping. This reduces the chance of acid going backward from your stomach into your esophagus. °Most of the time, nonspecific chest pain will improve within 2-3 days with rest and mild pain medicine.  °HOME CARE INSTRUCTIONS  °· If antibiotics were prescribed, take them as directed. Finish them even if you start to feel better. °· For the next few days, avoid physical activities that bring on chest pain. Continue physical activities as directed. °· Do not use any tobacco products, including cigarettes, chewing tobacco, or electronic cigarettes. °· Avoid drinking alcohol. °· Only take medicine as directed by your health care provider. °· Follow your health care provider's suggestions for further testing if your chest pain does not go away. °· Keep any follow-up appointments you made. If you do not go to an appointment, you could develop lasting (chronic) problems with pain. If there is any problem keeping an appointment, call to reschedule. °SEEK MEDICAL CARE IF:  °· Your chest pain does not go away, even after treatment. °· You have a rash with blisters on your chest. °· You have a fever. °SEEK IMMEDIATE MEDICAL CARE IF:  °· You have increased chest pain or pain that spreads to your arm, neck, jaw, back, or abdomen. °· You have shortness of breath. °· You have an increasing cough, or you cough   up blood.  You have severe back or abdominal pain.  You feel nauseous or vomit.  You have severe weakness.  You faint.  You have chills. This is an emergency. Do not wait to see if the pain will go away. Get medical help at once. Call your local emergency services (911 in [place] Do not drive  yourself to the hospital. MAKE SURE YOU:   Understand these instructions.  Will watch your condition.  Will get help right away if you are not doing well or get worse. Document Released: 09/04/2005 Document Revised: 11/30/2013 Document Reviewed: 06/30/2008 [facility] Patient Information 2015 [place], [place]. This information is not intended to replace advice given to you by your health care provider. Make sure you discuss any questions you have with your health care provider.   Be careful to limit your salt intake.  Call you cardiologist for further evaluation if your symptoms persist or worsen, including your ankle edema.

## 2014-07-26 NOTE — ED Provider Notes (Signed)
Medical screening examination/treatment/procedure(s) were performed by non-physician practitioner and as supervising physician I was immediately available for consultation/collaboration.   EKG Interpretation   Date/Time:  Monday July 25 2014 22:13:08 EDT Ventricular Rate:  95 PR Interval:  159 QRS Duration: 101 QT Interval:  434 QTC Calculation: 546 R Axis:   -24 Text Interpretation:  Sinus rhythm Left ventricular hypertrophy  Nonspecific T abnormalities, lateral leads Prolonged QT interval Confirmed  by Carolie Mcilrath  MD, Myron Stankovich ([id number]) on 07/26/2014 1:28:29 AM        Maudry Diego, MD 07/26/14 [phone]

## 2014-08-08 ENCOUNTER — Encounter: Payer: Medicare Other | Admitting: Adult Health

## 2014-08-08 DIAGNOSIS — E559 Vitamin D deficiency, unspecified: Secondary | ICD-10-CM | POA: Diagnosis not present

## 2014-08-08 DIAGNOSIS — E039 Hypothyroidism, unspecified: Secondary | ICD-10-CM | POA: Diagnosis not present

## 2014-08-08 DIAGNOSIS — R609 Edema, unspecified: Secondary | ICD-10-CM | POA: Diagnosis not present

## 2014-08-08 NOTE — Progress Notes (Signed)
    Error, Rescheduled.

## 2014-08-09 DIAGNOSIS — E559 Vitamin D deficiency, unspecified: Secondary | ICD-10-CM | POA: Diagnosis not present

## 2014-08-09 DIAGNOSIS — R609 Edema, unspecified: Secondary | ICD-10-CM | POA: Diagnosis not present

## 2014-08-09 DIAGNOSIS — E039 Hypothyroidism, unspecified: Secondary | ICD-10-CM | POA: Diagnosis not present

## 2014-08-10 ENCOUNTER — Ambulatory Visit (INDEPENDENT_AMBULATORY_CARE_PROVIDER_SITE_OTHER): Payer: Medicare Other | Admitting: Adult Health

## 2014-08-10 ENCOUNTER — Encounter: Payer: Self-pay | Admitting: Adult Health

## 2014-08-10 VITALS — BP 134/70 | HR 76 | Ht 69.0 in | Wt 391.0 lb

## 2014-08-10 DIAGNOSIS — I509 Heart failure, unspecified: Secondary | ICD-10-CM

## 2014-08-10 DIAGNOSIS — I5033 Acute on chronic diastolic (congestive) heart failure: Secondary | ICD-10-CM | POA: Diagnosis not present

## 2014-08-10 DIAGNOSIS — E876 Hypokalemia: Secondary | ICD-10-CM | POA: Diagnosis not present

## 2014-08-10 DIAGNOSIS — I10 Essential (primary) hypertension: Secondary | ICD-10-CM | POA: Diagnosis not present

## 2014-08-10 MED ORDER — FUROSEMIDE 40 MG PO TABS: 40.0000 mg | ORAL_TABLET | Freq: Two times a day (BID) | ORAL | Status: DC

## 2014-08-10 MED ORDER — POTASSIUM CHLORIDE 20 MEQ PO PACK: 40.0000 meq | PACK | Freq: Every day | ORAL | Status: DC

## 2014-08-10 MED ORDER — METOLAZONE 2.5 MG PO TABS: 2.5000 mg | ORAL_TABLET | Freq: Every day | ORAL | Status: DC

## 2014-08-10 NOTE — Assessment & Plan Note (Signed)
She is still fluid overloaded, and up 20 pounds from last visit. I will increase her Lasix to 40 mg twice a day. I will give her metolazone 2.5 mg, which she is to take one dose at tomorrow. She is not on any potassium replacement. Therefore, I will start her on potassium 40 mEq daily, as she was hypokalemic prior to starting Lasix after ER visit. With increased dose of Lasix and use of metolazone. I am concerned as to be very hypokalemic and she is not given any repletion. She will followup with me within one week, she will have not been met drawn in 4 days. If she does not diuresis, we may need to give her one more dose of metolazone, but I want to see what she will do a single dose was increased dose of Lasix.

## 2014-08-10 NOTE — Progress Notes (Signed)
HPI: [name] is a 16 patient Dr. Bronson Ing, whereupon, for ongoing assessment and management of chronic diastolic heart failure, hypertrophic obstructive cardiomyopathy, with history of COPD, diabetes, morbid obesity, and remote history of SVT. The patient was last seen in the office in July of 2015, has increased her activity and had lost 12 pounds since being seen last in the office. She is working with a Engineer, maintenance (IT). At the time of last office visit. She was stable and had no evidence of recurrent SVT or CHF decompensation.   Unfortunately, she was seen in the emergency room on 07/25/2014 with complaints of chest pressure without shortness of breath. Occurring with exertion. She is noted to have increased fluid in her ankles. Prior to coming in and there was a concern for possible CHF exacerbation. Chest x-ray did not reveal evidence of CHF, troponins were negative x2. The patient was treated with albuterol and Atrovent nebulizer treatments. CT scan was negative for PE or CHF.   Review lashes to be hypokalemic with potassium of 3.3. This was not repleted. She was started back on Lasix 40 mg daily, had been on 40 mg twice a day. The patient comes today very set as she has gained approximately 21 pounds since last office visit. She has been taking her Lasix as directed daily per prescription. She continues to feel mildly short of breath. She states that the lower extremity edema is better, but she is still feeling a good bit of fluid in her abdomen. She is upset about the weight gain. She has a scale at home. That was given to her by the hospital, which needs to be calibrated. She has been unable to weigh herself at home.  No Known Allergies  Current Outpatient Prescriptions  Medication Sig Dispense Refill  . albuterol (PROVENTIL HFA;VENTOLIN HFA) 108 (90 BASE) MCG/ACT inhaler Inhale 2 puffs into the lungs every 6 (six) hours as needed for wheezing or shortness of breath.      [name] buPROPion  (WELLBUTRIN XL) 150 MG 24 hr tablet Take 1 tablet (150 mg total) by mouth daily.  30 tablet  0  . ferrous sulfate 325 (65 FE) MG tablet Take 1 tablet (325 mg total) by mouth 2 (two) times daily with a meal.  60 tablet  0  . fluticasone (FLOVENT HFA) 110 MCG/ACT inhaler Inhale 2 puffs into the lungs 2 (two) times daily.  1 Inhaler  12  . furosemide (LASIX) 40 MG tablet Take 1 tablet (40 mg total) by mouth 2 (two) times daily.  60 tablet  1  . furosemide (LASIX) 40 MG tablet Take 1 tablet (40 mg total) by mouth 2 (two) times daily.  60 tablet  1  . furosemide (LASIX) 40 MG tablet Take 1 tablet (40 mg total) by mouth 2 (two) times daily.  60 tablet  1  . levothyroxine (SYNTHROID, LEVOTHROID) 50 MCG tablet Take 50 mcg by mouth daily before breakfast.      . megestrol (MEGACE) 40 MG tablet TAKE FOUR TABLETS BY MOUTH ONCE DAILY  120 tablet  0  . metFORMIN (GLUCOPHAGE) 500 MG tablet Take 1 tablet (500 mg total) by mouth 2 (two) times daily with a meal.  60 tablet  0  . metoprolol tartrate (LOPRESSOR) 25 MG tablet Take 1 tablet (25 mg total) by mouth 2 (two) times daily.  60 tablet  1  . pravastatin (PRAVACHOL) 20 MG tablet Take 1 tablet (20 mg total) by mouth daily.  30 tablet  1  .  metolazone (ZAROXOLYN) 2.5 MG tablet Take 1 tablet (2.5 mg total) by mouth daily.  5 tablet  0  . potassium chloride (KLOR-CON) 20 MEQ packet Take 40 mEq by mouth daily.  30 tablet  3   No current facility-administered medications for this visit.    Past Medical History  Diagnosis Date  . COPD (chronic obstructive pulmonary disease)   . Depression   . Hypertension   . Asthma   . Anxiety   . Anemia   . Blood transfusion without reported diagnosis   . Diabetes mellitus without complication     a. Dx 02/2014.  [name] HOCM (hypertrophic obstructive cardiomyopathy)     a. Dx 95093.  . Vaginal bleeding   . SVT (supraventricular tachycardia)     a. Noted on tele during 02/2014 adm for symptomatic anemia.  [name] NSVT (nonsustained  ventricular tachycardia)     a. Noted on tele during 02/2014 adm for symptomatic anemia.  . Morbid obesity   . Abnormal CT scan     a. 02/2014: evaluated for dyspnea and initially placed on Xarelto for PE because radiologist could not exclude small peripheral pulmonary emboli because of limited contrast. However, after admission for ++vaginal bleeding several days later, it was felt she did NOT have PE - underwent further testing with neg VQ 03/06/14, neg LE duplex.    History reviewed. No pertinent past surgical history.  ROS: Review of systems complete and found to be negative unless listed above  PHYSICAL EXAM BP 134/70  Pulse 76  Ht 5\' 9"  (1.753 m)  Wt 391 lb (177.356 kg)  BMI 57.71 kg/m2  SpO2 95% General: Well developed, well nourished, in no acute distress, morbidly obese. Head: Eyes PERRLA, No xanthomas.   Normal cephalic and atramatic  Lungs: Clear bilaterally to auscultation and percussion. Heart: HRRR S1 S2, without MRG.  Pulses are 2+ & equal.            No carotid bruit. No JVD.  No abdominal bruits. No femoral bruits. Abdomen: Bowel sounds are positive, abdomen soft and non-tender without masses or                  Hernia's noted. Dependent edema in the underside of the abdominal creases, and upper thigh. Msk:  Back normal, normal gait. Normal strength and tone for age. Extremities: No clubbing, cyanosis or edema.  DP +1 Neuro: Alert and oriented X 3. Psych:  Tearful affect, responds appropriately  ASSESSMENT AND PLAN

## 2014-08-10 NOTE — Patient Instructions (Addendum)
Your physician recommends that you schedule a follow-up appointment in: 1 week   Your physician recommends that you return for lab work in: 1 week before your appointment  Your physician has recommended you make the following change in your medication:   TAKE METOLIZONE 2.5 MG TODAY  INCREASE LASIX TO 40 MG TWICE DAILY  START POTASSIUM 40 mEq DAILY  Thank you for choosing [facility]!!

## 2014-08-10 NOTE — Assessment & Plan Note (Signed)
I started her back on potassium replacement. May consider adding spironolactone if necessary for ongoing hypokalemia with potassium replacement.

## 2014-08-10 NOTE — Assessment & Plan Note (Signed)
Blood pressure is elevated for her today. Normally runs around 802, systolic and is now 233 systolic. I believe some of this is related to some fluid overload. We will see her on close followup to evaluate labs, weight loss, and diuresis. She knows to call a stringy problems associated with increased dose of Lasix.

## 2014-08-16 DIAGNOSIS — I10 Essential (primary) hypertension: Secondary | ICD-10-CM | POA: Diagnosis not present

## 2014-08-17 ENCOUNTER — Telehealth: Payer: Self-pay

## 2014-08-17 ENCOUNTER — Other Ambulatory Visit: Payer: Self-pay | Admitting: *Deleted

## 2014-08-17 DIAGNOSIS — E876 Hypokalemia: Secondary | ICD-10-CM

## 2014-08-17 LAB — BASIC METABOLIC PANEL WITH GFR
BUN: 30 mg/dL — AB (ref 6–23)
CHLORIDE: 90 meq/L — AB (ref 96–112)
CO2: 36 mEq/L — ABNORMAL HIGH (ref 19–32)
Calcium: 10 mg/dL (ref 8.4–10.5)
Creat: 1.27 mg/dL — ABNORMAL HIGH (ref 0.50–1.10)
GFR, EST AFRICAN AMERICAN: 59 mL/min — AB
GFR, EST NON AFRICAN AMERICAN: 51 mL/min — AB
GLUCOSE: 191 mg/dL — AB (ref 70–99)
Potassium: 2.6 mEq/L — CL (ref 3.5–5.3)
Sodium: 139 mEq/L (ref 135–145)

## 2014-08-17 MED ORDER — POTASSIUM CHLORIDE CRYS ER 20 MEQ PO TBCR: EXTENDED_RELEASE_TABLET | ORAL | Status: DC

## 2014-08-17 NOTE — Telephone Encounter (Signed)
Erin Sexton at [facility] called with critical lab: K+  2.6     Will forward to M.Lenze P.A.-C.

## 2014-08-17 NOTE — Progress Notes (Signed)
Pt knows to take 6 tablets of KCL 40mEq today then 2 tablets twice daily after that. Pt will get blood work Friday before appt.

## 2014-08-18 NOTE — Telephone Encounter (Signed)
Addendum spoke with pt per Estella Husk pt knows to take 6 tabs 20 mEq today and 2 tabs twice daily after that. Pt will get blood work Friday AM before appt

## 2014-08-19 ENCOUNTER — Encounter: Payer: Self-pay | Admitting: Adult Health

## 2014-08-19 ENCOUNTER — Ambulatory Visit (INDEPENDENT_AMBULATORY_CARE_PROVIDER_SITE_OTHER): Payer: Medicare Other | Admitting: Adult Health

## 2014-08-19 VITALS — BP 125/83 | HR 76 | Ht 69.0 in | Wt 396.0 lb

## 2014-08-19 DIAGNOSIS — E876 Hypokalemia: Secondary | ICD-10-CM

## 2014-08-19 DIAGNOSIS — N924 Excessive bleeding in the premenopausal period: Secondary | ICD-10-CM

## 2014-08-19 DIAGNOSIS — I421 Obstructive hypertrophic cardiomyopathy: Secondary | ICD-10-CM

## 2014-08-19 LAB — BASIC METABOLIC PANEL
BUN: 16 mg/dL (ref 6–23)
CHLORIDE: 98 meq/L (ref 96–112)
CO2: 35 mEq/L — ABNORMAL HIGH (ref 19–32)
Calcium: 8.9 mg/dL (ref 8.4–10.5)
Creat: 0.98 mg/dL (ref 0.50–1.10)
GLUCOSE: 209 mg/dL — AB (ref 70–99)
POTASSIUM: 3.3 meq/L — AB (ref 3.5–5.3)
Sodium: 143 mEq/L (ref 135–145)

## 2014-08-19 MED ORDER — POTASSIUM CHLORIDE CRYS ER 20 MEQ PO TBCR: EXTENDED_RELEASE_TABLET | ORAL | Status: DC

## 2014-08-19 MED ORDER — TORSEMIDE 20 MG PO TABS: 20.0000 mg | ORAL_TABLET | Freq: Two times a day (BID) | ORAL | Status: DC

## 2014-08-19 MED ORDER — SPIRONOLACTONE 25 MG PO TABS: 12.5000 mg | ORAL_TABLET | Freq: Every day | ORAL | Status: DC

## 2014-08-19 MED ORDER — TORSEMIDE 20 MG PO TABS
40.0000 mg | ORAL_TABLET | Freq: Two times a day (BID) | ORAL | Status: DC
Start: 2014-08-19 — End: 2014-08-19

## 2014-08-19 NOTE — Assessment & Plan Note (Addendum)
Will have her see Dr. Bronson Ing in 1 month to 6 weeks. He may wish to adjust her medications further, HR is  well controlled on metoprolol.

## 2014-08-19 NOTE — Assessment & Plan Note (Signed)
I will begin spironolactone 12.5 mg daily to assist with potassium sparing. I will decrease potassium replacement to 40 mEq daily. She will have follow up BMET in 1 week.

## 2014-08-19 NOTE — Patient Instructions (Addendum)
Your physician recommends that you schedule a follow-up appointment in: 4-6 weeks with [place] physician has recommended you make the following change in your medication:    STOP Lasix and Metolazone   DECREASE Potassium to 40 meq daily  DECREASE Torsemide to 20 mg twice a day  START Spironolactone  12.5 mg daily  Lab work Artist) in 1 week     Thank you for choosing [facility] !

## 2014-08-19 NOTE — Assessment & Plan Note (Signed)
She is on megace for this, which also contributes to an increased appetite. She going to speak with her Ob-GYN about this as she is trying to lose weight as well.

## 2014-08-19 NOTE — Progress Notes (Signed)
HPI: Sr. is a 43 year old, morbidly obese, female, patient of Dr. Bronson Ing that we are following for ongoing assessment and management of chronic diastolic heart failure, hypertrophic obstructive cardiomyopathy, history of COPD, diabetes. She was last seen in the office on 9 2 2015 and gained approximately 20 pounds in the last 2 weeks. Her Lasix had been decreased to 40 mg daily.   I increased her Lasix to 40 mg twice a day and added one dose of  Metolazone,. . She was started on potassium 40 mEq daily, as she was found to be hypokalemic in the ER, when she was seen there for fluid retention. A followup BMET was completed, which revealed severe hypokalemia of 2.6. This was addressed by Ms. Bonnell Public, [place] [facility]. She was provided with extra doses of potassium. She is here on followup with a BMET to be completed prior to this office visit  Repeat labs: Na: 142, Potassium 3.3, Creatinine 0.98.   She is feeling some better. Per home scales, she is down 8 lbs. She states that Dr. Desiree Lucy, her PCP, has found her to have low Vit D. He is treating her for this.   No Known Allergies  Current Outpatient Prescriptions  Medication Sig Dispense Refill  . albuterol (PROVENTIL HFA;VENTOLIN HFA) 108 (90 BASE) MCG/ACT inhaler Inhale 2 puffs into the lungs every 6 (six) hours as needed for wheezing or shortness of breath.      [name] buPROPion (WELLBUTRIN XL) 150 MG 24 hr tablet Take 1 tablet (150 mg total) by mouth daily.  30 tablet  0  . ferrous sulfate 325 (65 FE) MG tablet Take 1 tablet (325 mg total) by mouth 2 (two) times daily with a meal.  60 tablet  0  . fluticasone (FLOVENT HFA) 110 MCG/ACT inhaler Inhale 2 puffs into the lungs 2 (two) times daily.  1 Inhaler  12  . levothyroxine (SYNTHROID, LEVOTHROID) 50 MCG tablet Take 50 mcg by mouth daily before breakfast.      . megestrol (MEGACE) 40 MG tablet TAKE FOUR TABLETS BY MOUTH ONCE DAILY  120 tablet  0  . metFORMIN (GLUCOPHAGE) 500 MG tablet Take 1 tablet (500 mg  total) by mouth 2 (two) times daily with a meal.  60 tablet  0  . metolazone (ZAROXOLYN) 2.5 MG tablet Take 1 tablet (2.5 mg total) by mouth daily.  5 tablet  0  . metoprolol tartrate (LOPRESSOR) 25 MG tablet Take 1 tablet (25 mg total) by mouth 2 (two) times daily.  60 tablet  1  . potassium chloride SA (K-DUR,KLOR-CON) 20 MEQ tablet Take 6 tablets  180 tablet  3  . pravastatin (PRAVACHOL) 20 MG tablet Take 1 tablet (20 mg total) by mouth daily.  30 tablet  1   No current facility-administered medications for this visit.    Past Medical History  Diagnosis Date  . COPD (chronic obstructive pulmonary disease)   . Depression   . Hypertension   . Asthma   . Anxiety   . Anemia   . Blood transfusion without reported diagnosis   . Diabetes mellitus without complication     a. Dx 02/2014.  [name] HOCM (hypertrophic obstructive cardiomyopathy)     a. Dx 39030.  . Vaginal bleeding   . SVT (supraventricular tachycardia)     a. Noted on tele during 02/2014 adm for symptomatic anemia.  [name] NSVT (nonsustained ventricular tachycardia)     a. Noted on tele during 02/2014 adm for symptomatic anemia.  . Morbid obesity   .  Abnormal CT scan     a. 02/2014: evaluated for dyspnea and initially placed on Xarelto for PE because radiologist could not exclude small peripheral pulmonary emboli because of limited contrast. However, after admission for ++vaginal bleeding several days later, it was felt she did NOT have PE - underwent further testing with neg VQ 03/06/14, neg LE duplex.    History reviewed. No pertinent past surgical history.  ROS: Review of systems complete and found to be negative unless listed above  PHYSICAL EXAM BP 125/83  Pulse 76  Ht 5\' 9"  (1.753 m)  Wt 396 lb (179.624 kg)  BMI 58.45 kg/m2 General: Well developed, well nourished, in no acute distress, morbidly obese. Head: Eyes PERRLA, No xanthomas.   Normal cephalic and atramatic  Lungs: Clear bilaterally to auscultation and  percussion. Heart: HRRR S1 S2, with 2/6 holosystolic harsh murmur.  Pulses are 2+ & equal.            No carotid bruit. No JVD.  No abdominal bruits. No femoral bruits. Abdomen: Bowel sounds are positive, abdomen soft and non-tender without masses or                  Hernia's noted. Msk:  Back normal, normal gait. Normal strength and tone for age. Extremities: No clubbing, cyanosis or edema.  DP +1 Neuro: Alert and oriented X 3. Psych:  Good affect, responds appropriately    ASSESSMENT AND PLAN

## 2014-08-19 NOTE — Progress Notes (Signed)
Name: Erin Sexton    DOB: 05-28-1971  Age: 43 y.o.  MR#: [id number]       PCP:  Oren Binet, MD      Insurance: Payor: MEDICARE / Plan: MEDICARE PART A AND B / Product Type: *No Product type* /   CC:    Chief Complaint  Patient presents with  . Congestive Heart Failure  . Cardiomyopathy    VS Filed Vitals:   08/19/14 1321  BP: 125/83  Pulse: 76  Height: $Remove'5\' 9"'CIhfUtB$  (1.753 m)  Weight: 396 lb (179.624 kg)    Weights Current Weight  08/19/14 396 lb (179.624 kg)  08/10/14 391 lb (177.356 kg)  07/25/14 376 lb (170.552 kg)    Blood Pressure  BP Readings from Last 3 Encounters:  08/19/14 125/83  08/10/14 134/70  07/25/14 157/76     Admit date:  (Not on file) Last encounter with RMR:  08/10/2014   Allergy Review of patient's allergies indicates no known allergies.  Current Outpatient Prescriptions  Medication Sig Dispense Refill  . albuterol (PROVENTIL HFA;VENTOLIN HFA) 108 (90 BASE) MCG/ACT inhaler Inhale 2 puffs into the lungs every 6 (six) hours as needed for wheezing or shortness of breath.      [name] buPROPion (WELLBUTRIN XL) 150 MG 24 hr tablet Take 1 tablet (150 mg total) by mouth daily.  30 tablet  0  . ferrous sulfate 325 (65 FE) MG tablet Take 1 tablet (325 mg total) by mouth 2 (two) times daily with a meal.  60 tablet  0  . fluticasone (FLOVENT HFA) 110 MCG/ACT inhaler Inhale 2 puffs into the lungs 2 (two) times daily.  1 Inhaler  12  . furosemide (LASIX) 40 MG tablet Take 80 mg by mouth 2 (two) times daily.      [name] levothyroxine (SYNTHROID, LEVOTHROID) 50 MCG tablet Take 50 mcg by mouth daily before breakfast.      . megestrol (MEGACE) 40 MG tablet TAKE FOUR TABLETS BY MOUTH ONCE DAILY  120 tablet  0  . metFORMIN (GLUCOPHAGE) 500 MG tablet Take 1 tablet (500 mg total) by mouth 2 (two) times daily with a meal.  60 tablet  0  . metolazone (ZAROXOLYN) 2.5 MG tablet Take 1 tablet (2.5 mg total) by mouth daily.  5 tablet  0  . metoprolol tartrate (LOPRESSOR) 25 MG tablet  Take 1 tablet (25 mg total) by mouth 2 (two) times daily.  60 tablet  1  . potassium chloride SA (K-DUR,KLOR-CON) 20 MEQ tablet Take 6 tablets  180 tablet  3  . pravastatin (PRAVACHOL) 20 MG tablet Take 1 tablet (20 mg total) by mouth daily.  30 tablet  1   No current facility-administered medications for this visit.    Discontinued Meds:    Medications Discontinued During This Encounter  Medication Reason  . furosemide (LASIX) 40 MG tablet Error  . furosemide (LASIX) 40 MG tablet Error  . furosemide (LASIX) 40 MG tablet     Patient Active Problem List   Diagnosis Date Noted  . Complex endometrial hyperplasia without atypia 06/08/2014  . Menorrhagia 06/01/2014  . Physical deconditioning 05/25/2014  . Catheter-associated urinary tract infection 05/20/2014  . Hypokalemia 05/20/2014  . Acute on chronic diastolic congestive heart failure 05/18/2014  . Diabetes mellitus type 2, noninsulin dependent   . OSA (obstructive sleep apnea) 03/21/2014  . Vaginal bleeding 03/06/2014  . Acute blood loss anemia 03/06/2014  . HOCM (hypertrophic obstructive cardiomyopathy) 03/06/2014  . Morbid obesity 03/02/2014  . Essential hypertension,  benign 03/02/2014  . Asthma, chronic 03/02/2014    LABS    Component Value Date/Time   NA 143 08/19/2014 0905   NA 139 08/16/2014 1055   NA 142 07/25/2014 2223   K 3.3* 08/19/2014 0905   K 2.6* 08/16/2014 1055   K 3.2* 07/25/2014 2223   CL 98 08/19/2014 0905   CL 90* 08/16/2014 1055   CL 101 07/25/2014 2223   CO2 35* 08/19/2014 0905   CO2 36* 08/16/2014 1055   CO2 25 07/25/2014 2223   GLUCOSE 209* 08/19/2014 0905   GLUCOSE 191* 08/16/2014 1055   GLUCOSE 323* 07/25/2014 2223   BUN 16 08/19/2014 0905   BUN 30* 08/16/2014 1055   BUN 15 07/25/2014 2223   CREATININE 0.98 08/19/2014 0905   CREATININE 1.27* 08/16/2014 1055   CREATININE 0.84 07/25/2014 2223   CREATININE 1.03 05/26/2014 0620   CREATININE 0.93 05/25/2014 0646   CALCIUM 8.9 08/19/2014 0905   CALCIUM 10.0 08/16/2014  1055   CALCIUM 9.1 07/25/2014 2223   GFRNONAA 51* 08/16/2014 1055   GFRNONAA 83* 07/25/2014 2223   GFRNONAA 65* 05/26/2014 0620   GFRNONAA 73* 05/25/2014 0646   GFRAA 59* 08/16/2014 1055   GFRAA >90 07/25/2014 2223   GFRAA 75* 05/26/2014 0620   GFRAA 85* 05/25/2014 0646   CMP     Component Value Date/Time   NA 143 08/19/2014 0905   K 3.3* 08/19/2014 0905   CL 98 08/19/2014 0905   CO2 35* 08/19/2014 0905   GLUCOSE 209* 08/19/2014 0905   BUN 16 08/19/2014 0905   CREATININE 0.98 08/19/2014 0905   CREATININE 0.84 07/25/2014 2223   CALCIUM 8.9 08/19/2014 0905   PROT 7.2 07/25/2014 2223   ALBUMIN 3.5 07/25/2014 2223   AST 18 07/25/2014 2223   ALT 17 07/25/2014 2223   ALKPHOS 94 07/25/2014 2223   BILITOT 0.3 07/25/2014 2223   GFRNONAA 51* 08/16/2014 1055   GFRNONAA 83* 07/25/2014 2223   GFRAA 59* 08/16/2014 1055   GFRAA >90 07/25/2014 2223       Component Value Date/Time   WBC 10.4 07/25/2014 2223   WBC 8.3 05/26/2014 0620   WBC 8.5 05/25/2014 0646   HGB 13.3 07/25/2014 2223   HGB 11.1* 06/01/2014 1453   HGB 8.9* 05/26/2014 0620   HGB 8.9* 05/25/2014 0646   HCT 39.8 07/25/2014 2223   HCT 28.2* 05/26/2014 0620   HCT 28.6* 05/25/2014 0646   MCV 84.9 07/25/2014 2223   MCV 86.8 05/26/2014 0620   MCV 86.9 05/25/2014 0646    Lipid Panel     Component Value Date/Time   CHOL 124 05/15/2014 0456   TRIG 111 05/15/2014 0456   HDL 25* 05/15/2014 0456   CHOLHDL 5.0 05/15/2014 0456   VLDL 22 05/15/2014 0456   LDLCALC 77 05/15/2014 0456    ABG    Component Value Date/Time   TCO2 21 05/15/2014 0032     No results found for this basename: TSH   BNP (last 3 results)  Recent Labs  03/07/14 0841 05/18/14 1345 07/25/14 2223  PROBNP 264.6* 2656.0* 234.6*   Cardiac Panel (last 3 results) No results found for this basename: CKTOTAL, CKMB, TROPONINI, RELINDX,  in the last 72 hours  Iron/TIBC/Ferritin/ %Sat    Component Value Date/Time   IRON 15* 05/15/2014 0725   TIBC 276 05/15/2014 0725   FERRITIN 9* 05/15/2014 0725    IRONPCTSAT 5* 05/15/2014 0725     EKG Orders placed during the hospital encounter of 07/25/14  .  EKG 12-LEAD  . EKG 12-LEAD  . ED EKG  . ED EKG     Prior Assessment and Plan Problem List as of 08/19/2014     Cardiovascular and Mediastinum   Essential hypertension, benign   Last Assessment & Plan   08/10/2014 Office Visit Written 08/10/2014  3:36 PM by Lendon Colonel, NP     Blood pressure is elevated for her today. Normally runs around 195, systolic and is now 093 systolic. I believe some of this is related to some fluid overload. We will see her on close followup to evaluate labs, weight loss, and diuresis. She knows to call a stringy problems associated with increased dose of Lasix.    HOCM (hypertrophic obstructive cardiomyopathy)   Last Assessment & Plan   07/07/2014 Office Visit Written 07/07/2014  4:44 PM by Lendon Colonel, NP     Heart rate  and blood pressure are well controlled currently on verapamil 180 mg daily, and metoprolol 25 mg twice a day. Blood pressure was low on evaluation her triage, I rechecked it it was found to be 112/70. With weight loss, may need to start decreasing the verapamil dose. She is losing approximately 10 pounds a month. She will have close followup concerning her blood pressure and symptoms associated with HOCM. We will keep her on current medication regimen at this time without changes    Acute on chronic diastolic congestive heart failure   Last Assessment & Plan   08/10/2014 Office Visit Written 08/10/2014  3:36 PM by Lendon Colonel, NP     She is still fluid overloaded, and up 20 pounds from last visit. I will increase her Lasix to 40 mg twice a day. I will give her metolazone 2.5 mg, which she is to take one dose at tomorrow. She is not on any potassium replacement. Therefore, I will start her on potassium 40 mEq daily, as she was hypokalemic prior to starting Lasix after ER visit. With increased dose of Lasix and use of metolazone. I am concerned  as to be very hypokalemic and she is not given any repletion. She will followup with me within one week, she will have not been met drawn in 4 days. If she does not diuresis, we may need to give her one more dose of metolazone, but I want to see what she will do a single dose was increased dose of Lasix.      Respiratory   Asthma, chronic   OSA (obstructive sleep apnea)   Last Assessment & Plan   04/27/2014 Office Visit Edited 04/27/2014  3:50 PM by Chesley Mires, MD     She reports snoring, sleep disruption, apnea, and daytime sleepiness.  She has history of HTN with diastolic dysfunction.  She reports prior history of severe sleep apnea.  I am concerned she still has sleep apnea.  She has morbid obesity, and I am concerned she could also have sleep related hypoventilation.  We discussed how sleep apnea can affect various health problems including risks for hypertension, cardiovascular disease, and diabetes.  We also discussed how sleep disruption can increase risks for accident, such as while driving.  Weight loss as a means of improving sleep apnea was also reviewed.  Additional treatment options discussed were CPAP therapy, oral appliance, and surgical intervention.  To further assess will arrange for in lab sleep study.       Endocrine   Diabetes mellitus type 2, noninsulin dependent     Genitourinary  Catheter-associated urinary tract infection   Complex endometrial hyperplasia without atypia     Other   Morbid obesity   Last Assessment & Plan   07/07/2014 Office Visit Written 07/07/2014  4:46 PM by Lendon Colonel, NP     She is losing approximately 10 pounds a month, walking a mile a day, and restricting calories. She is followed by her primary care physician, with frequent appointment to check her status.    Vaginal bleeding   Acute blood loss anemia   Hypokalemia   Last Assessment & Plan   08/10/2014 Office Visit Written 08/10/2014  3:37 PM by Lendon Colonel, NP     I  started her back on potassium replacement. May consider adding spironolactone if necessary for ongoing hypokalemia with potassium replacement.    Physical deconditioning   Menorrhagia       Imaging: Dg Chest 2 View  07/25/2014   CLINICAL DATA:  CHEST PAIN SHORTNESS OF BREATH  EXAM: CHEST  2 VIEW  COMPARISON:  Prior radiograph from 05/18/2014  FINDINGS: The cardiac and mediastinal silhouettes are stable in size and contour, and remain within normal limits.  The lungs are normally inflated. No airspace consolidation, pleural effusion, or pulmonary edema is identified. There is no pneumothorax.  No acute osseous abnormality identified.  IMPRESSION: No active cardiopulmonary disease.   Electronically Signed   By: Jeannine Boga M.D.   On: 07/25/2014 22:48   Ct Angio Chest Pe W/cm &/or Wo Cm  07/26/2014   CLINICAL DATA:  Chest pain.  Shortness of breath.  EXAM: CT ANGIOGRAPHY CHEST WITH CONTRAST  TECHNIQUE: Multidetector CT imaging of the chest was performed using the standard protocol during bolus administration of intravenous contrast. Multiplanar CT image reconstructions and MIPs were obtained to evaluate the vascular anatomy.  CONTRAST:  172mL OMNIPAQUE IOHEXOL 350 MG/ML SOLN  COMPARISON:  03/02/2014  FINDINGS: THORACIC INLET/BODY WALL:  No acute abnormality.  MEDIASTINUM:  Normal heart size. No pericardial effusion. Mild mitral annular calcification. No acute aortic findings. 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 There is respiratory motion at the level of the hila which decreases diagnostic sensitivity for detecting pulmonary embolism at the affected levels. Additionally, the patient's body habitus increases quantum mottle. There is no evidence of pulmonary embolism to the segmental level. No adenopathy.  LUNG WINDOWS:  No consolidation.  No effusion.  UPPER ABDOMEN:  No acute findings.  OSSEOUS:  No acute fracture.  No suspicious lytic or blastic lesions.  Review  of the MIP images confirms the above findings.  IMPRESSION: No evidence of pulmonary embolism to the segmental level.   Electronically Signed   By: Jorje Guild M.D.   On: 07/26/2014 00:10

## 2014-08-19 NOTE — Assessment & Plan Note (Signed)
She did respond to lasix increase and to use of metolazone. Some hypokalemia was noted but replaced. She will be changed to torsemide 20 mg BID for better bioavailability and stop the lasix 40 mg BID. She will have BMET in one week. She is to avoid salty foods. Weigh daily.

## 2014-08-22 ENCOUNTER — Telehealth: Payer: Self-pay | Admitting: Obstetrics & Gynecology

## 2014-08-22 DIAGNOSIS — E559 Vitamin D deficiency, unspecified: Secondary | ICD-10-CM | POA: Diagnosis not present

## 2014-08-22 DIAGNOSIS — E039 Hypothyroidism, unspecified: Secondary | ICD-10-CM | POA: Diagnosis not present

## 2014-08-22 NOTE — Telephone Encounter (Signed)
Pt states saw her PCP this am, and was informed Megace can cause weight gain. Pt takes thyroid medication for weight loss. Is there another alternative?

## 2014-08-23 ENCOUNTER — Telehealth: Payer: Self-pay | Admitting: *Deleted

## 2014-08-23 NOTE — Telephone Encounter (Signed)
Megace in high dose 160 mg is for weight gain  Not 40 mg  No not an alternative that works

## 2014-08-23 NOTE — Telephone Encounter (Signed)
Pt informed per Dr. Elonda Husky to continues the Megace 40 mg take 4 tablets daily due to pt history. Pt verbalized understanding.

## 2014-08-26 DIAGNOSIS — E876 Hypokalemia: Secondary | ICD-10-CM | POA: Diagnosis not present

## 2014-08-26 LAB — BASIC METABOLIC PANEL
BUN: 26 mg/dL — AB (ref 6–23)
CO2: 30 meq/L (ref 19–32)
Calcium: 9.8 mg/dL (ref 8.4–10.5)
Chloride: 98 mEq/L (ref 96–112)
Creat: 1.16 mg/dL — ABNORMAL HIGH (ref 0.50–1.10)
Glucose, Bld: 189 mg/dL — ABNORMAL HIGH (ref 70–99)
Potassium: 4.1 mEq/L (ref 3.5–5.3)
Sodium: 140 mEq/L (ref 135–145)

## 2014-08-29 ENCOUNTER — Telehealth: Payer: Self-pay | Admitting: Cardiovascular Disease

## 2014-08-29 ENCOUNTER — Telehealth: Payer: Self-pay | Admitting: *Deleted

## 2014-08-29 NOTE — Telephone Encounter (Signed)
Pt notified and forwarded results to pcp

## 2014-08-29 NOTE — Telephone Encounter (Signed)
Message copied by Desma Mcgregor on Mon Aug 29, 2014  2:25 PM ------      Message from: Lendon Colonel      Created: Sun Aug 28, 2014 10:33 AM       Reviewed. No changes in current regimen ------

## 2014-08-29 NOTE — Telephone Encounter (Signed)
Lab results / tgs  °

## 2014-09-17 DIAGNOSIS — Z23 Encounter for immunization: Secondary | ICD-10-CM | POA: Diagnosis not present

## 2014-09-23 NOTE — Telephone Encounter (Signed)
Results were given/tmj

## 2014-09-26 ENCOUNTER — Encounter: Payer: Self-pay | Admitting: Cardiovascular Disease

## 2014-09-26 ENCOUNTER — Ambulatory Visit (INDEPENDENT_AMBULATORY_CARE_PROVIDER_SITE_OTHER): Payer: Medicare Other | Admitting: Cardiovascular Disease

## 2014-09-26 VITALS — BP 116/74 | HR 77 | Ht 69.0 in | Wt 367.0 lb

## 2014-09-26 DIAGNOSIS — I421 Obstructive hypertrophic cardiomyopathy: Secondary | ICD-10-CM

## 2014-09-26 DIAGNOSIS — G4733 Obstructive sleep apnea (adult) (pediatric): Secondary | ICD-10-CM

## 2014-09-26 DIAGNOSIS — I10 Essential (primary) hypertension: Secondary | ICD-10-CM

## 2014-09-26 DIAGNOSIS — E785 Hyperlipidemia, unspecified: Secondary | ICD-10-CM

## 2014-09-26 DIAGNOSIS — I5032 Chronic diastolic (congestive) heart failure: Secondary | ICD-10-CM

## 2014-09-26 NOTE — Patient Instructions (Signed)
Your physician wants you to follow-up in: 6 months You will receive a reminder letter in the mail two months in advance. If you don't receive a letter, please call our office to schedule the follow-up appointment.     Your physician recommends that you continue on your current medications as directed. Please refer to the Current Medication list given to you today.      Thank you for choosing [facility] !        

## 2014-09-26 NOTE — Progress Notes (Signed)
Patient ID: Erin Sexton, female   DOB: Sep 07, 1971, 43 y.o.   MRN: [id number]      SUBJECTIVE: The patient returns for followup and has a history of hypertropic cardiomyopathy, morbid obesity, hypertension, asthma, sleep apnea, hyperlipidemia, anemia, and chronic diastolic heart failure with grade 2 diastolic dysfunction. She had an episode of palpitations approximately 2-3 nights ago which lasted 20 minutes but has not been bothered by them since. She said her leg swelling has come down significantly. She used to have to ride in a cart at [employer] but now is able to walk around the entire store and she is happy about this.   Review of Systems: As per "subjective", otherwise negative.  No Known Allergies  Current Outpatient Prescriptions  Medication Sig Dispense Refill  . albuterol (PROVENTIL HFA;VENTOLIN HFA) 108 (90 BASE) MCG/ACT inhaler Inhale 2 puffs into the lungs every 6 (six) hours as needed for wheezing or shortness of breath.      [name] buPROPion (WELLBUTRIN XL) 150 MG 24 hr tablet Take 1 tablet (150 mg total) by mouth daily.  30 tablet  0  . ferrous sulfate 325 (65 FE) MG tablet Take 1 tablet (325 mg total) by mouth 2 (two) times daily with a meal.  60 tablet  0  . fluticasone (FLOVENT HFA) 110 MCG/ACT inhaler Inhale 2 puffs into the lungs 2 (two) times daily.  1 Inhaler  12  . levothyroxine (SYNTHROID, LEVOTHROID) 50 MCG tablet Take 50 mcg by mouth daily before breakfast.      . megestrol (MEGACE) 40 MG tablet TAKE FOUR TABLETS BY MOUTH ONCE DAILY  120 tablet  0  . metFORMIN (GLUCOPHAGE) 500 MG tablet Take 1 tablet (500 mg total) by mouth 2 (two) times daily with a meal.  60 tablet  0  . metoprolol tartrate (LOPRESSOR) 25 MG tablet Take 1 tablet (25 mg total) by mouth 2 (two) times daily.  60 tablet  1  . potassium chloride SA (K-DUR,KLOR-CON) 20 MEQ tablet Take 2 tablets dailly (40 meq)  60 tablet  3  . pravastatin (PRAVACHOL) 20 MG tablet Take 1 tablet (20 mg total) by mouth  daily.  30 tablet  1  . spironolactone (ALDACTONE) 25 MG tablet Take 0.5 tablets (12.5 mg total) by mouth daily.  30 tablet  6  . torsemide (DEMADEX) 20 MG tablet Take 1 tablet (20 mg total) by mouth 2 (two) times daily.  60 tablet  6   No current facility-administered medications for this visit.    Past Medical History  Diagnosis Date  . COPD (chronic obstructive pulmonary disease)   . Depression   . Hypertension   . Asthma   . Anxiety   . Anemia   . Blood transfusion without reported diagnosis   . Diabetes mellitus without complication     a. Dx 02/2014.  [name] HOCM (hypertrophic obstructive cardiomyopathy)     a. Dx 37902.  . Vaginal bleeding   . SVT (supraventricular tachycardia)     a. Noted on tele during 02/2014 adm for symptomatic anemia.  [name] NSVT (nonsustained ventricular tachycardia)     a. Noted on tele during 02/2014 adm for symptomatic anemia.  . Morbid obesity   . Abnormal CT scan     a. 02/2014: evaluated for dyspnea and initially placed on Xarelto for PE because radiologist could not exclude small peripheral pulmonary emboli because of limited contrast. However, after admission for ++vaginal bleeding several days later, it was felt she did NOT have PE -  underwent further testing with neg VQ 03/06/14, neg LE duplex.    No past surgical history on file.  History   Social History  . Marital Status: Legally Separated    Spouse Name: N/A    Number of Children: N/A  . Years of Education: N/A   Occupational History  . Not on file.   Social History Main Topics  . Smoking status: Never Smoker   . Smokeless tobacco: Never Used  . Alcohol Use: No  . Drug Use: No  . Sexual Activity: Not on file   Other Topics Concern  . Not on file   Social History Narrative  . No narrative on file     Filed Vitals:   09/26/14 1351  BP: 116/74  Pulse: 77  Height: 5\' 9"  (1.753 m)  Weight: 367 lb (166.47 kg)    PHYSICAL EXAM General: NAD, morbidly obese. HEENT:  Normal. Neck: Difficult to assess JVP, no thyromegaly. Lungs: Clear to auscultation bilaterally with normal respiratory effort. CV: Nondisplaced PMI.  Regular rate and rhythm, normal S1/S2, no S3/S4, soft II/VI pansystolic murmur along left sternal border. Trace periankle edema.  No carotid bruit.  Normal pedal pulses.  Abdomen: Morbidly obese. Neurologic: Alert and oriented x 3.  Psych: Normal affect. Skin: Normal. Musculoskeletal: Normal range of motion, no gross deformities. Extremities: No clubbing or cyanosis.   ECG: Most recent ECG reviewed.      ASSESSMENT AND PLAN: 1. Hypertrophic cardiomyopathy: Stable with no changes to medical therapy. Continue metoprolol 25 mg twice daily. 2. Chronic diastolic heart failure: Appears euvolemic on torsemide 20 mg twice daily and Spiriva lactone 12.5 mg daily. No changes to therapy. 3. Essential HTN: Well controlled on spironolactone and metoprolol. No changes to therapy. 4. Hyperlipidemia: Continue pravastatin 20 mg daily. 5. Sleep apnea: Had been intolerant to CPAP in the past. Encouraged to proceed with followup with a sleep specialist. 6. Morbid obesity: Exercise and continued weight loss encouraged. Is considering weight reduction surgery.  Dispo: f/u 6 months.  Kate Sable, M.D., F.A.C.C.

## 2014-10-25 DIAGNOSIS — J0100 Acute maxillary sinusitis, unspecified: Secondary | ICD-10-CM | POA: Diagnosis not present

## 2014-11-02 ENCOUNTER — Encounter: Payer: Self-pay | Admitting: Obstetrics & Gynecology

## 2014-11-02 ENCOUNTER — Ambulatory Visit (INDEPENDENT_AMBULATORY_CARE_PROVIDER_SITE_OTHER): Payer: Medicare Other | Admitting: Obstetrics & Gynecology

## 2014-11-02 VITALS — BP 120/70 | Wt 382.4 lb

## 2014-11-02 DIAGNOSIS — N8501 Benign endometrial hyperplasia: Secondary | ICD-10-CM | POA: Diagnosis not present

## 2014-11-02 MED ORDER — MEGESTROL ACETATE 40 MG PO TABS: ORAL_TABLET | ORAL | Status: DC

## 2014-11-02 NOTE — Progress Notes (Signed)
Patient ID: Erin Sexton, female   DOB: 1971/01/02, 43 y.o.   MRN: [id number] Pt has a very long history of very heavy menstrual periods requiring transfusion on 4 occasions, the last time 6 units at [facility] well on megestrol 4 per day, essentially amenorrhiec occasional spotting  Will continue on that  Past Medical History  Diagnosis Date  . COPD (chronic obstructive pulmonary disease)   . Depression   . Hypertension   . Asthma   . Anxiety   . Anemia   . Blood transfusion without reported diagnosis   . Diabetes mellitus without complication     a. Dx 02/2014.  [name] HOCM (hypertrophic obstructive cardiomyopathy)     a. Dx 78295.  . Vaginal bleeding   . SVT (supraventricular tachycardia)     a. Noted on tele during 02/2014 adm for symptomatic anemia.  [name] NSVT (nonsustained ventricular tachycardia)     a. Noted on tele during 02/2014 adm for symptomatic anemia.  . Morbid obesity   . Abnormal CT scan     a. 02/2014: evaluated for dyspnea and initially placed on Xarelto for PE because radiologist could not exclude small peripheral pulmonary emboli because of limited contrast. However, after admission for ++vaginal bleeding several days later, it was felt she did NOT have PE - underwent further testing with neg VQ 03/06/14, neg LE duplex.    History reviewed. No pertinent past surgical history.  OB History    Gravida Para Term Preterm AB TAB SAB Ectopic Multiple Living            0      No Known Allergies  History   Social History  . Marital Status: Legally Separated    Spouse Name: N/A    Number of Children: N/A  . Years of Education: N/A   Social History Main Topics  . Smoking status: Never Smoker   . Smokeless tobacco: Never Used  . Alcohol Use: No  . Drug Use: No  . Sexual Activity: None   Other Topics Concern  . None   Social History Narrative    Family History  Problem Relation Age of Onset  . CAD Neg Hx     No family history of EARLY CAD

## 2014-11-08 ENCOUNTER — Encounter (INDEPENDENT_AMBULATORY_CARE_PROVIDER_SITE_OTHER): Payer: Self-pay | Admitting: *Deleted

## 2014-11-09 ENCOUNTER — Emergency Department (HOSPITAL_COMMUNITY): Payer: Medicare Other

## 2014-11-09 ENCOUNTER — Emergency Department (HOSPITAL_COMMUNITY)
Admission: EM | Admit: 2014-11-09 | Discharge: 2014-11-09 | Disposition: A | Discharge status: Home / Self Care | Payer: Medicare Other | Attending: Emergency Medicine | Admitting: Emergency Medicine

## 2014-11-09 ENCOUNTER — Encounter (HOSPITAL_COMMUNITY): Payer: Self-pay

## 2014-11-09 DIAGNOSIS — Z7951 Long term (current) use of inhaled steroids: Secondary | ICD-10-CM | POA: Diagnosis not present

## 2014-11-09 DIAGNOSIS — J441 Chronic obstructive pulmonary disease with (acute) exacerbation: Secondary | ICD-10-CM | POA: Diagnosis not present

## 2014-11-09 DIAGNOSIS — N390 Urinary tract infection, site not specified: Secondary | ICD-10-CM | POA: Diagnosis not present

## 2014-11-09 DIAGNOSIS — R0602 Shortness of breath: Secondary | ICD-10-CM | POA: Diagnosis not present

## 2014-11-09 DIAGNOSIS — R0789 Other chest pain: Secondary | ICD-10-CM | POA: Diagnosis not present

## 2014-11-09 DIAGNOSIS — I509 Heart failure, unspecified: Secondary | ICD-10-CM | POA: Insufficient documentation

## 2014-11-09 DIAGNOSIS — E119 Type 2 diabetes mellitus without complications: Secondary | ICD-10-CM | POA: Diagnosis not present

## 2014-11-09 DIAGNOSIS — I10 Essential (primary) hypertension: Secondary | ICD-10-CM | POA: Insufficient documentation

## 2014-11-09 DIAGNOSIS — F419 Anxiety disorder, unspecified: Secondary | ICD-10-CM | POA: Diagnosis not present

## 2014-11-09 DIAGNOSIS — F329 Major depressive disorder, single episode, unspecified: Secondary | ICD-10-CM | POA: Diagnosis not present

## 2014-11-09 DIAGNOSIS — D649 Anemia, unspecified: Secondary | ICD-10-CM | POA: Diagnosis not present

## 2014-11-09 DIAGNOSIS — R0689 Other abnormalities of breathing: Secondary | ICD-10-CM | POA: Diagnosis not present

## 2014-11-09 DIAGNOSIS — N939 Abnormal uterine and vaginal bleeding, unspecified: Secondary | ICD-10-CM | POA: Insufficient documentation

## 2014-11-09 DIAGNOSIS — Z79899 Other long term (current) drug therapy: Secondary | ICD-10-CM | POA: Diagnosis not present

## 2014-11-09 DIAGNOSIS — B9689 Other specified bacterial agents as the cause of diseases classified elsewhere: Secondary | ICD-10-CM | POA: Diagnosis not present

## 2014-11-09 DIAGNOSIS — R0600 Dyspnea, unspecified: Secondary | ICD-10-CM

## 2014-11-09 HISTORY — DX: Heart failure, unspecified: I50.9

## 2014-11-09 LAB — URINALYSIS, ROUTINE W REFLEX MICROSCOPIC
Bilirubin Urine: NEGATIVE
Nitrite: NEGATIVE
PH: 5.5 (ref 5.0–8.0)
Protein, ur: NEGATIVE mg/dL
Urobilinogen, UA: 0.2 mg/dL (ref 0.0–1.0)

## 2014-11-09 LAB — CBC WITH DIFFERENTIAL/PLATELET
Basophils Absolute: 0 10*3/uL (ref 0.0–0.1)
Basophils Relative: 1 % (ref 0–1)
EOS ABS: 0.2 10*3/uL (ref 0.0–0.7)
EOS PCT: 3 % (ref 0–5)
HCT: 40.4 % (ref 36.0–46.0)
Hemoglobin: 14.5 g/dL (ref 12.0–15.0)
LYMPHS ABS: 1.2 10*3/uL (ref 0.7–4.0)
Lymphocytes Relative: 15 % (ref 12–46)
MCH: 31.3 pg (ref 26.0–34.0)
MCHC: 35.9 g/dL (ref 30.0–36.0)
MCV: 87.3 fL (ref 78.0–100.0)
Monocytes Absolute: 0.5 10*3/uL (ref 0.1–1.0)
Monocytes Relative: 7 % (ref 3–12)
Neutro Abs: 5.8 10*3/uL (ref 1.7–7.7)
Neutrophils Relative %: 74 % (ref 43–77)
PLATELETS: 201 10*3/uL (ref 150–400)
RBC: 4.63 MIL/uL (ref 3.87–5.11)
RDW: 13 % (ref 11.5–15.5)
WBC: 7.8 10*3/uL (ref 4.0–10.5)

## 2014-11-09 LAB — BASIC METABOLIC PANEL
Anion gap: 15 (ref 5–15)
BUN: 17 mg/dL (ref 6–23)
CALCIUM: 9.2 mg/dL (ref 8.4–10.5)
CO2: 28 mEq/L (ref 19–32)
CREATININE: 1.03 mg/dL (ref 0.50–1.10)
Chloride: 90 mEq/L — ABNORMAL LOW (ref 96–112)
GFR calc Af Amer: 75 mL/min — ABNORMAL LOW (ref >90)
GFR, EST NON AFRICAN AMERICAN: 65 mL/min — AB (ref >90)
GLUCOSE: 567 mg/dL — AB (ref 70–99)
POTASSIUM: 3.4 meq/L — AB (ref 3.7–5.3)
Sodium: 133 mEq/L — ABNORMAL LOW (ref 137–147)

## 2014-11-09 LAB — URINE MICROSCOPIC-ADD ON

## 2014-11-09 LAB — PRO B NATRIURETIC PEPTIDE: Pro B Natriuretic peptide (BNP): 312.1 pg/mL — ABNORMAL HIGH (ref 0–125)

## 2014-11-09 LAB — CBG MONITORING, ED: GLUCOSE-CAPILLARY: 462 mg/dL — AB (ref 70–99)

## 2014-11-09 LAB — TROPONIN I

## 2014-11-09 LAB — D-DIMER, QUANTITATIVE: D-Dimer, Quant: 0.9 ug/mL-FEU — ABNORMAL HIGH (ref 0.00–0.48)

## 2014-11-09 MED ORDER — IPRATROPIUM-ALBUTEROL 0.5-2.5 (3) MG/3ML IN SOLN
3.0000 mL | Freq: Once | RESPIRATORY_TRACT | Status: AC
  Administered 2014-11-09: 3 mL via RESPIRATORY_TRACT
  Filled 2014-11-09: qty 3

## 2014-11-09 MED ORDER — NITROFURANTOIN MONOHYD MACRO 100 MG PO CAPS
100.0000 mg | ORAL_CAPSULE | Freq: Two times a day (BID) | ORAL | Status: DC
Start: 2014-11-09 — End: 2014-12-19

## 2014-11-09 MED ORDER — ALBUTEROL SULFATE (2.5 MG/3ML) 0.083% IN NEBU
2.5000 mg | INHALATION_SOLUTION | Freq: Once | RESPIRATORY_TRACT | Status: AC
  Administered 2014-11-09: 2.5 mg via RESPIRATORY_TRACT
  Filled 2014-11-09: qty 3

## 2014-11-09 MED ORDER — NITROFURANTOIN MONOHYD MACRO 100 MG PO CAPS
100.0000 mg | ORAL_CAPSULE | Freq: Once | ORAL | Status: AC
  Administered 2014-11-09: 100 mg via ORAL
  Filled 2014-11-09: qty 1

## 2014-11-09 MED ORDER — INSULIN ASPART 100 UNIT/ML ~~LOC~~ SOLN
5.0000 [IU] | Freq: Once | SUBCUTANEOUS | Status: AC
  Administered 2014-11-09: 5 [IU] via INTRAVENOUS
  Filled 2014-11-09: qty 1

## 2014-11-09 MED ORDER — NITROFURANTOIN MACROCRYSTAL 100 MG PO CAPS: 100.0000 mg | ORAL_CAPSULE | Freq: Once | ORAL | Status: DC

## 2014-11-09 MED ORDER — FUROSEMIDE 10 MG/ML IJ SOLN
40.0000 mg | Freq: Once | INTRAMUSCULAR | Status: AC
  Administered 2014-11-09: 40 mg via INTRAVENOUS
  Filled 2014-11-09: qty 4

## 2014-11-09 NOTE — ED Notes (Signed)
Patient verbalizes understanding of discharge instructions, prescription medications, follow up care and home care. Patient ambulatory out of department at this time with family.

## 2014-11-09 NOTE — Discharge Instructions (Signed)
Chest x-ray showed no obvious pneumonia or pulmonary edema. Continue breathing treatments. We will not use prednisone secondary to diabetes. Antibiotic for urinary tract infection. Urine sample will need to be repeated in 10 days.

## 2014-11-09 NOTE — ED Notes (Signed)
Patient states she has CHF and developed decreased urinary output. Patient also states she has congestion with a cough.

## 2014-11-09 NOTE — ED Notes (Signed)
CRITICAL VALUE ALERT  Critical value received:  Glucose 567  Date of notification:  11/09/14  Time of notification:  2055  Critical value read back:Yes.    Nurse who received alert:  Holli Humbles  MD notified (1st page):  cook  Time of first page:  2055  MD notified (2nd page):  Time of second page:  Responding MD:  cook  Time MD responded:  2055

## 2014-11-09 NOTE — ED Provider Notes (Signed)
CSN: [id number]     Arrival date & time 11/09/14  1914 History   First MD Initiated Contact with Patient 11/09/14 1932     Chief Complaint  Patient presents with  . Shortness of Breath     (Consider location/radiation/quality/duration/timing/severity/associated sxs/prior Treatment) Patient is a 43 y.o. female presenting with shortness of breath.  Shortness of Breath ... Cough with congestion starting today. No fever, chills, chest pain. Patient has a history of both COPD and congestive heart failure. She describes decreased urinary output today. No frank peripheral edema. She has used her breathing nebulizer medicine at home with minimal relief. She was able to walk into the emergency department. He was systems positive for slight hematuria but no dysuria  Past Medical History  Diagnosis Date  . COPD (chronic obstructive pulmonary disease)   . Depression   . Hypertension   . Asthma   . Anxiety   . Anemia   . Blood transfusion without reported diagnosis   . Diabetes mellitus without complication     a. Dx 02/2014.  [name] HOCM (hypertrophic obstructive cardiomyopathy)     a. Dx 32992.  . Vaginal bleeding   . SVT (supraventricular tachycardia)     a. Noted on tele during 02/2014 adm for symptomatic anemia.  [name] NSVT (nonsustained ventricular tachycardia)     a. Noted on tele during 02/2014 adm for symptomatic anemia.  . Morbid obesity   . Abnormal CT scan     a. 02/2014: evaluated for dyspnea and initially placed on Xarelto for PE because radiologist could not exclude small peripheral pulmonary emboli because of limited contrast. However, after admission for ++vaginal bleeding several days later, it was felt she did NOT have PE - underwent further testing with neg VQ 03/06/14, neg LE duplex.  . CHF (congestive heart failure)    History reviewed. No pertinent past surgical history. Family History  Problem Relation Age of Onset  . CAD Neg Hx     No family history of EARLY CAD   History   Substance Use Topics  . Smoking status: Never Smoker   . Smokeless tobacco: Never Used  . Alcohol Use: No   OB History    Gravida Para Term Preterm AB TAB SAB Ectopic Multiple Living            0     Review of Systems  Respiratory: Positive for shortness of breath.   All other systems reviewed and are negative.     Allergies  Review of patient's allergies indicates no known allergies.  Home Medications   Prior to Admission medications   Medication Sig Start Date End Date Taking? Authorizing Provider  buPROPion (WELLBUTRIN XL) 150 MG 24 hr tablet Take 1 tablet (150 mg total) by mouth daily. 05/27/14  Yes Daniel J Angiulli, PA-C  fluticasone (FLOVENT HFA) 110 MCG/ACT inhaler Inhale 2 puffs into the lungs 2 (two) times daily. 03/03/14  Yes Kathie Dike, MD  levothyroxine (SYNTHROID, LEVOTHROID) 75 MCG tablet Take 75 mcg by mouth daily. 10/16/14  Yes Historical Provider, MD  megestrol (MEGACE) 40 MG tablet TAKE FOUR TABLETS BY MOUTH ONCE DAILY Patient taking differently: Take 160 mg by mouth daily.  11/02/14  Yes Florian Buff, MD  metFORMIN (GLUCOPHAGE) 500 MG tablet Take 1 tablet (500 mg total) by mouth 2 (two) times daily with a meal. 05/27/14  Yes Daniel J Angiulli, PA-C  metoprolol tartrate (LOPRESSOR) 25 MG tablet Take 1 tablet (25 mg total) by mouth 2 (two) times daily.  05/27/14  Yes Daniel J Angiulli, PA-C  potassium chloride SA (K-DUR,KLOR-CON) 20 MEQ tablet Take 2 tablets dailly (40 meq) 08/19/14  Yes Lendon Colonel, NP  pravastatin (PRAVACHOL) 20 MG tablet Take 1 tablet (20 mg total) by mouth daily. 05/27/14  Yes Daniel J Angiulli, PA-C  spironolactone (ALDACTONE) 25 MG tablet Take 0.5 tablets (12.5 mg total) by mouth daily. 08/19/14  Yes Lendon Colonel, NP  torsemide (DEMADEX) 20 MG tablet Take 1 tablet (20 mg total) by mouth 2 (two) times daily. Patient taking differently: Take 40 mg by mouth 2 (two) times daily.  08/19/14  Yes Lendon Colonel, NP  albuterol  (PROVENTIL HFA;VENTOLIN HFA) 108 (90 BASE) MCG/ACT inhaler Inhale 2 puffs into the lungs every 6 (six) hours as needed for wheezing or shortness of breath.    Historical Provider, MD  ferrous sulfate 325 (65 FE) MG tablet Take 1 tablet (325 mg total) by mouth 2 (two) times daily with a meal. Patient not taking: Reported on 11/02/2014 05/27/14   Lavon Paganini Angiulli, PA-C  nitrofurantoin, macrocrystal-monohydrate, (MACROBID) 100 MG capsule Take 1 capsule (100 mg total) by mouth 2 (two) times daily. X 7 days 11/09/14   Nat Christen, MD   BP 140/80 mmHg  Pulse 93  Temp(Src) 97.5 F (36.4 C) (Oral)  Resp 18  Ht 5\' 9"  (1.753 m)  Wt 367 lb (166.47 kg)  BMI 54.17 kg/m2  SpO2 99%  LMP  Physical Exam  Constitutional: She is oriented to person, place, and time.  Morbidly obese, no obvious dyspnea  HENT:  Head: Normocephalic and atraumatic.  Eyes: Conjunctivae and EOM are normal. Pupils are equal, round, and reactive to light.  Neck: Normal range of motion. Neck supple.  Cardiovascular: Normal rate, regular rhythm and normal heart sounds.   Pulmonary/Chest: Effort normal.  Minimal expiratory wheezes bilateral.  Abdominal: Soft. Bowel sounds are normal.  Musculoskeletal: Normal range of motion.  Neurological: She is alert and oriented to person, place, and time.  Skin: Skin is warm and dry.  Psychiatric: She has a normal mood and affect. Her behavior is normal.  Nursing note and vitals reviewed.   ED Course  Procedures (including critical care time) Labs Review Labs Reviewed  BASIC METABOLIC PANEL - Abnormal; Notable for the following:    Sodium 133 (*)    Potassium 3.4 (*)    Chloride 90 (*)    Glucose, Bld 567 (*)    GFR calc non Af Amer 65 (*)    GFR calc Af Amer 75 (*)    All other components within normal limits  PRO B NATRIURETIC PEPTIDE - Abnormal; Notable for the following:    Pro B Natriuretic peptide (BNP) 312.1 (*)    All other components within normal limits  D-DIMER,  QUANTITATIVE - Abnormal; Notable for the following:    D-Dimer, Quant 0.90 (*)    All other components within normal limits  URINALYSIS, ROUTINE W REFLEX MICROSCOPIC - Abnormal; Notable for the following:    APPearance HAZY (*)    Specific Gravity, Urine <1.005 (*)    Glucose, UA >1000 (*)    Hgb urine dipstick LARGE (*)    Ketones, ur TRACE (*)    Leukocytes, UA TRACE (*)    All other components within normal limits  URINE MICROSCOPIC-ADD ON - Abnormal; Notable for the following:    Squamous Epithelial / LPF FEW (*)    All other components within normal limits  URINE CULTURE  CBC WITH DIFFERENTIAL  TROPONIN I  CBG MONITORING, ED    Imaging Review Dg Chest Port 1 View  11/09/2014   CLINICAL DATA:  Difficulty breathing and chest pressure  EXAM: PORTABLE CHEST - 1 VIEW  COMPARISON:  Chest radiograph July 25, 2014 and chest CT July 25, 2014  FINDINGS: Lungs are clear. Heart size and pulmonary vascularity are normal. No pneumothorax. No adenopathy. No bone lesions.  IMPRESSION: No edema or consolidation.   Electronically Signed   By: Lowella Grip M.D.   On: 11/09/2014 20:33     EKG Interpretation None      MDM   Final diagnoses:  Dyspnea  UTI (lower urinary tract infection)    Patient is in no acute distress. Chest x-ray shows neither pulmonary edema nor pneumonia. She has had several CT angio's of the chest in the past. She feels better after nebulizer treatments. Glucose is elevated, but she is not in DKA. Urinalysis shows minor evidence of infection. Will start Macrobid and culture urine.    Nat Christen, MD 11/09/14 [phone]

## 2014-11-09 NOTE — ED Notes (Signed)
I have CHF and today I noticed that I have not been going to the bathroom to urinate at much. I am having a hard time breathing per pt.

## 2014-11-10 ENCOUNTER — Ambulatory Visit: Payer: Medicare Other | Admitting: Obstetrics & Gynecology

## 2014-11-11 LAB — URINE CULTURE: Colony Count: 15000

## 2014-11-17 ENCOUNTER — Encounter: Payer: Self-pay | Admitting: Obstetrics & Gynecology

## 2014-11-17 ENCOUNTER — Ambulatory Visit (INDEPENDENT_AMBULATORY_CARE_PROVIDER_SITE_OTHER): Payer: Medicare Other | Admitting: Obstetrics & Gynecology

## 2014-11-17 VITALS — BP 160/60 | Ht 69.0 in | Wt 376.0 lb

## 2014-11-17 DIAGNOSIS — N764 Abscess of vulva: Secondary | ICD-10-CM | POA: Diagnosis not present

## 2014-11-17 MED ORDER — SULFAMETHOXAZOLE-TRIMETHOPRIM 800-160 MG PO TABS: 1.0000 | ORAL_TABLET | Freq: Two times a day (BID) | ORAL | Status: DC

## 2014-11-17 MED ORDER — SILVER SULFADIAZINE 1 % EX CREA: TOPICAL_CREAM | CUTANEOUS | Status: DC

## 2014-11-17 NOTE — Progress Notes (Signed)
Patient ID: Erin Sexton, female   DOB: 1971/05/20, 43 y.o.   MRN: [id number] Chief Complaint  Patient presents with  . lump on labia    HPI Pt noticed left labial lesion about 3-4 days sago, has had 1 previous time much smaller Pt is diabetic  ROS No burning with urination, frequency or urgency No nausea, vomiting or diarrhea Nor fever chills or other constitutional symptoms   Blood pressure 160/60, height 5\' 9"  (1.753 m), weight 376 lb (170.552 kg).  EXAM Abdomen:       Vulva:            Left labial erythema and induration with appearance concerning for MRSA of the vulva Vagina:           Cervix:            Uterus:           Adnexa:          Rectal:            Hemoccult:                               Assessment/Plan:  Vulvar boil, MRSA:  bactim DS bid + silvadene and local care baths BID

## 2014-11-21 DIAGNOSIS — E780 Pure hypercholesterolemia: Secondary | ICD-10-CM | POA: Diagnosis not present

## 2014-11-21 DIAGNOSIS — I10 Essential (primary) hypertension: Secondary | ICD-10-CM | POA: Diagnosis not present

## 2014-11-21 DIAGNOSIS — E039 Hypothyroidism, unspecified: Secondary | ICD-10-CM | POA: Diagnosis not present

## 2014-11-21 DIAGNOSIS — E559 Vitamin D deficiency, unspecified: Secondary | ICD-10-CM | POA: Diagnosis not present

## 2014-11-21 DIAGNOSIS — E119 Type 2 diabetes mellitus without complications: Secondary | ICD-10-CM | POA: Diagnosis not present

## 2014-11-22 DIAGNOSIS — E039 Hypothyroidism, unspecified: Secondary | ICD-10-CM | POA: Diagnosis not present

## 2014-11-22 DIAGNOSIS — E119 Type 2 diabetes mellitus without complications: Secondary | ICD-10-CM | POA: Diagnosis not present

## 2014-11-22 DIAGNOSIS — I10 Essential (primary) hypertension: Secondary | ICD-10-CM | POA: Diagnosis not present

## 2014-11-24 ENCOUNTER — Encounter: Payer: Self-pay | Admitting: Obstetrics & Gynecology

## 2014-11-24 ENCOUNTER — Ambulatory Visit: Payer: Medicare Other | Admitting: Obstetrics & Gynecology

## 2014-12-04 ENCOUNTER — Emergency Department (HOSPITAL_COMMUNITY)
Admission: EM | Admit: 2014-12-04 | Discharge: 2014-12-05 | Disposition: A | Discharge status: Home / Self Care | Payer: Medicare Other | Attending: Emergency Medicine | Admitting: Emergency Medicine

## 2014-12-04 ENCOUNTER — Encounter (HOSPITAL_COMMUNITY): Payer: Self-pay

## 2014-12-04 DIAGNOSIS — Z792 Long term (current) use of antibiotics: Secondary | ICD-10-CM | POA: Diagnosis not present

## 2014-12-04 DIAGNOSIS — H8110 Benign paroxysmal vertigo, unspecified ear: Secondary | ICD-10-CM | POA: Insufficient documentation

## 2014-12-04 DIAGNOSIS — F329 Major depressive disorder, single episode, unspecified: Secondary | ICD-10-CM | POA: Insufficient documentation

## 2014-12-04 DIAGNOSIS — Z79899 Other long term (current) drug therapy: Secondary | ICD-10-CM | POA: Diagnosis not present

## 2014-12-04 DIAGNOSIS — I10 Essential (primary) hypertension: Secondary | ICD-10-CM | POA: Diagnosis not present

## 2014-12-04 DIAGNOSIS — J449 Chronic obstructive pulmonary disease, unspecified: Secondary | ICD-10-CM | POA: Insufficient documentation

## 2014-12-04 DIAGNOSIS — E876 Hypokalemia: Secondary | ICD-10-CM | POA: Insufficient documentation

## 2014-12-04 DIAGNOSIS — F419 Anxiety disorder, unspecified: Secondary | ICD-10-CM | POA: Insufficient documentation

## 2014-12-04 DIAGNOSIS — I509 Heart failure, unspecified: Secondary | ICD-10-CM | POA: Insufficient documentation

## 2014-12-04 DIAGNOSIS — Z862 Personal history of diseases of the blood and blood-forming organs and certain disorders involving the immune mechanism: Secondary | ICD-10-CM | POA: Diagnosis not present

## 2014-12-04 DIAGNOSIS — H8113 Benign paroxysmal vertigo, bilateral: Secondary | ICD-10-CM | POA: Diagnosis not present

## 2014-12-04 DIAGNOSIS — R42 Dizziness and giddiness: Secondary | ICD-10-CM | POA: Diagnosis present

## 2014-12-04 DIAGNOSIS — N390 Urinary tract infection, site not specified: Secondary | ICD-10-CM | POA: Diagnosis not present

## 2014-12-04 LAB — URINALYSIS, ROUTINE W REFLEX MICROSCOPIC
Bilirubin Urine: NEGATIVE
Glucose, UA: 500 mg/dL — AB
Ketones, ur: NEGATIVE mg/dL
Nitrite: NEGATIVE
PROTEIN: NEGATIVE mg/dL
Specific Gravity, Urine: <1.005 — ABNORMAL LOW (ref 1.005–1.030)
Urobilinogen, UA: 0.2 mg/dL (ref 0.0–1.0)
pH: 5.5 (ref 5.0–8.0)

## 2014-12-04 LAB — URINE MICROSCOPIC-ADD ON

## 2014-12-04 NOTE — ED Notes (Signed)
Patient c/o of dizziness this am which has continued throughout the day with episodes becoming more frequent. Each episode lasting "a few minutes". C/o of blurred vision which started "weeks ago".

## 2014-12-04 NOTE — ED Provider Notes (Signed)
CSN: [id number]     Arrival date & time 12/04/14  2123 History   First MD Initiated Contact with Patient 12/04/14 2314     Chief Complaint  Patient presents with  . Dizziness     (Consider location/radiation/quality/duration/timing/severity/associated sxs/prior Treatment) HPI Comments: Patient is a 43 year old female who presents to the emergency department with plate of dizziness.  The patient states that this problem started approximately 8:30 AM today. She describes it as a sensation of the room spinning. She has had some nausea, but no actual vomiting. She complains of blurred vision with this. She has not had any loss of consciousness. This episodes, and go, but seem to be increasing in their frequency, and last for several minutes. She's had multiple of them today. It is of note that she has had this problem in the past, but only wanted to episodes but today she has had multiple episodes. She also complains of her years hurting, she has not had any drainage or discharge that she is aware of. And she has not had any high fever. There's been no injury to the head or ears. The patient denies any recent changes in medication. It is of note that the patient is a non-insulin requiring diabetic. Her glucose levels on yesterday were 319, this morning it was 213, and these are within the range of her usual numbers. This problem is made better when she sits still and closes her eyes, is made worse when she changes positions. She has not taken anything for the dizziness.  The history is provided by the patient.    Past Medical History  Diagnosis Date  . COPD (chronic obstructive pulmonary disease)   . Depression   . Hypertension   . Asthma   . Anxiety   . Anemia   . Blood transfusion without reported diagnosis   . Diabetes mellitus without complication     a. Dx 02/2014.  [name] HOCM (hypertrophic obstructive cardiomyopathy)     a. Dx 50354.  . Vaginal bleeding   . SVT (supraventricular  tachycardia)     a. Noted on tele during 02/2014 adm for symptomatic anemia.  [name] NSVT (nonsustained ventricular tachycardia)     a. Noted on tele during 02/2014 adm for symptomatic anemia.  . Morbid obesity   . Abnormal CT scan     a. 02/2014: evaluated for dyspnea and initially placed on Xarelto for PE because radiologist could not exclude small peripheral pulmonary emboli because of limited contrast. However, after admission for ++vaginal bleeding several days later, it was felt she did NOT have PE - underwent further testing with neg VQ 03/06/14, neg LE duplex.  . CHF (congestive heart failure)    History reviewed. No pertinent past surgical history. Family History  Problem Relation Age of Onset  . CAD Neg Hx     No family history of EARLY CAD  . Diabetes Mother   . Arthritis Mother   . Other Mother     WPW   History  Substance Use Topics  . Smoking status: Never Smoker   . Smokeless tobacco: Never Used  . Alcohol Use: No   OB History    Gravida Para Term Preterm AB TAB SAB Ectopic Multiple Living            0     Review of Systems  Constitutional: Negative for activity change.       All ROS Neg except as noted in HPI  Eyes: Negative for photophobia and discharge.  Respiratory: Positive for shortness of breath. Negative for cough and wheezing.   Cardiovascular: Positive for palpitations. Negative for chest pain.  Gastrointestinal: Negative for abdominal pain and blood in stool.  Genitourinary: Negative for dysuria, frequency and hematuria.  Musculoskeletal: Negative for back pain, arthralgias and neck pain.  Skin: Negative.   Neurological: Positive for dizziness. Negative for seizures and speech difficulty.  Psychiatric/Behavioral: Negative for hallucinations and confusion. The patient is nervous/anxious.        Depression      Allergies  Review of patient's allergies indicates no known allergies.  Home Medications   Prior to Admission medications   Medication Sig  Start Date End Date Taking? Authorizing Provider  albuterol (PROVENTIL HFA;VENTOLIN HFA) 108 (90 BASE) MCG/ACT inhaler Inhale 2 puffs into the lungs every 6 (six) hours as needed for wheezing or shortness of breath.    Historical Provider, MD  buPROPion (WELLBUTRIN XL) 150 MG 24 hr tablet Take 1 tablet (150 mg total) by mouth daily. 05/27/14   Lavon Paganini Angiulli, PA-C  fluticasone (FLOVENT HFA) 110 MCG/ACT inhaler Inhale 2 puffs into the lungs 2 (two) times daily. 03/03/14   Kathie Dike, MD  levothyroxine (SYNTHROID, LEVOTHROID) 75 MCG tablet Take 75 mcg by mouth daily. 10/16/14   Historical Provider, MD  megestrol (MEGACE) 40 MG tablet TAKE FOUR TABLETS BY MOUTH ONCE DAILY Patient taking differently: Take 160 mg by mouth daily.  11/02/14   Florian Buff, MD  metFORMIN (GLUCOPHAGE) 500 MG tablet Take 1 tablet (500 mg total) by mouth 2 (two) times daily with a meal. 05/27/14   Lavon Paganini Angiulli, PA-C  metoprolol (LOPRESSOR) 50 MG tablet 50 mg 2 (two) times daily.  11/16/14   Historical Provider, MD  nitrofurantoin, macrocrystal-monohydrate, (MACROBID) 100 MG capsule Take 1 capsule (100 mg total) by mouth 2 (two) times daily. X 7 days 11/09/14   Nat Christen, MD  potassium chloride SA (K-DUR,KLOR-CON) 20 MEQ tablet Take 2 tablets dailly (40 meq) 08/19/14   Lendon Colonel, NP  pravastatin (PRAVACHOL) 20 MG tablet Take 1 tablet (20 mg total) by mouth daily. 05/27/14   Lavon Paganini Angiulli, PA-C  silver sulfADIAZINE (SILVADENE) 1 % cream Use to area BID 11/17/14   Florian Buff, MD  spironolactone (ALDACTONE) 25 MG tablet Take 0.5 tablets (12.5 mg total) by mouth daily. 08/19/14   Lendon Colonel, NP  sulfamethoxazole-trimethoprim (BACTRIM DS) 800-160 MG per tablet Take 1 tablet by mouth 2 (two) times daily. 11/17/14   Florian Buff, MD  torsemide (DEMADEX) 20 MG tablet Take 20 mg by mouth 2 (two) times daily. Takes 2 tabs BID.    Historical Provider, MD   BP 129/62 mmHg  Pulse 79  Temp(Src) 98.6 F (37 C)  (Oral)  Resp 20  Ht 5\' 9"  (1.753 m)  Wt 341 lb (154.677 kg)  BMI 50.33 kg/m2  SpO2 99% Physical Exam  Constitutional: She is oriented to person, place, and time. She appears well-developed and well-nourished.  Non-toxic appearance.  HENT:  Head: Normocephalic.  Right Ear: Tympanic membrane and external ear normal.  Left Ear: Tympanic membrane and external ear normal.  Mouth/Throat: Oropharynx is clear and moist.  External auditory canals are occluded with cerumen.  Eyes: EOM and lids are normal. Pupils are equal, round, and reactive to light.  Neck: Normal range of motion. Neck supple. Carotid bruit is not present.  Cardiovascular: Normal rate, regular rhythm, normal heart sounds, intact distal pulses and normal pulses.  Exam reveals no  gallop and no friction rub.   Pulmonary/Chest: Breath sounds normal. No respiratory distress.  Abdominal: Soft. Bowel sounds are normal. There is no tenderness. There is no guarding.  Musculoskeletal: Normal range of motion.  Lymphadenopathy:       Head (right side): No submandibular adenopathy present.       Head (left side): No submandibular adenopathy present.    She has no cervical adenopathy.  Neurological: She is alert and oriented to person, place, and time. She has normal strength. No cranial nerve deficit or sensory deficit. She exhibits normal muscle tone. Coordination normal.  Skin: Skin is warm and dry.  Psychiatric: She has a normal mood and affect. Her speech is normal.  Nursing note and vitals reviewed.   ED Course  Procedures (including critical care time) Labs Review Labs Reviewed  URINALYSIS, ROUTINE W REFLEX MICROSCOPIC - Abnormal; Notable for the following:    APPearance CLOUDY (*)    Specific Gravity, Urine <1.005 (*)    Glucose, UA 500 (*)    Hgb urine dipstick MODERATE (*)    Leukocytes, UA MODERATE (*)    All other components within normal limits  URINE MICROSCOPIC-ADD ON - Abnormal; Notable for the following:     Bacteria, UA FEW (*)    All other components within normal limits  CBC WITH DIFFERENTIAL  COMPREHENSIVE METABOLIC PANEL  CBG MONITORING, ED    Imaging Review No results found.   EKG Interpretation None      MDM  Vital signs are within normal limits. Pulse oximetry is 99% on room air. Within normal limits by my interpretation.  Complete blood count is well within normal limits. Urinalysis shows a cloudy specimen with a glucose elevated at 500, hemoglobin is moderate in the urine, moderate leukocyte esterase, 10-20 white blood cells per high-powered field. Urine is sent to the lab for culture.   Comprehensive metabolic panel shows the potassium to be low at 3.1, the CO2 be slightly elevated at 34, glucose is elevated at 276. The remainder of the comprehensive metabolic panel is well within normal limits.   Suspect the patient has benign positional vertigo, urinary tract infection, and hypokalemia. The potassium has been replaced orally while in the emergency department. The patient will be treated with Keflex for the urinary tract infection. Patient is to follow-up with her primary physician next week for recheck. Prescription for Antivert 25 mg 3 times daily has been ordered for the patient.    Final diagnoses:  None    **I have reviewed nursing notes, vital signs, and all appropriate lab and imaging results for this patient.Lenox Ahr, PA-C 12/05/14 0140  Ephraim Hamburger, MD 12/08/14 [phone]

## 2014-12-05 LAB — CBC WITH DIFFERENTIAL/PLATELET
BASOS ABS: 0 10*3/uL (ref 0.0–0.1)
BASOS PCT: 0 % (ref 0–1)
Eosinophils Absolute: 0.2 10*3/uL (ref 0.0–0.7)
Eosinophils Relative: 3 % (ref 0–5)
HEMATOCRIT: 40.4 % (ref 36.0–46.0)
Hemoglobin: 13.5 g/dL (ref 12.0–15.0)
LYMPHS PCT: 32 % (ref 12–46)
Lymphs Abs: 2.6 10*3/uL (ref 0.7–4.0)
MCH: 30.1 pg (ref 26.0–34.0)
MCHC: 33.4 g/dL (ref 30.0–36.0)
MCV: 90.2 fL (ref 78.0–100.0)
MONO ABS: 0.4 10*3/uL (ref 0.1–1.0)
Monocytes Relative: 5 % (ref 3–12)
Neutro Abs: 4.8 10*3/uL (ref 1.7–7.7)
Neutrophils Relative %: 60 % (ref 43–77)
PLATELETS: 261 10*3/uL (ref 150–400)
RBC: 4.48 MIL/uL (ref 3.87–5.11)
RDW: 12.7 % (ref 11.5–15.5)
WBC: 8 10*3/uL (ref 4.0–10.5)

## 2014-12-05 LAB — COMPREHENSIVE METABOLIC PANEL
ALBUMIN: 3.6 g/dL (ref 3.5–5.2)
ALT: 27 U/L (ref 0–35)
AST: 22 U/L (ref 0–37)
Alkaline Phosphatase: 102 U/L (ref 39–117)
Anion gap: 8 (ref 5–15)
BUN: 16 mg/dL (ref 6–23)
CALCIUM: 8.6 mg/dL (ref 8.4–10.5)
CO2: 34 mmol/L — ABNORMAL HIGH (ref 19–32)
CREATININE: 0.88 mg/dL (ref 0.50–1.10)
Chloride: 99 mEq/L (ref 96–112)
GFR calc Af Amer: >90 mL/min (ref >90)
GFR calc non Af Amer: 78 mL/min — ABNORMAL LOW (ref >90)
Glucose, Bld: 276 mg/dL — ABNORMAL HIGH (ref 70–99)
Potassium: 3.1 mmol/L — ABNORMAL LOW (ref 3.5–5.1)
SODIUM: 141 mmol/L (ref 135–145)
Total Bilirubin: <0.1 mg/dL — ABNORMAL LOW (ref 0.3–1.2)
Total Protein: 6.5 g/dL (ref 6.0–8.3)

## 2014-12-05 LAB — CBG MONITORING, ED: GLUCOSE-CAPILLARY: 322 mg/dL — AB (ref 70–99)

## 2014-12-05 MED ORDER — MECLIZINE HCL 12.5 MG PO TABS
25.0000 mg | ORAL_TABLET | Freq: Once | ORAL | Status: AC
  Administered 2014-12-05: 25 mg via ORAL
  Filled 2014-12-05: qty 2

## 2014-12-05 MED ORDER — MECLIZINE HCL 25 MG PO TABS: 25.0000 mg | ORAL_TABLET | Freq: Three times a day (TID) | ORAL | Status: DC | PRN

## 2014-12-05 MED ORDER — POTASSIUM CHLORIDE CRYS ER 20 MEQ PO TBCR
40.0000 meq | EXTENDED_RELEASE_TABLET | Freq: Once | ORAL | Status: AC
  Administered 2014-12-05: 40 meq via ORAL
  Filled 2014-12-05: qty 2

## 2014-12-05 MED ORDER — CEPHALEXIN 500 MG PO CAPS
500.0000 mg | ORAL_CAPSULE | Freq: Once | ORAL | Status: AC
  Administered 2014-12-05: 500 mg via ORAL
  Filled 2014-12-05: qty 1

## 2014-12-05 MED ORDER — CEPHALEXIN 500 MG PO CAPS: 500.0000 mg | ORAL_CAPSULE | Freq: Four times a day (QID) | ORAL | Status: DC

## 2014-12-05 NOTE — Discharge Instructions (Signed)
Benign Positional Vertigo Vertigo means you feel like you or your surroundings are moving when they are not. Benign positional vertigo is the most common form of vertigo. Benign means that the cause of your condition is not serious. Benign positional vertigo is more common in older adults. CAUSES  Benign positional vertigo is the result of an upset in the labyrinth system. This is an area in the middle ear that helps control your balance. This may be caused by a viral infection, head injury, or repetitive motion. However, often no specific cause is found. SYMPTOMS  Symptoms of benign positional vertigo occur when you move your head or eyes in different directions. Some of the symptoms may include:  Loss of balance and falls.  Vomiting.  Blurred vision.  Dizziness.  Nausea.  Involuntary eye movements (nystagmus). DIAGNOSIS  Benign positional vertigo is usually diagnosed by physical exam. If the specific cause of your benign positional vertigo is unknown, your caregiver may perform imaging tests, such as magnetic resonance imaging (MRI) or computed tomography (CT). TREATMENT  Your caregiver may recommend movements or procedures to correct the benign positional vertigo. Medicines such as meclizine, benzodiazepines, and medicines for nausea may be used to treat your symptoms. In rare cases, if your symptoms are caused by certain conditions that affect the inner ear, you may need surgery. HOME CARE INSTRUCTIONS   Follow your caregiver's instructions.  Move slowly. Do not make sudden body or head movements.  Avoid driving.  Avoid operating heavy machinery.  Avoid performing any tasks that would be dangerous to you or others during a vertigo episode.  Drink enough fluids to keep your urine clear or pale yellow. SEEK IMMEDIATE MEDICAL CARE IF:   You develop problems with walking, weakness, numbness, or using your arms, hands, or legs.  You have difficulty speaking.  You develop  severe headaches.  Your nausea or vomiting continues or gets worse.  You develop visual changes.  Your family or friends notice any behavioral changes.  Your condition gets worse.  You have a fever.  You develop a stiff neck or sensitivity to light. MAKE SURE YOU:   Understand these instructions.  Will watch your condition.  Will get help right away if you are not doing well or get worse. Document Released: 09/02/2006 Document Revised: 02/17/2012 Document Reviewed: 08/15/2011 [facility] Patient Information 2015 [place], [place]. This information is not intended to replace advice given to you by your health care provider. Make sure you discuss any questions you have with your health care provider.  Urinary Tract Infection A urinary tract infection (UTI) can occur any place along the urinary tract. The tract includes the kidneys, ureters, bladder, and urethra. A type of germ called bacteria often causes a UTI. UTIs are often helped with antibiotic medicine.  HOME CARE   If given, take antibiotics as told by your doctor. Finish them even if you start to feel better.  Drink enough fluids to keep your pee (urine) clear or pale yellow.  Avoid tea, drinks with caffeine, and bubbly (carbonated) drinks.  Pee often. Avoid holding your pee in for a long time.  Pee before and after having sex (intercourse).  Wipe from front to back after you poop (bowel movement) if you are a woman. Use each tissue only once. GET HELP RIGHT AWAY IF:   You have back pain.  You have lower belly (abdominal) pain.  You have chills.  You feel sick to your stomach (nauseous).  You throw up (vomit).  Your  burning or discomfort with peeing does not go away.  You have a fever.  Your symptoms are not better in 3 days. MAKE SURE YOU:   Understand these instructions.  Will watch your condition.  Will get help right away if you are not doing well or get worse. Document Released: 05/13/2008 Document  Revised: 08/19/2012 Document Reviewed: 06/25/2012 [facility] Patient Information 2015 [place], [place]. This information is not intended to replace advice given to you by your health care provider. Make sure you discuss any questions you have with your health care provider.  Hypokalemia Hypokalemia means that the amount of potassium in the blood is lower than normal.Potassium is a chemical, called an electrolyte, that helps regulate the amount of fluid in the body. It also stimulates muscle contraction and helps nerves function properly.Most of the body's potassium is inside of cells, and only a very small amount is in the blood. Because the amount in the blood is so small, minor changes can be life-threatening. CAUSES  Antibiotics.  Diarrhea or vomiting.  Using laxatives too much, which can cause diarrhea.  Chronic kidney disease.  Water pills (diuretics).  Eating disorders (bulimia).  Low magnesium level.  Sweating a lot. SIGNS AND SYMPTOMS  Weakness.  Constipation.  Fatigue.  Muscle cramps.  Mental confusion.  Skipped heartbeats or irregular heartbeat (palpitations).  Tingling or numbness. DIAGNOSIS  Your health care provider can diagnose hypokalemia with blood tests. In addition to checking your potassium level, your health care provider may also check other lab tests. TREATMENT Hypokalemia can be treated with potassium supplements taken by mouth or adjustments in your current medicines. If your potassium level is very low, you may need to get potassium through a vein (IV) and be monitored in the hospital. A diet high in potassium is also helpful. Foods high in potassium are:  Nuts, such as peanuts and pistachios.  Seeds, such as sunflower seeds and pumpkin seeds.  Peas, lentils, and lima beans.  Whole grain and bran cereals and breads.  Fresh fruit and vegetables, such as apricots, avocado, bananas, cantaloupe, kiwi, oranges, tomatoes, asparagus, and  potatoes.  Orange and tomato juices.  Red meats.  Fruit yogurt. HOME CARE INSTRUCTIONS  Take all medicines as prescribed by your health care provider.  Maintain a healthy diet by including nutritious food, such as fruits, vegetables, nuts, whole grains, and lean meats.  If you are taking a laxative, be sure to follow the directions on the label. SEEK MEDICAL CARE IF:  Your weakness gets worse.  You feel your heart pounding or racing.  You are vomiting or having diarrhea.  You are diabetic and having trouble keeping your blood glucose in the normal range. SEEK IMMEDIATE MEDICAL CARE IF:  You have chest pain, shortness of breath, or dizziness.  You are vomiting or having diarrhea for more than 2 days.  You faint. MAKE SURE YOU:   Understand these instructions.  Will watch your condition.  Will get help right away if you are not doing well or get worse. Document Released: 11/25/2005 Document Revised: 09/15/2013 Document Reviewed: 05/28/2013 [facility] Patient Information 2015 [place], [place]. This information is not intended to replace advice given to you by your health care provider. Make sure you discuss any questions you have with your health care provider.

## 2014-12-05 NOTE — ED Notes (Signed)
Discharge instructions given, pt demonstrated teach back and verbal understanding. No concerns voiced.  

## 2014-12-06 LAB — URINE CULTURE: SPECIAL REQUESTS: NORMAL

## 2014-12-13 DIAGNOSIS — E1165 Type 2 diabetes mellitus with hyperglycemia: Secondary | ICD-10-CM | POA: Diagnosis not present

## 2014-12-19 ENCOUNTER — Encounter (INDEPENDENT_AMBULATORY_CARE_PROVIDER_SITE_OTHER): Payer: Self-pay | Admitting: Internal Medicine

## 2014-12-19 ENCOUNTER — Ambulatory Visit (INDEPENDENT_AMBULATORY_CARE_PROVIDER_SITE_OTHER): Payer: Medicare Other | Admitting: Internal Medicine

## 2014-12-19 VITALS — BP 120/60 | HR 84 | Temp 97.0°F | Ht 69.0 in | Wt 377.8 lb

## 2014-12-19 DIAGNOSIS — R197 Diarrhea, unspecified: Secondary | ICD-10-CM | POA: Diagnosis not present

## 2014-12-19 NOTE — Patient Instructions (Addendum)
Probiotic. GI pathogen. Further recommendation to follow.  Probiotic. Imodium one in am and pm

## 2014-12-19 NOTE — Progress Notes (Addendum)
Subjective:    Patient ID: Erin Sexton, female    DOB: 1971/11/19, 44 y.o.   MRN: [id number]  HPI Referred to our office by Dr. Anastasio Champion for rectal bleeding. She tells me she has frequent diarrhea. She thinks she had a hemorrhoid that has been bleeding. She has episodes of diarrhea about every other day. She says her rectal area was raw. When she has the diarrhea, she may have about 4 times day. She tells me her stools are never solid. Stools are sometimes yellow in color. Symptoms for about a month. Before Christmas, her stools were normal. She was having one-two a day.  She has been on antibiotics. Treated with antibiotics in December for a UTI and treated with Macrobid x 7 days.  She was treated again for a UTI and placed on Keflex 500mg  QID x 5 days.  In November she was treated for a sinusitis with Augment. She has had one stool today and it was loose. Foul smelling.  Appetite is good. She is trying to diet. Recently diagnosed in December with DM2.  Last month, she says the water in the toilet would turn red. Now they is less blood.  No family hx of colon cancer. No NSAID's or Advil.  Diagnosed in June of last year with CHF. 02/2014 Echo: EF 65-70  CBC Latest Ref Rng 12/04/2014 11/09/2014 07/25/2014  WBC 4.0 - 10.5 K/uL 8.0 7.8 10.4  Hemoglobin 12.0 - 15.0 g/dL 13.5 14.5 13.3  Hematocrit 36.0 - 46.0 % 40.4 40.4 39.8  Platelets 150 - 400 K/uL 261 201 261    Urinalysis    Component Value Date/Time   COLORURINE YELLOW 12/04/2014 2300   APPEARANCEUR CLOUDY* 12/04/2014 2300   LABSPEC <1.005* 12/04/2014 2300   PHURINE 5.5 12/04/2014 2300   GLUCOSEU 500* 12/04/2014 2300   HGBUR MODERATE* 12/04/2014 2300   BILIRUBINUR NEGATIVE 12/04/2014 2300   KETONESUR NEGATIVE 12/04/2014 2300   PROTEINUR NEGATIVE 12/04/2014 2300   UROBILINOGEN 0.2 12/04/2014 2300   NITRITE NEGATIVE 12/04/2014 2300   LEUKOCYTESUR MODERATE* 12/04/2014 2300   CMP Latest Ref Rng 12/04/2014 11/09/2014 08/26/2014   Glucose 70 - 99 mg/dL 276(H) 567(HH) 189(H)  BUN 6 - 23 mg/dL 16 17 26(H)  Creatinine 0.50 - 1.10 mg/dL 0.88 1.03 1.16(H)  Sodium 135 - 145 mmol/L 141 133(L) 140  Potassium 3.5 - 5.1 mmol/L 3.1(L) 3.4(L) 4.1  Chloride 96 - 112 mEq/L 99 90(L) 98  CO2 19 - 32 mmol/L 34(H) 28 30  Calcium 8.4 - 10.5 mg/dL 8.6 9.2 9.8  Total Protein 6.0 - 8.3 g/dL 6.5 - -  Total Bilirubin 0.3 - 1.2 mg/dL <0.1(L) - -  Alkaline Phos 39 - 117 U/L 102 - -  AST 0 - 37 U/L 22 - -  ALT 0 - 35 U/L 27 - -        Review of Systems     Past Medical History  Diagnosis Date  . COPD (chronic obstructive pulmonary disease)   . Depression   . Hypertension   . Asthma   . Anxiety   . Anemia   . Blood transfusion without reported diagnosis   . Diabetes mellitus without complication     a. Dx 02/2014.  [name] HOCM (hypertrophic obstructive cardiomyopathy)     a. Dx 32355.  . Vaginal bleeding   . SVT (supraventricular tachycardia)     a. Noted on tele during 02/2014 adm for symptomatic anemia.  [name] NSVT (nonsustained ventricular tachycardia)  a. Noted on tele during 02/2014 adm for symptomatic anemia.  . Morbid obesity   . Abnormal CT scan     a. 02/2014: evaluated for dyspnea and initially placed on Xarelto for PE because radiologist could not exclude small peripheral pulmonary emboli because of limited contrast. However, after admission for ++vaginal bleeding several days later, it was felt she did NOT have PE - underwent further testing with neg VQ 03/06/14, neg LE duplex.  . CHF (congestive heart failure)     No past surgical history on file.  No Known Allergies  Current Outpatient Prescriptions on File Prior to Visit  Medication Sig Dispense Refill  . buPROPion (WELLBUTRIN XL) 150 MG 24 hr tablet Take 1 tablet (150 mg total) by mouth daily. 30 tablet 0  . fluticasone (FLOVENT HFA) 110 MCG/ACT inhaler Inhale 2 puffs into the lungs 2 (two) times daily. 1 Inhaler 12  . levothyroxine (SYNTHROID, LEVOTHROID) 75  MCG tablet Take 75 mcg by mouth daily.    . meclizine (ANTIVERT) 25 MG tablet Take 1 tablet (25 mg total) by mouth 3 (three) times daily as needed. 21 tablet 0  . megestrol (MEGACE) 40 MG tablet TAKE FOUR TABLETS BY MOUTH ONCE DAILY (Patient taking differently: Take 160 mg by mouth daily. ) 120 tablet 11  . metFORMIN (GLUCOPHAGE) 500 MG tablet Take 1 tablet (500 mg total) by mouth 2 (two) times daily with a meal. 60 tablet 0  . metoprolol (LOPRESSOR) 50 MG tablet 50 mg 2 (two) times daily.     . potassium chloride SA (K-DUR,KLOR-CON) 20 MEQ tablet Take 2 tablets dailly (40 meq) 60 tablet 3  . pravastatin (PRAVACHOL) 20 MG tablet Take 1 tablet (20 mg total) by mouth daily. 30 tablet 1  . spironolactone (ALDACTONE) 25 MG tablet Take 0.5 tablets (12.5 mg total) by mouth daily. 30 tablet 6  . torsemide (DEMADEX) 20 MG tablet Take 20 mg by mouth 2 (two) times daily. Takes 2 tabs BID.    [name] albuterol (PROVENTIL HFA;VENTOLIN HFA) 108 (90 BASE) MCG/ACT inhaler Inhale 2 puffs into the lungs every 6 (six) hours as needed for wheezing or shortness of breath.    . silver sulfADIAZINE (SILVADENE) 1 % cream Use to area BID 50 g 11   No current facility-administered medications on file prior to visit.   Divorced. No children. Retired Education officer, [employer]  Objective:   Physical Exam  Filed Vitals:   12/19/14 1506  Height: 5\' 9"  (1.753 m)  Weight: 377 lb 12.8 oz (171.369 kg)   Alert and oriented. Skin warm and dry. Oral mucosa is moist.   . Sclera anicteric, conjunctivae is pink. Thyroid not enlarged. No cervical lymphadenopathy. Lungs clear. Heart regular rate and rhythm.  Abdomen is soft. Bowel sounds are positive. No hepatomegaly. No abdominal masses felt. No tenderness.  No edema to lower extremities. Morbidly obese. Stool brown and guaiac negative.        Assessment & Plan:  Diarrhea, ? Antibiotic induced. Will get a GI pathogen. If negative, may need a colonoscopy. Needs to take a Probiotic OTC. Take  Imodium BID on schedule.

## 2014-12-20 ENCOUNTER — Encounter (INDEPENDENT_AMBULATORY_CARE_PROVIDER_SITE_OTHER): Payer: Self-pay

## 2014-12-20 ENCOUNTER — Telehealth: Payer: Self-pay | Admitting: Cardiovascular Disease

## 2014-12-20 NOTE — Telephone Encounter (Signed)
LMTCB at 13:55 hrs

## 2014-12-20 NOTE — Telephone Encounter (Signed)
Patient having palpitations / please call with instructions / tgs

## 2014-12-22 NOTE — Telephone Encounter (Signed)
12/22/13 0959 hrs  Spoke with pt,her phone had died and she did not see my message from the other day until after we closed. She feels fine now but had 2 days of intermittent persistent palpitations which worried her.No caffeine,power drinks etc by patient.I told her I would let you know and see if you had any recommendations.She takes metoprolol 50 mg bid by Dr.Gosrani      FYI Dr Bronson Ing

## 2014-12-26 NOTE — Telephone Encounter (Signed)
Continued symptom monitoring with metoprolol 50 mg bid. No additional recs. Adequate hydration.

## 2014-12-26 NOTE — Telephone Encounter (Signed)
Pt notified will increase fluid intake and call back with any issuses

## 2015-01-19 DIAGNOSIS — E559 Vitamin D deficiency, unspecified: Secondary | ICD-10-CM | POA: Diagnosis not present

## 2015-01-19 DIAGNOSIS — E039 Hypothyroidism, unspecified: Secondary | ICD-10-CM | POA: Diagnosis not present

## 2015-01-19 DIAGNOSIS — I10 Essential (primary) hypertension: Secondary | ICD-10-CM | POA: Diagnosis not present

## 2015-01-19 DIAGNOSIS — E119 Type 2 diabetes mellitus without complications: Secondary | ICD-10-CM | POA: Diagnosis not present

## 2015-01-27 ENCOUNTER — Telehealth: Payer: Self-pay | Admitting: Obstetrics & Gynecology

## 2015-01-27 NOTE — Telephone Encounter (Signed)
Pt states that Dr. Elonda Husky has her taking megace 2 pills in the morning and 2 at night. Pt states that she has been having bleeding but this morning she woke up and has started bleeding and cramping really bad. Pt just wants to know what to do. I spoke with Dr. Elonda Husky and he advised for the pt to continue taking the 2 pills in the morning and 2 pills at night.  Pt verbalized understanding!!

## 2015-04-06 ENCOUNTER — Other Ambulatory Visit: Payer: Self-pay | Admitting: *Deleted

## 2015-04-07 MED ORDER — MEGESTROL ACETATE 40 MG PO TABS: ORAL_TABLET | ORAL | Status: DC

## 2015-04-13 DIAGNOSIS — E782 Mixed hyperlipidemia: Secondary | ICD-10-CM | POA: Diagnosis not present

## 2015-04-13 DIAGNOSIS — I10 Essential (primary) hypertension: Secondary | ICD-10-CM | POA: Diagnosis not present

## 2015-04-13 DIAGNOSIS — E119 Type 2 diabetes mellitus without complications: Secondary | ICD-10-CM | POA: Diagnosis not present

## 2015-04-13 DIAGNOSIS — E559 Vitamin D deficiency, unspecified: Secondary | ICD-10-CM | POA: Diagnosis not present

## 2015-05-01 ENCOUNTER — Ambulatory Visit (INDEPENDENT_AMBULATORY_CARE_PROVIDER_SITE_OTHER): Payer: Medicare Other | Admitting: Cardiovascular Disease

## 2015-05-01 ENCOUNTER — Encounter: Payer: Self-pay | Admitting: Cardiovascular Disease

## 2015-05-01 VITALS — BP 104/74 | HR 70 | Ht 69.0 in | Wt 374.0 lb

## 2015-05-01 DIAGNOSIS — I421 Obstructive hypertrophic cardiomyopathy: Secondary | ICD-10-CM | POA: Diagnosis not present

## 2015-05-01 DIAGNOSIS — I5032 Chronic diastolic (congestive) heart failure: Secondary | ICD-10-CM

## 2015-05-01 DIAGNOSIS — R0609 Other forms of dyspnea: Secondary | ICD-10-CM | POA: Diagnosis not present

## 2015-05-01 DIAGNOSIS — G4733 Obstructive sleep apnea (adult) (pediatric): Secondary | ICD-10-CM

## 2015-05-01 DIAGNOSIS — I10 Essential (primary) hypertension: Secondary | ICD-10-CM

## 2015-05-01 DIAGNOSIS — E785 Hyperlipidemia, unspecified: Secondary | ICD-10-CM

## 2015-05-01 MED ORDER — TORSEMIDE 20 MG PO TABS: 20.0000 mg | ORAL_TABLET | Freq: Every day | ORAL | Status: DC

## 2015-05-01 NOTE — Addendum Note (Signed)
Addended by: Barbarann Ehlers A on: 05/01/2015 02:25 PM   Modules accepted: Orders, Medications, Level of Service

## 2015-05-01 NOTE — Progress Notes (Signed)
Patient ID: Erin Sexton, female   DOB: 05-Oct-1971, 44 y.o.   MRN: [id number]      SUBJECTIVE: The patient returns for followup and has a history of hypertropic cardiomyopathy, morbid obesity, hypertension, asthma, sleep apnea, hyperlipidemia, anemia, and chronic diastolic heart failure with grade 2 diastolic dysfunction.  Echocardiogram in March 2015 demonstrated LVEF 65-70%, outflow tract gradient with velocity of 7.9 m/s. Grade 2 diastolic dysfunction was noted. There was moderate left atrial dilatation.  She had been doing well until the past week. She was normally able to walk two loops with her sister but now has only been able to do one loop for the past week before getting short of breath. She feels like she is dehydrated in spite of the amount of water she is drinking. She is on spironolactone 12.5 mg daily and torsemide 40 mg twice daily.   Review of Systems: As per "subjective", otherwise negative.  No Known Allergies  Current Outpatient Prescriptions  Medication Sig Dispense Refill  . albuterol (PROVENTIL HFA;VENTOLIN HFA) 108 (90 BASE) MCG/ACT inhaler Inhale 2 puffs into the lungs every 6 (six) hours as needed for wheezing or shortness of breath.    [name] buPROPion (WELLBUTRIN XL) 150 MG 24 hr tablet Take 1 tablet (150 mg total) by mouth daily. 30 tablet 0  . cholecalciferol (VITAMIN D) 400 UNITS TABS tablet Take 2,000 Units by mouth.    . fluticasone (FLOVENT HFA) 110 MCG/ACT inhaler Inhale 2 puffs into the lungs 2 (two) times daily. 1 Inhaler 12  . glipiZIDE (GLUCOTROL) 5 MG tablet Take by mouth 2 (two) times daily before a meal.    . levothyroxine (SYNTHROID, LEVOTHROID) 75 MCG tablet Take 125 mcg by mouth daily.     . megestrol (MEGACE) 40 MG tablet Take two tablets by mouth twice daily 120 tablet 3  . metFORMIN (GLUCOPHAGE) 500 MG tablet Take 1 tablet (500 mg total) by mouth 2 (two) times daily with a meal. 60 tablet 0  . metoprolol (LOPRESSOR) 50 MG tablet 50 mg 2 (two)  times daily.     . potassium chloride SA (K-DUR,KLOR-CON) 20 MEQ tablet Take 20 mEq by mouth daily.    . pravastatin (PRAVACHOL) 20 MG tablet Take 1 tablet (20 mg total) by mouth daily. 30 tablet 1  . silver sulfADIAZINE (SILVADENE) 1 % cream Use to area BID 50 g 11  . spironolactone (ALDACTONE) 25 MG tablet Take 0.5 tablets (12.5 mg total) by mouth daily. 30 tablet 6  . torsemide (DEMADEX) 20 MG tablet Take 20 mg by mouth. 2 tabs (40 mg) twice a day  3   No current facility-administered medications for this visit.    Past Medical History  Diagnosis Date  . COPD (chronic obstructive pulmonary disease)   . Depression   . Hypertension   . Asthma   . Anxiety   . Anemia   . Blood transfusion without reported diagnosis   . Diabetes mellitus without complication     a. Dx 02/2014.  [name] HOCM (hypertrophic obstructive cardiomyopathy)     a. Dx 66294.  . Vaginal bleeding   . SVT (supraventricular tachycardia)     a. Noted on tele during 02/2014 adm for symptomatic anemia.  [name] NSVT (nonsustained ventricular tachycardia)     a. Noted on tele during 02/2014 adm for symptomatic anemia.  . Morbid obesity   . Abnormal CT scan     a. 02/2014: evaluated for dyspnea and initially placed on Xarelto for PE because radiologist  could not exclude small peripheral pulmonary emboli because of limited contrast. However, after admission for ++vaginal bleeding several days later, it was felt she did NOT have PE - underwent further testing with neg VQ 03/06/14, neg LE duplex.  . CHF (congestive heart failure)     No past surgical history on file.  History   Social History  . Marital Status: Legally Separated    Spouse Name: N/A  . Number of Children: N/A  . Years of Education: N/A   Occupational History  . Not on file.   Social History Main Topics  . Smoking status: Never Smoker   . Smokeless tobacco: Never Used  . Alcohol Use: No  . Drug Use: No  . Sexual Activity: No   Other Topics Concern  . Not  on file   Social History Narrative     Filed Vitals:   05/01/15 1357  BP: 104/74  Pulse: 70  Height: 5\' 9"  (1.753 m)  Weight: 374 lb (169.645 kg)  SpO2: 97%    PHYSICAL EXAM General: NAD, morbidly obese. HEENT: Normal. Neck: Difficult to assess JVP, no thyromegaly. Lungs: Clear to auscultation bilaterally with normal respiratory effort. CV: Nondisplaced PMI. Regular rate and rhythm, normal S1/S2, no S3/S4, soft II/VI pansystolic murmur along left sternal border. Trace periankle edema.   Abdomen: Morbidly obese. Neurologic: Alert and oriented x 3.  Psych: Normal affect. Skin: Normal. Musculoskeletal: Normal range of motion, no gross deformities. Extremities: No clubbing or cyanosis.   ECG: Most recent ECG reviewed.      ASSESSMENT AND PLAN: 1. Hypertrophic cardiomyopathy: I suspect she has more of an outflow tract gradient leading to shortness of breath due to volume depletion secondary to high dose diuretics. Will d/c spironolactone and decrease torsemide to 20 mg daily. Continue metoprolol 50 mg twice daily.  2. Chronic diastolic heart failure: Appears euvolemic. Will monitor given discontinuation of spironolactone and reduction of torsemide dose.  3. Essential HTN: Well controlled on spironolactone and metoprolol. Will monitor given discontinuation of spironolactone.  4. Hyperlipidemia: Continue pravastatin 20 mg daily.  5. Sleep apnea: Had been intolerant to CPAP in the past. Previously encouraged to proceed with followup with a sleep specialist.  6. Morbid obesity: Exercise and continued weight loss encouraged. Is considering weight reduction surgery.  Dispo: f/u 6-8 weeks.  Kate Sable, M.D., F.A.C.C.

## 2015-05-01 NOTE — Patient Instructions (Signed)
Your physician recommends that you schedule a follow-up appointment in: 6-8 weeks with Dr Bronson Ing    STOP Spironolactone  DECREASE Torsemide to 20 mg daily      Thank you for choosing [facility] !

## 2015-05-29 ENCOUNTER — Other Ambulatory Visit (HOSPITAL_COMMUNITY): Payer: Self-pay | Admitting: Internal Medicine

## 2015-05-29 DIAGNOSIS — Z1231 Encounter for screening mammogram for malignant neoplasm of breast: Secondary | ICD-10-CM

## 2015-06-19 ENCOUNTER — Ambulatory Visit (INDEPENDENT_AMBULATORY_CARE_PROVIDER_SITE_OTHER): Payer: Medicare Other | Admitting: Cardiovascular Disease

## 2015-06-19 ENCOUNTER — Encounter: Payer: Self-pay | Admitting: Cardiovascular Disease

## 2015-06-19 VITALS — BP 124/76 | HR 74 | Ht 69.0 in | Wt 361.0 lb

## 2015-06-19 DIAGNOSIS — G4733 Obstructive sleep apnea (adult) (pediatric): Secondary | ICD-10-CM

## 2015-06-19 DIAGNOSIS — E785 Hyperlipidemia, unspecified: Secondary | ICD-10-CM

## 2015-06-19 DIAGNOSIS — I421 Obstructive hypertrophic cardiomyopathy: Secondary | ICD-10-CM | POA: Diagnosis not present

## 2015-06-19 DIAGNOSIS — I5032 Chronic diastolic (congestive) heart failure: Secondary | ICD-10-CM

## 2015-06-19 DIAGNOSIS — R0609 Other forms of dyspnea: Secondary | ICD-10-CM | POA: Diagnosis not present

## 2015-06-19 DIAGNOSIS — I10 Essential (primary) hypertension: Secondary | ICD-10-CM | POA: Diagnosis not present

## 2015-06-19 NOTE — Progress Notes (Signed)
Patient ID: Erin Sexton, female   DOB: 1971/12/09, 44 y.o.   MRN: [id number]      SUBJECTIVE: The patient returns for followup of shortness of breath. She has a history of hypertropic cardiomyopathy, morbid obesity, hypertension, asthma, sleep apnea, hyperlipidemia, anemia, and chronic diastolic heart failure with grade 2 diastolic dysfunction.  Echocardiogram in March 2015 demonstrated LVEF 65-70%, outflow tract gradient with velocity of 7.9 m/s. Grade 2 diastolic dysfunction was noted. There was moderate left atrial dilatation.  Weight down to 361 lbs from 374 lbs since 05/01/15.  She primarily eats fruit, vegetables, and chicken, and no longer eats sweets. She is walking more. She feels much better and denies shortness of breath and fatigue.   Review of Systems: As per "subjective", otherwise negative.  No Known Allergies  Current Outpatient Prescriptions  Medication Sig Dispense Refill  . albuterol (PROVENTIL HFA;VENTOLIN HFA) 108 (90 BASE) MCG/ACT inhaler Inhale 2 puffs into the lungs every 6 (six) hours as needed for wheezing or shortness of breath.    [name] buPROPion (WELLBUTRIN SR) 150 MG 12 hr tablet TK 1 T PO BID  2  . cholecalciferol (VITAMIN D) 400 UNITS TABS tablet Take 2,000 Units by mouth.    . fluticasone (FLOVENT HFA) 110 MCG/ACT inhaler Inhale 2 puffs into the lungs 2 (two) times daily. 1 Inhaler 12  . glipiZIDE (GLUCOTROL) 5 MG tablet Take by mouth 2 (two) times daily before a meal.    . levothyroxine (SYNTHROID, LEVOTHROID) 75 MCG tablet Take 125 mcg by mouth daily.     . megestrol (MEGACE) 40 MG tablet Take two tablets by mouth twice daily 120 tablet 3  . metFORMIN (GLUCOPHAGE) 500 MG tablet Take 1 tablet (500 mg total) by mouth 2 (two) times daily with a meal. 60 tablet 0  . metoprolol (LOPRESSOR) 50 MG tablet 50 mg 2 (two) times daily.     . potassium chloride SA (K-DUR,KLOR-CON) 20 MEQ tablet Take 20 mEq by mouth daily.    . pravastatin (PRAVACHOL) 20 MG tablet  Take 1 tablet (20 mg total) by mouth daily. 30 tablet 1  . silver sulfADIAZINE (SILVADENE) 1 % cream Use to area BID 50 g 11  . torsemide (DEMADEX) 20 MG tablet Take 1 tablet (20 mg total) by mouth daily. 90 tablet 3   No current facility-administered medications for this visit.    Past Medical History  Diagnosis Date  . COPD (chronic obstructive pulmonary disease)   . Depression   . Hypertension   . Asthma   . Anxiety   . Anemia   . Blood transfusion without reported diagnosis   . Diabetes mellitus without complication     a. Dx 02/2014.  [name] HOCM (hypertrophic obstructive cardiomyopathy)     a. Dx 38182.  . Vaginal bleeding   . SVT (supraventricular tachycardia)     a. Noted on tele during 02/2014 adm for symptomatic anemia.  [name] NSVT (nonsustained ventricular tachycardia)     a. Noted on tele during 02/2014 adm for symptomatic anemia.  . Morbid obesity   . Abnormal CT scan     a. 02/2014: evaluated for dyspnea and initially placed on Xarelto for PE because radiologist could not exclude small peripheral pulmonary emboli because of limited contrast. However, after admission for ++vaginal bleeding several days later, it was felt she did NOT have PE - underwent further testing with neg VQ 03/06/14, neg LE duplex.  . CHF (congestive heart failure)     No past surgical  history on file.  History   Social History  . Marital Status: Legally Separated    Spouse Name: N/A  . Number of Children: N/A  . Years of Education: N/A   Occupational History  . Not on file.   Social History Main Topics  . Smoking status: Never Smoker   . Smokeless tobacco: Never Used  . Alcohol Use: No  . Drug Use: No  . Sexual Activity: No   Other Topics Concern  . Not on file   Social History Narrative     Filed Vitals:   06/19/15 1401  BP: 124/76  Pulse: 74  Height: 5\' 9"  (1.753 m)  Weight: 361 lb (163.749 kg)  SpO2: 97%    PHYSICAL EXAM General: NAD, morbidly obese. HEENT: Normal. Neck:  Difficult to assess JVP, no thyromegaly. Lungs: Clear to auscultation bilaterally with normal respiratory effort. CV: Nondisplaced PMI. Regular rate and rhythm, normal S1/S2, no S3/S4, soft II/VI pansystolic murmur along left sternal border. No periankle edema.  Abdomen: Morbidly obese. Neurologic: Alert and oriented x 3.  Psych: Normal affect. Skin: Normal. Musculoskeletal: Normal range of motion, no gross deformities. Extremities: No clubbing or cyanosis.   ECG: Most recent ECG reviewed.      ASSESSMENT AND PLAN: 1. Hypertrophic cardiomyopathy: I previously thought she had a worsening outflow tract gradient leading to shortness of breath due to volume depletion secondary to high dose diuretics. Because of this, I discontinued spironolactone at her last visit and decreased torsemide to 20 mg daily.  She is doing much better from a symptomatic standpoint. I encouraged her on her weight loss. Continue metoprolol 50 mg twice daily.  2. Chronic diastolic heart failure: Euvolemic. No changes to therapy.  3. Essential HTN: Well controlled on metoprolol. No changes.  4. Hyperlipidemia: Continue pravastatin 20 mg daily.  5. Sleep apnea: Had been intolerant to CPAP in the past. Previously encouraged to proceed with followup with a sleep specialist.  6. Morbid obesity:  I encouraged her on her weight loss. Is considering weight reduction surgery.  Dispo: f/u 1 year.  Kate Sable, M.D., F.A.C.C.

## 2015-06-19 NOTE — Patient Instructions (Signed)
Your physician wants you to follow-up in: 1 year with Dr Koneswaran You will receive a reminder letter in the mail two months in advance. If you don't receive a letter, please call our office to schedule the follow-up appointment.    Your physician recommends that you continue on your current medications as directed. Please refer to the Current Medication list given to you today.     Thank you for choosing [facility] !        

## 2015-07-05 ENCOUNTER — Emergency Department (HOSPITAL_COMMUNITY)
Admission: EM | Admit: 2015-07-05 | Discharge: 2015-07-05 | Disposition: A | Discharge status: Home / Self Care | Payer: Medicare Other | Attending: Emergency Medicine | Admitting: Emergency Medicine

## 2015-07-05 ENCOUNTER — Encounter (HOSPITAL_COMMUNITY): Payer: Self-pay

## 2015-07-05 DIAGNOSIS — Z862 Personal history of diseases of the blood and blood-forming organs and certain disorders involving the immune mechanism: Secondary | ICD-10-CM | POA: Insufficient documentation

## 2015-07-05 DIAGNOSIS — Z7951 Long term (current) use of inhaled steroids: Secondary | ICD-10-CM | POA: Diagnosis not present

## 2015-07-05 DIAGNOSIS — L03116 Cellulitis of left lower limb: Secondary | ICD-10-CM | POA: Diagnosis not present

## 2015-07-05 DIAGNOSIS — J449 Chronic obstructive pulmonary disease, unspecified: Secondary | ICD-10-CM | POA: Diagnosis not present

## 2015-07-05 DIAGNOSIS — E119 Type 2 diabetes mellitus without complications: Secondary | ICD-10-CM | POA: Insufficient documentation

## 2015-07-05 DIAGNOSIS — F329 Major depressive disorder, single episode, unspecified: Secondary | ICD-10-CM | POA: Diagnosis not present

## 2015-07-05 DIAGNOSIS — F419 Anxiety disorder, unspecified: Secondary | ICD-10-CM | POA: Insufficient documentation

## 2015-07-05 DIAGNOSIS — I10 Essential (primary) hypertension: Secondary | ICD-10-CM | POA: Diagnosis not present

## 2015-07-05 DIAGNOSIS — L988 Other specified disorders of the skin and subcutaneous tissue: Secondary | ICD-10-CM | POA: Diagnosis present

## 2015-07-05 DIAGNOSIS — Z79899 Other long term (current) drug therapy: Secondary | ICD-10-CM | POA: Insufficient documentation

## 2015-07-05 DIAGNOSIS — I509 Heart failure, unspecified: Secondary | ICD-10-CM | POA: Insufficient documentation

## 2015-07-05 DIAGNOSIS — I471 Supraventricular tachycardia: Secondary | ICD-10-CM | POA: Diagnosis not present

## 2015-07-05 MED ORDER — SULFAMETHOXAZOLE-TRIMETHOPRIM 800-160 MG PO TABS: 1.0000 | ORAL_TABLET | Freq: Two times a day (BID) | ORAL | Status: AC

## 2015-07-05 MED ORDER — TRIAMCINOLONE ACETONIDE 0.1 % EX CREA: 1.0000 "application " | TOPICAL_CREAM | Freq: Two times a day (BID) | CUTANEOUS | Status: DC
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
## 2015-07-05 NOTE — Discharge Instructions (Signed)
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
## 2015-07-07 ENCOUNTER — Ambulatory Visit (HOSPITAL_COMMUNITY)
Admission: RE | Admit: 2015-07-07 | Discharge: 2015-07-07 | Disposition: A | Discharge status: Home / Self Care | Payer: Medicare Other | Source: Ambulatory Visit | Attending: Internal Medicine | Admitting: Internal Medicine

## 2015-07-07 ENCOUNTER — Encounter (HOSPITAL_COMMUNITY): Payer: Self-pay | Admitting: *Deleted

## 2015-07-07 ENCOUNTER — Emergency Department (HOSPITAL_COMMUNITY)
Admission: EM | Admit: 2015-07-07 | Discharge: 2015-07-07 | Disposition: A | Discharge status: Home / Self Care | Payer: Medicare Other | Attending: Emergency Medicine | Admitting: Emergency Medicine

## 2015-07-07 DIAGNOSIS — Z7951 Long term (current) use of inhaled steroids: Secondary | ICD-10-CM | POA: Diagnosis not present

## 2015-07-07 DIAGNOSIS — F329 Major depressive disorder, single episode, unspecified: Secondary | ICD-10-CM | POA: Insufficient documentation

## 2015-07-07 DIAGNOSIS — J449 Chronic obstructive pulmonary disease, unspecified: Secondary | ICD-10-CM | POA: Insufficient documentation

## 2015-07-07 DIAGNOSIS — Z792 Long term (current) use of antibiotics: Secondary | ICD-10-CM | POA: Diagnosis not present

## 2015-07-07 DIAGNOSIS — Z79899 Other long term (current) drug therapy: Secondary | ICD-10-CM | POA: Insufficient documentation

## 2015-07-07 DIAGNOSIS — I10 Essential (primary) hypertension: Secondary | ICD-10-CM | POA: Insufficient documentation

## 2015-07-07 DIAGNOSIS — Z5189 Encounter for other specified aftercare: Secondary | ICD-10-CM

## 2015-07-07 DIAGNOSIS — Z1231 Encounter for screening mammogram for malignant neoplasm of breast: Secondary | ICD-10-CM | POA: Insufficient documentation

## 2015-07-07 DIAGNOSIS — Z862 Personal history of diseases of the blood and blood-forming organs and certain disorders involving the immune mechanism: Secondary | ICD-10-CM | POA: Insufficient documentation

## 2015-07-07 DIAGNOSIS — F419 Anxiety disorder, unspecified: Secondary | ICD-10-CM | POA: Diagnosis not present

## 2015-07-07 DIAGNOSIS — Z8742 Personal history of other diseases of the female genital tract: Secondary | ICD-10-CM | POA: Diagnosis not present

## 2015-07-07 DIAGNOSIS — I509 Heart failure, unspecified: Secondary | ICD-10-CM | POA: Insufficient documentation

## 2015-07-07 DIAGNOSIS — Z4801 Encounter for change or removal of surgical wound dressing: Secondary | ICD-10-CM | POA: Diagnosis not present

## 2015-07-07 DIAGNOSIS — E119 Type 2 diabetes mellitus without complications: Secondary | ICD-10-CM | POA: Diagnosis not present

## 2015-07-07 DIAGNOSIS — Z4800 Encounter for change or removal of nonsurgical wound dressing: Secondary | ICD-10-CM | POA: Diagnosis not present

## 2015-07-07 MED ORDER — TRAMADOL HCL 50 MG PO TABS: 50.0000 mg | ORAL_TABLET | Freq: Four times a day (QID) | ORAL | Status: DC | PRN

## 2015-07-07 NOTE — Discharge Instructions (Signed)
Wound Check Your wound appears healthy today. Your wound will heal gradually over time. Eventually a scar will form that will fade with time. FACTORS THAT AFFECT SCAR FORMATION:  People differ in the severity in which they scar.  Scar severity varies according to location, size, and the traits you inherited from your parents (genetic predisposition).  Irritation to the wound from infection, rubbing, or chemical exposure will increase the amount of scar formation. HOME CARE INSTRUCTIONS   If you were given a dressing, you should change it at least once a day or as instructed by your caregiver. If the bandage sticks, soak it off with a solution of hydrogen peroxide.  If the bandage becomes wet, dirty, or develops a bad smell, change it as soon as possible.  Look for signs of infection.  Only take over-the-counter or prescription medicines for pain, discomfort, or fever as directed by your caregiver. SEEK IMMEDIATE MEDICAL CARE IF:   You have redness, swelling, or increasing pain in the wound.  You notice pus coming from the wound.  You have a fever.  You notice a bad smell coming from the wound or dressing. Document Released: 08/31/2004 Document Revised: 02/17/2012 Document Reviewed: 11/25/2005 [employer] Patient Information 2015 [place], [place]. This information is not intended to replace advice given to you by your health care provider. Make sure you discuss any questions you have with your health care provider.   Finish taking her antibiotic as discussed.  You can forego using the steroid cream at this time as I suspect this is not adding any benefit at this time.  I recommend warm compresses twice daily for 10 minutes.  Keep your wound covered until it dries.

## 2015-07-07 NOTE — ED Notes (Signed)
Pt came in for 48 hr recheck of cellulitis to left posterior calf. Skin marker in surrounding area shows to be improving due to shrinking. Pt has been taking her antibiotics that she was prescribed 07/05/15. Pt attempted to have recheck with PCP, Dr. Anastasio Champion, but he was unable to see pt until next Wednesday.

## 2015-07-09 NOTE — ED Provider Notes (Signed)
CSN: [id number]     Arrival date & time 07/07/15  1241 History   First MD Initiated Contact with Patient 07/07/15 1256     Chief Complaint  Patient presents with  . Rash     (Consider location/radiation/quality/duration/timing/severity/associated sxs/prior Treatment) Patient is a 44 y.o. female presenting with rash. The history is provided by the patient.  Rash Associated symptoms: no fever, no shortness of breath and not wheezing    Erin Sexton is a 44 y.o. female presenting for wound recheck.  She was seen here 2 days ago and diagnosed with a left posterior calf suspected cellulitis and has been taking bactrim along with using warm compresses and elevation and has obtained improvement in her infection. The area of redness is reduced in comparison to her last visit. She still endorses tenderness but this is also improving.  She denies fevers, chills, nausea or other complaint.    Past Medical History  Diagnosis Date  . COPD (chronic obstructive pulmonary disease)   . Depression   . Hypertension   . Asthma   . Anxiety   . Anemia   . Blood transfusion without reported diagnosis   . Diabetes mellitus without complication     a. Dx 02/2014.  [name] HOCM (hypertrophic obstructive cardiomyopathy)     a. Dx 36644.  . Vaginal bleeding   . SVT (supraventricular tachycardia)     a. Noted on tele during 02/2014 adm for symptomatic anemia.  [name] NSVT (nonsustained ventricular tachycardia)     a. Noted on tele during 02/2014 adm for symptomatic anemia.  . Morbid obesity   . Abnormal CT scan     a. 02/2014: evaluated for dyspnea and initially placed on Xarelto for PE because radiologist could not exclude small peripheral pulmonary emboli because of limited contrast. However, after admission for ++vaginal bleeding several days later, it was felt she did NOT have PE - underwent further testing with neg VQ 03/06/14, neg LE duplex.  . CHF (congestive heart failure)    History reviewed. No pertinent  past surgical history. Family History  Problem Relation Age of Onset  . CAD Neg Hx     No family history of EARLY CAD  . Diabetes Mother   . Arthritis Mother   . Other Mother     WPW  . Uterine cancer Maternal Grandmother    History  Substance Use Topics  . Smoking status: Never Smoker   . Smokeless tobacco: Never Used  . Alcohol Use: No   OB History    Gravida Para Term Preterm AB TAB SAB Ectopic Multiple Living            0     Review of Systems  Constitutional: Negative for fever and chills.  Respiratory: Negative for shortness of breath and wheezing.   Skin: Positive for color change.  Neurological: Negative for numbness.      Allergies  Review of patient's allergies indicates no known allergies.  Home Medications   Prior to Admission medications   Medication Sig Start Date End Date Taking? Authorizing Provider  albuterol (PROVENTIL HFA;VENTOLIN HFA) 108 (90 BASE) MCG/ACT inhaler Inhale 2 puffs into the lungs every 6 (six) hours as needed for wheezing or shortness of breath.    Historical Provider, MD  buPROPion (WELLBUTRIN SR) 150 MG 12 hr tablet TK 1 T PO BID 06/01/15   Historical Provider, MD  cholecalciferol (VITAMIN D) 400 UNITS TABS tablet Take 2,000 Units by mouth.    Historical Provider, MD  fluticasone (FLOVENT HFA) 110 MCG/ACT inhaler Inhale 2 puffs into the lungs 2 (two) times daily. 03/03/14   Kathie Dike, MD  glipiZIDE (GLUCOTROL) 5 MG tablet Take by mouth 2 (two) times daily before a meal.    Historical Provider, MD  levothyroxine (SYNTHROID, LEVOTHROID) 75 MCG tablet Take 125 mcg by mouth daily.  10/16/14   Historical Provider, MD  megestrol (MEGACE) 40 MG tablet Take two tablets by mouth twice daily 04/07/15   Florian Buff, MD  metFORMIN (GLUCOPHAGE) 500 MG tablet Take 1 tablet (500 mg total) by mouth 2 (two) times daily with a meal. 05/27/14   Lavon Paganini Angiulli, PA-C  metoprolol (LOPRESSOR) 50 MG tablet 50 mg 2 (two) times daily.  11/16/14   Historical  Provider, MD  potassium chloride SA (K-DUR,KLOR-CON) 20 MEQ tablet Take 20 mEq by mouth daily.    Historical Provider, MD  pravastatin (PRAVACHOL) 20 MG tablet Take 1 tablet (20 mg total) by mouth daily. 05/27/14   Lavon Paganini Angiulli, PA-C  silver sulfADIAZINE (SILVADENE) 1 % cream Use to area BID 11/17/14   Florian Buff, MD  sulfamethoxazole-trimethoprim (BACTRIM DS,SEPTRA DS) 800-160 MG per tablet Take 1 tablet by mouth 2 (two) times daily. 07/05/15 07/12/15  Evalee Jefferson, PA-C  torsemide (DEMADEX) 20 MG tablet Take 1 tablet (20 mg total) by mouth daily. 05/01/15   Herminio Commons, MD  traMADol (ULTRAM) 50 MG tablet Take 1 tablet (50 mg total) by mouth every 6 (six) hours as needed. 07/07/15   Evalee Jefferson, PA-C  triamcinolone cream (KENALOG) 0.1 % Apply 1 application topically 2 (two) times daily. Apply to blistered area of lesion 07/05/15   Evalee Jefferson, PA-C   BP 129/68 mmHg  Pulse 68  Temp(Src) 98.2 F (36.8 C) (Oral)  Resp 18  Ht 5\' 9"  (1.753 m)  Wt 360 lb (163.295 kg)  BMI 53.14 kg/m2  SpO2 96% Physical Exam  Constitutional: She appears well-developed and well-nourished. No distress.  HENT:  Head: Normocephalic.  Neck: Neck supple.  Cardiovascular: Normal rate.   Pulmonary/Chest: Effort normal. She has no wheezes.  Musculoskeletal: Normal range of motion. She exhibits no edema.  Skin: There is erythema.  Reduced erythema compared to prior exam.  Area of involvement has shrunk will within the skin marker lines.  There is a still intact bulla along the interior border of the involved area.  No red streaking, no induration.     ED Course  Procedures (including critical care time) Labs Review Labs Reviewed - No data to display  Imaging Review No results found.   EKG Interpretation None      MDM   Final diagnoses:  Visit for wound check    Pt advised to complete abx, continue warm soaks. Recheck for any worsened sx, expect complete resolution of this infection with current  tx. Prn f/u anticipated.    Evalee Jefferson, PA-C 07/09/15 [place], DO 07/10/15 1233

## 2015-07-10 ENCOUNTER — Emergency Department (HOSPITAL_COMMUNITY)
Admission: EM | Admit: 2015-07-10 | Discharge: 2015-07-10 | Disposition: A | Discharge status: Home / Self Care | Payer: Medicare Other | Attending: Emergency Medicine | Admitting: Emergency Medicine

## 2015-07-10 ENCOUNTER — Encounter (HOSPITAL_COMMUNITY): Payer: Self-pay

## 2015-07-10 DIAGNOSIS — E119 Type 2 diabetes mellitus without complications: Secondary | ICD-10-CM | POA: Diagnosis not present

## 2015-07-10 DIAGNOSIS — I10 Essential (primary) hypertension: Secondary | ICD-10-CM | POA: Diagnosis not present

## 2015-07-10 DIAGNOSIS — I951 Orthostatic hypotension: Secondary | ICD-10-CM

## 2015-07-10 DIAGNOSIS — Z79899 Other long term (current) drug therapy: Secondary | ICD-10-CM | POA: Insufficient documentation

## 2015-07-10 DIAGNOSIS — F419 Anxiety disorder, unspecified: Secondary | ICD-10-CM | POA: Insufficient documentation

## 2015-07-10 DIAGNOSIS — R5383 Other fatigue: Secondary | ICD-10-CM

## 2015-07-10 DIAGNOSIS — Z792 Long term (current) use of antibiotics: Secondary | ICD-10-CM | POA: Diagnosis not present

## 2015-07-10 DIAGNOSIS — J449 Chronic obstructive pulmonary disease, unspecified: Secondary | ICD-10-CM | POA: Diagnosis not present

## 2015-07-10 DIAGNOSIS — I509 Heart failure, unspecified: Secondary | ICD-10-CM | POA: Diagnosis not present

## 2015-07-10 HISTORY — DX: Personal history of other medical treatment: Z92.89

## 2015-07-10 LAB — BASIC METABOLIC PANEL
ANION GAP: 9 (ref 5–15)
BUN: 18 mg/dL (ref 6–20)
CHLORIDE: 107 mmol/L (ref 101–111)
CO2: 26 mmol/L (ref 22–32)
CREATININE: 1.51 mg/dL — AB (ref 0.44–1.00)
Calcium: 9.1 mg/dL (ref 8.9–10.3)
GFR, EST AFRICAN AMERICAN: 47 mL/min — AB (ref >60)
GFR, EST NON AFRICAN AMERICAN: 40 mL/min — AB (ref >60)
GLUCOSE: 81 mg/dL (ref 65–99)
Potassium: 4.2 mmol/L (ref 3.5–5.1)
SODIUM: 142 mmol/L (ref 135–145)

## 2015-07-10 LAB — I-STAT CHEM 8, ED
BUN: 16 mg/dL (ref 6–20)
CALCIUM ION: 1.18 mmol/L (ref 1.12–1.23)
CHLORIDE: 107 mmol/L (ref 101–111)
Creatinine, Ser: 1.5 mg/dL — ABNORMAL HIGH (ref 0.44–1.00)
GLUCOSE: 85 mg/dL (ref 65–99)
HCT: 41 % (ref 36.0–46.0)
Hemoglobin: 13.9 g/dL (ref 12.0–15.0)
Potassium: 4.2 mmol/L (ref 3.5–5.1)
Sodium: 143 mmol/L (ref 135–145)
TCO2: 20 mmol/L (ref 0–100)

## 2015-07-10 LAB — CBC WITH DIFFERENTIAL/PLATELET
Basophils Absolute: 0.1 10*3/uL (ref 0.0–0.1)
Basophils Relative: 1 % (ref 0–1)
EOS PCT: 5 % (ref 0–5)
Eosinophils Absolute: 0.4 10*3/uL (ref 0.0–0.7)
HEMATOCRIT: 41.7 % (ref 36.0–46.0)
HEMOGLOBIN: 13.9 g/dL (ref 12.0–15.0)
LYMPHS PCT: 34 % (ref 12–46)
Lymphs Abs: 2.5 10*3/uL (ref 0.7–4.0)
MCH: 29.6 pg (ref 26.0–34.0)
MCHC: 33.3 g/dL (ref 30.0–36.0)
MCV: 88.9 fL (ref 78.0–100.0)
Monocytes Absolute: 0.3 10*3/uL (ref 0.1–1.0)
Monocytes Relative: 5 % (ref 3–12)
NEUTROS ABS: 4 10*3/uL (ref 1.7–7.7)
Neutrophils Relative %: 55 % (ref 43–77)
Platelets: 313 10*3/uL (ref 150–400)
RBC: 4.69 MIL/uL (ref 3.87–5.11)
RDW: 13.3 % (ref 11.5–15.5)
WBC: 7.3 10*3/uL (ref 4.0–10.5)

## 2015-07-10 LAB — URINALYSIS, ROUTINE W REFLEX MICROSCOPIC
BILIRUBIN URINE: NEGATIVE
GLUCOSE, UA: NEGATIVE mg/dL
Ketones, ur: NEGATIVE mg/dL
Nitrite: NEGATIVE
SPECIFIC GRAVITY, URINE: 1.025 (ref 1.005–1.030)
Urobilinogen, UA: 0.2 mg/dL (ref 0.0–1.0)
pH: 5.5 (ref 5.0–8.0)

## 2015-07-10 LAB — URINE MICROSCOPIC-ADD ON

## 2015-07-10 LAB — LACTIC ACID, PLASMA: Lactic Acid, Venous: 1 mmol/L (ref 0.5–2.0)

## 2015-07-10 MED ORDER — SODIUM CHLORIDE 0.9 % IV BOLUS (SEPSIS)
1000.0000 mL | Freq: Once | INTRAVENOUS | Status: AC
  Administered 2015-07-10: 1000 mL via INTRAVENOUS

## 2015-07-10 NOTE — ED Notes (Signed)
Orthostatics vs complete, all WNL, pt stated she felt dizzy when standing up

## 2015-07-10 NOTE — Discharge Instructions (Signed)
°Emergency Department Resource Guide °1) Find a Doctor and Pay Out of Pocket °Although you won't have to find out who is covered by your insurance plan, it is a good idea to ask around and get recommendations. You will then need to call the office and see if the doctor you have chosen will accept you as a new patient and what types of options they offer for patients who are self-pay. Some doctors offer discounts or will set up payment plans for their patients who do not have insurance, but you will need to ask so you aren't surprised when you get to your appointment. ° °2) Contact Your Local Health Department °Not all health departments have doctors that can see patients for sick visits, but many do, so it is worth a call to see if yours does. If you don't know where your local health department is, you can check in your phone book. The CDC also has a tool to help you locate your state's health department, and many state websites also have listings of all of their local health departments. ° °3) Find a Walk-in Clinic °If your illness is not likely to be very severe or complicated, you may want to try a walk in clinic. These are popping up all over the country in pharmacies, drugstores, and shopping centers. They're usually staffed by nurse practitioners or physician assistants that have been trained to treat common illnesses and complaints. They're usually fairly quick and inexpensive. However, if you have serious medical issues or chronic medical problems, these are probably not your best option. ° °No Primary Care Doctor: °- Call [facility] at  832-8000 - they can help you locate a primary care doctor that  accepts your insurance, provides certain services, etc. °- Physician Referral Service- 1-800-533-3463 ° °Chronic Pain Problems: °Organization         Address  Phone   Notes  °[facility]  (336) [phone] Patients need to be referred by their primary care doctor.  ° °Medication  Assistance: °Organization         Address  Phone   Notes  °[place] [facility] [address] °[place], [place] [address] (336) 641-8030 --Must be a resident of [place] °-- Must have NO insurance coverage whatsoever (no Medicaid/ Medicare, etc.) °-- The pt. MUST have a primary care doctor that directs their care regularly and follows them in the community °  °[facility]  (866) 331-1348   °[employer]  (888) 892-1162   ° °Agencies that provide inexpensive medical care: °Organization         Address  Phone   Notes  °[facility]  (336) 832-8035   ° Internal Medicine    (336) 832-7272   °[facility] [address] °[place], [place] [address] (336) 832-4777   °[facility] [address], °[place] (336) 271-4999   °[facility]    (336) 373-0678   °[facility]    (336) 272-1050   °[facility] ° [address], [place] Phone:  (336) 832-4444, Fax:  (336) 832-4440 Hours of Operation:  9 am - 6 pm, M-F.  Also accepts Medicaid/Medicare and self-pay.  °[facility] ° [address], [place] Phone: (336) 832-3150, Fax: (336) [phone]. Hours of Operation:  8:30 am - 5:30 pm, M-F.  Also accepts Medicaid and self-pay.  °[facility] [place] [address], [place] Phone: (336) 878-6027   °[facility] [address], [place], [place] (336)723-1848, Ext. 123 Mondays & Thursdays: 7-9 AM.  First 15 patients are seen on a first come, first serve basis. °  ° °Medicaid-accepting [place] Providers: ° °Organization         Address  Phone   Notes  °[facility] [address], [place] (336) 641-2100 Also accepts self-pay patients.  °[facility] [address], [place] ° (336) 856-9996   °[facility] [address], [place]  (336) 288-8857   °[facility] [address] [place] [address], [place] (336) 299-7000   °Veita Bland [address], [place]  ° (336) 373-1557 Only accepts [place] Access Medicaid patients after they have their name applied to their card.  ° °Self-Pay (no insurance) in [place]: ° °Organization         Address  Phone   Notes  °Sickle Cell Patients, [facility] [address], [place] (336) 832-1970   °[facility] [address], [place] (336) 832-4400   °[facility] [place] ° [address],  (336) 992-4800   °[facility]/Dr. Osei-Bonsu ° [address] [place] [address], [place] or [address], [place] (336) [phone] Phone number for both [place] and [place] locations is the same.  °[facility] [address], [place] (336) 299-0000   °[facility] [address] [place] [address], [place] or [address] (336) 852-7530 °(336) 878-2260   °[facility] [address], [place] (336) 350-1642, phone; (336) 294-5005, fax Sees patients 1st and 3rd Saturday of every month.  Must not qualify for public or private insurance (i.e. Medicaid, Medicare, [place] Health Choice, Veterans' Benefits) • Household income should be no more than 200% of the poverty level •The clinic cannot treat you if you are pregnant or think you are pregnant • Sexually transmitted diseases are not treated at the clinic.  ° ° °Dental Care: °Organization         Address  Phone  Notes  °[place] [facility] [address], [place] (336) 641-6152 Accepts children up to age 21 who are enrolled in Medicaid or [place] Health Choice; pregnant women with a Medicaid card; and children who have applied for Medicaid or [place] Health Choice, but were declined, whose parents can pay a reduced fee at time of service.  °[place]  [facility] [place]  [address], [place] (336) 641-7733 Accepts children up to age 21 who are enrolled in Medicaid or [place] Health Choice; pregnant women with a Medicaid card; and children who have applied for Medicaid or [place] Health Choice, but were declined, whose parents can pay a reduced fee at time of service.  °[facility] ° [address], [place] (336) 641-4533 Patients are seen by appointment only. Walk-ins are not accepted. [facility] will see patients 18 years of age and older. °Monday - Tuesday (8am-5pm) °Most Wednesdays (8:30-5pm) °$30 per visit, cash only  °[facility] ° [address], [place] (336) 641-4533 Patients are seen by appointment only. Walk-ins are not accepted. [facility] will see patients 18 years of age and older. °One   Wednesday Evening (Monthly: Volunteer Based).  $30 per visit, cash only  °[facility]  (919) 537-3737 for adults; Children under age 4, call Graduate Pediatric Dentistry at ([phone]) [phone]. Children aged 4-14, please call ([phone]) [phone] to request a pediatric application. ° Dental services are provided in all areas of dental care including fillings, crowns and bridges, complete and partial dentures, implants, gum treatment, root canals, and extractions. Preventive care is also provided. Treatment is provided to both adults and children. °Patients are selected via a lottery and there is often a waiting list. °  °[facility] [address], °[place] ° (336) 763-8833 [email or web address].drcivils.[email or web address] °  °[facility] [address], [place], [place] (336)723-1848, Ext. 123 Second and Fourth Thursday of each month, opens at 6:30 AM; Clinic ends at 9 AM.  Patients are seen on a first-come first-served basis, and a limited number are seen during each clinic.  ° °[facility] ° [address], [place], [place] (336) 723-7904    Eligibility Requirements °You must have lived in [place], [place], or [place] counties for at least the last three months. °  You cannot be eligible for state or federal sponsored healthcare insurance, including Veterans Administration, Medicaid, or Medicare. °  You generally cannot be eligible for healthcare insurance through your employer.  °  How to apply: °Eligibility screenings are held every Tuesday and Wednesday afternoon from 1:00 pm until 4:00 pm. You do not need an appointment for the interview!  °[facility] [address], [place], [place] 336-631-2330   °[facility]  336-342-8273   °[facility]  336-703-3100   °[facility]  336-570-6415   ° °Behavioral Health Resources in the Community: °Intensive Outpatient Programs °Organization         Address  Phone  Notes  °[facility] [address], [place], [place] 336-878-6098   °[facility] [address], [place], [place] 336-832-9800   °[facility] [address], [place], [place] ° 336-882-2125   °[facility] [address],  °[place], [place] 1-800-853-5163 or 336-641-4981   °Substance Abuse Resources °Organization         Address  Phone  Notes  °[facility]  336-882-2125   °[facility]  336-784-9470   °[facility]  336-285-9073   °[facility]  336-845-3988   °Residential & Outpatient Substance Abuse Program  1-800-659-3381   °Psychological Services °Organization         Address  Phone  Notes  °[facility]  336- 832-9600   °[facility]  336- 378-7881   °[facility] [address], [place] 1-800-853-5163 or 336-641-4981   ° °Mobile Crisis Teams °Organization         Address  Phone  Notes  °[facility], Mobile Crisis Care Unit  1-877-626-1772   °Assertive °[facility] ° [address]  [place], [place] 336-834-9664   °Sharon DeEsch [address] °[place] 336-554-5454   ° °Self-Help/Support Groups °Organization         Address  Phone             Notes  °[facility] - variety of support groups  336- 373-1402 Call for more information  °Narcotics Anonymous (NA), [facility] [address], °[place]   2 meetings at this location  ° °  Residential Treatment Programs Organization         Address  Phone  Notes  [facility] [address]    [place]  1-479 143 5798   [facility]  [address] [place] [address], [place], [place]   [place], [place] 806-068-9995 Admissions: 8am-3pm M-F  [facility] [place] [address]    [place], [place] [phone]   [facility] [address] [place], [place], [place]   [facility] [address],  [place], [place]   [facility] Intensive Outpatient [address], [place], [place]   [facility] ([place] [address]  [place], [place] 1-6477158750 or (984)751-0583   [facility] [address] [place], [place] Accepts Medicaid  [facility] [address]  [place] 1-(848) 621-6677 Substance Abuse/Addiction Treatment   [facility] Organization         Address  Phone  Notes  [facility]  [phone]   Domenic Schwab, PhD [address] [place], [place]   949-166-3927 or 708-166-2851   [facility] [place], [place] 816-778-1570   [facility] [address], [place], [place] 737-851-9699 Insurance/Medicaid/sponsorship through [facility] [address]                                    [place], [place] [phone] [place] [address], [place] [phone]    Dr. Adele Schilder  [phone]   [facility] Dept. 1) [address] [place] 2) [place] 3)  [address], [place] [phone]  6127756287   [place] (416)[phone] or [phone] (After Hours)      Take your usual prescriptions as previously directed.  Increase your fluid intake (ie:  Gatoraide) for the next few days. Call your regular medical doctor tomorrow to schedule a follow up appointment in the next 2 to 3 days.  Return to the Emergency Department immediately if not improving (or even worsening) despite taking the medicines as prescribed, any black or bloody stool or vomit, if you develop a fever over "101," or for any other concerns.

## 2015-07-10 NOTE — ED Notes (Signed)
Pt seen here and dx with cellulitis of the left leg. States she has been on antibiotics x5 days, symptoms are not improving, and she is now feeling generally fatigued and somewhat lightheaded since this morning.

## 2015-07-10 NOTE — ED Provider Notes (Signed)
CSN: [id number]     Arrival date & time 07/10/15  1829 History   First MD Initiated Contact with Patient 07/10/15 1924     Chief Complaint  Patient presents with  . Recurrent Skin Infections  . Fatigue      HPI Pt was seen at 1950. Per pt, c/o gradual onset and improvement of constant "rash" to her left leg for the past 1 week. Has has been evaluated in the ED x2 for same, rx abx with improvement. Pt states she now "feels very tired" and "lightheaded" starting this morning. Denies CP/SOB, no N/V/D, no abd pain, no back pain, no fevers, no focal motor weakness, no tingling/numbness in extremities.    Past Medical History  Diagnosis Date  . COPD (chronic obstructive pulmonary disease)   . Depression   . Hypertension   . Asthma   . Anxiety   . Anemia   . Blood transfusion without reported diagnosis   . Diabetes mellitus without complication     a. Dx 02/2014.  [name] HOCM (hypertrophic obstructive cardiomyopathy)     a. Dx 67209.  . Vaginal bleeding   . SVT (supraventricular tachycardia)     a. Noted on tele during 02/2014 adm for symptomatic anemia.  [name] NSVT (nonsustained ventricular tachycardia)     a. Noted on tele during 02/2014 adm for symptomatic anemia.  . Morbid obesity   . Abnormal CT scan     a. 02/2014: evaluated for dyspnea and initially placed on Xarelto for PE because radiologist could not exclude small peripheral pulmonary emboli because of limited contrast. However, after admission for ++vaginal bleeding several days later, it was felt she did NOT have PE - underwent further testing with neg VQ 03/06/14, neg LE duplex.  . CHF (congestive heart failure)    History reviewed. No pertinent past surgical history.   Family History  Problem Relation Age of Onset  . CAD Neg Hx     No family history of EARLY CAD  . Diabetes Mother   . Arthritis Mother   . Other Mother     WPW  . Uterine cancer Maternal Grandmother    History  Substance Use Topics  . Smoking status: Never  Smoker   . Smokeless tobacco: Never Used  . Alcohol Use: No    Review of Systems ROS: Statement: All systems negative except as marked or noted in the HPI; Constitutional: Negative for fever and chills. +generalized fatigue.; ; Eyes: Negative for eye pain, redness and discharge. ; ; ENMT: Negative for ear pain, hoarseness, nasal congestion, sinus pressure and sore throat. ; ; Cardiovascular: Negative for chest pain, palpitations, diaphoresis, dyspnea and peripheral edema. ; ; Respiratory: Negative for cough, wheezing and stridor. ; ; Gastrointestinal: Negative for nausea, vomiting, diarrhea, abdominal pain, blood in stool, hematemesis, jaundice and rectal bleeding. . ; ; Genitourinary: Negative for dysuria, flank pain and hematuria. ; ; Musculoskeletal: Negative for back pain and neck pain. Negative for swelling and trauma.; ; Skin: +rash. Negative for pruritus, abrasions, blisters, bruising and skin lesion.; ; Neuro: Negative for headache, lightheadedness and neck stiffness. Negative for altered level of consciousness , altered mental status, extremity weakness, paresthesias, involuntary movement, seizure and syncope.      Allergies  Review of patient's allergies indicates no known allergies.  Home Medications   Prior to Admission medications   Medication Sig Start Date End Date Taking? Authorizing Provider  albuterol (PROVENTIL HFA;VENTOLIN HFA) 108 (90 BASE) MCG/ACT inhaler Inhale 2 puffs into the lungs  every 6 (six) hours as needed for wheezing or shortness of breath.   Yes Historical Provider, MD  buPROPion (WELLBUTRIN SR) 150 MG 12 hr tablet TAKE ONE TABLET BY MOUTH TWICE DAILY 06/01/15  Yes Historical Provider, MD  Cholecalciferol (VITAMIN D) 2000 UNITS CAPS Take 1 capsule by mouth daily.   Yes Historical Provider, MD  fluticasone (FLOVENT HFA) 110 MCG/ACT inhaler Inhale 2 puffs into the lungs 2 (two) times daily. Patient taking differently: Inhale 2 puffs into the lungs daily as needed  (FOR SHORTNESS OF BREATH).  03/03/14  Yes Kathie Dike, MD  glipiZIDE (GLUCOTROL) 5 MG tablet Take 5 mg by mouth 2 (two) times daily before a meal.    Yes Historical Provider, MD  levothyroxine (SYNTHROID, LEVOTHROID) 125 MCG tablet TK 1 T PO QD 04/26/15  Yes Historical Provider, MD  megestrol (MEGACE) 40 MG tablet Take two tablets by mouth twice daily Patient taking differently: Take 80 mg by mouth 2 (two) times daily. Take two tablets by mouth twice daily 04/07/15  Yes Florian Buff, MD  metFORMIN (GLUCOPHAGE) 500 MG tablet Take 1 tablet (500 mg total) by mouth 2 (two) times daily with a meal. 05/27/14  Yes Daniel J Angiulli, PA-C  metoprolol (LOPRESSOR) 50 MG tablet Take 50 mg by mouth 2 (two) times daily.  11/16/14  Yes Historical Provider, MD  potassium chloride SA (K-DUR,KLOR-CON) 20 MEQ tablet Take 20 mEq by mouth daily.   Yes Historical Provider, MD  pravastatin (PRAVACHOL) 20 MG tablet Take 1 tablet (20 mg total) by mouth daily. 05/27/14  Yes Daniel J Angiulli, PA-C  sulfamethoxazole-trimethoprim (BACTRIM DS,SEPTRA DS) 800-160 MG per tablet Take 1 tablet by mouth 2 (two) times daily. 07/05/15 07/12/15 Yes Almyra Free Idol, PA-C  torsemide (DEMADEX) 20 MG tablet Take 1 tablet (20 mg total) by mouth daily. 05/01/15  Yes Herminio Commons, MD  traMADol (ULTRAM) 50 MG tablet Take 1 tablet (50 mg total) by mouth every 6 (six) hours as needed. Patient taking differently: Take 50 mg by mouth every 6 (six) hours as needed for moderate pain.  07/07/15  Yes Almyra Free Idol, PA-C  silver sulfADIAZINE (SILVADENE) 1 % cream Use to area BID Patient taking differently: Apply 1 application topically 2 (two) times daily. Use to area BID 11/17/14   Florian Buff, MD  triamcinolone cream (KENALOG) 0.1 % Apply 1 application topically 2 (two) times daily. Apply to blistered area of lesion Patient not taking: Reported on 07/10/2015 07/05/15   Evalee Jefferson, PA-C   BP 123/74 mmHg  Pulse 71  Temp(Src) 98.6 F (37 C) (Oral)  Resp 16   Ht 5\' 9"  (1.753 m)  Wt 360 lb (163.295 kg)  BMI 53.14 kg/m2  SpO2 99%   19:28:47 Orthostatic Vital Signs SW  Orthostatic Lying  - BP- Lying: 125/63 mmHg ; Pulse- Lying: 72  Orthostatic Sitting - BP- Sitting: 128/67 mmHg ; Pulse- Sitting: 69  Orthostatic Standing at 0 minutes - BP- Standing at 0 minutes: 110/73 mmHg ; Pulse- Standing at 0 minutes: 74      Physical Exam  1957: Physical examination:  Nursing notes reviewed; Vital signs and O2 SAT reviewed;  Constitutional: Well developed, Well nourished, In no acute distress; Head:  Normocephalic, atraumatic; Eyes: EOMI, PERRL, No scleral icterus; ENMT: Mouth and pharynx normal, Mucous membranes dry; Neck: Supple, Full range of motion, No lymphadenopathy; Cardiovascular: Regular rate and rhythm, No gallop; Respiratory: Breath sounds clear & equal bilaterally, No wheezes.  Speaking full sentences with ease, Normal  respiratory effort/excursion; Chest: Nontender, Movement normal; Abdomen: Soft, Nontender, Nondistended, Normal bowel sounds; Genitourinary: No CVA tenderness; Extremities: Pulses normal, No deformity. +area of flat blister to left lateral calf with central shallow open area, +smaller than pen markings from previous ED visits. No surrounding erythema, no drainage, no ecchymosis, no fluctuance, no streaking, no warmth. No calf tenderness, edema or asymmetry.; Neuro: AA&Ox3, Major CN grossly intact.  Speech clear. No gross focal motor or sensory deficits in extremities.; Skin: Color normal, Warm, Dry.   ED Course  Procedures     EKG Interpretation None      MDM  MDM Reviewed: previous chart, nursing note and vitals Reviewed previous: labs Interpretation: labs   Results for orders placed or performed during the hospital encounter of 07/10/15  Lactic acid, plasma  Result Value Ref Range   Lactic Acid, Venous 1.0 0.5 - 2.0 mmol/L  Basic metabolic panel  Result Value Ref Range   Sodium 142 135 - 145 mmol/L   Potassium 4.2  3.5 - 5.1 mmol/L   Chloride 107 101 - 111 mmol/L   CO2 26 22 - 32 mmol/L   Glucose, Bld 81 65 - 99 mg/dL   BUN 18 6 - 20 mg/dL   Creatinine, Ser 1.51 (H) 0.44 - 1.00 mg/dL   Calcium 9.1 8.9 - 10.3 mg/dL   GFR calc non Af Amer 40 (L) >60 mL/min   GFR calc Af Amer 47 (L) >60 mL/min   Anion gap 9 5 - 15  CBC with Differential  Result Value Ref Range   WBC 7.3 4.0 - 10.5 K/uL   RBC 4.69 3.87 - 5.11 MIL/uL   Hemoglobin 13.9 12.0 - 15.0 g/dL   HCT 41.7 36.0 - 46.0 %   MCV 88.9 78.0 - 100.0 fL   MCH 29.6 26.0 - 34.0 pg   MCHC 33.3 30.0 - 36.0 g/dL   RDW 13.3 11.5 - 15.5 %   Platelets 313 150 - 400 K/uL   Neutrophils Relative % 55 43 - 77 %   Neutro Abs 4.0 1.7 - 7.7 K/uL   Lymphocytes Relative 34 12 - 46 %   Lymphs Abs 2.5 0.7 - 4.0 K/uL   Monocytes Relative 5 3 - 12 %   Monocytes Absolute 0.3 0.1 - 1.0 K/uL   Eosinophils Relative 5 0 - 5 %   Eosinophils Absolute 0.4 0.0 - 0.7 K/uL   Basophils Relative 1 0 - 1 %   Basophils Absolute 0.1 0.0 - 0.1 K/uL  Urinalysis, Routine w reflex microscopic (not at [facility])  Result Value Ref Range   Color, Urine YELLOW YELLOW   APPearance CLEAR CLEAR   Specific Gravity, Urine 1.025 1.005 - 1.030   pH 5.5 5.0 - 8.0   Glucose, UA NEGATIVE NEGATIVE mg/dL   Hgb urine dipstick MODERATE (A) NEGATIVE   Bilirubin Urine NEGATIVE NEGATIVE   Ketones, ur NEGATIVE NEGATIVE mg/dL   Protein, ur TRACE (A) NEGATIVE mg/dL   Urobilinogen, UA 0.2 0.0 - 1.0 mg/dL   Nitrite NEGATIVE NEGATIVE   Leukocytes, UA TRACE (A) NEGATIVE  Urine microscopic-add on  Result Value Ref Range   Squamous Epithelial / LPF MANY (A) RARE   WBC, UA 3-6 <3 WBC/hpf   RBC / HPF 11-20 <3 RBC/hpf   Bacteria, UA FEW (A) RARE    2200:  LLE does not appear cellulitic at this time. Udip contaminated. Pt stated she "felt lightheaded" during orthostatic VS. BUN/Cr elevated. Judicious IVF given. Re-check I-stat chem and orthostatic  VS s/p IVF pending. Dispo based on results. Sign out to Dr.  Eulis Foster.      Francine Graven, DO 07/10/15 2205

## 2015-07-12 DIAGNOSIS — E039 Hypothyroidism, unspecified: Secondary | ICD-10-CM | POA: Diagnosis not present

## 2015-07-12 DIAGNOSIS — E785 Hyperlipidemia, unspecified: Secondary | ICD-10-CM | POA: Diagnosis not present

## 2015-07-12 DIAGNOSIS — E559 Vitamin D deficiency, unspecified: Secondary | ICD-10-CM | POA: Diagnosis not present

## 2015-07-12 DIAGNOSIS — E119 Type 2 diabetes mellitus without complications: Secondary | ICD-10-CM | POA: Diagnosis not present

## 2015-07-12 DIAGNOSIS — I5022 Chronic systolic (congestive) heart failure: Secondary | ICD-10-CM | POA: Diagnosis not present

## 2015-07-30 ENCOUNTER — Emergency Department (HOSPITAL_COMMUNITY)
Admission: EM | Admit: 2015-07-30 | Discharge: 2015-07-30 | Disposition: A | Discharge status: Home / Self Care | Payer: Medicare Other | Attending: Emergency Medicine | Admitting: Emergency Medicine

## 2015-07-30 ENCOUNTER — Encounter (HOSPITAL_COMMUNITY): Payer: Self-pay | Admitting: Emergency Medicine

## 2015-07-30 DIAGNOSIS — F329 Major depressive disorder, single episode, unspecified: Secondary | ICD-10-CM | POA: Diagnosis not present

## 2015-07-30 DIAGNOSIS — K088 Other specified disorders of teeth and supporting structures: Secondary | ICD-10-CM | POA: Diagnosis present

## 2015-07-30 DIAGNOSIS — Z79899 Other long term (current) drug therapy: Secondary | ICD-10-CM | POA: Diagnosis not present

## 2015-07-30 DIAGNOSIS — E119 Type 2 diabetes mellitus without complications: Secondary | ICD-10-CM | POA: Diagnosis not present

## 2015-07-30 DIAGNOSIS — I509 Heart failure, unspecified: Secondary | ICD-10-CM | POA: Diagnosis not present

## 2015-07-30 DIAGNOSIS — Z8742 Personal history of other diseases of the female genital tract: Secondary | ICD-10-CM | POA: Diagnosis not present

## 2015-07-30 DIAGNOSIS — J449 Chronic obstructive pulmonary disease, unspecified: Secondary | ICD-10-CM | POA: Diagnosis not present

## 2015-07-30 DIAGNOSIS — K0889 Other specified disorders of teeth and supporting structures: Secondary | ICD-10-CM

## 2015-07-30 DIAGNOSIS — F419 Anxiety disorder, unspecified: Secondary | ICD-10-CM | POA: Insufficient documentation

## 2015-07-30 DIAGNOSIS — I10 Essential (primary) hypertension: Secondary | ICD-10-CM | POA: Diagnosis not present

## 2015-07-30 DIAGNOSIS — Z862 Personal history of diseases of the blood and blood-forming organs and certain disorders involving the immune mechanism: Secondary | ICD-10-CM | POA: Diagnosis not present

## 2015-07-30 MED ORDER — BUPIVACAINE HCL (PF) 0.25 % IJ SOLN
20.0000 mL | Freq: Once | INTRAMUSCULAR | Status: AC
  Administered 2015-07-30: 20 mL
  Filled 2015-07-30: qty 30

## 2015-07-30 MED ORDER — IBUPROFEN 600 MG PO TABS: 600.0000 mg | ORAL_TABLET | Freq: Three times a day (TID) | ORAL | Status: DC | PRN

## 2015-07-30 MED ORDER — PENICILLIN V POTASSIUM 250 MG PO TABS
500.0000 mg | ORAL_TABLET | Freq: Once | ORAL | Status: AC
  Administered 2015-07-30: 500 mg via ORAL
  Filled 2015-07-30: qty 2

## 2015-07-30 MED ORDER — PENICILLIN V POTASSIUM 500 MG PO TABS: 500.0000 mg | ORAL_TABLET | Freq: Three times a day (TID) | ORAL | Status: DC

## 2015-07-30 MED ORDER — IBUPROFEN 800 MG PO TABS
800.0000 mg | ORAL_TABLET | Freq: Once | ORAL | Status: AC
  Administered 2015-07-30: 800 mg via ORAL
  Filled 2015-07-30: qty 1

## 2015-07-30 NOTE — ED Notes (Signed)
Pt alert & oriented x4, stable gait. Patient given discharge instructions, paperwork & prescription(s). Patient  instructed to stop at the registration desk to finish any additional paperwork. Patient verbalized understanding. Pt left department w/ no further questions. 

## 2015-07-30 NOTE — Discharge Instructions (Signed)

## 2015-07-30 NOTE — ED Provider Notes (Signed)
CSN: [id number]     Arrival date & time 07/30/15  0039 History  This chart was scribed for Jola Schmidt, MD by Irene Pap, ED Scribe. This patient was seen in room APA19/[facility]19 and patient care was started at 12:46 AM.   Chief Complaint  Patient presents with  . Dental Problem   The history is provided by the patient. No language interpreter was used.   HPI Comments: Erin Sexton is a 44 y.o. female with a hx of COPD, DM, HOCM, SVT, NSVT, and CHF who presents to the Emergency Department complaining of right lower 2nd molar pain onset one day ago Pt states that she has been trying to see a dentist but has unable to PTA in the ED. She believes that the tooth might be abscessed. Reports using 4 Aleve, tramadol and oxycodone to relief yesterday but no relief today. She states that salt rinses are also ineffective. She denies any other symptoms.   Past Medical History  Diagnosis Date  . COPD (chronic obstructive pulmonary disease)   . Depression   . Hypertension   . Asthma   . Anxiety   . Anemia   . Blood transfusion without reported diagnosis   . Diabetes mellitus without complication     a. Dx 02/2014.  [name] HOCM (hypertrophic obstructive cardiomyopathy)     a. Dx 95638.  . Vaginal bleeding   . SVT (supraventricular tachycardia)     a. Noted on tele during 02/2014 adm for symptomatic anemia.  [name] NSVT (nonsustained ventricular tachycardia)     a. Noted on tele during 02/2014 adm for symptomatic anemia.  . Morbid obesity   . Abnormal CT scan     a. 02/2014: evaluated for dyspnea and initially placed on Xarelto for PE because radiologist could not exclude small peripheral pulmonary emboli because of limited contrast. However, after admission for ++vaginal bleeding several days later, it was felt she did NOT have PE - underwent further testing with neg VQ 03/06/14, neg LE duplex.  . CHF (congestive heart failure)   . History of echocardiogram 2015    normal EF, Grade II diastolic  dysfunction   No past surgical history on file. Family History  Problem Relation Age of Onset  . CAD Neg Hx     No family history of EARLY CAD  . Diabetes Mother   . Arthritis Mother   . Other Mother     WPW  . Uterine cancer Maternal Grandmother    Social History  Substance Use Topics  . Smoking status: Never Smoker   . Smokeless tobacco: Never Used  . Alcohol Use: No   OB History    Gravida Para Term Preterm AB TAB SAB Ectopic Multiple Living            0     Review of Systems  HENT: Positive for dental problem.   All other systems reviewed and are negative.  Allergies  Review of patient's allergies indicates no known allergies.  Home Medications   Prior to Admission medications   Medication Sig Start Date End Date Taking? Authorizing Provider  albuterol (PROVENTIL HFA;VENTOLIN HFA) 108 (90 BASE) MCG/ACT inhaler Inhale 2 puffs into the lungs every 6 (six) hours as needed for wheezing or shortness of breath.    Historical Provider, MD  buPROPion (WELLBUTRIN SR) 150 MG 12 hr tablet TAKE ONE TABLET BY MOUTH TWICE DAILY 06/01/15   Historical Provider, MD  Cholecalciferol (VITAMIN D) 2000 UNITS CAPS Take 1 capsule by mouth  daily.    Historical Provider, MD  fluticasone (FLOVENT HFA) 110 MCG/ACT inhaler Inhale 2 puffs into the lungs 2 (two) times daily. Patient taking differently: Inhale 2 puffs into the lungs daily as needed (FOR SHORTNESS OF BREATH).  03/03/14   Kathie Dike, MD  glipiZIDE (GLUCOTROL) 5 MG tablet Take 5 mg by mouth 2 (two) times daily before a meal.     Historical Provider, MD  levothyroxine (SYNTHROID, LEVOTHROID) 125 MCG tablet TK 1 T PO QD 04/26/15   Historical Provider, MD  megestrol (MEGACE) 40 MG tablet Take two tablets by mouth twice daily Patient taking differently: Take 80 mg by mouth 2 (two) times daily. Take two tablets by mouth twice daily 04/07/15   Florian Buff, MD  metFORMIN (GLUCOPHAGE) 500 MG tablet Take 1 tablet (500 mg total) by mouth 2  (two) times daily with a meal. 05/27/14   Lavon Paganini Angiulli, PA-C  metoprolol (LOPRESSOR) 50 MG tablet Take 50 mg by mouth 2 (two) times daily.  11/16/14   Historical Provider, MD  potassium chloride SA (K-DUR,KLOR-CON) 20 MEQ tablet Take 20 mEq by mouth daily.    Historical Provider, MD  pravastatin (PRAVACHOL) 20 MG tablet Take 1 tablet (20 mg total) by mouth daily. 05/27/14   Lavon Paganini Angiulli, PA-C  silver sulfADIAZINE (SILVADENE) 1 % cream Use to area BID Patient taking differently: Apply 1 application topically 2 (two) times daily. Use to area BID 11/17/14   Florian Buff, MD  torsemide (DEMADEX) 20 MG tablet Take 1 tablet (20 mg total) by mouth daily. 05/01/15   Herminio Commons, MD  traMADol (ULTRAM) 50 MG tablet Take 1 tablet (50 mg total) by mouth every 6 (six) hours as needed. Patient taking differently: Take 50 mg by mouth every 6 (six) hours as needed for moderate pain.  07/07/15   Evalee Jefferson, PA-C  triamcinolone cream (KENALOG) 0.1 % Apply 1 application topically 2 (two) times daily. Apply to blistered area of lesion Patient not taking: Reported on 07/10/2015 07/05/15   Evalee Jefferson, PA-C   BP 132/68 mmHg  Pulse 67  Temp(Src) 98 F (36.7 C) (Oral)  Resp 20  Ht 5\' 9"  (1.753 m)  Wt 360 lb (163.295 kg)  BMI 53.14 kg/m2  SpO2 97%  Physical Exam  Constitutional: She is oriented to person, place, and time. She appears well-developed and well-nourished.  HENT:  Head: Normocephalic and atraumatic.  Tenderness of the right lower second molar, no gingival swelling or fluctuance  Eyes: EOM are normal.  Neck: Normal range of motion.  Cardiovascular: Normal rate.   Pulmonary/Chest: Effort normal.  Abdominal: She exhibits no distension.  Musculoskeletal: Normal range of motion.  Neurological: She is alert and oriented to person, place, and time.  Skin: Skin is warm and dry.  Psychiatric: She has a normal mood and affect. Her behavior is normal.  Nursing note and vitals reviewed.   ED  Course  Procedures (including critical care time)  DENTAL NERVE BLOCK Performed by: [name] Consent: Verbal consent obtained. Required items: required blood products, implants, devices, and special equipment available Time out: Immediately prior to procedure a "time out" was called to verify the correct patient, procedure, equipment, support staff and site/side marked as required. Indication: dental pain Nerve block body site: right lower second molar Preparation: Patient was prepped and draped in the usual sterile fashion. Needle gauge: 24 G Location technique: anatomical landmarks Local anesthetic: bupivicaine 0.25% Anesthetic total: 3 ml Outcome: pain improved Patient tolerance:  Patient tolerated the procedure well with no immediate complications.     DIAGNOSTIC STUDIES: Oxygen Saturation is 97% on RA, normal by my interpretation.    COORDINATION OF CARE: 12:48 AM-Discussed treatment plan which includes antibiotics with pt at bedside and pt agreed to plan.   Labs Review Labs Reviewed - No data to display  Imaging Review No results found.    EKG Interpretation None      MDM   Final diagnoses:  None    Dental Pain. Home with antibiotics and pain medicine. Recommend dental follow up. No signs of gingival abscess. Tolerating secretions. Airway patent. No sub lingular swelling   I personally performed the services described in this documentation, which was scribed in my presence. The recorded information has been reviewed and is accurate.        Jola Schmidt, MD 07/30/15 [phone]

## 2015-07-30 NOTE — ED Notes (Signed)
Patient complaining of dental pain to bottom right that started Friday.

## 2015-08-03 ENCOUNTER — Emergency Department (HOSPITAL_COMMUNITY)
Admission: EM | Admit: 2015-08-03 | Discharge: 2015-08-03 | Disposition: A | Discharge status: Home / Self Care | Payer: Medicare Other | Attending: Emergency Medicine | Admitting: Emergency Medicine

## 2015-08-03 ENCOUNTER — Encounter (HOSPITAL_COMMUNITY): Payer: Self-pay | Admitting: Emergency Medicine

## 2015-08-03 DIAGNOSIS — Z792 Long term (current) use of antibiotics: Secondary | ICD-10-CM | POA: Insufficient documentation

## 2015-08-03 DIAGNOSIS — I509 Heart failure, unspecified: Secondary | ICD-10-CM | POA: Diagnosis not present

## 2015-08-03 DIAGNOSIS — K029 Dental caries, unspecified: Secondary | ICD-10-CM | POA: Diagnosis not present

## 2015-08-03 DIAGNOSIS — F419 Anxiety disorder, unspecified: Secondary | ICD-10-CM | POA: Diagnosis not present

## 2015-08-03 DIAGNOSIS — Z862 Personal history of diseases of the blood and blood-forming organs and certain disorders involving the immune mechanism: Secondary | ICD-10-CM | POA: Diagnosis not present

## 2015-08-03 DIAGNOSIS — E119 Type 2 diabetes mellitus without complications: Secondary | ICD-10-CM | POA: Diagnosis not present

## 2015-08-03 DIAGNOSIS — J449 Chronic obstructive pulmonary disease, unspecified: Secondary | ICD-10-CM | POA: Diagnosis not present

## 2015-08-03 DIAGNOSIS — Z8659 Personal history of other mental and behavioral disorders: Secondary | ICD-10-CM | POA: Diagnosis not present

## 2015-08-03 DIAGNOSIS — K0889 Other specified disorders of teeth and supporting structures: Secondary | ICD-10-CM

## 2015-08-03 DIAGNOSIS — Z8742 Personal history of other diseases of the female genital tract: Secondary | ICD-10-CM | POA: Diagnosis not present

## 2015-08-03 DIAGNOSIS — Z79899 Other long term (current) drug therapy: Secondary | ICD-10-CM | POA: Insufficient documentation

## 2015-08-03 DIAGNOSIS — I10 Essential (primary) hypertension: Secondary | ICD-10-CM | POA: Diagnosis not present

## 2015-08-03 DIAGNOSIS — K088 Other specified disorders of teeth and supporting structures: Secondary | ICD-10-CM | POA: Diagnosis present

## 2015-08-03 MED ORDER — LIDOCAINE HCL (PF) 1 % IJ SOLN
INTRAMUSCULAR | Status: AC
  Administered 2015-08-03: 5 mL
  Filled 2015-08-03: qty 5

## 2015-08-03 MED ORDER — CEFTRIAXONE SODIUM 1 G IJ SOLR
1.0000 g | Freq: Once | INTRAMUSCULAR | Status: AC
  Administered 2015-08-03: 1 g via INTRAMUSCULAR
  Filled 2015-08-03: qty 10

## 2015-08-03 MED ORDER — HYDROCODONE-ACETAMINOPHEN 5-325 MG PO TABS
2.0000 | ORAL_TABLET | Freq: Once | ORAL | Status: AC
  Administered 2015-08-03: 2 via ORAL
  Filled 2015-08-03: qty 2

## 2015-08-03 MED ORDER — HYDROCODONE-ACETAMINOPHEN 5-325 MG PO TABS: 1.0000 | ORAL_TABLET | ORAL | Status: DC | PRN

## 2015-08-03 NOTE — Discharge Instructions (Signed)
Your vital signs are within normal limits. You were treated with intramuscular Rocephin tonight. There is no visible abscess noted on your examination at this time. There no significant palpable lymph nodes appreciated at this time. Please see the oral surgeon as suggested. Please call Dr.Gosrani tomorrow and discuss with him possible pain management. Dental Caries Dental caries (also called tooth decay) is the most common oral disease. It can occur at any age but is more common in children and young adults.  HOW DENTAL CARIES DEVELOPS  The process of decay begins when bacteria and foods (particularly sugars and starches) combine in your mouth to produce plaque. Plaque is a substance that sticks to the hard, outer surface of a tooth (enamel). The bacteria in plaque produce acids that attack enamel. These acids may also attack the root surface of a tooth (cementum) if it is exposed. Repeated attacks dissolve these surfaces and create holes in the tooth (cavities). If left untreated, the acids destroy the other layers of the tooth.  RISK FACTORS  Frequent sipping of sugary beverages.   Frequent snacking on sugary and starchy foods, especially those that easily get stuck in the teeth.   Poor oral hygiene.   Dry mouth.   Substance abuse such as methamphetamine abuse.   Broken or poor-fitting dental restorations.   Eating disorders.   Gastroesophageal reflux disease (GERD).   Certain radiation treatments to the head and neck. SYMPTOMS In the early stages of dental caries, symptoms are seldom present. Sometimes white, chalky areas may be seen on the enamel or other tooth layers. In later stages, symptoms may include:  Pits and holes on the enamel.  Toothache after sweet, hot, or cold foods or drinks are consumed.  Pain around the tooth.  Swelling around the tooth. DIAGNOSIS  Most of the time, dental caries is detected during a regular dental checkup. A diagnosis is made after a  thorough medical and dental history is taken and the surfaces of your teeth are checked for signs of dental caries. Sometimes special instruments, such as lasers, are used to check for dental caries. Dental X-ray exams may be taken so that areas not visible to the eye (such as between the contact areas of the teeth) can be checked for cavities.  TREATMENT  If dental caries is in its early stages, it may be reversed with a fluoride treatment or an application of a remineralizing agent at the dental office. Thorough brushing and flossing at home is needed to aid these treatments. If it is in its later stages, treatment depends on the location and extent of tooth destruction:   If a small area of the tooth has been destroyed, the destroyed area will be removed and cavities will be filled with a material such as gold, silver amalgam, or composite resin.   If a large area of the tooth has been destroyed, the destroyed area will be removed and a cap (crown) will be fitted over the remaining tooth structure.   If the center part of the tooth (pulp) is affected, a procedure called a root canal will be needed before a filling or crown can be placed.   If most of the tooth has been destroyed, the tooth may need to be pulled (extracted). HOME CARE INSTRUCTIONS You can prevent, stop, or reverse dental caries at home by practicing good oral hygiene. Good oral hygiene includes:  Thoroughly cleaning your teeth at least twice a day with a toothbrush and dental floss.   Using a  fluoride toothpaste. A fluoride mouth rinse may also be used if recommended by your dentist or health care provider.   Restricting the amount of sugary and starchy foods and sugary liquids you consume.   Avoiding frequent snacking on these foods and sipping of these liquids.   Keeping regular visits with a dentist for checkups and cleanings. PREVENTION   Practice good oral hygiene.  Consider a dental sealant. A dental sealant  is a coating material that is applied by your dentist to the pits and grooves of teeth. The sealant prevents food from being trapped in them. It may protect the teeth for several years.  Ask about fluoride supplements if you live in a community without fluorinated water or with water that has a low fluoride content. Use fluoride supplements as directed by your dentist or health care provider.  Allow fluoride varnish applications to teeth if directed by your dentist or health care provider. Document Released: 08/17/2002 Document Revised: 04/11/2014 Document Reviewed: 11/27/2012 [facility] Patient Information 2015 [place], [place]. This information is not intended to replace advice given to you by your health care provider. Make sure you discuss any questions you have with your health care provider.

## 2015-08-03 NOTE — ED Provider Notes (Signed)
CSN: [id number]     Arrival date & time 08/03/15  1955 History   First MD Initiated Contact with Patient 08/03/15 2041     Chief Complaint  Patient presents with  . Dental Pain     (Consider location/radiation/quality/duration/timing/severity/associated sxs/prior Treatment) HPI Comments:  Patient is a 44 year old Sexton who presents to the emergency department with a complaint of dental pain.  The patient states that she has been having problems with her teeth for a few weeks now. She was seen in the emergency department on August 21, at which time she had a dental block and pain medication and why Rx given. She states that she was seen by the dentist today, and was told she would have to see an oral surgeon for assistance with her dental issues. She states that the surgeon cannot see her until August 30. She states she feels like she cannot take the pain for that long. Her anabiotic was changed to clindamycin, and she was increased from ibuprofen 600 to ibuprofen 800 mg. She states that so far these are not helping. She has not had high fever or vomiting reported.  Patient is a 44 y.o. Sexton presenting with tooth pain. The history is provided by the patient.  Dental Pain   Past Medical History  Diagnosis Date  . COPD (chronic obstructive pulmonary disease)   . Depression   . Hypertension   . Asthma   . Anxiety   . Anemia   . Blood transfusion without reported diagnosis   . Diabetes mellitus without complication     a. Dx 02/2014.  [name] HOCM (hypertrophic obstructive cardiomyopathy)     a. Dx 01601.  . Vaginal bleeding   . SVT (supraventricular tachycardia)     a. Noted on tele during 02/2014 adm for symptomatic anemia.  [name] NSVT (nonsustained ventricular tachycardia)     a. Noted on tele during 02/2014 adm for symptomatic anemia.  . Morbid obesity   . Abnormal CT scan     a. 02/2014: evaluated for dyspnea and initially placed on Xarelto for PE because radiologist could not exclude  small peripheral pulmonary emboli because of limited contrast. However, after admission for ++vaginal bleeding several days later, it was felt she did NOT have PE - underwent further testing with neg VQ 03/06/14, neg LE duplex.  . CHF (congestive heart failure)   . History of echocardiogram 2015    normal EF, Grade II diastolic dysfunction   History reviewed. No pertinent past surgical history. Family History  Problem Relation Age of Onset  . CAD Neg Hx     No family history of EARLY CAD  . Diabetes Mother   . Arthritis Mother   . Other Mother     WPW  . Uterine cancer Maternal Grandmother    Social History  Substance Use Topics  . Smoking status: Never Smoker   . Smokeless tobacco: Never Used  . Alcohol Use: No   OB History    Gravida Para Term Preterm AB TAB SAB Ectopic Multiple Living            0     Review of Systems  HENT: Positive for dental problem.   Psychiatric/Behavioral: The patient is nervous/anxious.        Depression  All other systems reviewed and are negative.     Allergies  Review of patient's allergies indicates no known allergies.  Home Medications   Prior to Admission medications   Medication Sig Start Date End Date  Taking? Authorizing Provider  albuterol (PROVENTIL HFA;VENTOLIN HFA) 108 (90 BASE) MCG/ACT inhaler Inhale 2 puffs into the lungs every 6 (six) hours as needed for wheezing or shortness of breath.    Historical Provider, MD  buPROPion (WELLBUTRIN SR) 150 MG 12 hr tablet TAKE ONE TABLET BY MOUTH TWICE DAILY 06/01/15   Historical Provider, MD  Cholecalciferol (VITAMIN D) 2000 UNITS CAPS Take 1 capsule by mouth daily.    Historical Provider, MD  fluticasone (FLOVENT HFA) 110 MCG/ACT inhaler Inhale 2 puffs into the lungs 2 (two) times daily. Patient taking differently: Inhale 2 puffs into the lungs daily as needed (FOR SHORTNESS OF BREATH).  03/03/14   Kathie Dike, MD  glipiZIDE (GLUCOTROL) 5 MG tablet Take 5 mg by mouth 2 (two) times daily  before a meal.     Historical Provider, MD  ibuprofen (ADVIL,MOTRIN) 600 MG tablet Take 1 tablet (600 mg total) by mouth every 8 (eight) hours as needed. 07/30/15   Jola Schmidt, MD  levothyroxine (SYNTHROID, LEVOTHROID) 125 MCG tablet TK 1 T PO QD 04/26/15   Historical Provider, MD  megestrol (MEGACE) 40 MG tablet Take two tablets by mouth twice daily Patient taking differently: Take 80 mg by mouth 2 (two) times daily. Take two tablets by mouth twice daily 04/07/15   Florian Buff, MD  metFORMIN (GLUCOPHAGE) 500 MG tablet Take 1 tablet (500 mg total) by mouth 2 (two) times daily with a meal. 05/27/14   Lavon Paganini Angiulli, PA-C  metoprolol (LOPRESSOR) 50 MG tablet Take 50 mg by mouth 2 (two) times daily.  11/16/14   Historical Provider, MD  penicillin v potassium (VEETID) 500 MG tablet Take 1 tablet (500 mg total) by mouth 3 (three) times daily. 07/30/15   Jola Schmidt, MD  potassium chloride SA (K-DUR,KLOR-CON) Erin MEQ tablet Take Erin mEq by mouth daily.    Historical Provider, MD  pravastatin (PRAVACHOL) Erin MG tablet Take 1 tablet (Erin mg total) by mouth daily. 05/27/14   Lavon Paganini Angiulli, PA-C  silver sulfADIAZINE (SILVADENE) 1 % cream Use to area BID Patient taking differently: Apply 1 application topically 2 (two) times daily. Use to area BID 11/17/14   Florian Buff, MD  torsemide (DEMADEX) Erin MG tablet Take 1 tablet (Erin mg total) by mouth daily. 05/01/15   Herminio Commons, MD  traMADol (ULTRAM) 50 MG tablet Take 1 tablet (50 mg total) by mouth every 6 (six) hours as needed. Patient taking differently: Take 50 mg by mouth every 6 (six) hours as needed for moderate pain.  07/07/15   Evalee Jefferson, PA-C  triamcinolone cream (KENALOG) 0.1 % Apply 1 application topically 2 (two) times daily. Apply to blistered area of lesion Patient not taking: Reported on 07/10/2015 07/05/15   Evalee Jefferson, PA-C   BP 160/85 mmHg  Pulse 75  Temp(Src) 98.4 F (36.9 C) (Oral)  Resp 24  Ht 5\' 9"  (1.753 m)  Wt 360 lb (163.295  kg)  BMI 53.14 kg/m2  SpO2 100% Physical Exam  Constitutional: She is oriented to person, place, and time. She appears well-developed and well-nourished.  Non-toxic appearance.  Pt is tearful due to pain.  HENT:  Head: Normocephalic.  Right Ear: Tympanic membrane and external ear normal.  Left Ear: Tympanic membrane and external ear normal.  The airway is patent. There is pain to palpation over the right lower molar area. No visible abscess appreciated. The airway is patent. There is no trismus noted. Is no swelling under the tongue.  No facial swelling.  Eyes: EOM and lids are normal. Pupils are equal, round, and reactive to light.  Neck: Normal range of motion. Neck supple. Carotid bruit is not present.  Cardiovascular: Normal rate, regular rhythm, normal heart sounds, intact distal pulses and normal pulses.   Pulmonary/Chest: Breath sounds normal. No respiratory distress.  Abdominal: Soft. Bowel sounds are normal. There is no tenderness. There is no guarding.  Musculoskeletal: Normal range of motion.  Lymphadenopathy:       Head (right side): No submandibular adenopathy present.       Head (left side): No submandibular adenopathy present.    She has no cervical adenopathy.  Neurological: She is alert and oriented to person, place, and time. She has normal strength. No cranial nerve deficit or sensory deficit.  Skin: Skin is warm and dry.  Psychiatric: She has a normal mood and affect. Her speech is normal.  Nursing note and vitals reviewed.   ED Course  Procedures (including critical care time) Labs Review Labs Reviewed - No data to display  Imaging Review No results found. I have personally reviewed and evaluated these images and lab results as part of my medical decision-making.   EKG Interpretation None      MDM The vital signs have been reviewed. Pt has a deep cavity of the right lower molar.  No evidence for Ludwig's angina. The patient has been on oral amoxil.  Will give IM Rocephin tonight in view of increasing pain and hx of DM. Will give 10 tabs of Norco. Pt to call Dr Sharren Bridge tomorrow for appointment concerning pain management. Pt strongly advised to keep appoint with oral surgeon.   Final diagnoses:  None    *I have reviewed nursing notes, vital signs, and all appropriate lab and imaging results for this patient.[address] PA-C 08/03/15 2143  Milton Ferguson, MD 08/07/15 [phone]

## 2015-08-03 NOTE — ED Notes (Signed)
Started having pain last week.  Was seen here on Saturday and went to dentist today.  Was told she would need to see oral surgeon next week.  States the pain in intolerable.

## 2015-08-04 ENCOUNTER — Encounter (HOSPITAL_COMMUNITY): Payer: Self-pay | Admitting: *Deleted

## 2015-08-04 ENCOUNTER — Emergency Department (HOSPITAL_COMMUNITY)
Admission: EM | Admit: 2015-08-04 | Discharge: 2015-08-04 | Disposition: A | Discharge status: Home / Self Care | Payer: Medicare Other | Attending: Emergency Medicine | Admitting: Emergency Medicine

## 2015-08-04 DIAGNOSIS — K088 Other specified disorders of teeth and supporting structures: Secondary | ICD-10-CM | POA: Insufficient documentation

## 2015-08-04 DIAGNOSIS — Z792 Long term (current) use of antibiotics: Secondary | ICD-10-CM | POA: Insufficient documentation

## 2015-08-04 DIAGNOSIS — F329 Major depressive disorder, single episode, unspecified: Secondary | ICD-10-CM | POA: Diagnosis not present

## 2015-08-04 DIAGNOSIS — J449 Chronic obstructive pulmonary disease, unspecified: Secondary | ICD-10-CM | POA: Insufficient documentation

## 2015-08-04 DIAGNOSIS — E119 Type 2 diabetes mellitus without complications: Secondary | ICD-10-CM | POA: Diagnosis not present

## 2015-08-04 DIAGNOSIS — I10 Essential (primary) hypertension: Secondary | ICD-10-CM | POA: Insufficient documentation

## 2015-08-04 DIAGNOSIS — I509 Heart failure, unspecified: Secondary | ICD-10-CM | POA: Insufficient documentation

## 2015-08-04 DIAGNOSIS — F419 Anxiety disorder, unspecified: Secondary | ICD-10-CM | POA: Diagnosis not present

## 2015-08-04 DIAGNOSIS — K0889 Other specified disorders of teeth and supporting structures: Secondary | ICD-10-CM

## 2015-08-04 DIAGNOSIS — Z79899 Other long term (current) drug therapy: Secondary | ICD-10-CM | POA: Diagnosis not present

## 2015-08-04 DIAGNOSIS — Z8742 Personal history of other diseases of the female genital tract: Secondary | ICD-10-CM | POA: Diagnosis not present

## 2015-08-04 NOTE — Discharge Instructions (Signed)

## 2015-08-04 NOTE — ED Provider Notes (Signed)
CSN: [id number]     Arrival date & time 08/04/15  1410 History   First MD Initiated Contact with Patient 08/04/15 1417     Chief Complaint  Patient presents with  . Dental Pain     (Consider location/radiation/quality/duration/timing/severity/associated sxs/prior Treatment) HPI Comments: Pt comes in with right lower dental pain times a couple of weeks. She states that this is her third visit to the er and she has also seen a dentist. She was told that she needs to see and oral Psychologist, sport and exercise. Denies problems swallowing or breathing. No fever. She is on clindamycin and hydrocodone. She state that we tried to do a dental block that didn't work. She has an appt with oral surgeon in 1.5 hours.  The history is provided by the patient. No language interpreter was used.    Past Medical History  Diagnosis Date  . COPD (chronic obstructive pulmonary disease)   . Depression   . Hypertension   . Asthma   . Anxiety   . Anemia   . Blood transfusion without reported diagnosis   . Diabetes mellitus without complication     a. Dx 02/2014.  [name] HOCM (hypertrophic obstructive cardiomyopathy)     a. Dx 62376.  . Vaginal bleeding   . SVT (supraventricular tachycardia)     a. Noted on tele during 02/2014 adm for symptomatic anemia.  [name] NSVT (nonsustained ventricular tachycardia)     a. Noted on tele during 02/2014 adm for symptomatic anemia.  . Morbid obesity   . Abnormal CT scan     a. 02/2014: evaluated for dyspnea and initially placed on Xarelto for PE because radiologist could not exclude small peripheral pulmonary emboli because of limited contrast. However, after admission for ++vaginal bleeding several days later, it was felt she did NOT have PE - underwent further testing with neg VQ 03/06/14, neg LE duplex.  . CHF (congestive heart failure)   . History of echocardiogram 2015    normal EF, Grade II diastolic dysfunction   History reviewed. No pertinent past surgical history. Family History  Problem  Relation Age of Onset  . CAD Neg Hx     No family history of EARLY CAD  . Diabetes Mother   . Arthritis Mother   . Other Mother     WPW  . Uterine cancer Maternal Grandmother    Social History  Substance Use Topics  . Smoking status: Never Smoker   . Smokeless tobacco: Never Used  . Alcohol Use: No   OB History    Gravida Para Term Preterm AB TAB SAB Ectopic Multiple Living            0     Review of Systems  All other systems reviewed and are negative.     Allergies  Review of patient's allergies indicates no known allergies.  Home Medications   Prior to Admission medications   Medication Sig Start Date End Date Taking? Authorizing Provider  albuterol (PROVENTIL HFA;VENTOLIN HFA) 108 (90 BASE) MCG/ACT inhaler Inhale 2 puffs into the lungs every 6 (six) hours as needed for wheezing or shortness of breath.    Historical Provider, MD  buPROPion (WELLBUTRIN SR) 150 MG 12 hr tablet TAKE ONE TABLET BY MOUTH TWICE DAILY 06/01/15   Historical Provider, MD  Cholecalciferol (VITAMIN D) 2000 UNITS CAPS Take 1 capsule by mouth daily.    Historical Provider, MD  fluticasone (FLOVENT HFA) 110 MCG/ACT inhaler Inhale 2 puffs into the lungs 2 (two) times daily. Patient taking  differently: Inhale 2 puffs into the lungs daily as needed (FOR SHORTNESS OF BREATH).  03/03/14   Kathie Dike, MD  glipiZIDE (GLUCOTROL) 5 MG tablet Take 5 mg by mouth 2 (two) times daily before a meal.     Historical Provider, MD  HYDROcodone-acetaminophen (NORCO/VICODIN) 5-325 MG per tablet Take 1 tablet by mouth every 4 (four) hours as needed. 08/03/15   Lily Kocher, PA-C  ibuprofen (ADVIL,MOTRIN) 600 MG tablet Take 1 tablet (600 mg total) by mouth every 8 (eight) hours as needed. 07/30/15   Jola Schmidt, MD  levothyroxine (SYNTHROID, LEVOTHROID) 125 MCG tablet TK 1 T PO QD 04/26/15   Historical Provider, MD  megestrol (MEGACE) 40 MG tablet Take two tablets by mouth twice daily Patient taking differently: Take 80  mg by mouth 2 (two) times daily. Take two tablets by mouth twice daily 04/07/15   Florian Buff, MD  metFORMIN (GLUCOPHAGE) 500 MG tablet Take 1 tablet (500 mg total) by mouth 2 (two) times daily with a meal. 05/27/14   Lavon Paganini Angiulli, PA-C  metoprolol (LOPRESSOR) 50 MG tablet Take 50 mg by mouth 2 (two) times daily.  11/16/14   Historical Provider, MD  penicillin v potassium (VEETID) 500 MG tablet Take 1 tablet (500 mg total) by mouth 3 (three) times daily. 07/30/15   Jola Schmidt, MD  potassium chloride SA (K-DUR,KLOR-CON) 20 MEQ tablet Take 20 mEq by mouth daily.    Historical Provider, MD  pravastatin (PRAVACHOL) 20 MG tablet Take 1 tablet (20 mg total) by mouth daily. 05/27/14   Lavon Paganini Angiulli, PA-C  silver sulfADIAZINE (SILVADENE) 1 % cream Use to area BID Patient taking differently: Apply 1 application topically 2 (two) times daily. Use to area BID 11/17/14   Florian Buff, MD  torsemide (DEMADEX) 20 MG tablet Take 1 tablet (20 mg total) by mouth daily. 05/01/15   Herminio Commons, MD  traMADol (ULTRAM) 50 MG tablet Take 1 tablet (50 mg total) by mouth every 6 (six) hours as needed. Patient taking differently: Take 50 mg by mouth every 6 (six) hours as needed for moderate pain.  07/07/15   Evalee Jefferson, PA-C  triamcinolone cream (KENALOG) 0.1 % Apply 1 application topically 2 (two) times daily. Apply to blistered area of lesion Patient not taking: Reported on 07/10/2015 07/05/15   Evalee Jefferson, PA-C   BP 144/84 mmHg  Pulse 64  Temp(Src) 97.8 F (36.6 C) (Oral)  Resp 20  Wt 360 lb (163.295 kg)  SpO2 99% Physical Exam  Constitutional: She is oriented to person, place, and time. She appears well-developed and well-nourished.  HENT:  Head: Atraumatic.  Right Ear: External ear normal.  Left Ear: External ear normal.  No gross deformity or swelling noted.  Cardiovascular: Normal rate and regular rhythm.   Pulmonary/Chest: Effort normal and breath sounds normal.  Musculoskeletal: Normal  range of motion.  Neurological: She is oriented to person, place, and time.  Nursing note and vitals reviewed.   ED Course  Procedures (including critical care time) Labs Review Labs Reviewed - No data to display  Imaging Review No results found. I have personally reviewed and evaluated these images and lab results as part of my medical decision-making.   EKG Interpretation None      MDM   Final diagnoses:  Pain, dental    Pt told to follow up with oral surgery. No sign of ludwigs angina   Glendell Docker, NP 08/04/15 1434  Merrily Pew, MD 08/04/15 1840

## 2015-08-04 NOTE — ED Notes (Signed)
Pt states ongoing dental pain with recent ED visits. Pt states she has an appt with oral surgeon at 1600 today but the office has not verified the appt, per pt.

## 2015-08-25 ENCOUNTER — Other Ambulatory Visit: Payer: Self-pay | Admitting: Obstetrics & Gynecology

## 2015-08-28 ENCOUNTER — Telehealth: Payer: Self-pay | Admitting: Obstetrics & Gynecology

## 2015-08-28 ENCOUNTER — Telehealth: Payer: Self-pay | Admitting: *Deleted

## 2015-08-28 NOTE — Telephone Encounter (Signed)
Pt requesting refill on Megace 40 mg.

## 2015-08-28 NOTE — Telephone Encounter (Signed)
Pt informed Megace e-scribed.  

## 2015-08-28 NOTE — Telephone Encounter (Signed)
done

## 2015-10-09 DIAGNOSIS — Z23 Encounter for immunization: Secondary | ICD-10-CM | POA: Diagnosis not present

## 2015-10-12 DIAGNOSIS — E039 Hypothyroidism, unspecified: Secondary | ICD-10-CM | POA: Diagnosis not present

## 2015-10-12 DIAGNOSIS — I5022 Chronic systolic (congestive) heart failure: Secondary | ICD-10-CM | POA: Diagnosis not present

## 2015-10-12 DIAGNOSIS — E559 Vitamin D deficiency, unspecified: Secondary | ICD-10-CM | POA: Diagnosis not present

## 2015-10-12 DIAGNOSIS — E785 Hyperlipidemia, unspecified: Secondary | ICD-10-CM | POA: Diagnosis not present

## 2015-10-12 DIAGNOSIS — E119 Type 2 diabetes mellitus without complications: Secondary | ICD-10-CM | POA: Diagnosis not present

## 2015-11-14 DIAGNOSIS — E119 Type 2 diabetes mellitus without complications: Secondary | ICD-10-CM | POA: Diagnosis not present

## 2015-11-22 DIAGNOSIS — E039 Hypothyroidism, unspecified: Secondary | ICD-10-CM | POA: Diagnosis not present

## 2015-11-22 DIAGNOSIS — I5022 Chronic systolic (congestive) heart failure: Secondary | ICD-10-CM | POA: Diagnosis not present

## 2015-11-22 DIAGNOSIS — E119 Type 2 diabetes mellitus without complications: Secondary | ICD-10-CM | POA: Diagnosis not present

## 2015-11-22 DIAGNOSIS — R5383 Other fatigue: Secondary | ICD-10-CM | POA: Diagnosis not present

## 2015-12-05 DIAGNOSIS — J209 Acute bronchitis, unspecified: Secondary | ICD-10-CM | POA: Diagnosis not present

## 2015-12-24 ENCOUNTER — Other Ambulatory Visit: Payer: Self-pay | Admitting: Obstetrics & Gynecology

## 2016-01-24 DIAGNOSIS — E559 Vitamin D deficiency, unspecified: Secondary | ICD-10-CM | POA: Diagnosis not present

## 2016-01-24 DIAGNOSIS — E119 Type 2 diabetes mellitus without complications: Secondary | ICD-10-CM | POA: Diagnosis not present

## 2016-01-24 DIAGNOSIS — E785 Hyperlipidemia, unspecified: Secondary | ICD-10-CM | POA: Diagnosis not present

## 2016-01-24 DIAGNOSIS — E039 Hypothyroidism, unspecified: Secondary | ICD-10-CM | POA: Diagnosis not present

## 2016-01-24 DIAGNOSIS — I5022 Chronic systolic (congestive) heart failure: Secondary | ICD-10-CM | POA: Diagnosis not present

## 2016-02-05 DIAGNOSIS — Z6841 Body Mass Index (BMI) 40.0 and over, adult: Secondary | ICD-10-CM | POA: Diagnosis not present

## 2016-02-13 DIAGNOSIS — Z6841 Body Mass Index (BMI) 40.0 and over, adult: Secondary | ICD-10-CM | POA: Diagnosis not present

## 2016-02-21 DIAGNOSIS — Z6841 Body Mass Index (BMI) 40.0 and over, adult: Secondary | ICD-10-CM | POA: Diagnosis not present

## 2016-02-24 ENCOUNTER — Emergency Department (HOSPITAL_COMMUNITY): Payer: Medicare Other

## 2016-02-24 ENCOUNTER — Emergency Department (HOSPITAL_COMMUNITY)
Admission: EM | Admit: 2016-02-24 | Discharge: 2016-02-24 | Disposition: A | Discharge status: Home / Self Care | Payer: Medicare Other | Attending: Emergency Medicine | Admitting: Emergency Medicine

## 2016-02-24 ENCOUNTER — Encounter (HOSPITAL_COMMUNITY): Payer: Self-pay | Admitting: *Deleted

## 2016-02-24 DIAGNOSIS — Y999 Unspecified external cause status: Secondary | ICD-10-CM | POA: Insufficient documentation

## 2016-02-24 DIAGNOSIS — F329 Major depressive disorder, single episode, unspecified: Secondary | ICD-10-CM | POA: Diagnosis not present

## 2016-02-24 DIAGNOSIS — T148XXA Other injury of unspecified body region, initial encounter: Secondary | ICD-10-CM

## 2016-02-24 DIAGNOSIS — I509 Heart failure, unspecified: Secondary | ICD-10-CM | POA: Diagnosis not present

## 2016-02-24 DIAGNOSIS — X58XXXA Exposure to other specified factors, initial encounter: Secondary | ICD-10-CM | POA: Diagnosis not present

## 2016-02-24 DIAGNOSIS — Y939 Activity, unspecified: Secondary | ICD-10-CM | POA: Insufficient documentation

## 2016-02-24 DIAGNOSIS — M79662 Pain in left lower leg: Secondary | ICD-10-CM | POA: Diagnosis not present

## 2016-02-24 DIAGNOSIS — S8992XA Unspecified injury of left lower leg, initial encounter: Secondary | ICD-10-CM | POA: Diagnosis present

## 2016-02-24 DIAGNOSIS — J45909 Unspecified asthma, uncomplicated: Secondary | ICD-10-CM | POA: Diagnosis not present

## 2016-02-24 DIAGNOSIS — J449 Chronic obstructive pulmonary disease, unspecified: Secondary | ICD-10-CM | POA: Insufficient documentation

## 2016-02-24 DIAGNOSIS — S8012XA Contusion of left lower leg, initial encounter: Secondary | ICD-10-CM | POA: Diagnosis not present

## 2016-02-24 DIAGNOSIS — Y92513 Shop (commercial) as the place of occurrence of the external cause: Secondary | ICD-10-CM | POA: Insufficient documentation

## 2016-02-24 DIAGNOSIS — I110 Hypertensive heart disease with heart failure: Secondary | ICD-10-CM | POA: Insufficient documentation

## 2016-02-24 DIAGNOSIS — E119 Type 2 diabetes mellitus without complications: Secondary | ICD-10-CM | POA: Diagnosis not present

## 2016-02-24 MED ORDER — TRAMADOL HCL 50 MG PO TABS
50.0000 mg | ORAL_TABLET | Freq: Once | ORAL | Status: AC
  Administered 2016-02-24: 50 mg via ORAL
  Filled 2016-02-24: qty 1

## 2016-02-24 MED ORDER — IBUPROFEN 600 MG PO TABS: 600.0000 mg | ORAL_TABLET | Freq: Four times a day (QID) | ORAL | Status: DC | PRN

## 2016-02-24 MED ORDER — TRAMADOL HCL 50 MG PO TABS: 50.0000 mg | ORAL_TABLET | Freq: Four times a day (QID) | ORAL | Status: DC | PRN
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
## 2016-02-28 DIAGNOSIS — Z6841 Body Mass Index (BMI) 40.0 and over, adult: Secondary | ICD-10-CM | POA: Diagnosis not present

## 2016-03-13 DIAGNOSIS — Z6841 Body Mass Index (BMI) 40.0 and over, adult: Secondary | ICD-10-CM | POA: Diagnosis not present

## 2016-03-27 ENCOUNTER — Telehealth: Payer: Self-pay | Admitting: Cardiovascular Disease

## 2016-03-27 DIAGNOSIS — E039 Hypothyroidism, unspecified: Secondary | ICD-10-CM | POA: Diagnosis not present

## 2016-03-27 DIAGNOSIS — E119 Type 2 diabetes mellitus without complications: Secondary | ICD-10-CM | POA: Diagnosis not present

## 2016-03-27 DIAGNOSIS — Z6841 Body Mass Index (BMI) 40.0 and over, adult: Secondary | ICD-10-CM | POA: Diagnosis not present

## 2016-03-27 NOTE — Telephone Encounter (Signed)
Pt called wanting to know if a letter could be written saying that it is a good idea for the pt. To have the access skin flap removed since she has lost so much weight. She has a consultation next Wednesday with a surgeon that she hopes will be able to do the surgery. She was saying that she has lost an extremely large amount of weight and she has so much skin hanging that it is still a struggle to be as active as she would like to be. She was thinking if she got a letter from her doctors that she sees regularly that the surgeon will not be as hesitant to do the surgery. Please advise.

## 2016-03-27 NOTE — Telephone Encounter (Signed)
Wrote & sent letter. Called pt to let her know to look for it in the mail.

## 2016-03-27 NOTE — Telephone Encounter (Signed)
We can give her clearance from my perspective.

## 2016-03-27 NOTE — Telephone Encounter (Signed)
Pt would like someone to give her a call about an up coming surgery she is going to be having, she is wanting Dr. Raliegh Ip to write a letter stating it would be beneficial for her to have this surgery done.

## 2016-04-03 DIAGNOSIS — M793 Panniculitis, unspecified: Secondary | ICD-10-CM | POA: Diagnosis not present

## 2016-04-03 DIAGNOSIS — K429 Umbilical hernia without obstruction or gangrene: Secondary | ICD-10-CM | POA: Diagnosis not present

## 2016-04-10 DIAGNOSIS — E119 Type 2 diabetes mellitus without complications: Secondary | ICD-10-CM | POA: Diagnosis not present

## 2016-04-10 DIAGNOSIS — Z6841 Body Mass Index (BMI) 40.0 and over, adult: Secondary | ICD-10-CM | POA: Diagnosis not present

## 2016-04-16 DIAGNOSIS — E039 Hypothyroidism, unspecified: Secondary | ICD-10-CM | POA: Diagnosis not present

## 2016-04-16 DIAGNOSIS — E119 Type 2 diabetes mellitus without complications: Secondary | ICD-10-CM | POA: Diagnosis not present

## 2016-04-16 DIAGNOSIS — E559 Vitamin D deficiency, unspecified: Secondary | ICD-10-CM | POA: Diagnosis not present

## 2016-04-16 DIAGNOSIS — I5022 Chronic systolic (congestive) heart failure: Secondary | ICD-10-CM | POA: Diagnosis not present

## 2016-04-24 DIAGNOSIS — Z6841 Body Mass Index (BMI) 40.0 and over, adult: Secondary | ICD-10-CM | POA: Diagnosis not present

## 2016-04-24 DIAGNOSIS — E119 Type 2 diabetes mellitus without complications: Secondary | ICD-10-CM | POA: Diagnosis not present

## 2016-04-26 DIAGNOSIS — M793 Panniculitis, unspecified: Secondary | ICD-10-CM | POA: Diagnosis not present

## 2016-04-26 DIAGNOSIS — K429 Umbilical hernia without obstruction or gangrene: Secondary | ICD-10-CM | POA: Diagnosis not present

## 2016-04-30 ENCOUNTER — Other Ambulatory Visit (HOSPITAL_COMMUNITY): Payer: Self-pay | Admitting: Internal Medicine

## 2016-04-30 ENCOUNTER — Ambulatory Visit (HOSPITAL_COMMUNITY)
Admission: RE | Admit: 2016-04-30 | Discharge: 2016-04-30 | Disposition: A | Discharge status: Home / Self Care | Payer: Medicare Other | Source: Ambulatory Visit | Attending: Internal Medicine | Admitting: Internal Medicine

## 2016-04-30 DIAGNOSIS — R7989 Other specified abnormal findings of blood chemistry: Secondary | ICD-10-CM

## 2016-04-30 DIAGNOSIS — R071 Chest pain on breathing: Secondary | ICD-10-CM | POA: Insufficient documentation

## 2016-04-30 DIAGNOSIS — R0781 Pleurodynia: Secondary | ICD-10-CM

## 2016-04-30 DIAGNOSIS — R791 Abnormal coagulation profile: Secondary | ICD-10-CM | POA: Diagnosis not present

## 2016-04-30 DIAGNOSIS — E119 Type 2 diabetes mellitus without complications: Secondary | ICD-10-CM | POA: Diagnosis not present

## 2016-04-30 DIAGNOSIS — I5022 Chronic systolic (congestive) heart failure: Secondary | ICD-10-CM | POA: Diagnosis not present

## 2016-04-30 DIAGNOSIS — I509 Heart failure, unspecified: Secondary | ICD-10-CM

## 2016-04-30 DIAGNOSIS — R0602 Shortness of breath: Secondary | ICD-10-CM | POA: Diagnosis not present

## 2016-04-30 DIAGNOSIS — Z1231 Encounter for screening mammogram for malignant neoplasm of breast: Secondary | ICD-10-CM

## 2016-04-30 MED ORDER — IOPAMIDOL (ISOVUE-370) INJECTION 76%
100.0000 mL | Freq: Once | INTRAVENOUS | Status: AC | PRN
  Administered 2016-04-30: 100 mL via INTRAVENOUS

## 2016-05-01 ENCOUNTER — Ambulatory Visit (HOSPITAL_COMMUNITY): Payer: Medicare Other

## 2016-05-08 DIAGNOSIS — Z6841 Body Mass Index (BMI) 40.0 and over, adult: Secondary | ICD-10-CM | POA: Diagnosis not present

## 2016-05-08 DIAGNOSIS — I5022 Chronic systolic (congestive) heart failure: Secondary | ICD-10-CM | POA: Diagnosis not present

## 2016-05-08 DIAGNOSIS — E119 Type 2 diabetes mellitus without complications: Secondary | ICD-10-CM | POA: Diagnosis not present

## 2016-05-22 DIAGNOSIS — E119 Type 2 diabetes mellitus without complications: Secondary | ICD-10-CM | POA: Diagnosis not present

## 2016-05-22 DIAGNOSIS — Z6841 Body Mass Index (BMI) 40.0 and over, adult: Secondary | ICD-10-CM | POA: Diagnosis not present

## 2016-05-22 DIAGNOSIS — E039 Hypothyroidism, unspecified: Secondary | ICD-10-CM | POA: Diagnosis not present

## 2016-05-22 DIAGNOSIS — I5022 Chronic systolic (congestive) heart failure: Secondary | ICD-10-CM | POA: Diagnosis not present

## 2016-05-28 DIAGNOSIS — I5022 Chronic systolic (congestive) heart failure: Secondary | ICD-10-CM | POA: Diagnosis not present

## 2016-05-28 DIAGNOSIS — E039 Hypothyroidism, unspecified: Secondary | ICD-10-CM | POA: Diagnosis not present

## 2016-05-28 DIAGNOSIS — E785 Hyperlipidemia, unspecified: Secondary | ICD-10-CM | POA: Diagnosis not present

## 2016-05-28 DIAGNOSIS — E119 Type 2 diabetes mellitus without complications: Secondary | ICD-10-CM | POA: Diagnosis not present

## 2016-06-05 DIAGNOSIS — E119 Type 2 diabetes mellitus without complications: Secondary | ICD-10-CM | POA: Diagnosis not present

## 2016-06-05 DIAGNOSIS — Z6841 Body Mass Index (BMI) 40.0 and over, adult: Secondary | ICD-10-CM | POA: Diagnosis not present

## 2016-06-19 DIAGNOSIS — I5022 Chronic systolic (congestive) heart failure: Secondary | ICD-10-CM | POA: Diagnosis not present

## 2016-06-19 DIAGNOSIS — E119 Type 2 diabetes mellitus without complications: Secondary | ICD-10-CM | POA: Diagnosis not present

## 2016-06-19 DIAGNOSIS — Z6841 Body Mass Index (BMI) 40.0 and over, adult: Secondary | ICD-10-CM | POA: Diagnosis not present

## 2016-06-20 ENCOUNTER — Observation Stay (HOSPITAL_COMMUNITY)
Admission: EM | Admit: 2016-06-20 | Discharge: 2016-06-21 | Disposition: A | Discharge status: Home / Self Care | Payer: Medicare Other | Attending: Internal Medicine | Admitting: Internal Medicine

## 2016-06-20 ENCOUNTER — Encounter (HOSPITAL_COMMUNITY): Payer: Self-pay | Admitting: Cardiology

## 2016-06-20 ENCOUNTER — Emergency Department (HOSPITAL_COMMUNITY): Payer: Medicare Other

## 2016-06-20 ENCOUNTER — Observation Stay (HOSPITAL_COMMUNITY): Payer: Medicare Other

## 2016-06-20 DIAGNOSIS — I10 Essential (primary) hypertension: Secondary | ICD-10-CM | POA: Diagnosis present

## 2016-06-20 DIAGNOSIS — R0789 Other chest pain: Secondary | ICD-10-CM | POA: Diagnosis not present

## 2016-06-20 DIAGNOSIS — Z791 Long term (current) use of non-steroidal anti-inflammatories (NSAID): Secondary | ICD-10-CM | POA: Insufficient documentation

## 2016-06-20 DIAGNOSIS — H538 Other visual disturbances: Secondary | ICD-10-CM | POA: Diagnosis not present

## 2016-06-20 DIAGNOSIS — E119 Type 2 diabetes mellitus without complications: Secondary | ICD-10-CM | POA: Insufficient documentation

## 2016-06-20 DIAGNOSIS — Z8669 Personal history of other diseases of the nervous system and sense organs: Secondary | ICD-10-CM | POA: Diagnosis present

## 2016-06-20 DIAGNOSIS — I110 Hypertensive heart disease with heart failure: Secondary | ICD-10-CM | POA: Diagnosis not present

## 2016-06-20 DIAGNOSIS — R29810 Facial weakness: Principal | ICD-10-CM

## 2016-06-20 DIAGNOSIS — J449 Chronic obstructive pulmonary disease, unspecified: Secondary | ICD-10-CM | POA: Diagnosis not present

## 2016-06-20 DIAGNOSIS — I5032 Chronic diastolic (congestive) heart failure: Secondary | ICD-10-CM

## 2016-06-20 DIAGNOSIS — G4733 Obstructive sleep apnea (adult) (pediatric): Secondary | ICD-10-CM | POA: Diagnosis present

## 2016-06-20 DIAGNOSIS — I509 Heart failure, unspecified: Secondary | ICD-10-CM | POA: Diagnosis not present

## 2016-06-20 DIAGNOSIS — F329 Major depressive disorder, single episode, unspecified: Secondary | ICD-10-CM | POA: Insufficient documentation

## 2016-06-20 DIAGNOSIS — Z79899 Other long term (current) drug therapy: Secondary | ICD-10-CM | POA: Diagnosis not present

## 2016-06-20 DIAGNOSIS — E1169 Type 2 diabetes mellitus with other specified complication: Secondary | ICD-10-CM

## 2016-06-20 DIAGNOSIS — J45909 Unspecified asthma, uncomplicated: Secondary | ICD-10-CM | POA: Insufficient documentation

## 2016-06-20 DIAGNOSIS — G459 Transient cerebral ischemic attack, unspecified: Secondary | ICD-10-CM | POA: Diagnosis not present

## 2016-06-20 DIAGNOSIS — Z743 Need for continuous supervision: Secondary | ICD-10-CM | POA: Diagnosis not present

## 2016-06-20 DIAGNOSIS — R279 Unspecified lack of coordination: Secondary | ICD-10-CM | POA: Diagnosis not present

## 2016-06-20 LAB — CBC
HEMATOCRIT: 39.9 % (ref 36.0–46.0)
HEMOGLOBIN: 13.9 g/dL (ref 12.0–15.0)
MCH: 32 pg (ref 26.0–34.0)
MCHC: 34.8 g/dL (ref 30.0–36.0)
MCV: 91.9 fL (ref 78.0–100.0)
Platelets: 245 10*3/uL (ref 150–400)
RBC: 4.34 MIL/uL (ref 3.87–5.11)
RDW: 12.7 % (ref 11.5–15.5)
WBC: 8.7 10*3/uL (ref 4.0–10.5)

## 2016-06-20 LAB — COMPREHENSIVE METABOLIC PANEL
ALBUMIN: 3.9 g/dL (ref 3.5–5.0)
ALK PHOS: 91 U/L (ref 38–126)
ALT: 34 U/L (ref 14–54)
AST: 28 U/L (ref 15–41)
Anion gap: 6 (ref 5–15)
BILIRUBIN TOTAL: 1 mg/dL (ref 0.3–1.2)
BUN: 19 mg/dL (ref 6–20)
CO2: 26 mmol/L (ref 22–32)
CREATININE: 1.31 mg/dL — AB (ref 0.44–1.00)
Calcium: 8.4 mg/dL — ABNORMAL LOW (ref 8.9–10.3)
Chloride: 108 mmol/L (ref 101–111)
GFR calc Af Amer: 55 mL/min — ABNORMAL LOW (ref >60)
GFR calc non Af Amer: 48 mL/min — ABNORMAL LOW (ref >60)
GLUCOSE: 117 mg/dL — AB (ref 65–99)
POTASSIUM: 3.2 mmol/L — AB (ref 3.5–5.1)
Sodium: 140 mmol/L (ref 135–145)
TOTAL PROTEIN: 6.9 g/dL (ref 6.5–8.1)

## 2016-06-20 LAB — GLUCOSE, CAPILLARY
Glucose-Capillary: 92 mg/dL (ref 65–99)
Glucose-Capillary: 202 mg/dL — ABNORMAL HIGH (ref 65–99)

## 2016-06-20 LAB — RAPID URINE DRUG SCREEN, HOSP PERFORMED
Amphetamines: NOT DETECTED
BARBITURATES: NOT DETECTED
Benzodiazepines: NOT DETECTED
COCAINE: NOT DETECTED
Opiates: NOT DETECTED
TETRAHYDROCANNABINOL: NOT DETECTED

## 2016-06-20 LAB — CBG MONITORING, ED: GLUCOSE-CAPILLARY: 121 mg/dL — AB (ref 65–99)

## 2016-06-20 LAB — MRSA PCR SCREENING: MRSA by PCR: NEGATIVE

## 2016-06-20 MED ORDER — IBUPROFEN 400 MG PO TABS
400.0000 mg | ORAL_TABLET | Freq: Once | ORAL | Status: AC
  Administered 2016-06-20: 400 mg via ORAL
  Filled 2016-06-20: qty 1

## 2016-06-20 MED ORDER — POTASSIUM CHLORIDE CRYS ER 20 MEQ PO TBCR
20.0000 meq | EXTENDED_RELEASE_TABLET | Freq: Two times a day (BID) | ORAL | Status: DC
  Administered 2016-06-20 – 2016-06-21 (×2): 20 meq via ORAL
  Filled 2016-06-20 (×2): qty 1

## 2016-06-20 MED ORDER — STROKE: EARLY STAGES OF RECOVERY BOOK
Freq: Once | Status: AC
  Administered 2016-06-21: 13:00:00
  Filled 2016-06-20: qty 1

## 2016-06-20 MED ORDER — ALBUTEROL SULFATE (2.5 MG/3ML) 0.083% IN NEBU: 3.0000 mL | INHALATION_SOLUTION | Freq: Four times a day (QID) | RESPIRATORY_TRACT | Status: DC | PRN

## 2016-06-20 MED ORDER — MEGESTROL ACETATE 40 MG PO TABS
80.0000 mg | ORAL_TABLET | Freq: Two times a day (BID) | ORAL | Status: DC
  Administered 2016-06-21: 80 mg via ORAL
  Filled 2016-06-20 (×8): qty 2

## 2016-06-20 MED ORDER — GLIPIZIDE 5 MG PO TABS
5.0000 mg | ORAL_TABLET | Freq: Two times a day (BID) | ORAL | Status: DC
  Administered 2016-06-21 (×2): 5 mg via ORAL
  Filled 2016-06-20 (×4): qty 1

## 2016-06-20 MED ORDER — TORSEMIDE 20 MG PO TABS
20.0000 mg | ORAL_TABLET | Freq: Two times a day (BID) | ORAL | Status: DC
  Administered 2016-06-20 – 2016-06-21 (×3): 20 mg via ORAL
  Filled 2016-06-20 (×4): qty 1

## 2016-06-20 MED ORDER — LEVOTHYROXINE SODIUM 25 MCG PO TABS
137.0000 ug | ORAL_TABLET | Freq: Every day | ORAL | Status: DC
  Administered 2016-06-21: 137 ug via ORAL
  Filled 2016-06-20 (×2): qty 1

## 2016-06-20 MED ORDER — ASPIRIN EC 81 MG PO TBEC
81.0000 mg | DELAYED_RELEASE_TABLET | Freq: Every day | ORAL | Status: DC
  Administered 2016-06-20 – 2016-06-21 (×2): 81 mg via ORAL
  Filled 2016-06-20 (×2): qty 1

## 2016-06-20 MED ORDER — ATORVASTATIN CALCIUM 40 MG PO TABS
80.0000 mg | ORAL_TABLET | Freq: Every day | ORAL | Status: DC
  Administered 2016-06-20 – 2016-06-21 (×2): 80 mg via ORAL
  Filled 2016-06-20 (×2): qty 2

## 2016-06-20 MED ORDER — INSULIN ASPART 100 UNIT/ML ~~LOC~~ SOLN: 0.0000 [IU] | Freq: Every day | SUBCUTANEOUS | Status: DC

## 2016-06-20 MED ORDER — ACETAMINOPHEN 650 MG RE SUPP: 650.0000 mg | RECTAL | Status: DC | PRN

## 2016-06-20 MED ORDER — SENNOSIDES-DOCUSATE SODIUM 8.6-50 MG PO TABS
1.0000 | ORAL_TABLET | Freq: Every evening | ORAL | Status: DC | PRN
  Filled 2016-06-20: qty 1

## 2016-06-20 MED ORDER — ENOXAPARIN SODIUM 40 MG/0.4ML ~~LOC~~ SOLN
40.0000 mg | SUBCUTANEOUS | Status: DC
  Administered 2016-06-20: 40 mg via SUBCUTANEOUS
  Filled 2016-06-20: qty 0.4

## 2016-06-20 MED ORDER — ACETAMINOPHEN 500 MG PO TABS
1000.0000 mg | ORAL_TABLET | Freq: Once | ORAL | Status: AC
  Administered 2016-06-20: 1000 mg via ORAL
  Filled 2016-06-20: qty 2

## 2016-06-20 MED ORDER — INSULIN ASPART 100 UNIT/ML ~~LOC~~ SOLN
4.0000 [IU] | Freq: Three times a day (TID) | SUBCUTANEOUS | Status: DC
  Administered 2016-06-21 (×2): 4 [IU] via SUBCUTANEOUS

## 2016-06-20 MED ORDER — LORAZEPAM 1 MG PO TABS
1.0000 mg | ORAL_TABLET | Freq: Once | ORAL | Status: AC
  Administered 2016-06-20: 1 mg via ORAL
  Filled 2016-06-20: qty 1

## 2016-06-20 MED ORDER — METOPROLOL TARTRATE 50 MG PO TABS
50.0000 mg | ORAL_TABLET | Freq: Two times a day (BID) | ORAL | Status: DC
  Administered 2016-06-20 – 2016-06-21 (×2): 50 mg via ORAL
  Filled 2016-06-20 (×2): qty 1

## 2016-06-20 MED ORDER — INSULIN ASPART 100 UNIT/ML ~~LOC~~ SOLN
0.0000 [IU] | Freq: Three times a day (TID) | SUBCUTANEOUS | Status: DC
  Administered 2016-06-21: 2 [IU] via SUBCUTANEOUS

## 2016-06-20 MED ORDER — BUPROPION HCL ER (SR) 150 MG PO TB12
150.0000 mg | ORAL_TABLET | Freq: Two times a day (BID) | ORAL | Status: DC
  Administered 2016-06-21: 150 mg via ORAL
  Filled 2016-06-20 (×8): qty 1

## 2016-06-20 MED ORDER — ACETAMINOPHEN 325 MG PO TABS: 650.0000 mg | ORAL_TABLET | ORAL | Status: DC | PRN

## 2016-06-20 NOTE — ED Notes (Addendum)
MD at bedside. 

## 2016-06-20 NOTE — ED Notes (Signed)
Patient returned from MRI. Unable to complete scan.

## 2016-06-20 NOTE — ED Notes (Signed)
Patient with slight nasolabial fold flattening on left side. Denies sensory deficits. Speech clear, bilateral grips strong and equal, no pronator drift noted. Patient states that her left eye has been having spasms with c/o facial drooping intermittently on left side as well. Symptoms are not present during assessment. Patient current complaints are of a banding frontal headache and "just feeling bad".

## 2016-06-20 NOTE — ED Notes (Signed)
Patient in MRI 

## 2016-06-20 NOTE — H&P (Signed)
History and Physical    Erin Sexton [id number] DOB: 03-10-1971 DOA: 06/20/2016  Referring MD/NP/PA: Jola Schmidt, EDP PCP: Doree Albee, MD  Patient coming from: Home  Chief Complaint: Left facial droop  HPI: Erin Sexton is a 45 y.o. female with multiple medical comorbidities including morbid obesity, diabetes, hypertension, hyperlipidemia among other things who presents to the hospital today with the above complaints. She states she was out running errands with her mother when she noted left-sided facial droop that lasted for maybe about 5 minutes and then resolved. This again recurred about 30 minutes later. At this point her mother decided to bring her into the hospital for evaluation. By the time she arrived in the ED her symptoms have already resolved again. She denies any chest pain, shortness of breath, dizziness lightheadedness, headache. Admission was requested for further evaluation and management.  Past Medical/Surgical History: Past Medical History  Diagnosis Date  . COPD (chronic obstructive pulmonary disease) ([place])   . Depression   . Hypertension   . Asthma   . Anxiety   . Anemia   . Blood transfusion without reported diagnosis   . Diabetes mellitus without complication ([place])     a. Dx 02/2014.  [name] HOCM (hypertrophic obstructive cardiomyopathy) ([place])     a. Dx [id number].  . Vaginal bleeding   . SVT (supraventricular tachycardia) ([place])     a. Noted on tele during 02/2014 adm for symptomatic anemia.  [name] NSVT (nonsustained ventricular tachycardia) ([place])     a. Noted on tele during 02/2014 adm for symptomatic anemia.  . Morbid obesity ([place])   . Abnormal CT scan     a. 02/2014: evaluated for dyspnea and initially placed on Xarelto for PE because radiologist could not exclude small peripheral pulmonary emboli because of limited contrast. However, after admission for ++vaginal bleeding several days later, it was felt she did NOT have PE - underwent further testing  with neg VQ 03/06/14, neg LE duplex.  . CHF (congestive heart failure) ([place])   . History of echocardiogram 2015    normal EF, Grade II diastolic dysfunction    History reviewed. No pertinent past surgical history.  Social History:  reports that she has never smoked. She has never used smokeless tobacco. She reports that she does not drink alcohol or use illicit drugs.  Allergies: No Known Allergies  Family History:  Family History  Problem Relation Age of Onset  . CAD Neg Hx     No family history of EARLY CAD  . Diabetes Mother   . Arthritis Mother   . Other Mother     WPW  . Uterine cancer Maternal Grandmother     Prior to Admission medications   Medication Sig Start Date End Date Taking? Authorizing Provider  albuterol (PROVENTIL HFA;VENTOLIN HFA) 108 (90 BASE) MCG/ACT inhaler Inhale 2 puffs into the lungs every 6 (six) hours as needed for wheezing or shortness of breath.   Yes Historical Provider, MD  atorvastatin (LIPITOR) 80 MG tablet TK 1 T PO D 01/25/16  Yes Historical Provider, MD  buPROPion (WELLBUTRIN SR) 150 MG 12 hr tablet TAKE ONE TABLET BY MOUTH TWICE DAILY 06/01/15  Yes Historical Provider, MD  Cholecalciferol (VITAMIN D PO) Take 1 capsule by mouth daily.   Yes Historical Provider, MD  fluticasone (FLOVENT HFA) 110 MCG/ACT inhaler Inhale 2 puffs into the lungs 2 (two) times daily. Patient taking differently: Inhale 2 puffs into the lungs daily as needed (FOR SHORTNESS OF BREATH).  03/03/14  Yes Kathie Dike, MD  glipiZIDE (GLUCOTROL) 5 MG tablet Take 5 mg by mouth 2 (two) times daily before a meal.    Yes Historical Provider, MD  ibuprofen (ADVIL,MOTRIN) 600 MG tablet Take 1 tablet (600 mg total) by mouth every 6 (six) hours as needed. 02/24/16  Yes Evalee Jefferson, PA-C  levothyroxine (SYNTHROID, LEVOTHROID) 137 MCG tablet TK 1 T PO D 01/13/16  Yes Historical Provider, MD  megestrol (MEGACE) 40 MG tablet TAKE 2 TABLETS BY MOUTH TWICE DAILY 12/24/15  Yes Florian Buff, MD    metFORMIN (GLUCOPHAGE) 500 MG tablet Take 1 tablet (500 mg total) by mouth 2 (two) times daily with a meal. 05/27/14  Yes Daniel J Angiulli, PA-C  metoprolol (LOPRESSOR) 50 MG tablet Take 50 mg by mouth 2 (two) times daily.  11/16/14  Yes Historical Provider, MD  potassium chloride SA (K-DUR,KLOR-CON) 20 MEQ tablet Take 20 mEq by mouth 2 (two) times daily.    Yes Historical Provider, MD  torsemide (DEMADEX) 20 MG tablet Take 1 tablet (20 mg total) by mouth daily. Patient taking differently: Take 20 mg by mouth 2 (two) times daily.  05/01/15  Yes Herminio Commons, MD    Review of Systems:  Constitutional: Denies fever, chills, diaphoresis, appetite change and fatigue.  HEENT: Denies photophobia, eye pain, redness, hearing loss, ear pain, congestion, sore throat, rhinorrhea, sneezing, mouth sores, trouble swallowing, neck pain, neck stiffness and tinnitus.   Respiratory: Denies SOB, DOE, cough, chest tightness,  and wheezing.   Cardiovascular: Denies chest pain, palpitations and leg swelling.  Gastrointestinal: Denies nausea, vomiting, abdominal pain, diarrhea, constipation, blood in stool and abdominal distention.  Genitourinary: Denies dysuria, urgency, frequency, hematuria, flank pain and difficulty urinating.  Endocrine: Denies: hot or cold intolerance, sweats, changes in hair or nails, polyuria, polydipsia. Musculoskeletal: Denies myalgias, back pain, joint swelling, arthralgias and gait problem.  Skin: Denies pallor, rash and wound.  Neurological: Denies  seizures, syncope, weakness, light-headedness, numbness and headaches.  Hematological: Denies adenopathy. Easy bruising, personal or family bleeding history  Psychiatric/Behavioral: Denies suicidal ideation, mood changes, confusion, nervousness, sleep disturbance and agitation    Physical Exam: Filed Vitals:   06/20/16 1309 06/20/16 1510 06/20/16 1557  BP: 125/70 133/64 125/60  Pulse: 73 73 75  Temp: 98 F (36.7 C)  98.3 F (36.8  C)  TempSrc:   Oral  Resp: 18 17   Height: 5\' 9"  (1.753 m)  5\' 9"  (1.753 m)  Weight: 162.388 kg (358 lb)  163.386 kg (360 lb 3.2 oz)  SpO2: 97% 98% 100%     Constitutional: NAD, calm, comfortable, Morbidly obese Eyes: PERRL, lids and conjunctivae normal ENMT: Mucous membranes are moist. Posterior pharynx clear of any exudate or lesions.Normal dentition.  Neck: normal, supple, no masses, no thyromegaly Respiratory: clear to auscultation bilaterally, no wheezing, no crackles. Normal respiratory effort. No accessory muscle use.  Cardiovascular: Regular rate and rhythm, no murmurs / rubs / gallops. No extremity edema. 2+ pedal pulses. No carotid bruits.  Abdomen: no tenderness, no masses palpated. No hepatosplenomegaly. Bowel sounds positive.  Musculoskeletal: no clubbing / cyanosis. No joint deformity upper and lower extremities. Good ROM, no contractures. Normal muscle tone.  Skin: no rashes, lesions, ulcers. No induration Neurologic: CN 2-12 grossly intact. Sensation intact, DTR normal. Strength 5/5 in all 4.  Psychiatric: Normal judgment and insight. Alert and oriented x 3. Normal mood.    Labs on Admission: I have personally reviewed the following labs and imaging studies  CBC:  Recent Labs Lab 06/20/16 1343  WBC 8.7  HGB 13.9  HCT 39.9  MCV 91.9  PLT 99991111   Basic Metabolic Panel:  Recent Labs Lab 06/20/16 1343  NA 140  K 3.2*  CL 108  CO2 26  GLUCOSE 117*  BUN 19  CREATININE 1.31*  CALCIUM 8.4*   GFR: Estimated Creatinine Clearance: 88.1 mL/min (by C-G formula based on Cr of 1.31). Liver Function Tests:  Recent Labs Lab 06/20/16 1343  AST 28  ALT 34  ALKPHOS 91  BILITOT 1.0  PROT 6.9  ALBUMIN 3.9   No results for input(s): LIPASE, AMYLASE in the last 168 hours. No results for input(s): AMMONIA in the last 168 hours. Coagulation Profile: No results for input(s): INR, PROTIME in the last 168 hours. Cardiac Enzymes: No results for input(s): CKTOTAL,  CKMB, CKMBINDEX, TROPONINI in the last 168 hours. BNP (last 3 results) No results for input(s): PROBNP in the last 8760 hours. HbA1C: No results for input(s): HGBA1C in the last 72 hours. CBG:  Recent Labs Lab 06/20/16 1319 06/20/16 1639  GLUCAP 121* 92   Lipid Profile: No results for input(s): CHOL, HDL, LDLCALC, TRIG, CHOLHDL, LDLDIRECT in the last 72 hours. Thyroid Function Tests: No results for input(s): TSH, T4TOTAL, FREET4, T3FREE, THYROIDAB in the last 72 hours. Anemia Panel: No results for input(s): VITAMINB12, FOLATE, FERRITIN, TIBC, IRON, RETICCTPCT in the last 72 hours. Urine analysis:    Component Value Date/Time   COLORURINE YELLOW 07/10/2015 2030   APPEARANCEUR CLEAR 07/10/2015 2030   LABSPEC 1.025 07/10/2015 2030   PHURINE 5.5 07/10/2015 2030   [place] NEGATIVE 07/10/2015 2030   HGBUR MODERATE* 07/10/2015 2030   [place] NEGATIVE 07/10/2015 2030   [place] 07/10/2015 2030   PROTEINUR TRACE* 07/10/2015 2030   UROBILINOGEN 0.2 07/10/2015 2030   NITRITE NEGATIVE 07/10/2015 2030   LEUKOCYTESUR TRACE* 07/10/2015 2030   Sepsis Labs: @LABRCNTIP (procalcitonin:4,lacticidven:4) )No results found for this or any previous visit (from the past 240 hour(s)).   Radiological Exams on Admission: Ct Head Wo Contrast  06/20/2016  CLINICAL DATA:  Acute onset left facial droop, blurred vision and dizziness for several hours. EXAM: CT HEAD WITHOUT CONTRAST TECHNIQUE: Contiguous axial images were obtained from the base of the skull through the vertex without intravenous contrast. COMPARISON:  None. FINDINGS: No evidence of intracranial hemorrhage, brain edema, or other signs of acute infarction. No evidence of intracranial mass lesion or mass effect. No abnormal extraaxial fluid collections identified. Ventricles are normal in size. No skull abnormality identified. IMPRESSION: Negative noncontrast head CT. Electronically Signed   By: Earle Gell M.D.   On: 06/20/2016  14:05    EKG: Independently reviewed. No acute ischemic abnormalities  Assessment/Plan Principal Problem:   TIA (transient ischemic attack) Active Problems:   Facial droop   Morbid obesity (HCC)   Essential hypertension, benign   OSA (obstructive sleep apnea)   Diabetes mellitus type 2, noninsulin dependent (HCC)   Chronic diastolic CHF (congestive heart failure) (HCC)    TIA -Neurologic symptoms have already resolved. -CT head without acute abnormality. -Will need MRI of the brain, due to body size does not fit in [facility] and will need to be transported via [facility] to [facility] to have this done. -Start aspirin for secondary stroke prevention. Insulin-request 2-D echo, carotid Dopplers, lipid profile. -Continue statin  Morbid obesity -Noted  Hypertension  -fair control  Chronic diastolic CHF -Appears compensated  Type 2 diabetes -Check A1c, start moderate sliding scale. Hold metformin  while in the hospital.   DVT prophylaxis: Lovenox  Code Status: Full code  Family Communication: Patient only  Disposition Plan: To be determined  Consults called: None  Admission status: Observation    Time Spent: 75 minutes  Lelon Frohlich MD [facility] Pager [phone]  If 7PM-7AM, please contact night-coverage [email or web address] Password [facility]  06/20/2016, 5:52 PM

## 2016-06-20 NOTE — ED Provider Notes (Signed)
CSN: [id number]     Arrival date & time 06/20/16  1305 History  By signing my name below, I, Higinio Plan, attest that this documentation has been prepared under the direction and in the presence of Jola Schmidt, MD . Electronically Signed: Higinio Plan, Scribe. 06/20/2016. 1:43 PM.   Chief Complaint  Patient presents with  . Facial Droop   The history is provided by the patient. No language interpreter was used.   HPI Comments: Erin Sexton is a 46 y.o. female with PMHx of HTN, DM, and CHF, who presents to the Emergency Department complaining of sudden onset, blurry vision and left-sided facial droop that began PTA. She notes her mother was speaking to her when she told her that her left eye was "drooping down". She states it lasted for ~5 minutes. She notes she experienced the exact same symptoms within the next 30 minutes. She states she could see a "jagged aura" when she closed her eyes. She denies any speech difficulty during her episodes. Pt reports she is not currently experiencing symptoms now in the ED. Pt also states she occasionally experiences intermittent, sharp, pains in her bilateral temples that last for ~5 seconds. She notes the pain is as short as a typical "brain-freeze." Pt denies recent illness and weakness in bilateral arms; however, she states she experienced a "tingly" feeling in her BUE. Pt denies hx of stroke or MI; she states she was told of a possible blood clot in her lungs. She states this was when she was diagnosed with CHF. Pt reports no hx of smoking; but presents FMHx of triple bypass surgery in father and WPW syndrome in mother.   Past Medical History  Diagnosis Date  . COPD (chronic obstructive pulmonary disease) ([place])   . Depression   . Hypertension   . Asthma   . Anxiety   . Anemia   . Blood transfusion without reported diagnosis   . Diabetes mellitus without complication ([place])     a. Dx 02/2014.  [name] HOCM (hypertrophic obstructive cardiomyopathy) ([place])      a. Dx [id number].  . Vaginal bleeding   . SVT (supraventricular tachycardia) ([place])     a. Noted on tele during 02/2014 adm for symptomatic anemia.  [name] NSVT (nonsustained ventricular tachycardia) ([place])     a. Noted on tele during 02/2014 adm for symptomatic anemia.  . Morbid obesity ([place])   . Abnormal CT scan     a. 02/2014: evaluated for dyspnea and initially placed on Xarelto for PE because radiologist could not exclude small peripheral pulmonary emboli because of limited contrast. However, after admission for ++vaginal bleeding several days later, it was felt she did NOT have PE - underwent further testing with neg VQ 03/06/14, neg LE duplex.  . CHF (congestive heart failure) ([place])   . History of echocardiogram 2015    normal EF, Grade II diastolic dysfunction   No past surgical history on file. Family History  Problem Relation Age of Onset  . CAD Neg Hx     No family history of EARLY CAD  . Diabetes Mother   . Arthritis Mother   . Other Mother     WPW  . Uterine cancer Maternal Grandmother    Social History  Substance Use Topics  . Smoking status: Never Smoker   . Smokeless tobacco: Never Used  . Alcohol Use: No   OB History    Gravida Para Term Preterm AB TAB SAB Ectopic Multiple Living  0     Review of Systems 10 systems reviewed and all are negative for acute change except as noted in the HPI.  Allergies  Review of patient's allergies indicates no known allergies.  Home Medications   Prior to Admission medications   Medication Sig Start Date End Date Taking? Authorizing Provider  albuterol (PROVENTIL HFA;VENTOLIN HFA) 108 (90 BASE) MCG/ACT inhaler Inhale 2 puffs into the lungs every 6 (six) hours as needed for wheezing or shortness of breath.    Historical Provider, MD  atorvastatin (LIPITOR) 80 MG tablet TK 1 T PO D 01/25/16   Historical Provider, MD  buPROPion (WELLBUTRIN SR) 150 MG 12 hr tablet TAKE ONE TABLET BY MOUTH TWICE DAILY 06/01/15   Historical Provider,  MD  fluticasone (FLOVENT HFA) 110 MCG/ACT inhaler Inhale 2 puffs into the lungs 2 (two) times daily. Patient taking differently: Inhale 2 puffs into the lungs daily as needed (FOR SHORTNESS OF BREATH).  03/03/14   Kathie Dike, MD  glipiZIDE (GLUCOTROL) 5 MG tablet Take 5 mg by mouth 2 (two) times daily before a meal.     Historical Provider, MD  ibuprofen (ADVIL,MOTRIN) 600 MG tablet Take 1 tablet (600 mg total) by mouth every 6 (six) hours as needed. 02/24/16   Evalee Jefferson, PA-C  levothyroxine (SYNTHROID, LEVOTHROID) 137 MCG tablet TK 1 T PO D 01/13/16   Historical Provider, MD  megestrol (MEGACE) 40 MG tablet TAKE 2 TABLETS BY MOUTH TWICE DAILY 12/24/15   Florian Buff, MD  metFORMIN (GLUCOPHAGE) 500 MG tablet Take 1 tablet (500 mg total) by mouth 2 (two) times daily with a meal. 05/27/14   Lavon Paganini Angiulli, PA-C  metoprolol (LOPRESSOR) 50 MG tablet Take 50 mg by mouth 2 (two) times daily.  11/16/14   Historical Provider, MD  potassium chloride SA (K-DUR,KLOR-CON) 20 MEQ tablet Take 20 mEq by mouth daily.    Historical Provider, MD  silver sulfADIAZINE (SILVADENE) 1 % cream Use to area BID Patient not taking: Reported on 02/24/2016 11/17/14   Florian Buff, MD  torsemide (DEMADEX) 20 MG tablet Take 1 tablet (20 mg total) by mouth daily. Patient taking differently: Take 20 mg by mouth 2 (two) times daily.  05/01/15   Herminio Commons, MD  traMADol (ULTRAM) 50 MG tablet Take 1 tablet (50 mg total) by mouth every 6 (six) hours as needed. 02/24/16   Evalee Jefferson, PA-C  triamcinolone cream (KENALOG) 0.1 % Apply 1 application topically 2 (two) times daily. Apply to blistered area of lesion Patient not taking: Reported on 07/10/2015 07/05/15   Evalee Jefferson, PA-C   LMP 06/04/2014 Physical Exam  Constitutional: She is oriented to person, place, and time. She appears well-developed and well-nourished.  HENT:  Head: Normocephalic and atraumatic.  Eyes: Pupils are equal, round, and reactive to light.   Cardiovascular: Regular rhythm.   Murmur heard. Systolic ejection murmur  Pulmonary/Chest: Effort normal and breath sounds normal.  Abdominal: Soft.  Musculoskeletal: Normal range of motion.  Neurological: She is alert and oriented to person, place, and time.  5/5 strength in major muscle groups of  bilateral upper and lower extremities. Speech normal. No facial asymetry. Normal finger to nose bilaterally   Nursing note and vitals reviewed.  ED Course  Procedures  DIAGNOSTIC STUDIES:  Oxygen Saturation is 97% on RA, normal by my interpretation.    COORDINATION OF CARE:  1:43 PM Discussed treatment plan, which includes CT head with pt at bedside and pt agreed to plan.  Labs Review Labs Reviewed  COMPREHENSIVE METABOLIC PANEL - Abnormal; Notable for the following:    Potassium 3.2 (*)    Glucose, Bld 117 (*)    Creatinine, Ser 1.31 (*)    Calcium 8.4 (*)    GFR calc non Af Amer 48 (*)    GFR calc Af Amer 55 (*)    All other components within normal limits  CBG MONITORING, ED - Abnormal; Notable for the following:    Glucose-Capillary 121 (*)    All other components within normal limits  CBC  URINE RAPID DRUG SCREEN, HOSP PERFORMED    Imaging Review Ct Head Wo Contrast  06/20/2016  CLINICAL DATA:  Acute onset left facial droop, blurred vision and dizziness for several hours. EXAM: CT HEAD WITHOUT CONTRAST TECHNIQUE: Contiguous axial images were obtained from the base of the skull through the vertex without intravenous contrast. COMPARISON:  None. FINDINGS: No evidence of intracranial hemorrhage, brain edema, or other signs of acute infarction. No evidence of intracranial mass lesion or mass effect. No abnormal extraaxial fluid collections identified. Ventricles are normal in size. No skull abnormality identified. IMPRESSION: Negative noncontrast head CT. Electronically Signed   By: Earle Gell M.D.   On: 06/20/2016 14:05   I have personally reviewed and evaluated these images  and lab results as part of my medical decision-making.   EKG Interpretation   Date/Time:  Thursday June 20 2016 13:20:39 EDT Ventricular Rate:  74 PR Interval:    QRS Duration: 102 QT Interval:  401 QTC Calculation: 445 R Axis:   21 Text Interpretation:  Sinus rhythm Abnormal R-wave progression, early  transition Borderline repolarization abnormality nonspecific inferior T  wave inversions as compared to ecg in 2015 Confirmed by Albirta Rhinehart  MD, Lennette Bihari  ([id number]) on 06/20/2016 1:42:14 PM      MDM   Final diagnoses:  Facial droop    2:42 PM Patient continues to feel good at this time.  She continues to be without neurologic symptoms.  Her symptoms sound concerning for TIA given the intermittent left-sided facial and eye droop.  These were transient episodes the last approximately 5-7 minutes.  She's had complete resolution of her symptoms at this time.  Initial head CT is negative.  She'll benefit from 24-hour observation as well as echo, carotids, MRI.   I personally performed the services described in this documentation, which was scribed in my presence. The recorded information has been reviewed and is accurate.       Jola Schmidt, MD 06/20/16 [phone]

## 2016-06-20 NOTE — ED Notes (Signed)
3 episodes in the last hour with left eye drooping ,  Blurry vision and dizziness.  Denies any of these symptoms at present.  Neuro assessment WNL in triage.

## 2016-06-21 ENCOUNTER — Ambulatory Visit (HOSPITAL_COMMUNITY)
Admit: 2016-06-21 | Discharge: 2016-06-21 | Disposition: A | Discharge status: Home / Self Care | Payer: Medicare Other | Attending: Internal Medicine | Admitting: Internal Medicine

## 2016-06-21 ENCOUNTER — Observation Stay (HOSPITAL_BASED_OUTPATIENT_CLINIC_OR_DEPARTMENT_OTHER): Payer: Medicare Other

## 2016-06-21 ENCOUNTER — Observation Stay (HOSPITAL_COMMUNITY): Payer: Medicare Other

## 2016-06-21 DIAGNOSIS — G459 Transient cerebral ischemic attack, unspecified: Secondary | ICD-10-CM

## 2016-06-21 DIAGNOSIS — R29810 Facial weakness: Secondary | ICD-10-CM | POA: Diagnosis not present

## 2016-06-21 DIAGNOSIS — I6789 Other cerebrovascular disease: Secondary | ICD-10-CM | POA: Diagnosis not present

## 2016-06-21 DIAGNOSIS — I6523 Occlusion and stenosis of bilateral carotid arteries: Secondary | ICD-10-CM | POA: Diagnosis not present

## 2016-06-21 LAB — ECHOCARDIOGRAM COMPLETE
AV pk vel: 348 cm/s
AVG: 22 mmHg
AVPG: 48 mmHg
DOP CAL AO MEAN VELOCITY: 217 cm/s
E/e' ratio: 16.91
EWDT: 324 ms
FS: 32 % (ref 28–44)
Height: 69 in
IV/PV OW: 1.16
LA ID, A-P, ES: 39 mm
LA diam end sys: 39 mm
LA vol: 56.4 mL
LADIAMINDEX: 1.34 cm/m2
LAVOLA4C: 40.9 mL
LAVOLIN: 19.3 mL/m2
LV E/e' medial: 16.91
LV E/e'average: 16.91
LV TDI E'MEDIAL: 4.68
LV e' LATERAL: 6.09 cm/s
LVOT area: 3.46 cm2
LVOTD: 21 mm
Lateral S' vel: 14.8 cm/s
MV Dec: 324
MV Peak grad: 4 mmHg
MVPKAVEL: 106 m/s
MVPKEVEL: 103 m/s
PW: 12.2 mm — AB (ref 0.6–1.1)
RV sys press: 24 mmHg
Reg peak vel: 228 cm/s
TDI e' lateral: 6.09
TR max vel: 228 cm/s
VTI: 71.7 cm
Weight: 5763.2 oz

## 2016-06-21 LAB — LIPID PANEL
CHOL/HDL RATIO: 3.9 ratio
Cholesterol: 97 mg/dL (ref 0–200)
HDL: 25 mg/dL — AB (ref >40)
LDL CALC: 54 mg/dL (ref 0–99)
TRIGLYCERIDES: 91 mg/dL (ref <150)
VLDL: 18 mg/dL (ref 0–40)

## 2016-06-21 LAB — GLUCOSE, CAPILLARY
GLUCOSE-CAPILLARY: 105 mg/dL — AB (ref 65–99)
GLUCOSE-CAPILLARY: 130 mg/dL — AB (ref 65–99)
Glucose-Capillary: 74 mg/dL (ref 65–99)

## 2016-06-21 LAB — HEMOGLOBIN A1C
Hgb A1c MFr Bld: 5.8 % — ABNORMAL HIGH (ref 4.8–5.6)
Mean Plasma Glucose: 120 mg/dL

## 2016-06-21 MED ORDER — ENOXAPARIN SODIUM 80 MG/0.8ML ~~LOC~~ SOLN
80.0000 mg | SUBCUTANEOUS | Status: DC
  Administered 2016-06-21: 80 mg via SUBCUTANEOUS
  Filled 2016-06-21: qty 0.8

## 2016-06-21 MED ORDER — SODIUM CHLORIDE 0.9% FLUSH
3.0000 mL | Freq: Two times a day (BID) | INTRAVENOUS | Status: DC
  Administered 2016-06-21: 3 mL via INTRAVENOUS

## 2016-06-21 MED ORDER — ASPIRIN 81 MG PO TBEC: 81.0000 mg | DELAYED_RELEASE_TABLET | Freq: Every day | ORAL | Status: DC

## 2016-06-21 MED ORDER — BUPROPION HCL ER (SR) 150 MG PO TB12
ORAL_TABLET | ORAL | Status: AC
  Filled 2016-06-21: qty 1

## 2016-06-21 MED ORDER — SODIUM CHLORIDE 0.9% FLUSH
3.0000 mL | INTRAVENOUS | Status: DC | PRN
  Administered 2016-06-21: 3 mL via INTRAVENOUS
  Filled 2016-06-21: qty 3

## 2016-06-21 MED ORDER — PERFLUTREN LIPID MICROSPHERE
1.0000 mL | INTRAVENOUS | Status: AC | PRN
  Administered 2016-06-21 (×2): 2 mL via INTRAVENOUS

## 2016-06-21 MED ORDER — SODIUM CHLORIDE 0.9 % IV SOLN: 250.0000 mL | INTRAVENOUS | Status: DC | PRN

## 2016-06-21 MED ORDER — MEGESTROL ACETATE 40 MG PO TABS
ORAL_TABLET | ORAL | Status: AC
  Filled 2016-06-21: qty 2

## 2016-06-21 NOTE — Progress Notes (Signed)
*  PRELIMINARY RESULTS* Echocardiogram 2D Echocardiogram has been performed with [facility].  Erin Sexton 06/21/2016, 2:36 PM

## 2016-06-21 NOTE — Evaluation (Signed)
Occupational Therapy Evaluation Patient Details Name: Erin Sexton MRN: [id number] DOB: Aug 05, 1971 Today's Date: 06/21/2016    History of Present Illness Erin Sexton is a 45 y.o. female with multiple medical comorbidities including morbid obesity, diabetes, hypertension, hyperlipidemia among other things who presents to the hospital today with the above complaints. She states she was out running errands with her mother when she noted left-sided facial droop that lasted for maybe about 5 minutes and then resolved. This again recurred about 30 minutes later. At this point her mother decided to bring her into the hospital for evaluation. By the time she arrived in the ED her symptoms have already resolved again. She denies any chest pain, shortness of breath, dizziness lightheadedness, headache. Admission was requested for further evaluation and management. CT and MRI negative for acute abnormality.    Clinical Impression   Pt awake, alert, oriented x4 this am, agreeable to OT evaluation. Pt reports she has had no additional episodes or symptoms since arriving yesterday. Pt demonstrates BUE strength 4+/5, coordination and sensation intact. Pt is mod independent in ADL tasks, no further OT services as pt is at baseline functioning.     Follow Up Recommendations  No OT follow up    Equipment Recommendations  None recommended by OT       Precautions / Restrictions Precautions Precautions: None Restrictions Weight Bearing Restrictions: No      Mobility Bed Mobility Overal bed mobility: Modified Independent                Transfers Overall transfer level: Modified independent                         ADL Overall ADL's : Modified independent;At baseline                                             Vision Vision Assessment?: No apparent visual deficits Additional Comments: Pt reports during episode she had narrowed vision and a "zigzag-like  aura" in her peripheral vision          Pertinent Vitals/Pain Pain Assessment: No/denies pain     Hand Dominance Right   Extremity/Trunk Assessment Upper Extremity Assessment Upper Extremity Assessment: Overall WFL for tasks assessed   Lower Extremity Assessment Lower Extremity Assessment: Defer to PT evaluation       Communication Communication Communication: No difficulties   Cognition Arousal/Alertness: Awake/alert Behavior During Therapy: WFL for tasks assessed/performed Overall Cognitive Status: Within Functional Limits for tasks assessed                                Home Living Family/patient expects to be discharged to:: Private residence Living Arrangements: Parent (Mother) Available Help at Discharge: Family;Friend(s);Available PRN/intermittently               Bathroom Shower/Tub: Occupational psychologist: Standard     Home Equipment: Shower seat          Prior Functioning/Environment Level of Independence: Independent              End of Session    Activity Tolerance: Patient tolerated treatment well Patient left: in bed;with call bell/phone within reach   Time: [id number] OT Time Calculation (min): 15 min Charges:  OT General Charges $OT Visit:  1 Procedure OT Evaluation $OT Eval Low Complexity: 1 Procedure G-Codes: OT G-codes **NOT FOR INPATIENT CLASS** Functional Assessment Tool Used: clinical judgement Functional Limitation: Self care Self Care Current Status [id number]): At least 1 percent but less than 20 percent impaired, limited or restricted Self Care Goal Status [id number]): At least 1 percent but less than 20 percent impaired, limited or restricted Self Care Discharge Status [phone]): At least 1 percent but less than 20 percent impaired, limited or restricted  Guadelupe Sabin, OTR/L  [phone]  06/21/2016, 9:18 AM

## 2016-06-21 NOTE — Evaluation (Signed)
Physical Therapy Evaluation Patient Details Name: Erin Sexton MRN: [id number] DOB: Aug 07, 1971 Today's Date: 06/21/2016   History of Present Illness  45 yo F admitted 07/13/2017with L facial droop. MRI and Head CT both (-). PMH: morbid obesity, DM, COPD, depression, HTN, asthma, SVT, NSVT, CHF.  Clinical Impression  Pt received in bed, and was agreeable to PT evaluation.  Pt expressed that she is normally independent with all functional mobility.  She lives with her mother and assists her mother with cooking/cleaning.  Today during PT evaluation she ambulation 382ft independently with no LOB, and negotiated 10 steps at Mod (I).  Pt expressed that she is at baseline level of function.  No further skilled PT needs at this time.    Follow Up Recommendations No PT follow up    Equipment Recommendations  None recommended by PT    Recommendations for Other Services       Precautions / Restrictions Precautions Precautions: None Restrictions Weight Bearing Restrictions: No      Mobility  Bed Mobility Overal bed mobility: Modified Independent                Transfers Overall transfer level: Modified independent                  Ambulation/Gait Ambulation/Gait assistance: Independent Ambulation Distance (Feet): 300 Feet Assistive device: None Gait Pattern/deviations: WFL(Within Functional Limits)        Stairs Stairs: Yes Stairs assistance: Modified independent (Device/Increase time) Stair Management: One rail Right;Alternating pattern Number of Stairs: 10    Wheelchair Mobility    Modified Rankin (Stroke Patients Only)       Balance Overall balance assessment: No apparent balance deficits (not formally assessed)                                           Pertinent Vitals/Pain Pain Assessment: No/denies pain    Home Living Family/patient expects to be discharged to:: Private residence Living Arrangements: Parent (mother - pt  is the caregiver and does cooking/cleaning for her mother.  ) Available Help at Discharge: Family;Friend(s);Available PRN/intermittently (sister is down the road) Type of Home: House Home Access: Stairs to enter Entrance Stairs-Rails: Can reach both Entrance Stairs-Number of Steps: 3 up the front, and 8 in the back.  Home Layout: One level Home Equipment: Shower seat;Other (comment) (nebulizer) Additional Comments: Pt sees a Physiological scientist since June 2015 and has lost 52#.    Prior Function Level of Independence: Independent               Hand Dominance   Dominant Hand: Right    Extremity/Trunk Assessment   Upper Extremity Assessment: Overall WFL for tasks assessed           Lower Extremity Assessment: Overall WFL for tasks assessed         Communication   Communication: No difficulties  Cognition Arousal/Alertness: Awake/alert Behavior During Therapy: WFL for tasks assessed/performed Overall Cognitive Status: Within Functional Limits for tasks assessed                      General Comments      Exercises        Assessment/Plan    PT Assessment Patent does not need any further PT services  PT Diagnosis Generalized weakness   PT Problem List    PT Treatment Interventions  PT Goals (Current goals can be found in the Care Plan section) Acute Rehab PT Goals PT Goal Formulation: All assessment and education complete, DC therapy    Frequency     Barriers to discharge        Co-evaluation               End of Session Equipment Utilized During Treatment: Gait belt Activity Tolerance: Patient tolerated treatment well;No increased pain Patient left: in chair;with call bell/phone within reach      Functional Assessment Tool Used: [place] "6-clicks"  Functional Limitation: Mobility: Walking and moving around Mobility: Walking and Moving Around Current Status [phone]): 0 percent impaired, limited or  restricted Mobility: Walking and Moving Around Goal Status [phone]): 0 percent impaired, limited or restricted Mobility: Walking and Moving Around Discharge Status [phone]): 0 percent impaired, limited or restricted    Time: 1032-1050 PT Time Calculation (min) (ACUTE ONLY): 18 min   Charges:   PT Evaluation $PT Eval Low Complexity: 1 Procedure     PT G Codes:   PT G-Codes **NOT FOR INPATIENT CLASS** Functional Assessment Tool Used: [employer] "6-clicks"  Functional Limitation: Mobility: Walking and moving around Mobility: Walking and Moving Around Current Status [phone]): 0 percent impaired, limited or restricted Mobility: Walking and Moving Around Goal Status [phone]): 0 percent impaired, limited or restricted Mobility: Walking and Moving Around Discharge Status [phone]): 0 percent impaired, limited or restricted    Beth Glenden Rossell, PT, DPT X: [id number]

## 2016-06-21 NOTE — Care Management Note (Signed)
Case Management Note  Patient Details  Name: IFRAH GOLLY MRN: [id number] Date of Birth: 1971-04-30  Subjective/Objective:                  Pt admitted with TIA. Pt is from home, lives with family and is ind with ADL's. Pt has PCP, drives herself to appointments and has no difficulty affording medications. Pt has no DME or HH services prior to admission. Pt plans to return home with self care.   Action/Plan: No CM needs.   Expected Discharge Date:      06/21/2016            Expected Discharge Plan:  Home/Self Care  In-House Referral:  NA  Discharge planning Services  CM Consult  Post Acute Care Choice:  NA Choice offered to:  NA  DME Arranged:    DME Agency:     HH Arranged:    HH Agency:     Status of Service:  Completed, signed off  If discussed at [employer] of Stay Meetings, dates discussed:    Additional Comments:  Sherald Barge, RN 06/21/2016, 9:31 AM

## 2016-06-21 NOTE — Care Management Obs Status (Signed)
[employer] NOTIFICATION   Patient Details  Name: Erin Sexton MRN: [id number] Date of Birth: [birth year]   Medicare Observation Status Notification Given:  Yes    Sherald Barge, RN 06/21/2016, 9:30 AM

## 2016-06-21 NOTE — Discharge Summary (Signed)
Physician Discharge Summary  Erin Sexton [id number] DOB: 08-May-1971 DOA: 06/20/2016  PCP: Doree Albee, MD  Admit date: 06/20/2016 Discharge date: 06/21/2016  Time spent: 45 minutes  Recommendations for Outpatient Follow-up:  -Will be discharged home today. -Advised to follow up with PCP in 2 weeks.   Discharge Diagnoses:  Principal Problem:   TIA (transient ischemic attack) Active Problems:   Facial droop   Morbid obesity (HCC)   Essential hypertension, benign   OSA (obstructive sleep apnea)   Diabetes mellitus type 2, noninsulin dependent (HCC)   Chronic diastolic CHF (congestive heart failure) ([place])   Discharge Condition: Stable and improved  Filed Weights   06/20/16 1309 06/20/16 1557  Weight: 162.388 kg (358 lb) 163.386 kg (360 lb 3.2 oz)    History of present illness:  Erin Sexton is a 45 y.o. female with multiple medical comorbidities including morbid obesity, diabetes, hypertension, hyperlipidemia among other things who presents to the hospital today with the above complaints. She states she was out running errands with her mother when she noted left-sided facial droop that lasted for maybe about 5 minutes and then resolved. This again recurred about 30 minutes later. At this point her mother decided to bring her into the hospital for evaluation. By the time she arrived in the ED her symptoms have already resolved again. She denies any chest pain, shortness of breath, dizziness lightheadedness, headache. Admission was requested for further evaluation and management.  Hospital Course:   TIA -MRI Brain without abnormalities. -ECHO: Left ventricle: Turbulent flow through LVOT Some obstruction to  outflow Peak and mean gradients through the subvalvular apparatus  are 48 and 22 mm Hg respectively. Severe asymmetric LVH. The  cavity size was normal. Systolic function was vigorous. The  estimated ejection fraction was in the range of 65% to 70%.   Doppler parameters are consistent with abnormal left ventricular  relaxation (grade 1 diastolic dysfunction). -Carotid Dopplers: IMPRESSION: Less than 50% stenosis in the right and left internal carotid arteries. -ASA 81 mg daily. -Continue statin. -Has been advised on risk factor modification.  Morbid obesity -Noted  Hypertension  -fair control  Chronic diastolic CHF -Appears compensated  Type 2 diabetes -Fair control/  Procedures:  None   Consultations:  None  Discharge Instructions  Discharge Instructions    Diet - low sodium heart healthy    Complete by:  As directed      Increase activity slowly    Complete by:  As directed             Medication List    TAKE these medications        albuterol 108 (90 Base) MCG/ACT inhaler  Commonly known as:  PROVENTIL HFA;VENTOLIN HFA  Inhale 2 puffs into the lungs every 6 (six) hours as needed for wheezing or shortness of breath.     aspirin 81 MG EC tablet  Take 1 tablet (81 mg total) by mouth daily.     atorvastatin 80 MG tablet  Commonly known as:  LIPITOR  TK 1 T PO D     buPROPion 150 MG 12 hr tablet  Commonly known as:  WELLBUTRIN SR  TAKE ONE TABLET BY MOUTH TWICE DAILY     fluticasone 110 MCG/ACT inhaler  Commonly known as:  FLOVENT HFA  Inhale 2 puffs into the lungs 2 (two) times daily.     glipiZIDE 5 MG tablet  Commonly known as:  GLUCOTROL  Take 5 mg by mouth 2 (two)  times daily before a meal.     ibuprofen 600 MG tablet  Commonly known as:  ADVIL,MOTRIN  Take 1 tablet (600 mg total) by mouth every 6 (six) hours as needed.     levothyroxine 137 MCG tablet  Commonly known as:  SYNTHROID, LEVOTHROID  TK 1 T PO D     megestrol 40 MG tablet  Commonly known as:  MEGACE  TAKE 2 TABLETS BY MOUTH TWICE DAILY     metFORMIN 500 MG tablet  Commonly known as:  GLUCOPHAGE  Take 1 tablet (500 mg total) by mouth 2 (two) times daily with a meal.     metoprolol 50 MG tablet  Commonly known as:   LOPRESSOR  Take 50 mg by mouth 2 (two) times daily.     potassium chloride SA 20 MEQ tablet  Commonly known as:  K-DUR,KLOR-CON  Take 20 mEq by mouth 2 (two) times daily.     torsemide 20 MG tablet  Commonly known as:  DEMADEX  Take 1 tablet (20 mg total) by mouth daily.     VITAMIN D PO  Take 1 capsule by mouth daily.       No Known Allergies     Follow-up Information    Follow up with Doree Albee, MD. Schedule an appointment as soon as possible for a visit in 2 weeks.   Specialty:  Internal Medicine   Contact information:   [address] [place] [id number] [phone]        The results of significant diagnostics from this hospitalization (including imaging, microbiology, ancillary and laboratory) are listed below for reference.    Significant Diagnostic Studies: Ct Head Wo Contrast  06/20/2016  CLINICAL DATA:  Acute onset left facial droop, blurred vision and dizziness for several hours. EXAM: CT HEAD WITHOUT CONTRAST TECHNIQUE: Contiguous axial images were obtained from the base of the skull through the vertex without intravenous contrast. COMPARISON:  None. FINDINGS: No evidence of intracranial hemorrhage, brain edema, or other signs of acute infarction. No evidence of intracranial mass lesion or mass effect. No abnormal extraaxial fluid collections identified. Ventricles are normal in size. No skull abnormality identified. IMPRESSION: Negative noncontrast head CT. Electronically Signed   By: Earle Gell M.D.   On: 06/20/2016 14:05   Mr Brain Wo Contrast  06/21/2016  CLINICAL DATA:  Initial evaluation for transient left-sided facial droop. EXAM: MRI HEAD WITHOUT CONTRAST MRA HEAD WITHOUT CONTRAST TECHNIQUE: Multiplanar, multiecho pulse sequences of the brain and surrounding structures were obtained without intravenous contrast. Angiographic images of the head were obtained using MRA technique without contrast. COMPARISON:  Prior CT from 06/20/2016. FINDINGS: MRI  HEAD FINDINGS Cerebral volume within normal limits. Minimal patchy T2/FLAIR hyperintensity within the periventricular and deep white matter both cerebral hemispheres, nonspecific. No abnormal foci of restricted diffusion to suggest acute infarct. Gray-white matter differentiation maintained. Major intracranial vascular flow voids are preserved. No acute or chronic intracranial hemorrhage. No areas of chronic infarction. No mass lesion, midline shift, or mass effect. No hydrocephalus. No extra-axial fluid collection. Major dural sinuses are grossly patent. Craniocervical junction normal. Visualized upper cervical spine unremarkable. Pituitary gland within normal limits. No acute abnormality about the globes and orbits. Mild scattered mucosal thickening within the ethmoidal air cells and maxillary sinuses. Paranasal sinuses are otherwise clear. No mastoid effusion. Inner ear structures grossly normal. Bone marrow signal intensity within normal limits. No scalp soft tissue abnormality. MRA HEAD FINDINGS ANTERIOR CIRCULATION: Distal cervical segments of the internal carotid arteries  are patent with antegrade flow. Petrous, cavernous, and supraclinoid segments widely patent. A1 segments, anterior communicating artery, and anterior cerebral arteries well opacified. M1 segments patent without stenosis or occlusion. MCA bifurcations normal. Distal MCA branches well opacified and symmetric. POSTERIOR CIRCULATION: Left vertebral artery dominant and patent to the vertebrobasilar junction. Right vertebral artery diffusely hypoplastic and terminates in PICA. Posterior inferior cerebral arteries patent bilaterally. Basilar artery diminutive but patent to its distal aspect. Superior cerebellar arteries patent bilaterally. P1 segments hypoplastic bilaterally, with the PCA supplied primarily via the posterior communicating arteries. PCAs well opacified to their distal aspects. No aneurysm or vascular malformation. IMPRESSION: MRI  HEAD IMPRESSION: 1. No acute intracranial process. 2. Mild cerebral white matter disease, nonspecific, but most likely related to chronic microvascular ischemic changes. MRA HEAD IMPRESSION: Normal intracranial MRA. Electronically Signed   By: Jeannine Boga M.D.   On: 06/21/2016 03:29   US Carotid Bilateral  06/21/2016  CLINICAL DATA:  TIA EXAM: BILATERAL CAROTID DUPLEX ULTRASOUND TECHNIQUE: Pearline Cables scale imaging, color Doppler and duplex ultrasound were performed of bilateral carotid and vertebral arteries in the neck. COMPARISON:  None. FINDINGS: Criteria: Quantification of carotid stenosis is based on velocity parameters that correlate the residual internal carotid diameter with NASCET-based stenosis levels, using the diameter of the distal internal carotid lumen as the denominator for stenosis measurement. The following velocity measurements were obtained: RIGHT ICA:  95 cm/sec CCA:  [id number] cm/sec SYSTOLIC ICA/CCA RATIO:  0.8 DIASTOLIC ICA/CCA RATIO:  1.2 ECA:  142 cm/sec LEFT ICA:  69 cm/sec CCA:  [id number] cm/sec SYSTOLIC ICA/CCA RATIO:  0.5 DIASTOLIC ICA/CCA RATIO:  0.6 ECA:  119 cm/sec RIGHT CAROTID ARTERY: Mild irregular plaque in the bulb. Low resistance internal carotid Doppler pattern. RIGHT VERTEBRAL ARTERY:  Antegrade. LEFT CAROTID ARTERY: Little if any plaque in the bulb. Low resistance internal carotid Doppler pattern with some turbulence. Turbulence is likely related to tortuosity. LEFT VERTEBRAL ARTERY:  Antegrade. IMPRESSION: Less than 50% stenosis in the right and left internal carotid arteries. Electronically Signed   By: Marybelle Killings M.D.   On: 06/21/2016 10:57   Mr Jodene Nam Head/brain Wo Cm  06/21/2016  CLINICAL DATA:  Initial evaluation for transient left-sided facial droop. EXAM: MRI HEAD WITHOUT CONTRAST MRA HEAD WITHOUT CONTRAST TECHNIQUE: Multiplanar, multiecho pulse sequences of the brain and surrounding structures were obtained without intravenous contrast. Angiographic images of the head  were obtained using MRA technique without contrast. COMPARISON:  Prior CT from 06/20/2016. FINDINGS: MRI HEAD FINDINGS Cerebral volume within normal limits. Minimal patchy T2/FLAIR hyperintensity within the periventricular and deep white matter both cerebral hemispheres, nonspecific. No abnormal foci of restricted diffusion to suggest acute infarct. Gray-white matter differentiation maintained. Major intracranial vascular flow voids are preserved. No acute or chronic intracranial hemorrhage. No areas of chronic infarction. No mass lesion, midline shift, or mass effect. No hydrocephalus. No extra-axial fluid collection. Major dural sinuses are grossly patent. Craniocervical junction normal. Visualized upper cervical spine unremarkable. Pituitary gland within normal limits. No acute abnormality about the globes and orbits. Mild scattered mucosal thickening within the ethmoidal air cells and maxillary sinuses. Paranasal sinuses are otherwise clear. No mastoid effusion. Inner ear structures grossly normal. Bone marrow signal intensity within normal limits. No scalp soft tissue abnormality. MRA HEAD FINDINGS ANTERIOR CIRCULATION: Distal cervical segments of the internal carotid arteries are patent with antegrade flow. Petrous, cavernous, and supraclinoid segments widely patent. A1 segments, anterior communicating artery, and anterior cerebral arteries well opacified. M1 segments patent without stenosis or occlusion.  MCA bifurcations normal. Distal MCA branches well opacified and symmetric. POSTERIOR CIRCULATION: Left vertebral artery dominant and patent to the vertebrobasilar junction. Right vertebral artery diffusely hypoplastic and terminates in PICA. Posterior inferior cerebral arteries patent bilaterally. Basilar artery diminutive but patent to its distal aspect. Superior cerebellar arteries patent bilaterally. P1 segments hypoplastic bilaterally, with the PCA supplied primarily via the posterior communicating  arteries. PCAs well opacified to their distal aspects. No aneurysm or vascular malformation. IMPRESSION: MRI HEAD IMPRESSION: 1. No acute intracranial process. 2. Mild cerebral white matter disease, nonspecific, but most likely related to chronic microvascular ischemic changes. MRA HEAD IMPRESSION: Normal intracranial MRA. Electronically Signed   By: Jeannine Boga M.D.   On: 06/21/2016 03:29    Microbiology: Recent Results (from the past 240 hour(s))  MRSA PCR Screening     Status: None   Collection Time: 06/20/16  4:23 PM  Result Value Ref Range Status   MRSA by PCR NEGATIVE NEGATIVE Final    Comment:        The GeneXpert MRSA Assay (FDA approved for NASAL specimens only), is one component of a comprehensive MRSA colonization surveillance program. It is not intended to diagnose MRSA infection nor to guide or monitor treatment for MRSA infections.      Labs: Basic Metabolic Panel:  Recent Labs Lab 06/20/16 1343  NA 140  K 3.2*  CL 108  CO2 26  GLUCOSE 117*  BUN 19  CREATININE 1.31*  CALCIUM 8.4*   Liver Function Tests:  Recent Labs Lab 06/20/16 1343  AST 28  ALT 34  ALKPHOS 91  BILITOT 1.0  PROT 6.9  ALBUMIN 3.9   No results for input(s): LIPASE, AMYLASE in the last 168 hours. No results for input(s): AMMONIA in the last 168 hours. CBC:  Recent Labs Lab 06/20/16 1343  WBC 8.7  HGB 13.9  HCT 39.9  MCV 91.9  PLT 245   Cardiac Enzymes: No results for input(s): CKTOTAL, CKMB, CKMBINDEX, TROPONINI in the last 168 hours. BNP: BNP (last 3 results) No results for input(s): BNP in the last 8760 hours.  ProBNP (last 3 results) No results for input(s): PROBNP in the last 8760 hours.  CBG:  Recent Labs Lab 06/20/16 1639 06/20/16 2112 06/21/16 0807 06/21/16 1134 06/21/16 1646  GLUCAP 92 202* 105* 130* 74       Signed:  HERNANDEZ ACOSTA,ESTELA  [facility] Pager: [phone] 06/21/2016, 5:45 PM

## 2016-06-21 NOTE — Progress Notes (Signed)
Discharge instructions and prescription given, verbalized understanding, out in stable condition via w/c with staff. 

## 2016-06-22 LAB — HEMOGLOBIN A1C
Hgb A1c MFr Bld: 5.6 % (ref 4.8–5.6)
MEAN PLASMA GLUCOSE: 114 mg/dL

## 2016-06-28 DIAGNOSIS — E785 Hyperlipidemia, unspecified: Secondary | ICD-10-CM | POA: Diagnosis not present

## 2016-06-28 DIAGNOSIS — I5022 Chronic systolic (congestive) heart failure: Secondary | ICD-10-CM | POA: Diagnosis not present

## 2016-06-28 DIAGNOSIS — E039 Hypothyroidism, unspecified: Secondary | ICD-10-CM | POA: Diagnosis not present

## 2016-06-28 DIAGNOSIS — E119 Type 2 diabetes mellitus without complications: Secondary | ICD-10-CM | POA: Diagnosis not present

## 2016-07-01 ENCOUNTER — Ambulatory Visit (HOSPITAL_COMMUNITY): Payer: Medicare Other

## 2016-07-03 DIAGNOSIS — E119 Type 2 diabetes mellitus without complications: Secondary | ICD-10-CM | POA: Diagnosis not present

## 2016-07-03 DIAGNOSIS — Z6841 Body Mass Index (BMI) 40.0 and over, adult: Secondary | ICD-10-CM | POA: Diagnosis not present

## 2016-07-07 ENCOUNTER — Emergency Department (HOSPITAL_COMMUNITY)
Admission: EM | Admit: 2016-07-07 | Discharge: 2016-07-07 | Disposition: A | Discharge status: Home / Self Care | Payer: Medicare Other | Attending: Emergency Medicine | Admitting: Emergency Medicine

## 2016-07-07 ENCOUNTER — Encounter (HOSPITAL_COMMUNITY): Payer: Self-pay

## 2016-07-07 DIAGNOSIS — E119 Type 2 diabetes mellitus without complications: Secondary | ICD-10-CM | POA: Diagnosis not present

## 2016-07-07 DIAGNOSIS — Z8673 Personal history of transient ischemic attack (TIA), and cerebral infarction without residual deficits: Secondary | ICD-10-CM | POA: Insufficient documentation

## 2016-07-07 DIAGNOSIS — L02413 Cutaneous abscess of right upper limb: Secondary | ICD-10-CM | POA: Diagnosis present

## 2016-07-07 DIAGNOSIS — J449 Chronic obstructive pulmonary disease, unspecified: Secondary | ICD-10-CM | POA: Insufficient documentation

## 2016-07-07 DIAGNOSIS — I110 Hypertensive heart disease with heart failure: Secondary | ICD-10-CM | POA: Insufficient documentation

## 2016-07-07 DIAGNOSIS — L0291 Cutaneous abscess, unspecified: Secondary | ICD-10-CM

## 2016-07-07 DIAGNOSIS — I5032 Chronic diastolic (congestive) heart failure: Secondary | ICD-10-CM | POA: Diagnosis not present

## 2016-07-07 DIAGNOSIS — Z79899 Other long term (current) drug therapy: Secondary | ICD-10-CM | POA: Diagnosis not present

## 2016-07-07 DIAGNOSIS — Z7984 Long term (current) use of oral hypoglycemic drugs: Secondary | ICD-10-CM | POA: Insufficient documentation

## 2016-07-07 DIAGNOSIS — J45909 Unspecified asthma, uncomplicated: Secondary | ICD-10-CM | POA: Insufficient documentation

## 2016-07-07 DIAGNOSIS — Z7982 Long term (current) use of aspirin: Secondary | ICD-10-CM | POA: Insufficient documentation

## 2016-07-07 MED ORDER — MUPIROCIN 2 % EX OINT: TOPICAL_OINTMENT | CUTANEOUS | 0 refills | Status: DC

## 2016-07-07 MED ORDER — HYDROCODONE-ACETAMINOPHEN 5-325 MG PO TABS: ORAL_TABLET | ORAL | 0 refills | Status: DC

## 2016-07-07 MED ORDER — LIDOCAINE HCL (PF) 2 % IJ SOLN
10.0000 mL | Freq: Once | INTRAMUSCULAR | Status: AC
  Administered 2016-07-07: 10 mL via INTRADERMAL
  Filled 2016-07-07: qty 10

## 2016-07-07 MED ORDER — SULFAMETHOXAZOLE-TRIMETHOPRIM 800-160 MG PO TABS: 1.0000 | ORAL_TABLET | Freq: Two times a day (BID) | ORAL | 0 refills | Status: AC

## 2016-07-07 NOTE — Discharge Instructions (Signed)
Warm wet compresses or soaks 2-3 times a day.  Packing will need to be removed in 2 days.  Return here for any worsening symptoms

## 2016-07-07 NOTE — ED Triage Notes (Signed)
Abscess X1 week to right upper arm

## 2016-07-07 NOTE — ED Provider Notes (Signed)
[facility] DEPT Provider Note   CSN: [id number] Arrival date & time: 07/07/16  1928  First Provider Contact:   First MD Initiated Contact with Patient 07/07/16 1948     By signing my name below, I, Soijett Blue, attest that this documentation has been prepared under the direction and in the presence of Kem Parkinson, PA-C Electronically Signed: Soijett Blue, ED Scribe. 07/07/16. 8:06 PM.  History   Chief Complaint Chief Complaint  Patient presents with  . Abscess    HPI Erin Sexton is a 45 y.o. female with a medical hx of DM and HTN, who presents to the Emergency Department complaining of abscess to right inner upper arm onset 1 week. Pt reports that she was previously admitted to the hospital for a TIA 2 weeks ago. Pt states that she had small red areas appear to her torso following being discharged from the hospital. Pt denies pain anywhere else at this time. Pt is having associated symptoms of redness to the affected area. She notes that she has tried warm compresses/soaks without medications for the relief of her symptoms. She denies fever, chills, drainage, and any other symptoms.  Denies PMHx of MRSA.     The history is provided by the patient. No language interpreter was used.  Abscess  Location:  Shoulder/arm Shoulder/arm abscess location:  R upper arm (inner) Abscess quality: painful and redness   Abscess quality: not draining   Red streaking: no   Duration:  1 week Progression:  Unchanged Pain details:    Quality:  Unable to specify   Severity:  Moderate   Duration:  1 week   Timing:  Constant   Progression:  Unchanged Chronicity:  New Context: diabetes   Relieved by:  Nothing Worsened by:  Nothing Ineffective treatments:  Warm compresses and draining/squeezing Associated symptoms: no fever   Risk factors: no hx of MRSA     Past Medical History:  Diagnosis Date  . Abnormal CT scan    a. 02/2014: evaluated for dyspnea and initially placed on  Xarelto for PE because radiologist could not exclude small peripheral pulmonary emboli because of limited contrast. However, after admission for ++vaginal bleeding several days later, it was felt she did NOT have PE - underwent further testing with neg VQ 03/06/14, neg LE duplex.  . Anemia   . Anxiety   . Asthma   . Blood transfusion without reported diagnosis   . CHF (congestive heart failure) ([place])   . COPD (chronic obstructive pulmonary disease) ([place])   . Depression   . Diabetes mellitus without complication ([place])    a. Dx 02/2014.  [name] History of echocardiogram 2015   normal EF, Grade II diastolic dysfunction  . HOCM (hypertrophic obstructive cardiomyopathy) ([place])    a. Dx [id number].  [name] Hypertension   . Morbid obesity ([place])   . NSVT (nonsustained ventricular tachycardia) ([place])    a. Noted on tele during 02/2014 adm for symptomatic anemia.  [name] SVT (supraventricular tachycardia) ([place])    a. Noted on tele during 02/2014 adm for symptomatic anemia.  . Vaginal bleeding     Patient Active Problem List   Diagnosis Date Noted  . Facial droop 06/20/2016  . TIA (transient ischemic attack) 06/20/2016  . Chronic diastolic CHF (congestive heart failure) ([place]) 06/20/2016  . Complex endometrial hyperplasia without atypia 06/08/2014  . Menorrhagia 06/01/2014  . Physical deconditioning 05/25/2014  . Catheter-associated urinary tract infection ([place]) 05/20/2014  . Hypokalemia 05/20/2014  . Acute on chronic diastolic congestive  heart failure ([place]) 05/18/2014  . Diabetes mellitus type 2, noninsulin dependent ([place])   . OSA (obstructive sleep apnea) 03/21/2014  . Vaginal bleeding 03/06/2014  . Acute blood loss anemia 03/06/2014  . HOCM (hypertrophic obstructive cardiomyopathy) ([place]) 03/06/2014  . Morbid obesity ([place]) 03/02/2014  . Essential hypertension, benign 03/02/2014  . Asthma, chronic 03/02/2014    History reviewed. No pertinent surgical history.  OB History    Gravida Para Term Preterm AB  Living             0   SAB TAB Ectopic Multiple Live Births                   Home Medications    Prior to Admission medications   Medication Sig Start Date End Date Taking? Authorizing Provider  albuterol (PROVENTIL HFA;VENTOLIN HFA) 108 (90 BASE) MCG/ACT inhaler Inhale 2 puffs into the lungs every 6 (six) hours as needed for wheezing or shortness of breath.    Historical Provider, MD  aspirin EC 81 MG EC tablet Take 1 tablet (81 mg total) by mouth daily. 06/21/16   Erline Hau, MD  atorvastatin (LIPITOR) 80 MG tablet TK 1 T PO D 01/25/16   Historical Provider, MD  buPROPion (WELLBUTRIN SR) 150 MG 12 hr tablet TAKE ONE TABLET BY MOUTH TWICE DAILY 06/01/15   Historical Provider, MD  Cholecalciferol (VITAMIN D PO) Take 1 capsule by mouth daily.    Historical Provider, MD  fluticasone (FLOVENT HFA) 110 MCG/ACT inhaler Inhale 2 puffs into the lungs 2 (two) times daily. Patient taking differently: Inhale 2 puffs into the lungs daily as needed (FOR SHORTNESS OF BREATH).  03/03/14   Kathie Dike, MD  glipiZIDE (GLUCOTROL) 5 MG tablet Take 5 mg by mouth 2 (two) times daily before a meal.     Historical Provider, MD  ibuprofen (ADVIL,MOTRIN) 600 MG tablet Take 1 tablet (600 mg total) by mouth every 6 (six) hours as needed. 02/24/16   Evalee Jefferson, PA-C  levothyroxine (SYNTHROID, LEVOTHROID) 137 MCG tablet TK 1 T PO D 01/13/16   Historical Provider, MD  megestrol (MEGACE) 40 MG tablet TAKE 2 TABLETS BY MOUTH TWICE DAILY 12/24/15   Florian Buff, MD  metFORMIN (GLUCOPHAGE) 500 MG tablet Take 1 tablet (500 mg total) by mouth 2 (two) times daily with a meal. 05/27/14   Lavon Paganini Angiulli, PA-C  metoprolol (LOPRESSOR) 50 MG tablet Take 50 mg by mouth 2 (two) times daily.  11/16/14   Historical Provider, MD  potassium chloride SA (K-DUR,KLOR-CON) 20 MEQ tablet Take 20 mEq by mouth 2 (two) times daily.     Historical Provider, MD  torsemide (DEMADEX) 20 MG tablet Take 1 tablet (20 mg total) by mouth  daily. Patient taking differently: Take 20 mg by mouth 2 (two) times daily.  05/01/15   Herminio Commons, MD    Family History Family History  Problem Relation Age of Onset  . Diabetes Mother   . Arthritis Mother   . Other Mother     WPW  . Uterine cancer Maternal Grandmother   . CAD Neg Hx     No family history of EARLY CAD    Social History Social History  Substance Use Topics  . Smoking status: Never Smoker  . Smokeless tobacco: Never Used  . Alcohol use No     Allergies   Review of patient's allergies indicates no known allergies.   Review of Systems Review of Systems  Constitutional: Negative  for chills and fever.  Skin: Positive for color change (redness to affected area). Negative for wound.       Abscess to right upper inner arm without drainage     Physical Exam Updated Vital Signs BP 164/72 (BP Location: Left Arm)   Pulse 85   Temp 98.2 F (36.8 C)   Resp 18   Ht 5\' 8"  (1.727 m)   Wt (!) 357 lb (161.9 kg)   LMP 06/04/2014   SpO2 98%   BMI 54.28 kg/m   Physical Exam  Constitutional: She is oriented to person, place, and time. She appears well-developed and well-nourished. No distress.  HENT:  Head: Normocephalic and atraumatic.  Cardiovascular: Normal rate, regular rhythm and normal heart sounds.   No murmur heard. Pulmonary/Chest: Effort normal and breath sounds normal. No respiratory distress.  Neurological: She is alert and oriented to person, place, and time. She exhibits normal muscle tone. Coordination normal.  Skin: Skin is warm and dry. There is erythema.  Localized erythema and fluctuance of the right upper arm.  No drainage  Nursing note and vitals reviewed.    ED Treatments / Results  DIAGNOSTIC STUDIES: Oxygen Saturation is 98% on RA, nl by my interpretation.    COORDINATION OF CARE: 7:52 PM Discussed treatment plan with pt at bedside which includes I&D and pt agreed to plan.   Procedures Procedures (including critical  care time)  Medications Ordered in ED Medications  lidocaine (XYLOCAINE) 2 % injection 10 mL (not administered)   INCISION AND DRAINAGE Performed by: Hale Bogus. Consent: Verbal consent obtained. Risks and benefits: risks, benefits and alternatives were discussed Type: abscess  Body area: right upper arm  Anesthesia: local infiltration  Incision was made with a #11 scalpel.  Local anesthetic: lidocaine 2 % w/o epinephrine  Anesthetic total: 3 ml  Complexity: complex Blunt dissection to break up loculations  Drainage: purulent  Drainage amount: small  Packing material: 1/4 in iodoform gauze  Patient tolerance: Patient tolerated the procedure well with no immediate complications.     Initial Impression / Assessment and Plan / ED Course  I have reviewed the triage vital signs and the nursing notes.   Clinical Course   Pt well appearing.  Localized abscess.  Pt agrees to compresses, packing removal in 2 days and ER return if needed.    Final Clinical Impressions(s) / ED Diagnoses   Final diagnoses:  Abscess    New Prescriptions Discharge Medication List as of 07/07/2016  8:31 PM    START taking these medications   Details  HYDROcodone-acetaminophen (NORCO/VICODIN) 5-325 MG tablet Take one-two tabs po q 4-6 hrs prn pain, Print    mupirocin ointment (BACTROBAN) 2 % Apply to the affected areas TID x 10 days, Print    sulfamethoxazole-trimethoprim (BACTRIM DS,SEPTRA DS) 800-160 MG tablet Take 1 tablet by mouth 2 (two) times daily., Starting Sun 07/07/2016, Until Sun 07/14/2016, Print        I personally performed the services described in this documentation, which was scribed in my presence. The recorded information has been reviewed and is accurate.     Kem Parkinson, PA-C 07/09/16 [place], MD 07/11/16 2121

## 2016-07-08 ENCOUNTER — Ambulatory Visit: Payer: Medicare Other | Admitting: Obstetrics & Gynecology

## 2016-07-10 ENCOUNTER — Ambulatory Visit (HOSPITAL_COMMUNITY): Payer: Medicare Other

## 2016-07-15 DIAGNOSIS — E559 Vitamin D deficiency, unspecified: Secondary | ICD-10-CM | POA: Diagnosis not present

## 2016-07-15 DIAGNOSIS — E119 Type 2 diabetes mellitus without complications: Secondary | ICD-10-CM | POA: Diagnosis not present

## 2016-07-15 DIAGNOSIS — E785 Hyperlipidemia, unspecified: Secondary | ICD-10-CM | POA: Diagnosis not present

## 2016-07-15 DIAGNOSIS — E039 Hypothyroidism, unspecified: Secondary | ICD-10-CM | POA: Diagnosis not present

## 2016-07-16 ENCOUNTER — Ambulatory Visit (INDEPENDENT_AMBULATORY_CARE_PROVIDER_SITE_OTHER): Payer: Medicare Other | Admitting: Obstetrics & Gynecology

## 2016-07-16 ENCOUNTER — Encounter: Payer: Self-pay | Admitting: Obstetrics & Gynecology

## 2016-07-16 VITALS — BP 136/78 | HR 76 | Ht 69.0 in | Wt 355.0 lb

## 2016-07-16 DIAGNOSIS — N8501 Benign endometrial hyperplasia: Secondary | ICD-10-CM

## 2016-07-16 NOTE — Progress Notes (Signed)
      Chief Complaint  Patient presents with  . Follow-up    wants to discuss alternatives to the Megace    Blood pressure 136/78, pulse 76, height 5\' 9"  (1.753 m), weight (!) 355 lb (161 kg), last menstrual period 06/04/2014.  45 y.o. G0P0 Patient's last menstrual period was 06/04/2014. The current method of family planning is progesterone only.  Subjective Patient is known to me from a call at [facility] when [facility] and she get 6 units of packed red blood cells because of menometrorrhagia Subsequently performed an endometrial biopsy biopsy which revealed complex endometrial hyperplasia without atypia I placed her on Megace when she left the hospital previously and it kept her on that regimen the past 3 years she takes 40 mg in the morning and and 40 in the evening and that has for the most part completely controlled her bleeding She has some occasional bleeding but nothing significant certainly has required any transfusions  She has multiple medical problems WHICH are improving and seems at the present She has had 2 small TIAs recently and her physician has renewed since getting her off the megestrol However previously she had a DVT and questionable PE which she says she thinks she ruled out for  She is interested now in doing an endometrial ablation for definitive surgical management  I told Mordecai Maes that we would need to keep her on an IUD with Mirena because of her possibility of stimulating the endometrium with peripheral fat estrone in the coming years and she understands the need for that as well    Objective   Pertinent ROS No burning with urination, frequency or urgency No nausea, vomiting or diarrhea Nor fever chills or other constitutional symptoms   Labs or studies     Impression Diagnoses this Encounter::   ICD-9-CM ICD-10-CM   1. Endometrial hyperplasia without atypia, complex 621.32 N85.01 US Pelvis Complete     US Transvaginal Non-OB      Established relevant diagnosis(es): Morbid obesity and diabetes  Plan/Recommendations: No orders of the defined types were placed in this encounter.   Labs or Scans Ordered: Orders Placed This Encounter  Procedures  . US Pelvis Complete  . US Transvaginal Non-OB    Management:: Sonogram and then evaluate for possibility of hysteroscopy D&C endometrial ablation with subsequent placement of Mirena  Follow up Return in about 1 week (around 07/23/2016) for GYN sono, Follow up, with Dr Elonda Husky.        Face to face time:  15 minutes  Greater than 50% of the visit time was spent in counseling and coordination of care with the patient.  The summary and outline of the counseling and care coordination is summarized in the note above.   All questions were answered.

## 2016-07-17 ENCOUNTER — Ambulatory Visit (HOSPITAL_COMMUNITY)
Admission: RE | Admit: 2016-07-17 | Discharge: 2016-07-17 | Disposition: A | Discharge status: Home / Self Care | Payer: Medicare Other | Source: Ambulatory Visit | Attending: Internal Medicine | Admitting: Internal Medicine

## 2016-07-17 DIAGNOSIS — Z1231 Encounter for screening mammogram for malignant neoplasm of breast: Secondary | ICD-10-CM | POA: Diagnosis not present

## 2016-07-18 ENCOUNTER — Ambulatory Visit (INDEPENDENT_AMBULATORY_CARE_PROVIDER_SITE_OTHER): Payer: Medicare Other | Admitting: Cardiovascular Disease

## 2016-07-18 ENCOUNTER — Encounter: Payer: Self-pay | Admitting: Cardiovascular Disease

## 2016-07-18 VITALS — BP 120/88 | HR 81 | Ht 69.0 in | Wt 359.0 lb

## 2016-07-18 DIAGNOSIS — I5032 Chronic diastolic (congestive) heart failure: Secondary | ICD-10-CM

## 2016-07-18 DIAGNOSIS — I10 Essential (primary) hypertension: Secondary | ICD-10-CM

## 2016-07-18 DIAGNOSIS — E785 Hyperlipidemia, unspecified: Secondary | ICD-10-CM | POA: Diagnosis not present

## 2016-07-18 DIAGNOSIS — I421 Obstructive hypertrophic cardiomyopathy: Secondary | ICD-10-CM | POA: Diagnosis not present

## 2016-07-18 NOTE — Progress Notes (Signed)
SUBJECTIVE: Presents for routine fu. TIA in July.  She has a history of hypertropic cardiomyopathy, morbid obesity, hypertension, asthma, sleep apnea, hyperlipidemia, anemia, and chronic diastolic heart failure with grade 1 diastolic dysfunction.  Echo 06/21/16: Normal LV fxn, EF 65-7-%, mean gradient 22 mmHg, peak 48 mmHg, severe asymmetric LVH, DD 1.  Doing well. Denies chest pain. Drinks 1.5 gallons of water daily. No leg swelling. Follows with Dr. Anastasio Champion. Has dropped 2 sizes in past year.     Review of Systems: As per "subjective", otherwise negative.  No Known Allergies  Current Outpatient Prescriptions  Medication Sig Dispense Refill  . albuterol (PROVENTIL HFA;VENTOLIN HFA) 108 (90 BASE) MCG/ACT inhaler Inhale 2 puffs into the lungs every 6 (six) hours as needed for wheezing or shortness of breath.    [name] aspirin EC 81 MG EC tablet Take 1 tablet (81 mg total) by mouth daily.    [name] atorvastatin (LIPITOR) 80 MG tablet TK 1 T PO D  3  . buPROPion (WELLBUTRIN SR) 150 MG 12 hr tablet TAKE ONE TABLET BY MOUTH TWICE DAILY  2  . Cholecalciferol (VITAMIN D PO) Take 1 capsule by mouth daily.    . fluticasone (FLOVENT HFA) 110 MCG/ACT inhaler Inhale 2 puffs into the lungs 2 (two) times daily. 1 Inhaler 12  . glipiZIDE (GLUCOTROL) 5 MG tablet Take 5 mg by mouth 2 (two) times daily before a meal.     . HYDROcodone-acetaminophen (NORCO/VICODIN) 5-325 MG tablet Take one-two tabs po q 4-6 hrs prn pain 12 tablet 0  . ibuprofen (ADVIL,MOTRIN) 600 MG tablet Take 1 tablet (600 mg total) by mouth every 6 (six) hours as needed. 30 tablet 0  . levothyroxine (SYNTHROID, LEVOTHROID) 150 MCG tablet Take 150 mcg by mouth daily before breakfast.     . megestrol (MEGACE) 40 MG tablet TAKE 2 TABLETS BY MOUTH TWICE DAILY 120 tablet 11  . metFORMIN (GLUCOPHAGE) 500 MG tablet Take 1 tablet (500 mg total) by mouth 2 (two) times daily with a meal. 60 tablet 0  . metoprolol (LOPRESSOR) 50 MG tablet Take 50 mg  by mouth 2 (two) times daily.     . mupirocin ointment (BACTROBAN) 2 % Apply to the affected areas TID x 10 days 22 g 0  . potassium chloride SA (K-DUR,KLOR-CON) 20 MEQ tablet Take 20 mEq by mouth 2 (two) times daily.     [name] torsemide (DEMADEX) 20 MG tablet Take 1 tablet (20 mg total) by mouth daily. (Patient taking differently: Take 20 mg by mouth 2 (two) times daily. ) 90 tablet 3   No current facility-administered medications for this visit.     Past Medical History:  Diagnosis Date  . Abnormal CT scan    a. 02/2014: evaluated for dyspnea and initially placed on Xarelto for PE because radiologist could not exclude small peripheral pulmonary emboli because of limited contrast. However, after admission for ++vaginal bleeding several days later, it was felt she did NOT have PE - underwent further testing with neg VQ 03/06/14, neg LE duplex.  . Anemia   . Anxiety   . Asthma   . Blood transfusion without reported diagnosis   . CHF (congestive heart failure) ([place])   . COPD (chronic obstructive pulmonary disease) ([place])   . Depression   . Diabetes mellitus without complication ([place])    a. Dx 02/2014.  [name] History of echocardiogram 2015   normal EF, Grade II diastolic dysfunction  . HOCM (hypertrophic obstructive cardiomyopathy) ([place])  a. Dx [id number].  [name] Hypertension   . Morbid obesity ([place])   . NSVT (nonsustained ventricular tachycardia) ([place])    a. Noted on tele during 02/2014 adm for symptomatic anemia.  [name] SVT (supraventricular tachycardia) ([place])    a. Noted on tele during 02/2014 adm for symptomatic anemia.  . Vaginal bleeding     No past surgical history on file.  Social History   Social History  . Marital status: Divorced    Spouse name: N/A  . Number of children: N/A  . Years of education: N/A   Occupational History  . Not on file.   Social History Main Topics  . Smoking status: Never Smoker  . Smokeless tobacco: Never Used  . Alcohol use No  . Drug use: No  . Sexual  activity: Not Currently    Birth control/ protection: Pill   Other Topics Concern  . Not on file   Social History Narrative  . No narrative on file     Vitals:   07/18/16 0823  BP: 120/88  Pulse: 81  SpO2: 98%  Weight: (!) 359 lb (162.8 kg)  Height: 5\' 9"  (1.753 m)    PHYSICAL EXAM General: NAD, morbidly obese. HEENT: Normal. Neck: Difficult to assess JVP, no thyromegaly. Lungs: Clear to auscultation bilaterally with normal respiratory effort. CV: Nondisplaced PMI. Regular rate and rhythm, normal S1/S2, no S3/S4, soft II/VI pansystolic murmur along left sternal border. No periankle edema.  Abdomen: Morbidly obese. Neurologic: Alert and oriented x 3.  Psych: Normal affect. Skin: Normal. Musculoskeletal: Normal range of motion, no gross deformities. Extremities: No clubbing or cyanosis.     ECG: Most recent ECG reviewed.      ASSESSMENT AND PLAN: 1. Hypertrophic cardiomyopathy: Symptomatically stable. Continue metoprolol 50 mg twice daily.  2. Chronic diastolic heart failure: Euvolemic. No changes to therapy.  3. Essential HTN: Well controlled on metoprolol. No changes.  4. Hyperlipidemia: Continue atorvastatin 80 mg daily.  Dispo: fu 1 year.  Kate Sable, M.D., F.A.C.C.

## 2016-07-18 NOTE — Patient Instructions (Signed)
Your physician wants you to follow-up in: 1 year You will receive a reminder letter in the mail two months in advance. If you don't receive a letter, please call our office to schedule the follow-up appointment.    Your physician recommends that you continue on your current medications as directed. Please refer to the Current Medication list given to you today.     Thank you for choosing [facility] !  

## 2016-07-24 ENCOUNTER — Ambulatory Visit (INDEPENDENT_AMBULATORY_CARE_PROVIDER_SITE_OTHER): Payer: Medicare Other | Admitting: Obstetrics & Gynecology

## 2016-07-24 ENCOUNTER — Ambulatory Visit (INDEPENDENT_AMBULATORY_CARE_PROVIDER_SITE_OTHER): Payer: Medicare Other

## 2016-07-24 ENCOUNTER — Encounter: Payer: Self-pay | Admitting: Obstetrics & Gynecology

## 2016-07-24 VITALS — BP 100/60 | HR 74 | Ht 69.0 in | Wt 359.0 lb

## 2016-07-24 DIAGNOSIS — R938 Abnormal findings on diagnostic imaging of other specified body structures: Secondary | ICD-10-CM

## 2016-07-24 DIAGNOSIS — N854 Malposition of uterus: Secondary | ICD-10-CM

## 2016-07-24 DIAGNOSIS — N8501 Benign endometrial hyperplasia: Secondary | ICD-10-CM | POA: Diagnosis not present

## 2016-07-24 DIAGNOSIS — N921 Excessive and frequent menstruation with irregular cycle: Secondary | ICD-10-CM

## 2016-07-24 DIAGNOSIS — R9389 Abnormal findings on diagnostic imaging of other specified body structures: Secondary | ICD-10-CM

## 2016-07-24 NOTE — Progress Notes (Signed)
Follow up appointment for results  Chief Complaint  Patient presents with  . Follow-up    ultrasound today    Blood pressure 100/60, pulse 74, height 5\' 9"  (1.753 m), weight (!) 359 lb (162.8 kg), last menstrual period 06/04/2014.  US Transvaginal Non-ob  Result Date: 07/24/2016 GYNECOLOGIC SONOGRAM Erin Sexton is a 45 y.o. G0P0  Unknown LMP,she is here for a pelvic sonogram for endometrial hyperplasia. Uterus                      11.5 x 7.4 x 8 cm, homogeneous anteverted uterus Endometrium          33.4 mm, irregular, complex, thickened endometrium Right ovary             2.6 x 1.9 x 2.1 cm, wnl Left ovary                2.2 x 1.5 x 2.1 cm, wnl limited view No free fluid,no pain during ultrasound Technician Comments: PELVIC US TA/TV: Homogeneous anteverted uterus wnl,irregular, complex, thickened endometrium,EEC 33.4 mm,normal ov's bilat,limited view of left ov,no free fluid,no pain during ultrasound [employer] 07/24/2016 2:22 PM Clinical Impression and recommendations: I have reviewed the sonogram results above, combined with the patient's current clinical course, below are my impressions and any appropriate recommendations for management based on the sonographic findings. Uterus is normal, a bit generous in size but no myomata Endometrium is thickened from chronic megestrol therapy Both ovaries are normal Axel Meas H 07/24/2016 2:29 PM   US Pelvis Complete  Result Date: 07/24/2016 GYNECOLOGIC SONOGRAM Erin Sexton is a 45 y.o. G0P0  Unknown LMP,she is here for a pelvic sonogram for endometrial hyperplasia. Uterus                      11.5 x 7.4 x 8 cm, homogeneous anteverted uterus Endometrium          33.4 mm, irregular, complex, thickened endometrium Right ovary             2.6 x 1.9 x 2.1 cm, wnl Left ovary                2.2 x 1.5 x 2.1 cm, wnl limited view No free fluid,no pain during ultrasound Technician Comments: PELVIC US TA/TV: Homogeneous anteverted uterus wnl,irregular,  complex, thickened endometrium,EEC 33.4 mm,normal ov's bilat,limited view of left ov,no free fluid,no pain during ultrasound [employer] 07/24/2016 2:22 PM Clinical Impression and recommendations: I have reviewed the sonogram results above, combined with the patient's current clinical course, below are my impressions and any appropriate recommendations for management based on the sonographic findings. Uterus is normal, a bit generous in size but no myomata Endometrium is thickened from chronic megestrol therapy Both ovaries are normal Naoki Migliaccio H 07/24/2016 2:29 PM      MEDS ordered this encounter: No orders of the defined types were placed in this encounter.   Orders for this encounter: No orders of the defined types were placed in this encounter.   Plan: Pt and her PCP desire pt to get off of her megestrol Will proceed with hystersocopy uterine curettage endometrial ablation with post op plcement of IUD for chronic on going management of her endometrial hyperplasia  Follow Up:  Post op visit 08/14/2016   All questions were answered.  Past Medical History:  Diagnosis Date  . Abnormal CT scan    a. 02/2014: evaluated for dyspnea and initially placed on  Xarelto for PE because radiologist could not exclude small peripheral pulmonary emboli because of limited contrast. However, after admission for ++vaginal bleeding several days later, it was felt she did NOT have PE - underwent further testing with neg VQ 03/06/14, neg LE duplex.  . Anemia   . Anxiety   . Asthma   . Blood transfusion without reported diagnosis   . CHF (congestive heart failure) ([place])   . COPD (chronic obstructive pulmonary disease) ([place])   . Depression   . Diabetes mellitus without complication ([place])    a. Dx 02/2014.  [name] History of echocardiogram 2015   normal EF, Grade II diastolic dysfunction  . HOCM (hypertrophic obstructive cardiomyopathy) ([place])    a. Dx [id number].  [name] Hypertension   . Morbid obesity ([place])   . NSVT  (nonsustained ventricular tachycardia) ([place])    a. Noted on tele during 02/2014 adm for symptomatic anemia.  [name] SVT (supraventricular tachycardia) ([place])    a. Noted on tele during 02/2014 adm for symptomatic anemia.  . Vaginal bleeding     History reviewed. No pertinent surgical history.  OB History    Gravida Para Term Preterm AB Living             0   SAB TAB Ectopic Multiple Live Births                  No Known Allergies  Social History   Social History  . Marital status: Divorced    Spouse name: N/A  . Number of children: N/A  . Years of education: N/A   Social History Main Topics  . Smoking status: Never Smoker  . Smokeless tobacco: Never Used  . Alcohol use No  . Drug use: No  . Sexual activity: Not Currently    Birth control/ protection: Pill   Other Topics Concern  . None   Social History Narrative  . None    Family History  Problem Relation Age of Onset  . Diabetes Mother   . Arthritis Mother   . Other Mother     WPW  . Uterine cancer Maternal Grandmother   . CAD Neg Hx     No family history of EARLY CAD   Preoperative History and Physical  Erin Sexton is a 45 y.o. G0P0 with Patient's last menstrual period was 06/04/2014. admitted for a hysteroscopy uterine curettage endometrial ablation for complex atypia without atypia.  Also severe history of menometrorrhagia requiting multiple transfusion episodes PCP wants pt off of her megestrol due to question of TIA activity Pt will need managmeent of her menometrorrhagia and also chronic management of her complex hyperplasia with a post op placement of an IUD  PMH:    Past Medical History:  Diagnosis Date  . Abnormal CT scan    a. 02/2014: evaluated for dyspnea and initially placed on Xarelto for PE because radiologist could not exclude small peripheral pulmonary emboli because of limited contrast. However, after admission for ++vaginal bleeding several days later, it was felt she did NOT have PE -  underwent further testing with neg VQ 03/06/14, neg LE duplex.  . Anemia   . Anxiety   . Asthma   . Blood transfusion without reported diagnosis   . CHF (congestive heart failure) ([place])   . COPD (chronic obstructive pulmonary disease) ([place])   . Depression   . Diabetes mellitus without complication ([place])    a. Dx 02/2014.  [name] History of echocardiogram 2015   normal EF, Grade  II diastolic dysfunction  . HOCM (hypertrophic obstructive cardiomyopathy) ([place])    a. Dx [id number].  [name] Hypertension   . Morbid obesity ([place])   . NSVT (nonsustained ventricular tachycardia) ([place])    a. Noted on tele during 02/2014 adm for symptomatic anemia.  [name] SVT (supraventricular tachycardia) ([place])    a. Noted on tele during 02/2014 adm for symptomatic anemia.  . Vaginal bleeding     PSH:    History reviewed. No pertinent surgical history.  POb/GynH:      OB History    Gravida Para Term Preterm AB Living             0   SAB TAB Ectopic Multiple Live Births                  SH:   Social History  Substance Use Topics  . Smoking status: Never Smoker  . Smokeless tobacco: Never Used  . Alcohol use No    FH:    Family History  Problem Relation Age of Onset  . Diabetes Mother   . Arthritis Mother   . Other Mother     WPW  . Uterine cancer Maternal Grandmother   . CAD Neg Hx     No family history of EARLY CAD     Allergies: No Known Allergies  Medications:       Current Outpatient Prescriptions:  .  aspirin EC 81 MG EC tablet, Take 1 tablet (81 mg total) by mouth daily., Disp: , Rfl:  .  atorvastatin (LIPITOR) 80 MG tablet, TK 1 T PO D, Disp: , Rfl: 3 .  buPROPion (WELLBUTRIN SR) 150 MG 12 hr tablet, TAKE ONE TABLET BY MOUTH TWICE DAILY, Disp: , Rfl: 2 .  Cholecalciferol (VITAMIN D PO), Take 1 capsule by mouth daily., Disp: , Rfl:  .  fluticasone (FLOVENT HFA) 110 MCG/ACT inhaler, Inhale 2 puffs into the lungs 2 (two) times daily., Disp: 1 Inhaler, Rfl: 12 .  glipiZIDE (GLUCOTROL) 5 MG tablet,  Take 5 mg by mouth 2 (two) times daily before a meal. , Disp: , Rfl:  .  ibuprofen (ADVIL,MOTRIN) 600 MG tablet, Take 1 tablet (600 mg total) by mouth every 6 (six) hours as needed., Disp: 30 tablet, Rfl: 0 .  levothyroxine (SYNTHROID, LEVOTHROID) 150 MCG tablet, Take 150 mcg by mouth daily before breakfast. , Disp: , Rfl:  .  megestrol (MEGACE) 40 MG tablet, TAKE 2 TABLETS BY MOUTH TWICE DAILY, Disp: 120 tablet, Rfl: 11 .  metFORMIN (GLUCOPHAGE) 500 MG tablet, Take 1 tablet (500 mg total) by mouth 2 (two) times daily with a meal., Disp: 60 tablet, Rfl: 0 .  metoprolol (LOPRESSOR) 50 MG tablet, Take 50 mg by mouth 2 (two) times daily. , Disp: , Rfl:  .  potassium chloride SA (K-DUR,KLOR-CON) 20 MEQ tablet, Take 20 mEq by mouth 2 (two) times daily. , Disp: , Rfl:  .  torsemide (DEMADEX) 20 MG tablet, Take 1 tablet (20 mg total) by mouth daily. (Patient taking differently: Take 20 mg by mouth 2 (two) times daily. ), Disp: 90 tablet, Rfl: 3  Review of Systems:   Review of Systems  Constitutional: Negative for fever, chills, weight loss, malaise/fatigue and diaphoresis.  HENT: Negative for hearing loss, ear pain, nosebleeds, congestion, sore throat, neck pain, tinnitus and ear discharge.   Eyes: Negative for blurred vision, double vision, photophobia, pain, discharge and redness.  Respiratory: Negative for cough, hemoptysis, sputum production, shortness of breath, wheezing and stridor.  Cardiovascular: Negative for chest pain, palpitations, orthopnea, claudication, leg swelling and PND.  Gastrointestinal: Positive for abdominal pain. Negative for heartburn, nausea, vomiting, diarrhea, constipation, blood in stool and melena.  Genitourinary: Negative for dysuria, urgency, frequency, hematuria and flank pain.  Musculoskeletal: Negative for myalgias, back pain, joint pain and falls.  Skin: Negative for itching and rash.  Neurological: Negative for dizziness, tingling, tremors, sensory change, speech  change, focal weakness, seizures, loss of consciousness, weakness and headaches.  Endo/Heme/Allergies: Negative for environmental allergies and polydipsia. Does not bruise/bleed easily.  Psychiatric/Behavioral: Negative for depression, suicidal ideas, hallucinations, memory loss and substance abuse. The patient is not nervous/anxious and does not have insomnia.      PHYSICAL EXAM:  Blood pressure 100/60, pulse 74, height 5\' 9"  (1.753 m), weight (!) 359 lb (162.8 kg), last menstrual period 06/04/2014.    Vitals reviewed. Constitutional: She is oriented to person, place, and time. She appears well-developed and well-nourished.  HENT:  Head: Normocephalic and atraumatic.  Right Ear: External ear normal.  Left Ear: External ear normal.  Nose: Nose normal.  Mouth/Throat: Oropharynx is clear and moist.  Eyes: Conjunctivae and EOM are normal. Pupils are equal, round, and reactive to light. Right eye exhibits no discharge. Left eye exhibits no discharge. No scleral icterus.  Neck: Normal range of motion. Neck supple. No tracheal deviation present. No thyromegaly present.  Cardiovascular: Normal rate, regular rhythm, normal heart sounds and intact distal pulses.  Exam reveals no gallop and no friction rub.   No murmur heard. Respiratory: Effort normal and breath sounds normal. No respiratory distress. She has no wheezes. She has no rales. She exhibits no tenderness.  GI: Soft. Bowel sounds are normal. She exhibits no distension and no mass. There is tenderness. There is no rebound and no guarding.  Genitourinary:       Vulva is normal without lesions Vagina is pink moist without discharge Cervix normal in appearance and pap is normal Uterus is normal size, contour, position, consistency, mobility, non-tender Adnexa is negative with normal sized ovaries by sonogram  Musculoskeletal: Normal range of motion. She exhibits no edema and no tenderness.  Neurological: She is alert and oriented to  person, place, and time. She has normal reflexes. She displays normal reflexes. No cranial nerve deficit. She exhibits normal muscle tone. Coordination normal.  Skin: Skin is warm and dry. No rash noted. No erythema. No pallor.  Psychiatric: She has a normal mood and affect. Her behavior is normal. Judgment and thought content normal.    Labs: No results found for this or any previous visit (from the past 336 hour(s)).  EKG: Orders placed or performed during the hospital encounter of 06/20/16  . EKG 12-Lead  . EKG 12-Lead  . EKG    Imaging Studies: US Transvaginal Non-ob  Result Date: 07/24/2016 GYNECOLOGIC SONOGRAM Erin Sexton is a 45 y.o. G0P0  Unknown LMP,she is here for a pelvic sonogram for endometrial hyperplasia. Uterus                      11.5 x 7.4 x 8 cm, homogeneous anteverted uterus Endometrium          33.4 mm, irregular, complex, thickened endometrium Right ovary             2.6 x 1.9 x 2.1 cm, wnl Left ovary                2.2 x 1.5 x 2.1 cm, wnl limited view No  free fluid,no pain during ultrasound Technician Comments: PELVIC US TA/TV: Homogeneous anteverted uterus wnl,irregular, complex, thickened endometrium,EEC 33.4 mm,normal ov's bilat,limited view of left ov,no free fluid,no pain during ultrasound [employer] 07/24/2016 2:22 PM Clinical Impression and recommendations: I have reviewed the sonogram results above, combined with the patient's current clinical course, below are my impressions and any appropriate recommendations for management based on the sonographic findings. Uterus is normal, a bit generous in size but no myomata Endometrium is thickened from chronic megestrol therapy Both ovaries are normal Samera Macy H 07/24/2016 2:29 PM   US Pelvis Complete  Result Date: 07/24/2016 GYNECOLOGIC SONOGRAM Erin Sexton is a 45 y.o. G0P0  Unknown LMP,she is here for a pelvic sonogram for endometrial hyperplasia. Uterus                      11.5 x 7.4 x 8 cm,  homogeneous anteverted uterus Endometrium          33.4 mm, irregular, complex, thickened endometrium Right ovary             2.6 x 1.9 x 2.1 cm, wnl Left ovary                2.2 x 1.5 x 2.1 cm, wnl limited view No free fluid,no pain during ultrasound Technician Comments: PELVIC US TA/TV: Homogeneous anteverted uterus wnl,irregular, complex, thickened endometrium,EEC 33.4 mm,normal ov's bilat,limited view of left ov,no free fluid,no pain during ultrasound [employer] 07/24/2016 2:22 PM Clinical Impression and recommendations: I have reviewed the sonogram results above, combined with the patient's current clinical course, below are my impressions and any appropriate recommendations for management based on the sonographic findings. Uterus is normal, a bit generous in size but no myomata Endometrium is thickened from chronic megestrol therapy Both ovaries are normal Laine Fonner H 07/24/2016 2:29 PM   Mm Screening Breast Tomo Bilateral  Result Date: 07/18/2016 CLINICAL DATA:  Screening. EXAM: 2D DIGITAL SCREENING BILATERAL MAMMOGRAM WITH CAD AND ADJUNCT TOMO COMPARISON:  Previous exam(s). ACR Breast Density Category a: The breast tissue is almost entirely fatty. FINDINGS: There are no findings suspicious for malignancy. Images were processed with CAD. IMPRESSION: No mammographic evidence of malignancy. A result letter of this screening mammogram will be mailed directly to the patient. RECOMMENDATION: Screening mammogram in one year. (Code:SM-B-01Y) BI-RADS CATEGORY  1: Negative. Electronically Signed   By: Claudie Revering M.D.   On: 07/18/2016 14:07      Assessment: Complex endometrial hyperplasia without atypia Menometrorrhagia(prior to megestrol) s/p multiple transfusions, most recent 2 years ago 6 units, I then placed on megestrol  Patient Active Problem List   Diagnosis Date Noted  . Facial droop 06/20/2016  . TIA (transient ischemic attack) 06/20/2016  . Chronic diastolic CHF (congestive heart  failure) ([place]) 06/20/2016  . Complex endometrial hyperplasia without atypia 06/08/2014  . Menorrhagia 06/01/2014  . Physical deconditioning 05/25/2014  . Catheter-associated urinary tract infection ([place]) 05/20/2014  . Hypokalemia 05/20/2014  . Acute on chronic diastolic congestive heart failure ([place]) 05/18/2014  . Diabetes mellitus type 2, noninsulin dependent ([place])   . OSA (obstructive sleep apnea) 03/21/2014  . Vaginal bleeding 03/06/2014  . Acute blood loss anemia 03/06/2014  . HOCM (hypertrophic obstructive cardiomyopathy) ([place]) 03/06/2014  . Morbid obesity ([place]) 03/02/2014  . Essential hypertension, benign 03/02/2014  . Asthma, chronic 03/02/2014    Plan: Hysteroscopy uterine curettage endometrial ablation  [place] Krist H 07/24/2016 2:33 PM

## 2016-07-24 NOTE — Progress Notes (Signed)
PELVIC US TA/TV: Homogeneous anteverted uterus wnl,irregular, complex, thickened endometrium,EEC 33.4 mm,normal ov's bilat,limited view of left ov,no free fluid,no pain during ultrasound

## 2016-07-29 DIAGNOSIS — Z6841 Body Mass Index (BMI) 40.0 and over, adult: Secondary | ICD-10-CM | POA: Diagnosis not present

## 2016-07-29 DIAGNOSIS — E119 Type 2 diabetes mellitus without complications: Secondary | ICD-10-CM | POA: Diagnosis not present

## 2016-07-31 NOTE — Patient Instructions (Signed)
Erin Sexton  07/31/2016     @PREFPERIOPPHARMACY @   Your procedure is scheduled on  08/07/2016   Report to [facility] at  900  A.M.  Call this number if you have problems the morning of surgery:  [phone]   Remember:  Do not eat food or drink liquids after midnight.  Take these medicines the morning of surgery with A SIP OF WATER  Wellbutrin, levothyroxine, metoprolol. Take your inhaler before you come and bring your rescue inhaler with you.   Do not wear jewelry, make-up or nail polish.  Do not wear lotions, powders, or perfumes, or deoderant.  Do not shave 48 hours prior to surgery.  Men may shave face and neck.  Do not bring valuables to the hospital.  [facility] is not responsible for any belongings or valuables.  Contacts, dentures or bridgework may not be worn into surgery.  Leave your suitcase in the car.  After surgery it may be brought to your room.  For patients admitted to the hospital, discharge time will be determined by your treatment team.  Patients discharged the day of surgery will not be allowed to drive home.   Name and phone number of your driver:   none Special instructions:  family  Please read over the following fact sheets that you were given. Anesthesia Post-op Instructions and Care and Recovery After Surgery       Endometrial Ablation Endometrial ablation removes the lining of the uterus (endometrium). It is usually a same-day, outpatient treatment. Ablation helps avoid major surgery, such as surgery to remove the cervix and uterus (hysterectomy). After endometrial ablation, you will have little or no menstrual bleeding and may not be able to have children. However, if you are premenopausal, you will need to use a reliable method of birth control following the procedure because of the small chance that pregnancy can occur. There are different reasons to have this procedure. These reasons include:  Heavy  periods.  Bleeding that is causing anemia.  Irregular bleeding.  Bleeding fibroids on the lining inside the uterus if they are smaller than 3 centimeters. This procedure may not be possible for you if:   You want to have children in the future.   You have severe cramps with your menstrual period.   You have precancerous or cancerous cells in your uterus.   You were recently pregnant.   You have gone through menopause.   You have had major surgery on your uterus, resulting in thinning of the uterine wall. Surgeries may include:  The removal of one or more uterine fibroids (myomectomy).  A cesarean section with a classic (vertical) incision on your uterus. Ask your health care provider what type of cesarean you had. Sometimes the scar on your skin is different than the scar on your uterus. Even if you have had surgery on your uterus, certain types of ablation may still be safe for you. Talk with your health care provider. LET [facility] CARE PROVIDER KNOW ABOUT:  Any allergies you have.  All medicines you are taking, including vitamins, herbs, eye drops, creams, and over-the-counter medicines.  Previous problems you or members of your family have had with the use of anesthetics.  Any blood disorders you have.  Previous surgeries you have had.  Medical conditions you have. RISKS AND COMPLICATIONS  Generally, this is a safe procedure. However, as with any  procedure, complications can occur. Possible complications include:  Perforation of the uterus.  Bleeding.  Infection of the uterus, bladder, or vagina.  Injury to surrounding organs.  An air bubble to the lung (air embolus).  Pregnancy following the procedure.  Failure of the procedure to help the problem, requiring hysterectomy.  Decreased ability to diagnose cancer in the lining of the uterus. BEFORE THE PROCEDURE  The lining of the uterus must be tested to make sure there is no pre-cancerous or cancer  cells present.  An ultrasound may be performed to look at the size of the uterus and to check for abnormalities.  Medicines may be given to thin the lining of the uterus. PROCEDURE  During the procedure, your health care provider will use a tool called a resectoscope to help see inside your uterus. There are different ways to remove the lining of your uterus.   Radiofrequency - This method uses a radiofrequency-alternating electric current to remove the lining of the uterus.  Cryotherapy - This method uses extreme cold to freeze the lining of the uterus.  Heated-Free Liquid - This method uses heated salt (saline) solution to remove the lining of the uterus.  Microwave - This method uses high-energy microwaves to heat up the lining of the uterus to remove it.  Thermal balloon - This method involves inserting a catheter with a balloon tip into the uterus. The balloon tip is filled with heated fluid to remove the lining of the uterus. AFTER THE PROCEDURE  After your procedure, do not have sexual intercourse or insert anything into your vagina until permitted by your health care provider. After the procedure, you may experience:  Cramps.  Vaginal discharge.  Frequent urination.   This information is not intended to replace advice given to you by your health care provider. Make sure you discuss any questions you have with your health care provider.   Document Released: 10/04/2004 Document Revised: 08/16/2015 Document Reviewed: 04/28/2013 Elsevier Interactive Patient Education [employer]. Hysteroscopy Hysteroscopy is a procedure used for looking inside the womb (uterus). It may be done for various reasons, including:  To evaluate abnormal bleeding, fibroid (benign, noncancerous) tumors, polyps, scar tissue (adhesions), and possibly cancer of the uterus.  To look for lumps (tumors) and other uterine growths.  To look for causes of why a woman cannot get pregnant (infertility),  causes of recurrent loss of pregnancy (miscarriages), or a lost intrauterine device (IUD).  To perform a sterilization by blocking the fallopian tubes from inside the uterus. In this procedure, a thin, flexible tube with a tiny light and camera on the end of it (hysteroscope) is used to look inside the uterus. A hysteroscopy should be done right after a menstrual period to be sure you are not pregnant. LET [facility] CARE PROVIDER KNOW ABOUT:   Any allergies you have.  All medicines you are taking, including vitamins, herbs, eye drops, creams, and over-the-counter medicines.  Previous problems you or members of your family have had with the use of anesthetics.  Any blood disorders you have.  Previous surgeries you have had.  Medical conditions you have. RISKS AND COMPLICATIONS  Generally, this is a safe procedure. However, as with any procedure, complications can occur. Possible complications include:  Putting a hole in the uterus.  Excessive bleeding.  Infection.  Damage to the cervix.  Injury to other organs.  Allergic reaction to medicines.  Too much fluid used in the uterus for the procedure. BEFORE THE PROCEDURE  Ask your health care provider about changing or stopping any regular medicines.  Do not take aspirin or blood thinners for 1 week before the procedure, or as directed by your health care provider. These can cause bleeding.  If you smoke, do not smoke for 2 weeks before the procedure.  In some cases, a medicine is placed in the cervix the day before the procedure. This medicine makes the cervix have a larger opening (dilate). This makes it easier for the instrument to be inserted into the uterus during the procedure.  Do not eat or drink anything for at least 8 hours before the surgery.  Arrange for someone to take you home after the procedure. PROCEDURE   You may be given a medicine to relax you (sedative). You may also be given one of the  following:  A medicine that numbs the area around the cervix (local anesthetic).  A medicine that makes you sleep through the procedure (general anesthetic).  The hysteroscope is inserted through the vagina into the uterus. The camera on the hysteroscope sends a picture to a TV screen. This gives the surgeon a good view inside the uterus.  During the procedure, air or a liquid is put into the uterus, which allows the surgeon to see better.  Sometimes, tissue is gently scraped from inside the uterus. These tissue samples are sent to a lab for testing. AFTER THE PROCEDURE   If you had a general anesthetic, you may be groggy for a couple hours after the procedure.  If you had a local anesthetic, you will be able to go home as soon as you are stable and feel ready.  You may have some cramping. This normally lasts for a couple days.  You may have bleeding, which varies from light spotting for a few days to menstrual-like bleeding for 3-7 days. This is normal.  If your test results are not back during the visit, make an appointment with your health care provider to find out the results.   This information is not intended to replace advice given to you by your health care provider. Make sure you discuss any questions you have with your health care provider.   Document Released: 03/03/2001 Document Revised: 09/15/2013 Document Reviewed: 06/24/2013 Elsevier Interactive Patient Education 2016 [place]. Hysteroscopy, Care After Refer to this sheet in the next few weeks. These instructions provide you with information on caring for yourself after your procedure. Your health care provider may also give you more specific instructions. Your treatment has been planned according to current medical practices, but problems sometimes occur. Call your health care provider if you have any problems or questions after your procedure.  WHAT TO EXPECT AFTER THE PROCEDURE After your procedure, it is typical  to have the following:  You may have some cramping. This normally lasts for a couple days.  You may have bleeding. This can vary from light spotting for a few days to menstrual-like bleeding for 3-7 days. HOME CARE INSTRUCTIONS  Rest for the first 1-2 days after the procedure.  Only take over-the-counter or prescription medicines as directed by your health care provider. Do not take aspirin. It can increase the chances of bleeding.  Take showers instead of baths for 2 weeks or as directed by your health care provider.  Do not drive for 24 hours or as directed.  Do not drink alcohol while taking pain medicine.  Do not use tampons, douche, or have sexual intercourse for 2 weeks or until your  health care provider says it is okay.  Take your temperature twice a day for 4-5 days. Write it down each time.  Follow your health care provider's advice about diet, exercise, and lifting.  If you develop constipation, you may:  Take a mild laxative if your health care provider approves.  Add bran foods to your diet.  Drink enough fluids to keep your urine clear or pale yellow.  Try to have someone with you or available to you for the first 24-48 hours, especially if you were given a general anesthetic.  Follow up with your health care provider as directed. SEEK MEDICAL CARE IF:  You feel dizzy or lightheaded.  You feel sick to your stomach (nauseous).  You have abnormal vaginal discharge.  You have a rash.  You have pain that is not controlled with medicine. SEEK IMMEDIATE MEDICAL CARE IF:  You have bleeding that is heavier than a normal menstrual period.  You have a fever.  You have increasing cramps or pain, not controlled with medicine.  You have new belly (abdominal) pain.  You pass out.  You have pain in the tops of your shoulders (shoulder strap areas).  You have shortness of breath.   This information is not intended to replace advice given to you by your health  care provider. Make sure you discuss any questions you have with your health care provider.   Document Released: 09/15/2013 Document Reviewed: 09/15/2013 Elsevier Interactive Patient Education 2016 [employer] Dilation and Curettage or Vacuum Curettage Dilation and curettage (D&C) and vacuum curettage are minor procedures. A D&C involves stretching (dilation) the cervix and scraping (curettage) the inside lining of the womb (uterus). During a D&C, tissue is gently scraped from the inside lining of the uterus. During a vacuum curettage, the lining and tissue in the uterus are removed with the use of gentle suction.  Curettage may be performed to either diagnose or treat a problem. As a diagnostic procedure, curettage is performed to examine tissues from the uterus. A diagnostic curettage may be performed for the following symptoms:   Irregular bleeding in the uterus.   Bleeding with the development of clots.   Spotting between menstrual periods.   Prolonged menstrual periods.   Bleeding after menopause.   No menstrual period (amenorrhea).   A change in size and shape of the uterus.  As a treatment procedure, curettage may be performed for the following reasons:   Removal of an IUD (intrauterine device).   Removal of retained placenta after giving birth. Retained placenta can cause an infection or bleeding severe enough to require transfusions.   Abortion.   Miscarriage.   Removal of polyps inside the uterus.   Removal of uncommon types of noncancerous lumps (fibroids).  LET [facility] CARE PROVIDER KNOW ABOUT:   Any allergies you have.   All medicines you are taking, including vitamins, herbs, eye drops, creams, and over-the-counter medicines.   Previous problems you or members of your family have had with the use of anesthetics.   Any blood disorders you have.   Previous surgeries you have had.   Medical conditions you have. RISKS AND COMPLICATIONS   Generally, this is a safe procedure. However, as with any procedure, complications can occur. Possible complications include:  Excessive bleeding.   Infection of the uterus.   Damage to the cervix.   Development of scar tissue (adhesions) inside the uterus, later causing abnormal amounts of menstrual bleeding.   Complications from the general anesthetic, if a  general anesthetic is used.   Putting a hole (perforation) in the uterus. This is rare.  BEFORE THE PROCEDURE   Eat and drink before the procedure only as directed by your health care provider.   Arrange for someone to take you home.  PROCEDURE  This procedure usually takes about 15-30 minutes.  You will be given one of the following:  A medicine that numbs the area in and around the cervix (local anesthetic).   A medicine to make you sleep through the procedure (general anesthetic).  You will lie on your back with your legs in stirrups.   A warm metal or plastic instrument (speculum) will be placed in your vagina to keep it open and to allow the health care provider to see the cervix.  There are two ways in which your cervix can be softened and dilated. These include:   Taking a medicine.   Having thin rods (laminaria) inserted into your cervix.   A curved tool (curette) will be used to scrape cells from the inside lining of the uterus. In some cases, gentle suction is applied with the curette. The curette will then be removed.  AFTER THE PROCEDURE   You will rest in the recovery area until you are stable and are ready to go home.   You may feel sick to your stomach (nauseous) or throw up (vomit) if you were given a general anesthetic.   You may have a sore throat if a tube was placed in your throat during general anesthesia.   You may have light cramping and bleeding. This may last for 2 days to 2 weeks after the procedure.   Your uterus needs to make a new lining after the procedure. This  may make your next period late.   This information is not intended to replace advice given to you by your health care provider. Make sure you discuss any questions you have with your health care provider.   Document Released: 11/25/2005 Document Revised: 07/28/2013 Document Reviewed: 06/24/2013 Elsevier Interactive Patient Education 2016 [employer] Inc[employer] Dilation and Curettage or Vacuum Curettage, Care After Refer to this sheet in the next few weeks. These instructions provide you with information on caring for yourself after your procedure. Your health care provider may also give you more specific instructions. Your treatment has been planned according to current medical practices, but problems sometimes occur. Call your health care provider if you have any problems or questions after your procedure. WHAT TO EXPECT AFTER THE PROCEDURE After your procedure, it is typical to have light cramping and bleeding. This may last for 2 days to 2 weeks after the procedure. HOME CARE INSTRUCTIONS   Do not drive for 24 hours.  Wait 1 week before returning to strenuous activities.  Take your temperature 2 times a day for 4 days and write it down. Provide these temperatures to your health care provider if you develop a fever.  Avoid long periods of standing.  Avoid heavy lifting, pushing, or pulling. Do not lift anything heavier than 10 pounds (4.5 kg).  Limit stair climbing to once or twice a day.  Take rest periods often.  You may resume your usual diet.  Drink enough fluids to keep your urine clear or pale yellow.  Your usual bowel function should return. If you have constipation, you may:  Take a mild laxative with permission from your health care provider.  Add fruit and bran to your diet.  Drink more fluids.  Take showers instead of  baths until your health care provider gives you permission to take baths.  Do not go swimming or use a hot tub until your health care provider  approves.  Try to have someone with you or available to you the first 24-48 hours, especially if you were given a general anesthetic.  Do not douche, use tampons, or have sex (intercourse) for 2 weeks after the procedure.  Only take over-the-counter or prescription medicines as directed by your health care provider. Do not take aspirin. It can cause bleeding.  Follow up with your health care provider as directed. SEEK MEDICAL CARE IF:   You have increasing cramps or pain that is not relieved with medicine.  You have abdominal pain that does not seem to be related to the same area of earlier cramping and pain.  You have bad smelling vaginal discharge.  You have a rash.  You are having problems with any medicine. SEEK IMMEDIATE MEDICAL CARE IF:   You have bleeding that is heavier than a normal menstrual period.  You have a fever.  You have chest pain.  You have shortness of breath.  You feel dizzy or feel like fainting.  You pass out.  You have pain in your shoulder strap area.  You have heavy vaginal bleeding with or without blood clots. MAKE SURE YOU:   Understand these instructions.  Will watch your condition.  Will get help right away if you are not doing well or get worse.   This information is not intended to replace advice given to you by your health care provider. Make sure you discuss any questions you have with your health care provider.   Document Released: 11/22/2000 Document Revised: 11/30/2013 Document Reviewed: 06/24/2013 Elsevier Interactive Patient Education 2016 [place] Anesthesia, Adult General anesthesia is a sleep-like state of non-feeling produced by medicines (anesthetics). General anesthesia prevents you from being alert and feeling pain during a medical procedure. Your caregiver may recommend general anesthesia if your procedure:  Is long.  Is painful or uncomfortable.  Would be frightening to see or hear.  Requires you  to be still.  Affects your breathing.  Causes significant blood loss. LET YOUR CAREGIVER KNOW ABOUT:  Allergies to food or medicine.  Medicines taken, including vitamins, herbs, eyedrops, over-the-counter medicines, and creams.  Use of steroids (by mouth or creams).  Previous problems with anesthetics or numbing medicines, including problems experienced by relatives.  History of bleeding problems or blood clots.  Previous surgeries and types of anesthetics received.  Possibility of pregnancy, if this applies.  Use of cigarettes, alcohol, or illegal drugs.  Any health condition(s), especially diabetes, sleep apnea, and high blood pressure. RISKS AND COMPLICATIONS General anesthesia rarely causes complications. However, if complications do occur, they can be life threatening. Complications include:  A lung infection.  A stroke.  A heart attack.  Waking up during the procedure. When this occurs, the patient may be unable to move and communicate that he or she is awake. The patient may feel severe pain. Older adults and adults with serious medical problems are more likely to have complications than adults who are young and healthy. Some complications can be prevented by answering all of your caregiver's questions thoroughly and by following all pre-procedure instructions. It is important to tell your caregiver if any of the pre-procedure instructions, especially those related to diet, were not followed. Any food or liquid in the stomach can cause problems when you are under general anesthesia. BEFORE THE PROCEDURE  Ask your caregiver if you will have to spend the night at the hospital. If you will not have to spend the night, arrange to have an adult drive you and stay with you for 24 hours.  Follow your caregiver's instructions if you are taking dietary supplements or medicines. Your caregiver may tell you to stop taking them or to reduce your dosage.  Do not smoke for as long  as possible before your procedure. If possible, stop smoking 3-6 weeks before the procedure.  Do not take new dietary supplements or medicines within 1 week of your procedure unless your caregiver approves them.  Do not eat within 8 hours of your procedure or as directed by your caregiver. Drink only clear liquids, such as water, black coffee (without milk or cream), and fruit juices (without pulp).  Do not drink within 3 hours of your procedure or as directed by your caregiver.  You may brush your teeth on the morning of the procedure, but make sure to spit out the toothpaste and water when finished. PROCEDURE  You will receive anesthetics through a mask, through an intravenous (IV) access tube, or through both. A doctor who specializes in anesthesia (anesthesiologist) or a nurse who specializes in anesthesia (nurse anesthetist) or both will stay with you throughout the procedure to make sure you remain unconscious. He or she will also watch your blood pressure, pulse, and oxygen levels to make sure that the anesthetics do not cause any problems. Once you are asleep, a breathing tube or mask may be used to help you breathe. AFTER THE PROCEDURE You will wake up after the procedure is complete. You may be in the room where the procedure was performed or in a recovery area. You may have a sore throat if a breathing tube was used. You may also feel:  Dizzy.  Weak.  Drowsy.  Confused.  Nauseous.  Cold. These are all normal responses and can be expected to last for up to 24 hours after the procedure is complete. A caregiver will tell you when you are ready to go home. This will usually be when you are fully awake and in stable condition.   This information is not intended to replace advice given to you by your health care provider. Make sure you discuss any questions you have with your health care provider.   Document Released: 03/03/2008 Document Revised: 12/16/2014 Document Reviewed:  03/25/2012 Elsevier Interactive Patient Education 2016 [place] Anesthesia, Adult, Care After Refer to this sheet in the next few weeks. These instructions provide you with information on caring for yourself after your procedure. Your health care provider may also give you more specific instructions. Your treatment has been planned according to current medical practices, but problems sometimes occur. Call your health care provider if you have any problems or questions after your procedure. WHAT TO EXPECT AFTER THE PROCEDURE After the procedure, it is typical to experience:  Sleepiness.  Nausea and vomiting. HOME CARE INSTRUCTIONS  For the first 24 hours after general anesthesia:  Have a responsible person with you.  Do not drive a car. If you are alone, do not take public transportation.  Do not drink alcohol.  Do not take medicine that has not been prescribed by your health care provider.  Do not sign important papers or make important decisions.  You may resume a normal diet and activities as directed by your health care provider.  Change bandages (dressings) as directed.  If you have questions or  problems that seem related to general anesthesia, call the hospital and ask for the anesthetist or anesthesiologist on call. SEEK MEDICAL CARE IF:  You have nausea and vomiting that continue the day after anesthesia.  You develop a rash. SEEK IMMEDIATE MEDICAL CARE IF:   You have difficulty breathing.  You have chest pain.  You have any allergic problems.   This information is not intended to replace advice given to you by your health care provider. Make sure you discuss any questions you have with your health care provider.   Document Released: 03/03/2001 Document Revised: 12/16/2014 Document Reviewed: 03/25/2012 Elsevier Interactive Patient Education 2016 [employer] PATIENT INSTRUCTIONS POST-ANESTHESIA  IMMEDIATELY FOLLOWING SURGERY:  Do not drive or  operate machinery for the first twenty four hours after surgery.  Do not make any important decisions for twenty four hours after surgery or while taking narcotic pain medications or sedatives.  If you develop intractable nausea and vomiting or a severe headache please notify your doctor immediately.  FOLLOW-UP:  Please make an appointment with your surgeon as instructed. You do not need to follow up with anesthesia unless specifically instructed to do so.  WOUND CARE INSTRUCTIONS (if applicable):  Keep a dry clean dressing on the anesthesia/puncture wound site if there is drainage.  Once the wound has quit draining you may leave it open to air.  Generally you should leave the bandage intact for twenty four hours unless there is drainage.  If the epidural site drains for more than 36-48 hours please call the anesthesia department.  QUESTIONS?:  Please feel free to call your physician or the hospital operator if you have any questions, and they will be happy to assist you.

## 2016-08-01 ENCOUNTER — Encounter (HOSPITAL_COMMUNITY)
Admission: RE | Admit: 2016-08-01 | Discharge: 2016-08-01 | Disposition: A | Discharge status: Home / Self Care | Payer: Medicare Other | Source: Ambulatory Visit | Attending: Obstetrics & Gynecology | Admitting: Obstetrics & Gynecology

## 2016-08-01 ENCOUNTER — Encounter (HOSPITAL_COMMUNITY): Payer: Self-pay

## 2016-08-01 ENCOUNTER — Other Ambulatory Visit: Payer: Self-pay | Admitting: Obstetrics & Gynecology

## 2016-08-01 DIAGNOSIS — Z01812 Encounter for preprocedural laboratory examination: Secondary | ICD-10-CM | POA: Diagnosis not present

## 2016-08-01 LAB — COMPREHENSIVE METABOLIC PANEL
ALK PHOS: 95 U/L (ref 38–126)
ALT: 26 U/L (ref 14–54)
AST: 22 U/L (ref 15–41)
Albumin: 3.8 g/dL (ref 3.5–5.0)
Anion gap: 7 (ref 5–15)
BILIRUBIN TOTAL: 0.7 mg/dL (ref 0.3–1.2)
BUN: 18 mg/dL (ref 6–20)
CALCIUM: 8.8 mg/dL — AB (ref 8.9–10.3)
CHLORIDE: 109 mmol/L (ref 101–111)
CO2: 26 mmol/L (ref 22–32)
CREATININE: 1.35 mg/dL — AB (ref 0.44–1.00)
GFR, EST AFRICAN AMERICAN: 53 mL/min — AB (ref >60)
GFR, EST NON AFRICAN AMERICAN: 46 mL/min — AB (ref >60)
Glucose, Bld: 196 mg/dL — ABNORMAL HIGH (ref 65–99)
Potassium: 3.5 mmol/L (ref 3.5–5.1)
Sodium: 142 mmol/L (ref 135–145)
TOTAL PROTEIN: 6.6 g/dL (ref 6.5–8.1)

## 2016-08-01 LAB — URINALYSIS, ROUTINE W REFLEX MICROSCOPIC
BILIRUBIN URINE: NEGATIVE
Glucose, UA: NEGATIVE mg/dL
KETONES UR: NEGATIVE mg/dL
Nitrite: NEGATIVE
PROTEIN: NEGATIVE mg/dL
Specific Gravity, Urine: 1.01 (ref 1.005–1.030)
pH: 6.5 (ref 5.0–8.0)

## 2016-08-01 LAB — URINE MICROSCOPIC-ADD ON: Bacteria, UA: NONE SEEN

## 2016-08-01 LAB — CBC
HCT: 39.9 % (ref 36.0–46.0)
Hemoglobin: 13.6 g/dL (ref 12.0–15.0)
MCH: 32 pg (ref 26.0–34.0)
MCHC: 34.1 g/dL (ref 30.0–36.0)
MCV: 93.9 fL (ref 78.0–100.0)
PLATELETS: 253 10*3/uL (ref 150–400)
RBC: 4.25 MIL/uL (ref 3.87–5.11)
RDW: 12.9 % (ref 11.5–15.5)
WBC: 7.3 10*3/uL (ref 4.0–10.5)

## 2016-08-01 LAB — HCG, QUANTITATIVE, PREGNANCY: HCG, BETA CHAIN, QUANT, S: 1 m[IU]/mL (ref <5)

## 2016-08-07 ENCOUNTER — Ambulatory Visit (HOSPITAL_COMMUNITY): Payer: Medicare Other | Admitting: Anesthesiology

## 2016-08-07 ENCOUNTER — Ambulatory Visit (HOSPITAL_COMMUNITY)
Admission: RE | Admit: 2016-08-07 | Discharge: 2016-08-07 | Disposition: A | Discharge status: Home / Self Care | Payer: Medicare Other | Source: Ambulatory Visit | Attending: Obstetrics & Gynecology | Admitting: Obstetrics & Gynecology

## 2016-08-07 ENCOUNTER — Encounter (HOSPITAL_COMMUNITY): Payer: Self-pay | Admitting: *Deleted

## 2016-08-07 ENCOUNTER — Encounter (HOSPITAL_COMMUNITY)
Admission: RE | Disposition: A | Discharge status: Home / Self Care | Payer: Self-pay | Source: Ambulatory Visit | Attending: Obstetrics & Gynecology

## 2016-08-07 DIAGNOSIS — N920 Excessive and frequent menstruation with regular cycle: Secondary | ICD-10-CM | POA: Diagnosis not present

## 2016-08-07 DIAGNOSIS — N912 Amenorrhea, unspecified: Secondary | ICD-10-CM | POA: Insufficient documentation

## 2016-08-07 DIAGNOSIS — Z8049 Family history of malignant neoplasm of other genital organs: Secondary | ICD-10-CM | POA: Diagnosis not present

## 2016-08-07 DIAGNOSIS — N8500 Endometrial hyperplasia, unspecified: Secondary | ICD-10-CM | POA: Diagnosis present

## 2016-08-07 DIAGNOSIS — N8502 Endometrial intraepithelial neoplasia [EIN]: Secondary | ICD-10-CM | POA: Insufficient documentation

## 2016-08-07 DIAGNOSIS — I471 Supraventricular tachycardia: Secondary | ICD-10-CM | POA: Insufficient documentation

## 2016-08-07 DIAGNOSIS — N840 Polyp of corpus uteri: Secondary | ICD-10-CM | POA: Diagnosis not present

## 2016-08-07 DIAGNOSIS — G4733 Obstructive sleep apnea (adult) (pediatric): Secondary | ICD-10-CM | POA: Insufficient documentation

## 2016-08-07 DIAGNOSIS — J449 Chronic obstructive pulmonary disease, unspecified: Secondary | ICD-10-CM | POA: Insufficient documentation

## 2016-08-07 DIAGNOSIS — Z6841 Body Mass Index (BMI) 40.0 and over, adult: Secondary | ICD-10-CM | POA: Diagnosis not present

## 2016-08-07 DIAGNOSIS — Z8673 Personal history of transient ischemic attack (TIA), and cerebral infarction without residual deficits: Secondary | ICD-10-CM | POA: Diagnosis not present

## 2016-08-07 DIAGNOSIS — I5033 Acute on chronic diastolic (congestive) heart failure: Secondary | ICD-10-CM | POA: Diagnosis not present

## 2016-08-07 DIAGNOSIS — I110 Hypertensive heart disease with heart failure: Secondary | ICD-10-CM | POA: Insufficient documentation

## 2016-08-07 DIAGNOSIS — Z7984 Long term (current) use of oral hypoglycemic drugs: Secondary | ICD-10-CM | POA: Diagnosis not present

## 2016-08-07 DIAGNOSIS — E119 Type 2 diabetes mellitus without complications: Secondary | ICD-10-CM | POA: Diagnosis not present

## 2016-08-07 DIAGNOSIS — N921 Excessive and frequent menstruation with irregular cycle: Secondary | ICD-10-CM | POA: Diagnosis not present

## 2016-08-07 HISTORY — PX: DILITATION & CURRETTAGE/HYSTROSCOPY WITH NOVASURE ABLATION: SHX5568

## 2016-08-07 LAB — GLUCOSE, CAPILLARY: Glucose-Capillary: 91 mg/dL (ref 65–99)

## 2016-08-07 SURGERY — DILATATION & CURETTAGE/HYSTEROSCOPY WITH NOVASURE ABLATION
Anesthesia: General | Site: Cervix

## 2016-08-07 MED ORDER — SUCCINYLCHOLINE CHLORIDE 20 MG/ML IJ SOLN
INTRAMUSCULAR | Status: DC | PRN
  Administered 2016-08-07: 160 mg via INTRAVENOUS
  Administered 2016-08-07: 40 mg via INTRAVENOUS

## 2016-08-07 MED ORDER — LACTATED RINGERS IV SOLN
INTRAVENOUS | Status: DC
  Administered 2016-08-07 (×2): via INTRAVENOUS

## 2016-08-07 MED ORDER — GLYCOPYRROLATE 0.2 MG/ML IJ SOLN
0.2000 mg | Freq: Once | INTRAMUSCULAR | Status: AC
  Administered 2016-08-07: 0.2 mg via INTRAVENOUS

## 2016-08-07 MED ORDER — ONDANSETRON HCL 4 MG/2ML IJ SOLN
4.0000 mg | Freq: Once | INTRAMUSCULAR | Status: AC
  Administered 2016-08-07: 4 mg via INTRAVENOUS

## 2016-08-07 MED ORDER — PROMETHAZINE HCL 25 MG/ML IJ SOLN: 6.2500 mg | Freq: Four times a day (QID) | INTRAMUSCULAR | Status: DC | PRN

## 2016-08-07 MED ORDER — LIDOCAINE HCL (PF) 1 % IJ SOLN
INTRAMUSCULAR | Status: AC
  Filled 2016-08-07: qty 5

## 2016-08-07 MED ORDER — CEFAZOLIN SODIUM-DEXTROSE 2-4 GM/100ML-% IV SOLN
INTRAVENOUS | Status: AC
  Filled 2016-08-07: qty 100

## 2016-08-07 MED ORDER — ONDANSETRON HCL 8 MG PO TABS: 8.0000 mg | ORAL_TABLET | Freq: Three times a day (TID) | ORAL | 0 refills | Status: DC | PRN

## 2016-08-07 MED ORDER — PROMETHAZINE HCL 25 MG/ML IJ SOLN
6.2500 mg | Freq: Once | INTRAMUSCULAR | Status: AC
  Administered 2016-08-07: 6.25 mg via INTRAVENOUS

## 2016-08-07 MED ORDER — SODIUM CHLORIDE 0.9% FLUSH
INTRAVENOUS | Status: AC
  Filled 2016-08-07: qty 10

## 2016-08-07 MED ORDER — MEDROXYPROGESTERONE ACETATE 150 MG/ML IM SUSP
300.0000 mg | Freq: Once | INTRAMUSCULAR | Status: DC
  Filled 2016-08-07: qty 2

## 2016-08-07 MED ORDER — HYDROCODONE-ACETAMINOPHEN 5-325 MG PO TABS: 1.0000 | ORAL_TABLET | Freq: Four times a day (QID) | ORAL | 0 refills | Status: DC | PRN

## 2016-08-07 MED ORDER — PROMETHAZINE HCL 25 MG/ML IJ SOLN
INTRAMUSCULAR | Status: AC
  Filled 2016-08-07: qty 1

## 2016-08-07 MED ORDER — NEOSTIGMINE METHYLSULFATE 10 MG/10ML IV SOLN
INTRAVENOUS | Status: DC | PRN
  Administered 2016-08-07: 2 mg via INTRAVENOUS
  Administered 2016-08-07: 3 mg via INTRAVENOUS

## 2016-08-07 MED ORDER — GLYCOPYRROLATE 0.2 MG/ML IJ SOLN
INTRAMUSCULAR | Status: DC | PRN
  Administered 2016-08-07: 0.6 mg via INTRAVENOUS

## 2016-08-07 MED ORDER — MEDROXYPROGESTERONE ACETATE 150 MG/ML IM SUSP
300.0000 mg | Freq: Once | INTRAMUSCULAR | Status: DC
Start: 2016-08-07 — End: 2019-04-06

## 2016-08-07 MED ORDER — LIDOCAINE HCL 1 % IJ SOLN
INTRAMUSCULAR | Status: DC | PRN
  Administered 2016-08-07: 30 mg via INTRADERMAL

## 2016-08-07 MED ORDER — HYDROMORPHONE HCL 1 MG/ML IJ SOLN
0.2500 mg | INTRAMUSCULAR | Status: DC | PRN
  Administered 2016-08-07 (×2): 0.5 mg via INTRAVENOUS
  Filled 2016-08-07: qty 1

## 2016-08-07 MED ORDER — FENTANYL CITRATE (PF) 100 MCG/2ML IJ SOLN
INTRAMUSCULAR | Status: DC | PRN
  Administered 2016-08-07 (×3): 50 ug via INTRAVENOUS

## 2016-08-07 MED ORDER — MIDAZOLAM HCL 2 MG/2ML IJ SOLN
1.0000 mg | INTRAMUSCULAR | Status: DC | PRN
  Administered 2016-08-07 (×2): 2 mg via INTRAVENOUS
  Filled 2016-08-07: qty 2

## 2016-08-07 MED ORDER — SUCCINYLCHOLINE CHLORIDE 20 MG/ML IJ SOLN
INTRAMUSCULAR | Status: AC
  Filled 2016-08-07: qty 1

## 2016-08-07 MED ORDER — ROCURONIUM BROMIDE 50 MG/5ML IV SOLN
INTRAVENOUS | Status: AC
  Filled 2016-08-07: qty 1

## 2016-08-07 MED ORDER — KETOROLAC TROMETHAMINE 30 MG/ML IJ SOLN
30.0000 mg | Freq: Once | INTRAMUSCULAR | Status: AC
  Administered 2016-08-07: 30 mg via INTRAVENOUS

## 2016-08-07 MED ORDER — KETOROLAC TROMETHAMINE 30 MG/ML IJ SOLN
INTRAMUSCULAR | Status: AC
Start: 2016-08-07 — End: 2016-08-07
  Filled 2016-08-07: qty 1

## 2016-08-07 MED ORDER — GLYCOPYRROLATE 0.2 MG/ML IJ SOLN
INTRAMUSCULAR | Status: AC
  Filled 2016-08-07: qty 1

## 2016-08-07 MED ORDER — CEFAZOLIN SODIUM-DEXTROSE 2-4 GM/100ML-% IV SOLN
2.0000 g | INTRAVENOUS | Status: AC
  Administered 2016-08-07: 2 g via INTRAVENOUS

## 2016-08-07 MED ORDER — ROCURONIUM BROMIDE 100 MG/10ML IV SOLN
INTRAVENOUS | Status: DC | PRN
  Administered 2016-08-07: 20 mg via INTRAVENOUS
  Administered 2016-08-07: 5 mg via INTRAVENOUS

## 2016-08-07 MED ORDER — KETOROLAC TROMETHAMINE 10 MG PO TABS: 10.0000 mg | ORAL_TABLET | Freq: Three times a day (TID) | ORAL | 0 refills | Status: DC | PRN

## 2016-08-07 MED ORDER — SODIUM CHLORIDE 0.9 % IR SOLN
Status: DC | PRN
  Administered 2016-08-07: 1000 mL

## 2016-08-07 MED ORDER — MIDAZOLAM HCL 5 MG/5ML IJ SOLN
INTRAMUSCULAR | Status: DC | PRN
  Administered 2016-08-07: 2 mg via INTRAVENOUS

## 2016-08-07 MED ORDER — NEOSTIGMINE METHYLSULFATE 10 MG/10ML IV SOLN
INTRAVENOUS | Status: AC
  Filled 2016-08-07: qty 1

## 2016-08-07 MED ORDER — ONDANSETRON HCL 4 MG/2ML IJ SOLN
INTRAMUSCULAR | Status: AC
  Filled 2016-08-07: qty 2

## 2016-08-07 MED ORDER — PROPOFOL 10 MG/ML IV BOLUS
INTRAVENOUS | Status: DC | PRN
  Administered 2016-08-07: 170 mg via INTRAVENOUS
  Administered 2016-08-07: 30 mg via INTRAVENOUS

## 2016-08-07 MED ORDER — FENTANYL CITRATE (PF) 100 MCG/2ML IJ SOLN
INTRAMUSCULAR | Status: AC
  Filled 2016-08-07: qty 4

## 2016-08-07 MED ORDER — MEDROXYPROGESTERONE ACETATE 150 MG/ML IM SUSP
300.0000 mg | Freq: Once | INTRAMUSCULAR | Status: DC
Start: 2016-08-07 — End: 2016-08-07

## 2016-08-07 MED ORDER — GLYCOPYRROLATE 0.2 MG/ML IJ SOLN
INTRAMUSCULAR | Status: AC
  Filled 2016-08-07: qty 3

## 2016-08-07 MED ORDER — MIDAZOLAM HCL 2 MG/2ML IJ SOLN
INTRAMUSCULAR | Status: AC
  Filled 2016-08-07: qty 2

## 2016-08-07 MED ORDER — SODIUM CHLORIDE 0.9 % IJ SOLN
INTRAMUSCULAR | Status: AC
  Filled 2016-08-07: qty 10

## 2016-08-07 SURGICAL SUPPLY — 27 items
ABLATOR ENDOMETRIAL BIPOLAR (ABLATOR) ×3 IMPLANT
BAG HAMPER (MISCELLANEOUS) ×3 IMPLANT
CLOTH BEACON ORANGE TIMEOUT ST (SAFETY) ×3 IMPLANT
COVER LIGHT HANDLE STERIS (MISCELLANEOUS) ×6 IMPLANT
FORMALIN 10 PREFIL 120ML (MISCELLANEOUS) ×6 IMPLANT
GAUZE SPONGE 4X4 16PLY XRAY LF (GAUZE/BANDAGES/DRESSINGS) ×6 IMPLANT
GLOVE BIOGEL PI IND STRL 7.0 (GLOVE) ×2 IMPLANT
GLOVE BIOGEL PI IND STRL 8 (GLOVE) ×1 IMPLANT
GLOVE BIOGEL PI INDICATOR 7.0 (GLOVE) ×4
GLOVE BIOGEL PI INDICATOR 8 (GLOVE) ×2
GLOVE ECLIPSE 8.0 STRL XLNG CF (GLOVE) ×3 IMPLANT
GOWN STRL REUS W/TWL LRG LVL3 (GOWN DISPOSABLE) ×3 IMPLANT
GOWN STRL REUS W/TWL XL LVL3 (GOWN DISPOSABLE) ×3 IMPLANT
INST SET HYSTEROSCOPY (KITS) ×3 IMPLANT
IV NS 1000ML (IV SOLUTION) ×2
IV NS 1000ML BAXH (IV SOLUTION) ×1 IMPLANT
KIT ROOM TURNOVER AP CYSTO (KITS) ×3 IMPLANT
MANIFOLD NEPTUNE II (INSTRUMENTS) ×3 IMPLANT
NS IRRIG 1000ML POUR BTL (IV SOLUTION) ×3 IMPLANT
PACK BASIC III (CUSTOM PROCEDURE TRAY) ×2
PACK SRG BSC III STRL LF ECLPS (CUSTOM PROCEDURE TRAY) ×1 IMPLANT
PAD ARMBOARD 7.5X6 YLW CONV (MISCELLANEOUS) ×3 IMPLANT
PAD TELFA 3X4 1S STER (GAUZE/BANDAGES/DRESSINGS) ×3 IMPLANT
SET BASIN LINEN APH (SET/KITS/TRAYS/PACK) ×3 IMPLANT
SET IRRIG Y TYPE TUR BLADDER L (SET/KITS/TRAYS/PACK) ×3 IMPLANT
SHEET LAVH (DRAPES) ×3 IMPLANT
YANKAUER SUCT BULB TIP 10FT TU (MISCELLANEOUS) IMPLANT

## 2016-08-07 NOTE — Discharge Instructions (Signed)
Keep follow up appointment Pick up Depo Provera injection from pharmacy prior to office visit and it will be given at that visit Hydrocodone 5-325 mg 1 tablet every 6 hours as needed   PATIENT INSTRUCTIONS POST-ANESTHESIA  IMMEDIATELY FOLLOWING SURGERY:  Do not drive or operate machinery for the first twenty four hours after surgery.  Do not make any important decisions for twenty four hours after surgery or while taking narcotic pain medications or sedatives.  If you develop intractable nausea and vomiting or a severe headache please notify your doctor immediately.  FOLLOW-UP:  Please make an appointment with your surgeon as instructed. You do not need to follow up with anesthesia unless specifically instructed to do so.  WOUND CARE INSTRUCTIONS (if applicable):  Keep a dry clean dressing on the anesthesia/puncture wound site if there is drainage.  Once the wound has quit draining you may leave it open to air.  Generally you should leave the bandage intact for twenty four hours unless there is drainage.  If the epidural site drains for more than 36-48 hours please call the anesthesia department.  QUESTIONS?:  Please feel free to call your physician or the hospital operator if you have any questions, and they will be happy to assist you.      Intrauterine Device Information An intrauterine device (IUD) is inserted into your uterus to prevent pregnancy. There are two types of IUDs available:   Copper IUD--This type of IUD is wrapped in copper wire and is placed inside the uterus. Copper makes the uterus and fallopian tubes produce a fluid that kills sperm. The copper IUD can stay in place for 10 years.  Hormone IUD--This type of IUD contains the hormone progestin (synthetic progesterone). The hormone thickens the cervical mucus and prevents sperm from entering the uterus. It also thins the uterine lining to prevent implantation of a fertilized egg. The hormone can weaken or kill the sperm  that get into the uterus. One type of hormone IUD can stay in place for 5 years, and another type can stay in place for 3 years. Your health care provider will make sure you are a good candidate for a contraceptive IUD. Discuss with your health care provider the possible side effects.  ADVANTAGES OF AN INTRAUTERINE DEVICE  IUDs are highly effective, reversible, long acting, and low maintenance.   There are no estrogen-related side effects.   An IUD can be used when breastfeeding.   IUDs are not associated with weight gain.   The copper IUD works immediately after insertion.   The hormone IUD works right away if inserted within 7 days of your period starting. You will need to use a backup method of birth control for 7 days if the hormone IUD is inserted at any other time in your cycle.  The copper IUD does not interfere with your female hormones.   The hormone IUD can make heavy menstrual periods lighter and decrease cramping.   The hormone IUD can be used for 3 or 5 years.   The copper IUD can be used for 10 years. DISADVANTAGES OF AN INTRAUTERINE DEVICE  The hormone IUD can be associated with irregular bleeding patterns.   The copper IUD can make your menstrual flow heavier and more painful.   You may experience cramping and vaginal bleeding after insertion.    This information is not intended to replace advice given to you by your health care provider. Make sure you discuss any questions you have with your  health care provider.   Document Released: 10/29/2004 Document Revised: 07/28/2013 Document Reviewed: 05/16/2013 Elsevier Interactive Patient Education [employer].

## 2016-08-07 NOTE — Anesthesia Postprocedure Evaluation (Signed)
Anesthesia Post Note  Patient: Erin Sexton  Procedure(s) Performed: Procedure(s) (LRB): DILATATION & CURETTAGE/HYSTEROSCOPY WITH ATTPEMPTED NOVASURE ABLATION AND INSERTION OF IUD (N/A)  Patient location during evaluation: PACU Anesthesia Type: General Level of consciousness: awake, oriented and patient cooperative Pain management: pain level controlled Vital Signs Assessment: post-procedure vital signs reviewed and stable Respiratory status: spontaneous breathing, nonlabored ventilation and respiratory function stable Cardiovascular status: blood pressure returned to baseline and stable Postop Assessment: no signs of nausea or vomiting Anesthetic complications: no    Last Vitals:  Vitals:   08/07/16 1100 08/07/16 1245  BP:  126/69  Pulse:  65  Resp: (!) 29 15  Temp:  37.1 C    Last Pain:  Vitals:   08/07/16 1245  TempSrc:   PainSc: 5                  Tevon Berhane J

## 2016-08-07 NOTE — Transfer of Care (Signed)
Immediate Anesthesia Transfer of Care Note  Patient: Erin Sexton  Procedure(s) Performed: Procedure(s): DILATATION & CURETTAGE/HYSTEROSCOPY WITH ATTPEMPTED NOVASURE ABLATION AND INSERTION OF IUD (N/A)  Patient Location: PACU  Anesthesia Type:General  Level of Consciousness: awake and patient cooperative  Airway & Oxygen Therapy: Patient Spontanous Breathing and Patient connected to face mask oxygen  Post-op Assessment: Report given to RN, Post -op Vital signs reviewed and stable and Patient moving all extremities  Post vital signs: Reviewed and stable  Last Vitals:  Vitals:   08/07/16 1045 08/07/16 1050  BP: (!) 140/59 96/72  Pulse:    Resp: 19 (!) 22  Temp:      Last Pain:  Vitals:   08/07/16 0924  TempSrc: Oral      Patients Stated Pain Goal: 4 ([id number])  Complications: No apparent anesthesia complications

## 2016-08-07 NOTE — Op Note (Signed)
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   Hysteroscopy, uterine curettage, endometrial ablation using Novasure, attempted, unable to perform due to inability to "seal" the cervix due to large endocervical opening(I did not have to dilate to pass the hysteroscope)                           2.  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 She underwent general anesthesia.  She was placed in the dorsal lithotomy position and prepped and draped in the usual sterile fashion. A Graves speculum was placed and the anterior cervical lip was grasped with a single-tooth tenaculum. The cervix was already dilated and allowed passage of the hysteroscope. Diagnostic hysteroscopy was performed and was found to be normal. A vigorous uterine curettage was then performed and all tissue sent to pathology for evaluation.  There was a large amount.  I then proceeded to perform the Novasure endometrial ablation.  The cervical length was 3.5. The uterus sounded to  9.8 cm yielding a net length of 6.5 cm.  The endometrial cavity was 4.5-5 cm wide. The power was 179 watts.    I was unable to "seal" the cervix with exhaustive attempt made which would not allow the NovaSure treatment phase to be initiated.  I used up to 4 single tooth tenaculums at a time and was still unable to make a good enough seal.  I tried 12 times total,  I then decided to go ahead and place a Mirena IUD for therapeutic reasons related to  the patients history of very heavy vaginal bleeding    All of the equipment worked well throughout the procedure.  The patient was awakened from anesthesia and taken to the recovery room in good stable condition all counts were correct. She received 2 g of Ancef and 30 mg of Toradol preoperatively. She will be discharged from the recovery room and followed up in the office in 1- 2 weeks.  EURE,LUTHER H 08/07/2016 12:26 PM

## 2016-08-07 NOTE — Anesthesia Procedure Notes (Signed)
Procedure Name: Intubation Date/Time: 08/07/2016 11:18 AM Performed by: Charmaine Downs Pre-anesthesia Checklist: Patient identified, Timeout performed, Emergency Drugs available, Suction available and Patient being monitored Patient Re-evaluated:Patient Re-evaluated prior to inductionOxygen Delivery Method: Circle system utilized Preoxygenation: Pre-oxygenation with 100% oxygen Intubation Type: IV induction, Rapid sequence and Cricoid Pressure applied Ventilation: Oral airway inserted - appropriate to patient size and Mask ventilation without difficulty Laryngoscope Size: Mac and 3 Grade View: Grade III Tube size: 7.0 mm Number of attempts: 3 Airway Equipment and Method: Video-laryngoscopy and Stylet Placement Confirmation: ETT inserted through vocal cords under direct vision,  positive ETCO2 and breath sounds checked- equal and bilateral Secured at: 24 cm Tube secured with: Tape Dental Injury: Teeth and Oropharynx as per pre-operative assessment  Difficulty Due To: Difficulty was anticipated and Difficult Airway- due to limited oral opening Future Recommendations: Recommend- induction with short-acting agent, and alternative techniques readily available

## 2016-08-07 NOTE — H&P (Signed)
Preoperative History and Physical  Erin Sexton is a 45 y.o. G0P0 with Patient's last menstrual period was 06/04/2014. admitted for a hysteroscopy uterine curettage endometrial ablation using NovaSure.          Chief Complaint  Patient presents with  . Follow-up    ultrasound today    Blood pressure 100/60, pulse 74, height 5\' 9"  (1.753 m), weight (!) 359 lb (162.8 kg), last menstrual period 06/04/2014.  US Transvaginal Non-ob  Result Date: 07/24/2016 GYNECOLOGIC SONOGRAM DREAM Erin Sexton is a 45 y.o. G0P0  Unknown LMP,she is here for a pelvic sonogram for endometrial hyperplasia. Uterus                      11.5 x 7.4 x 8 cm, homogeneous anteverted uterus Endometrium          33.4 mm, irregular, complex, thickened endometrium Right ovary             2.6 x 1.9 x 2.1 cm, wnl Left ovary                2.2 x 1.5 x 2.1 cm, wnl limited view No free fluid,no pain during ultrasound Technician Comments: PELVIC US TA/TV: Homogeneous anteverted uterus wnl,irregular, complex, thickened endometrium,EEC 33.4 mm,normal ov's bilat,limited view of left ov,no free fluid,no pain during ultrasound [employer] 07/24/2016 2:22 PM Clinical Impression and recommendations: I have reviewed the sonogram results above, combined with the patient's current clinical course, below are my impressions and any appropriate recommendations for management based on the sonographic findings. Uterus is normal, a bit generous in size but no myomata Endometrium is thickened from chronic megestrol therapy Both ovaries are normal EURE,LUTHER H 07/24/2016 2:29 PM   US Pelvis Complete  Result Date: 07/24/2016 GYNECOLOGIC SONOGRAM Erin Sexton is a 45 y.o. G0P0  Unknown LMP,she is here for a pelvic sonogram for endometrial hyperplasia. Uterus                      11.5 x 7.4 x 8 cm, homogeneous anteverted uterus Endometrium          33.4 mm, irregular, complex, thickened endometrium Right ovary             2.6 x 1.9 x  2.1 cm, wnl Left ovary                2.2 x 1.5 x 2.1 cm, wnl limited view No free fluid,no pain during ultrasound Technician Comments: PELVIC US TA/TV: Homogeneous anteverted uterus wnl,irregular, complex, thickened endometrium,EEC 33.4 mm,normal ov's bilat,limited view of left ov,no free fluid,no pain during ultrasound [employer] 07/24/2016 2:22 PM Clinical Impression and recommendations: I have reviewed the sonogram results above, combined with the patient's current clinical course, below are my impressions and any appropriate recommendations for management based on the sonographic findings. Uterus is normal, a bit generous in size but no myomata Endometrium is thickened from chronic megestrol therapy Both ovaries are normal EURE,LUTHER H 07/24/2016 2:29 PM      MEDS ordered this encounter: No orders of the defined types were placed in this encounter.   Orders for this encounter: No orders of the defined types were placed in this encounter.   Plan: Pt and her PCP desire pt to get off of her megestrol Will proceed with hystersocopy uterine curettage endometrial ablation with post op plcement of IUD for chronic on going management of her endometrial hyperplasia   PMH:  Past Medical History:  Diagnosis Date  . Abnormal CT scan    a. 02/2014: evaluated for dyspnea and initially placed on Xarelto for PE because radiologist could not exclude small peripheral pulmonary emboli because of limited contrast. However, after admission for ++vaginal bleeding several days later, it was felt she did NOT have PE - underwent further testing with neg VQ 03/06/14, neg LE duplex.  . Anemia   . Anxiety   . Asthma   . Blood transfusion without reported diagnosis   . CHF (congestive heart failure) ([place])   . COPD (chronic obstructive pulmonary disease) ([place])   . Depression   . Diabetes mellitus without complication ([place])    a. Dx 02/2014.  [name] History of echocardiogram 2015   normal EF, Grade II  diastolic dysfunction  . HOCM (hypertrophic obstructive cardiomyopathy) ([place])    a. Dx [id number].  [name] Hypertension   . Morbid obesity ([place])   . NSVT (nonsustained ventricular tachycardia) ([place])    a. Noted on tele during 02/2014 adm for symptomatic anemia.  [name] SVT (supraventricular tachycardia) ([place])    a. Noted on tele during 02/2014 adm for symptomatic anemia.  . Vaginal bleeding     PSH:    History reviewed. No pertinent surgical history.  POb/GynH:      OB History    Gravida Para Term Preterm AB Living             0   SAB TAB Ectopic Multiple Live Births                  SH:   Social History  Substance Use Topics  . Smoking status: Never Smoker  . Smokeless tobacco: Never Used  . Alcohol use No    FH:    Family History  Problem Relation Age of Onset  . Diabetes Mother   . Arthritis Mother   . Other Mother     WPW  . Uterine cancer Maternal Grandmother   . CAD Neg Hx     No family history of EARLY CAD     Allergies: No Known Allergies  Medications:       Current Facility-Administered Medications:  .  ceFAZolin (ANCEF) IVPB 2g/100 mL premix, 2 g, Intravenous, On Call to OR, Florian Buff, MD .  lactated ringers infusion, , Intravenous, Continuous, Lerry Liner, MD, Last Rate: 75 mL/hr at 08/07/16 1020 .  midazolam (VERSED) injection 1-2 mg, 1-2 mg, Intravenous, Q5 min PRN, Lerry Liner, MD, 2 mg at 08/07/16 1024  Review of Systems:   Review of Systems  Constitutional: Negative for fever, chills, weight loss, malaise/fatigue and diaphoresis.  HENT: Negative for hearing loss, ear pain, nosebleeds, congestion, sore throat, neck pain, tinnitus and ear discharge.   Eyes: Negative for blurred vision, double vision, photophobia, pain, discharge and redness.  Respiratory: Negative for cough, hemoptysis, sputum production, shortness of breath, wheezing and stridor.   Cardiovascular: Negative for chest pain, palpitations, orthopnea, claudication, leg swelling and PND.   Gastrointestinal: Positive for abdominal pain. Negative for heartburn, nausea, vomiting, diarrhea, constipation, blood in stool and melena.  Genitourinary: Negative for dysuria, urgency, frequency, hematuria and flank pain.  Musculoskeletal: Negative for myalgias, back pain, joint pain and falls.  Skin: Negative for itching and rash.  Neurological: Negative for dizziness, tingling, tremors, sensory change, speech change, focal weakness, seizures, loss of consciousness, weakness and headaches.  Endo/Heme/Allergies: Negative for environmental allergies and polydipsia. Does not bruise/bleed easily.  Psychiatric/Behavioral: Negative for depression, suicidal ideas, hallucinations,  memory loss and substance abuse. The patient is not nervous/anxious and does not have insomnia.      PHYSICAL EXAM:  Blood pressure 138/80, pulse 69, temperature 98.2 F (36.8 C), temperature source Oral, resp. rate 16, height 5\' 9"  (1.753 m), weight (!) 360 lb (163.3 kg), last menstrual period 06/04/2014, SpO2 97 %.    Vitals reviewed. Constitutional: She is oriented to person, place, and time. She appears well-developed and well-nourished.  HENT:  Head: Normocephalic and atraumatic.  Right Ear: External ear normal.  Left Ear: External ear normal.  Nose: Nose normal.  Mouth/Throat: Oropharynx is clear and moist.  Eyes: Conjunctivae and EOM are normal. Pupils are equal, round, and reactive to light. Right eye exhibits no discharge. Left eye exhibits no discharge. No scleral icterus.  Neck: Normal range of motion. Neck supple. No tracheal deviation present. No thyromegaly present.  Cardiovascular: Normal rate, regular rhythm, normal heart sounds and intact distal pulses.  Exam reveals no gallop and no friction rub.   No murmur heard. Respiratory: Effort normal and breath sounds normal. No respiratory distress. She has no wheezes. She has no rales. She exhibits no tenderness.  GI: Soft. Bowel sounds are normal. She  exhibits no distension and no mass. There is tenderness. There is no rebound and no guarding.  Genitourinary:       Vulva is normal without lesions Vagina is pink moist without discharge Cervix normal in appearance and pap is normal Uterus is normal size, contour, position, consistency, mobility, non-tender Adnexa is negative with normal sized ovaries by sonogram  Musculoskeletal: Normal range of motion. She exhibits no edema and no tenderness.  Neurological: She is alert and oriented to person, place, and time. She has normal reflexes. She displays normal reflexes. No cranial nerve deficit. She exhibits normal muscle tone. Coordination normal.  Skin: Skin is warm and dry. No rash noted. No erythema. No pallor.  Psychiatric: She has a normal mood and affect. Her behavior is normal. Judgment and thought content normal.    Labs: Results for orders placed or performed during the hospital encounter of 08/07/16 (from the past 336 hour(s))  Glucose, capillary   Collection Time: 08/07/16  9:37 AM  Result Value Ref Range   Glucose-Capillary 91 65 - 99 mg/dL  Results for orders placed or performed during the hospital encounter of 08/01/16 (from the past 336 hour(s))  CBC   Collection Time: 08/01/16 11:11 AM  Result Value Ref Range   WBC 7.3 4.0 - 10.5 K/uL   RBC 4.25 3.87 - 5.11 MIL/uL   Hemoglobin 13.6 12.0 - 15.0 g/dL   HCT 39.9 36.0 - 46.0 %   MCV 93.9 78.0 - 100.0 fL   MCH 32.0 26.0 - 34.0 pg   MCHC 34.1 30.0 - 36.0 g/dL   RDW 12.9 11.5 - 15.5 %   Platelets 253 150 - 400 K/uL  Comprehensive metabolic panel   Collection Time: 08/01/16 11:11 AM  Result Value Ref Range   Sodium 142 135 - 145 mmol/L   Potassium 3.5 3.5 - 5.1 mmol/L   Chloride 109 101 - 111 mmol/L   CO2 26 22 - 32 mmol/L   Glucose, Bld 196 (H) 65 - 99 mg/dL   BUN 18 6 - 20 mg/dL   Creatinine, Ser 1.35 (H) 0.44 - 1.00 mg/dL   Calcium 8.8 (L) 8.9 - 10.3 mg/dL   Total Protein 6.6 6.5 - 8.1 g/dL   Albumin 3.8 3.5 - 5.0  g/dL   AST 22  15 - 41 U/L   ALT 26 14 - 54 U/L   Alkaline Phosphatase 95 38 - 126 U/L   Total Bilirubin 0.7 0.3 - 1.2 mg/dL   GFR calc non Af Amer 46 (L) >60 mL/min   GFR calc Af Amer 53 (L) >60 mL/min   Anion gap 7 5 - 15  hCG, quantitative, pregnancy   Collection Time: 08/01/16 11:11 AM  Result Value Ref Range   hCG, Beta Chain, Quant, S 1 <5 mIU/mL  Urinalysis, Routine w reflex microscopic (not at [facility])   Collection Time: 08/01/16 11:11 AM  Result Value Ref Range   Color, Urine YELLOW YELLOW   APPearance HAZY (A) CLEAR   Specific Gravity, Urine 1.010 1.005 - 1.030   pH 6.5 5.0 - 8.0   Glucose, UA NEGATIVE NEGATIVE mg/dL   Hgb urine dipstick LARGE (A) NEGATIVE   Bilirubin Urine NEGATIVE NEGATIVE   Ketones, ur NEGATIVE NEGATIVE mg/dL   Protein, ur NEGATIVE NEGATIVE mg/dL   Nitrite NEGATIVE NEGATIVE   Leukocytes, UA TRACE (A) NEGATIVE  Urine microscopic-add on   Collection Time: 08/01/16 11:11 AM  Result Value Ref Range   Squamous Epithelial / LPF 0-5 (A) NONE SEEN   WBC, UA 0-5 0 - 5 WBC/hpf   RBC / HPF TOO NUMEROUS TO COUNT 0 - 5 RBC/hpf   Bacteria, UA NONE SEEN NONE SEEN    EKG: Orders placed or performed during the hospital encounter of 06/20/16  . EKG 12-Lead  . EKG 12-Lead  . EKG    Imaging Studies: US Transvaginal Non-ob  Result Date: 07/24/2016 GYNECOLOGIC SONOGRAM Erin Sexton is a 45 y.o. G0P0  Unknown LMP,she is here for a pelvic sonogram for endometrial hyperplasia. Uterus                      11.5 x 7.4 x 8 cm, homogeneous anteverted uterus Endometrium          33.4 mm, irregular, complex, thickened endometrium Right ovary             2.6 x 1.9 x 2.1 cm, wnl Left ovary                2.2 x 1.5 x 2.1 cm, wnl limited view No free fluid,no pain during ultrasound Technician Comments: PELVIC US TA/TV: Homogeneous anteverted uterus wnl,irregular, complex, thickened endometrium,EEC 33.4 mm,normal ov's bilat,limited view of left ov,no free fluid,no pain during  ultrasound [employer] 07/24/2016 2:22 PM Clinical Impression and recommendations: I have reviewed the sonogram results above, combined with the patient's current clinical course, below are my impressions and any appropriate recommendations for management based on the sonographic findings. Uterus is normal, a bit generous in size but no myomata Endometrium is thickened from chronic megestrol therapy Both ovaries are normal EURE,LUTHER H 07/24/2016 2:29 PM   US Pelvis Complete  Result Date: 07/24/2016 GYNECOLOGIC SONOGRAM Erin Sexton is a 45 y.o. G0P0  Unknown LMP,she is here for a pelvic sonogram for endometrial hyperplasia. Uterus                      11.5 x 7.4 x 8 cm, homogeneous anteverted uterus Endometrium          33.4 mm, irregular, complex, thickened endometrium Right ovary             2.6 x 1.9 x 2.1 cm, wnl Left ovary  2.2 x 1.5 x 2.1 cm, wnl limited view No free fluid,no pain during ultrasound Technician Comments: PELVIC US TA/TV: Homogeneous anteverted uterus wnl,irregular, complex, thickened endometrium,EEC 33.4 mm,normal ov's bilat,limited view of left ov,no free fluid,no pain during ultrasound [employer] 07/24/2016 2:22 PM Clinical Impression and recommendations: I have reviewed the sonogram results above, combined with the patient's current clinical course, below are my impressions and any appropriate recommendations for management based on the sonographic findings. Uterus is normal, a bit generous in size but no myomata Endometrium is thickened from chronic megestrol therapy Both ovaries are normal EURE,LUTHER H 07/24/2016 2:29 PM   Mm Screening Breast Tomo Bilateral  Result Date: 07/18/2016 CLINICAL DATA:  Screening. EXAM: 2D DIGITAL SCREENING BILATERAL MAMMOGRAM WITH CAD AND ADJUNCT TOMO COMPARISON:  Previous exam(s). ACR Breast Density Category a: The breast tissue is almost entirely fatty. FINDINGS: There are no findings suspicious for malignancy. Images were  processed with CAD. IMPRESSION: No mammographic evidence of malignancy. A result letter of this screening mammogram will be mailed directly to the patient. RECOMMENDATION: Screening mammogram in one year. (Code:SM-B-01Y) BI-RADS CATEGORY  1: Negative. Electronically Signed   By: Claudie Revering M.D.   On: 07/18/2016 14:07      Assessment: Menometrorrhagia, history of, requiring multiple transfusions in the past, last time 6 units PRBC, stable on megestrol for 3 years  TIA thought to possibly be because of chronic megestrol therapy  Patient Active Problem List   Diagnosis Date Noted  . Facial droop 06/20/2016  . TIA (transient ischemic attack) 06/20/2016  . Chronic diastolic CHF (congestive heart failure) ([place]) 06/20/2016  . Complex endometrial hyperplasia without atypia 06/08/2014  . Menorrhagia 06/01/2014  . Physical deconditioning 05/25/2014  . Catheter-associated urinary tract infection ([place]) 05/20/2014  . Hypokalemia 05/20/2014  . Acute on chronic diastolic congestive heart failure ([place]) 05/18/2014  . Diabetes mellitus type 2, noninsulin dependent ([place])   . OSA (obstructive sleep apnea) 03/21/2014  . Vaginal bleeding 03/06/2014  . Acute blood loss anemia 03/06/2014  . HOCM (hypertrophic obstructive cardiomyopathy) ([place]) 03/06/2014  . Morbid obesity ([place]) 03/02/2014  . Essential hypertension, benign 03/02/2014  . Asthma, chronic 03/02/2014    Plan: Hysteroscopy uterine curettage endometrial ablation  EURE,LUTHER H 08/07/2016 10:32 AM

## 2016-08-07 NOTE — Anesthesia Preprocedure Evaluation (Signed)
Anesthesia Evaluation  Patient identified by MRN, date of birth, ID band Patient awake    Reviewed: Allergy & Precautions, NPO status , Patient's Chart, lab work & pertinent test results  Airway Mallampati: III  TM Distance: >3 FB     Dental  (+) Teeth Intact, Dental Advisory Given   Pulmonary asthma , sleep apnea , COPD,    breath sounds clear to auscultation       Cardiovascular hypertension, +CHF  + dysrhythmias Supra Ventricular Tachycardia  Rhythm:Regular Rate:Normal     Neuro/Psych PSYCHIATRIC DISORDERS Anxiety Depression    GI/Hepatic negative GI ROS,   Endo/Other  diabetes, Type 2, Oral Hypoglycemic AgentsMorbid obesity  Renal/GU      Musculoskeletal   Abdominal   Peds  Hematology  (+) anemia ,   Anesthesia Other Findings   Reproductive/Obstetrics                             Anesthesia Physical Anesthesia Plan  ASA: III  Anesthesia Plan: General   Post-op Pain Management:    Induction: Intravenous, Rapid sequence and Cricoid pressure planned  Airway Management Planned: Oral ETT  Additional Equipment:   Intra-op Plan:   Post-operative Plan: Extubation in OR  Informed Consent: I have reviewed the patients History and Physical, chart, labs and discussed the procedure including the risks, benefits and alternatives for the proposed anesthesia with the patient or authorized representative who has indicated his/her understanding and acceptance.     Plan Discussed with:   Anesthesia Plan Comments:         Anesthesia Quick Evaluation

## 2016-08-07 NOTE — Progress Notes (Signed)
Order placed by Dr Elonda Husky for Depo-provera injection in PACU, however pharmacy does not carry medication.  Dr Elonda Husky notified and will call in injection to her pharmacy for her to bring with her at follow up appointment and will be given then.

## 2016-08-08 ENCOUNTER — Telehealth: Payer: Self-pay | Admitting: *Deleted

## 2016-08-08 ENCOUNTER — Encounter (HOSPITAL_COMMUNITY): Payer: Self-pay | Admitting: Obstetrics & Gynecology

## 2016-08-09 ENCOUNTER — Other Ambulatory Visit: Payer: Self-pay | Admitting: Obstetrics & Gynecology

## 2016-08-09 MED ORDER — MEDROXYPROGESTERONE ACETATE 150 MG/ML IM SUSP: INTRAMUSCULAR | 0 refills | Status: DC

## 2016-08-14 ENCOUNTER — Encounter: Payer: Self-pay | Admitting: Obstetrics & Gynecology

## 2016-08-14 ENCOUNTER — Ambulatory Visit (INDEPENDENT_AMBULATORY_CARE_PROVIDER_SITE_OTHER): Payer: Medicare Other | Admitting: Obstetrics & Gynecology

## 2016-08-14 VITALS — BP 140/70 | HR 76 | Wt 356.0 lb

## 2016-08-14 DIAGNOSIS — Z9889 Other specified postprocedural states: Secondary | ICD-10-CM | POA: Diagnosis not present

## 2016-08-14 DIAGNOSIS — N8502 Endometrial intraepithelial neoplasia [EIN]: Secondary | ICD-10-CM | POA: Diagnosis not present

## 2016-08-14 NOTE — Progress Notes (Signed)
  HPI: Patient returns for routine postoperative follow-up having undergone hysteroscopy uterine curettage failed endometrial ablation placement of Mirena IUD on 08/07/16.  The patient's immediate postoperative recovery has been unremarkable. Since hospital discharge the patient reports spotting mild.   Current Outpatient Prescriptions: aspirin EC 81 MG EC tablet, Take 1 tablet (81 mg total) by mouth daily., Disp: , Rfl:  atorvastatin (LIPITOR) 80 MG tablet, TK 1 T PO D, Disp: , Rfl: 3 buPROPion (WELLBUTRIN SR) 150 MG 12 hr tablet, TAKE ONE TABLET BY MOUTH TWICE DAILY, Disp: , Rfl: 2 Cholecalciferol (VITAMIN D PO), Take 1 capsule by mouth daily., Disp: , Rfl:  fluticasone (FLOVENT HFA) 110 MCG/ACT inhaler, Inhale 2 puffs into the lungs 2 (two) times daily., Disp: 1 Inhaler, Rfl: 12 glipiZIDE (GLUCOTROL) 5 MG tablet, Take 5 mg by mouth 2 (two) times daily before a meal. , Disp: , Rfl:  levothyroxine (SYNTHROID, LEVOTHROID) 150 MCG tablet, Take 150 mcg by mouth daily before breakfast. , Disp: , Rfl:  metFORMIN (GLUCOPHAGE) 500 MG tablet, Take 1 tablet (500 mg total) by mouth 2 (two) times daily with a meal., Disp: 60 tablet, Rfl: 0 metoprolol (LOPRESSOR) 50 MG tablet, Take 50 mg by mouth 2 (two) times daily. , Disp: , Rfl:  ondansetron (ZOFRAN) 8 MG tablet, Take 1 tablet (8 mg total) by mouth every 8 (eight) hours as needed for nausea., Disp: 12 tablet, Rfl: 0 potassium chloride SA (K-DUR,KLOR-CON) 20 MEQ tablet, Take 20 mEq by mouth 2 (two) times daily. , Disp: , Rfl:  torsemide (DEMADEX) 20 MG tablet, Take 1 tablet (20 mg total) by mouth daily. (Patient taking differently: Take 20 mg by mouth 2 (two) times daily. ), Disp: 90 tablet, Rfl: 3  Current Facility-Administered Medications: medroxyPROGESTERone (DEPO-PROVERA) injection 300 mg, 300 mg, Intramuscular, Once, Florian Buff, MD     Blood pressure 140/70, pulse 76, weight (!) 356 lb (161.5 kg), last menstrual period 06/04/2014.  Physical  Exam: Normal post D&C  Diagnostic Tests:   Pathology: Complex hyperplasia  With atypia(known)  Impression: S/P hysteroscopy uterine curettage Mirena IUD  Plan: Depo prover 300 mg IM today Mirena IUD in place  Follow up: 3  months  Florian Buff, MD

## 2016-08-29 DIAGNOSIS — E119 Type 2 diabetes mellitus without complications: Secondary | ICD-10-CM | POA: Diagnosis not present

## 2016-08-29 DIAGNOSIS — Z6841 Body Mass Index (BMI) 40.0 and over, adult: Secondary | ICD-10-CM | POA: Diagnosis not present

## 2016-09-11 DIAGNOSIS — Z23 Encounter for immunization: Secondary | ICD-10-CM | POA: Diagnosis not present

## 2016-10-15 DIAGNOSIS — E785 Hyperlipidemia, unspecified: Secondary | ICD-10-CM | POA: Diagnosis not present

## 2016-10-15 DIAGNOSIS — E559 Vitamin D deficiency, unspecified: Secondary | ICD-10-CM | POA: Diagnosis not present

## 2016-10-15 DIAGNOSIS — E119 Type 2 diabetes mellitus without complications: Secondary | ICD-10-CM | POA: Diagnosis not present

## 2016-10-15 DIAGNOSIS — I5022 Chronic systolic (congestive) heart failure: Secondary | ICD-10-CM | POA: Diagnosis not present

## 2016-10-15 DIAGNOSIS — E039 Hypothyroidism, unspecified: Secondary | ICD-10-CM | POA: Diagnosis not present

## 2016-10-21 DIAGNOSIS — E119 Type 2 diabetes mellitus without complications: Secondary | ICD-10-CM | POA: Diagnosis not present

## 2016-10-21 DIAGNOSIS — Z6841 Body Mass Index (BMI) 40.0 and over, adult: Secondary | ICD-10-CM | POA: Diagnosis not present

## 2016-11-07 ENCOUNTER — Emergency Department (HOSPITAL_COMMUNITY)
Admission: EM | Admit: 2016-11-07 | Discharge: 2016-11-07 | Disposition: A | Discharge status: Home / Self Care | Payer: Medicare Other | Attending: Emergency Medicine | Admitting: Emergency Medicine

## 2016-11-07 ENCOUNTER — Encounter (HOSPITAL_COMMUNITY): Payer: Self-pay | Admitting: Emergency Medicine

## 2016-11-07 ENCOUNTER — Emergency Department (HOSPITAL_COMMUNITY): Payer: Medicare Other

## 2016-11-07 DIAGNOSIS — E119 Type 2 diabetes mellitus without complications: Secondary | ICD-10-CM | POA: Diagnosis not present

## 2016-11-07 DIAGNOSIS — Z7982 Long term (current) use of aspirin: Secondary | ICD-10-CM | POA: Insufficient documentation

## 2016-11-07 DIAGNOSIS — Z79899 Other long term (current) drug therapy: Secondary | ICD-10-CM | POA: Insufficient documentation

## 2016-11-07 DIAGNOSIS — I110 Hypertensive heart disease with heart failure: Secondary | ICD-10-CM | POA: Diagnosis not present

## 2016-11-07 DIAGNOSIS — J449 Chronic obstructive pulmonary disease, unspecified: Secondary | ICD-10-CM | POA: Insufficient documentation

## 2016-11-07 DIAGNOSIS — Z791 Long term (current) use of non-steroidal anti-inflammatories (NSAID): Secondary | ICD-10-CM | POA: Insufficient documentation

## 2016-11-07 DIAGNOSIS — M79671 Pain in right foot: Secondary | ICD-10-CM | POA: Diagnosis not present

## 2016-11-07 DIAGNOSIS — Z7984 Long term (current) use of oral hypoglycemic drugs: Secondary | ICD-10-CM | POA: Insufficient documentation

## 2016-11-07 DIAGNOSIS — J45909 Unspecified asthma, uncomplicated: Secondary | ICD-10-CM | POA: Insufficient documentation

## 2016-11-07 DIAGNOSIS — I5032 Chronic diastolic (congestive) heart failure: Secondary | ICD-10-CM | POA: Diagnosis not present

## 2016-11-07 DIAGNOSIS — M7731 Calcaneal spur, right foot: Secondary | ICD-10-CM | POA: Diagnosis not present

## 2016-11-07 MED ORDER — DICLOFENAC SODIUM 3 % TD GEL: 1.0000 "application " | Freq: Two times a day (BID) | TRANSDERMAL | 0 refills | Status: DC

## 2016-11-07 MED ORDER — ACETAMINOPHEN 325 MG PO TABS: 650.0000 mg | ORAL_TABLET | Freq: Four times a day (QID) | ORAL | 0 refills | Status: DC | PRN

## 2016-11-07 NOTE — ED Triage Notes (Signed)
Right foot pain x 2 weeks, worse in heel, and worse in morning

## 2016-11-07 NOTE — ED Notes (Signed)
Patient given discharge instruction, verbalized understand. Patient ambulatory out of the department.  

## 2016-11-07 NOTE — ED Provider Notes (Signed)
[facility] DEPT Provider Note   CSN: [id number] Arrival date & time: 11/07/16  1052     History   Chief Complaint Chief Complaint  Patient presents with  . Foot Pain    HPI Erin Sexton is a 45 y.o. female.  Erin Sexton is a 45 y.o. Female who is obese who presents to the emergency department complaining of right heel pain over the past 2 weeks that has worsened. She reports pain is worse with weightbearing and touching the area. She denies any injury or trauma to her foot or ankle. No treatments prior to arrival today. Patient denies fevers, numbness, tingling, weakness or injury to her foot or ankle.   The history is provided by the patient. No language interpreter was used.  Foot Pain     Past Medical History:  Diagnosis Date  . Abnormal CT scan    a. 02/2014: evaluated for dyspnea and initially placed on Xarelto for PE because radiologist could not exclude small peripheral pulmonary emboli because of limited contrast. However, after admission for ++vaginal bleeding several days later, it was felt she did NOT have PE - underwent further testing with neg VQ 03/06/14, neg LE duplex.  . Anemia   . Anxiety   . Asthma   . Blood transfusion without reported diagnosis   . CHF (congestive heart failure) ([place])   . COPD (chronic obstructive pulmonary disease) ([place])   . Depression   . Diabetes mellitus without complication ([place])    a. Dx 02/2014.  [name] History of echocardiogram 2015   normal EF, Grade II diastolic dysfunction  . HOCM (hypertrophic obstructive cardiomyopathy) ([place])    a. Dx [id number].  [name] Hypertension   . Morbid obesity ([place])   . NSVT (nonsustained ventricular tachycardia) ([place])    a. Noted on tele during 02/2014 adm for symptomatic anemia.  [name] SVT (supraventricular tachycardia) ([place])    a. Noted on tele during 02/2014 adm for symptomatic anemia.  . Vaginal bleeding     Patient Active Problem List   Diagnosis Date Noted  . Facial droop 06/20/2016  .  TIA (transient ischemic attack) 06/20/2016  . Chronic diastolic CHF (congestive heart failure) ([place]) 06/20/2016  . Complex endometrial hyperplasia without atypia 06/08/2014  . Menorrhagia 06/01/2014  . Physical deconditioning 05/25/2014  . Catheter-associated urinary tract infection ([place]) 05/20/2014  . Hypokalemia 05/20/2014  . Acute on chronic diastolic congestive heart failure ([place]) 05/18/2014  . Diabetes mellitus type 2, noninsulin dependent ([place])   . OSA (obstructive sleep apnea) 03/21/2014  . Vaginal bleeding 03/06/2014  . Acute blood loss anemia 03/06/2014  . HOCM (hypertrophic obstructive cardiomyopathy) ([place]) 03/06/2014  . Morbid obesity ([place]) 03/02/2014  . Essential hypertension, benign 03/02/2014  . Asthma, chronic 03/02/2014    Past Surgical History:  Procedure Laterality Date  . DILITATION & CURRETTAGE/HYSTROSCOPY WITH NOVASURE ABLATION N/A 08/07/2016   Procedure: DILATATION & CURETTAGE/HYSTEROSCOPY WITH ATTPEMPTED NOVASURE ABLATION AND INSERTION OF IUD;  Surgeon: Florian Buff, MD;  Location: [facility];  Service: Gynecology;  Laterality: N/A;    OB History    Gravida Para Term Preterm AB Living             0   SAB TAB Ectopic Multiple Live Births                   Home Medications    Prior to Admission medications   Medication Sig Start Date End Date Taking? Authorizing Provider  acetaminophen (TYLENOL) 325 MG tablet Take  2 tablets (650 mg total) by mouth every 6 (six) hours as needed for mild pain or moderate pain. 11/07/16   Waynetta Pean, PA-C  aspirin EC 81 MG EC tablet Take 1 tablet (81 mg total) by mouth daily. 06/21/16   Erline Hau, MD  atorvastatin (LIPITOR) 80 MG tablet TK 1 T PO D 01/25/16   Historical Provider, MD  buPROPion (WELLBUTRIN SR) 150 MG 12 hr tablet TAKE ONE TABLET BY MOUTH TWICE DAILY 06/01/15   Historical Provider, MD  Cholecalciferol (VITAMIN D PO) Take 1 capsule by mouth daily.    Historical Provider, MD  Diclofenac Sodium 3 %  GEL Place 1 application onto the skin 2 (two) times daily. To affected area. 11/07/16   Waynetta Pean, PA-C  fluticasone (FLOVENT HFA) 110 MCG/ACT inhaler Inhale 2 puffs into the lungs 2 (two) times daily. 03/03/14   Kathie Dike, MD  glipiZIDE (GLUCOTROL) 5 MG tablet Take 5 mg by mouth 2 (two) times daily before a meal.     Historical Provider, MD  levothyroxine (SYNTHROID, LEVOTHROID) 150 MCG tablet Take 150 mcg by mouth daily before breakfast.  04/10/16   Historical Provider, MD  metFORMIN (GLUCOPHAGE) 500 MG tablet Take 1 tablet (500 mg total) by mouth 2 (two) times daily with a meal. 05/27/14   Lavon Paganini Angiulli, PA-C  metoprolol (LOPRESSOR) 50 MG tablet Take 50 mg by mouth 2 (two) times daily.  11/16/14   Historical Provider, MD  ondansetron (ZOFRAN) 8 MG tablet Take 1 tablet (8 mg total) by mouth every 8 (eight) hours as needed for nausea. 08/07/16   Florian Buff, MD  potassium chloride SA (K-DUR,KLOR-CON) 20 MEQ tablet Take 20 mEq by mouth 2 (two) times daily.     Historical Provider, MD  torsemide (DEMADEX) 20 MG tablet Take 1 tablet (20 mg total) by mouth daily. Patient taking differently: Take 20 mg by mouth 2 (two) times daily.  05/01/15   Herminio Commons, MD    Family History Family History  Problem Relation Age of Onset  . Diabetes Mother   . Arthritis Mother   . Other Mother     WPW  . Uterine cancer Maternal Grandmother   . CAD Neg Hx     No family history of EARLY CAD    Social History Social History  Substance Use Topics  . Smoking status: Never Smoker  . Smokeless tobacco: Never Used  . Alcohol use No     Allergies   Patient has no known allergies.   Review of Systems Review of Systems  Constitutional: Negative for fever.  Cardiovascular: Negative for leg swelling.  Musculoskeletal: Positive for arthralgias.  Skin: Negative for color change and wound.  Neurological: Negative for weakness and numbness.     Physical Exam Updated Vital Signs BP 125/60  (BP Location: Right Arm)   Pulse 65   Temp 98 F (36.7 C) (Oral)   Resp 20   Ht 5\' 9"  (1.753 m)   Wt (!) 161 kg   LMP 06/04/2014   BMI 52.42 kg/m   Physical Exam  Constitutional: She appears well-developed and well-nourished. No distress.  Nontoxic appearing. Obese  HENT:  Head: Normocephalic and atraumatic.  Eyes: Right eye exhibits no discharge. Left eye exhibits no discharge.  Cardiovascular: Normal rate, regular rhythm and intact distal pulses.   Bilateral dorsalis pedis and posterior tibialis pulses are intact.   Pulmonary/Chest: Effort normal. No respiratory distress.  Musculoskeletal: Normal range of motion. She exhibits tenderness.  She exhibits no edema or deformity.  Tenderness to the plantar aspect of her right heel. No ankle pain or instability noted. No calf edema or tenderness noted. No deformity noted.  Neurological: She is alert. Coordination normal.  Sensation is intact in her bilateral distal toes.  Skin: Skin is warm and dry. Capillary refill takes less than 2 seconds. No rash noted. She is not diaphoretic. No erythema. No pallor.  Psychiatric: She has a normal mood and affect. Her behavior is normal.  Nursing note and vitals reviewed.    ED Treatments / Results  Labs (all labs ordered are listed, but only abnormal results are displayed) Labs Reviewed - No data to display  EKG  EKG Interpretation None       Radiology Dg Foot Complete Right  Result Date: 11/07/2016 CLINICAL DATA:  Right hindfoot pain and heel pain for 2 weeks. No known injury. EXAM: RIGHT FOOT COMPLETE - 3+ VIEW COMPARISON:  None. FINDINGS: There is no evidence of fracture or dislocation. Prominent plantar and dorsal calcaneal bone spurs noted. Mild degenerative spurring is also seen involving the talonavicular joint and ankle joint. Enthesopathic changes are seen involving cuboid, base of fifth metatarsal, and navicular bones. IMPRESSION: No acute findings. Prominent plantar and dorsal  calcaneal bone spurs. Mild degenerative changes. Electronically Signed   By: Earle Gell M.D.   On: 11/07/2016 12:01    Procedures Procedures (including critical care time)  Medications Ordered in ED Medications - No data to display   Initial Impression / Assessment and Plan / ED Course  I have reviewed the triage vital signs and the nursing notes.  Pertinent labs & imaging results that were available during my care of the patient were reviewed by me and considered in my medical decision making (see chart for details).  Clinical Course    This is a 45 y.o. Female who is obese who presents to the emergency department complaining of right heel pain over the past 2 weeks that has worsened. She reports pain is worse with weightbearing and touching the area. She denies any injury or trauma to her foot or ankle.  On exam patient is afebrile nontoxic appearing. She is mildly obese. She some tenderness to the plantar aspect of her right heel. No deformity or ecchymosis noted. She is neurovascularly intact. X-ray shows calcaneal bone spur. This is likely the cause of her pain. I encouraged her to use diclofenac gel and tylenol as patient's creatinine has been elevated previously and I would not start her on oral NSAIDs. We'll have her follow up with primary care and podiatry. I advised the patient to follow-up with their primary care provider this week. I advised the patient to return to the emergency department with new or worsening symptoms or new concerns. The patient verbalized understanding and agreement with plan.     Final Clinical Impressions(s) / ED Diagnoses   Final diagnoses:  Calcaneal spur of right foot    New Prescriptions New Prescriptions   ACETAMINOPHEN (TYLENOL) 325 MG TABLET    Take 2 tablets (650 mg total) by mouth every 6 (six) hours as needed for mild pain or moderate pain.   DICLOFENAC SODIUM 3 % GEL    Place 1 application onto the skin 2 (two) times daily. To affected  area.     Waynetta Pean, PA-C 11/07/16 1324    Fredia Sorrow, MD 11/08/16 [phone]

## 2016-11-13 ENCOUNTER — Telehealth: Payer: Self-pay | Admitting: *Deleted

## 2016-11-13 NOTE — Telephone Encounter (Signed)
Patient called stating she has appt with Dr Elonda Husky but is having vaginal bleeding and wants to make sure it was still ok to be seen. Informed patient the visit is a follow-up and bleeding can be normal with an IUD and she still may be seen. Pt verbalized understanding.

## 2016-11-14 ENCOUNTER — Encounter: Payer: Self-pay | Admitting: Obstetrics & Gynecology

## 2016-11-14 ENCOUNTER — Ambulatory Visit (INDEPENDENT_AMBULATORY_CARE_PROVIDER_SITE_OTHER): Payer: Medicare Other | Admitting: Obstetrics & Gynecology

## 2016-11-14 VITALS — BP 120/80 | HR 78 | Wt 361.2 lb

## 2016-11-14 DIAGNOSIS — N8502 Endometrial intraepithelial neoplasia [EIN]: Secondary | ICD-10-CM | POA: Diagnosis not present

## 2016-11-14 DIAGNOSIS — Z975 Presence of (intrauterine) contraceptive device: Secondary | ICD-10-CM | POA: Diagnosis not present

## 2016-11-14 DIAGNOSIS — Z6841 Body Mass Index (BMI) 40.0 and over, adult: Secondary | ICD-10-CM

## 2016-11-14 NOTE — Progress Notes (Signed)
Chief Complaint  Patient presents with  . 3 month follow-up    has IUD    Blood pressure 120/80, pulse 78, weight (!) 361 lb 3.2 oz (163.8 kg), last menstrual period 06/04/2014.  45 y.o. G0P0 Patient's last menstrual period was 06/04/2014. The current method of family planning is IUD.  Outpatient Encounter Prescriptions as of 11/14/2016  Medication Sig  . aspirin EC 81 MG EC tablet Take 1 tablet (81 mg total) by mouth daily.  [name] atorvastatin (LIPITOR) 80 MG tablet TK 1 T PO D  . buPROPion (WELLBUTRIN SR) 150 MG 12 hr tablet TAKE ONE TABLET BY MOUTH TWICE DAILY  . Cholecalciferol (VITAMIN D PO) Take 1 capsule by mouth daily.  . Diclofenac Sodium 3 % GEL Place 1 application onto the skin 2 (two) times daily. To affected area.  [name] glipiZIDE (GLUCOTROL) 5 MG tablet Take 5 mg by mouth 2 (two) times daily before a meal.   . levothyroxine (SYNTHROID, LEVOTHROID) 150 MCG tablet Take 150 mcg by mouth daily before breakfast.   . metFORMIN (GLUCOPHAGE) 500 MG tablet Take 1 tablet (500 mg total) by mouth 2 (two) times daily with a meal.  . metoprolol (LOPRESSOR) 50 MG tablet Take 50 mg by mouth 2 (two) times daily.   . potassium chloride SA (K-DUR,KLOR-CON) 20 MEQ tablet Take 20 mEq by mouth 2 (two) times daily.   [name] torsemide (DEMADEX) 20 MG tablet Take 1 tablet (20 mg total) by mouth daily. (Patient taking differently: Take 20 mg by mouth 2 (two) times daily. )  . acetaminophen (TYLENOL) 325 MG tablet Take 2 tablets (650 mg total) by mouth every 6 (six) hours as needed for mild pain or moderate pain. (Patient not taking: Reported on 11/14/2016)  . [DISCONTINUED] fluticasone (FLOVENT HFA) 110 MCG/ACT inhaler Inhale 2 puffs into the lungs 2 (two) times daily.  . [DISCONTINUED] ondansetron (ZOFRAN) 8 MG tablet Take 1 tablet (8 mg total) by mouth every 8 (eight) hours as needed for nausea.   Facility-Administered Encounter Medications as of 11/14/2016  Medication  . medroxyPROGESTERone  (DEPO-PROVERA) injection 300 mg    Subjective Pt is s/p hysteroscopy uterine curettage failed endometrial ablation found to have complex hyperplasia with atypia IUD in place Could not do the ablation because no adequate seal could be obtained despite great effort On the IUD her bleeding is like a light period but very manageable  Objective   Pertinent ROS   Labs or studies     Impression Diagnoses this Encounter::   ICD-9-CM ICD-10-CM   1. Complex endometrial hyperplasia with atypia 621.33 N85.02     Established relevant diagnosis(es):   Plan/Recommendations: No orders of the defined types were placed in this encounter.   Labs or Scans Ordered: No orders of the defined types were placed in this encounter.   Management:: IUD in place continue with that and hope adequately suppresses her endometrium  Follow up Return in about 1 year (around 11/14/2017) for yearly, with Dr Elonda Husky.        Face to face time:  10 minutes  Greater than 50% of the visit time was spent in counseling and coordination of care with the patient.  The summary and outline of the counseling and care coordination is summarized in the note above.   All questions were answered.  Past Medical History:  Diagnosis Date  . Abnormal CT scan    a. 02/2014: evaluated for dyspnea and initially placed on Xarelto for PE because  radiologist could not exclude small peripheral pulmonary emboli because of limited contrast. However, after admission for ++vaginal bleeding several days later, it was felt she did NOT have PE - underwent further testing with neg VQ 03/06/14, neg LE duplex.  . Anemia   . Anxiety   . Asthma   . Blood transfusion without reported diagnosis   . Bone spur of right foot   . CHF (congestive heart failure) ([place])   . COPD (chronic obstructive pulmonary disease) ([place])   . Depression   . Diabetes mellitus without complication ([place])    a. Dx 02/2014.  [name] History of echocardiogram 2015    normal EF, Grade II diastolic dysfunction  . HOCM (hypertrophic obstructive cardiomyopathy) ([place])    a. Dx [id number].  [name] Hypertension   . Morbid obesity ([place])   . NSVT (nonsustained ventricular tachycardia) ([place])    a. Noted on tele during 02/2014 adm for symptomatic anemia.  [name] SVT (supraventricular tachycardia) ([place])    a. Noted on tele during 02/2014 adm for symptomatic anemia.  . Vaginal bleeding     Past Surgical History:  Procedure Laterality Date  . DILITATION & CURRETTAGE/HYSTROSCOPY WITH NOVASURE ABLATION N/A 08/07/2016   Procedure: DILATATION & CURETTAGE/HYSTEROSCOPY WITH ATTPEMPTED NOVASURE ABLATION AND INSERTION OF IUD;  Surgeon: Florian Buff, MD;  Location: [facility];  Service: Gynecology;  Laterality: N/A;    OB History    Gravida Para Term Preterm AB Living             0   SAB TAB Ectopic Multiple Live Births                  No Known Allergies  Social History   Social History  . Marital status: Divorced    Spouse name: N/A  . Number of children: N/A  . Years of education: N/A   Social History Main Topics  . Smoking status: Never Smoker  . Smokeless tobacco: Never Used  . Alcohol use No  . Drug use: No  . Sexual activity: Not Currently    Birth control/ protection: Pill   Other Topics Concern  . None   Social History Narrative  . None    Family History  Problem Relation Age of Onset  . Diabetes Mother   . Arthritis Mother   . Other Mother     WPW  . Uterine cancer Maternal Grandmother   . CAD Neg Hx     No family history of EARLY CAD

## 2016-11-20 DIAGNOSIS — E119 Type 2 diabetes mellitus without complications: Secondary | ICD-10-CM | POA: Diagnosis not present

## 2016-11-20 DIAGNOSIS — H52203 Unspecified astigmatism, bilateral: Secondary | ICD-10-CM | POA: Diagnosis not present

## 2016-11-20 DIAGNOSIS — H524 Presbyopia: Secondary | ICD-10-CM | POA: Diagnosis not present

## 2016-11-20 DIAGNOSIS — H5203 Hypermetropia, bilateral: Secondary | ICD-10-CM | POA: Diagnosis not present

## 2016-11-21 DIAGNOSIS — M79671 Pain in right foot: Secondary | ICD-10-CM | POA: Diagnosis not present

## 2016-11-21 DIAGNOSIS — Z6841 Body Mass Index (BMI) 40.0 and over, adult: Secondary | ICD-10-CM | POA: Diagnosis not present

## 2016-11-21 DIAGNOSIS — E119 Type 2 diabetes mellitus without complications: Secondary | ICD-10-CM | POA: Diagnosis not present

## 2016-12-14 DIAGNOSIS — S99912A Unspecified injury of left ankle, initial encounter: Secondary | ICD-10-CM | POA: Diagnosis not present

## 2016-12-14 DIAGNOSIS — S50311A Abrasion of right elbow, initial encounter: Secondary | ICD-10-CM | POA: Diagnosis not present

## 2016-12-14 DIAGNOSIS — S59901A Unspecified injury of right elbow, initial encounter: Secondary | ICD-10-CM | POA: Diagnosis not present

## 2016-12-14 DIAGNOSIS — S93402A Sprain of unspecified ligament of left ankle, initial encounter: Secondary | ICD-10-CM | POA: Diagnosis not present

## 2016-12-30 DIAGNOSIS — Z6841 Body Mass Index (BMI) 40.0 and over, adult: Secondary | ICD-10-CM | POA: Diagnosis not present

## 2016-12-30 DIAGNOSIS — E119 Type 2 diabetes mellitus without complications: Secondary | ICD-10-CM | POA: Diagnosis not present

## 2017-01-30 DIAGNOSIS — I5022 Chronic systolic (congestive) heart failure: Secondary | ICD-10-CM | POA: Diagnosis not present

## 2017-01-30 DIAGNOSIS — E119 Type 2 diabetes mellitus without complications: Secondary | ICD-10-CM | POA: Diagnosis not present

## 2017-01-30 DIAGNOSIS — E559 Vitamin D deficiency, unspecified: Secondary | ICD-10-CM | POA: Diagnosis not present

## 2017-01-30 DIAGNOSIS — E039 Hypothyroidism, unspecified: Secondary | ICD-10-CM | POA: Diagnosis not present

## 2017-01-30 DIAGNOSIS — E785 Hyperlipidemia, unspecified: Secondary | ICD-10-CM | POA: Diagnosis not present

## 2017-02-22 ENCOUNTER — Emergency Department (HOSPITAL_COMMUNITY)
Admission: EM | Admit: 2017-02-22 | Discharge: 2017-02-23 | Disposition: A | Discharge status: Home / Self Care | Payer: Medicare Other | Attending: Emergency Medicine | Admitting: Emergency Medicine

## 2017-02-22 ENCOUNTER — Emergency Department (HOSPITAL_COMMUNITY): Payer: Medicare Other

## 2017-02-22 ENCOUNTER — Encounter (HOSPITAL_COMMUNITY): Payer: Self-pay

## 2017-02-22 DIAGNOSIS — Z7982 Long term (current) use of aspirin: Secondary | ICD-10-CM | POA: Diagnosis not present

## 2017-02-22 DIAGNOSIS — J189 Pneumonia, unspecified organism: Secondary | ICD-10-CM | POA: Diagnosis not present

## 2017-02-22 DIAGNOSIS — R079 Chest pain, unspecified: Secondary | ICD-10-CM | POA: Diagnosis not present

## 2017-02-22 DIAGNOSIS — Z79899 Other long term (current) drug therapy: Secondary | ICD-10-CM | POA: Insufficient documentation

## 2017-02-22 DIAGNOSIS — Z7984 Long term (current) use of oral hypoglycemic drugs: Secondary | ICD-10-CM | POA: Insufficient documentation

## 2017-02-22 DIAGNOSIS — I5032 Chronic diastolic (congestive) heart failure: Secondary | ICD-10-CM | POA: Diagnosis not present

## 2017-02-22 DIAGNOSIS — J449 Chronic obstructive pulmonary disease, unspecified: Secondary | ICD-10-CM | POA: Diagnosis not present

## 2017-02-22 DIAGNOSIS — R0789 Other chest pain: Secondary | ICD-10-CM | POA: Diagnosis present

## 2017-02-22 DIAGNOSIS — R05 Cough: Secondary | ICD-10-CM | POA: Diagnosis not present

## 2017-02-22 DIAGNOSIS — I110 Hypertensive heart disease with heart failure: Secondary | ICD-10-CM | POA: Insufficient documentation

## 2017-02-22 DIAGNOSIS — J181 Lobar pneumonia, unspecified organism: Secondary | ICD-10-CM | POA: Insufficient documentation

## 2017-02-22 DIAGNOSIS — J45909 Unspecified asthma, uncomplicated: Secondary | ICD-10-CM | POA: Insufficient documentation

## 2017-02-22 DIAGNOSIS — I10 Essential (primary) hypertension: Secondary | ICD-10-CM | POA: Diagnosis not present

## 2017-02-22 DIAGNOSIS — R0602 Shortness of breath: Secondary | ICD-10-CM | POA: Diagnosis not present

## 2017-02-22 LAB — COMPREHENSIVE METABOLIC PANEL
ALT: 33 U/L (ref 14–54)
AST: 41 U/L (ref 15–41)
Albumin: 3.8 g/dL (ref 3.5–5.0)
Alkaline Phosphatase: 138 U/L — ABNORMAL HIGH (ref 38–126)
Anion gap: 9 (ref 5–15)
BUN: 22 mg/dL — ABNORMAL HIGH (ref 6–20)
CHLORIDE: 100 mmol/L — AB (ref 101–111)
CO2: 26 mmol/L (ref 22–32)
Calcium: 8.4 mg/dL — ABNORMAL LOW (ref 8.9–10.3)
Creatinine, Ser: 1.3 mg/dL — ABNORMAL HIGH (ref 0.44–1.00)
GFR calc non Af Amer: 48 mL/min — ABNORMAL LOW (ref >60)
GFR, EST AFRICAN AMERICAN: 55 mL/min — AB (ref >60)
Glucose, Bld: 242 mg/dL — ABNORMAL HIGH (ref 65–99)
POTASSIUM: 3.5 mmol/L (ref 3.5–5.1)
Sodium: 135 mmol/L (ref 135–145)
Total Bilirubin: 0.7 mg/dL (ref 0.3–1.2)
Total Protein: 6.9 g/dL (ref 6.5–8.1)

## 2017-02-22 LAB — CBC WITH DIFFERENTIAL/PLATELET
BASOS ABS: 0 10*3/uL (ref 0.0–0.1)
Basophils Relative: 0 %
EOS PCT: 1 %
Eosinophils Absolute: 0.1 10*3/uL (ref 0.0–0.7)
HCT: 39 % (ref 36.0–46.0)
Hemoglobin: 13.6 g/dL (ref 12.0–15.0)
LYMPHS ABS: 1.5 10*3/uL (ref 0.7–4.0)
LYMPHS PCT: 23 %
MCH: 31 pg (ref 26.0–34.0)
MCHC: 34.9 g/dL (ref 30.0–36.0)
MCV: 88.8 fL (ref 78.0–100.0)
MONO ABS: 0.4 10*3/uL (ref 0.1–1.0)
Monocytes Relative: 6 %
Neutro Abs: 4.5 10*3/uL (ref 1.7–7.7)
Neutrophils Relative %: 70 %
PLATELETS: 220 10*3/uL (ref 150–400)
RBC: 4.39 MIL/uL (ref 3.87–5.11)
RDW: 13.2 % (ref 11.5–15.5)
WBC: 6.5 10*3/uL (ref 4.0–10.5)

## 2017-02-22 LAB — TROPONIN I

## 2017-02-22 LAB — BRAIN NATRIURETIC PEPTIDE: B NATRIURETIC PEPTIDE 5: 23 pg/mL (ref 0.0–100.0)

## 2017-02-22 MED ORDER — ALBUTEROL SULFATE (2.5 MG/3ML) 0.083% IN NEBU
5.0000 mg | INHALATION_SOLUTION | Freq: Once | RESPIRATORY_TRACT | Status: AC
  Administered 2017-02-22: 5 mg via RESPIRATORY_TRACT
  Filled 2017-02-22: qty 6

## 2017-02-22 MED ORDER — METHYLPREDNISOLONE SODIUM SUCC 125 MG IJ SOLR
125.0000 mg | Freq: Once | INTRAMUSCULAR | Status: AC
  Administered 2017-02-22: 125 mg via INTRAVENOUS
  Filled 2017-02-22: qty 2

## 2017-02-22 MED ORDER — LEVOFLOXACIN 750 MG PO TABS: 750.0000 mg | ORAL_TABLET | Freq: Every day | ORAL | 0 refills | Status: DC

## 2017-02-22 MED ORDER — LEVOFLOXACIN IN D5W 750 MG/150ML IV SOLN
750.0000 mg | Freq: Once | INTRAVENOUS | Status: AC
  Administered 2017-02-22: 750 mg via INTRAVENOUS
  Filled 2017-02-22: qty 150

## 2017-02-22 MED ORDER — PREDNISONE 20 MG PO TABS: ORAL_TABLET | ORAL | 0 refills | Status: DC

## 2017-02-22 MED ORDER — ALBUTEROL SULFATE (5 MG/ML) 0.5% IN NEBU: 2.5000 mg | INHALATION_SOLUTION | Freq: Four times a day (QID) | RESPIRATORY_TRACT | 0 refills | Status: DC | PRN

## 2017-02-22 MED ORDER — SODIUM CHLORIDE 0.9 % IV BOLUS (SEPSIS)
500.0000 mL | Freq: Once | INTRAVENOUS | Status: AC
  Administered 2017-02-22: 500 mL via INTRAVENOUS

## 2017-02-22 MED ORDER — ACETAMINOPHEN 325 MG PO TABS: 650.0000 mg | ORAL_TABLET | ORAL | Status: DC | PRN

## 2017-02-22 NOTE — ED Triage Notes (Signed)
Patient reports of chest pain and shortness of breath today while watching tv. States she has had cough for several days. Chest is tender when palpated.

## 2017-02-22 NOTE — ED Provider Notes (Signed)
[facility] DEPT Provider Note   CSN: [id number] Arrival date & time: 02/22/17  2016   By signing my name below, I, Eunice Blase, attest that this documentation has been prepared under the direction and in the presence of Julianne Rice, MD. Electronically signed, Eunice Blase, ED Scribe. 02/22/17. 11:55 PM.   History   Chief Complaint Chief Complaint  Patient presents with  . Chest Pain   The history is provided by the patient and medical records. No language interpreter was used.    HPI Comments: Erin Sexton is a 46 y.o. female with Hx of bronchitis and CHF BIB EMS who presents to the Emergency Department complaining of chest tightness that she noticed today x 1 hour. She notes associated SOB, dry cough, hot flashes, dizziness and chillsFor the past day. Pt reportedly uses an inhaler. She notes minimal relief with use of her inhaler from afforementioned symptoms. Pt denies fever, abdominal pain, leg pain or swelling and blood thinner use. Pt reportedly no longer has regular periods.  Past Medical History:  Diagnosis Date  . Abnormal CT scan    a. 02/2014: evaluated for dyspnea and initially placed on Xarelto for PE because radiologist could not exclude small peripheral pulmonary emboli because of limited contrast. However, after admission for ++vaginal bleeding several days later, it was felt she did NOT have PE - underwent further testing with neg VQ 03/06/14, neg LE duplex.  . Anemia   . Anxiety   . Asthma   . Blood transfusion without reported diagnosis   . Bone spur of right foot   . CHF (congestive heart failure) ([place])   . COPD (chronic obstructive pulmonary disease) ([place])   . Depression   . Diabetes mellitus without complication ([place])    a. Dx 02/2014.  [name] History of echocardiogram 2015   normal EF, Grade II diastolic dysfunction  . HOCM (hypertrophic obstructive cardiomyopathy) ([place])    a. Dx 52778.  [name] Hypertension   . Morbid obesity ([place])   . NSVT  (nonsustained ventricular tachycardia) ([place])    a. Noted on tele during 02/2014 adm for symptomatic anemia.  [name] SVT (supraventricular tachycardia) ([place])    a. Noted on tele during 02/2014 adm for symptomatic anemia.  . Vaginal bleeding     Patient Active Problem List   Diagnosis Date Noted  . Facial droop 06/20/2016  . TIA (transient ischemic attack) 06/20/2016  . Chronic diastolic CHF (congestive heart failure) ([place]) 06/20/2016  . Complex endometrial hyperplasia without atypia 06/08/2014  . Menorrhagia 06/01/2014  . Physical deconditioning 05/25/2014  . Catheter-associated urinary tract infection ([place]) 05/20/2014  . Hypokalemia 05/20/2014  . Acute on chronic diastolic congestive heart failure ([place]) 05/18/2014  . Diabetes mellitus type 2, noninsulin dependent ([place])   . OSA (obstructive sleep apnea) 03/21/2014  . Vaginal bleeding 03/06/2014  . Acute blood loss anemia 03/06/2014  . HOCM (hypertrophic obstructive cardiomyopathy) ([place]) 03/06/2014  . Morbid obesity ([place]) 03/02/2014  . Essential hypertension, benign 03/02/2014  . Asthma, chronic 03/02/2014    Past Surgical History:  Procedure Laterality Date  . DILITATION & CURRETTAGE/HYSTROSCOPY WITH NOVASURE ABLATION N/A 08/07/2016   Procedure: DILATATION & CURETTAGE/HYSTEROSCOPY WITH ATTPEMPTED NOVASURE ABLATION AND INSERTION OF IUD;  Surgeon: Florian Buff, MD;  Location: [facility];  Service: Gynecology;  Laterality: N/A;    OB History    Gravida Para Term Preterm AB Living             0   SAB TAB Ectopic Multiple Live Births  Home Medications    Prior to Admission medications   Medication Sig Start Date End Date Taking? Authorizing Provider  acetaminophen (TYLENOL) 325 MG tablet Take 2 tablets (650 mg total) by mouth every 6 (six) hours as needed for mild pain or moderate pain. Patient not taking: Reported on 11/14/2016 11/07/16   Waynetta Pean, PA-C  albuterol (PROVENTIL) (5 MG/ML) 0.5% nebulizer solution  Take 0.5 mLs (2.5 mg total) by nebulization every 6 (six) hours as needed for wheezing or shortness of breath. 02/22/17   Julianne Rice, MD  aspirin EC 81 MG EC tablet Take 1 tablet (81 mg total) by mouth daily. 06/21/16   Erline Hau, MD  atorvastatin (LIPITOR) 80 MG tablet TK 1 T PO D 01/25/16   Historical Provider, MD  buPROPion (WELLBUTRIN SR) 150 MG 12 hr tablet TAKE ONE TABLET BY MOUTH TWICE DAILY 06/01/15   Historical Provider, MD  Cholecalciferol (VITAMIN D PO) Take 1 capsule by mouth daily.    Historical Provider, MD  Diclofenac Sodium 3 % GEL Place 1 application onto the skin 2 (two) times daily. To affected area. 11/07/16   Waynetta Pean, PA-C  glipiZIDE (GLUCOTROL) 5 MG tablet Take 5 mg by mouth 2 (two) times daily before a meal.     Historical Provider, MD  levofloxacin (LEVAQUIN) 750 MG tablet Take 1 tablet (750 mg total) by mouth daily. X 7 days 02/23/17   Julianne Rice, MD  levothyroxine (SYNTHROID, LEVOTHROID) 150 MCG tablet Take 150 mcg by mouth daily before breakfast.  04/10/16   Historical Provider, MD  metFORMIN (GLUCOPHAGE) 500 MG tablet Take 1 tablet (500 mg total) by mouth 2 (two) times daily with a meal. 05/27/14   Lavon Paganini Angiulli, PA-C  metoprolol (LOPRESSOR) 50 MG tablet Take 50 mg by mouth 2 (two) times daily.  11/16/14   Historical Provider, MD  potassium chloride SA (K-DUR,KLOR-CON) 20 MEQ tablet Take 20 mEq by mouth 2 (two) times daily.     Historical Provider, MD  predniSONE (DELTASONE) 20 MG tablet 3 tabs po day one, then 2 tabs daily x 4 days 02/23/17   Julianne Rice, MD  torsemide (DEMADEX) 20 MG tablet Take 1 tablet (20 mg total) by mouth daily. Patient taking differently: Take 20 mg by mouth 2 (two) times daily.  05/01/15   Herminio Commons, MD    Family History Family History  Problem Relation Age of Onset  . Diabetes Mother   . Arthritis Mother   . Other Mother     WPW  . Uterine cancer Maternal Grandmother   . CAD Neg Hx     No family  history of EARLY CAD    Social History Social History  Substance Use Topics  . Smoking status: Never Smoker  . Smokeless tobacco: Never Used  . Alcohol use No     Allergies   Patient has no known allergies.   Review of Systems Review of Systems  Constitutional: Positive for chills. Negative for fever.  Respiratory: Positive for cough, chest tightness, shortness of breath and wheezing.   Cardiovascular: Negative for chest pain, palpitations and leg swelling.  Gastrointestinal: Negative for abdominal pain, diarrhea, nausea and vomiting.  Genitourinary: Negative for dysuria, flank pain and frequency.  Musculoskeletal: Negative for back pain, myalgias, neck pain and neck stiffness.  Skin: Negative for rash and wound.  Neurological: Negative for dizziness, weakness, light-headedness, numbness and headaches.  Hematological: Does not bruise/bleed easily.  All other systems reviewed and are negative.  Physical Exam Updated Vital Signs BP 134/70 (BP Location: Left Arm)   Pulse 90   Temp 98.2 F (36.8 C) (Oral)   Resp (!) 23   Ht 5\' 9"  (1.753 m)   Wt (!) 357 lb (161.9 kg)   LMP 06/04/2014   SpO2 96%   BMI 52.72 kg/m   Physical Exam  Constitutional: She is oriented to person, place, and time. She appears well-developed and well-nourished.  Obese  HENT:  Head: Normocephalic and atraumatic.  Mouth/Throat: Oropharynx is clear and moist. No oropharyngeal exudate.  Eyes: EOM are normal. Pupils are equal, round, and reactive to light.  Neck: Normal range of motion. Neck supple. No JVD present.  Cardiovascular: Normal rate and regular rhythm.  Exam reveals no gallop and no friction rub.   Murmur heard. Pulmonary/Chest: Effort normal. No respiratory distress. She has wheezes. She has rales. She exhibits no tenderness.  Rhonchi and expiratory wheezes in bilateral bases.  Abdominal: Soft. Bowel sounds are normal. There is no tenderness. There is no rebound and no guarding.    Musculoskeletal: Normal range of motion. She exhibits no edema or tenderness.  No lower extremity swelling, asymmetry or tenderness. Distal pulses intact.  Neurological: She is alert and oriented to person, place, and time.  Moving all extremities without deficit. Sensation fully intact.  Skin: Skin is warm and dry. No rash noted. No erythema.  Psychiatric: She has a normal mood and affect. Her behavior is normal.  Nursing note and vitals reviewed.    ED Treatments / Results  DIAGNOSTIC STUDIES: Oxygen Saturation is 96% on RA, normal by my interpretation.    COORDINATION OF CARE: 8:33 PM Discussed treatment plan with pt at bedside and pt agreed to plan. Will order labs and medication.  Labs (all labs ordered are listed, but only abnormal results are displayed) Labs Reviewed  COMPREHENSIVE METABOLIC PANEL - Abnormal; Notable for the following:       Result Value   Chloride 100 (*)    Glucose, Bld 242 (*)    BUN 22 (*)    Creatinine, Ser 1.30 (*)    Calcium 8.4 (*)    Alkaline Phosphatase 138 (*)    GFR calc non Af Amer 48 (*)    GFR calc Af Amer 55 (*)    All other components within normal limits  CBC WITH DIFFERENTIAL/PLATELET  BRAIN NATRIURETIC PEPTIDE  TROPONIN I    EKG  EKG Interpretation  Date/Time:  Saturday February 22 2017 20:25:44 EDT Ventricular Rate:  89 PR Interval:    QRS Duration: 104 QT Interval:  386 QTC Calculation: 470 R Axis:   -13 Text Interpretation:  Sinus rhythm LVH with secondary repolarization abnormality Baseline wander in lead(s) V3 V5 Confirmed by Lita Mains  MD, Phoenyx Melka ([id number]) on 02/22/2017 8:55:33 PM       Radiology Dg Chest Port 1 View  Result Date: 02/22/2017 CLINICAL DATA:  46 year old female with history of chest pain shortness of breath today. Cough for the past several days. EXAM: PORTABLE CHEST 1 VIEW COMPARISON:  Chest x-ray 04/30/2016. FINDINGS: Ill-defined opacities in the left lower lobe, concerning for developing airspace  consolidation. Right lung is clear. No pleural effusions. No evidence of pulmonary edema. Heart size is upper limits of normal. Upper mediastinal contours are within normal limits. IMPRESSION: 1. Developing airspace consolidation in the left lower lobe concerning for pneumonia. Followup PA and lateral chest X-ray is recommended in 3-4 weeks following trial of antibiotic therapy to ensure resolution and exclude underlying  malignancy. Electronically Signed   By: Vinnie Langton M.D.   On: 02/22/2017 20:58    Procedures Procedures (including critical care time)  Medications Ordered in ED Medications  acetaminophen (TYLENOL) tablet 650 mg (not administered)  methylPREDNISolone sodium succinate (SOLU-MEDROL) 125 mg/2 mL injection 125 mg (125 mg Intravenous Given 02/22/17 2136)  albuterol (PROVENTIL) (2.5 MG/3ML) 0.083% nebulizer solution 5 mg (5 mg Nebulization Given 02/22/17 2105)  levofloxacin (LEVAQUIN) IVPB 750 mg (0 mg Intravenous Stopped 02/22/17 2310)  sodium chloride 0.9 % bolus 500 mL (500 mLs Intravenous New Bag/Given 02/22/17 2311)  albuterol (PROVENTIL) (2.5 MG/3ML) 0.083% nebulizer solution 5 mg (5 mg Nebulization Given 02/22/17 2303)     Initial Impression / Assessment and Plan / ED Course  I have reviewed the triage vital signs and the nursing notes.  Pertinent labs & imaging results that were available during my care of the patient were reviewed by me and considered in my medical decision making (see chart for details).    I personally performed the services described in this documentation, which was scribed in my presence. The recorded information has been reviewed and is accurate.    Patient states she is feeling much better after nebulized treatment. Chest x-ray with evidence of left basilar infiltrate. Given IV Levaquin in the emergency department we'll discharge home with 6 day course. Also gets short supply of prednisone. Patient given return precautions has voiced  understanding. Final Clinical Impressions(s) / ED Diagnoses   Final diagnoses:  Community acquired pneumonia of left lower lobe of lung (HCC)    New Prescriptions New Prescriptions   ALBUTEROL (PROVENTIL) (5 MG/ML) 0.5% NEBULIZER SOLUTION    Take 0.5 mLs (2.5 mg total) by nebulization every 6 (six) hours as needed for wheezing or shortness of breath.   LEVOFLOXACIN (LEVAQUIN) 750 MG TABLET    Take 1 tablet (750 mg total) by mouth daily. X 7 days   PREDNISONE (DELTASONE) 20 MG TABLET    3 tabs po day one, then 2 tabs daily x 4 days     Julianne Rice, MD 02/22/17 2355

## 2017-02-22 NOTE — ED Notes (Signed)
ED Provider at bedside. 

## 2017-02-24 DIAGNOSIS — R071 Chest pain on breathing: Secondary | ICD-10-CM | POA: Diagnosis not present

## 2017-02-24 DIAGNOSIS — J209 Acute bronchitis, unspecified: Secondary | ICD-10-CM | POA: Diagnosis not present

## 2017-02-26 ENCOUNTER — Ambulatory Visit: Payer: Medicare Other | Admitting: Orthopaedic Surgery

## 2017-04-04 ENCOUNTER — Other Ambulatory Visit (HOSPITAL_COMMUNITY): Payer: Self-pay | Admitting: Surgery

## 2017-04-24 ENCOUNTER — Ambulatory Visit: Payer: Medicare Other | Admitting: Registered"

## 2017-04-29 ENCOUNTER — Ambulatory Visit (HOSPITAL_COMMUNITY): Payer: Medicare Other

## 2017-04-30 DIAGNOSIS — E039 Hypothyroidism, unspecified: Secondary | ICD-10-CM | POA: Diagnosis not present

## 2017-04-30 DIAGNOSIS — I5022 Chronic systolic (congestive) heart failure: Secondary | ICD-10-CM | POA: Diagnosis not present

## 2017-04-30 DIAGNOSIS — E785 Hyperlipidemia, unspecified: Secondary | ICD-10-CM | POA: Diagnosis not present

## 2017-04-30 DIAGNOSIS — E119 Type 2 diabetes mellitus without complications: Secondary | ICD-10-CM | POA: Diagnosis not present

## 2017-04-30 DIAGNOSIS — E559 Vitamin D deficiency, unspecified: Secondary | ICD-10-CM | POA: Diagnosis not present

## 2017-05-29 DIAGNOSIS — E119 Type 2 diabetes mellitus without complications: Secondary | ICD-10-CM | POA: Diagnosis not present

## 2017-05-29 DIAGNOSIS — E559 Vitamin D deficiency, unspecified: Secondary | ICD-10-CM | POA: Diagnosis not present

## 2017-05-29 DIAGNOSIS — E039 Hypothyroidism, unspecified: Secondary | ICD-10-CM | POA: Diagnosis not present

## 2017-06-03 DIAGNOSIS — E785 Hyperlipidemia, unspecified: Secondary | ICD-10-CM | POA: Diagnosis not present

## 2017-06-03 DIAGNOSIS — E119 Type 2 diabetes mellitus without complications: Secondary | ICD-10-CM | POA: Diagnosis not present

## 2017-06-03 DIAGNOSIS — I5022 Chronic systolic (congestive) heart failure: Secondary | ICD-10-CM | POA: Diagnosis not present

## 2017-06-03 DIAGNOSIS — E039 Hypothyroidism, unspecified: Secondary | ICD-10-CM | POA: Diagnosis not present

## 2017-06-13 ENCOUNTER — Other Ambulatory Visit (HOSPITAL_COMMUNITY): Payer: Self-pay | Admitting: Internal Medicine

## 2017-06-13 DIAGNOSIS — Z1231 Encounter for screening mammogram for malignant neoplasm of breast: Secondary | ICD-10-CM

## 2017-06-19 ENCOUNTER — Ambulatory Visit (HOSPITAL_COMMUNITY): Payer: Medicare Other

## 2017-06-23 DIAGNOSIS — E119 Type 2 diabetes mellitus without complications: Secondary | ICD-10-CM | POA: Diagnosis not present

## 2017-06-23 DIAGNOSIS — Z6841 Body Mass Index (BMI) 40.0 and over, adult: Secondary | ICD-10-CM | POA: Diagnosis not present

## 2017-06-23 DIAGNOSIS — I5022 Chronic systolic (congestive) heart failure: Secondary | ICD-10-CM | POA: Diagnosis not present

## 2017-07-21 ENCOUNTER — Ambulatory Visit (HOSPITAL_COMMUNITY)
Admission: RE | Admit: 2017-07-21 | Discharge: 2017-07-21 | Disposition: A | Discharge status: Home / Self Care | Payer: Medicare Other | Source: Ambulatory Visit | Attending: Internal Medicine | Admitting: Internal Medicine

## 2017-07-21 DIAGNOSIS — Z1231 Encounter for screening mammogram for malignant neoplasm of breast: Secondary | ICD-10-CM | POA: Diagnosis not present

## 2017-07-28 DIAGNOSIS — I5022 Chronic systolic (congestive) heart failure: Secondary | ICD-10-CM | POA: Diagnosis not present

## 2017-07-28 DIAGNOSIS — Z6841 Body Mass Index (BMI) 40.0 and over, adult: Secondary | ICD-10-CM | POA: Diagnosis not present

## 2017-07-28 DIAGNOSIS — E119 Type 2 diabetes mellitus without complications: Secondary | ICD-10-CM | POA: Diagnosis not present

## 2017-08-01 ENCOUNTER — Encounter: Payer: Self-pay | Admitting: Cardiovascular Disease

## 2017-08-01 ENCOUNTER — Ambulatory Visit: Payer: Medicare Other | Admitting: Cardiovascular Disease

## 2017-09-02 DIAGNOSIS — E119 Type 2 diabetes mellitus without complications: Secondary | ICD-10-CM | POA: Diagnosis not present

## 2017-09-02 DIAGNOSIS — E559 Vitamin D deficiency, unspecified: Secondary | ICD-10-CM | POA: Diagnosis not present

## 2017-09-02 DIAGNOSIS — Z114 Encounter for screening for human immunodeficiency virus [HIV]: Secondary | ICD-10-CM | POA: Diagnosis not present

## 2017-09-02 DIAGNOSIS — I5022 Chronic systolic (congestive) heart failure: Secondary | ICD-10-CM | POA: Diagnosis not present

## 2017-09-02 DIAGNOSIS — E785 Hyperlipidemia, unspecified: Secondary | ICD-10-CM | POA: Diagnosis not present

## 2017-09-02 DIAGNOSIS — I10 Essential (primary) hypertension: Secondary | ICD-10-CM | POA: Diagnosis not present

## 2017-09-02 DIAGNOSIS — E039 Hypothyroidism, unspecified: Secondary | ICD-10-CM | POA: Diagnosis not present

## 2017-09-03 ENCOUNTER — Encounter: Payer: Self-pay | Admitting: Cardiovascular Disease

## 2017-09-03 ENCOUNTER — Ambulatory Visit (INDEPENDENT_AMBULATORY_CARE_PROVIDER_SITE_OTHER): Payer: Medicare Other | Admitting: Cardiovascular Disease

## 2017-09-03 VITALS — BP 104/60 | HR 73 | Ht 68.0 in | Wt 353.0 lb

## 2017-09-03 DIAGNOSIS — I5032 Chronic diastolic (congestive) heart failure: Secondary | ICD-10-CM | POA: Diagnosis not present

## 2017-09-03 DIAGNOSIS — I10 Essential (primary) hypertension: Secondary | ICD-10-CM | POA: Diagnosis not present

## 2017-09-03 DIAGNOSIS — E785 Hyperlipidemia, unspecified: Secondary | ICD-10-CM | POA: Diagnosis not present

## 2017-09-03 DIAGNOSIS — I421 Obstructive hypertrophic cardiomyopathy: Secondary | ICD-10-CM | POA: Diagnosis not present

## 2017-09-03 NOTE — Patient Instructions (Signed)

## 2017-09-03 NOTE — Progress Notes (Signed)
SUBJECTIVE: The patient presents for routine follow up. She has a history of hypertropic cardiomyopathy, morbid obesity, hypertension, asthma, sleep apnea, hyperlipidemia, anemia, and chronic diastolic heart failure with grade 1 diastolic dysfunction.  Echo 06/21/16: Normal LV fxn, EF 65-7-%, mean gradient 22 mmHg, peak 48 mmHg, severe asymmetric LVH, DD 1.  She is doing very well. She has lost 8 pounds since her last visit and 20 pounds altogether. She denies chest pain. She seldom has palpitations which are usually alleviated with deep breaths. She very seldom has to take an extra dose of metoprolol.  She is looking into bariatric surgery in the form of a gastric sleeve.   Review of Systems: As per "subjective", otherwise negative.  No Known Allergies  Current Outpatient Prescriptions  Medication Sig Dispense Refill  . acetaminophen (TYLENOL) 325 MG tablet Take 2 tablets (650 mg total) by mouth every 6 (six) hours as needed for mild pain or moderate pain. 60 tablet 0  . albuterol (PROVENTIL) (5 MG/ML) 0.5% nebulizer solution Take 0.5 mLs (2.5 mg total) by nebulization every 6 (six) hours as needed for wheezing or shortness of breath. 20 mL 0  . aspirin EC 81 MG EC tablet Take 1 tablet (81 mg total) by mouth daily.    [name] atorvastatin (LIPITOR) 80 MG tablet TK 1 T PO D  3  . buPROPion (WELLBUTRIN SR) 150 MG 12 hr tablet TAKE ONE TABLET BY MOUTH TWICE DAILY  2  . Cholecalciferol (VITAMIN D PO) Take 1 capsule by mouth daily.    . Diclofenac Sodium 3 % GEL Place 1 application onto the skin 2 (two) times daily. To affected area. 50 g 0  . glipiZIDE (GLUCOTROL) 5 MG tablet Take 5 mg by mouth 2 (two) times daily before a meal.     . levothyroxine (SYNTHROID, LEVOTHROID) 150 MCG tablet Take 150 mcg by mouth daily before breakfast.     . metFORMIN (GLUCOPHAGE) 500 MG tablet Take 1 tablet (500 mg total) by mouth 2 (two) times daily with a meal. 60 tablet 0  . metoprolol (LOPRESSOR) 50 MG  tablet Take 50 mg by mouth 2 (two) times daily.     . potassium chloride SA (K-DUR,KLOR-CON) 20 MEQ tablet Take 20 mEq by mouth 2 (two) times daily.     [name] torsemide (DEMADEX) 20 MG tablet Take 1 tablet (20 mg total) by mouth daily. (Patient taking differently: Take 20 mg by mouth 2 (two) times daily. ) 90 tablet 3   Current Facility-Administered Medications  Medication Dose Route Frequency Provider Last Rate Last Dose  . medroxyPROGESTERone (DEPO-PROVERA) injection 300 mg  300 mg Intramuscular Once Florian Buff, MD        Past Medical History:  Diagnosis Date  . Abnormal CT scan    a. 02/2014: evaluated for dyspnea and initially placed on Xarelto for PE because radiologist could not exclude small peripheral pulmonary emboli because of limited contrast. However, after admission for ++vaginal bleeding several days later, it was felt she did NOT have PE - underwent further testing with neg VQ 03/06/14, neg LE duplex.  . Anemia   . Anxiety   . Asthma   . Blood transfusion without reported diagnosis   . Bone spur of right foot   . CHF (congestive heart failure) ([place])   . COPD (chronic obstructive pulmonary disease) ([place])   . Depression   . Diabetes mellitus without complication ([place])    a. Dx 02/2014.  [name] History of  echocardiogram 2015   normal EF, Grade II diastolic dysfunction  . HOCM (hypertrophic obstructive cardiomyopathy) ([place])    a. Dx 63846.  [name] Hypertension   . Morbid obesity ([place])   . NSVT (nonsustained ventricular tachycardia) ([place])    a. Noted on tele during 02/2014 adm for symptomatic anemia.  [name] SVT (supraventricular tachycardia) ([place])    a. Noted on tele during 02/2014 adm for symptomatic anemia.  . Vaginal bleeding     Past Surgical History:  Procedure Laterality Date  . DILITATION & CURRETTAGE/HYSTROSCOPY WITH NOVASURE ABLATION N/A 08/07/2016   Procedure: DILATATION & CURETTAGE/HYSTEROSCOPY WITH ATTPEMPTED NOVASURE ABLATION AND INSERTION OF IUD;  Surgeon: Florian Buff,  MD;  Location: [facility];  Service: Gynecology;  Laterality: N/A;    Social History   Social History  . Marital status: Divorced    Spouse name: N/A  . Number of children: N/A  . Years of education: N/A   Occupational History  . Not on file.   Social History Main Topics  . Smoking status: Never Smoker  . Smokeless tobacco: Never Used  . Alcohol use No  . Drug use: No  . Sexual activity: Not Currently    Birth control/ protection: Pill   Other Topics Concern  . Not on file   Social History Narrative  . No narrative on file     Vitals:   09/03/17 0849  BP: 104/60  Pulse: 73  SpO2: 97%  Weight: (!) 353 lb (160.1 kg)  Height: 5\' 8"  (1.727 m)    Wt Readings from Last 3 Encounters:  09/03/17 (!) 353 lb (160.1 kg)  02/22/17 (!) 357 lb (161.9 kg)  11/14/16 (!) 361 lb 3.2 oz (163.8 kg)     PHYSICAL EXAM General: NAD, morbidly obese. HEENT: Normal. Neck: Difficult to assess JVP, no thyromegaly. Lungs: Clear to auscultation bilaterally with normal respiratory effort. CV: Nondisplaced PMI. Regular rate and rhythm, normal S1/S2, no S3/S4, soft I/VI pansystolic murmur along left sternal border. No periankle edema.  Abdomen: Morbidly obese. Neurologic: Alert and oriented.  Psych: Normal affect. Skin: Normal. Musculoskeletal: No gross deformities.    ECG: Most recent ECG reviewed.   Labs: Lab Results  Component Value Date/Time   K 3.5 02/22/2017 08:50 PM   BUN 22 (H) 02/22/2017 08:50 PM   CREATININE 1.30 (H) 02/22/2017 08:50 PM   CREATININE 1.16 (H) 08/26/2014 09:24 AM   ALT 33 02/22/2017 08:50 PM   HGB 13.6 02/22/2017 08:50 PM     Lipids: Lab Results  Component Value Date/Time   LDLCALC 54 06/21/2016 05:10 AM   CHOL 97 06/21/2016 05:10 AM   TRIG 91 06/21/2016 05:10 AM   HDL 25 (L) 06/21/2016 05:10 AM       ASSESSMENT AND PLAN:  1. Hypertrophic cardiomyopathy: Symptomatically stable. Continue metoprolol 50 mg twice daily.  2. Chronic diastolic  heart failure: Euvolemic. No changes to therapy.  3. Essential HTN: Well controlled on metoprolol. No changes.  4. Hyperlipidemia: Continue atorvastatin 80 mg daily.  5. Morbid obesity: She is now looking into bariatric surgery in the form of gastric sleeve. She has already lost weight and I encouraged her regarding this.   Disposition: Follow up 1 year   Kate Sable, M.D., F.A.C.C.

## 2017-10-07 DIAGNOSIS — E119 Type 2 diabetes mellitus without complications: Secondary | ICD-10-CM | POA: Diagnosis not present

## 2017-10-07 DIAGNOSIS — I10 Essential (primary) hypertension: Secondary | ICD-10-CM | POA: Diagnosis not present

## 2017-10-07 DIAGNOSIS — E785 Hyperlipidemia, unspecified: Secondary | ICD-10-CM | POA: Diagnosis not present

## 2017-10-07 DIAGNOSIS — Z23 Encounter for immunization: Secondary | ICD-10-CM | POA: Diagnosis not present

## 2017-10-07 DIAGNOSIS — E039 Hypothyroidism, unspecified: Secondary | ICD-10-CM | POA: Diagnosis not present

## 2017-10-07 DIAGNOSIS — Z6841 Body Mass Index (BMI) 40.0 and over, adult: Secondary | ICD-10-CM | POA: Diagnosis not present

## 2017-11-24 ENCOUNTER — Other Ambulatory Visit: Payer: Medicare Other | Admitting: Obstetrics & Gynecology

## 2018-01-06 DIAGNOSIS — R350 Frequency of micturition: Secondary | ICD-10-CM | POA: Diagnosis not present

## 2018-01-06 DIAGNOSIS — E039 Hypothyroidism, unspecified: Secondary | ICD-10-CM | POA: Diagnosis not present

## 2018-01-06 DIAGNOSIS — E119 Type 2 diabetes mellitus without complications: Secondary | ICD-10-CM | POA: Diagnosis not present

## 2018-01-06 DIAGNOSIS — I10 Essential (primary) hypertension: Secondary | ICD-10-CM | POA: Diagnosis not present

## 2018-01-06 DIAGNOSIS — Z6841 Body Mass Index (BMI) 40.0 and over, adult: Secondary | ICD-10-CM | POA: Diagnosis not present

## 2018-01-06 DIAGNOSIS — E785 Hyperlipidemia, unspecified: Secondary | ICD-10-CM | POA: Diagnosis not present

## 2018-04-13 DIAGNOSIS — Z6841 Body Mass Index (BMI) 40.0 and over, adult: Secondary | ICD-10-CM | POA: Diagnosis not present

## 2018-04-13 DIAGNOSIS — E119 Type 2 diabetes mellitus without complications: Secondary | ICD-10-CM | POA: Diagnosis not present

## 2018-04-13 DIAGNOSIS — I10 Essential (primary) hypertension: Secondary | ICD-10-CM | POA: Diagnosis not present

## 2018-04-13 DIAGNOSIS — E039 Hypothyroidism, unspecified: Secondary | ICD-10-CM | POA: Diagnosis not present

## 2018-04-13 DIAGNOSIS — M25512 Pain in left shoulder: Secondary | ICD-10-CM | POA: Diagnosis not present

## 2018-04-16 DIAGNOSIS — M19012 Primary osteoarthritis, left shoulder: Secondary | ICD-10-CM | POA: Diagnosis not present

## 2018-04-16 DIAGNOSIS — M25512 Pain in left shoulder: Secondary | ICD-10-CM | POA: Diagnosis not present

## 2018-04-22 DIAGNOSIS — M7502 Adhesive capsulitis of left shoulder: Secondary | ICD-10-CM | POA: Diagnosis not present

## 2018-04-22 DIAGNOSIS — M25512 Pain in left shoulder: Secondary | ICD-10-CM | POA: Diagnosis not present

## 2018-04-22 DIAGNOSIS — M19012 Primary osteoarthritis, left shoulder: Secondary | ICD-10-CM | POA: Diagnosis not present

## 2018-06-16 ENCOUNTER — Other Ambulatory Visit (HOSPITAL_COMMUNITY): Payer: Self-pay | Admitting: Internal Medicine

## 2018-06-16 DIAGNOSIS — Z1231 Encounter for screening mammogram for malignant neoplasm of breast: Secondary | ICD-10-CM

## 2018-07-27 ENCOUNTER — Ambulatory Visit (HOSPITAL_COMMUNITY)
Admission: RE | Admit: 2018-07-27 | Discharge: 2018-07-27 | Disposition: A | Discharge status: Home / Self Care | Payer: Medicare Other | Source: Ambulatory Visit | Attending: Internal Medicine | Admitting: Internal Medicine

## 2018-07-27 ENCOUNTER — Encounter (HOSPITAL_COMMUNITY): Payer: Self-pay

## 2018-07-27 DIAGNOSIS — I10 Essential (primary) hypertension: Secondary | ICD-10-CM | POA: Diagnosis not present

## 2018-07-27 DIAGNOSIS — E119 Type 2 diabetes mellitus without complications: Secondary | ICD-10-CM | POA: Diagnosis not present

## 2018-07-27 DIAGNOSIS — Z1231 Encounter for screening mammogram for malignant neoplasm of breast: Secondary | ICD-10-CM | POA: Diagnosis not present

## 2018-07-27 DIAGNOSIS — E039 Hypothyroidism, unspecified: Secondary | ICD-10-CM | POA: Diagnosis not present

## 2018-09-08 DIAGNOSIS — E559 Vitamin D deficiency, unspecified: Secondary | ICD-10-CM | POA: Diagnosis not present

## 2018-09-08 DIAGNOSIS — I5022 Chronic systolic (congestive) heart failure: Secondary | ICD-10-CM | POA: Diagnosis not present

## 2018-09-08 DIAGNOSIS — E039 Hypothyroidism, unspecified: Secondary | ICD-10-CM | POA: Diagnosis not present

## 2018-09-08 DIAGNOSIS — I10 Essential (primary) hypertension: Secondary | ICD-10-CM | POA: Diagnosis not present

## 2018-09-08 DIAGNOSIS — Z23 Encounter for immunization: Secondary | ICD-10-CM | POA: Diagnosis not present

## 2018-09-08 DIAGNOSIS — E119 Type 2 diabetes mellitus without complications: Secondary | ICD-10-CM | POA: Diagnosis not present

## 2018-09-08 DIAGNOSIS — E785 Hyperlipidemia, unspecified: Secondary | ICD-10-CM | POA: Diagnosis not present

## 2018-10-13 ENCOUNTER — Encounter: Payer: Self-pay | Admitting: Cardiovascular Disease

## 2018-10-13 ENCOUNTER — Ambulatory Visit (INDEPENDENT_AMBULATORY_CARE_PROVIDER_SITE_OTHER): Payer: Medicare Other | Admitting: Cardiovascular Disease

## 2018-10-13 VITALS — BP 118/76 | HR 80 | Ht 69.0 in | Wt 357.0 lb

## 2018-10-13 DIAGNOSIS — I10 Essential (primary) hypertension: Secondary | ICD-10-CM | POA: Diagnosis not present

## 2018-10-13 DIAGNOSIS — I5032 Chronic diastolic (congestive) heart failure: Secondary | ICD-10-CM | POA: Diagnosis not present

## 2018-10-13 DIAGNOSIS — I421 Obstructive hypertrophic cardiomyopathy: Secondary | ICD-10-CM | POA: Diagnosis not present

## 2018-10-13 DIAGNOSIS — E785 Hyperlipidemia, unspecified: Secondary | ICD-10-CM

## 2018-10-13 NOTE — Patient Instructions (Signed)
Medication Instructions:  Your physician recommends that you continue on your current medications as directed. Please refer to the Current Medication list given to you today.  If you need a refill on your cardiac medications before your next appointment, please call your pharmacy.   Lab work: NONE If you have labs (blood work) drawn today and your tests are completely normal, you will receive your results only by: [name] MyChart Message (if you have MyChart) OR . A paper copy in the mail If you have any lab test that is abnormal or we need to change your treatment, we will call you to review the results.  Testing/Procedures: NONE  Follow-Up: At [facility], you and your health needs are our priority.  As part of our continuing mission to provide you with exceptional heart care, we have created designated Provider Care Teams.  These Care Teams include your primary Cardiologist (physician) and Advanced Practice Providers (APPs -  Physician Assistants and Nurse Practitioners) who all work together to provide you with the care you need, when you need it. You will need a follow up appointment in 1 years.  Please call our office 2 months in advance to schedule this appointment.  You may see Dr.Koneswaran  or one of the following Advanced Practice Providers on your designated Care Team:   [place], PA-C ([place]) . Ermalinda Barrios, PA-C ([place])  Any Other Special Instructions Will Be Listed Below (If Applicable). NONE

## 2018-10-13 NOTE — Progress Notes (Signed)
SUBJECTIVE: The patient presents for routine follow up. She has a history of hypertropic cardiomyopathy, morbid obesity, hypertension, asthma, sleep apnea, hyperlipidemia, anemia, and chronic diastolic heart failure with grade 1diastolic dysfunction.  Echo 06/21/16: Vigorous left ventricular systolic function, EF 22-29%, mean gradient 22 mmHg, peak 48 mmHg, severe asymmetric LVH, DD 1.  The patient denies any symptoms of chest pain, palpitations, shortness of breath, lightheadedness, dizziness, leg swelling, orthopnea, PND, and syncope.  She started NP Thyroid about a month ago and has been losing 1 pound per week.  She drinks primarily water and green tea.  She no longer drinks soft drinks.  She is feeling well overall.  ECG performed in the office today which I ordered and personally interpreted demonstrates normal sinus rhythm with LVH and repolarization abnormalities.   Review of Systems: As per "subjective", otherwise negative.  No Known Allergies  Current Outpatient Medications  Medication Sig Dispense Refill  . acetaminophen (TYLENOL) 325 MG tablet Take 2 tablets (650 mg total) by mouth every 6 (six) hours as needed for mild pain or moderate pain. 60 tablet 0  . albuterol (PROVENTIL) (5 MG/ML) 0.5% nebulizer solution Take 0.5 mLs (2.5 mg total) by nebulization every 6 (six) hours as needed for wheezing or shortness of breath. 20 mL 0  . aspirin EC 81 MG EC tablet Take 1 tablet (81 mg total) by mouth daily.    [name] atorvastatin (LIPITOR) 80 MG tablet TK 1 T PO D  3  . buPROPion (WELLBUTRIN SR) 150 MG 12 hr tablet TAKE ONE TABLET BY MOUTH TWICE DAILY  2  . Cholecalciferol (VITAMIN D PO) Take 1 capsule by mouth daily.    . Diclofenac Sodium 3 % GEL Place 1 application onto the skin 2 (two) times daily. To affected area. 50 g 0  . metFORMIN (GLUCOPHAGE) 500 MG tablet Take 1 tablet (500 mg total) by mouth 2 (two) times daily with a meal. 60 tablet 0  . metoprolol (LOPRESSOR) 50 MG  tablet Take 50 mg by mouth 2 (two) times daily.     . potassium chloride SA (K-DUR,KLOR-CON) 20 MEQ tablet Take 20 mEq by mouth 2 (two) times daily.     [name] thyroid (NP THYROID) 120 MG tablet Take 120 mg by mouth daily before breakfast.    . torsemide (DEMADEX) 20 MG tablet Take 1 tablet (20 mg total) by mouth daily. (Patient taking differently: Take 20 mg by mouth 2 (two) times daily. ) 90 tablet 3   Current Facility-Administered Medications  Medication Dose Route Frequency Provider Last Rate Last Dose  . medroxyPROGESTERone (DEPO-PROVERA) injection 300 mg  300 mg Intramuscular Once Florian Buff, MD        Past Medical History:  Diagnosis Date  . Abnormal CT scan    a. 02/2014: evaluated for dyspnea and initially placed on Xarelto for PE because radiologist could not exclude small peripheral pulmonary emboli because of limited contrast. However, after admission for ++vaginal bleeding several days later, it was felt she did NOT have PE - underwent further testing with neg VQ 03/06/14, neg LE duplex.  . Anemia   . Anxiety   . Asthma   . Blood transfusion without reported diagnosis   . Bone spur of right foot   . CHF (congestive heart failure) ([place])   . COPD (chronic obstructive pulmonary disease) ([place])   . Depression   . Diabetes mellitus without complication ([place])    a. Dx 02/2014.  [name] History of  echocardiogram 2015   normal EF, Grade II diastolic dysfunction  . HOCM (hypertrophic obstructive cardiomyopathy) ([place])    a. Dx 96759.  [name] Hypertension   . Morbid obesity ([place])   . NSVT (nonsustained ventricular tachycardia) ([place])    a. Noted on tele during 02/2014 adm for symptomatic anemia.  [name] SVT (supraventricular tachycardia) ([place])    a. Noted on tele during 02/2014 adm for symptomatic anemia.  . Vaginal bleeding     Past Surgical History:  Procedure Laterality Date  . DILITATION & CURRETTAGE/HYSTROSCOPY WITH NOVASURE ABLATION N/A 08/07/2016   Procedure: DILATATION &  CURETTAGE/HYSTEROSCOPY WITH ATTPEMPTED NOVASURE ABLATION AND INSERTION OF IUD;  Surgeon: Florian Buff, MD;  Location: [facility];  Service: Gynecology;  Laterality: N/A;    Social History   Socioeconomic History  . Marital status: Divorced    Spouse name: Not on file  . Number of children: Not on file  . Years of education: Not on file  . Highest education level: Not on file  Occupational History  . Not on file  Social Needs  . Financial resource strain: Not on file  . Food insecurity:    Worry: Not on file    Inability: Not on file  . Transportation needs:    Medical: Not on file    Non-medical: Not on file  Tobacco Use  . Smoking status: Never Smoker  . Smokeless tobacco: Never Used  Substance and Sexual Activity  . Alcohol use: No  . Drug use: No  . Sexual activity: Not Currently    Birth control/protection: Pill  Lifestyle  . Physical activity:    Days per week: Not on file    Minutes per session: Not on file  . Stress: Not on file  Relationships  . Social connections:    Talks on phone: Not on file    Gets together: Not on file    Attends religious service: Not on file    Active member of club or organization: Not on file    Attends meetings of clubs or organizations: Not on file    Relationship status: Not on file  . Intimate partner violence:    Fear of current or ex partner: Not on file    Emotionally abused: Not on file    Physically abused: Not on file    Forced sexual activity: Not on file  Other Topics Concern  . Not on file  Social History Narrative  . Not on file     Vitals:   10/13/18 1519  BP: 118/76  Pulse: 80  SpO2: 97%  Weight: (!) 357 lb (161.9 kg)  Height: 5\' 9"  (1.753 m)    Wt Readings from Last 3 Encounters:  10/13/18 (!) 357 lb (161.9 kg)  09/03/17 (!) 353 lb (160.1 kg)  02/22/17 (!) 357 lb (161.9 kg)     PHYSICAL EXAM General: NAD HEENT: Normal. Neck: Difficult to assess JVP, no thyromegaly. Lungs: Clear to auscultation  bilaterally with normal respiratory effort. CV: Regular rate and rhythm, normal S1/S2, no S3/S4, soft I/VI pansystolic murmur along left sternal border. No pretibial or periankle edema.  No carotid bruit.   Abdomen: Soft, obese.  Neurologic: Alert and oriented.  Psych: Normal affect. Skin: Normal. Musculoskeletal: No gross deformities.    ECG: Reviewed above under Subjective   Labs: Lab Results  Component Value Date/Time   K 3.5 02/22/2017 08:50 PM   BUN 22 (H) 02/22/2017 08:50 PM   CREATININE 1.30 (H) 02/22/2017 08:50 PM  CREATININE 1.16 (H) 08/26/2014 09:24 AM   ALT 33 02/22/2017 08:50 PM   HGB 13.6 02/22/2017 08:50 PM     Lipids: Lab Results  Component Value Date/Time   LDLCALC 54 06/21/2016 05:10 AM   CHOL 97 06/21/2016 05:10 AM   TRIG 91 06/21/2016 05:10 AM   HDL 25 (L) 06/21/2016 05:10 AM       ASSESSMENT AND PLAN:  1. Hypertrophic cardiomyopathy: Symptomatically stable. Continue metoprolol 50 mg twice daily.  2. Chronic diastolic heart failure: Euvolemic. No changes to therapy.  3. Essential HTN: Well controlled on metoprolol. No changes.  4. Hyperlipidemia: Continue atorvastatin 80 mg daily.  5. Morbid obesity: She been losing about a pound per week over the past month after starting NP thyroid.   Disposition: Follow up 1 yr   Kate Sable, M.D., F.A.C.C.

## 2018-11-09 DIAGNOSIS — M19019 Primary osteoarthritis, unspecified shoulder: Secondary | ICD-10-CM | POA: Diagnosis not present

## 2018-11-09 DIAGNOSIS — J019 Acute sinusitis, unspecified: Secondary | ICD-10-CM | POA: Diagnosis not present

## 2018-11-18 DIAGNOSIS — J019 Acute sinusitis, unspecified: Secondary | ICD-10-CM | POA: Diagnosis not present

## 2018-11-18 DIAGNOSIS — M19019 Primary osteoarthritis, unspecified shoulder: Secondary | ICD-10-CM | POA: Diagnosis not present

## 2018-11-18 DIAGNOSIS — N189 Chronic kidney disease, unspecified: Secondary | ICD-10-CM | POA: Diagnosis not present

## 2018-11-19 DIAGNOSIS — E119 Type 2 diabetes mellitus without complications: Secondary | ICD-10-CM | POA: Diagnosis not present

## 2018-11-19 DIAGNOSIS — Z7984 Long term (current) use of oral hypoglycemic drugs: Secondary | ICD-10-CM | POA: Diagnosis not present

## 2018-12-15 ENCOUNTER — Ambulatory Visit (INDEPENDENT_AMBULATORY_CARE_PROVIDER_SITE_OTHER): Payer: Medicare Other

## 2018-12-15 ENCOUNTER — Ambulatory Visit: Payer: Medicare Other | Admitting: Orthopaedic Surgery

## 2018-12-15 ENCOUNTER — Encounter: Payer: Self-pay | Admitting: Orthopaedic Surgery

## 2018-12-15 VITALS — BP 125/64 | HR 70 | Ht 69.0 in | Wt 357.0 lb

## 2018-12-15 DIAGNOSIS — G8929 Other chronic pain: Secondary | ICD-10-CM | POA: Diagnosis not present

## 2018-12-15 DIAGNOSIS — M25512 Pain in left shoulder: Secondary | ICD-10-CM

## 2018-12-15 DIAGNOSIS — Z6841 Body Mass Index (BMI) 40.0 and over, adult: Secondary | ICD-10-CM | POA: Diagnosis not present

## 2018-12-15 NOTE — Progress Notes (Signed)
Subjective:    Patient ID: Erin Sexton, female    DOB: 1971/08/15, 48 y.o.   MRN: [id number]  HPI She has had pain in the left shoulder since May of last year.  She was living in [place] then and saw her doctor there.  He gave her an injection in the shoulder and it helped.  But the pain returned before Thanksgiving.  She has no new trauma, no redness, no numbness. She was seen by Dr. Anastasio Champion and I have his notes.  She has pain now sleeping and trying to use the left arm.   The patient meets the AMA guidelines for Morbid (severe) obesity with a BMI > 40.0 and I have recommended weight loss.   Review of Systems  Constitutional: Positive for activity change.  Respiratory: Positive for shortness of breath. Negative for cough.   Musculoskeletal: Positive for arthralgias.  Psychiatric/Behavioral: The patient is nervous/anxious.   All other systems reviewed and are negative.  For Review of Systems, all other systems reviewed and are negative.  The following is a summary of the past history medically, past history surgically, known current medicines, social history and family history.  This information is gathered electronically by the computer from prior information and documentation.  I review this each visit and have found including this information at this point in the chart is beneficial and informative.   Past Medical History:  Diagnosis Date  . Abnormal CT scan    a. 02/2014: evaluated for dyspnea and initially placed on Xarelto for PE because radiologist could not exclude small peripheral pulmonary emboli because of limited contrast. However, after admission for ++vaginal bleeding several days later, it was felt she did NOT have PE - underwent further testing with neg VQ 03/06/14, neg LE duplex.  . Anemia   . Anxiety   . Asthma   . Blood transfusion without reported diagnosis   . Bone spur of right foot   . CHF (congestive heart failure) ([place])   . COPD (chronic obstructive  pulmonary disease) ([place])   . Depression   . Diabetes mellitus without complication ([place])    a. Dx 02/2014.  [name] History of echocardiogram 2015   normal EF, Grade II diastolic dysfunction  . HOCM (hypertrophic obstructive cardiomyopathy) ([place])    a. Dx 75102.  [name] Hypertension   . Morbid obesity ([place])   . NSVT (nonsustained ventricular tachycardia) ([place])    a. Noted on tele during 02/2014 adm for symptomatic anemia.  [name] SVT (supraventricular tachycardia) ([place])    a. Noted on tele during 02/2014 adm for symptomatic anemia.  . Vaginal bleeding     Past Surgical History:  Procedure Laterality Date  . DILITATION & CURRETTAGE/HYSTROSCOPY WITH NOVASURE ABLATION N/A 08/07/2016   Procedure: DILATATION & CURETTAGE/HYSTEROSCOPY WITH ATTPEMPTED NOVASURE ABLATION AND INSERTION OF IUD;  Surgeon: Florian Buff, MD;  Location: [facility];  Service: Gynecology;  Laterality: N/A;    Current Outpatient Medications on File Prior to Visit  Medication Sig Dispense Refill  . Albuterol Sulfate (PROAIR HFA IN) Inhale into the lungs.    [name] atorvastatin (LIPITOR) 80 MG tablet TK 1 T PO D  3  . buPROPion (WELLBUTRIN SR) 150 MG 12 hr tablet TAKE ONE TABLET BY MOUTH TWICE DAILY  2  . lisinopril (PRINIVIL,ZESTRIL) 5 MG tablet Take 2.5 mg by mouth daily.    . metoprolol (LOPRESSOR) 50 MG tablet Take 50 mg by mouth 2 (two) times daily.     . potassium  chloride SA (K-DUR,KLOR-CON) 20 MEQ tablet Take 20 mEq by mouth 2 (two) times daily.     [name] thyroid (NP THYROID) 120 MG tablet Take 120 mg by mouth daily before breakfast.    . acetaminophen (TYLENOL) 325 MG tablet Take 2 tablets (650 mg total) by mouth every 6 (six) hours as needed for mild pain or moderate pain. (Patient not taking: Reported on 12/15/2018) 60 tablet 0  . albuterol (PROVENTIL) (5 MG/ML) 0.5% nebulizer solution Take 0.5 mLs (2.5 mg total) by nebulization every 6 (six) hours as needed for wheezing or shortness of breath. (Patient not taking: Reported on 12/15/2018) 20 mL  0  . aspirin EC 81 MG EC tablet Take 1 tablet (81 mg total) by mouth daily. (Patient not taking: Reported on 12/15/2018)    . Cholecalciferol (VITAMIN D PO) Take 1 capsule by mouth daily.    . Diclofenac Sodium 3 % GEL Place 1 application onto the skin 2 (two) times daily. To affected area. (Patient not taking: Reported on 12/15/2018) 50 g 0  . metFORMIN (GLUCOPHAGE) 500 MG tablet Take 1 tablet (500 mg total) by mouth 2 (two) times daily with a meal. 60 tablet 0  . torsemide (DEMADEX) 20 MG tablet Take 1 tablet (20 mg total) by mouth daily. (Patient not taking: Reported on 12/15/2018) 90 tablet 3   Current Facility-Administered Medications on File Prior to Visit  Medication Dose Route Frequency Provider Last Rate Last Dose  . medroxyPROGESTERone (DEPO-PROVERA) injection 300 mg  300 mg Intramuscular Once Florian Buff, MD        Social History   Socioeconomic History  . Marital status: Divorced    Spouse name: Not on file  . Number of children: Not on file  . Years of education: Not on file  . Highest education level: Not on file  Occupational History  . Not on file  Social Needs  . Financial resource strain: Not on file  . Food insecurity:    Worry: Not on file    Inability: Not on file  . Transportation needs:    Medical: Not on file    Non-medical: Not on file  Tobacco Use  . Smoking status: Never Smoker  . Smokeless tobacco: Never Used  Substance and Sexual Activity  . Alcohol use: No  . Drug use: No  . Sexual activity: Not Currently    Birth control/protection: Pill  Lifestyle  . Physical activity:    Days per week: Not on file    Minutes per session: Not on file  . Stress: Not on file  Relationships  . Social connections:    Talks on phone: Not on file    Gets together: Not on file    Attends religious service: Not on file    Active member of club or organization: Not on file    Attends meetings of clubs or organizations: Not on file    Relationship status: Not on  file  . Intimate partner violence:    Fear of current or ex partner: Not on file    Emotionally abused: Not on file    Physically abused: Not on file    Forced sexual activity: Not on file  Other Topics Concern  . Not on file  Social History Narrative  . Not on file    Family History  Problem Relation Age of Onset  . Diabetes Mother   . Arthritis Mother   . Other Mother        WPW  .  Mental illness Mother   . Mental illness Sister   . Uterine cancer Maternal Grandmother   . Mental illness Maternal Grandmother   . CAD Neg Hx        No family history of EARLY CAD    BP 125/64   Pulse 70   Ht 5\' 9"  (1.753 m)   Wt (!) 357 lb (161.9 kg)   LMP 06/04/2014   BMI 52.72 kg/m   Body mass index is 52.72 kg/m.     Objective:   Physical Exam Constitutional:      Appearance: She is well-developed.  HENT:     Head: Normocephalic and atraumatic.  Eyes:     Conjunctiva/sclera: Conjunctivae normal.     Pupils: Pupils are equal, round, and reactive to light.  Neck:     Musculoskeletal: Normal range of motion and neck supple.  Cardiovascular:     Rate and Rhythm: Normal rate and regular rhythm.  Pulmonary:     Effort: Pulmonary effort is normal.  Abdominal:     Palpations: Abdomen is soft.  Musculoskeletal:     Left shoulder: She exhibits decreased range of motion and tenderness.       Arms:  Skin:    General: Skin is warm and dry.  Neurological:     Mental Status: She is alert and oriented to person, place, and time.     Cranial Nerves: No cranial nerve deficit.     Motor: No abnormal muscle tone.     Coordination: Coordination normal.     Deep Tendon Reflexes: Reflexes are normal and symmetric. Reflexes normal.  Psychiatric:        Behavior: Behavior normal.        Thought Content: Thought content normal.        Judgment: Judgment normal.      X-rays were done of the left shoulder, reported separately.     Assessment & Plan:   Encounter Diagnoses  Name  Primary?  . Chronic left shoulder pain Yes  . Body mass index 50.0-59.9, adult ([place])   . Morbid obesity ([place])    PROCEDURE NOTE:  The patient request injection, verbal consent was obtained.  The left shoulder was prepped appropriately after time out was performed.   Sterile technique was observed and injection of 1 cc of Depo-Medrol 40 mg with several cc's of plain xylocaine. Anesthesia was provided by ethyl chloride and a 20-gauge needle was used to inject the shoulder area. A posterior approach was used.  The injection was tolerated well.  A band aid dressing was applied.  The patient was advised to apply ice later today and tomorrow to the injection sight as needed.  I have recommended she take 3 Advil 3 times a day after eating.  She may need PT.  I have explained shoulder exercises to do at home with cane or stick.  Call if any problem.  Precautions discussed.   Electronically Signed Sanjuana Kava, MD 1/7/20202:06 PM

## 2018-12-28 DIAGNOSIS — I10 Essential (primary) hypertension: Secondary | ICD-10-CM | POA: Diagnosis not present

## 2018-12-28 DIAGNOSIS — E119 Type 2 diabetes mellitus without complications: Secondary | ICD-10-CM | POA: Diagnosis not present

## 2018-12-28 DIAGNOSIS — Z113 Encounter for screening for infections with a predominantly sexual mode of transmission: Secondary | ICD-10-CM | POA: Diagnosis not present

## 2018-12-28 DIAGNOSIS — R5383 Other fatigue: Secondary | ICD-10-CM | POA: Diagnosis not present

## 2018-12-28 DIAGNOSIS — N765 Ulceration of vagina: Secondary | ICD-10-CM | POA: Diagnosis not present

## 2018-12-28 DIAGNOSIS — F331 Major depressive disorder, recurrent, moderate: Secondary | ICD-10-CM | POA: Diagnosis not present

## 2018-12-28 DIAGNOSIS — N951 Menopausal and female climacteric states: Secondary | ICD-10-CM | POA: Diagnosis not present

## 2018-12-28 DIAGNOSIS — E039 Hypothyroidism, unspecified: Secondary | ICD-10-CM | POA: Diagnosis not present

## 2018-12-29 ENCOUNTER — Ambulatory Visit: Payer: Medicare Other | Admitting: Orthopaedic Surgery

## 2019-01-25 ENCOUNTER — Encounter (INDEPENDENT_AMBULATORY_CARE_PROVIDER_SITE_OTHER): Payer: Self-pay | Admitting: *Deleted

## 2019-01-31 ENCOUNTER — Other Ambulatory Visit: Payer: Self-pay

## 2019-01-31 ENCOUNTER — Encounter (HOSPITAL_COMMUNITY): Payer: Self-pay | Admitting: Emergency Medicine

## 2019-01-31 ENCOUNTER — Emergency Department (HOSPITAL_COMMUNITY): Payer: Medicare Other

## 2019-01-31 ENCOUNTER — Emergency Department (HOSPITAL_COMMUNITY)
Admission: EM | Admit: 2019-01-31 | Discharge: 2019-01-31 | Disposition: A | Discharge status: Home / Self Care | Payer: Medicare Other | Attending: Emergency Medicine | Admitting: Emergency Medicine

## 2019-01-31 DIAGNOSIS — R0600 Dyspnea, unspecified: Secondary | ICD-10-CM | POA: Diagnosis not present

## 2019-01-31 DIAGNOSIS — Z7982 Long term (current) use of aspirin: Secondary | ICD-10-CM | POA: Diagnosis not present

## 2019-01-31 DIAGNOSIS — Z79899 Other long term (current) drug therapy: Secondary | ICD-10-CM | POA: Diagnosis not present

## 2019-01-31 DIAGNOSIS — J069 Acute upper respiratory infection, unspecified: Secondary | ICD-10-CM | POA: Insufficient documentation

## 2019-01-31 DIAGNOSIS — J4521 Mild intermittent asthma with (acute) exacerbation: Secondary | ICD-10-CM | POA: Diagnosis not present

## 2019-01-31 DIAGNOSIS — R0602 Shortness of breath: Secondary | ICD-10-CM | POA: Diagnosis not present

## 2019-01-31 DIAGNOSIS — I5032 Chronic diastolic (congestive) heart failure: Secondary | ICD-10-CM | POA: Insufficient documentation

## 2019-01-31 DIAGNOSIS — J45901 Unspecified asthma with (acute) exacerbation: Secondary | ICD-10-CM | POA: Diagnosis not present

## 2019-01-31 DIAGNOSIS — M791 Myalgia, unspecified site: Secondary | ICD-10-CM | POA: Diagnosis not present

## 2019-01-31 DIAGNOSIS — E119 Type 2 diabetes mellitus without complications: Secondary | ICD-10-CM | POA: Diagnosis not present

## 2019-01-31 DIAGNOSIS — I110 Hypertensive heart disease with heart failure: Secondary | ICD-10-CM | POA: Diagnosis not present

## 2019-01-31 DIAGNOSIS — M549 Dorsalgia, unspecified: Secondary | ICD-10-CM | POA: Insufficient documentation

## 2019-01-31 DIAGNOSIS — J449 Chronic obstructive pulmonary disease, unspecified: Secondary | ICD-10-CM | POA: Diagnosis not present

## 2019-01-31 DIAGNOSIS — Z7984 Long term (current) use of oral hypoglycemic drugs: Secondary | ICD-10-CM | POA: Insufficient documentation

## 2019-01-31 DIAGNOSIS — I10 Essential (primary) hypertension: Secondary | ICD-10-CM | POA: Diagnosis not present

## 2019-01-31 DIAGNOSIS — B9789 Other viral agents as the cause of diseases classified elsewhere: Secondary | ICD-10-CM | POA: Diagnosis not present

## 2019-01-31 DIAGNOSIS — R05 Cough: Secondary | ICD-10-CM | POA: Diagnosis present

## 2019-01-31 LAB — URINALYSIS, ROUTINE W REFLEX MICROSCOPIC
Bilirubin Urine: NEGATIVE
GLUCOSE, UA: 50 mg/dL — AB
Hgb urine dipstick: NEGATIVE
Ketones, ur: NEGATIVE mg/dL
Nitrite: NEGATIVE
Protein, ur: NEGATIVE mg/dL
Specific Gravity, Urine: 1.01 (ref 1.005–1.030)
pH: 6 (ref 5.0–8.0)

## 2019-01-31 LAB — CBC WITH DIFFERENTIAL/PLATELET
Abs Immature Granulocytes: 0.04 10*3/uL (ref 0.00–0.07)
BASOS PCT: 0 %
Basophils Absolute: 0 10*3/uL (ref 0.0–0.1)
EOS ABS: 0.3 10*3/uL (ref 0.0–0.5)
Eosinophils Relative: 2 %
HCT: 43.4 % (ref 36.0–46.0)
Hemoglobin: 14.4 g/dL (ref 12.0–15.0)
Immature Granulocytes: 0 %
Lymphocytes Relative: 17 %
Lymphs Abs: 2 10*3/uL (ref 0.7–4.0)
MCH: 30.1 pg (ref 26.0–34.0)
MCHC: 33.2 g/dL (ref 30.0–36.0)
MCV: 90.6 fL (ref 80.0–100.0)
Monocytes Absolute: 0.7 10*3/uL (ref 0.1–1.0)
Monocytes Relative: 6 %
Neutro Abs: 8.5 10*3/uL — ABNORMAL HIGH (ref 1.7–7.7)
Neutrophils Relative %: 75 %
PLATELETS: 236 10*3/uL (ref 150–400)
RBC: 4.79 MIL/uL (ref 3.87–5.11)
RDW: 12.9 % (ref 11.5–15.5)
WBC: 11.5 10*3/uL — ABNORMAL HIGH (ref 4.0–10.5)
nRBC: 0 % (ref 0.0–0.2)

## 2019-01-31 LAB — BASIC METABOLIC PANEL
Anion gap: 12 (ref 5–15)
BUN: 18 mg/dL (ref 6–20)
CO2: 24 mmol/L (ref 22–32)
Calcium: 8.4 mg/dL — ABNORMAL LOW (ref 8.9–10.3)
Chloride: 102 mmol/L (ref 98–111)
Creatinine, Ser: 1.31 mg/dL — ABNORMAL HIGH (ref 0.44–1.00)
GFR calc Af Amer: 55 mL/min — ABNORMAL LOW (ref >60)
GFR calc non Af Amer: 47 mL/min — ABNORMAL LOW (ref >60)
Glucose, Bld: 263 mg/dL — ABNORMAL HIGH (ref 70–99)
POTASSIUM: 3.6 mmol/L (ref 3.5–5.1)
Sodium: 138 mmol/L (ref 135–145)

## 2019-01-31 LAB — TROPONIN I

## 2019-01-31 LAB — INFLUENZA PANEL BY PCR (TYPE A & B)
Influenza A By PCR: NEGATIVE
Influenza B By PCR: NEGATIVE

## 2019-01-31 LAB — BRAIN NATRIURETIC PEPTIDE: B Natriuretic Peptide: 29 pg/mL (ref 0.0–100.0)

## 2019-01-31 MED ORDER — ALBUTEROL SULFATE (2.5 MG/3ML) 0.083% IN NEBU
5.0000 mg | INHALATION_SOLUTION | Freq: Once | RESPIRATORY_TRACT | Status: AC
  Administered 2019-01-31: 5 mg via RESPIRATORY_TRACT
  Filled 2019-01-31: qty 6

## 2019-01-31 MED ORDER — PREDNISONE 20 MG PO TABS: 40.0000 mg | ORAL_TABLET | Freq: Every day | ORAL | 0 refills | Status: DC

## 2019-01-31 MED ORDER — IPRATROPIUM-ALBUTEROL 0.5-2.5 (3) MG/3ML IN SOLN
3.0000 mL | Freq: Once | RESPIRATORY_TRACT | Status: AC
  Administered 2019-01-31: 3 mL via RESPIRATORY_TRACT
  Filled 2019-01-31: qty 3

## 2019-01-31 MED ORDER — ALBUTEROL SULFATE (2.5 MG/3ML) 0.083% IN NEBU
2.5000 mg | INHALATION_SOLUTION | Freq: Once | RESPIRATORY_TRACT | Status: AC
  Administered 2019-01-31: 2.5 mg via RESPIRATORY_TRACT
  Filled 2019-01-31: qty 3

## 2019-01-31 MED ORDER — METHOCARBAMOL 500 MG PO TABS: 1000.0000 mg | ORAL_TABLET | Freq: Four times a day (QID) | ORAL | 0 refills | Status: DC | PRN

## 2019-01-31 MED ORDER — IOPAMIDOL (ISOVUE-370) INJECTION 76%
75.0000 mL | Freq: Once | INTRAVENOUS | Status: AC | PRN
  Administered 2019-01-31: 75 mL via INTRAVENOUS

## 2019-01-31 MED ORDER — ALBUTEROL SULFATE (2.5 MG/3ML) 0.083% IN NEBU: 2.5000 mg | INHALATION_SOLUTION | RESPIRATORY_TRACT | 0 refills | Status: DC | PRN

## 2019-01-31 MED ORDER — SODIUM CHLORIDE 0.9 % IV BOLUS
500.0000 mL | Freq: Once | INTRAVENOUS | Status: AC
  Administered 2019-01-31: 500 mL via INTRAVENOUS

## 2019-01-31 NOTE — ED Triage Notes (Addendum)
Patient complains of cough and fever x 4 days. States shortness of breath and history of asthma and CHF.

## 2019-01-31 NOTE — ED Notes (Signed)
Respiratory paged at this time for tx.  

## 2019-01-31 NOTE — ED Provider Notes (Signed)
[facility] EMERGENCY DEPARTMENT Provider Note   CSN: [id number] Arrival date & time: 01/31/19  1148    History   Chief Complaint Chief Complaint  Patient presents with  . Cough  . Fever  . Shortness of Breath    HPI Erin Sexton is a 48 y.o. female.     HPI  Pt was seen at 1320.  Per pt, c/o gradual onset and worsening of persistent cough, wheezing and SOB for the past 4 days. Has been associated with chills and right sided thoracic back "pain" which worsens when she coughs. Symptoms have been constant for the past 4 days.  Has been using home MDI with transient relief.  Denies CP/palpitations, no neck pain, no abd pain, no N/V/D, no objeective fevers, no rash, no sore throat.    Past Medical History:  Diagnosis Date  . Abnormal CT scan    a. 02/2014: evaluated for dyspnea and initially placed on Xarelto for PE because radiologist could not exclude small peripheral pulmonary emboli because of limited contrast. However, after admission for ++vaginal bleeding several days later, it was felt she did NOT have PE - underwent further testing with neg VQ 03/06/14, neg LE duplex.  . Anemia   . Anxiety   . Asthma   . Blood transfusion without reported diagnosis   . Bone spur of right foot   . CHF (congestive heart failure) ([place])   . COPD (chronic obstructive pulmonary disease) ([place])   . Depression   . Diabetes mellitus without complication ([place])    a. Dx 02/2014.  [name] History of echocardiogram 2015   normal EF, Grade II diastolic dysfunction  . HOCM (hypertrophic obstructive cardiomyopathy) ([place])    a. Dx 93810.  [name] Hypertension   . Morbid obesity ([place])   . NSVT (nonsustained ventricular tachycardia) ([place])    a. Noted on tele during 02/2014 adm for symptomatic anemia.  [name] SVT (supraventricular tachycardia) ([place])    a. Noted on tele during 02/2014 adm for symptomatic anemia.  . Vaginal bleeding     Patient Active Problem List   Diagnosis Date Noted  . Facial droop 06/20/2016   . TIA (transient ischemic attack) 06/20/2016  . Chronic diastolic CHF (congestive heart failure) ([place]) 06/20/2016  . Complex endometrial hyperplasia without atypia 06/08/2014  . Menorrhagia 06/01/2014  . Physical deconditioning 05/25/2014  . Catheter-associated urinary tract infection ([place]) 05/20/2014  . Hypokalemia 05/20/2014  . Acute on chronic diastolic congestive heart failure ([place]) 05/18/2014  . Diabetes mellitus type 2, noninsulin dependent ([place])   . OSA (obstructive sleep apnea) 03/21/2014  . Vaginal bleeding 03/06/2014  . Acute blood loss anemia 03/06/2014  . HOCM (hypertrophic obstructive cardiomyopathy) ([place]) 03/06/2014  . Morbid obesity ([place]) 03/02/2014  . Essential hypertension, benign 03/02/2014  . Asthma, chronic 03/02/2014    Past Surgical History:  Procedure Laterality Date  . DILITATION & CURRETTAGE/HYSTROSCOPY WITH NOVASURE ABLATION N/A 08/07/2016   Procedure: DILATATION & CURETTAGE/HYSTEROSCOPY WITH ATTPEMPTED NOVASURE ABLATION AND INSERTION OF IUD;  Surgeon: Florian Buff, MD;  Location: [facility];  Service: Gynecology;  Laterality: N/A;     OB History    Gravida      Para      Term      Preterm      AB      Living  0     SAB      TAB      Ectopic      Multiple      Live  Births               Home Medications    Prior to Admission medications   Medication Sig Start Date End Date Taking? Authorizing Provider  acetaminophen (TYLENOL) 325 MG tablet Take 2 tablets (650 mg total) by mouth every 6 (six) hours as needed for mild pain or moderate pain. Patient not taking: Reported on 12/15/2018 11/07/16   Waynetta Pean, PA-C  albuterol (PROVENTIL) (5 MG/ML) 0.5% nebulizer solution Take 0.5 mLs (2.5 mg total) by nebulization every 6 (six) hours as needed for wheezing or shortness of breath. Patient not taking: Reported on 12/15/2018 02/22/17   Julianne Rice, MD  Albuterol Sulfate (PROAIR HFA IN) Inhale into the lungs.    [provider]    aspirin EC 81 MG EC tablet Take 1 tablet (81 mg total) by mouth daily. Patient not taking: Reported on 12/15/2018 06/21/16   Isaac Bliss, Rayford Halsted, MD  atorvastatin (LIPITOR) 80 MG tablet TK 1 T PO D 01/25/16   [provider]  buPROPion (WELLBUTRIN SR) 150 MG 12 hr tablet TAKE ONE TABLET BY MOUTH TWICE DAILY 06/01/15   [provider]  Cholecalciferol (VITAMIN D PO) Take 1 capsule by mouth daily.    [provider]  Diclofenac Sodium 3 % GEL Place 1 application onto the skin 2 (two) times daily. To affected area. Patient not taking: Reported on 12/15/2018 11/07/16   Waynetta Pean, PA-C  lisinopril (PRINIVIL,ZESTRIL) 5 MG tablet Take 2.5 mg by mouth daily.    [provider]  metFORMIN (GLUCOPHAGE) 500 MG tablet Take 1 tablet (500 mg total) by mouth 2 (two) times daily with a meal. 05/27/14   Angiulli, Lavon Paganini, PA-C  metoprolol (LOPRESSOR) 50 MG tablet Take 50 mg by mouth 2 (two) times daily.  11/16/14   [provider]  potassium chloride SA (K-DUR,KLOR-CON) 20 MEQ tablet Take 20 mEq by mouth 2 (two) times daily.     [provider]  thyroid (NP THYROID) 120 MG tablet Take 120 mg by mouth daily before breakfast.    [provider]  torsemide (DEMADEX) 20 MG tablet Take 1 tablet (20 mg total) by mouth daily. Patient not taking: Reported on 12/15/2018 05/01/15   Herminio Commons, MD    Family History Family History  Problem Relation Age of Onset  . Diabetes Mother   . Arthritis Mother   . Other Mother        WPW  . Mental illness Mother   . Mental illness Sister   . Uterine cancer Maternal Grandmother   . Mental illness Maternal Grandmother   . CAD Neg Hx        No family history of EARLY CAD    Social History Social History   Tobacco Use  . Smoking status: Never Smoker  . Smokeless tobacco: Never Used  Substance Use Topics  . Alcohol use: No  . Drug use: No     Allergies   Patient has no known  allergies.   Review of Systems Review of Systems ROS: Statement: All systems negative except as marked or noted in the HPI; Constitutional: Negative for fever and chills. ; ; Eyes: Negative for eye pain, redness and discharge. ; ; ENMT: Negative for ear pain, hoarseness, nasal congestion, sinus pressure and sore throat. ; ; Cardiovascular: Negative for chest pain, palpitations, diaphoresis, and peripheral edema. ; ; Respiratory: +cough, wheezing, SOB. Negative for stridor. ; ; Gastrointestinal: Negative for nausea, vomiting, diarrhea, abdominal  pain, blood in stool, hematemesis, jaundice and rectal bleeding. . ; ; Genitourinary: Negative for dysuria, flank pain and hematuria. ; ; Musculoskeletal: +right thoracic back pain. Negative for neck pain. Negative for swelling and trauma.; ; Skin: Negative for pruritus, rash, abrasions, blisters, bruising and skin lesion.; ; Neuro: Negative for headache, lightheadedness and neck stiffness. Negative for weakness, altered level of consciousness, altered mental status, extremity weakness, paresthesias, involuntary movement, seizure and syncope.       Physical Exam Updated Vital Signs BP (!) 154/98 (BP Location: Right Arm)   Pulse 77   Temp 99.4 F (37.4 C) (Oral)   Resp 18   Ht 5\' 9"  (1.753 m)   Wt (!) 157.9 kg   LMP 06/04/2014   SpO2 96%   BMI 51.39 kg/m    Patient Vitals for the past 24 hrs:  BP Temp Temp src Pulse Resp SpO2 Height Weight  01/31/19 1430 131/77 98 F (36.7 C) Oral 91 18 94 % - -  01/31/19 1411 - - - - - 96 % - -  01/31/19 1400 128/78 - - 85 16 96 % - -  01/31/19 1314 - - - - - 96 % - -  01/31/19 1207 - - - - - - 5\' 9"  (1.753 m) (!) 157.9 kg  01/31/19 1206 (!) 154/98 99.4 F (37.4 C) Oral 77 18 96 % - -     Physical Exam 1325: Physical examination:  Nursing notes reviewed; Vital signs and O2 SAT reviewed;  Constitutional: Well developed, Well nourished, Well hydrated, In no acute distress; Head:  Normocephalic, atraumatic;  Eyes: EOMI, PERRL, No scleral icterus; ENMT: TM's clear bilat. +edemetous nasal turbinates bilat with clear rhinorrhea. Mouth and pharynx without lesions. No tonsillar exudates. No intra-oral edema. No submandibular or sublingual edema. No hoarse voice, no drooling, no stridor. No pain with manipulation of larynx. No trismus. Mouth and pharynx normal, Mucous membranes moist; Neck: Supple, Full range of motion, No lymphadenopathy; Cardiovascular: Regular rate and rhythm, No gallop; Respiratory: Breath sounds coarse & equal bilaterally, No wheezes.  Speaking full sentences with ease, Normal respiratory effort/excursion; Chest: Nontender, Movement normal; Abdomen: Soft, Nontender, Nondistended, Normal bowel sounds; Genitourinary: No CVA tenderness; Extremities: Peripheral pulses normal, No tenderness, No edema, No calf edema or asymmetry.; Neuro: AA&Ox3, Major CN grossly intact.  Speech clear. No gross focal motor or sensory deficits in extremities.; Skin: Color normal, Warm, Dry.   ED Treatments / Results  Labs (all labs ordered are listed, but only abnormal results are displayed)   EKG EKG Interpretation  Date/Time:  Sunday January 31 2019 12:10:53 EST Ventricular Rate:  76 PR Interval:  156 QRS Duration: 102 QT Interval:  404 QTC Calculation: 332 R Axis:   -8 Text Interpretation:  Normal sinus rhythm Minimal voltage criteria for LVH, may be normal variant Septal infarct , age undetermined Baseline wander When compared with ECG of 02/22/2017 No significant change was found Confirmed by Francine Graven [phone]) on 01/31/2019 1:30:03 PM   Radiology   Procedures Procedures (including critical care time)  Medications Ordered in ED Medications  ipratropium-albuterol (DUONEB) 0.5-2.5 (3) MG/3ML nebulizer solution 3 mL (has no administration in time range)  albuterol (PROVENTIL) (2.5 MG/3ML) 0.083% nebulizer solution 2.5 mg (has no administration in time range)  albuterol (PROVENTIL) (2.5  MG/3ML) 0.083% nebulizer solution 5 mg (5 mg Nebulization Given 01/31/19 1314)     Initial Impression / Assessment and Plan / ED Course  I have reviewed the triage vital signs and the  nursing notes.  Pertinent labs & imaging results that were available during my care of the patient were reviewed by me and considered in my medical decision making (see chart for details).    MDM Reviewed: previous chart, nursing note and vitals Reviewed previous: labs and ECG Interpretation: labs, ECG, x-ray and CT scan   Results for orders placed or performed during the hospital encounter of 01/31/19  Influenza panel by PCR (type A & B)  Result Value Ref Range   Influenza A By PCR NEGATIVE NEGATIVE   Influenza B By PCR NEGATIVE NEGATIVE  Basic metabolic panel  Result Value Ref Range   Sodium 138 135 - 145 mmol/L   Potassium 3.6 3.5 - 5.1 mmol/L   Chloride 102 98 - 111 mmol/L   CO2 24 22 - 32 mmol/L   Glucose, Bld 263 (H) 70 - 99 mg/dL   BUN 18 6 - 20 mg/dL   Creatinine, Ser 1.31 (H) 0.44 - 1.00 mg/dL   Calcium 8.4 (L) 8.9 - 10.3 mg/dL   GFR calc non Af Amer 47 (L) >60 mL/min   GFR calc Af Amer 55 (L) >60 mL/min   Anion gap 12 5 - 15  Brain natriuretic peptide  Result Value Ref Range   B Natriuretic Peptide 29.0 0.0 - 100.0 pg/mL  Troponin I - Once  Result Value Ref Range   Troponin I <0.03 <0.03 ng/mL  CBC with Differential  Result Value Ref Range   WBC 11.5 (H) 4.0 - 10.5 K/uL   RBC 4.79 3.87 - 5.11 MIL/uL   Hemoglobin 14.4 12.0 - 15.0 g/dL   HCT 43.4 36.0 - 46.0 %   MCV 90.6 80.0 - 100.0 fL   MCH 30.1 26.0 - 34.0 pg   MCHC 33.2 30.0 - 36.0 g/dL   RDW 12.9 11.5 - 15.5 %   Platelets 236 150 - 400 K/uL   nRBC 0.0 0.0 - 0.2 %   Neutrophils Relative % 75 %   Neutro Abs 8.5 (H) 1.7 - 7.7 K/uL   Lymphocytes Relative 17 %   Lymphs Abs 2.0 0.7 - 4.0 K/uL   Monocytes Relative 6 %   Monocytes Absolute 0.7 0.1 - 1.0 K/uL   Eosinophils Relative 2 %   Eosinophils Absolute 0.3 0.0 - 0.5  K/uL   Basophils Relative 0 %   Basophils Absolute 0.0 0.0 - 0.1 K/uL   Immature Granulocytes 0 %   Abs Immature Granulocytes 0.04 0.00 - 0.07 K/uL  Urinalysis, Routine w reflex microscopic  Result Value Ref Range   Color, Urine YELLOW YELLOW   APPearance CLEAR CLEAR   Specific Gravity, Urine 1.010 1.005 - 1.030   pH 6.0 5.0 - 8.0   Glucose, UA 50 (A) NEGATIVE mg/dL   Hgb urine dipstick NEGATIVE NEGATIVE   Bilirubin Urine NEGATIVE NEGATIVE   Ketones, ur NEGATIVE NEGATIVE mg/dL   Protein, ur NEGATIVE NEGATIVE mg/dL   Nitrite NEGATIVE NEGATIVE   Leukocytes,Ua TRACE (A) NEGATIVE   RBC / HPF 0-5 0 - 5 RBC/hpf   WBC, UA 0-5 0 - 5 WBC/hpf   Bacteria, UA RARE (A) NONE SEEN   Squamous Epithelial / LPF 0-5 0 - 5   Dg Chest 2 View Result Date: 01/31/2019 CLINICAL DATA:  Difficulty breathing for 4 days EXAM: CHEST - 2 VIEW COMPARISON:  02/22/2017 FINDINGS: Normal heart size. Lungs clear. No pneumothorax. No pleural effusion. IMPRESSION: No active cardiopulmonary disease. Electronically Signed   By: Marybelle Killings M.D.   On:  01/31/2019 13:12   Ct Angio Chest Pe W/cm &/or Wo Cm Result Date: 01/31/2019 CLINICAL DATA:  Shortness of breath EXAM: CT ANGIOGRAPHY CHEST WITH CONTRAST TECHNIQUE: Multidetector CT imaging of the chest was performed using the standard protocol during bolus administration of intravenous contrast. Multiplanar CT image reconstructions and MIPs were obtained to evaluate the vascular anatomy. CONTRAST:  34mL ISOVUE-370 IOPAMIDOL (ISOVUE-370) INJECTION 76% COMPARISON:  04/30/2016 FINDINGS: Cardiovascular: No filling defects in the pulmonary arteries to suggest pulmonary emboli. Heart is normal size. Aorta is normal caliber. Retroesophageal right subclavian artery noted. Mediastinum/Nodes: Scattered small mediastinal lymph nodes. No mediastinal, hilar, or axillary adenopathy. Lungs/Pleura: Lungs are clear. No focal airspace opacities or suspicious nodules. No effusions. Upper Abdomen:  Imaging into the upper abdomen shows no acute findings. Musculoskeletal: Chest wall soft tissues are unremarkable. No acute bony abnormality. Review of the MIP images confirms the above findings. IMPRESSION: No evidence of pulmonary embolus. No acute cardiopulmonary disease. Electronically Signed   By: Rolm Baptise M.D.   On: 01/31/2019 15:42    1550:  BUN/Cr per baseline. Workup reassuring. Judicious IVF and short nebs x2 given with improvement of wheezing. Pt ambulated with O2 Sats remaining 92-95% R/A, resps easy, NAD. Pt denied SOB. Pt states she feels better now and is ready to go home. No clear indication for admission at this time. Tx symptomatically, f/u PMD. Dx and testing d/w pt and family.  Questions answered.  Verb understanding, agreeable to d/c home with outpt f/u.    Final Clinical Impressions(s) / ED Diagnoses   Final diagnoses:  None    ED Discharge Orders    None       Francine Graven, DO 02/05/19 0034

## 2019-01-31 NOTE — ED Notes (Signed)
Pt ambulated down the hall and back with no assistance. O2 sat stayed 92-95%. Pt denied SOB

## 2019-01-31 NOTE — Discharge Instructions (Signed)
Take the prescriptions as directed.  Use your albuterol inhaler (2 to 4 puffs) or your albuterol nebulizer (1 unit dose) every 4 hours for the next 7 days, then as needed for cough, wheezing, or shortness of breath. Apply moist heat or ice to the area(s) of discomfort, for 15 minutes at a time, several times per day for the next few days.  Do not fall asleep on a heating or ice pack.  Call your regular medical doctor tomorrow to schedule a follow up appointment this week.  Return to the Emergency Department immediately if worsening.

## 2019-02-03 DIAGNOSIS — J209 Acute bronchitis, unspecified: Secondary | ICD-10-CM | POA: Diagnosis not present

## 2019-02-03 DIAGNOSIS — E119 Type 2 diabetes mellitus without complications: Secondary | ICD-10-CM | POA: Diagnosis not present

## 2019-02-03 DIAGNOSIS — E039 Hypothyroidism, unspecified: Secondary | ICD-10-CM | POA: Diagnosis not present

## 2019-02-03 DIAGNOSIS — I10 Essential (primary) hypertension: Secondary | ICD-10-CM | POA: Diagnosis not present
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
## 2019-02-10 NOTE — Telephone Encounter (Signed)
Note sent to nurse. 

## 2019-02-15 ENCOUNTER — Ambulatory Visit (INDEPENDENT_AMBULATORY_CARE_PROVIDER_SITE_OTHER): Payer: Medicare Other | Admitting: Psychiatry

## 2019-02-15 ENCOUNTER — Encounter (HOSPITAL_COMMUNITY): Payer: Self-pay | Admitting: Psychiatry

## 2019-02-15 VITALS — BP 114/74 | HR 78 | Ht 69.0 in | Wt 353.0 lb

## 2019-02-15 DIAGNOSIS — F4310 Post-traumatic stress disorder, unspecified: Secondary | ICD-10-CM

## 2019-02-15 DIAGNOSIS — F331 Major depressive disorder, recurrent, moderate: Secondary | ICD-10-CM | POA: Diagnosis not present

## 2019-02-15 MED ORDER — BUPROPION HCL ER (XL) 300 MG PO TB24: 300.0000 mg | ORAL_TABLET | Freq: Every day | ORAL | 1 refills | Status: DC

## 2019-02-15 MED ORDER — BUPROPION HCL ER (XL) 150 MG PO TB24: 300.0000 mg | ORAL_TABLET | Freq: Every day | ORAL | 1 refills | Status: DC

## 2019-02-15 NOTE — Patient Instructions (Signed)
1. Increase bupropion 300 mg daily  2. Return to clinic in one month for 30 mins 3. Referral to therapy

## 2019-02-17 ENCOUNTER — Ambulatory Visit (HOSPITAL_COMMUNITY): Payer: Medicare Other | Admitting: Licensed Clinical Social Worker

## 2019-03-04 ENCOUNTER — Encounter (INDEPENDENT_AMBULATORY_CARE_PROVIDER_SITE_OTHER): Payer: Self-pay | Admitting: Internal Medicine
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
## 2019-03-12 ENCOUNTER — Ambulatory Visit (INDEPENDENT_AMBULATORY_CARE_PROVIDER_SITE_OTHER): Payer: Self-pay | Admitting: Internal Medicine

## 2019-03-12 DIAGNOSIS — E039 Hypothyroidism, unspecified: Secondary | ICD-10-CM | POA: Diagnosis not present

## 2019-03-12 DIAGNOSIS — F331 Major depressive disorder, recurrent, moderate: Secondary | ICD-10-CM | POA: Diagnosis not present

## 2019-03-12 DIAGNOSIS — E119 Type 2 diabetes mellitus without complications: Secondary | ICD-10-CM | POA: Diagnosis not present

## 2019-03-18 ENCOUNTER — Encounter (HOSPITAL_COMMUNITY): Payer: Medicare Other | Admitting: Psychiatry

## 2019-03-18 ENCOUNTER — Other Ambulatory Visit: Payer: Self-pay

## 2019-03-18 DIAGNOSIS — N951 Menopausal and female climacteric states: Secondary | ICD-10-CM | POA: Diagnosis not present

## 2019-03-18 DIAGNOSIS — E559 Vitamin D deficiency, unspecified: Secondary | ICD-10-CM | POA: Diagnosis not present

## 2019-03-18 DIAGNOSIS — R002 Palpitations: Secondary | ICD-10-CM | POA: Diagnosis not present

## 2019-03-18 DIAGNOSIS — E119 Type 2 diabetes mellitus without complications: Secondary | ICD-10-CM | POA: Diagnosis not present

## 2019-03-18 DIAGNOSIS — E039 Hypothyroidism, unspecified: Secondary | ICD-10-CM | POA: Diagnosis not present

## 2019-03-18 DIAGNOSIS — I10 Essential (primary) hypertension: Secondary | ICD-10-CM | POA: Diagnosis not present

## 2019-03-19 NOTE — Progress Notes (Signed)
This encounter was created in error - please disregard.

## 2019-03-24 ENCOUNTER — Ambulatory Visit (INDEPENDENT_AMBULATORY_CARE_PROVIDER_SITE_OTHER): Payer: Medicare Other | Admitting: Licensed Clinical Social Worker

## 2019-03-24 ENCOUNTER — Ambulatory Visit (HOSPITAL_COMMUNITY): Payer: Medicare Other | Admitting: Licensed Clinical Social Worker

## 2019-03-24 ENCOUNTER — Encounter (HOSPITAL_COMMUNITY): Payer: Self-pay | Admitting: Licensed Clinical Social Worker

## 2019-03-24 ENCOUNTER — Other Ambulatory Visit: Payer: Self-pay

## 2019-03-24 DIAGNOSIS — F331 Major depressive disorder, recurrent, moderate: Secondary | ICD-10-CM

## 2019-03-24 DIAGNOSIS — Z62810 Personal history of physical and sexual abuse in childhood: Secondary | ICD-10-CM | POA: Diagnosis not present
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
## 2019-03-31 DIAGNOSIS — E559 Vitamin D deficiency, unspecified: Secondary | ICD-10-CM | POA: Diagnosis not present

## 2019-03-31 DIAGNOSIS — N951 Menopausal and female climacteric states: Secondary | ICD-10-CM | POA: Diagnosis not present

## 2019-04-01 ENCOUNTER — Telehealth: Payer: Self-pay | Admitting: *Deleted

## 2019-04-01 ENCOUNTER — Telehealth: Payer: Self-pay | Admitting: Obstetrics & Gynecology

## 2019-04-01 NOTE — Telephone Encounter (Signed)
Spoke with pt. Pt has Mirena IUD. Pt is showing menopausal signs. Pt's family dr wants her to start Progesterone 200 mg. Pt know that Mirena has Progesterone in it and she don't want to take to much. She hasn't started med by mouth yet. Please advise. Thanks!! [place]

## 2019-04-01 NOTE — Telephone Encounter (Signed)
Sounds like she need to make a webex or televisit in order fo rme to give an opinion  Yeah her appointment would have to have been at least 6 years ago  So without an encounter of some sort I cannot give an opinion

## 2019-04-01 NOTE — Telephone Encounter (Signed)
Spoke with pt. Advised to schedule a webex visit with Dr. Elonda Husky. Pt voiced understanding and will schedule appt. [place]

## 2019-04-01 NOTE — Telephone Encounter (Signed)
Patient called for Erin Sexton, per Erin Sexton, the patient needs a tele/webex visit.  Explained that to the patient, she wants to talk to [name].  [phone]

## 2019-04-01 NOTE — Telephone Encounter (Signed)
Left message to call and schedule a televisit or a webex visit to discuss further per Dr. Elonda Husky. [place]

## 2019-04-05 ENCOUNTER — Encounter: Payer: Self-pay | Admitting: *Deleted

## 2019-04-06 ENCOUNTER — Other Ambulatory Visit: Payer: Self-pay

## 2019-04-06 ENCOUNTER — Ambulatory Visit (INDEPENDENT_AMBULATORY_CARE_PROVIDER_SITE_OTHER): Payer: Medicare Other | Admitting: Obstetrics & Gynecology

## 2019-04-06 ENCOUNTER — Encounter: Payer: Self-pay | Admitting: Obstetrics & Gynecology

## 2019-04-06 DIAGNOSIS — N951 Menopausal and female climacteric states: Secondary | ICD-10-CM | POA: Diagnosis not present

## 2019-04-06 MED ORDER — ESTRADIOL 2 MG PO TABS: 2.0000 mg | ORAL_TABLET | Freq: Every day | ORAL | 11 refills | Status: DC

## 2019-04-06 NOTE — Progress Notes (Signed)
Patient ID: Erin Sexton, female   DOB: [birth year], 48 y.o.   MRN: [id number]   TELEHEALTH VIRTUAL GYNECOLOGY VISIT ENCOUNTER NOTE  I connected with Erin Sexton on 05/18/19 at 10:15 AM EDT by telephone at home and verified that I am speaking with the correct person using two identifiers.   I discussed the limitations, risks, security and privacy concerns of performing an evaluation and management service by telephone and the availability of in person appointments. I also discussed with the patient that there may be a patient responsible charge related to this service. The patient expressed understanding and agreed to proceed.   History:  Erin Sexton is a 48 y.o. G0P0 female being evaluated today for menopausal symptoms issues. She denies any abnormal vaginal discharge, bleeding, pelvic pain or other concerns.    She has a mirena IUD      Past Medical History:  Diagnosis Date  . Abnormal CT scan    a. 02/2014: evaluated for dyspnea and initially placed on Xarelto for PE because radiologist could not exclude small peripheral pulmonary emboli because of limited contrast. However, after admission for ++vaginal bleeding several days later, it was felt she did NOT have PE - underwent further testing with neg VQ 03/06/14, neg LE duplex.  . Anemia   . Anxiety   . Asthma   . Blood transfusion without reported diagnosis   . Bone spur of right foot   . CHF (congestive heart failure) ([place])   . COPD (chronic obstructive pulmonary disease) ([place])   . Depression   . Diabetes mellitus without complication ([place])    a. Dx 02/2014.  [name] History of echocardiogram 2015   normal EF, Grade II diastolic dysfunction  . HOCM (hypertrophic obstructive cardiomyopathy) ([place])    a. Dx 73710.  [name] Hypertension   . Morbid obesity ([place])   . NSVT (nonsustained ventricular tachycardia) ([place])    a. Noted on tele during 02/2014 adm for symptomatic anemia.  [name] SVT (supraventricular tachycardia) ([place])    a. Noted  on tele during 02/2014 adm for symptomatic anemia.  . Vaginal bleeding    Past Surgical History:  Procedure Laterality Date  . DILITATION & CURRETTAGE/HYSTROSCOPY WITH NOVASURE ABLATION N/A 08/07/2016   Procedure: DILATATION & CURETTAGE/HYSTEROSCOPY WITH ATTPEMPTED NOVASURE ABLATION AND INSERTION OF IUD;  Surgeon: Florian Buff, MD;  Location: [facility];  Service: Gynecology;  Laterality: N/A;   The following portions of the patient's history were reviewed and updated as appropriate: allergies, current medications, past family history, past medical history, past social history, past surgical history and problem list.   Health Maintenance:  Normal pap and negative HRHPV on .  Normal mammogram on .   Review of Systems:  Pertinent items noted in HPI and remainder of comprehensive ROS otherwise negative.  Physical Exam:  Physical exam deferred due to nature of the encounter  Labs and Imaging No results found for this or any previous visit (from the past 336 hour(s)). No results found.     Meds ordered this encounter  Medications  . estradiol (ESTRACE) 2 MG tablet    Sig: Take 1 tablet (2 mg total) by mouth daily.    Dispense:  30 tablet    Refill:  11    No orders of the defined types were placed in this encounter.   Assessment and Plan:     Menopausal symptom - begin oral estradiol 2 mg, has mirena IUD in place for endometrial protection/suppression  I discussed the assessment and treatment plan with the patient. The patient was provided an opportunity to ask questions and all were answered. The patient agreed with the plan and demonstrated an understanding of the instructions.   The patient was advised to call back or seek an in-person evaluation/go to the ED if the symptoms worsen or if the condition fails to improve as anticipated.  I provided 12 minutes of non-face-to-face time during this encounter.   Florian Buff, [place] for [facility]

## 2019-04-14 ENCOUNTER — Telehealth (HOSPITAL_COMMUNITY): Payer: Self-pay | Admitting: Psychiatry

## 2019-04-14 MED ORDER — BUPROPION HCL ER (XL) 300 MG PO TB24: 300.0000 mg | ORAL_TABLET | Freq: Every day | ORAL | 0 refills | Status: DC

## 2019-04-14 NOTE — Telephone Encounter (Signed)
LVM per provider: Ordered refill for bupropion per request. Please contact the patient to make follow up appointment. Will not plan to prescribe any refill without evaluation.

## 2019-04-14 NOTE — Telephone Encounter (Signed)
Ordered refill for bupropion per request. Please contact the patient to make follow up appointment. Will not plan to prescribe any refill without evaluation. 

## 2019-04-26 ENCOUNTER — Ambulatory Visit (INDEPENDENT_AMBULATORY_CARE_PROVIDER_SITE_OTHER): Payer: Medicare Other | Admitting: Licensed Clinical Social Worker

## 2019-04-26 ENCOUNTER — Other Ambulatory Visit: Payer: Self-pay

## 2019-04-26 DIAGNOSIS — F331 Major depressive disorder, recurrent, moderate: Secondary | ICD-10-CM

## 2019-04-27 ENCOUNTER — Encounter (HOSPITAL_COMMUNITY): Payer: Self-pay | Admitting: Licensed Clinical Social Worker
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
## 2019-05-06 DIAGNOSIS — N951 Menopausal and female climacteric states: Secondary | ICD-10-CM | POA: Diagnosis not present

## 2019-05-06 DIAGNOSIS — R21 Rash and other nonspecific skin eruption: Secondary | ICD-10-CM | POA: Diagnosis not present

## 2019-05-06 DIAGNOSIS — E559 Vitamin D deficiency, unspecified: Secondary | ICD-10-CM | POA: Diagnosis not present

## 2019-05-11 DIAGNOSIS — E559 Vitamin D deficiency, unspecified: Secondary | ICD-10-CM | POA: Diagnosis not present

## 2019-05-11 DIAGNOSIS — N951 Menopausal and female climacteric states: Secondary | ICD-10-CM | POA: Diagnosis not present

## 2019-05-17 ENCOUNTER — Encounter: Payer: Self-pay | Admitting: *Deleted

## 2019-05-18 ENCOUNTER — Other Ambulatory Visit: Payer: Self-pay

## 2019-05-18 ENCOUNTER — Encounter: Payer: Self-pay | Admitting: Obstetrics & Gynecology

## 2019-05-18 ENCOUNTER — Ambulatory Visit (INDEPENDENT_AMBULATORY_CARE_PROVIDER_SITE_OTHER): Payer: Medicare Other | Admitting: Obstetrics & Gynecology

## 2019-05-18 DIAGNOSIS — N951 Menopausal and female climacteric states: Secondary | ICD-10-CM | POA: Diagnosis not present

## 2019-05-18 NOTE — Progress Notes (Signed)
Webex VIRTUAL GYNECOLOGY VISIT ENCOUNTER NOTE  I connected with Erin Sexton on 05/18/19 at  2:00 PM EDT by webex at home and verified that I am speaking with the correct person using two identifiers.   I discussed the limitations, risks, security and privacy concerns of performing an evaluation and management service by telephone and the availability of in person appointments. I also discussed with the patient that there may be a patient responsible charge related to this service. The patient expressed understanding and agreed to proceed.   History:  Erin Sexton is a 48 y.o. G0P0 female being evaluated today for response to estradiol therapy for her menopausal symptoms. She denies any abnormal vaginal discharge, bleeding, pelvic pain or other concerns.    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  She has not experienced bleeding since  She will need to be using a Mirena IUD lifelong  She has begun to have menopausal symptoms and was begun on estradiol 2 mg 6 weeks ago She has had a 90% reduction in vasomotor symptoms with diminished severity as well  I would ordinarily want to use a transdermal delivery systme but her insurance will not cover it      Past Medical History:  Diagnosis Date  . Abnormal CT scan    a. 02/2014: evaluated for dyspnea and initially placed on Xarelto for PE because radiologist could not exclude small peripheral pulmonary emboli because of limited contrast. However, after admission for ++vaginal bleeding several days later, it was felt she did NOT have PE - underwent further testing with neg VQ 03/06/14, neg LE duplex.  . Anemia   . Anxiety   . Asthma   . Blood transfusion without reported diagnosis   . Bone spur of right foot   . CHF (congestive heart failure) ([place])   . COPD (chronic  obstructive pulmonary disease) ([place])   . Depression   . Diabetes mellitus without complication ([place])    a. Dx 02/2014.  [name] History of echocardiogram 2015   normal EF, Grade II diastolic dysfunction  . HOCM (hypertrophic obstructive cardiomyopathy) ([place])    a. Dx [address].  [name] Hypertension   . Morbid obesity ([place])   . NSVT (nonsustained ventricular tachycardia) ([place])    a. Noted on tele during 02/2014 adm for symptomatic anemia.  [name] SVT (supraventricular tachycardia) ([place])    a. Noted on tele during 02/2014 adm for symptomatic anemia.  . Vaginal bleeding    Past Surgical History:  Procedure Laterality Date  . DILITATION & CURRETTAGE/HYSTROSCOPY WITH NOVASURE ABLATION N/A 08/07/2016   Procedure: DILATATION & CURETTAGE/HYSTEROSCOPY WITH ATTPEMPTED NOVASURE ABLATION AND INSERTION OF IUD;  Surgeon: Florian Buff, MD;  Location: [facility];  Service: Gynecology;  Laterality: N/A;   The following portions of the patient's history were reviewed and updated as appropriate: allergies, current medications, past family history, past medical history, past social history, past surgical history and problem list.   Health Maintenance:  Normal pap and negative HRHPV on .  Normal mammogram on .   Review of Systems:  Pertinent items noted in HPI and remainder of comprehensive ROS otherwise negative.  Physical Exam:  Physical exam deferred due to nature of the encounter  Labs and Imaging No results found for this or any previous visit (from the past 336 hour(s)). No results found.  No orders of the defined types were placed in this encounter.   No orders of the defined types were placed in this encounter.   Assessment and Plan:     Menopausal symptom - excellent repsonse to estradiol 2 mg daily, on mirena IUD already        I discussed the assessment and treatment plan with the patient. The patient was provided an opportunity to ask questions and all were answered. The patient agreed with the plan and  demonstrated an understanding of the instructions.   The patient was advised to call back or seek an in-person evaluation/go to the ED if the symptoms worsen or if the condition fails to improve as anticipated.  I provided 15 minutes of non-face-to-face time during this encounter.   Florian Buff, [place] for [facility]

## 2019-05-27 ENCOUNTER — Ambulatory Visit (INDEPENDENT_AMBULATORY_CARE_PROVIDER_SITE_OTHER): Payer: Medicare Other | Admitting: Licensed Clinical Social Worker

## 2019-05-27 ENCOUNTER — Encounter (HOSPITAL_COMMUNITY): Payer: Self-pay | Admitting: Licensed Clinical Social Worker

## 2019-05-27 ENCOUNTER — Other Ambulatory Visit: Payer: Self-pay

## 2019-05-27 DIAGNOSIS — Z62810 Personal history of physical and sexual abuse in childhood: Secondary | ICD-10-CM

## 2019-05-27 DIAGNOSIS — F331 Major depressive disorder, recurrent, moderate: Secondary | ICD-10-CM

## 2019-05-27 NOTE — Progress Notes (Signed)
Virtual Visit via Video Note  I connected with [name] on 05/27/19 at  2:00 PM EDT by a video enabled telemedicine application and verified that I am speaking with the correct person using two identifiers.  Location: Patient: Home Provider: Office   I discussed the limitations of evaluation and management by telemedicine and the availability of in person appointments. The patient expressed understanding and agreed to proceed.  Participation Level: Active  Behavioral Response: CasualAlertDepressed  Type of Therapy: Individual Therapy  Treatment Goals addressed: Coping  Interventions: CBT and Solution Focused  Summary: Erin Sexton is a 48 y.o. female who presents oriented x5 (person, place, situation, time, and object), casually dressed, appropriately groomed, average height, average weight, and cooperative to address mood. Patient has a history of medical treatment including COPD, hypertension, and diabetes. Patient has minimal history of mental health treatment. Patient denies suicidal and homicidal ideations. Patient denies psychosis including auditory and visual hallucinations. Patient denies substance abuse. Patient is at low risk for lethality.  Physically: Patient is having difficulty with sleep. Patient she is napping during the day. Patient's appetite is limited. She has a vitamin D deficiency.  Spiritually/values: No issues identified.  Relationships: Patient is doing well with her relationships. She had to get "ugly" with him in a message and he has stopped contacting her.  Emotionally/Mentally/Behavior: Patient's mood was ok. Patient noted that she has been more emotional lately. She feels like it is due to hormones. Patient agreed to identify and examine her thoughts and behaviors on her "good days" that she can repeat. Patient is going to work on going outside to get Vitamin D and get out of the home.    Patient engaged in session. Patient responded well to  interventions. Patient continues to meet criteria MDD (Major depressive disorder), recurrent episode, moderate. Patient will continue in outpatient therapy due to being the least restrictive service to meet her needs. Patient made minimal progress on her goals.   Suicidal/Homicidal: Negativewithout intent/plan  Therapist Response: Therapist reviewed patient's recent thoughts and behaviors. Therapist utilized CBT to address mood. Therapist processed patient's feelings to identify triggers for mood. Therapist discussed patient's relationships and how she is maintaining healthy boundaries and how to maintain boundaries.  Plan: Return again in 4 weeks.  Diagnosis: Axis I: MDD (Major depressive disorder), recurrent episode, moderate    Axis II: No diagnosis  I discussed the assessment and treatment plan with the patient. The patient was provided an opportunity to ask questions and all were answered. The patient agreed with the plan and demonstrated an understanding of the instructions.   The patient was advised to call back or seek an in-person evaluation if the symptoms worsen or if the condition fails to improve as anticipated.  I provided 40 minutes of non-face-to-face time during this encounter.  Glori Bickers, LCSW 05/27/2019

## 2019-06-14 ENCOUNTER — Telehealth: Payer: Self-pay | Admitting: Obstetrics & Gynecology

## 2019-06-14 ENCOUNTER — Telehealth: Payer: Self-pay | Admitting: *Deleted

## 2019-06-14 NOTE — Telephone Encounter (Signed)
Yes take the progesterone orally

## 2019-06-14 NOTE — Telephone Encounter (Signed)
Pt aware of Dr. Eure's recommendations and voiced understanding. JSY 

## 2019-06-14 NOTE — Telephone Encounter (Signed)
Patient called with a question regarding medicine. Please advise  Patient states she forgot to ask Marcie Bal

## 2019-06-14 NOTE — Telephone Encounter (Signed)
Pt states that she is on estrogen and mirena and she started bleeding last Thursday. She states she has not has a menstrual cycle in 2 years. Requesting to speak with a nurse.

## 2019-06-14 NOTE — Telephone Encounter (Signed)
Spoke with pt. Pt is on Estradiol and has Mirena. Pt started bleeding Thursday, a real period. Pt thinks Estrogen has overtaken Progesterone. Pt has Progesterone tablets at home but has never taken them. Please advise. Thanks! [place]

## 2019-06-14 NOTE — Telephone Encounter (Signed)
Pt advised to continue taking Estradiol and add the oral Progesterone per Dr. Elonda Husky. Pt voiced understanding. Erin Sexton

## 2019-06-24 ENCOUNTER — Encounter (HOSPITAL_COMMUNITY): Payer: Self-pay | Admitting: Licensed Clinical Social Worker

## 2019-06-24 ENCOUNTER — Other Ambulatory Visit: Payer: Self-pay

## 2019-06-24 ENCOUNTER — Other Ambulatory Visit (HOSPITAL_COMMUNITY): Payer: Self-pay | Admitting: Internal Medicine

## 2019-06-24 ENCOUNTER — Ambulatory Visit (INDEPENDENT_AMBULATORY_CARE_PROVIDER_SITE_OTHER): Payer: Medicare Other | Admitting: Licensed Clinical Social Worker

## 2019-06-24 DIAGNOSIS — Z1231 Encounter for screening mammogram for malignant neoplasm of breast: Secondary | ICD-10-CM

## 2019-06-24 DIAGNOSIS — F331 Major depressive disorder, recurrent, moderate: Secondary | ICD-10-CM

## 2019-06-24 NOTE — Progress Notes (Signed)
Virtual Visit via Video Note  I connected with Erin Sexton on 06/24/19 at  2:00 PM EDT by a video enabled telemedicine application and verified that I am speaking with the correct person using two identifiers.  Location: Patient: Home Provider: Office   I discussed the limitations of evaluation and management by telemedicine and the availability of in person appointments. The patient expressed understanding and agreed to proceed.  Participation Level: Active  Behavioral Response: CasualAlertDepressed  Type of Therapy: Individual Therapy  Treatment Goals addressed: Coping  Interventions: CBT and Solution Focused  Summary: Erin Sexton is a 48 y.o. female who presents oriented x5 (person, place, situation, time, and object), casually dressed, appropriately groomed, average height, average weight, and cooperative to address mood. Patient has a history of medical treatment including COPD, hypertension, and diabetes. Patient has minimal history of mental health treatment. Patient denies suicidal and homicidal ideations. Patient denies psychosis including auditory and visual hallucinations. Patient denies substance abuse. Patient is at low risk for lethality.  Physically: Patient is walking several times a week. She is eating more fresh vegetables. She is losing weight and sleeping a little better.   Spiritually/values: No issues identified.  Relationships: Patient has reconnected with an old friend and they speak on a regular basis. This relationship has helped her with her mood.  Emotionally/Mentally/Behavior: Patient's mood has improved. She has been taking care of herself physically which has impacted her mood for the better. She has been talking to her friend on a regular basis which has helped her a lot.   Patient engaged in session. Patient responded well to interventions. Patient continues to meet criteria MDD (Major depressive disorder), recurrent episode, moderate. Patient  will continue in outpatient therapy due to being the least restrictive service to meet her needs. Patient made minimal progress on her goals.   Suicidal/Homicidal: Negativewithout intent/plan  Therapist Response: Therapist reviewed patient's recent thoughts and behaviors. Therapist utilized CBT to address mood. Therapist processed patient's feelings to identify triggers for mood. Therapist discussed patient's improved mood and the steps she is taking to improve how she feels.   Plan: Return again in 4 weeks.  Diagnosis: Axis I: MDD (Major depressive disorder), recurrent episode, moderate    Axis II: No diagnosis  I discussed the assessment and treatment plan with the patient. The patient was provided an opportunity to ask questions and all were answered. The patient agreed with the plan and demonstrated an understanding of the instructions.   The patient was advised to call back or seek an in-person evaluation if the symptoms worsen or if the condition fails to improve as anticipated.  I provided 40 minutes of non-face-to-face time during this encounter.  Glori Bickers, LCSW 06/24/2019

## 2019-06-30 ENCOUNTER — Telehealth: Payer: Self-pay | Admitting: Obstetrics and Gynecology

## 2019-06-30 ENCOUNTER — Telehealth: Payer: Self-pay | Admitting: Obstetrics & Gynecology

## 2019-06-30 NOTE — Telephone Encounter (Signed)
Phone call to Ms. Ellerman regarding her continued bleeding.  She is a 48 year old obese female with IUD in place due to endometrial hyperplasia with atypia on D&C.  She is also been added estradiol 2 mg/day for vasomotor symptoms and then began to spot a bit and was placed on Prometrium 2 try to control the bleeding the bleeding is been more significant since beginning the Prometrium.  Hemoglobin is 14 in February She has a follow-up ultrasound ordered in September due to the endometrial hyperplasia. My recommendations are to stop the Prometrium, expect the bleeding to be somewhat heavier and less predictable in the 2 weeks after the Prometrium is stopped. If the bleeding becomes more severe are a concern to the patient we could check a fingerstick hemoglobin, and move up the September ultrasound.  Patient to let us know next week how she is doing

## 2019-06-30 NOTE — Telephone Encounter (Signed)
Pt continues to have vaginal bleeding. Has gotten heavier than it was 2 weeks ago. Pt has Mirena IUD and is taking Estradiol 2mg  and Progesterone 200mg . Please advise. Thanks!! [place]

## 2019-06-30 NOTE — Telephone Encounter (Signed)
Pt states that she is still bleeding and is concerned because she usually becomes anemic with it.

## 2019-07-07 ENCOUNTER — Other Ambulatory Visit (INDEPENDENT_AMBULATORY_CARE_PROVIDER_SITE_OTHER): Payer: Self-pay | Admitting: *Deleted

## 2019-07-07 DIAGNOSIS — Z01810 Encounter for preprocedural cardiovascular examination: Secondary | ICD-10-CM | POA: Insufficient documentation

## 2019-07-07 DIAGNOSIS — Z1211 Encounter for screening for malignant neoplasm of colon: Secondary | ICD-10-CM | POA: Insufficient documentation

## 2019-07-19 ENCOUNTER — Telehealth: Payer: Self-pay | Admitting: Obstetrics & Gynecology

## 2019-07-19 MED ORDER — MEGESTROL ACETATE 40 MG PO TABS: ORAL_TABLET | ORAL | 3 refills | Status: DC

## 2019-07-23 ENCOUNTER — Other Ambulatory Visit: Payer: Self-pay | Admitting: *Deleted

## 2019-07-26 MED ORDER — ESTRADIOL 2 MG PO TABS: 2.0000 mg | ORAL_TABLET | Freq: Every day | ORAL | 3 refills | Status: DC

## 2019-07-29 ENCOUNTER — Telehealth: Payer: Self-pay | Admitting: Student

## 2019-07-29 NOTE — Telephone Encounter (Signed)
Virtual Visit Pre-Appointment Phone Call  "(Name), I am calling you today to discuss your upcoming appointment. We are currently trying to limit exposure to the virus that causes COVID-19 by seeing patients at home rather than in the office."  1. "What is the BEST phone number to call the day of the visit?" - include this in appointment notes  2. Do you have or have access to (through a family member/friend) a smartphone with video capability that we can use for your visit?" a. If yes - list this number in appt notes as cell (if different from BEST phone #) and list the appointment type as a VIDEO visit in appointment notes b. If no - list the appointment type as a PHONE visit in appointment notes  3. Confirm consent - "In the setting of the current Covid19 crisis, you are scheduled for a (phone or video) visit with your provider on (date) at (time).  Just as we do with many in-office visits, in order for you to participate in this visit, we must obtain consent.  If you'd like, I can send this to your mychart (if signed up) or email for you to review.  Otherwise, I can obtain your verbal consent now.  All virtual visits are billed to your insurance company just like a normal visit would be.  By agreeing to a virtual visit, we'd like you to understand that the technology does not allow for your provider to perform an examination, and thus may limit your provider's ability to fully assess your condition. If your provider identifies any concerns that need to be evaluated in person, we will make arrangements to do so.  Finally, though the technology is pretty good, we cannot assure that it will always work on either your or our end, and in the setting of a video visit, we may have to convert it to a phone-only visit.  In either situation, we cannot ensure that we have a secure connection.  Are you willing to proceed?" STAFF: Did the patient verbally acknowledge consent to telehealth visit? Document  YES/NO here: Yes  4. Advise patient to be prepared - "Two hours prior to your appointment, go ahead and check your blood pressure, pulse, oxygen saturation, and your weight (if you have the equipment to check those) and write them all down. When your visit starts, your provider will ask you for this information. If you have an Apple Watch or Kardia device, please plan to have heart rate information ready on the day of your appointment. Please have a pen and paper handy nearby the day of the visit as well."  5. Give patient instructions for MyChart download to smartphone OR [email or web address] as below if video visit (depending on what platform provider is using)  6. Inform patient they will receive a phone call 15 minutes prior to their appointment time (may be from unknown caller ID) so they should be prepared to answer    TELEPHONE CALL NOTE  Erin Sexton has been deemed a candidate for a follow-up tele-health visit to limit community exposure during the Covid-19 pandemic. I spoke with the patient via phone to ensure availability of phone/video source, confirm preferred email & phone number, and discuss instructions and expectations.  I reminded Erin Sexton to be prepared with any vital sign and/or heart rhythm information that could potentially be obtained via home monitoring, at the time of her visit. I reminded Erin Sexton to expect a phone call prior to  her visit.  [name] 07/29/2019 3:36 PM

## 2019-07-30 ENCOUNTER — Other Ambulatory Visit: Payer: Self-pay

## 2019-07-30 ENCOUNTER — Encounter (HOSPITAL_COMMUNITY): Payer: Self-pay

## 2019-07-30 ENCOUNTER — Ambulatory Visit (HOSPITAL_COMMUNITY)
Admission: RE | Admit: 2019-07-30 | Discharge: 2019-07-30 | Disposition: A | Discharge status: Home / Self Care | Payer: Medicare Other | Source: Ambulatory Visit | Attending: Internal Medicine | Admitting: Internal Medicine

## 2019-07-30 DIAGNOSIS — Z1231 Encounter for screening mammogram for malignant neoplasm of breast: Secondary | ICD-10-CM | POA: Diagnosis not present

## 2019-08-02 ENCOUNTER — Other Ambulatory Visit: Payer: Self-pay

## 2019-08-02 ENCOUNTER — Ambulatory Visit (INDEPENDENT_AMBULATORY_CARE_PROVIDER_SITE_OTHER): Payer: Medicare Other | Admitting: Licensed Clinical Social Worker

## 2019-08-02 DIAGNOSIS — F331 Major depressive disorder, recurrent, moderate: Secondary | ICD-10-CM | POA: Diagnosis not present

## 2019-08-03 ENCOUNTER — Encounter: Payer: Self-pay | Admitting: Student

## 2019-08-03 ENCOUNTER — Other Ambulatory Visit: Payer: Self-pay

## 2019-08-03 ENCOUNTER — Telehealth (INDEPENDENT_AMBULATORY_CARE_PROVIDER_SITE_OTHER): Payer: Medicare Other | Admitting: Student

## 2019-08-03 ENCOUNTER — Encounter (HOSPITAL_COMMUNITY): Payer: Self-pay | Admitting: Licensed Clinical Social Worker

## 2019-08-03 VITALS — Ht 69.0 in | Wt 348.2 lb

## 2019-08-03 DIAGNOSIS — E785 Hyperlipidemia, unspecified: Secondary | ICD-10-CM

## 2019-08-03 DIAGNOSIS — I421 Obstructive hypertrophic cardiomyopathy: Secondary | ICD-10-CM | POA: Diagnosis not present

## 2019-08-03 DIAGNOSIS — I5032 Chronic diastolic (congestive) heart failure: Secondary | ICD-10-CM

## 2019-08-03 DIAGNOSIS — G4733 Obstructive sleep apnea (adult) (pediatric): Secondary | ICD-10-CM

## 2019-08-03 DIAGNOSIS — I10 Essential (primary) hypertension: Secondary | ICD-10-CM

## 2019-08-03 NOTE — Progress Notes (Signed)
Virtual Visit via Video Note   This visit type was conducted due to national recommendations for restrictions regarding the COVID-19 Pandemic (e.g. social distancing) in an effort to limit this patient's exposure and mitigate transmission in our community.  Due to her co-morbid illnesses, this patient is at least at moderate risk for complications without adequate follow up.  This format is felt to be most appropriate for this patient at this time.  All issues noted in this document were discussed and addressed.  A limited physical exam was performed with this format.  Please refer to the patient's chart for her consent to telehealth for [facility].   Date:  08/03/2019   ID:  Erin Sexton, DOB Dec 13, 1970, MRN [id number]  Patient Location: Home Provider Location: Office  PCP:  Doree Albee, MD  Cardiologist:  Kate Sable, MD  Electrophysiologist:  None   Evaluation Performed:  Follow-Up Visit  Chief Complaint: Dyspnea and Edema  History of Present Illness:    Erin Sexton is a 48 y.o. female with past medical history of hypertrophic cardiomyopathy, chronic diastolic CHF, HTN, HLD, and OSA (noncompliant with CPAP) who presents for a follow-up telehealth visit in regards to worsening dyspnea and lower extremity edema.  She was last examined by Dr. Bronson Ing in 10/2018 and denied any recent chest pain, dyspnea, or lower extremity edema at that time.  Weight was stable at 357 lbs and she was continued on her current medication regimen including ASA, Atorvastatin 80mg  daily, Lopressor 50mg  BID, and Torsemide 20mg  BID.   In talking with the patient today, she reports having worsening lower extremity edema over the past few weeks but feels like this has now started to slightly improve. Still having dyspnea on exertion with minimal activity. She has baseline orthopnea and sleeps in her recliner on a nightly basis. Has been doing this for 5 to 6 years. She reports that  weight has overall been stable on her home scales. Denies any recent chest pain or palpitations. No recent lightheadedness, dizziness, or presyncope. She does try to monitor the sodium intake in her diet.  The patient does not have symptoms concerning for COVID-19 infection (fever, chills, cough, or new shortness of breath).    Past Medical History:  Diagnosis Date  . Abnormal CT scan    a. 02/2014: evaluated for dyspnea and initially placed on Xarelto for PE because radiologist could not exclude small peripheral pulmonary emboli because of limited contrast. However, after admission for ++vaginal bleeding several days later, it was felt she did NOT have PE - underwent further testing with neg VQ 03/06/14, neg LE duplex.  . Anemia   . Anxiety   . Asthma   . Blood transfusion without reported diagnosis   . Bone spur of right foot   . CHF (congestive heart failure) ([place])   . COPD (chronic obstructive pulmonary disease) ([place])   . Depression   . Diabetes mellitus without complication ([place])    a. Dx 02/2014.  [name] History of echocardiogram 2015   normal EF, Grade II diastolic dysfunction  . HOCM (hypertrophic obstructive cardiomyopathy) ([place])    a. Dx [id number].  [name] Hypertension   . Morbid obesity ([place])   . NSVT (nonsustained ventricular tachycardia) ([place])    a. Noted on tele during 02/2014 adm for symptomatic anemia.  [name] SVT (supraventricular tachycardia) ([place])    a. Noted on tele during 02/2014 adm for symptomatic anemia.  . Vaginal bleeding    Past Surgical History:  Procedure Laterality Date  . DILITATION & CURRETTAGE/HYSTROSCOPY WITH NOVASURE ABLATION N/A 08/07/2016   Procedure: DILATATION & CURETTAGE/HYSTEROSCOPY WITH ATTPEMPTED NOVASURE ABLATION AND INSERTION OF IUD;  Surgeon: Florian Buff, MD;  Location: [facility];  Service: Gynecology;  Laterality: N/A;     Current Meds  Medication Sig  . albuterol (PROVENTIL) (2.5 MG/3ML) 0.083% nebulizer solution Take 3 mLs (2.5 mg total) by nebulization  every 4 (four) hours as needed for wheezing or shortness of breath.  [name] albuterol (PROVENTIL) (5 MG/ML) 0.5% nebulizer solution Take 0.5 mLs (2.5 mg total) by nebulization every 6 (six) hours as needed for wheezing or shortness of breath.  . Albuterol Sulfate (PROAIR HFA IN) Inhale into the lungs.  [name] atorvastatin (LIPITOR) 80 MG tablet Take 80 mg by mouth daily.   [name] buPROPion (WELLBUTRIN XL) 300 MG 24 hr tablet Take 1 tablet (300 mg total) by mouth daily.  . Cholecalciferol (VITAMIN D PO) Take 15,000 capsules by mouth.   . estradiol (ESTRACE) 2 MG tablet Take 1 tablet (2 mg total) by mouth daily.  [name] glipiZIDE (GLUCOTROL XL) 5 MG 24 hr tablet   . lisinopril (PRINIVIL,ZESTRIL) 5 MG tablet Take 2.5 mg by mouth daily.  . megestrol (MEGACE) 40 MG tablet 3 tablets a day for 5 days, 2 tablets a day for 5 days then 1 tablet daily  . metFORMIN (GLUCOPHAGE) 500 MG tablet Take 1 tablet (500 mg total) by mouth 2 (two) times daily with a meal.  . methocarbamol (ROBAXIN) 500 MG tablet Take 2 tablets (1,000 mg total) by mouth 4 (four) times daily as needed for muscle spasms (muscle spasm/pain).  . metoprolol (LOPRESSOR) 50 MG tablet Take 50 mg by mouth 2 (two) times daily.   . potassium chloride SA (K-DUR,KLOR-CON) 20 MEQ tablet Take 20 mEq by mouth 2 (two) times daily.   . SYMBICORT 160-4.5 MCG/ACT inhaler   . thyroid (NP THYROID) 120 MG tablet Take 150 mg by mouth daily before breakfast.   . thyroid (NP THYROID) 30 MG tablet Take 30 mg by mouth daily before breakfast.  . torsemide (DEMADEX) 20 MG tablet Take 1 tablet (20 mg total) by mouth daily. (Patient taking differently: Take 20 mg by mouth 2 (two) times daily. )     Allergies:   Patient has no known allergies.   Social History   Tobacco Use  . Smoking status: Never Smoker  . Smokeless tobacco: Never Used  Substance Use Topics  . Alcohol use: No  . Drug use: No     Family Hx: The patient's family history includes Arthritis in her mother;  Bipolar disorder in her sister; Diabetes in her mother; Mental illness in her maternal grandmother, mother, and sister; Other in her mother; Uterine cancer in her maternal grandmother. There is no history of CAD.  ROS:   Please see the history of present illness.     All other systems reviewed and are negative.   Prior CV studies:   The following studies were reviewed today:  Echocardiogram: 06/2016 Study Conclusions  - Left ventricle: Turbulent flow through LVOT Some obstruction to   outflow Peak and mean gradients through the subvalvular apparatus   are 48 and 22 mm Hg respectively. Severe asymmetric LVH. The   cavity size was normal. Systolic function was vigorous. The   estimated ejection fraction was in the range of 65% to 70%.   Doppler parameters are consistent with abnormal left ventricular   relaxation (grade 1 diastolic dysfunction).  Labs/Other Tests  and Data Reviewed:    EKG:  No ECG reviewed.  Recent Labs: 01/31/2019: B Natriuretic Peptide 29.0; BUN 18; Creatinine, Ser 1.31; Hemoglobin 14.4; Platelets 236; Potassium 3.6; Sodium 138   Recent Lipid Panel Lab Results  Component Value Date/Time   CHOL 97 06/21/2016 05:10 AM   TRIG 91 06/21/2016 05:10 AM   HDL 25 (L) 06/21/2016 05:10 AM   CHOLHDL 3.9 06/21/2016 05:10 AM   LDLCALC 54 06/21/2016 05:10 AM    Wt Readings from Last 3 Encounters:  08/03/19 (!) 348 lb 3.2 oz (157.9 kg)  01/31/19 (!) 348 lb (157.9 kg)  12/15/18 (!) 357 lb (161.9 kg)     Objective:    Vital Signs:  Ht 5\' 9"  (1.753 m)   Wt (!) 348 lb 3.2 oz (157.9 kg)   LMP 06/04/2014   BMI 51.42 kg/m    General: Pleasant female appearing in NAD Psych: Normal affect. Neuro: Alert and oriented X 3. Moves all extremities spontaneously. Lungs:  Resp regular and unlabored by video assessment. , CTA. Extremities: No clubbing or cyanosis. 1+ pitting edema up to mid-shins bilaterally.  ASSESSMENT & PLAN:    1. HOCM/Chronic Diastolic CHF - She has  experienced worsening dyspnea and lower extremity edema for the past few weeks.  Recommended that she take Torsemide 40mg  in AM/20mg  in PM for 1 week then resume at her prior dosing of 20mg  BID as we want to avoid volume depletion given known HOCM. Recommended repeat labs next week and she already has this scheduled with her PCP on Monday. Will request a copy once available. Continue Lopressor 50 mg twice daily. If symptoms do not improve with diuretic therapy adjustment, would have a low threshold to repeat an echocardiogram as her last study was in 2017.  2. HTN - She does not have a BP cuff at home but reports this has been well controlled when checked at prior office visits. Remains on Lisinopril 2.5 mg daily and Lopressor 50 mg twice daily.  3. HLD - Followed by PCP. She remains on Atorvastatin 80 mg daily.  She is due for repeat labs next week and will obtain a copy of these records once available.  4. Morbid Obesity - BMI elevated to 51. Weight has declined by 10 lbs since earlier this year. Congratulated on this with continued weight loss encouraged.  5. OSA - previously intolerant to CPAP given claustrophobia. She is open to undergoing a repeat sleep study once COVID has improved. Would recommend this given her morbid obesity and CHF.   COVID-19 Education: The signs and symptoms of COVID-19 were discussed with the patient and how to seek care for testing (follow up with PCP or arrange E-visit).  The importance of social distancing was discussed today.  Time:   Today, I have spent 16 minutes with the patient with telehealth technology discussing the above problems.     Medication Adjustments/Labs and Tests Ordered: Current medicines are reviewed at length with the patient today.  Concerns regarding medicines are outlined above.   Tests Ordered: No orders of the defined types were placed in this encounter.   Medication Changes: No orders of the defined types were placed in this  encounter.   Follow Up: Keep scheduled IN OFFICE follow-up with Dr. Bronson Ing in 2 months.   Signed, Erma Heritage, PA-C  08/03/2019 5:02 PM    [facility]

## 2019-08-03 NOTE — Progress Notes (Signed)
Virtual Visit via Video Note  I connected with Erin Sexton on 08/03/19 at  4:00 PM EDT by a video enabled telemedicine application and verified that I am speaking with the correct person using two identifiers.  Location: Patient: Home Provider: Office   I discussed the limitations of evaluation and management by telemedicine and the availability of in person appointments. The patient expressed understanding and agreed to proceed.  Participation Level: Active  Behavioral Response: CasualAlertDepressed  Type of Therapy: Individual Therapy  Treatment Goals addressed: Coping  Interventions: CBT and Solution Focused  Summary: Erin Sexton is a 48 y.o. female who presents oriented x5 (person, place, situation, time, and object), casually dressed, appropriately groomed, average height, average weight, and cooperative to address mood. Patient has a history of medical treatment including COPD, hypertension, and diabetes. Patient has minimal history of mental health treatment. Patient denies suicidal and homicidal ideations. Patient denies psychosis including auditory and visual hallucinations. Patient denies substance abuse. Patient is at low risk for lethality.  Physically: No issues identified.    Spiritually/values: No issues identified.  Relationships: Patient's friend has stop talking to her. He has gone back with his wife and patient feels like his wife doesn't like her which has caused him to no longer talk to her. She feels like a "place holder" for men until something better comes along. Patient feels very alone. Patient agreed to reach out to her old friends to build her support system.  Emotionally/Mentally/Behavior: Patient's mood is depressed. She feels lonely and isolate. She feels like people, especially men, leave her with no explanation. She feels like her ex and her friend are cowards for not explaining why they want to end the relationship with her.   Patient engaged  in session. Patient responded well to interventions. Patient continues to meet criteria MDD (Major depressive disorder), recurrent episode, moderate. Patient will continue in outpatient therapy due to being the least restrictive service to meet her needs. Patient made minimal progress on her goals.   Suicidal/Homicidal: Negativewithout intent/plan  Therapist Response: Therapist reviewed patient's recent thoughts and behaviors. Therapist utilized CBT to address mood. Therapist processed patient's feelings to identify triggers for mood. Therapist discussed patient's relationships.    Plan: Return again in 4 weeks.  Diagnosis: Axis I: MDD (Major depressive disorder), recurrent episode, moderate    Axis II: No diagnosis  I discussed the assessment and treatment plan with the patient. The patient was provided an opportunity to ask questions and all were answered. The patient agreed with the plan and demonstrated an understanding of the instructions.   The patient was advised to call back or seek an in-person evaluation if the symptoms worsen or if the condition fails to improve as anticipated.  I provided 40 minutes of non-face-to-face time during this encounter.  Glori Bickers, LCSW 08/03/2019

## 2019-08-03 NOTE — Patient Instructions (Signed)
Medication Instructions:  Your physician has recommended you make the following change in your medication:   ncrease Torsemide to 40 mg in the AM and 20 mg in PM for 1 Week then Resume Torsemide 20 mg Two Times Daily   If you need a refill on your cardiac medications before your next appointment, please call your pharmacy.   Lab work: NONE   If you have labs (blood work) drawn today and your tests are completely normal, you will receive your results only by: [name] MyChart Message (if you have MyChart) OR . A paper copy in the mail If you have any lab test that is abnormal or we need to change your treatment, we will call you to review the results.  Testing/Procedures: None  Follow-Up: At [facility], you and your health needs are our priority.  As part of our continuing mission to provide you with exceptional heart care, we have created designated Provider Care Teams.  These Care Teams include your primary Cardiologist (physician) and Advanced Practice Providers (APPs -  Physician Assistants and Nurse Practitioners) who all work together to provide you with the care you need, when you need it. You will need a follow up appointment in 3 months.  Please call our office 2 months in advance to schedule this appointment.  You may see Kate Sable, MD or one of the following Advanced Practice Providers on your designated Care Team:   Bernerd Pho, PA-C [facility]) . Ermalinda Barrios, PA-C ([place])  Any Other Special Instructions Will Be Listed Below (If Applicable).  Call in 1 week and report symptoms to our office.   Thank you for choosing [facility]!

## 2019-08-08 IMAGING — MG DIGITAL SCREENING BILATERAL MAMMOGRAM WITH TOMO AND CAD
5 series · 6 of 17 positions shown · non-contrast
Comparison: Previous exam(s).

ACR Breast Density Category a: The breast tissue is almost entirely
fatty.

CLINICAL DATA: Screening.

EXAM:
DIGITAL SCREENING BILATERAL MAMMOGRAM WITH TOMO AND CAD

[L CC synth-2D]
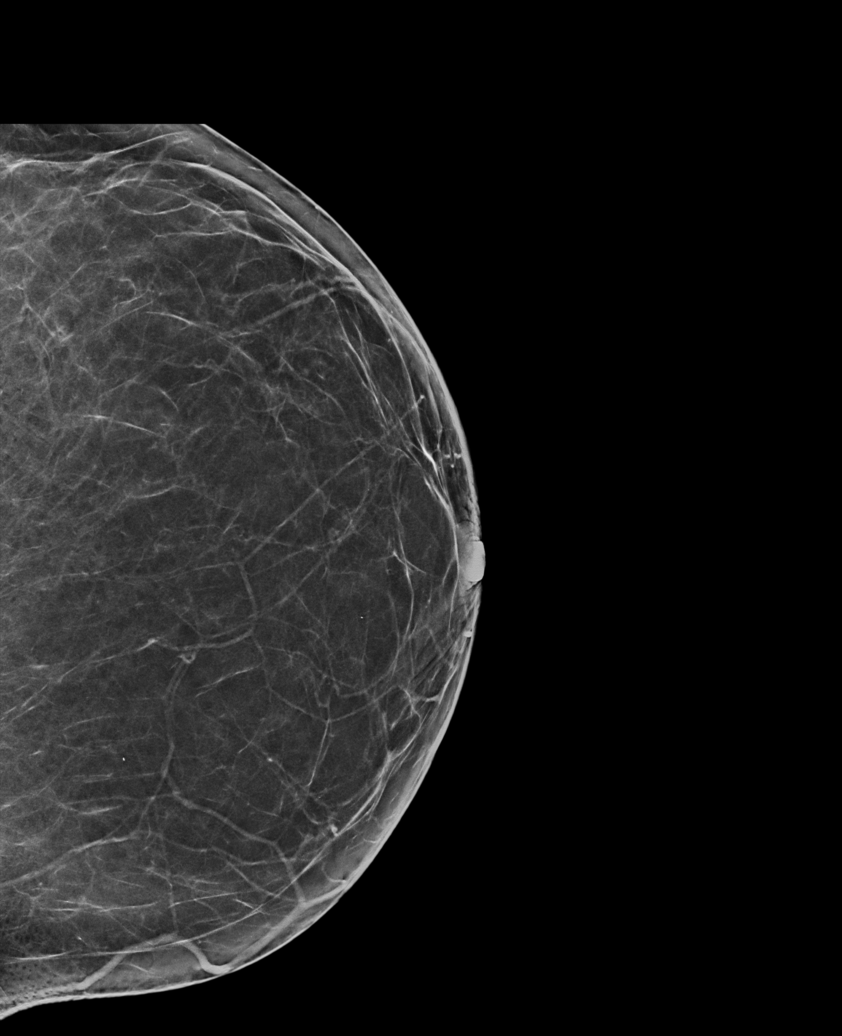

[L MLO synth-2D]
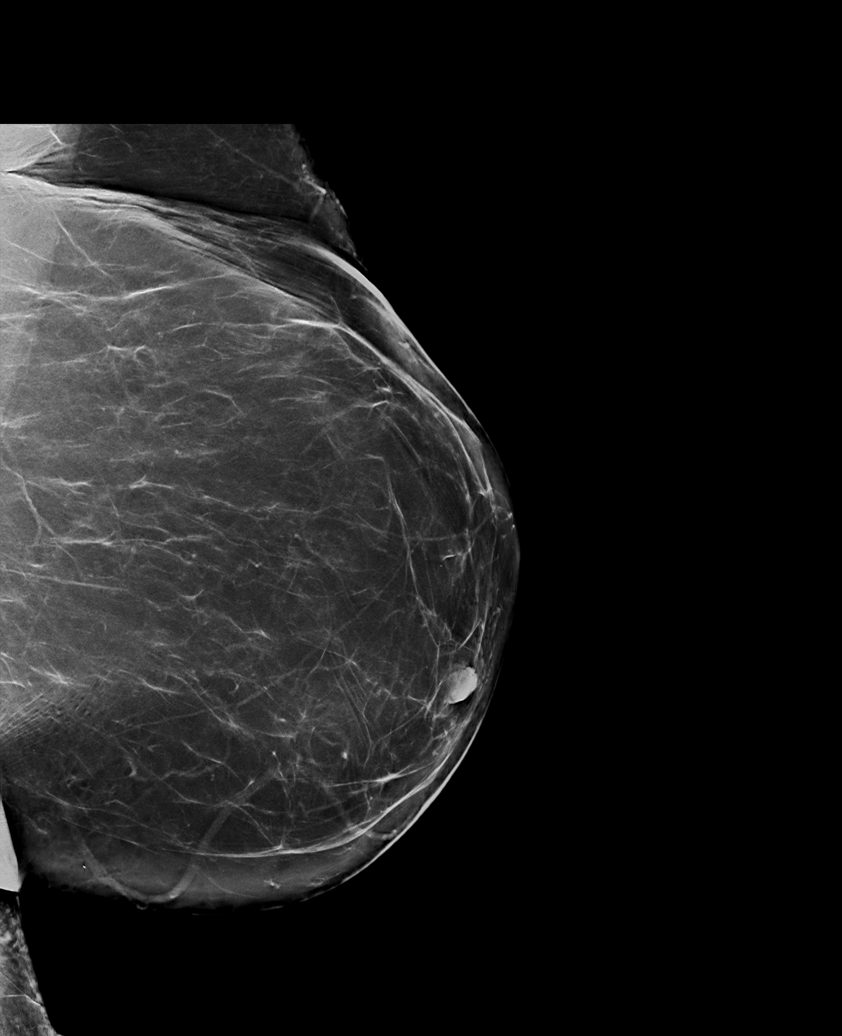

[L MLO tomo · 2 of 99 frames shown]
[frame 32/99]
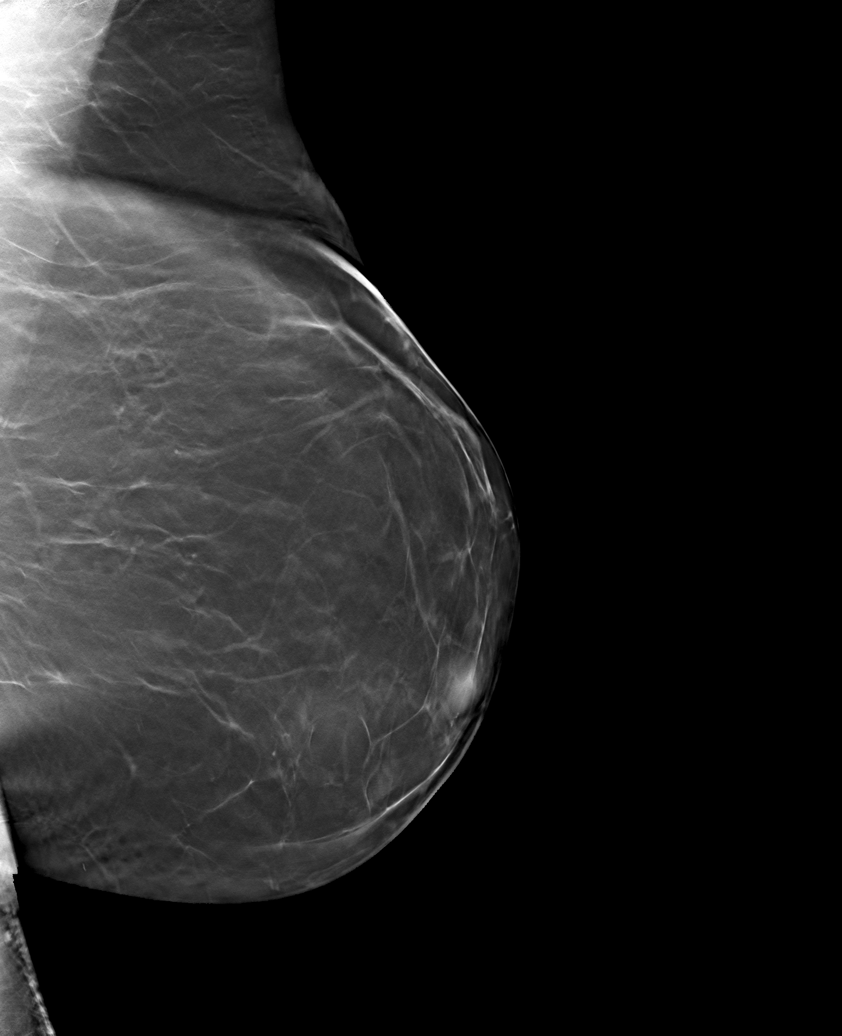
[frame 50/99]
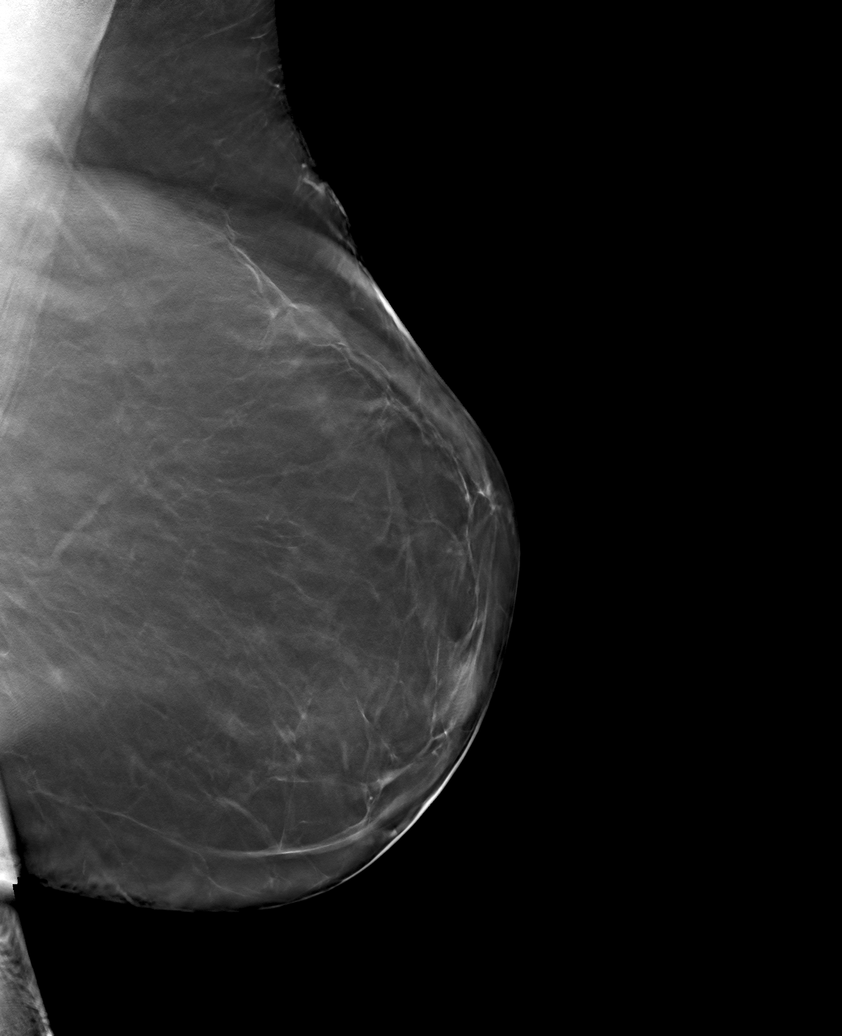

[R CC tomo · tomo slice 35/69.0]
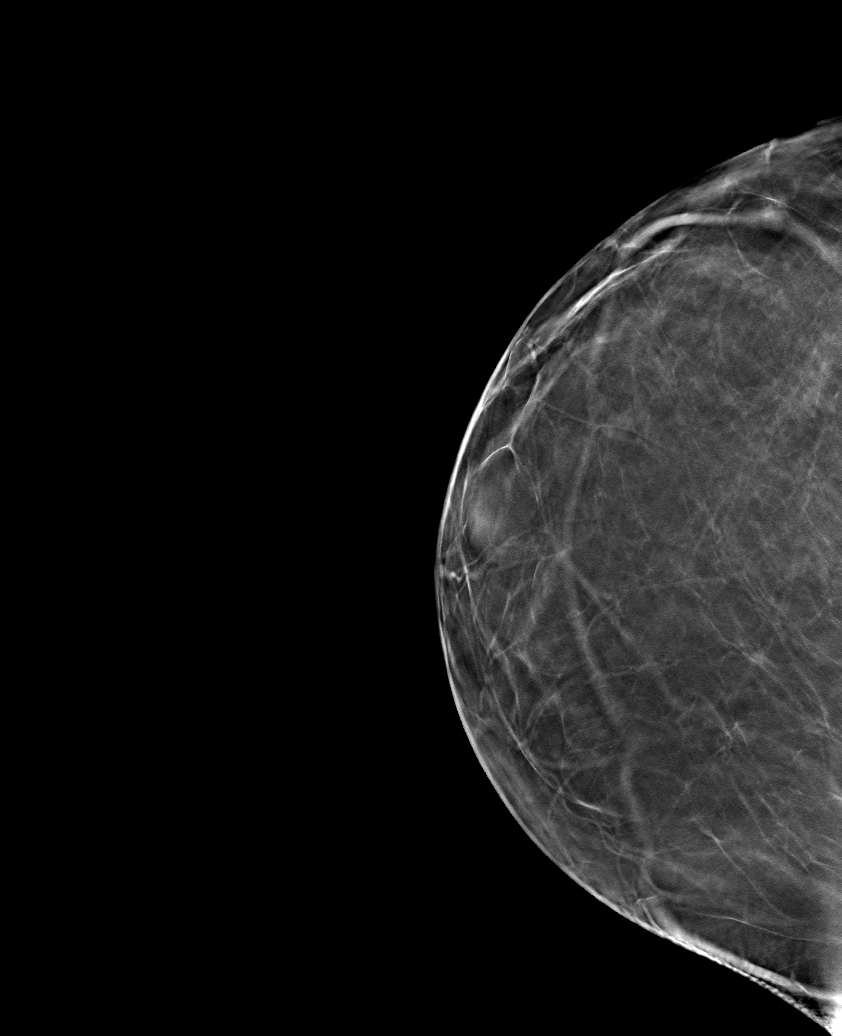

[L CC tomo · tomo slice 37/72.0]
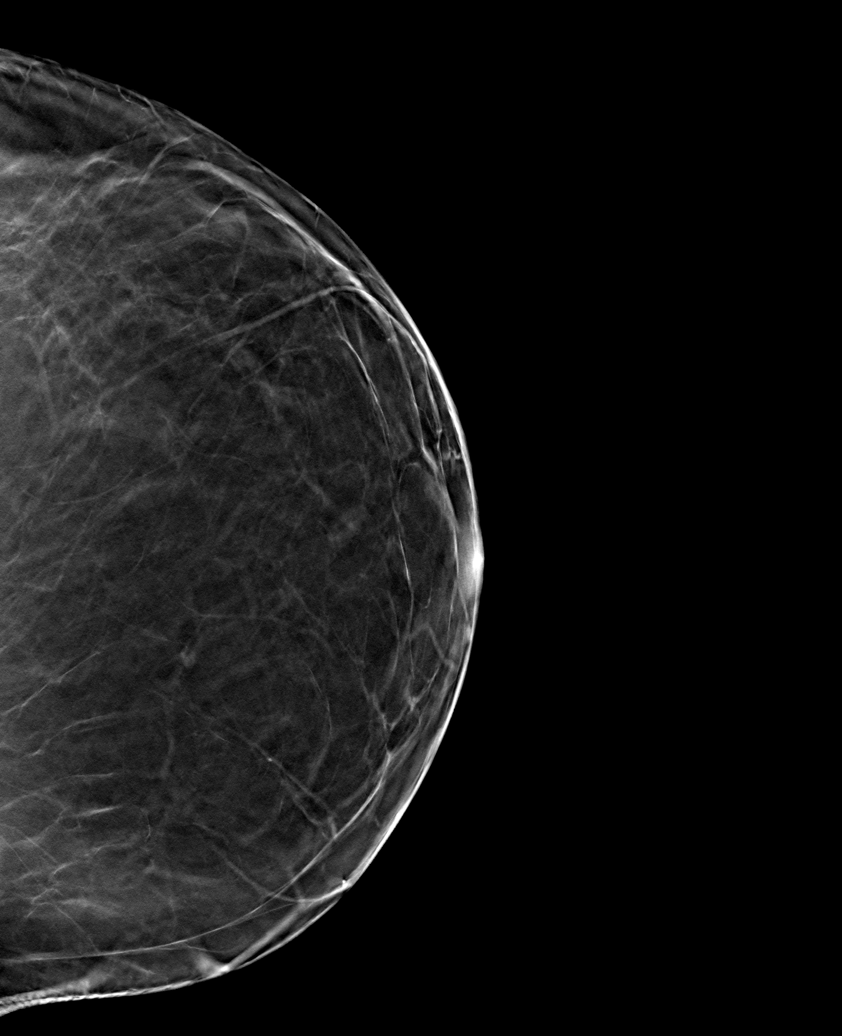

[6 of 17 positions shown; findings below may reference images not displayed]

FINDINGS: There are no findings suspicious for malignancy. Images were
processed with CAD.
IMPRESSION: No mammographic evidence of malignancy. A result letter of this
screening mammogram will be mailed directly to the patient.

RECOMMENDATION:
Screening mammogram in one year. (Code:[id number])

BI-RADS CATEGORY  1: Negative.

## 2019-08-09 DIAGNOSIS — N951 Menopausal and female climacteric states: Secondary | ICD-10-CM | POA: Diagnosis not present

## 2019-08-09 DIAGNOSIS — E039 Hypothyroidism, unspecified: Secondary | ICD-10-CM | POA: Diagnosis not present

## 2019-08-09 DIAGNOSIS — R5383 Other fatigue: Secondary | ICD-10-CM | POA: Diagnosis not present

## 2019-08-09 DIAGNOSIS — I10 Essential (primary) hypertension: Secondary | ICD-10-CM | POA: Diagnosis not present

## 2019-08-09 DIAGNOSIS — E119 Type 2 diabetes mellitus without complications: Secondary | ICD-10-CM | POA: Diagnosis not present

## 2019-08-09 DIAGNOSIS — E2839 Other primary ovarian failure: Secondary | ICD-10-CM | POA: Diagnosis not present

## 2019-08-09 DIAGNOSIS — E559 Vitamin D deficiency, unspecified: Secondary | ICD-10-CM | POA: Diagnosis not present

## 2019-08-10 ENCOUNTER — Other Ambulatory Visit (INDEPENDENT_AMBULATORY_CARE_PROVIDER_SITE_OTHER): Payer: Self-pay | Admitting: Internal Medicine

## 2019-08-10 ENCOUNTER — Encounter (INDEPENDENT_AMBULATORY_CARE_PROVIDER_SITE_OTHER): Payer: Self-pay | Admitting: Internal Medicine

## 2019-08-10 MED ORDER — GLIPIZIDE 5 MG PO TABS: 5.0000 mg | ORAL_TABLET | Freq: Two times a day (BID) | ORAL | 3 refills | Status: DC

## 2019-08-10 NOTE — Telephone Encounter (Signed)
Vitamin D value was 99; normal range.

## 2019-08-11 ENCOUNTER — Encounter (INDEPENDENT_AMBULATORY_CARE_PROVIDER_SITE_OTHER): Payer: Self-pay | Admitting: *Deleted

## 2019-08-11 ENCOUNTER — Telehealth (INDEPENDENT_AMBULATORY_CARE_PROVIDER_SITE_OTHER): Payer: Self-pay | Admitting: *Deleted

## 2019-08-11 DIAGNOSIS — Z1211 Encounter for screening for malignant neoplasm of colon: Secondary | ICD-10-CM

## 2019-08-11 MED ORDER — PEG 3350-KCL-NA BICARB-NACL 420 G PO SOLR: 4000.0000 mL | Freq: Once | ORAL | 0 refills | Status: AC

## 2019-08-11 NOTE — Telephone Encounter (Signed)
Patient needs trilyte TCS sch'd 10/22

## 2019-08-17 ENCOUNTER — Ambulatory Visit (INDEPENDENT_AMBULATORY_CARE_PROVIDER_SITE_OTHER): Payer: Medicare Other | Admitting: Obstetrics & Gynecology

## 2019-08-17 ENCOUNTER — Encounter: Payer: Self-pay | Admitting: Obstetrics & Gynecology

## 2019-08-17 ENCOUNTER — Other Ambulatory Visit: Payer: Self-pay

## 2019-08-17 VITALS — BP 133/79 | HR 73 | Ht 69.0 in | Wt 352.2 lb

## 2019-08-17 DIAGNOSIS — N938 Other specified abnormal uterine and vaginal bleeding: Secondary | ICD-10-CM

## 2019-08-17 DIAGNOSIS — N951 Menopausal and female climacteric states: Secondary | ICD-10-CM

## 2019-08-17 MED ORDER — MEGESTROL ACETATE 40 MG PO TABS: ORAL_TABLET | ORAL | 3 refills | Status: DC

## 2019-08-17 NOTE — Progress Notes (Signed)
Chief Complaint  Patient presents with  . Follow-up    vaginal bleeding- took megace- bleeding stop      48 y.o. No obstetric history on file. Patient's last menstrual period was 06/04/2014. The current method of family planning is post menopausal status.  Outpatient Encounter Medications as of 08/17/2019  Medication Sig  . albuterol (PROVENTIL) (5 MG/ML) 0.5% nebulizer solution Take 0.5 mLs (2.5 mg total) by nebulization every 6 (six) hours as needed for wheezing or shortness of breath.  . Albuterol Sulfate (PROAIR HFA IN) Inhale into the lungs.  [name] atorvastatin (LIPITOR) 80 MG tablet Take 80 mg by mouth daily.   [name] buPROPion (WELLBUTRIN XL) 300 MG 24 hr tablet Take 1 tablet (300 mg total) by mouth daily.  . Cholecalciferol (VITAMIN D PO) Take 15,000 capsules by mouth.   . estradiol (ESTRACE) 2 MG tablet Take 1 tablet (2 mg total) by mouth daily.  [name] glipiZIDE (GLUCOTROL XL) 5 MG 24 hr tablet   . glipiZIDE (GLUCOTROL) 5 MG tablet Take 1 tablet (5 mg total) by mouth 2 (two) times daily before a meal.  . lisinopril (PRINIVIL,ZESTRIL) 5 MG tablet Take 2.5 mg by mouth daily.  . megestrol (MEGACE) 40 MG tablet 3 tablets a day for 5 days, 2 tablets a day for 5 days then 1 tablet daily  . metFORMIN (GLUCOPHAGE) 500 MG tablet Take 1 tablet (500 mg total) by mouth 2 (two) times daily with a meal.  . metoprolol (LOPRESSOR) 50 MG tablet Take 50 mg by mouth 2 (two) times daily.   . potassium chloride SA (K-DUR,KLOR-CON) 20 MEQ tablet Take 20 mEq by mouth 2 (two) times daily.   . SYMBICORT 160-4.5 MCG/ACT inhaler   . thyroid (NP THYROID) 120 MG tablet Take 150 mg by mouth daily before breakfast.   . thyroid (NP THYROID) 30 MG tablet Take 30 mg by mouth daily before breakfast.  . albuterol (PROVENTIL) (2.5 MG/3ML) 0.083% nebulizer solution Take 3 mLs (2.5 mg total) by nebulization every 4 (four) hours as needed for wheezing or shortness of breath. (Patient not taking: Reported on 08/17/2019)  .  methocarbamol (ROBAXIN) 500 MG tablet Take 2 tablets (1,000 mg total) by mouth 4 (four) times daily as needed for muscle spasms (muscle spasm/pain). (Patient not taking: Reported on 08/17/2019)  . torsemide (DEMADEX) 20 MG tablet Take 1 tablet (20 mg total) by mouth daily. (Patient not taking: Reported on 08/17/2019)   No facility-administered encounter medications on file as of 08/17/2019.     Subjective Pt has stopped bleeding on the megestrol, she has an IUD in place and was amenorrheic however she began having menopausal symptoms and I placed her on estradiol + prometrium, the prometrium for endometrial protection and unfortunatley she began bleeding heavily Placed on megace an the bleeding has stopped again Past Medical History:  Diagnosis Date  . Abnormal CT scan    a. 02/2014: evaluated for dyspnea and initially placed on Xarelto for PE because radiologist could not exclude small peripheral pulmonary emboli because of limited contrast. However, after admission for ++vaginal bleeding several days later, it was felt she did NOT have PE - underwent further testing with neg VQ 03/06/14, neg LE duplex.  . Anemia   . Anxiety   . Asthma   . Blood transfusion without reported diagnosis   . Bone spur of right foot   . CHF (congestive heart failure) ([place])   . COPD (chronic obstructive pulmonary disease) ([place])   .  Depression   . Diabetes mellitus without complication ([place])    a. Dx 02/2014.  [name] History of echocardiogram 2015   normal EF, Grade II diastolic dysfunction  . HOCM (hypertrophic obstructive cardiomyopathy) ([place])    a. Dx [id number].  [name] Hypertension   . Morbid obesity ([place])   . NSVT (nonsustained ventricular tachycardia) ([place])    a. Noted on tele during 02/2014 adm for symptomatic anemia.  [name] SVT (supraventricular tachycardia) ([place])    a. Noted on tele during 02/2014 adm for symptomatic anemia.  . Vaginal bleeding     Past Surgical History:  Procedure Laterality Date  . DILITATION &  CURRETTAGE/HYSTROSCOPY WITH NOVASURE ABLATION N/A 08/07/2016   Procedure: DILATATION & CURETTAGE/HYSTEROSCOPY WITH ATTPEMPTED NOVASURE ABLATION AND INSERTION OF IUD;  Surgeon: Florian Buff, MD;  Location: [facility];  Service: Gynecology;  Laterality: N/A;    OB History    Gravida      Para      Term      Preterm      AB      Living  0     SAB      TAB      Ectopic      Multiple      Live Births              No Known Allergies  Social History   Socioeconomic History  . Marital status: Divorced    Spouse name: Not on file  . Number of children: 0  . Years of education: Not on file  . Highest education level: Not on file  Occupational History  . Not on file  Social Needs  . Financial resource strain: Not on file  . Food insecurity    Worry: Not on file    Inability: Not on file  . Transportation needs    Medical: Not on file    Non-medical: Not on file  Tobacco Use  . Smoking status: Never Smoker  . Smokeless tobacco: Never Used  Substance and Sexual Activity  . Alcohol use: No  . Drug use: No  . Sexual activity: Not Currently    Birth control/protection: I.U.D.  Lifestyle  . Physical activity    Days per week: Not on file    Minutes per session: Not on file  . Stress: Not on file  Relationships  . Social Herbalist on phone: Not on file    Gets together: Not on file    Attends religious service: Not on file    Active member of club or organization: Not on file    Attends meetings of clubs or organizations: Not on file    Relationship status: Not on file  Other Topics Concern  . Not on file  Social History Narrative  . Not on file    Family History  Problem Relation Age of Onset  . Diabetes Mother   . Arthritis Mother   . Other Mother        WPW  . Mental illness Mother   . Mental illness Sister   . Bipolar disorder Sister   . Uterine cancer Maternal Grandmother   . Mental illness Maternal Grandmother   . CAD Neg Hx         No family history of EARLY CAD    Medications:       Current Outpatient Medications:  .  albuterol (PROVENTIL) (5 MG/ML) 0.5% nebulizer solution, Take 0.5 mLs (2.5 mg total) by nebulization  every 6 (six) hours as needed for wheezing or shortness of breath., Disp: 20 mL, Rfl: 0 .  Albuterol Sulfate (PROAIR HFA IN), Inhale into the lungs., Disp: , Rfl:  .  atorvastatin (LIPITOR) 80 MG tablet, Take 80 mg by mouth daily. , Disp: , Rfl: 3 .  buPROPion (WELLBUTRIN XL) 300 MG 24 hr tablet, Take 1 tablet (300 mg total) by mouth daily., Disp: 30 tablet, Rfl: 0 .  Cholecalciferol (VITAMIN D PO), Take 15,000 capsules by mouth. , Disp: , Rfl:  .  estradiol (ESTRACE) 2 MG tablet, Take 1 tablet (2 mg total) by mouth daily., Disp: 90 tablet, Rfl: 3 .  glipiZIDE (GLUCOTROL XL) 5 MG 24 hr tablet, , Disp: , Rfl:  .  glipiZIDE (GLUCOTROL) 5 MG tablet, Take 1 tablet (5 mg total) by mouth 2 (two) times daily before a meal., Disp: 60 tablet, Rfl: 3 .  lisinopril (PRINIVIL,ZESTRIL) 5 MG tablet, Take 2.5 mg by mouth daily., Disp: , Rfl:  .  megestrol (MEGACE) 40 MG tablet, 3 tablets a day for 5 days, 2 tablets a day for 5 days then 1 tablet daily, Disp: 45 tablet, Rfl: 3 .  metFORMIN (GLUCOPHAGE) 500 MG tablet, Take 1 tablet (500 mg total) by mouth 2 (two) times daily with a meal., Disp: 60 tablet, Rfl: 0 .  metoprolol (LOPRESSOR) 50 MG tablet, Take 50 mg by mouth 2 (two) times daily. , Disp: , Rfl:  .  potassium chloride SA (K-DUR,KLOR-CON) 20 MEQ tablet, Take 20 mEq by mouth 2 (two) times daily. , Disp: , Rfl:  .  SYMBICORT 160-4.5 MCG/ACT inhaler, , Disp: , Rfl:  .  thyroid (NP THYROID) 120 MG tablet, Take 150 mg by mouth daily before breakfast. , Disp: , Rfl:  .  thyroid (NP THYROID) 30 MG tablet, Take 30 mg by mouth daily before breakfast., Disp: , Rfl:  .  albuterol (PROVENTIL) (2.5 MG/3ML) 0.083% nebulizer solution, Take 3 mLs (2.5 mg total) by nebulization every 4 (four) hours as needed for wheezing or  shortness of breath. (Patient not taking: Reported on 08/17/2019), Disp: 75 mL, Rfl: 0 .  methocarbamol (ROBAXIN) 500 MG tablet, Take 2 tablets (1,000 mg total) by mouth 4 (four) times daily as needed for muscle spasms (muscle spasm/pain). (Patient not taking: Reported on 08/17/2019), Disp: 25 tablet, Rfl: 0 .  torsemide (DEMADEX) 20 MG tablet, Take 1 tablet (20 mg total) by mouth daily. (Patient not taking: Reported on 08/17/2019), Disp: 90 tablet, Rfl: 3  Objective Blood pressure 133/79, pulse 73, height 5\' 9"  (1.753 m), weight (!) 352 lb 3.2 oz (159.8 kg), last menstrual period 06/04/2014.  Gen obese WF NAD  Pertinent ROS No burning with urination, frequency or urgency No nausea, vomiting or diarrhea Nor fever chills or other constitutional symptoms   Labs or studies     Impression Diagnoses this Encounter::   ICD-10-CM   1. Menopausal symptom  N95.1   2. DUB (dysfunctional uterine bleeding)  N93.8     Established relevant diagnosis(es):   Plan/Recommendations: No orders of the defined types were placed in this encounter.   Labs or Scans Ordered: No orders of the defined types were placed in this encounter.   Management:: >continue IUD for management of her CAH >continue megestrol to manage her menopausal DUB  Continue to try to avoid hysterectomy due to patient's co morbidities  Follow up Return in about 3 months (around 11/16/2019) for Follow up, with Dr Elonda Husky.  Face to face time:  15 minutes  Greater than 50% of the visit time was spent in counseling and coordination of care with the patient.  The summary and outline of the counseling and care coordination is summarized in the note above.   All questions were answered.

## 2019-08-25 ENCOUNTER — Other Ambulatory Visit: Payer: Self-pay

## 2019-08-25 ENCOUNTER — Encounter (HOSPITAL_COMMUNITY): Payer: Self-pay | Admitting: Licensed Clinical Social Worker

## 2019-08-25 ENCOUNTER — Ambulatory Visit (INDEPENDENT_AMBULATORY_CARE_PROVIDER_SITE_OTHER): Payer: Medicare Other | Admitting: Licensed Clinical Social Worker

## 2019-08-25 DIAGNOSIS — F331 Major depressive disorder, recurrent, moderate: Secondary | ICD-10-CM

## 2019-08-25 NOTE — Progress Notes (Signed)
Virtual Visit via Video Note  I connected with Erin Sexton on 08/25/19 at 11:00 AM EDT by a video enabled telemedicine application and verified that I am speaking with the correct person using two identifiers.  Location: Patient: Home Provider: Office   I discussed the limitations of evaluation and management by telemedicine and the availability of in person appointments. The patient expressed understanding and agreed to proceed.  Participation Level: Active  Behavioral Response: CasualAlertDepressed  Type of Therapy: Individual Therapy  Treatment Goals addressed: Coping  Interventions: CBT and Solution Focused  Summary: Erin Sexton is a 48 y.o. female who presents oriented x5 (person, place, situation, time, and object), casually dressed, appropriately groomed, average height, average weight, and cooperative to address mood. Patient has a history of medical treatment including COPD, hypertension, and diabetes. Patient has minimal history of mental health treatment. Patient denies suicidal and homicidal ideations. Patient denies psychosis including auditory and visual hallucinations. Patient denies substance abuse. Patient is at low risk for lethality.  Physically: Patient is having trouble sleeping. She has a lot going on in her mind prior to sleep but has no problems when she actually falls asleep. Patient understood that sleep needs to be a priority. Patient agreed to try writing in a journal to get her worries out prior to getting in bed. Patient also understood to shut down her electronics prior to bedtime.   Spiritually/values: No issues identified.  Relationships: Patient reconnected with her sister after the last session. Patient feels like she doesn't want to address the concern they had previously because she doesn't want to lose her relationship with her sister. She is willing to let it go. Patient said they speak about twice a week and her sister makes effort to  contact her. Patient also found out that her ex who faked his own death to get out of their relationship is now being charged as a sex offender with 26 felony counts. She felt relief and a little happiness that he is getting some kind of punishment since he didn't get any for how he treated her.  Emotionally/Mentally/Behavior: Patient's mood is stable. She denies depression. She denies anxiety. Patient agrees to focus on sleep, and write in her journal to get her worries out. Patient also agreed to identify things to be grateful for to help shift her thinking and her mood.   Patient engaged in session. Patient responded well to interventions. Patient continues to meet criteria MDD (Major depressive disorder), recurrent episode, moderate. Patient will continue in outpatient therapy due to being the least restrictive service to meet her needs. Patient made minimal progress on her goals.   Suicidal/Homicidal: Negativewithout intent/plan  Therapist Response: Therapist reviewed patient's recent thoughts and behaviors. Therapist utilized CBT to address mood. Therapist processed patient's feelings to identify triggers for mood. Therapist provided psychoeducation on sleep. Therapist discussed patient's relationship with her sister. Therapist updated patient's treatment plan.   Plan: Return again in 4 weeks.  Diagnosis: Axis I: MDD (Major depressive disorder), recurrent episode, moderate    Axis II: No diagnosis  I discussed the assessment and treatment plan with the patient. The patient was provided an opportunity to ask questions and all were answered. The patient agreed with the plan and demonstrated an understanding of the instructions.   The patient was advised to call back or seek an in-person evaluation if the symptoms worsen or if the condition fails to improve as anticipated.  I provided 45 minutes of non-face-to-face time during this encounter.  Glori Bickers, LCSW 08/25/2019

## 2019-08-27 ENCOUNTER — Encounter: Payer: Self-pay | Admitting: *Deleted

## 2019-08-30 NOTE — Progress Notes (Signed)
Please send labs from [facility]!

## 2019-09-02 ENCOUNTER — Other Ambulatory Visit: Payer: Self-pay

## 2019-09-02 ENCOUNTER — Ambulatory Visit (INDEPENDENT_AMBULATORY_CARE_PROVIDER_SITE_OTHER): Payer: Self-pay

## 2019-09-02 ENCOUNTER — Telehealth (INDEPENDENT_AMBULATORY_CARE_PROVIDER_SITE_OTHER): Payer: Self-pay | Admitting: *Deleted

## 2019-09-02 NOTE — Telephone Encounter (Signed)
Referring MD/PCP: gosrani   Procedure: tcs  Reason/Indication:  screening  Has patient had this procedure before?  no  If so, when, by whom and where?    Is there a family history of colon cancer?  no  Who?  What age when diagnosed?    Is patient diabetic?   yes      Does patient have prosthetic heart valve or mechanical valve?  no  Do you have a pacemaker/defibrillator?  no  Has patient ever had endocarditis/atrial fibrillation? no  Does patient use oxygen? no  Has patient had joint replacement within last 12 months?  no  Is patient constipated or do they take laxatives? no  Does patient have a history of alcohol/drug use?  no  Is patient on blood thinner such as Coumadin, Plavix and/or Aspirin? no  Medications: NP thyroid 20 mg daily, NP thyroid 120 mg daily, lisinopril 2.5 mg daily, potassium 20 meq bid, bupropion 300 mg daily, atorvastatin 80 mg daily, metoprolol 50 mg bid, Symbicort bid, torsemide 20 mg bid, albuterol prn, metformin 500 mg bid, vit d  Allergies: nkda  Medication Adjustment per Dr Charlena Cross, NP: hold metformin evening before and morning of  Procedure date & time: 09/30/19 at 730

## 2019-09-15 DIAGNOSIS — Z23 Encounter for immunization: Secondary | ICD-10-CM | POA: Diagnosis not present

## 2019-09-24 ENCOUNTER — Encounter (INDEPENDENT_AMBULATORY_CARE_PROVIDER_SITE_OTHER): Payer: Self-pay | Admitting: Internal Medicine

## 2019-09-24 ENCOUNTER — Other Ambulatory Visit (INDEPENDENT_AMBULATORY_CARE_PROVIDER_SITE_OTHER): Payer: Self-pay | Admitting: Internal Medicine

## 2019-09-24 MED ORDER — PHENAZOPYRIDINE HCL 100 MG PO TABS: 100.0000 mg | ORAL_TABLET | Freq: Three times a day (TID) | ORAL | 0 refills | Status: DC | PRN

## 2019-09-27 ENCOUNTER — Encounter (INDEPENDENT_AMBULATORY_CARE_PROVIDER_SITE_OTHER): Payer: Self-pay | Admitting: Internal Medicine

## 2019-09-27 ENCOUNTER — Other Ambulatory Visit: Payer: Self-pay

## 2019-09-27 ENCOUNTER — Ambulatory Visit (INDEPENDENT_AMBULATORY_CARE_PROVIDER_SITE_OTHER): Payer: Medicare Other | Admitting: Internal Medicine

## 2019-09-27 VITALS — BP 138/80 | HR 73 | Temp 98.1°F | Resp 20 | Ht 69.0 in | Wt 349.0 lb

## 2019-09-27 DIAGNOSIS — N3000 Acute cystitis without hematuria: Secondary | ICD-10-CM | POA: Diagnosis not present

## 2019-09-27 MED ORDER — CIPROFLOXACIN HCL 500 MG PO TABS: 500.0000 mg | ORAL_TABLET | Freq: Two times a day (BID) | ORAL | 0 refills | Status: DC

## 2019-09-27 NOTE — Progress Notes (Signed)
Wellness Office Visit  Subjective:  Patient ID: Erin Sexton, female    DOB: [birth year]  Age: 48 y.o. MRN: [id number]  CC: This lady comes in for an acute visit with dysuria and increased frequency of micturition. HPI  Her symptoms started approximately 1 week ago and it became very painful for her to pass urine over the weekend so she has been taking Pyridium over-the-counter.  She has been drinking plenty of water.  She denies any nausea, vomiting or abdominal pain.  She denies any fever.  She denies any\[place] pain. Past Medical History:  Diagnosis Date  . Abnormal CT scan    a. 02/2014: evaluated for dyspnea and initially placed on Xarelto for PE because radiologist could not exclude small peripheral pulmonary emboli because of limited contrast. However, after admission for ++vaginal bleeding several days later, it was felt she did NOT have PE - underwent further testing with neg VQ 03/06/14, neg LE duplex.  . Anemia   . Anxiety   . Asthma   . Blood transfusion without reported diagnosis   . Bone spur of right foot   . CHF (congestive heart failure) ([place])   . COPD (chronic obstructive pulmonary disease) ([place])   . Depression   . Diabetes mellitus without complication ([place])    a. Dx 02/2014.  [name] History of echocardiogram 2015   normal EF, Grade II diastolic dysfunction  . HOCM (hypertrophic obstructive cardiomyopathy) ([place])    a. Dx [id number].  [name] Hypertension   . Morbid obesity ([place])   . NSVT (nonsustained ventricular tachycardia) ([place])    a. Noted on tele during 02/2014 adm for symptomatic anemia.  [name] SVT (supraventricular tachycardia) ([place])    a. Noted on tele during 02/2014 adm for symptomatic anemia.  . Vaginal bleeding       Family History  Problem Relation Age of Onset  . Diabetes Mother   . Arthritis Mother   . Other Mother        WPW  . Mental illness Mother   . Mental illness Sister   . Bipolar disorder Sister   . Uterine cancer Maternal Grandmother   . Mental  illness Maternal Grandmother   . CAD Neg Hx        No family history of EARLY CAD    Social History   Social History Narrative   Divorced for 3 years,married for 5 years.On disability since 2009 secondary to mental illness.     Current Meds  Medication Sig  . albuterol (PROAIR HFA) 108 (90 Base) MCG/ACT inhaler Inhale 2 puffs into the lungs as needed (wheezing).   [name] albuterol (PROVENTIL) (5 MG/ML) 0.5% nebulizer solution Take 0.5 mLs (2.5 mg total) by nebulization every 6 (six) hours as needed for wheezing or shortness of breath.  [name] atorvastatin (LIPITOR) 80 MG tablet Take 80 mg by mouth daily.   [name] buPROPion (WELLBUTRIN XL) 300 MG 24 hr tablet Take 1 tablet (300 mg total) by mouth daily.  . Cholecalciferol (VITAMIN D PO) Take 15,000 Units by mouth daily.   [name] estradiol (ESTRACE) 2 MG tablet Take 1 tablet (2 mg total) by mouth daily. (Patient taking differently: Take 2 mg by mouth at bedtime. )  . glipiZIDE (GLUCOTROL) 5 MG tablet Take 1 tablet (5 mg total) by mouth 2 (two) times daily before a meal.  . lisinopril (ZESTRIL) 2.5 MG tablet Take 2.5 mg by mouth every evening.   . megestrol (MEGACE) 40 MG tablet 3 tablets a day for 5  days, 2 tablets a day for 5 days then 1 tablet daily  . megestrol (MEGACE) 40 MG tablet 1 tablet daily (Patient taking differently: Take 40 mg by mouth at bedtime. )  . metFORMIN (GLUCOPHAGE) 500 MG tablet Take 1 tablet (500 mg total) by mouth 2 (two) times daily with a meal.  . methocarbamol (ROBAXIN) 500 MG tablet Take 2 tablets (1,000 mg total) by mouth 4 (four) times daily as needed for muscle spasms (muscle spasm/pain).  . metoprolol (LOPRESSOR) 50 MG tablet Take 50 mg by mouth 2 (two) times daily.   . phenazopyridine (PYRIDIUM) 100 MG tablet Take 1 tablet (100 mg total) by mouth 3 (three) times daily as needed for pain.  . potassium chloride SA (K-DUR,KLOR-CON) 20 MEQ tablet Take 20 mEq by mouth 2 (two) times daily.   . SYMBICORT 160-4.5 MCG/ACT inhaler  Inhale 2 puffs into the lungs daily.   [name] thyroid (NP THYROID) 120 MG tablet Take 120 mg by mouth daily before breakfast. Take with  30 mg for a total of 150 mg  . thyroid (NP THYROID) 30 MG tablet Take 30 mg by mouth daily before breakfast. Take with 120 mg for a total of 150 mg  . torsemide (DEMADEX) 20 MG tablet Take 1 tablet (20 mg total) by mouth daily. (Patient taking differently: Take 20 mg by mouth 2 (two) times daily. )       Objective:   Today's Vitals: BP 138/80 (BP Location: Left Arm, Patient Position: Sitting, Cuff Size: Large)   Pulse 73   Temp 98.1 F (36.7 C) (Temporal)   Resp 20   Ht 5\' 9"  (1.753 m)   Wt (!) 349 lb (158.3 kg)   LMP 06/04/2014   SpO2 96% Comment: room air with mask.  BMI 51.54 kg/m  Vitals with BMI 09/27/2019 08/17/2019 08/03/2019  Height 5\' 9"  5\' 9"  5\' 9"   Weight 349 lbs 352 lbs 3 oz 348 lbs 3 oz  BMI 51.51 [id number]  Systolic 0000000 [id number] -  Diastolic 80 79 -  Pulse 73 73 -  Some encounter information is confidential and restricted. Go to Review Flowsheets activity to see all data.     Physical Exam    She look systemically well.  No new physical findings.  He does not look toxic or septic clinically.   Assessment   1. Acute cystitis without hematuria       Tests ordered Orders Placed This Encounter  Procedures  . Culture, Urine     Plan: 1. Urine will be sent for culture and I have empirically treated her with ciprofloxacin for 3 days.  We will await culture results and sensitivities.   Meds ordered this encounter  Medications  . ciprofloxacin (CIPRO) 500 MG tablet    Sig: Take 1 tablet (500 mg total) by mouth 2 (two) times daily.    Dispense:  6 tablet    Refill:  0    Alf Doyle Luther Parody, MD

## 2019-09-28 ENCOUNTER — Other Ambulatory Visit (HOSPITAL_COMMUNITY)
Admission: RE | Admit: 2019-09-28 | Discharge: 2019-09-28 | Disposition: A | Discharge status: Home / Self Care | Payer: Medicare Other | Source: Ambulatory Visit | Attending: Internal Medicine | Admitting: Internal Medicine

## 2019-09-28 DIAGNOSIS — Z20828 Contact with and (suspected) exposure to other viral communicable diseases: Secondary | ICD-10-CM | POA: Insufficient documentation

## 2019-09-28 DIAGNOSIS — Z01812 Encounter for preprocedural laboratory examination: Secondary | ICD-10-CM | POA: Insufficient documentation

## 2019-09-28 DIAGNOSIS — K635 Polyp of colon: Secondary | ICD-10-CM | POA: Insufficient documentation

## 2019-09-28 LAB — SARS CORONAVIRUS 2 (TAT 6-24 HRS): SARS Coronavirus 2: NEGATIVE

## 2019-09-29 LAB — URINE CULTURE

## 2019-09-30 ENCOUNTER — Ambulatory Visit (HOSPITAL_COMMUNITY)
Admission: RE | Admit: 2019-09-30 | Discharge: 2019-09-30 | Disposition: A | Discharge status: Home / Self Care | Payer: Medicare Other | Attending: Internal Medicine | Admitting: Internal Medicine

## 2019-09-30 ENCOUNTER — Other Ambulatory Visit: Payer: Self-pay

## 2019-09-30 ENCOUNTER — Encounter (HOSPITAL_COMMUNITY): Payer: Self-pay | Admitting: *Deleted

## 2019-09-30 ENCOUNTER — Encounter (HOSPITAL_COMMUNITY)
Admission: RE | Disposition: A | Discharge status: Home / Self Care | Payer: Self-pay | Source: Home / Self Care | Attending: Internal Medicine

## 2019-09-30 DIAGNOSIS — Z792 Long term (current) use of antibiotics: Secondary | ICD-10-CM | POA: Insufficient documentation

## 2019-09-30 DIAGNOSIS — I110 Hypertensive heart disease with heart failure: Secondary | ICD-10-CM | POA: Insufficient documentation

## 2019-09-30 DIAGNOSIS — Z79899 Other long term (current) drug therapy: Secondary | ICD-10-CM | POA: Diagnosis not present

## 2019-09-30 DIAGNOSIS — I509 Heart failure, unspecified: Secondary | ICD-10-CM | POA: Diagnosis not present

## 2019-09-30 DIAGNOSIS — J449 Chronic obstructive pulmonary disease, unspecified: Secondary | ICD-10-CM | POA: Diagnosis not present

## 2019-09-30 DIAGNOSIS — I421 Obstructive hypertrophic cardiomyopathy: Secondary | ICD-10-CM | POA: Diagnosis not present

## 2019-09-30 DIAGNOSIS — Z1211 Encounter for screening for malignant neoplasm of colon: Secondary | ICD-10-CM | POA: Insufficient documentation

## 2019-09-30 DIAGNOSIS — Z01810 Encounter for preprocedural cardiovascular examination: Secondary | ICD-10-CM | POA: Insufficient documentation

## 2019-09-30 DIAGNOSIS — E119 Type 2 diabetes mellitus without complications: Secondary | ICD-10-CM | POA: Diagnosis not present

## 2019-09-30 DIAGNOSIS — Z79890 Hormone replacement therapy (postmenopausal): Secondary | ICD-10-CM | POA: Diagnosis not present

## 2019-09-30 DIAGNOSIS — Z7984 Long term (current) use of oral hypoglycemic drugs: Secondary | ICD-10-CM | POA: Insufficient documentation

## 2019-09-30 DIAGNOSIS — K5140 Inflammatory polyps of colon without complications: Secondary | ICD-10-CM | POA: Insufficient documentation

## 2019-09-30 DIAGNOSIS — K635 Polyp of colon: Secondary | ICD-10-CM | POA: Diagnosis not present

## 2019-09-30 DIAGNOSIS — D125 Benign neoplasm of sigmoid colon: Secondary | ICD-10-CM | POA: Diagnosis not present

## 2019-09-30 DIAGNOSIS — Z7951 Long term (current) use of inhaled steroids: Secondary | ICD-10-CM | POA: Diagnosis not present

## 2019-09-30 HISTORY — PX: COLONOSCOPY: SHX5424

## 2019-09-30 LAB — GLUCOSE, CAPILLARY: Glucose-Capillary: 181 mg/dL — ABNORMAL HIGH (ref 70–99)

## 2019-09-30 SURGERY — COLONOSCOPY
Anesthesia: Moderate Sedation

## 2019-09-30 MED ORDER — SODIUM CHLORIDE 0.9 % IV SOLN
INTRAVENOUS | Status: DC
  Administered 2019-09-30: 07:00:00 via INTRAVENOUS

## 2019-09-30 MED ORDER — MIDAZOLAM HCL 5 MG/5ML IJ SOLN
INTRAMUSCULAR | Status: DC | PRN
  Administered 2019-09-30 (×5): 2 mg via INTRAVENOUS

## 2019-09-30 MED ORDER — MEPERIDINE HCL 50 MG/ML IJ SOLN
INTRAMUSCULAR | Status: DC | PRN
  Administered 2019-09-30 (×2): 25 mg

## 2019-09-30 MED ORDER — MEPERIDINE HCL 50 MG/ML IJ SOLN
INTRAMUSCULAR | Status: AC
  Filled 2019-09-30: qty 1

## 2019-09-30 MED ORDER — MIDAZOLAM HCL 5 MG/5ML IJ SOLN
INTRAMUSCULAR | Status: AC
  Filled 2019-09-30: qty 10

## 2019-09-30 NOTE — Discharge Instructions (Signed)
No aspirin or NSAIDs for 1 week. Resume usual medications and diet as before. No driving for 24 hours. Physician will call with biopsy results.   Colonoscopy, Adult, Care After This sheet gives you information about how to care for yourself after your procedure. Your doctor may also give you more specific instructions. If you have problems or questions, call your doctor. What can I expect after the procedure? After the procedure, it is common to have:  A small amount of blood in your poop for 24 hours.  Some gas.  Mild cramping or bloating in your belly. Follow these instructions at home: General instructions  For the first 24 hours after the procedure: ? Do not drive or use machinery. ? Do not sign important documents. ? Do not drink alcohol. ? Do your daily activities more slowly than normal. ? Eat foods that are soft and easy to digest.  Take over-the-counter or prescription medicines only as told by your doctor. To help cramping and bloating:   Try walking around.  Put heat on your belly (abdomen) as told by your doctor. Use a heat source that your doctor recommends, such as a moist heat pack or a heating pad. ? Put a towel between your skin and the heat source. ? Leave the heat on for 20-30 minutes. ? Remove the heat if your skin turns bright red. This is especially important if you cannot feel pain, heat, or cold. You can get burned. Eating and drinking   Drink enough fluid to keep your pee (urine) clear or pale yellow.  Return to your normal diet as told by your doctor. Avoid heavy or fried foods that are hard to digest.  Avoid drinking alcohol for as long as told by your doctor. Contact a doctor if:  You have blood in your poop (stool) 2-3 days after the procedure. Get help right away if:  You have more than a small amount of blood in your poop.  You see large clumps of tissue (blood clots) in your poop.  Your belly is swollen.  You feel sick to your  stomach (nauseous).  You throw up (vomit).  You have a fever.  You have belly pain that gets worse, and medicine does not help your pain. Summary  After the procedure, it is common to have a small amount of blood in your poop. You may also have mild cramping and bloating in your belly.  For the first 24 hours after the procedure, do not drive or use machinery, do not sign important documents, and do not drink alcohol.  Get help right away if you have a lot of blood in your poop, feel sick to your stomach, have a fever, or have more belly pain. This information is not intended to replace advice given to you by your health care provider. Make sure you discuss any questions you have with your health care provider. Document Released: 12/28/2010 Document Revised: 09/25/2017 Document Reviewed: 08/19/2016 Elsevier Patient Education  2020 [place].  Colon Polyps  Polyps are tissue growths inside the body. Polyps can grow in many places, including the large intestine (colon). A polyp may be a round bump or a mushroom-shaped growth. You could have one polyp or several. Most colon polyps are noncancerous (benign). However, some colon polyps can become cancerous over time. Finding and removing the polyps early can help prevent this. What are the causes? The exact cause of colon polyps is not known. What increases the risk? You are more likely  to develop this condition if you:  Have a family history of colon cancer or colon polyps.  Are older than 70 or older than 45 if you are African American.  Have inflammatory bowel disease, such as ulcerative colitis or Crohn's disease.  Have certain hereditary conditions, such as: ? Familial adenomatous polyposis. ? Lynch syndrome. ? Turcot syndrome. ? Peutz-Jeghers syndrome.  Are overweight.  Smoke cigarettes.  Do not get enough exercise.  Drink too much alcohol.  Eat a diet that is high in fat and red meat and low in fiber.  Had  childhood cancer that was treated with abdominal radiation. What are the signs or symptoms? Most polyps do not cause symptoms. If you have symptoms, they may include:  Blood coming from your rectum when having a bowel movement.  Blood in your stool. The stool may look dark red or black.  Abdominal pain.  A change in bowel habits, such as constipation or diarrhea. How is this diagnosed? This condition is diagnosed with a colonoscopy. This is a procedure in which a lighted, flexible scope is inserted into the anus and then passed into the colon to examine the area. Polyps are sometimes found when a colonoscopy is done as part of routine cancer screening tests. How is this treated? Treatment for this condition involves removing any polyps that are found. Most polyps can be removed during a colonoscopy. Those polyps will then be tested for cancer. Additional treatment may be needed depending on the results of testing. Follow these instructions at home: Lifestyle  Maintain a healthy weight, or lose weight if recommended by your health care provider.  Exercise every day or as told by your health care provider.  Do not use any products that contain nicotine or tobacco, such as cigarettes and e-cigarettes. If you need help quitting, ask your health care provider.  If you drink alcohol, limit how much you have: ? 0-1 drink a day for women. ? 0-2 drinks a day for men.  Be aware of how much alcohol is in your drink. In the [place] one drink equals one 12 oz bottle of beer (355 mL), one 5 oz glass of wine (148 mL), or one 1 oz shot of hard liquor (44 mL). Eating and drinking   Eat foods that are high in fiber, such as fruits, vegetables, and whole grains.  Eat foods that are high in calcium and vitamin D, such as milk, cheese, yogurt, eggs, liver, fish, and broccoli.  Limit foods that are high in fat, such as fried foods and desserts.  Limit the amount of red meat and processed meat you  eat, such as hot dogs, sausage, bacon, and lunch meats. General instructions  Keep all follow-up visits as told by your health care provider. This is important. ? This includes having regularly scheduled colonoscopies. ? Talk to your health care provider about when you need a colonoscopy. Contact a health care provider if:  You have new or worsening bleeding during a bowel movement.  You have new or increased blood in your stool.  You have a change in bowel habits.  You lose weight for no known reason. Summary  Polyps are tissue growths inside the body. Polyps can grow in many places, including the colon.  Most colon polyps are noncancerous (benign), but some can become cancerous over time.  This condition is diagnosed with a colonoscopy.  Treatment for this condition involves removing any polyps that are found. Most polyps can be removed during a colonoscopy.  This information is not intended to replace advice given to you by your health care provider. Make sure you discuss any questions you have with your health care provider. Document Released: 08/21/2004 Document Revised: 03/12/2018 Document Reviewed: 03/12/2018 Elsevier Patient Education  2020 [employer].

## 2019-09-30 NOTE — Op Note (Signed)
[facility] Patient Name: Erin Sexton Procedure Date: 09/30/2019 11:38 AM MRN: [id number] Date of Birth: 1971/04/18 Attending MD: Hildred Laser , MD CSN: [id number] Age: 48 Admit Type: Outpatient Procedure:                Colonoscopy Indications:              Screening for colorectal malignant neoplasm Providers:                Hildred Laser, MD, Janeece Riggers, RN, Aram Candela Referring MD:             Doree Albee, MD Medicines:                Meperidine 50 mg IV, Midazolam 10 mg IV Complications:            No immediate complications. Estimated Blood Loss:     Estimated blood loss was minimal. Procedure:                Pre-Anesthesia Assessment:                           - Prior to the procedure, a History and Physical                            was performed, and patient medications and                            allergies were reviewed. The patient's tolerance of                            previous anesthesia was also reviewed. The risks                            and benefits of the procedure and the sedation                            options and risks were discussed with the patient.                            All questions were answered, and informed consent                            was obtained. Prior Anticoagulants: The patient has                            taken no previous anticoagulant or antiplatelet                            agents. ASA Grade Assessment: III - A patient with                            severe systemic disease. After reviewing the risks                            and benefits, the patient was deemed in  satisfactory condition to undergo the procedure.                           After obtaining informed consent, the colonoscope                            was passed under direct vision. Throughout the                            procedure, the patient's blood pressure, pulse, and                            oxygen  saturations were monitored continuously. The                            [id number] scope was introduced through                            the anus and advanced to the the cecum, identified                            by appendiceal orifice and ileocecal valve. The                            colonoscopy was somewhat difficult due to a                            redundant colon. The patient tolerated the                            procedure well. The quality of the bowel                            preparation was marginal. The ileocecal valve,                            appendiceal orifice, and rectum were photographed. Scope In: 12:07:00 PM Scope Out: 12:41:29 PM Scope Withdrawal Time: 0 hours 20 minutes 41 seconds  Total Procedure Duration: 0 hours 34 minutes 29 seconds  Findings:      The perianal and digital rectal examinations were normal.      A 5 mm polyp was found in the distal sigmoid colon. The polyp was       semi-pedunculated. The polyp was removed with a hot snare. Resection and       retrieval were complete. The pathology specimen was placed into Bottle       Number 1.      A small polyp was found in the distal sigmoid colon. The polyp was       sessile. The polyp was removed with a cold snare. Resection and       retrieval were complete. The pathology specimen was placed into Bottle       Number 1.      The exam was otherwise normal throughout the examined colon.      The retroflexed view of the distal rectum and anal verge  was normal and       showed no anal or rectal abnormalities. Impression:               - One 5 mm polyp in the distal sigmoid colon,                            removed with a hot snare. Resected and retrieved.                           - One small polyp in the distal sigmoid colon,                            removed with a cold snare. Resected and retrieved. Moderate Sedation:      Moderate (conscious) sedation was administered by the  endoscopy nurse       and supervised by the endoscopist. The following parameters were       monitored: oxygen saturation, heart rate, blood pressure, CO2       capnography and response to care. Total physician intraservice time was       39 minutes. Recommendation:           - Patient has a contact number available for                            emergencies. The signs and symptoms of potential                            delayed complications were discussed with the                            patient. Return to normal activities tomorrow.                            Written discharge instructions were provided to the                            patient.                           - Patient has a contact number available for                            emergencies. The signs and symptoms of potential                            delayed complications were discussed with the                            patient. Return to normal activities tomorrow.                            Written discharge instructions were provided to the                            patient.                           -  Resume previous diet today.                           - Continue present medications.                           - No aspirin, ibuprofen, naproxen, or other                            non-steroidal anti-inflammatory drugs for 7 days.                           - Await pathology results.                           - Repeat colonoscopy is recommended. The                            colonoscopy date will be determined after pathology                            results from today's exam become available for                            review. Procedure Code(s):        --- Professional ---                           [id number], Colonoscopy, flexible; with removal of                            tumor(s), polyp(s), or other lesion(s) by snare                            technique                           99153, Moderate  sedation; each additional 15                            minutes intraservice time                           99153, Moderate sedation; each additional 15                            minutes intraservice time                           G0500, Moderate sedation services provided by the                            same physician or other qualified health care                            professional performing a gastrointestinal  endoscopic service that sedation supports,                            requiring the presence of an independent trained                            observer to assist in the monitoring of the                            patient's level of consciousness and physiological                            status; initial 15 minutes of intra-service time;                            patient age 81 years or older (additional time may                            be reported with [phone], as appropriate) Diagnosis Code(s):        --- Professional ---                           Z12.11, Encounter for screening for malignant                            neoplasm of colon                           K63.5, Polyp of colon CPT copyright 2019 American Medical Association. All rights reserved. The codes documented in this report are preliminary and upon coder review may  be revised to meet current compliance requirements. Hildred Laser, MD Hildred Laser, MD 09/30/2019 12:50:04 PM This report has been signed electronically. Number of Addenda: 0

## 2019-09-30 NOTE — H&P (Addendum)
Erin Sexton is an 48 y.o. female.   Chief Complaint: Patient is here for colonoscopy. HPI: Patient is 48 year old Caucasian female who is here for screening colonoscopy.  This is patient's first exam.  She denies abdominal pain change in bowel habits or rectal bleeding. Patient does not take aspirin or anticoagulants. Family history is negative for CRC.  Past Medical History:  Diagnosis Date  .       [name] Anemia   . Anxiety   . Asthma   . Blood transfusion without reported diagnosis   . Bone spur of right foot   . CHF (congestive heart failure) ([place])   . COPD (chronic obstructive pulmonary disease) ([place])   . Depression   . Diabetes mellitus without complication ([place])    a. Dx 02/2014.  [name] History of echocardiogram 2015   normal EF, Grade II diastolic dysfunction  . HOCM (hypertrophic obstructive cardiomyopathy) ([place])    a. Dx [id number].  [name] Hypertension   . Morbid obesity ([place])   . NSVT (nonsustained ventricular tachycardia) ([place])    a. Noted on tele during 02/2014 adm for symptomatic anemia.  [name] SVT (supraventricular tachycardia) ([place])    a. Noted on tele during 02/2014 adm for symptomatic anemia.  . Vaginal bleeding     Past Surgical History:  Procedure Laterality Date  . DILITATION & CURRETTAGE/HYSTROSCOPY WITH NOVASURE ABLATION N/A 08/07/2016   Procedure: DILATATION & CURETTAGE/HYSTEROSCOPY WITH ATTPEMPTED NOVASURE ABLATION AND INSERTION OF IUD;  Surgeon: Florian Buff, MD;  Location: [facility];  Service: Gynecology;  Laterality: N/A;    Family History  Problem Relation Age of Onset  . Diabetes Mother   . Arthritis Mother   . Other Mother        WPW  . Mental illness Mother   . Mental illness Sister   . Bipolar disorder Sister   . Uterine cancer Maternal Grandmother   . Mental illness Maternal Grandmother   . CAD Neg Hx        No family history of EARLY CAD   Social History:  reports that she has never smoked. She has never used smokeless tobacco. She reports that she  does not drink alcohol or use drugs.  Allergies: No Known Allergies  Medications Prior to Admission  Medication Sig Dispense Refill  . albuterol (PROAIR HFA) 108 (90 Base) MCG/ACT inhaler Inhale 2 puffs into the lungs as needed (wheezing).     [name] albuterol (PROVENTIL) (5 MG/ML) 0.5% nebulizer solution Take 0.5 mLs (2.5 mg total) by nebulization every 6 (six) hours as needed for wheezing or shortness of breath. 20 mL 0  . atorvastatin (LIPITOR) 80 MG tablet Take 80 mg by mouth daily.   3  . buPROPion (WELLBUTRIN XL) 300 MG 24 hr tablet Take 1 tablet (300 mg total) by mouth daily. 30 tablet 0  . Cholecalciferol (VITAMIN D PO) Take 15,000 Units by mouth daily.     . ciprofloxacin (CIPRO) 500 MG tablet Take 1 tablet (500 mg total) by mouth 2 (two) times daily. 6 tablet 0  . estradiol (ESTRACE) 2 MG tablet Take 1 tablet (2 mg total) by mouth daily. (Patient taking differently: Take 2 mg by mouth at bedtime. ) 90 tablet 3  . glipiZIDE (GLUCOTROL) 5 MG tablet Take 1 tablet (5 mg total) by mouth 2 (two) times daily before a meal. 60 tablet 3  . lisinopril (ZESTRIL) 2.5 MG tablet Take 2.5 mg by mouth every evening.     . megestrol (MEGACE) 40  MG tablet 1 tablet daily (Patient taking differently: Take 40 mg by mouth at bedtime. ) 30 tablet 3  . metFORMIN (GLUCOPHAGE) 500 MG tablet Take 1 tablet (500 mg total) by mouth 2 (two) times daily with a meal. 60 tablet 0  . metoprolol (LOPRESSOR) 50 MG tablet Take 50 mg by mouth 2 (two) times daily.     . phenazopyridine (PYRIDIUM) 100 MG tablet Take 1 tablet (100 mg total) by mouth 3 (three) times daily as needed for pain. 10 tablet 0  . potassium chloride SA (K-DUR,KLOR-CON) 20 MEQ tablet Take 20 mEq by mouth 2 (two) times daily.     . SYMBICORT 160-4.5 MCG/ACT inhaler Inhale 2 puffs into the lungs daily.     [name] thyroid (NP THYROID) 120 MG tablet Take 120 mg by mouth daily before breakfast. Take with  30 mg for a total of 150 mg    . thyroid (NP THYROID) 30 MG  tablet Take 30 mg by mouth daily before breakfast. Take with 120 mg for a total of 150 mg    . torsemide (DEMADEX) 20 MG tablet Take 1 tablet (20 mg total) by mouth daily. (Patient taking differently: Take 20 mg by mouth 2 (two) times daily. ) 90 tablet 3  . megestrol (MEGACE) 40 MG tablet 3 tablets a day for 5 days, 2 tablets a day for 5 days then 1 tablet daily 45 tablet 3  . methocarbamol (ROBAXIN) 500 MG tablet Take 2 tablets (1,000 mg total) by mouth 4 (four) times daily as needed for muscle spasms (muscle spasm/pain). 25 tablet 0    Results for orders placed or performed during the hospital encounter of 09/30/19 (from the past 48 hour(s))  Glucose, capillary     Status: Abnormal   Collection Time: 09/30/19  7:05 AM  Result Value Ref Range   Glucose-Capillary 181 (H) 70 - 99 mg/dL   No results found.  ROS  Blood pressure (!) 134/58, pulse 81, temperature 99.2 F (37.3 C), temperature source Oral, resp. rate (!) 21, last menstrual period 06/04/2014, SpO2 97 %. Physical Exam  Constitutional:  Well-developed obese Caucasian female.  HENT:  Mouth/Throat: Oropharynx is clear and moist.  Eyes: Conjunctivae are normal. No scleral icterus.  Neck: No thyromegaly present.  Cardiovascular: Normal rate, regular rhythm and normal heart sounds.  No murmur heard. Respiratory: Effort normal and breath sounds normal.  GI:  Abdomen is protuberant.  She has umbilical hernia.  Abdomen is somewhat soft and nontender with organomegaly or masses  Musculoskeletal:     Comments: Nonpitting pretibial edema.  Lymphadenopathy:    She has no cervical adenopathy.  Neurological: She is alert.  Skin: Skin is warm and dry.     Assessment/Plan Average risk screening colonoscopy.  Hildred Laser, MD 09/30/2019, 11:57 AM

## 2019-10-01 LAB — SURGICAL PATHOLOGY

## 2019-10-03 NOTE — Telephone Encounter (Signed)
Okay to schedule colonoscopy with conscious sedation 

## 2019-10-06 ENCOUNTER — Encounter (HOSPITAL_COMMUNITY): Payer: Self-pay | Admitting: Internal Medicine

## 2019-10-06 ENCOUNTER — Telehealth (INDEPENDENT_AMBULATORY_CARE_PROVIDER_SITE_OTHER): Payer: Self-pay | Admitting: Internal Medicine

## 2019-10-06 NOTE — Telephone Encounter (Signed)
Patient left voice mail message stating she had a colonoscopy on 10/22 -  Patient also stated she has some questions to ask - did not say specifically what questions  -  Please advise - ph# [phone]

## 2019-10-06 NOTE — Telephone Encounter (Signed)
Patient was called and a message was left asking that she call our office back so that we may get the questions she has so that they may be addressed with Dr.Rehman.

## 2019-10-12 ENCOUNTER — Other Ambulatory Visit (INDEPENDENT_AMBULATORY_CARE_PROVIDER_SITE_OTHER): Payer: Self-pay | Admitting: Internal Medicine

## 2019-10-22 ENCOUNTER — Encounter (INDEPENDENT_AMBULATORY_CARE_PROVIDER_SITE_OTHER): Payer: Self-pay | Admitting: Internal Medicine

## 2019-10-22 ENCOUNTER — Other Ambulatory Visit (INDEPENDENT_AMBULATORY_CARE_PROVIDER_SITE_OTHER): Payer: Self-pay | Admitting: Internal Medicine

## 2019-10-22 MED ORDER — BLOOD GLUCOSE MONITOR KIT: PACK | 0 refills | Status: DC

## 2019-10-25 ENCOUNTER — Ambulatory Visit (INDEPENDENT_AMBULATORY_CARE_PROVIDER_SITE_OTHER): Payer: Medicare Other | Admitting: Cardiovascular Disease

## 2019-10-25 ENCOUNTER — Other Ambulatory Visit (INDEPENDENT_AMBULATORY_CARE_PROVIDER_SITE_OTHER): Payer: Self-pay | Admitting: Internal Medicine

## 2019-10-25 ENCOUNTER — Encounter: Payer: Self-pay | Admitting: Cardiovascular Disease

## 2019-10-25 VITALS — Ht 69.0 in | Wt 348.0 lb

## 2019-10-25 DIAGNOSIS — I421 Obstructive hypertrophic cardiomyopathy: Secondary | ICD-10-CM | POA: Diagnosis not present

## 2019-10-25 DIAGNOSIS — E785 Hyperlipidemia, unspecified: Secondary | ICD-10-CM

## 2019-10-25 DIAGNOSIS — I10 Essential (primary) hypertension: Secondary | ICD-10-CM

## 2019-10-25 DIAGNOSIS — I110 Hypertensive heart disease with heart failure: Secondary | ICD-10-CM

## 2019-10-25 DIAGNOSIS — G4733 Obstructive sleep apnea (adult) (pediatric): Secondary | ICD-10-CM | POA: Diagnosis not present

## 2019-10-25 DIAGNOSIS — I5032 Chronic diastolic (congestive) heart failure: Secondary | ICD-10-CM

## 2019-10-25 MED ORDER — BLOOD GLUCOSE MONITOR KIT: PACK | 0 refills | Status: DC

## 2019-10-25 NOTE — Progress Notes (Signed)
Virtual Visit via Telephone Note   This visit type was conducted due to national recommendations for restrictions regarding the COVID-19 Pandemic (e.g. social distancing) in an effort to limit this patient's exposure and mitigate transmission in our community.  Due to her co-morbid illnesses, this patient is at least at moderate risk for complications without adequate follow up.  This format is felt to be most appropriate for this patient at this time.  The patient did not have access to video technology/had technical difficulties with video requiring transitioning to audio format only (telephone).  All issues noted in this document were discussed and addressed.  No physical exam could be performed with this format.  Please refer to the patient's chart for her  consent to telehealth for [facility].   Date:  10/25/2019   ID:  Erin Sexton, DOB [birth year], MRN [id number]  Patient Location: Home Provider Location: Office  PCP:  Doree Albee, MD  Cardiologist:  Kate Sable, MD  Electrophysiologist:  None   Evaluation Performed:  Follow-Up Visit  Chief Complaint:  HCM  History of Present Illness:    Erin Sexton is a 48 y.o. female with past medical history of hypertrophic cardiomyopathy, chronic diastolic CHF, HTN, HLD, and OSA (noncompliant with CPAP).  Deep breathing alleviates any shortness of breath.  Denies problems with leg swelling.  She has no complaints.   Past Medical History:  Diagnosis Date  . Abnormal CT scan    a. 02/2014: evaluated for dyspnea and initially placed on Xarelto for PE because radiologist could not exclude small peripheral pulmonary emboli because of limited contrast. However, after admission for ++vaginal bleeding several days later, it was felt she did NOT have PE - underwent further testing with neg VQ 03/06/14, neg LE duplex.  . Anemia   . Anxiety   . Asthma   . Blood transfusion without reported diagnosis   . Bone spur of  right foot   . CHF (congestive heart failure) ([place])   . COPD (chronic obstructive pulmonary disease) ([place])   . Depression   . Diabetes mellitus without complication ([place])    a. Dx 02/2014.  [name] History of echocardiogram 2015   normal EF, Grade II diastolic dysfunction  . HOCM (hypertrophic obstructive cardiomyopathy) ([place])    a. Dx [address]277.  [name] Hypertension   . Morbid obesity ([place])   . NSVT (nonsustained ventricular tachycardia) ([place])    a. Noted on tele during 02/2014 adm for symptomatic anemia.  [name] SVT (supraventricular tachycardia) ([place])    a. Noted on tele during 02/2014 adm for symptomatic anemia.  . Vaginal bleeding    Past Surgical History:  Procedure Laterality Date  . COLONOSCOPY N/A 09/30/2019   Procedure: COLONOSCOPY;  Surgeon: Rogene Houston, MD;  Location: [facility] ENDO SU[facility];  Service: Endoscopy;  Laterality: N/A;  730  . DILITATION & CURRETTAGE/HYSTROSCOPY WITH NOVASURE ABLATION N/A 08/07/2016   Procedure: DILATATION & CURETTAGE/HYSTEROSCOPY WITH ATTPEMPTED NOVASURE ABLATION AND INSERTION OF IUD;  Surgeon: Florian Buff, MD;  Location: [facility] ORS;  Service: Gynecology;  Laterality: N/A;     Current Meds  Medication Sig  . albuterol (PROAIR HFA) 108 (90 Base) MCG/ACT inhaler Inhale 2 puffs into the lungs as needed (wheezing).   [name] albuterol (PROVENTIL) (5 MG/ML) 0.5% nebulizer solution Take 0.5 mLs (2.5 mg total) by nebulization every 6 (six) hours as needed for wheezing or shortness of breath.  [name] atorvastatin (LIPITOR) 80 MG tablet Take 80 mg by mouth daily.   [name]  blood glucose meter kit and supplies KIT Dispense based on patient and insurance preference. Use once a day. Diagnosis code:E11.9  . buPROPion (WELLBUTRIN XL) 300 MG 24 hr tablet Take 1 tablet (300 mg total) by mouth daily.  . Cholecalciferol (VITAMIN D PO) Take 15,000 Units by mouth daily.   [name] estradiol (ESTRACE) 2 MG tablet Take 1 tablet (2 mg total) by mouth daily. (Patient taking differently: Take 2 mg by mouth at bedtime.  )  . glipiZIDE (GLUCOTROL) 5 MG tablet Take 1 tablet (5 mg total) by mouth 2 (two) times daily before a meal.  . lisinopril (ZESTRIL) 2.5 MG tablet Take 1 tablet by mouth once daily  . megestrol (MEGACE) 40 MG tablet 1 tablet daily (Patient taking differently: Take 40 mg by mouth at bedtime. )  . metFORMIN (GLUCOPHAGE) 500 MG tablet Take 1 tablet (500 mg total) by mouth 2 (two) times daily with a meal.  . methocarbamol (ROBAXIN) 500 MG tablet Take 2 tablets (1,000 mg total) by mouth 4 (four) times daily as needed for muscle spasms (muscle spasm/pain).  . metoprolol tartrate (LOPRESSOR) 50 MG tablet Take 1 tablet by mouth twice daily  . potassium chloride SA (K-DUR,KLOR-CON) 20 MEQ tablet Take 20 mEq by mouth 2 (two) times daily.   . SYMBICORT 160-4.5 MCG/ACT inhaler Inhale 2 puffs into the lungs daily.   [name] thyroid (NP THYROID) 120 MG tablet Take 120 mg by mouth daily before breakfast. Take with  30 mg for a total of 150 mg  . thyroid (NP THYROID) 30 MG tablet Take 30 mg by mouth daily before breakfast. Take with 120 mg for a total of 150 mg  . torsemide (DEMADEX) 20 MG tablet Take 20 mg by mouth 2 (two) times daily.     Allergies:   Patient has no known allergies.   Social History   Tobacco Use  . Smoking status: Never Smoker  . Smokeless tobacco: Never Used  Substance Use Topics  . Alcohol use: No  . Drug use: No     Family Hx: The patient's family history includes Arthritis in her mother; Bipolar disorder in her sister; Diabetes in her mother; Mental illness in her maternal grandmother, mother, and sister; Other in her mother; Uterine cancer in her maternal grandmother. There is no history of CAD.  ROS:   Please see the history of present illness.     All other systems reviewed and are negative.   Prior CV studies:   The following studies were reviewed today:  NA  Labs/Other Tests and Data Reviewed:    EKG:  No ECG reviewed.  Recent Labs: 01/31/2019: B Natriuretic  Peptide 29.0; BUN 18; Creatinine, Ser 1.31; Hemoglobin 14.4; Platelets 236; Potassium 3.6; Sodium 138   Recent Lipid Panel Lab Results  Component Value Date/Time   CHOL 97 06/21/2016 05:10 AM   TRIG 91 06/21/2016 05:10 AM   HDL 25 (L) 06/21/2016 05:10 AM   CHOLHDL 3.9 06/21/2016 05:10 AM   LDLCALC 54 06/21/2016 05:10 AM    Wt Readings from Last 3 Encounters:  10/25/19 (!) 348 lb (157.9 kg)  09/27/19 (!) 349 lb (158.3 kg)  08/17/19 (!) 352 lb 3.2 oz (159.8 kg)     Objective:    Vital Signs:  Ht '5\' 9"'$  (1.753 m)   Wt (!) 348 lb (157.9 kg)   LMP 06/04/2014   BMI 51.39 kg/m    VITAL SIGNS:  reviewed  ASSESSMENT & PLAN:    1. Chronic diastolic HF/HCM:  Continue torsemide 20 mg bid.  2. HTN: No changes.  3. HLD: continue atorvastatin.  4. Morbid obesity: Needs aggressive weight loss.  5. OSA: Needs CPAP.   COVID-19 Education: The signs and symptoms of COVID-19 were discussed with the patient and how to seek care for testing (follow up with PCP or arrange E-visit).  The importance of social distancing was discussed today.  Time:   Today, I have spent 5 minutes with the patient with telehealth technology discussing the above problems.     Medication Adjustments/Labs and Tests Ordered: Current medicines are reviewed at length with the patient today.  Concerns regarding medicines are outlined above.   Tests Ordered: No orders of the defined types were placed in this encounter.   Medication Changes: No orders of the defined types were placed in this encounter.   Follow Up:  Either In Person or Virtual in 1 year(s)  Signed, Kate Sable, MD  10/25/2019 12:08 PM    [place]

## 2019-10-26 ENCOUNTER — Other Ambulatory Visit (INDEPENDENT_AMBULATORY_CARE_PROVIDER_SITE_OTHER): Payer: Self-pay | Admitting: Internal Medicine

## 2019-10-26 MED ORDER — BLOOD GLUCOSE MONITOR KIT: PACK | 0 refills | Status: DC

## 2019-11-07 ENCOUNTER — Other Ambulatory Visit (INDEPENDENT_AMBULATORY_CARE_PROVIDER_SITE_OTHER): Payer: Self-pay | Admitting: Internal Medicine

## 2019-11-08 ENCOUNTER — Telehealth (INDEPENDENT_AMBULATORY_CARE_PROVIDER_SITE_OTHER): Payer: Self-pay

## 2019-11-08 NOTE — Telephone Encounter (Signed)
Yes this is correct, glipizide 5 mg, 1 tablet twice a day.

## 2019-11-11 ENCOUNTER — Encounter (INDEPENDENT_AMBULATORY_CARE_PROVIDER_SITE_OTHER): Payer: Self-pay | Admitting: Internal Medicine

## 2019-11-11 ENCOUNTER — Telehealth (INDEPENDENT_AMBULATORY_CARE_PROVIDER_SITE_OTHER): Payer: Medicare Other | Admitting: Internal Medicine

## 2019-11-11 ENCOUNTER — Other Ambulatory Visit: Payer: Self-pay

## 2019-11-11 DIAGNOSIS — I10 Essential (primary) hypertension: Secondary | ICD-10-CM | POA: Diagnosis not present

## 2019-11-11 DIAGNOSIS — E119 Type 2 diabetes mellitus without complications: Secondary | ICD-10-CM | POA: Diagnosis not present

## 2019-11-11 NOTE — Progress Notes (Signed)
Metrics: Intervention Frequency ACO  Documented Smoking Status Yearly  Screened one or more times in 24 months  Cessation Counseling or  Active cessation medication Past 24 months  Past 24 months   Guideline developer: UpToDate (See UpToDate for funding source) Date Released: 2014       Wellness Office Visit  Subjective:  Patient ID: Erin Sexton, female    DOB: Sep 16, 1971  Age: 48 y.o. MRN: [id number]  CC: This was an audio telemedicine visit with the permission of the patient who is at home and I am in my office.  I was able to easily recognize her voice. This visit was to follow-up on her chronic conditions which include morbid obesity, diabetes, hypertension. HPI  Unfortunately, although she has lost weight of about 3 pounds, she has not been losing further weight.  She has been doing intermittent fasting 24 hours every other day it seems but has not been very successful at losing much more weight.  She is able to tolerate 24 fasting. She is also complaining of almost explosive diarrhea after she eats for the last couple of months on several occasions and she was wondering if this is related to anything serious.  She did have a colonoscopy by Dr. Laural Golden and this was apparently normal/negative. Past Medical History:  Diagnosis Date  . Abnormal CT scan    a. 02/2014: evaluated for dyspnea and initially placed on Xarelto for PE because radiologist could not exclude small peripheral pulmonary emboli because of limited contrast. However, after admission for ++vaginal bleeding several days later, it was felt she did NOT have PE - underwent further testing with neg VQ 03/06/14, neg LE duplex.  . Anemia   . Anxiety   . Asthma   . Blood transfusion without reported diagnosis   . Bone spur of right foot   . CHF (congestive heart failure) ([place])   . COPD (chronic obstructive pulmonary disease) ([place])   . Depression   . Diabetes mellitus without complication ([place])    a. Dx 02/2014.  [name]  History of echocardiogram 2015   normal EF, Grade II diastolic dysfunction  . HOCM (hypertrophic obstructive cardiomyopathy) ([place])    a. Dx [address]209.  [name] Hypertension   . Morbid obesity ([place])   . NSVT (nonsustained ventricular tachycardia) ([place])    a. Noted on tele during 02/2014 adm for symptomatic anemia.  [name] SVT (supraventricular tachycardia) ([place])    a. Noted on tele during 02/2014 adm for symptomatic anemia.  . Vaginal bleeding       Family History  Problem Relation Age of Onset  . Diabetes Mother   . Arthritis Mother   . Other Mother        WPW  . Mental illness Mother   . Mental illness Sister   . Bipolar disorder Sister   . Uterine cancer Maternal Grandmother   . Mental illness Maternal Grandmother   . CAD Neg Hx        No family history of EARLY CAD    Social History   Social History Narrative   Divorced for 3 years,married for 5 years.On disability since 2009 secondary to mental illness.   Social History   Tobacco Use  . Smoking status: Never Smoker  . Smokeless tobacco: Never Used  Substance Use Topics  . Alcohol use: No    Current Meds  Medication Sig  . albuterol (PROAIR HFA) 108 (90 Base) MCG/ACT inhaler Inhale 2 puffs into the lungs as needed (wheezing).   [name]  albuterol (PROVENTIL) (5 MG/ML) 0.5% nebulizer solution Take 0.5 mLs (2.5 mg total) by nebulization every 6 (six) hours as needed for wheezing or shortness of breath.  [name] atorvastatin (LIPITOR) 80 MG tablet Take 80 mg by mouth daily.   . blood glucose meter kit and supplies KIT Dispense based on patient and insurance preference. Use once a day. Diagnosis code:E11.9  . buPROPion (WELLBUTRIN XL) 300 MG 24 hr tablet Take 1 tablet by mouth once daily  . Cholecalciferol (VITAMIN D PO) Take 15,000 Units by mouth daily.   [name] estradiol (ESTRACE) 2 MG tablet Take 1 tablet (2 mg total) by mouth daily. (Patient taking differently: Take 2 mg by mouth at bedtime. )  . glipiZIDE (GLUCOTROL) 5 MG tablet Take 1 tablet  (5 mg total) by mouth 2 (two) times daily before a meal.  . lisinopril (ZESTRIL) 2.5 MG tablet Take 1 tablet by mouth once daily  . megestrol (MEGACE) 40 MG tablet 1 tablet daily (Patient taking differently: Take 40 mg by mouth at bedtime. )  . metFORMIN (GLUCOPHAGE) 500 MG tablet Take 1 tablet (500 mg total) by mouth 2 (two) times daily with a meal.  . methocarbamol (ROBAXIN) 500 MG tablet Take 2 tablets (1,000 mg total) by mouth 4 (four) times daily as needed for muscle spasms (muscle spasm/pain).  . metoprolol tartrate (LOPRESSOR) 50 MG tablet Take 1 tablet by mouth twice daily  . potassium chloride SA (K-DUR,KLOR-CON) 20 MEQ tablet Take 20 mEq by mouth 2 (two) times daily.   . SYMBICORT 160-4.5 MCG/ACT inhaler Inhale 2 puffs into the lungs daily.   [name] thyroid (NP THYROID) 120 MG tablet Take 120 mg by mouth daily before breakfast. Take with  30 mg for a total of 150 mg  . thyroid (NP THYROID) 30 MG tablet Take 30 mg by mouth daily before breakfast. Take with 120 mg for a total of 150 mg  . torsemide (DEMADEX) 20 MG tablet Take 20 mg by mouth 2 (two) times daily.      Objective:   Today's Vitals: LMP 06/04/2014  Vitals with BMI 10/25/2019 09/30/2019 09/30/2019  Height 5' 9" - -  Weight 348 lbs - -  BMI 18.56 - -  Systolic - 314 970  Diastolic - 57 67  Pulse - 79 82  Some encounter information is confidential and restricted. Go to Review Flowsheets activity to see all data.     Physical Exam  She appeared to be alert and orientated on the phone.     Assessment   1. Essential hypertension, benign   2. Morbid obesity ([place])   3. Diabetes mellitus type 2, noninsulin dependent ([place])       Tests ordered No orders of the defined types were placed in this encounter.    Plan: 1. She will continue with all medications for chronic conditions above. 2. I have told her to come into the office physically to be seen early next week so we can reevaluate her as well as address her  bowel symptoms. 3. This phone call lasted 9 minutes.   No orders of the defined types were placed in this encounter.   Doree Albee, MD

## 2019-11-15 ENCOUNTER — Encounter (INDEPENDENT_AMBULATORY_CARE_PROVIDER_SITE_OTHER): Payer: Self-pay | Admitting: Internal Medicine

## 2019-11-15 ENCOUNTER — Other Ambulatory Visit: Payer: Self-pay

## 2019-11-15 ENCOUNTER — Other Ambulatory Visit (INDEPENDENT_AMBULATORY_CARE_PROVIDER_SITE_OTHER): Payer: Self-pay | Admitting: Internal Medicine

## 2019-11-15 ENCOUNTER — Ambulatory Visit (INDEPENDENT_AMBULATORY_CARE_PROVIDER_SITE_OTHER): Payer: Medicare Other | Admitting: Internal Medicine

## 2019-11-15 VITALS — BP 142/88 | HR 76 | Temp 97.1°F | Resp 20 | Ht 69.0 in | Wt 348.0 lb

## 2019-11-15 DIAGNOSIS — I10 Essential (primary) hypertension: Secondary | ICD-10-CM

## 2019-11-15 DIAGNOSIS — E119 Type 2 diabetes mellitus without complications: Secondary | ICD-10-CM

## 2019-11-15 DIAGNOSIS — E559 Vitamin D deficiency, unspecified: Secondary | ICD-10-CM

## 2019-11-15 MED ORDER — GLIPIZIDE 5 MG PO TABS: 5.0000 mg | ORAL_TABLET | Freq: Two times a day (BID) | ORAL | 3 refills | Status: DC

## 2019-11-15 NOTE — Progress Notes (Signed)
Metrics: Intervention Frequency ACO  Documented Smoking Status Yearly  Screened one or more times in 24 months  Cessation Counseling or  Active cessation medication Past 24 months  Past 24 months   Guideline developer: UpToDate (See UpToDate for funding source) Date Released: 2014       Wellness Office Visit  Subjective:  Patient ID: Erin Sexton, female    DOB: September 03, 1971  Age: 48 y.o. MRN: [id number]  CC: This lady comes in for follow-up of morbid obesity, diabetes, history of congestive heart failure, hyperlipidemia, vitamin D deficiency and symptoms of thyroid deficiency. HPI  She has been doing quite well and she has now been fasting just over 48 hours and does not feel badly at all.  She is drinking plenty of water.  She has already lost some weight in the last couple of days she says according to her scales at home. She has had some sinus congestion but no fever whatsoever and she has had symptoms for about a week.  Unfortunately, during Thanksgiving, there were 9 people where she was gathered.  Apparently not a single person from that gathering has been tested for Covid positive. She continues with oral hypoglycemic agents. She continues with statin therapy without myalgias. She denies any chest pain, dyspnea, palpitations or limb weakness. She also describes almost explosive watery diarrhea whenever she opens her bowels although since she has been fasting for the last 48 hours or so, she has really not opened her bowels.  She gets cramping when she does open her bowels.  There is no blood in the stool.  She has had a colonoscopy recently without any major abnormalities apart from benign polyps.  She denies any nausea or vomiting or abdominal pain in between the diarrhea episodes. Past Medical History:  Diagnosis Date   Abnormal CT scan    a. 02/2014: evaluated for dyspnea and initially placed on Xarelto for PE because radiologist could not exclude small peripheral pulmonary  emboli because of limited contrast. However, after admission for ++vaginal bleeding several days later, it was felt she did NOT have PE - underwent further testing with neg VQ 03/06/14, neg LE duplex.   Anemia    Anxiety    Asthma    Blood transfusion without reported diagnosis    Bone spur of right foot    CHF (congestive heart failure) ([facility])    COPD (chronic obstructive pulmonary disease) ([place])    Depression    Diabetes mellitus without complication ([place])    a. Dx 02/2014.   History of echocardiogram 2015   normal EF, Grade II diastolic dysfunction   HOCM (hypertrophic obstructive cardiomyopathy) ([place])    a. Dx [address]9.   Hypertension    Morbid obesity ([place])    NSVT (nonsustained ventricular tachycardia) ([place])    a. Noted on tele during 02/2014 adm for symptomatic anemia.   SVT (supraventricular tachycardia) ([place])    a. Noted on tele during 02/2014 adm for symptomatic anemia.   Vaginal bleeding       Family History  Problem Relation Age of Onset   Diabetes Mother    Arthritis Mother    Other Mother        WPW   Mental illness Mother    Mental illness Sister    Bipolar disorder Sister    Uterine cancer Maternal Grandmother    Mental illness Maternal Grandmother    CAD Neg Hx        No family history of EARLY CAD  Social History   Social History Narrative   Divorced for 3 years,married for 5 years.On disability since 2009 secondary to mental illness.   Social History   Tobacco Use   Smoking status: Never Smoker   Smokeless tobacco: Never Used  Substance Use Topics   Alcohol use: No    Current Meds  Medication Sig   albuterol (PROAIR HFA) 108 (90 Base) MCG/ACT inhaler Inhale 2 puffs into the lungs as needed (wheezing).    albuterol (PROVENTIL) (5 MG/ML) 0.5% nebulizer solution Take 0.5 mLs (2.5 mg total) by nebulization every 6 (six) hours as needed for wheezing or shortness of breath.   atorvastatin (LIPITOR) 80 MG tablet Take 80  mg by mouth daily.    blood glucose meter kit and supplies KIT Dispense based on patient and insurance preference. Use once a day. Diagnosis code:E11.9   buPROPion (WELLBUTRIN XL) 300 MG 24 hr tablet Take 1 tablet by mouth once daily   Cholecalciferol (VITAMIN D PO) Take 15,000 Units by mouth daily.    estradiol (ESTRACE) 2 MG tablet Take 1 tablet (2 mg total) by mouth daily. (Patient taking differently: Take 2 mg by mouth at bedtime. )   glipiZIDE (GLUCOTROL) 5 MG tablet Take 1 tablet (5 mg total) by mouth 2 (two) times daily before a meal.   lisinopril (ZESTRIL) 2.5 MG tablet Take 1 tablet by mouth once daily   megestrol (MEGACE) 40 MG tablet 1 tablet daily (Patient taking differently: Take 40 mg by mouth at bedtime. )   metFORMIN (GLUCOPHAGE) 500 MG tablet Take 1 tablet (500 mg total) by mouth 2 (two) times daily with a meal.   metoprolol tartrate (LOPRESSOR) 50 MG tablet Take 1 tablet by mouth twice daily   potassium chloride SA (K-DUR,KLOR-CON) 20 MEQ tablet Take 20 mEq by mouth 2 (two) times daily.    SYMBICORT 160-4.5 MCG/ACT inhaler Inhale 2 puffs into the lungs daily.    thyroid (NP THYROID) 120 MG tablet Take 120 mg by mouth daily before breakfast. Take with  30 mg for a total of 150 mg   thyroid (NP THYROID) 30 MG tablet Take 30 mg by mouth daily before breakfast. Take with 120 mg for a total of 150 mg   torsemide (DEMADEX) 20 MG tablet Take 20 mg by mouth 2 (two) times daily.     Nutrition  Intermittent fasting combined with a low carbohydrate, moderate protein and a higher fat diet combining plant-based whole foods. Sleep  Adequate.  Exercise  None regular. Bio Identical Hormones  This patient is being treated with desiccated thyroid, off label, for symptoms of thyroid deficiency.  The patient has been counseled regarding side effects and how to deal with them.  Objective:   Today's Vitals: BP (!) 142/88 (BP Location: Left Arm, Patient Position: Sitting,  Cuff Size: Normal)    Pulse 76    Temp (!) 97.1 F (36.2 C) (Temporal)    Resp 20    Ht _0  (1.753 m)    Wt (!) 348 lb (157.9 kg) Comment: home wt 345.4lb   LMP 06/04/2014    SpO2 98%    BMI 51.39 kg/m  Vitals with BMI 11/15/2019 11/11/2019 10/25/2019  Height _1  (No Data) _2   Weight 348 lbs (No Data) 348 lbs  BMI 29.93 - 71.69  Systolic 678 (No Data) -  Diastolic 88 (No Data) -  Pulse 76 - -  Some encounter information is confidential and restricted. Go to Review Flowsheets activity to  see all data.     Physical Exam  She looks systemically well.  Blood pressure slightly elevated today.  She is alert and orientated without any focal neurological signs.     Assessment   1. Essential hypertension, benign   2. Diabetes mellitus type 2, noninsulin dependent ([place])   3. Morbid obesity ([place])   4. Vitamin D deficiency disease       Tests ordered Orders Placed This Encounter  Procedures   CMP with eGFR([facility])   Hemoglobin A1c   Vitamin D, 25-hydroxy   T3, Free   T4   TSH     Plan: 1. I told her that we will continue to monitor the diarrhea episodes.  I suggested she might want to try probiotics.  I do not think she needs further gastroenterology investigation at this point although she may if she continues with the symptoms. 2. Blood work is ordered above. 3. She will continue with her antihypertensive therapy as before. 4. She will continue with oral hypoglycemic agents. 5. She will continue with thyroid medicine as before. 6. She will continue with vitamin D3 supplementation as before. 7. Further recommendations will depend on blood results and I will see her for follow-up in about 3 months time.   No orders of the defined types were placed in this encounter.   Doree Albee, MD

## 2019-11-15 NOTE — Patient Instructions (Signed)
[facility] Dietary Recommendations for Weight Loss What to Avoid . Avoid added sugars o Often added sugar can be found in processed foods such as many condiments, dry cereals, cakes, cookies, chips, crisps, crackers, candies, sweetened drinks, etc.  o Read labels and AVOID/DECREASE use of foods with the following in their ingredient list: Sugar, fructose, high fructose corn syrup, sucrose, glucose, maltose, dextrose, molasses, cane sugar, brown sugar, any type of syrup, agave nectar, etc.   . Avoid snacking in between meals . Avoid foods made with flour o If you are going to eat food made with flour, choose those made with whole-grains; and, minimize your consumption as much as is tolerable . Avoid processed foods o These foods are generally stocked in the middle of the grocery store. Focus on shopping on the perimeter of the grocery.  What to Include . Vegetables o GREEN LEAFY VEGETABLES: Kale, spinach, mustard greens, collard greens, cabbage, broccoli, etc. o OTHER: Asparagus, cauliflower, eggplant, carrots, peas, Brussel sprouts, tomatoes, bell peppers, zucchini, beets, cucumbers, etc. . Grains, seeds, and legumes o Beans: kidney beans, black eyed peas, garbanzo beans, black beans, pinto beans, etc. o Whole, unrefined grains: brown rice, barley, bulgur, oatmeal, etc. . Healthy fats  o Avoid highly processed fats such as vegetable oil o Examples of healthy fats: avocado, olives, virgin olive oil, dark chocolate (?72% Cocoa), nuts (peanuts, almonds, walnuts, cashews, pecans, etc.) . Low - Moderate Intake of Animal Sources of Protein o Meat sources: chicken, turkey, salmon, tuna. Limit to 4 ounces of meat at one time. o Consider limiting dairy sources, but when choosing dairy focus on: PLAIN Greek yogurt, cottage cheese, high-protein milk . Fruit o Choose berries  When to Eat . Intermittent Fasting: o Choosing not to eat for a specific time period, but DO FOCUS ON HYDRATION  when fasting o Multiple Techniques: - Time Restricted Eating: eat 3 meals in a day, each meal lasting no more than 60 minutes, no snacks between meals - 16-18 hour fast: fast for 16 to 18 hours up to 7 days a week. Often suggested to start with 2-3 nonconsecutive days per week.  . Remember the time you sleep is counted as fasting.  . Examples of eating schedule: Fast from 7:00pm-11:00am. Eat between 11:00am-7:00pm.  - 24-hour fast: fast for 24 hours up to every other day. Often suggested to start with 1 day per week . Remember the time you sleep is counted as fasting . Examples of eating schedule:  o Eating day: eat 2-3 meals on your eating day. If doing 2 meals, each meal should last no more than 90 minutes. If doing 3 meals, each meal should last no more than 60 minutes. Finish last meal by 7:00pm. o Fasting day: Fast until 7:00pm.  o IF YOU FEEL UNWELL FOR ANY REASON/IN ANY WAY WHEN FASTING, STOP FASTING BY EATING A NUTRITIOUS SNACK OR LIGHT MEAL o ALWAYS FOCUS ON HYDRATION DURING FASTS - Acceptable Hydration sources: water, broths, tea/coffee (black tea/coffee is best but using a small amount of whole-fat dairy products in coffee/tea is acceptable).  - Poor Hydration Sources: anything with sugar or artificial sweeteners added to it  These recommendations have been developed for patients that are actively receiving medical care from either Dr. [facility] or Sarah Gray, DNP, NP-C at [facility]. These recommendations are developed for patients with specific medical conditions and are not meant to be distributed or used by others that are not actively receiving care from either provider listed   above at [facility]. It is not appropriate to participate in the above eating plans without proper medical supervision.   Reference: Fung, J. The obesity code. [place]: [place]; 2016.   

## 2019-11-16 ENCOUNTER — Other Ambulatory Visit: Payer: Self-pay

## 2019-11-16 ENCOUNTER — Encounter: Payer: Self-pay | Admitting: Obstetrics & Gynecology

## 2019-11-16 ENCOUNTER — Telehealth (INDEPENDENT_AMBULATORY_CARE_PROVIDER_SITE_OTHER): Payer: Medicare Other | Admitting: Obstetrics & Gynecology

## 2019-11-16 DIAGNOSIS — N938 Other specified abnormal uterine and vaginal bleeding: Secondary | ICD-10-CM | POA: Diagnosis not present

## 2019-11-16 DIAGNOSIS — N951 Menopausal and female climacteric states: Secondary | ICD-10-CM

## 2019-11-16 LAB — COMPLETE METABOLIC PANEL WITH GFR
AG Ratio: 1.7 (calc) (ref 1.0–2.5)
ALT: 22 U/L (ref 6–29)
AST: 18 U/L (ref 10–35)
Albumin: 4 g/dL (ref 3.6–5.1)
Alkaline phosphatase (APISO): 102 U/L (ref 37–153)
BUN/Creatinine Ratio: 11 (calc) (ref 6–22)
BUN: 15 mg/dL (ref 7–25)
CO2: 25 mmol/L (ref 20–32)
Calcium: 8.6 mg/dL (ref 8.6–10.4)
Chloride: 105 mmol/L (ref 98–110)
Creat: 1.35 mg/dL — ABNORMAL HIGH (ref 0.50–1.05)
GFR, Est African American: 53 mL/min/{1.73_m2} — ABNORMAL LOW (ref >=60)
GFR, Est Non African American: 46 mL/min/{1.73_m2} — ABNORMAL LOW (ref >=60)
Globulin: 2.4 g/dL (calc) (ref 1.9–3.7)
Glucose, Bld: 233 mg/dL — ABNORMAL HIGH (ref 65–99)
Potassium: 4 mmol/L (ref 3.5–5.3)
Sodium: 141 mmol/L (ref 135–146)
Total Bilirubin: 0.8 mg/dL (ref 0.2–1.2)
Total Protein: 6.4 g/dL (ref 6.1–8.1)

## 2019-11-16 LAB — HEMOGLOBIN A1C
Hgb A1c MFr Bld: 10 % of total Hgb — ABNORMAL HIGH (ref <5.7)
Mean Plasma Glucose: 240 (calc)
eAG (mmol/L): 13.3 (calc)

## 2019-11-16 LAB — VITAMIN D 25 HYDROXY (VIT D DEFICIENCY, FRACTURES): Vit D, 25-Hydroxy: 102 ng/mL — ABNORMAL HIGH (ref 30–100)

## 2019-11-16 LAB — T3, FREE: T3, Free: 5.6 pg/mL — ABNORMAL HIGH (ref 2.3–4.2)

## 2019-11-16 LAB — TSH: TSH: 1.89 mIU/L

## 2019-11-16 LAB — T4: T4, Total: 13.3 ug/dL — ABNORMAL HIGH (ref 5.1–11.9)

## 2019-11-16 MED ORDER — ESTRADIOL 2 MG PO TABS: 2.0000 mg | ORAL_TABLET | Freq: Every day | ORAL | 11 refills | Status: DC

## 2019-11-16 MED ORDER — MEGESTROL ACETATE 40 MG PO TABS: ORAL_TABLET | ORAL | 11 refills | Status: DC

## 2019-11-16 NOTE — Progress Notes (Signed)
TELEHEALTH [place] VIRTUAL GYNECOLOGY VISIT ENCOUNTER NOTE  I connected with Erin Sexton on 11/16/19 at  3:10 PM EST by telephone/WEBEX app at home and verified that I am speaking with the correct person using two identifiers.   I discussed the limitations, risks, security and privacy concerns of performing an evaluation and management service by telephone and the availability of in person appointments. I also discussed with the patient that there may be a patient responsible charge related to this service. The patient expressed understanding and agreed to proceed.   History:  Erin Sexton is a 48 y.o. No obstetric history on file. female being evaluated today for response to megestrol for DUB on HRT. She denies any abnormal vaginal discharge, bleeding, pelvic pain or other concerns.       Past Medical History:  Diagnosis Date  . Abnormal CT scan    a. 02/2014: evaluated for dyspnea and initially placed on Xarelto for PE because radiologist could not exclude small peripheral pulmonary emboli because of limited contrast. However, after admission for ++vaginal bleeding several days later, it was felt she did NOT have PE - underwent further testing with neg VQ 03/06/14, neg LE duplex.  . Anemia   . Anxiety   . Asthma   . Blood transfusion without reported diagnosis   . Bone spur of right foot   . CHF (congestive heart failure) ([place])   . COPD (chronic obstructive pulmonary disease) ([place])   . Depression   . Diabetes mellitus without complication ([place])    a. Dx 02/2014.  [name] History of echocardiogram 2015   normal EF, Grade II diastolic dysfunction  . HOCM (hypertrophic obstructive cardiomyopathy) ([place])    a. Dx 72094.  [name] Hypertension   . Morbid obesity ([place])   . NSVT (nonsustained ventricular tachycardia) ([place])    a. Noted on tele during 02/2014 adm for symptomatic anemia.  [name] SVT (supraventricular tachycardia) ([place])    a. Noted on tele during 02/2014 adm for symptomatic anemia.  .  Vaginal bleeding    Past Surgical History:  Procedure Laterality Date  . COLONOSCOPY N/A 09/30/2019   Procedure: COLONOSCOPY;  Surgeon: Rogene Houston, MD;  [facility];  Service: Endoscopy;  Laterality: N/A;  730  . DILITATION & CURRETTAGE/HYSTROSCOPY WITH NOVASURE ABLATION N/A 08/07/2016   Procedure: DILATATION & CURETTAGE/HYSTEROSCOPY WITH ATTPEMPTED NOVASURE ABLATION AND INSERTION OF IUD;  Surgeon: Florian Buff, MD;  Location: [facility] ORS;  Service: Gynecology;  Laterality: N/A;   The following portions of the patient's history were reviewed and updated as appropriate: allergies, current medications, past family history, past medical history, past social history, past surgical history and problem list.   Health Maintenance:  Normal pap and negative HRHPV on .  Normal mammogram on .   Review of Systems:  Pertinent items noted in HPI and remainder of comprehensive ROS otherwise negative.  Physical Exam:  Physical exam deferred due to nature of the encounter  Labs and Imaging Results for orders placed or performed in visit on 11/15/19 (from the past 336 hour(s))  CMP with eGFR([facility])   Collection Time: 11/15/19 11:37 AM  Result Value Ref Range   Glucose, Bld 233 (H) 65 - 99 mg/dL   BUN 15 7 - 25 mg/dL   Creat 1.35 (H) 0.50 - 1.05 mg/dL   GFR, Est Non African American 46 (L) > OR = 60 mL/min/1.52m   GFR, Est African American 53 (L) > OR = 60 mL/min/1.727m  BUN/Creatinine  Ratio 11 6 - 22 (calc)   Sodium 141 135 - 146 mmol/L   Potassium 4.0 3.5 - 5.3 mmol/L   Chloride 105 98 - 110 mmol/L   CO2 25 20 - 32 mmol/L   Calcium 8.6 8.6 - 10.4 mg/dL   Total Protein 6.4 6.1 - 8.1 g/dL   Albumin 4.0 3.6 - 5.1 g/dL   Globulin 2.4 1.9 - 3.7 g/dL (calc)   AG Ratio 1.7 1.0 - 2.5 (calc)   Total Bilirubin 0.8 0.2 - 1.2 mg/dL   Alkaline phosphatase (APISO) 102 37 - 153 U/L   AST 18 10 - 35 U/L   ALT 22 6 - 29 U/L  Hemoglobin A1c   Collection Time: 11/15/19 11:37 AM  Result Value  Ref Range   Hgb A1c MFr Bld 10.0 (H) <5.7 % of total Hgb   Mean Plasma Glucose 240 (calc)   eAG (mmol/L) 13.3 (calc)  Vitamin D, 25-hydroxy   Collection Time: 11/15/19 11:37 AM  Result Value Ref Range   Vit D, 25-Hydroxy 102 (H) 30 - 100 ng/mL  T3, Free   Collection Time: 11/15/19 11:37 AM  Result Value Ref Range   T3, Free 5.6 (H) 2.3 - 4.2 pg/mL  T4   Collection Time: 11/15/19 11:37 AM  Result Value Ref Range   T4, Total 13.3 (H) 5.1 - 11.9 mcg/dL  TSH   Collection Time: 11/15/19 11:37 AM  Result Value Ref Range   TSH 1.89 mIU/L   No results found.     Meds ordered this encounter  Medications  . estradiol (ESTRACE) 2 MG tablet    Sig: Take 1 tablet (2 mg total) by mouth at bedtime.    Dispense:  30 tablet    Refill:  11  . megestrol (MEGACE) 40 MG tablet    Sig: 1 tablet daily    Dispense:  30 tablet    Refill:  11    No orders of the defined types were placed in this encounter.   Assessment and Plan:       ICD-10-CM   1. DUB (dysfunctional uterine bleeding)  N93.8   2. Menopausal symptom  N95.1     Meds ordered this encounter  Medications  . estradiol (ESTRACE) 2 MG tablet    Sig: Take 1 tablet (2 mg total) by mouth at bedtime.    Dispense:  30 tablet    Refill:  11  . megestrol (MEGACE) 40 MG tablet    Sig: 1 tablet daily    Dispense:  30 tablet    Refill:  11         I discussed the assessment and treatment plan with the patient. The patient was provided an opportunity to ask questions and all were answered. The patient agreed with the plan and demonstrated an understanding of the instructions.   The patient was advised to call back or seek an in-person evaluation/go to the ED if the symptoms worsen or if the condition fails to improve as anticipated.  I provided 11 minutes of non-face-to-face time during this encounter.   Florian Buff, [place] for [facility]

## 2019-11-18 ENCOUNTER — Encounter (INDEPENDENT_AMBULATORY_CARE_PROVIDER_SITE_OTHER): Payer: Self-pay | Admitting: Internal Medicine

## 2019-11-18 ENCOUNTER — Other Ambulatory Visit: Payer: Self-pay

## 2019-11-18 DIAGNOSIS — Z20822 Contact with and (suspected) exposure to covid-19: Secondary | ICD-10-CM

## 2019-11-18 DIAGNOSIS — Z20828 Contact with and (suspected) exposure to other viral communicable diseases: Secondary | ICD-10-CM | POA: Diagnosis not present

## 2019-11-19 LAB — NOVEL CORONAVIRUS, NAA: SARS-CoV-2, NAA: DETECTED — AB

## 2019-11-21 ENCOUNTER — Telehealth: Payer: Self-pay | Admitting: Unknown Physician Specialty

## 2019-11-21 NOTE — Telephone Encounter (Signed)
Called to discuss with patient about Covid symptoms and the use of bamlanivimab, a monoclonal antibody infusion for those with mild to moderate Covid symptoms and at a high risk of hospitalization.  Pt is qualified for this infusion at the [facility] center due to Diabetes   Left message to call back   

## 2019-11-22 LAB — HM DIABETES EYE EXAM

## 2019-11-24 ENCOUNTER — Encounter (INDEPENDENT_AMBULATORY_CARE_PROVIDER_SITE_OTHER): Payer: Self-pay | Admitting: Internal Medicine

## 2019-12-25 ENCOUNTER — Other Ambulatory Visit (INDEPENDENT_AMBULATORY_CARE_PROVIDER_SITE_OTHER): Payer: Self-pay | Admitting: Internal Medicine

## 2019-12-29 ENCOUNTER — Encounter (INDEPENDENT_AMBULATORY_CARE_PROVIDER_SITE_OTHER): Payer: Self-pay | Admitting: Nurse Practitioner

## 2019-12-29 ENCOUNTER — Ambulatory Visit (INDEPENDENT_AMBULATORY_CARE_PROVIDER_SITE_OTHER): Payer: Medicare Other | Admitting: Nurse Practitioner

## 2019-12-29 VITALS — Temp 99.0°F | Ht 69.0 in | Wt 346.2 lb

## 2019-12-29 DIAGNOSIS — Z0000 Encounter for general adult medical examination without abnormal findings: Secondary | ICD-10-CM

## 2019-12-29 NOTE — Progress Notes (Signed)
Due to national recommendations of social distancing related to the [place] pandemic, an audio/visual tele-health visit was felt to be the most appropriate encounter type for this patient today. I connected with  Erin Sexton on 12/29/19 utilizing audio-only technology and verified that I am speaking with the correct person using two identifiers. The patient was located at their home, and I was located at the office of [facility] during the encounter. I discussed the limitations of evaluation and management by telemedicine. The patient expressed understanding and agreed to proceed.  The patient attempted to initiate video call, however call would not connect.  Thus audio only visit was conducted.     [name] is a 49 y.o. female who presents for Medicare Annual (Subsequent) preventive examination.   Cardiac Risk Factors include: diabetes mellitus;hypertension;obesity (BMI >30kg/m2)     Objective:     Vitals: Temp 99 F (37.2 C)   Ht '5\' 9"'$  (1.753 m)   Wt (!) 346 lb 3.2 oz (157 kg)   LMP 06/04/2014   BMI 51.12 kg/m   Body mass index is 51.12 kg/m.  Advanced Directives 12/29/2019 09/30/2019 01/31/2019 02/22/2017 11/07/2016 08/07/2016 08/01/2016  Does Patient Have a Medical Advance Directive? Yes No No No No No No  Type of Advance Directive Out of facility DNR (pink MOST or yellow form) - - - - - -  Does patient want to make changes to medical advance directive? No - Patient declined - - - - - -  Would patient like information on creating a medical advance directive? - No - Patient declined - No - Patient declined No - Patient declined No - patient declined information No - patient declined information  Pre-existing out of facility DNR order (yellow form or pink MOST form) - - - - - - -    Tobacco Social History   Tobacco Use  Smoking Status Never Smoker  Smokeless Tobacco Never Used     Counseling given: Not Answered   Clinical  Intake:  Pre-visit preparation completed: Yes        Nutritional Status: BMI > 30  Obese Nutritional Risks: None Diabetes: Yes CBG done?: Yes(using patient's at-home machine) CBG resulted in Enter/ Edit results?: No(135) Did pt. bring in CBG monitor from home?: No  How often do you need to have someone help you when you read instructions, pamphlets, or other written materials from your doctor or pharmacy?: 1 - Never What is the last grade level you completed in school?: 2-year degree from college  Interpreter Needed?: No  Information entered by :: Jeralyn Ruths, NP-C  Past Medical History:  Diagnosis Date  . Abnormal CT scan    a. 02/2014: evaluated for dyspnea and initially placed on Xarelto for PE because radiologist could not exclude small peripheral pulmonary emboli because of limited contrast. However, after admission for ++vaginal bleeding several days later, it was felt she did NOT have PE - underwent further testing with neg VQ 03/06/14, neg LE duplex.  . Anemia   . Anxiety   . Asthma   . Blood transfusion without reported diagnosis   . Bone spur of right foot   . CHF (congestive heart failure) ([place])   . COPD (chronic obstructive pulmonary disease) ([place])   . Depression   . Diabetes mellitus without complication ([place])    a. Dx 02/2014.  [name] History of echocardiogram 2015   normal EF, Grade II diastolic dysfunction  . HOCM (hypertrophic obstructive cardiomyopathy) (  [place])    a. Dx [address]6789.  [name] Hypertension   . Morbid obesity ([place])   . NSVT (nonsustained ventricular tachycardia) ([place])    a. Noted on tele during 02/2014 adm for symptomatic anemia.  [name] SVT (supraventricular tachycardia) ([place])    a. Noted on tele during 02/2014 adm for symptomatic anemia.  . Vaginal bleeding    Past Surgical History:  Procedure Laterality Date  . COLONOSCOPY N/A 09/30/2019   Procedure: COLONOSCOPY;  Surgeon: Rogene Houston, MD;  [facility];  Service: Endoscopy;  Laterality: N/A;   730  . DILITATION & CURRETTAGE/HYSTROSCOPY WITH NOVASURE ABLATION N/A 08/07/2016   Procedure: DILATATION & CURETTAGE/HYSTEROSCOPY WITH ATTPEMPTED NOVASURE ABLATION AND INSERTION OF IUD;  Surgeon: Florian Buff, MD;  Location: [facility] ORS;  Service: Gynecology;  Laterality: N/A;   Family History  Problem Relation Age of Onset  . Diabetes Mother   . Arthritis Mother   . Other Mother        WPW  . Mental illness Mother        Depression  . Heart disease Father        History of coronary bypass grafting at age 96  . Mental illness Sister   . Bipolar disorder Sister   . Heart disease Sister        possibly WPW  . Uterine cancer Maternal Grandmother   . Mental illness Maternal Grandmother   . Heart disease Maternal Grandfather   . CAD Neg Hx        No family history of EARLY CAD   Social History   Socioeconomic History  . Marital status: Divorced    Spouse name: Not on file  . Number of children: 0  . Years of education: Not on file  . Highest education level: Not on file  Occupational History  . Not on file  Tobacco Use  . Smoking status: Never Smoker  . Smokeless tobacco: Never Used  Substance and Sexual Activity  . Alcohol use: No  . Drug use: No  . Sexual activity: Not Currently    Birth control/protection: I.U.D.  Other Topics Concern  . Not on file  Social History Narrative   Divorced for 3 years,married for 5 years.On disability since 2009 secondary to mental illness.   Social Determinants of Health   Financial Resource Strain:   . Difficulty of Paying Living Expenses: Not on file  Food Insecurity:   . Worried About Charity fundraiser in the Last Year: Not on file  . Ran Out of Food in the Last Year: Not on file  Transportation Needs:   . Lack of Transportation (Medical): Not on file  . Lack of Transportation (Non-Medical): Not on file  Physical Activity:   . Days of Exercise per Week: Not on file  . Minutes of Exercise per Session: Not on file  Stress:   .  Feeling of Stress : Not on file  Social Connections:   . Frequency of Communication with Friends and Family: Not on file  . Frequency of Social Gatherings with Friends and Family: Not on file  . Attends Religious Services: Not on file  . Active Member of Clubs or Organizations: Not on file  . Attends Archivist Meetings: Not on file  . Marital Status: Not on file    Outpatient Encounter Medications as of 12/29/2019  Medication Sig  . albuterol (PROAIR HFA) 108 (90 Base) MCG/ACT inhaler Inhale 2 puffs into the lungs as needed (wheezing).   [name]  albuterol (PROVENTIL) (5 MG/ML) 0.5% nebulizer solution Take 0.5 mLs (2.5 mg total) by nebulization every 6 (six) hours as needed for wheezing or shortness of breath.  [name] atorvastatin (LIPITOR) 80 MG tablet Take 80 mg by mouth daily.   . blood glucose meter kit and supplies KIT Dispense based on patient and insurance preference. Use once a day. Diagnosis code:E11.9  . buPROPion (WELLBUTRIN XL) 300 MG 24 hr tablet Take 1 tablet by mouth once daily  . Cholecalciferol (VITAMIN D PO) Take 15,000 Units by mouth daily.   [name] estradiol (ESTRACE) 2 MG tablet Take 1 tablet (2 mg total) by mouth at bedtime.  [name] glipiZIDE (GLUCOTROL) 5 MG tablet Take 1 tablet (5 mg total) by mouth 2 (two) times daily before a meal.  . lisinopril (ZESTRIL) 2.5 MG tablet Take 1 tablet by mouth once daily  . megestrol (MEGACE) 40 MG tablet 1 tablet daily  . metFORMIN (GLUCOPHAGE) 500 MG tablet Take 1 tablet (500 mg total) by mouth 2 (two) times daily with a meal.  . metoprolol tartrate (LOPRESSOR) 50 MG tablet Take 1 tablet by mouth twice daily  . potassium chloride SA (K-DUR,KLOR-CON) 20 MEQ tablet Take 20 mEq by mouth 2 (two) times daily.   [name] PREBIOTIC PRODUCT PO Take 2 capsules by mouth daily.  . SYMBICORT 160-4.5 MCG/ACT inhaler Inhale 2 puffs into the lungs daily.   [name] thyroid (NP THYROID) 120 MG tablet Take 120 mg by mouth daily before breakfast. Take with  30 mg for a  total of 150 mg  . thyroid (NP THYROID) 30 MG tablet Take 30 mg by mouth daily before breakfast. Take with 120 mg for a total of 150 mg  . torsemide (DEMADEX) 20 MG tablet Take 1 tablet by mouth twice daily  . [DISCONTINUED] buPROPion (WELLBUTRIN XL) 300 MG 24 hr tablet Take 1 tablet (300 mg total) by mouth daily.  . [DISCONTINUED] torsemide (DEMADEX) 20 MG tablet Take 20 mg by mouth 2 (two) times daily.   No facility-administered encounter medications on file as of 12/29/2019.    Activities of Daily Living In your present state of health, do you have any difficulty performing the following activities: 12/29/2019  Hearing? N  Vision? N  Difficulty concentrating or making decisions? Y  Walking or climbing stairs? N  Dressing or bathing? N  Doing errands, shopping? N  Preparing Food and eating ? N  Using the Toilet? N  In the past six months, have you accidently leaked urine? N  Do you have problems with loss of bowel control? N  Managing your Medications? N  Managing your Finances? N  Housekeeping or managing your Housekeeping? N  Some recent data might be hidden    Patient Care Team: Doree Albee, MD as PCP - General (Internal Medicine) Herminio Commons, MD as PCP - Cardiology (Cardiology)    Assessment:   This is a routine wellness examination for Erin Sexton.  Exercise Activities and Dietary recommendations Current Exercise Habits: Home exercise routine, Type of exercise: walking, Time (Minutes): 45, Frequency (Times/Week): 4, Weekly Exercise (Minutes/Week): 180, Intensity: Moderate, Exercise limited by: cardiac condition(s)  Goals    . Weight (lb) < 200 lb (90.7 kg)       Fall Risk Fall Risk  12/29/2019  Falls in the past year? 1  Number falls in past yr: 0  Injury with Fall? 1  Comment soreness; bruise; no head injury  Risk for fall due to : History of fall(s)  Follow up  Education provided;Falls prevention discussed   Is the patient's home free of loose throw  rugs in walkways, pet beds, electrical cords, etc?   yes      Grab bars in the bathroom? yes      Handrails on the stairs?   yes      Adequate lighting?   yes  Timed Get Up and Go performed: Not performed today as office visit was conducted remotely  Depression Screen PHQ 2/9 Scores 12/29/2019  PHQ - 2 Score 0     Cognitive Function     6CIT Screen 12/29/2019  What Year? 0 points  What month? 0 points  What time? 0 points  Count back from 20 0 points  Months in reverse 0 points  Repeat phrase 0 points  Total Score 0    Immunization History  Administered Date(s) Administered  . Influenza Inj Mdck Quad Pf 09/11/2016, 09/08/2018  . Influenza-Unspecified 09/09/2019  . Pneumococcal Polysaccharide-23 08/10/2015  . Td 12/09/2014    Qualifies for Shingles Vaccine?  Yes -patient will consider getting this administered at her pharmacy  Screening Tests Health Maintenance  Topic Date Due  . FOOT EXAM  12/12/1978  . PAP SMEAR-Modifier  12/29/2019 (Originally 12/12/1989)  . HEMOGLOBIN A1C  05/15/2020  . OPHTHALMOLOGY EXAM  11/21/2020  . MAMMOGRAM  07/29/2021  . TETANUS/TDAP  12/09/2024  . COLONOSCOPY  09/29/2029  . INFLUENZA VACCINE  Completed  . PNEUMOCOCCAL POLYSACCHARIDE VACCINE AGE 40-64 HIGH RISK  Completed  . HIV Screening  Completed    Cancer Screenings: Lung: Low Dose CT Chest recommended if Age 78-80 years, 30 pack-year currently smoking OR have quit w/in 15years. Patient does not qualify. Breast:  Up to date on Mammogram? Yes   Up to date of Bone Density/Dexa? No  -not currently applicable colorectal: Up-to-date patient will be due again in October 2025  Additional Screenings: Hepatitis C Screening: Would qualify for screening she will determine if she would like to have this done.     Plan:   Patient is up-to-date with immunizations other than shingles at this time.  We did discuss getting the shingles vaccine and she will consider this and if she would like it  will get done at her pharmacy.  She will be due for pneumococcal vaccination again in September of this year.  I encouraged her to let us know about this as we get closer to that time so she can have that administered.  She tells me she understands.  Patient is up-to-date with colon cancer screening, sexual transmitted infection screening, breast cancer screening, and depression screening.  She may be due for cervical cancer screening.  She is not sure when her last one was done.  I encouraged her to call her gynecologist to set this up.  She tells me she will.  She would also be due for hepatitis C screening, and she will consider whether or not she would like this completed.  Of note she did mention to me that she has been having some dizziness at home.  But she has also been experiencing a sinus infection off and on for the last couple of months.  I told her that if her dizziness worsens or if she has any worrisome symptoms such as syncope, numbness, tingling, chest pain, shortness of breath, that she should call our office to be seen earlier than her next appointment.  She tells me she understands.  I have personally reviewed and noted the following in the patient's chart:   .  Medical and social history . Use of alcohol, tobacco or illicit drugs  . Current medications and supplements . Functional ability and status . Nutritional status . Physical activity . Advanced directives . List of other physicians . Hospitalizations, surgeries, and ER visits in previous 12 months . Vitals . Screenings to include cognitive, depression, and falls . Referrals and appointments  In addition, I have reviewed and discussed with patient certain preventive protocols, quality metrics, and best practice recommendations. A written personalized care plan for preventive services as well as general preventive health recommendations were provided to patient.     Ailene Ards, NP  12/29/2019

## 2019-12-29 NOTE — Patient Instructions (Signed)
Thank you for choosing [facility] as your medical provider! If you have any questions or concerns regarding your health care, please do not hesitate to call our office.  Immunizations: You will be due for flu shot next flu season.  Pneumonia vaccine in September of this year.  If you would like the shingles vaccine please notify your pharmacist.  You will be due for tetanus shot in approximately 5 years.  Health maintenance screenings: You will be due for colonoscopy to screen for colon cancer again in October 2025.  You may be due for cervical cancer screening via Pap smear like we discussed please call your OB/GYN to get this scheduled.  You will be due for breast cancer screening via mammography again in August of this year.  If you do not hear from the imaging site to get this scheduled when you are due please let us know and we can send a referral.  If you would like to be screened for hepatitis C please let us know and we can add this to your blood work next time you are seen in our office.  Please follow-up as scheduled in 2 months. We look forward to seeing you again soon!  At [facility] we value your feedback. You may receive a survey about your visit today. Please share your experience as we strive to create trusting relationships with our patients to provide genuine, compassionate, quality care.  We appreciate your understanding and patience as we review any laboratory studies, imaging, and other diagnostic tests that are ordered as we care for you. We do our best to address any and all results in a timely manner. If you do not hear about test results within 1 week, please do not hesitate to contact us. If we referred you to a specialist during your visit or ordered imaging testing, contact the office if you have not been contacted to be scheduled within 1 weeks.  We also encourage the use of MyChart, which contains your medical information for your review as well. If  you are not enrolled in this feature, an access code is on this after visit summary for your convenience. Thank you for allowing [place] to be involved in your care.

## 2020-01-04 ENCOUNTER — Other Ambulatory Visit (INDEPENDENT_AMBULATORY_CARE_PROVIDER_SITE_OTHER): Payer: Self-pay | Admitting: Internal Medicine

## 2020-01-10 ENCOUNTER — Other Ambulatory Visit (INDEPENDENT_AMBULATORY_CARE_PROVIDER_SITE_OTHER): Payer: Self-pay | Admitting: Internal Medicine

## 2020-01-12 ENCOUNTER — Other Ambulatory Visit (INDEPENDENT_AMBULATORY_CARE_PROVIDER_SITE_OTHER): Payer: Self-pay | Admitting: Internal Medicine

## 2020-01-24 ENCOUNTER — Other Ambulatory Visit (INDEPENDENT_AMBULATORY_CARE_PROVIDER_SITE_OTHER): Payer: Self-pay | Admitting: Nurse Practitioner

## 2020-01-24 DIAGNOSIS — E119 Type 2 diabetes mellitus without complications: Secondary | ICD-10-CM

## 2020-01-24 MED ORDER — LANCET DEVICE MISC: 5 refills | Status: DC

## 2020-01-27 ENCOUNTER — Other Ambulatory Visit (INDEPENDENT_AMBULATORY_CARE_PROVIDER_SITE_OTHER): Payer: Self-pay | Admitting: Nurse Practitioner

## 2020-01-27 ENCOUNTER — Other Ambulatory Visit (INDEPENDENT_AMBULATORY_CARE_PROVIDER_SITE_OTHER): Payer: Self-pay | Admitting: Internal Medicine

## 2020-01-27 DIAGNOSIS — E119 Type 2 diabetes mellitus without complications: Secondary | ICD-10-CM

## 2020-01-27 MED ORDER — ACCU-CHEK SOFTCLIX LANCETS MISC: 0 refills | Status: DC

## 2020-01-27 NOTE — Telephone Encounter (Signed)
Please refill patient's diabetes supplies. Accucheck softclicks lancets. I have tried to fill this for the patient multiple times, but according to pharmacy "insurance won't process" the order when I send it under my name. Please send to [facility].

## 2020-02-01 ENCOUNTER — Other Ambulatory Visit (INDEPENDENT_AMBULATORY_CARE_PROVIDER_SITE_OTHER): Payer: Self-pay

## 2020-02-01 DIAGNOSIS — E119 Type 2 diabetes mellitus without complications: Secondary | ICD-10-CM

## 2020-02-01 MED ORDER — ACCU-CHEK SOFTCLIX LANCETS MISC: 0 refills | Status: DC

## 2020-02-21 ENCOUNTER — Ambulatory Visit: Payer: Medicare Other | Attending: Family

## 2020-02-21 ENCOUNTER — Other Ambulatory Visit: Payer: Self-pay

## 2020-02-21 ENCOUNTER — Ambulatory Visit (INDEPENDENT_AMBULATORY_CARE_PROVIDER_SITE_OTHER): Payer: Medicare Other | Admitting: Internal Medicine

## 2020-02-21 ENCOUNTER — Encounter (INDEPENDENT_AMBULATORY_CARE_PROVIDER_SITE_OTHER): Payer: Self-pay | Admitting: Internal Medicine

## 2020-02-21 VITALS — BP 128/72 | HR 80 | Temp 97.6°F | Resp 18 | Ht 69.0 in | Wt 344.2 lb

## 2020-02-21 DIAGNOSIS — R0600 Dyspnea, unspecified: Secondary | ICD-10-CM | POA: Diagnosis not present

## 2020-02-21 DIAGNOSIS — R059 Cough, unspecified: Secondary | ICD-10-CM

## 2020-02-21 DIAGNOSIS — Z20822 Contact with and (suspected) exposure to covid-19: Secondary | ICD-10-CM

## 2020-02-21 DIAGNOSIS — R05 Cough: Secondary | ICD-10-CM | POA: Diagnosis not present

## 2020-02-21 NOTE — Progress Notes (Signed)
Metrics: Intervention Frequency ACO  Documented Smoking Status Yearly  Screened one or more times in 24 months  Cessation Counseling or  Active cessation medication Past 24 months  Past 24 months   Guideline developer: UpToDate (See UpToDate for funding source) Date Released: 2014       Wellness Office Visit  Subjective:  Patient ID: Erin Sexton, female    DOB: 1971-04-06  Age: 49 y.o. MRN: [id number]  CC: This lady came in for an follow-up appointment regarding her chronic medical conditions but as soon as I walked into the exam room, she started telling me that she was short of breath and had been coughing. HPI She had been asked the questions for COVID-19 in the waiting room and she did not let my receptionist know about the symptoms.  She did have COVID-19 disease just over 3 months ago.  Past Medical History:  Diagnosis Date  . Abnormal CT scan    a. 02/2014: evaluated for dyspnea and initially placed on Xarelto for PE because radiologist could not exclude small peripheral pulmonary emboli because of limited contrast. However, after admission for ++vaginal bleeding several days later, it was felt she did NOT have PE - underwent further testing with neg VQ 03/06/14, neg LE duplex.  . Anemia   . Anxiety   . Asthma   . Blood transfusion without reported diagnosis   . Bone spur of right foot   . CHF (congestive heart failure) ([place])   . COPD (chronic obstructive pulmonary disease) ([place])   . Depression   . Diabetes mellitus without complication ([place])    a. Dx 02/2014.  [name] History of echocardiogram 2015   normal EF, Grade II diastolic dysfunction  . HOCM (hypertrophic obstructive cardiomyopathy) ([place])    a. Dx 66063.  [name] Hypertension   . Morbid obesity ([place])   . NSVT (nonsustained ventricular tachycardia) ([place])    a. Noted on tele during 02/2014 adm for symptomatic anemia.  [name] SVT (supraventricular tachycardia) ([place])    a. Noted on tele during 02/2014 adm for symptomatic anemia.   . Vaginal bleeding       Family History  Problem Relation Age of Onset  . Diabetes Mother   . Arthritis Mother   . Other Mother        WPW  . Mental illness Mother        Depression  . Heart disease Father        History of coronary bypass grafting at age 93  . Mental illness Sister   . Bipolar disorder Sister   . Heart disease Sister        possibly WPW  . Uterine cancer Maternal Grandmother   . Mental illness Maternal Grandmother   . Heart disease Maternal Grandfather   . CAD Neg Hx        No family history of EARLY CAD    Social History   Social History Narrative   Divorced for 3 years,married for 5 years.On disability since 2009 secondary to mental illness.   Social History   Tobacco Use  . Smoking status: Never Smoker  . Smokeless tobacco: Never Used  Substance Use Topics  . Alcohol use: No    Current Meds  Medication Sig  . ACCU-CHEK GUIDE test strip USE 1 STRIP TO CHECK GLUCOSE ONCE DAILY  . Accu-Chek Softclix Lancets lancets USE 1 LANCET TO CHECK GLUCOSE ONCE DAILY  . albuterol (PROAIR HFA) 108 (90 Base) MCG/ACT inhaler Inhale 2 puffs into the  lungs as needed (wheezing).   [name] albuterol (PROVENTIL) (5 MG/ML) 0.5% nebulizer solution Take 0.5 mLs (2.5 mg total) by nebulization every 6 (six) hours as needed for wheezing or shortness of breath.  [name] atorvastatin (LIPITOR) 80 MG tablet Take 80 mg by mouth daily.   . blood glucose meter kit and supplies KIT Dispense based on patient and insurance preference. Use once a day. Diagnosis code:E11.9  . buPROPion (WELLBUTRIN XL) 300 MG 24 hr tablet Take 1 tablet by mouth once daily  . Cholecalciferol (VITAMIN D PO) Take 15,000 Units by mouth daily.   [name] estradiol (ESTRACE) 2 MG tablet Take 1 tablet (2 mg total) by mouth at bedtime.  [name] glipiZIDE (GLUCOTROL) 5 MG tablet Take 1 tablet (5 mg total) by mouth 2 (two) times daily before a meal.  . Lancet Device MISC Use to check your blood sugar daily  . lisinopril (ZESTRIL)  2.5 MG tablet Take 1 tablet by mouth once daily  . megestrol (MEGACE) 40 MG tablet 1 tablet daily  . metFORMIN (GLUCOPHAGE) 500 MG tablet Take 1 tablet (500 mg total) by mouth 2 (two) times daily with a meal.  . metoprolol tartrate (LOPRESSOR) 50 MG tablet Take 1 tablet by mouth twice daily  . NP THYROID 120 MG tablet Take 1 tablet by mouth once daily  . NP THYROID 30 MG tablet Take 1 tablet by mouth once daily  . potassium chloride SA (K-DUR,KLOR-CON) 20 MEQ tablet Take 20 mEq by mouth 2 (two) times daily.   [name] PREBIOTIC PRODUCT PO Take 2 capsules by mouth daily.  . SYMBICORT 160-4.5 MCG/ACT inhaler Inhale 2 puffs into the lungs daily.   [name] torsemide (DEMADEX) 20 MG tablet Take 1 tablet by mouth twice daily       Objective:   Today's Vitals: BP 128/72 (BP Location: Right Arm, Patient Position: Sitting, Cuff Size: Normal)   Pulse 80   Temp 97.6 F (36.4 C) (Temporal)   Resp 18   Ht 5' 9" (1.753 m)   Wt (!) 344 lb 3.2 oz (156.1 kg)   LMP 06/04/2014   SpO2 97% Comment: wearing mask  BMI 50.83 kg/m  Vitals with BMI 02/21/2020 12/29/2019 11/15/2019  Height 5' 9" 5' 9" 5' 9"  Weight 344 lbs 3 oz 346 lbs 3 oz 348 lbs  BMI 50.81 59.5 63.87  Systolic 564 - 332  Diastolic 72 - 88  Pulse 80 - 76  Some encounter information is confidential and restricted. Go to Review Flowsheets activity to see all data.     Physical Exam  I briefly examined her and her lung fields appear to be clear.     Assessment   1. Cough   2. Dyspnea, unspecified type       Tests ordered No orders of the defined types were placed in this encounter.    Plan: 1. I informed the patient that we could not carry on the visit for a longer period of time today and that she must go to get tested for COVID-19 again. 2. She will have to call to reschedule the appointment.   No orders of the defined types were placed in this encounter.   Doree Albee, MD

## 2020-02-22 LAB — NOVEL CORONAVIRUS, NAA: SARS-CoV-2, NAA: NOT DETECTED

## 2020-03-08 ENCOUNTER — Other Ambulatory Visit (INDEPENDENT_AMBULATORY_CARE_PROVIDER_SITE_OTHER): Payer: Self-pay

## 2020-03-08 MED ORDER — GLIPIZIDE 5 MG PO TABS: 5.0000 mg | ORAL_TABLET | Freq: Two times a day (BID) | ORAL | 3 refills | Status: DC

## 2020-03-10 ENCOUNTER — Other Ambulatory Visit: Payer: Self-pay

## 2020-03-10 ENCOUNTER — Ambulatory Visit
Admission: EM | Admit: 2020-03-10 | Discharge: 2020-03-10 | Disposition: A | Discharge status: Home / Self Care | Payer: Medicare Other | Attending: Emergency Medicine | Admitting: Emergency Medicine

## 2020-03-10 ENCOUNTER — Ambulatory Visit (INDEPENDENT_AMBULATORY_CARE_PROVIDER_SITE_OTHER): Payer: Medicare Other

## 2020-03-10 DIAGNOSIS — R0600 Dyspnea, unspecified: Secondary | ICD-10-CM | POA: Diagnosis not present

## 2020-03-10 DIAGNOSIS — J029 Acute pharyngitis, unspecified: Secondary | ICD-10-CM | POA: Diagnosis not present

## 2020-03-10 DIAGNOSIS — R05 Cough: Secondary | ICD-10-CM | POA: Diagnosis not present

## 2020-03-10 DIAGNOSIS — Z20822 Contact with and (suspected) exposure to covid-19: Secondary | ICD-10-CM

## 2020-03-10 DIAGNOSIS — R059 Cough, unspecified: Secondary | ICD-10-CM

## 2020-03-10 LAB — POCT RAPID STREP A (OFFICE): Rapid Strep A Screen: NEGATIVE

## 2020-03-10 MED ORDER — PREDNISONE 20 MG PO TABS: 40.0000 mg | ORAL_TABLET | Freq: Every day | ORAL | 0 refills | Status: DC

## 2020-03-10 MED ORDER — AEROCHAMBER PLUS FLO-VU MEDIUM MISC: 1.0000 | Freq: Once | 0 refills | Status: AC

## 2020-03-10 MED ORDER — CETIRIZINE HCL 10 MG PO TABS: 10.0000 mg | ORAL_TABLET | Freq: Every day | ORAL | 0 refills | Status: DC

## 2020-03-10 MED ORDER — BENZONATATE 100 MG PO CAPS: 100.0000 mg | ORAL_CAPSULE | Freq: Three times a day (TID) | ORAL | 0 refills | Status: DC

## 2020-03-10 NOTE — ED Triage Notes (Signed)
Pt presents with complaints of cough, congestion and sore throat. Reports these symptoms started x 3 days. Patient tested negative on 3/15 for COVID but was not having symptoms then.

## 2020-03-10 NOTE — Discharge Instructions (Addendum)

## 2020-03-10 NOTE — ED Provider Notes (Signed)
[facility]    CSN: [id number] Arrival date & time: 03/10/20  8416      History   Chief Complaint Chief Complaint  Patient presents with  . Cough    HPI Erin Sexton is a 49 y.o. female with history of CHF, COPD, asthma, hypertension, obesity presenting for 3-day course of cough, nasal congestion and sore throat.  States that she had a mild cough when she went to see her PCP on 3/15.  States she was not seen there because he was concerned for Covid.  Underwent Covid testing: Negative.  Patient states over the last 3 days she has felt "much worse ".  States cough makes it harder to breathe.  Patient endorses dyspnea with exertion, feels it is slightly worse from baseline.  Denies chest pain, palpitations, dizziness or nausea.  Patient denies known sick contacts.  Has been using her inhaler at home with relief of dyspnea.  Patient does endorse history of CHF, states this feels similar, though not as bad.  Denies lower extremity edema, change in urination.   Past Medical History:  Diagnosis Date  . Abnormal CT scan    a. 02/2014: evaluated for dyspnea and initially placed on Xarelto for PE because radiologist could not exclude small peripheral pulmonary emboli because of limited contrast. However, after admission for ++vaginal bleeding several days later, it was felt she did NOT have PE - underwent further testing with neg VQ 03/06/14, neg LE duplex.  . Anemia   . Anxiety   . Asthma   . Blood transfusion without reported diagnosis   . Bone spur of right foot   . CHF (congestive heart failure) ([place])   . COPD (chronic obstructive pulmonary disease) ([place])   . Depression   . Diabetes mellitus without complication ([place])    a. Dx 02/2014.  [name] History of echocardiogram 2015   normal EF, Grade II diastolic dysfunction  . HOCM (hypertrophic obstructive cardiomyopathy) ([place])    a. Dx [address].  [name] Hypertension   . Morbid obesity ([place])   . NSVT (nonsustained ventricular tachycardia)  ([place])    a. Noted on tele during 02/2014 adm for symptomatic anemia.  [name] SVT (supraventricular tachycardia) ([place])    a. Noted on tele during 02/2014 adm for symptomatic anemia.  . Vaginal bleeding     Patient Active Problem List   Diagnosis Date Noted  . Special screening for malignant neoplasms, colon 07/07/2019  . Facial droop 06/20/2016  . TIA (transient ischemic attack) 06/20/2016  . Chronic diastolic CHF (congestive heart failure) ([place]) 06/20/2016  . Complex endometrial hyperplasia without atypia 06/08/2014  . Menorrhagia 06/01/2014  . Physical deconditioning 05/25/2014  . Hypokalemia 05/20/2014  . Acute on chronic diastolic congestive heart failure ([place]) 05/18/2014  . Diabetes mellitus type 2, noninsulin dependent ([place])   . OSA (obstructive sleep apnea) 03/21/2014  . Acute blood loss anemia 03/06/2014  . HOCM (hypertrophic obstructive cardiomyopathy) ([place]) 03/06/2014  . Morbid obesity ([place]) 03/02/2014  . Essential hypertension, benign 03/02/2014  . Asthma, chronic 03/02/2014    Past Surgical History:  Procedure Laterality Date  . COLONOSCOPY N/A 09/30/2019   Procedure: COLONOSCOPY;  Surgeon: Rogene Houston, MD;  Location: [facility];  Service: Endoscopy;  Laterality: N/A;  730  . DILITATION & CURRETTAGE/HYSTROSCOPY WITH NOVASURE ABLATION N/A 08/07/2016   Procedure: DILATATION & CURETTAGE/HYSTEROSCOPY WITH ATTPEMPTED NOVASURE ABLATION AND INSERTION OF IUD;  Surgeon: Florian Buff, MD;  Location: [facility]S;  Service: Gynecology;  Laterality: N/A;  OB History    Gravida      Para      Term      Preterm      AB      Living  0     SAB      TAB      Ectopic      Multiple      Live Births               Home Medications    Prior to Admission medications   Medication Sig Start Date End Date Taking? Authorizing Provider  ACCU-CHEK GUIDE test strip USE 1 STRIP TO CHECK GLUCOSE ONCE DAILY 01/04/20   Hurshel Party C, MD  Accu-Chek Softclix Lancets  lancets USE 1 LANCET TO CHECK GLUCOSE ONCE DAILY 02/01/20   Hurshel Party C, MD  albuterol (PROAIR HFA) 108 (90 Base) MCG/ACT inhaler Inhale 2 puffs into the lungs as needed (wheezing).     [provider]  albuterol (PROVENTIL) (5 MG/ML) 0.5% nebulizer solution Take 0.5 mLs (2.5 mg total) by nebulization every 6 (six) hours as needed for wheezing or shortness of breath. 02/22/17   Julianne Rice, MD  atorvastatin (LIPITOR) 80 MG tablet Take 80 mg by mouth daily.  01/25/16   [provider]  benzonatate (TESSALON) 100 MG capsule Take 1 capsule (100 mg total) by mouth every 8 (eight) hours. 03/10/20   Hall-Potvin, [place], PA-C  blood glucose meter kit and supplies KIT Dispense based on patient and insurance preference. Use once a day. Diagnosis code:E11.9 10/26/19   Hurshel Party C, MD  buPROPion (WELLBUTRIN XL) 300 MG 24 hr tablet Take 1 tablet by mouth once daily 11/08/19   Hurshel Party C, MD  cetirizine (ZYRTEC ALLERGY) 10 MG tablet Take 1 tablet (10 mg total) by mouth daily. 03/10/20   Hall-Potvin, [place], PA-C  Cholecalciferol (VITAMIN D PO) Take 15,000 Units by mouth daily.     [provider]  estradiol (ESTRACE) 2 MG tablet Take 1 tablet (2 mg total) by mouth at bedtime. 11/16/19   Florian Buff, MD  glipiZIDE (GLUCOTROL) 5 MG tablet Take 1 tablet (5 mg total) by mouth 2 (two) times daily before a meal. 03/08/20   Doree Albee, MD  Lancet Device MISC Use to check your blood sugar daily 01/24/20   Ailene Ards, NP  lisinopril (ZESTRIL) 2.5 MG tablet Take 1 tablet by mouth once daily 01/10/20   Doree Albee, MD  megestrol (MEGACE) 40 MG tablet 1 tablet daily 11/16/19   Florian Buff, MD  metFORMIN (GLUCOPHAGE) 500 MG tablet Take 1 tablet (500 mg total) by mouth 2 (two) times daily with a meal. 05/27/14   Angiulli, Lavon Paganini, PA-C  metoprolol tartrate (LOPRESSOR) 50 MG tablet Take 1 tablet by mouth twice daily 01/10/20   Doree Albee, MD  NP THYROID 120  MG tablet Take 1 tablet by mouth once daily 01/12/20   Doree Albee, MD  NP THYROID 30 MG tablet Take 1 tablet by mouth once daily 01/12/20   Anastasio Champion, Nimish C, MD  potassium chloride SA (K-DUR,KLOR-CON) 20 MEQ tablet Take 20 mEq by mouth 2 (two) times daily.     [provider]  PREBIOTIC PRODUCT PO Take 2 capsules by mouth daily.    [provider]  predniSONE (DELTASONE) 20 MG tablet Take 2 tablets (40 mg total) by mouth daily. 03/10/20   Hall-Potvin, [place], PA-C  Spacer/Aero-Holding Chambers (AEROCHAMBER PLUS FLO-VU MEDIUM) MISC  1 each by Other route once for 1 dose. 03/10/20 03/10/20  Hall-Potvin, [place], PA-C  SYMBICORT 160-4.5 MCG/ACT inhaler Inhale 2 puffs into the lungs daily.  03/10/19   [provider]  torsemide (DEMADEX) 20 MG tablet Take 1 tablet by mouth twice daily 12/25/19   Hurshel Party C, MD  buPROPion (WELLBUTRIN XL) 300 MG 24 hr tablet Take 1 tablet (300 mg total) by mouth daily. 04/14/19   Norman Clay, MD  lisinopril (ZESTRIL) 2.5 MG tablet Take 1 tablet by mouth once daily 10/12/19   Hurshel Party C, MD  metoprolol tartrate (LOPRESSOR) 50 MG tablet Take 1 tablet by mouth twice daily 10/12/19   Hurshel Party C, MD  torsemide (DEMADEX) 20 MG tablet Take 20 mg by mouth 2 (two) times daily.    [provider]    Family History Family History  Problem Relation Age of Onset  . Diabetes Mother   . Arthritis Mother   . Other Mother        WPW  . Mental illness Mother        Depression  . Heart disease Father        History of coronary bypass grafting at age 62  . Mental illness Sister   . Bipolar disorder Sister   . Heart disease Sister        possibly WPW  . Uterine cancer Maternal Grandmother   . Mental illness Maternal Grandmother   . Heart disease Maternal Grandfather   . CAD Neg Hx        No family history of EARLY CAD    Social History Social History   Tobacco Use  . Smoking status: Never Smoker  . Smokeless tobacco:  Never Used  Substance Use Topics  . Alcohol use: No  . Drug use: No     Allergies   Patient has no known allergies.   Review of Systems As per HPI   Physical Exam Triage Vital Signs ED Triage Vitals  Enc Vitals Group     BP      Pulse      Resp      Temp      Temp src      SpO2      Weight      Height      Head Circumference      Peak Flow      Pain Score      Pain Loc      Pain Edu?      Excl. in [place]?    No data found.  Updated Vital Signs BP (!) 144/82 (BP Location: Right Arm)   Pulse 75   Temp 98.4 F (36.9 C) (Oral)   Resp 17   LMP 06/04/2014   SpO2 96%   Visual Acuity Right Eye Distance:   Left Eye Distance:   Bilateral Distance:    Right Eye Near:   Left Eye Near:    Bilateral Near:     Physical Exam Constitutional:      General: She is not in acute distress.    Appearance: She is obese. She is not ill-appearing or diaphoretic.  HENT:     Head: Normocephalic and atraumatic.     Mouth/Throat:     Mouth: Mucous membranes are moist.     Pharynx: Oropharynx is clear. No oropharyngeal exudate or posterior oropharyngeal erythema.  Eyes:     General: No scleral icterus.    Conjunctiva/sclera: Conjunctivae normal.  Pupils: Pupils are equal, round, and reactive to light.  Neck:     Comments: Trachea midline, negative JVD Cardiovascular:     Rate and Rhythm: Normal rate and regular rhythm.     Heart sounds: No murmur. No gallop.   Pulmonary:     Effort: Pulmonary effort is normal. No respiratory distress.     Breath sounds: Wheezing and rales present. No rhonchi.     Comments: Adventitia in bilateral upper lobes, mild Musculoskeletal:     Cervical back: Neck supple. No tenderness.     Right lower leg: Edema present.     Left lower leg: Edema present.     Comments: 1+ pitting  Lymphadenopathy:     Cervical: No cervical adenopathy.  Skin:    Capillary Refill: Capillary refill takes less than 2 seconds.     Coloration: Skin is not  jaundiced or pale.     Findings: No rash.  Neurological:     General: No focal deficit present.     Mental Status: She is alert and oriented to person, place, and time.      UC Treatments / Results  Labs (all labs ordered are listed, but only abnormal results are displayed) Labs Reviewed  NOVEL CORONAVIRUS, NAA  CULTURE, GROUP A STREP [facility])  POCT RAPID STREP A (OFFICE)    EKG   Radiology DG Chest 2 View  Result Date: 03/10/2020 CLINICAL DATA:  Dyspnea, cough EXAM: CHEST - 2 VIEW COMPARISON:  01/31/2019 FINDINGS: The heart size and mediastinal contours are within normal limits. No focal airspace consolidation, pleural effusion, or pneumothorax. The visualized skeletal structures are unremarkable. IMPRESSION: No active cardiopulmonary disease. Electronically Signed   By: Davina Poke D.O.   On: 03/10/2020 09:51    Procedures Procedures (including critical care time)  Medications Ordered in UC Medications - No data to display  Initial Impression / Assessment and Plan / UC Course  I have reviewed the triage vital signs and the nursing notes.  Pertinent labs & imaging results that were available during my care of the patient were reviewed by me and considered in my medical decision making (see chart for details).     Patient afebrile, nontoxic in office today.  Rapid strep negative, culture pending.  Covid PCR pending.  Given patient's history of CHF with mild lower extremity edema and dyspnea, CXR was obtained.  This is reviewed by me and radiology, compared to previous from 01/31/2019: No focal airspace consolidation, pleural effusion, pneumothorax.  No active cardiopulmonary disease.  Reviewed findings with patient verbalized understanding.  Will treat for acute bronchitis, given increased sputum production as outlined below.  Return precautions discussed, patient verbalized understanding and is agreeable to plan. Final Clinical Impressions(s) / UC Diagnoses   Final  diagnoses:  Suspected COVID-19 virus infection  Cough  Sore throat     Discharge Instructions     Tessalon for cough. Start flonase, atrovent nasal spray for nasal congestion/drainage. You can use over the counter nasal saline rinse such as neti pot for nasal congestion. Keep hydrated, your urine should be clear to pale yellow in color. Tylenol/motrin for fever and pain. Monitor for any worsening of symptoms, chest pain, shortness of breath, wheezing, swelling of the throat, go to the emergency department for further evaluation needed.     ED Prescriptions    Medication Sig Dispense Auth. Provider   Spacer/Aero-Holding Chambers (AEROCHAMBER PLUS FLO-VU MEDIUM) MISC 1 each by Other route once for 1 dose. 1 each Hall-Potvin, [place],  PA-C   benzonatate (TESSALON) 100 MG capsule Take 1 capsule (100 mg total) by mouth every 8 (eight) hours. 21 capsule Hall-Potvin, [place], PA-C   cetirizine (ZYRTEC ALLERGY) 10 MG tablet Take 1 tablet (10 mg total) by mouth daily. 30 tablet Hall-Potvin, [place], PA-C   predniSONE (DELTASONE) 20 MG tablet Take 2 tablets (40 mg total) by mouth daily. 7 tablet Hall-Potvin, [place], PA-C     PDMP not reviewed this encounter.   Hall-Potvin, [place], [place] 03/10/20 1209

## 2020-03-11 ENCOUNTER — Other Ambulatory Visit (INDEPENDENT_AMBULATORY_CARE_PROVIDER_SITE_OTHER): Payer: Self-pay | Admitting: Internal Medicine

## 2020-03-12 LAB — SARS-COV-2, NAA 2 DAY TAT

## 2020-03-12 LAB — NOVEL CORONAVIRUS, NAA: SARS-CoV-2, NAA: NOT DETECTED

## 2020-03-13 LAB — CULTURE, GROUP A STREP (THRC)

## 2020-03-16 ENCOUNTER — Ambulatory Visit
Admission: EM | Admit: 2020-03-16 | Discharge: 2020-03-16 | Disposition: A | Discharge status: Home / Self Care | Payer: Medicare Other | Attending: Emergency Medicine | Admitting: Emergency Medicine

## 2020-03-16 ENCOUNTER — Other Ambulatory Visit: Payer: Self-pay

## 2020-03-16 ENCOUNTER — Other Ambulatory Visit (INDEPENDENT_AMBULATORY_CARE_PROVIDER_SITE_OTHER): Payer: Self-pay | Admitting: Internal Medicine

## 2020-03-16 DIAGNOSIS — L02219 Cutaneous abscess of trunk, unspecified: Secondary | ICD-10-CM | POA: Diagnosis not present

## 2020-03-16 DIAGNOSIS — L03319 Cellulitis of trunk, unspecified: Secondary | ICD-10-CM | POA: Diagnosis not present

## 2020-03-16 MED ORDER — DOXYCYCLINE HYCLATE 100 MG PO CAPS: 100.0000 mg | ORAL_CAPSULE | Freq: Two times a day (BID) | ORAL | 0 refills | Status: DC

## 2020-03-16 NOTE — Discharge Instructions (Signed)
Apply warm compresses 3-4x daily for 10-15 minutes Wash site daily with warm water and mild soap Keep covered to avoid friction Take antibiotic as prescribed and to completion Follow up here or with PCP if symptoms persists Return or go to the ED if you have any new or worsening symptoms increased redness, swelling, pain, nausea, vomiting, fever, chills, etc...  

## 2020-03-16 NOTE — ED Triage Notes (Signed)
Pt presents with complaints of abscess to the right axilla. Denies fevers.

## 2020-03-16 NOTE — ED Provider Notes (Signed)
[place]   [id number] 03/16/20 Arrival Time: 0539   [id number]  SUBJECTIVE:  Erin Sexton is a 49 y.o. female who presents with a possible abscess of her RT axilla. Onset abrupt, this morning.  Denies precipitating event or trauma.  Has tried warm compresses with minimal relief.  Tender to the touch.  Denies similar symptoms in the past.  Reports other red spots on abdomen as well.  Complains of pain, redness, and swelling.  Denies fever, chills, nausea, vomiting.    ROS: As per HPI.  All other pertinent ROS negative.     Past Medical History:  Diagnosis Date  . Abnormal CT scan    a. 02/2014: evaluated for dyspnea and initially placed on Xarelto for PE because radiologist could not exclude small peripheral pulmonary emboli because of limited contrast. However, after admission for ++vaginal bleeding several days later, it was felt she did NOT have PE - underwent further testing with neg VQ 03/06/14, neg LE duplex.  . Anemia   . Anxiety   . Asthma   . Blood transfusion without reported diagnosis   . Bone spur of right foot   . CHF (congestive heart failure) ([place])   . COPD (chronic obstructive pulmonary disease) ([place])   . Depression   . Diabetes mellitus without complication ([place])    a. Dx 02/2014.  [name] History of echocardiogram 2015   normal EF, Grade II diastolic dysfunction  . HOCM (hypertrophic obstructive cardiomyopathy) ([place])    a. Dx 90240.  [name] Hypertension   . Morbid obesity ([place])   . NSVT (nonsustained ventricular tachycardia) ([place])    a. Noted on tele during 02/2014 adm for symptomatic anemia.  [name] SVT (supraventricular tachycardia) ([place])    a. Noted on tele during 02/2014 adm for symptomatic anemia.  . Vaginal bleeding    Past Surgical History:  Procedure Laterality Date  . COLONOSCOPY N/A 09/30/2019   Procedure: COLONOSCOPY;  Surgeon: Rogene Houston, MD;  Location: [facility];  Service: Endoscopy;  Laterality: N/A;  730  . DILITATION &  CURRETTAGE/HYSTROSCOPY WITH NOVASURE ABLATION N/A 08/07/2016   Procedure: DILATATION & CURETTAGE/HYSTEROSCOPY WITH ATTPEMPTED NOVASURE ABLATION AND INSERTION OF IUD;  Surgeon: Florian Buff, MD;  Location: [facility] ORS;  Service: Gynecology;  Laterality: N/A;   No Known Allergies No current facility-administered medications on file prior to encounter.   Current Outpatient Medications on File Prior to Encounter  Medication Sig Dispense Refill  . ACCU-CHEK GUIDE test strip USE 1 STRIP TO CHECK GLUCOSE ONCE DAILY 100 each 0  . Accu-Chek Softclix Lancets lancets USE 1 LANCET TO CHECK GLUCOSE ONCE DAILY 100 each 0  . albuterol (PROAIR HFA) 108 (90 Base) MCG/ACT inhaler Inhale 2 puffs into the lungs as needed (wheezing).     [name] albuterol (PROVENTIL) (5 MG/ML) 0.5% nebulizer solution Take 0.5 mLs (2.5 mg total) by nebulization every 6 (six) hours as needed for wheezing or shortness of breath. 20 mL 0  . atorvastatin (LIPITOR) 80 MG tablet Take 80 mg by mouth daily.   3  . benzonatate (TESSALON) 100 MG capsule Take 1 capsule (100 mg total) by mouth every 8 (eight) hours. 21 capsule 0  . blood glucose meter kit and supplies KIT Dispense based on patient and insurance preference. Use once a day. Diagnosis code:E11.9 1 each 0  . buPROPion (WELLBUTRIN XL) 300 MG 24 hr tablet Take 1 tablet by mouth once daily 90 tablet 0  . cetirizine (ZYRTEC ALLERGY) 10 MG tablet Take  1 tablet (10 mg total) by mouth daily. 30 tablet 0  . Cholecalciferol (VITAMIN D PO) Take 15,000 Units by mouth daily.     [name] estradiol (ESTRACE) 2 MG tablet Take 1 tablet (2 mg total) by mouth at bedtime. 30 tablet 11  . glipiZIDE (GLUCOTROL) 5 MG tablet TAKE 1 TABLET BY MOUTH TWICE DAILY BEFORE A MEAL 180 tablet 0  . Lancet Device MISC Use to check your blood sugar daily 100 each 5  . lisinopril (ZESTRIL) 2.5 MG tablet Take 1 tablet by mouth once daily 90 tablet 0  . megestrol (MEGACE) 40 MG tablet 1 tablet daily 30 tablet 11  . metFORMIN  (GLUCOPHAGE) 500 MG tablet Take 1 tablet (500 mg total) by mouth 2 (two) times daily with a meal. 60 tablet 0  . metoprolol tartrate (LOPRESSOR) 50 MG tablet Take 1 tablet by mouth twice daily 180 tablet 0  . NP THYROID 120 MG tablet Take 1 tablet by mouth once daily 90 tablet 0  . NP THYROID 30 MG tablet Take 1 tablet by mouth once daily 90 tablet 0  . potassium chloride SA (K-DUR,KLOR-CON) 20 MEQ tablet Take 20 mEq by mouth 2 (two) times daily.     [name] PREBIOTIC PRODUCT PO Take 2 capsules by mouth daily.    . predniSONE (DELTASONE) 20 MG tablet Take 2 tablets (40 mg total) by mouth daily. 7 tablet 0  . SYMBICORT 160-4.5 MCG/ACT inhaler Inhale 2 puffs into the lungs daily.     [name] torsemide (DEMADEX) 20 MG tablet Take 1 tablet by mouth twice daily 180 tablet 0  . [DISCONTINUED] buPROPion (WELLBUTRIN XL) 300 MG 24 hr tablet Take 1 tablet (300 mg total) by mouth daily. 30 tablet 0  . [DISCONTINUED] glipiZIDE (GLUCOTROL) 5 MG tablet Take 1 tablet (5 mg total) by mouth 2 (two) times daily before a meal. 60 tablet 3  . [DISCONTINUED] lisinopril (ZESTRIL) 2.5 MG tablet Take 1 tablet by mouth once daily 90 tablet 0  . [DISCONTINUED] metoprolol tartrate (LOPRESSOR) 50 MG tablet Take 1 tablet by mouth twice daily 180 tablet 0  . [DISCONTINUED] torsemide (DEMADEX) 20 MG tablet Take 20 mg by mouth 2 (two) times daily.     Social History   Socioeconomic History  . Marital status: Divorced    Spouse name: Not on file  . Number of children: 0  . Years of education: Not on file  . Highest education level: Not on file  Occupational History  . Not on file  Tobacco Use  . Smoking status: Never Smoker  . Smokeless tobacco: Never Used  Substance and Sexual Activity  . Alcohol use: No  . Drug use: No  . Sexual activity: Not Currently    Birth control/protection: I.U.D.  Other Topics Concern  . Not on file  Social History Narrative   Divorced for 3 years,married for 5 years.On disability since 2009  secondary to mental illness.   Social Determinants of Health   Financial Resource Strain:   . Difficulty of Paying Living Expenses:   Food Insecurity:   . Worried About Charity fundraiser in the Last Year:   . Arboriculturist in the Last Year:   Transportation Needs:   . Film/video editor (Medical):   [name] Lack of Transportation (Non-Medical):   Physical Activity:   . Days of Exercise per Week:   . Minutes of Exercise per Session:   Stress:   . Feeling of Stress :  Social Connections:   . Frequency of Communication with Friends and Family:   . Frequency of Social Gatherings with Friends and Family:   . Attends Religious Services:   . Active Member of Clubs or Organizations:   . Attends Archivist Meetings:   [name] Marital Status:   Intimate Partner Violence:   . Fear of Current or Ex-Partner:   . Emotionally Abused:   [name] Physically Abused:   . Sexually Abused:    Family History  Problem Relation Age of Onset  . Diabetes Mother   . Arthritis Mother   . Other Mother        WPW  . Mental illness Mother        Depression  . Heart disease Father        History of coronary bypass grafting at age 51  . Mental illness Sister   . Bipolar disorder Sister   . Heart disease Sister        possibly WPW  . Uterine cancer Maternal Grandmother   . Mental illness Maternal Grandmother   . Heart disease Maternal Grandfather   . CAD Neg Hx        No family history of EARLY CAD    OBJECTIVE:  Vitals:   03/16/20 1247  BP: (!) 143/75  Pulse: 84  Resp: 20  Temp: 98.4 F (36.9 C)  TempSrc: Oral  SpO2: 93%     General appearance: alert; no distress HENT: NCAT; EOMI grossly Lungs: Normal respiratory effort Skin: apx 3 cm area of induration of her RT axilla; tender to touch; no active drainage, 2-3 other erythematous papules on abdomen/ chest, < 1 cm in diameter, no obvious bleeding or discharge, TTP Psychological: alert and cooperative; normal mood and  affect   ASSESSMENT & PLAN:  1. Cellulitis and abscess of trunk     Meds ordered this encounter  Medications  . doxycycline (VIBRAMYCIN) 100 MG capsule    Sig: Take 1 capsule (100 mg total) by mouth 2 (two) times daily.    Dispense:  20 capsule    Refill:  0    Order Specific Question:   Supervising Provider    Answer:   Raylene Everts [[id number]]   Apply warm compresses 3-4x daily for 10-15 minutes Wash site daily with warm water and mild soap Keep covered to avoid friction Take antibiotic as prescribed and to completion Follow up here or with PCP if symptoms persists Return or go to the ED if you have any new or worsening symptoms increased redness, swelling, pain, nausea, vomiting, fever, chills, etc...    Reviewed expectations re: course of current medical issues. Questions answered. Outlined signs and symptoms indicating need for more acute intervention. Patient verbalized understanding. After Visit Summary given.          Lestine Box, PA-C 03/16/20 1254

## 2020-03-18 ENCOUNTER — Emergency Department (HOSPITAL_COMMUNITY)
Admission: EM | Admit: 2020-03-18 | Discharge: 2020-03-18 | Disposition: A | Discharge status: Home / Self Care | Payer: Medicare Other | Attending: Emergency Medicine | Admitting: Emergency Medicine

## 2020-03-18 ENCOUNTER — Other Ambulatory Visit: Payer: Self-pay

## 2020-03-18 ENCOUNTER — Encounter (HOSPITAL_COMMUNITY): Payer: Self-pay | Admitting: Emergency Medicine

## 2020-03-18 DIAGNOSIS — J449 Chronic obstructive pulmonary disease, unspecified: Secondary | ICD-10-CM | POA: Diagnosis not present

## 2020-03-18 DIAGNOSIS — E119 Type 2 diabetes mellitus without complications: Secondary | ICD-10-CM | POA: Diagnosis not present

## 2020-03-18 DIAGNOSIS — I110 Hypertensive heart disease with heart failure: Secondary | ICD-10-CM | POA: Diagnosis not present

## 2020-03-18 DIAGNOSIS — L02412 Cutaneous abscess of left axilla: Secondary | ICD-10-CM | POA: Insufficient documentation

## 2020-03-18 DIAGNOSIS — Z8673 Personal history of transient ischemic attack (TIA), and cerebral infarction without residual deficits: Secondary | ICD-10-CM | POA: Insufficient documentation

## 2020-03-18 DIAGNOSIS — Z7984 Long term (current) use of oral hypoglycemic drugs: Secondary | ICD-10-CM | POA: Insufficient documentation

## 2020-03-18 DIAGNOSIS — I5032 Chronic diastolic (congestive) heart failure: Secondary | ICD-10-CM | POA: Insufficient documentation

## 2020-03-18 DIAGNOSIS — L02411 Cutaneous abscess of right axilla: Secondary | ICD-10-CM | POA: Diagnosis not present

## 2020-03-18 DIAGNOSIS — J45909 Unspecified asthma, uncomplicated: Secondary | ICD-10-CM | POA: Diagnosis not present

## 2020-03-18 DIAGNOSIS — L539 Erythematous condition, unspecified: Secondary | ICD-10-CM | POA: Diagnosis present

## 2020-03-18 DIAGNOSIS — Z79899 Other long term (current) drug therapy: Secondary | ICD-10-CM | POA: Insufficient documentation

## 2020-03-18 MED ORDER — LIDOCAINE-EPINEPHRINE (PF) 2 %-1:200000 IJ SOLN
10.0000 mL | Freq: Once | INTRAMUSCULAR | Status: AC
  Administered 2020-03-18: 10 mL
  Filled 2020-03-18: qty 10

## 2020-03-18 NOTE — ED Provider Notes (Signed)
[facility] EMERGENCY DEPARTMENT Provider Note   CSN: [id number] Arrival date & time: 03/18/20  1927     History Chief Complaint  Patient presents with  . Abscess    Erin Sexton is a 49 y.o. female.  Erin Sexton is a 49 y.o. female with a history of diabetes, CHF, hypertension, obesity, who presents to the emergency department for evaluation of right axillary abscess.  She reports this initially started 2 days ago when she noticed a small area of redness and pain in her right armpit.  She states she went to urgent care and they did not feel a drainable area at that time but started her on doxycycline which she has completed 3 doses of.  She states that despite antibiotics she has had significantly worsened pain and spreading redness.  She is also noted some small scabbed lesions that have popped up over her axilla and trunk.  History of one previous similar abscess several years ago, and history of MRSA infection.  No history of hidradenitis.  She has not had any fevers or chills.  No other aggravating or alleviating factors.        Past Medical History:  Diagnosis Date  . Abnormal CT scan    a. 02/2014: evaluated for dyspnea and initially placed on Xarelto for PE because radiologist could not exclude small peripheral pulmonary emboli because of limited contrast. However, after admission for ++vaginal bleeding several days later, it was felt she did NOT have PE - underwent further testing with neg VQ 03/06/14, neg LE duplex.  . Anemia   . Anxiety   . Asthma   . Blood transfusion without reported diagnosis   . Bone spur of right foot   . CHF (congestive heart failure) ([place])   . COPD (chronic obstructive pulmonary disease) ([place])   . Depression   . Diabetes mellitus without complication ([place])    a. Dx 02/2014.  [name] History of echocardiogram 2015   normal EF, Grade II diastolic dysfunction  . HOCM (hypertrophic obstructive cardiomyopathy) ([place])    a. Dx 62035.  [name]  Hypertension   . Morbid obesity ([place])   . NSVT (nonsustained ventricular tachycardia) ([place])    a. Noted on tele during 02/2014 adm for symptomatic anemia.  [name] SVT (supraventricular tachycardia) ([place])    a. Noted on tele during 02/2014 adm for symptomatic anemia.  . Vaginal bleeding     Patient Active Problem List   Diagnosis Date Noted  . Special screening for malignant neoplasms, colon 07/07/2019  . Facial droop 06/20/2016  . TIA (transient ischemic attack) 06/20/2016  . Chronic diastolic CHF (congestive heart failure) ([place]) 06/20/2016  . Complex endometrial hyperplasia without atypia 06/08/2014  . Menorrhagia 06/01/2014  . Physical deconditioning 05/25/2014  . Hypokalemia 05/20/2014  . Acute on chronic diastolic congestive heart failure ([place]) 05/18/2014  . Diabetes mellitus type 2, noninsulin dependent ([place])   . OSA (obstructive sleep apnea) 03/21/2014  . Acute blood loss anemia 03/06/2014  . HOCM (hypertrophic obstructive cardiomyopathy) ([place]) 03/06/2014  . Morbid obesity ([place]) 03/02/2014  . Essential hypertension, benign 03/02/2014  . Asthma, chronic 03/02/2014    Past Surgical History:  Procedure Laterality Date  . COLONOSCOPY N/A 09/30/2019   Procedure: COLONOSCOPY;  Surgeon: Rogene Houston, MD;  Location: [facility];  Service: Endoscopy;  Laterality: N/A;  730  . DILITATION & CURRETTAGE/HYSTROSCOPY WITH NOVASURE ABLATION N/A 08/07/2016   Procedure: DILATATION & CURETTAGE/HYSTEROSCOPY WITH ATTPEMPTED NOVASURE ABLATION AND INSERTION OF IUD;  Surgeon:  Florian Buff, MD;  Location: [facility] ORS;  Service: Gynecology;  Laterality: N/A;     OB History    Gravida      Para      Term      Preterm      AB      Living  0     SAB      TAB      Ectopic      Multiple      Live Births              Family History  Problem Relation Age of Onset  . Diabetes Mother   . Arthritis Mother   . Other Mother        WPW  . Mental illness Mother        Depression  .  Heart disease Father        History of coronary bypass grafting at age 21  . Mental illness Sister   . Bipolar disorder Sister   . Heart disease Sister        possibly WPW  . Uterine cancer Maternal Grandmother   . Mental illness Maternal Grandmother   . Heart disease Maternal Grandfather   . CAD Neg Hx        No family history of EARLY CAD    Social History   Tobacco Use  . Smoking status: Never Smoker  . Smokeless tobacco: Never Used  Substance Use Topics  . Alcohol use: No  . Drug use: No    Home Medications Prior to Admission medications   Medication Sig Start Date End Date Taking? Authorizing Provider  ACCU-CHEK GUIDE test strip USE 1 STRIP TO CHECK GLUCOSE ONCE DAILY 01/04/20   Hurshel Party C, MD  Accu-Chek Softclix Lancets lancets USE 1 LANCET TO CHECK GLUCOSE ONCE DAILY 02/01/20   Hurshel Party C, MD  albuterol (PROAIR HFA) 108 (90 Base) MCG/ACT inhaler Inhale 2 puffs into the lungs as needed (wheezing).     [provider]  albuterol (PROVENTIL) (5 MG/ML) 0.5% nebulizer solution Take 0.5 mLs (2.5 mg total) by nebulization every 6 (six) hours as needed for wheezing or shortness of breath. 02/22/17   Julianne Rice, MD  atorvastatin (LIPITOR) 80 MG tablet Take 80 mg by mouth daily.  01/25/16   [provider]  benzonatate (TESSALON) 100 MG capsule Take 1 capsule (100 mg total) by mouth every 8 (eight) hours. 03/10/20   Hall-Potvin, [place], PA-C  blood glucose meter kit and supplies KIT Dispense based on patient and insurance preference. Use once a day. Diagnosis code:E11.9 10/26/19   Hurshel Party C, MD  buPROPion (WELLBUTRIN XL) 300 MG 24 hr tablet Take 1 tablet by mouth once daily 11/08/19   Hurshel Party C, MD  cetirizine (ZYRTEC ALLERGY) 10 MG tablet Take 1 tablet (10 mg total) by mouth daily. 03/10/20   Hall-Potvin, [place], PA-C  Cholecalciferol (VITAMIN D PO) Take 15,000 Units by mouth daily.     [provider]  doxycycline  (VIBRAMYCIN) 100 MG capsule Take 1 capsule (100 mg total) by mouth 2 (two) times daily. 03/16/20   Wurst, [place], PA-C  estradiol (ESTRACE) 2 MG tablet Take 1 tablet (2 mg total) by mouth at bedtime. 11/16/19   Florian Buff, MD  glipiZIDE (GLUCOTROL) 5 MG tablet TAKE 1 TABLET BY MOUTH TWICE DAILY BEFORE A MEAL 03/11/20   Doree Albee, MD  Lancet Device MISC Use to check your blood sugar daily 01/24/20  Ailene Ards, NP  lisinopril (ZESTRIL) 2.5 MG tablet Take 1 tablet by mouth once daily 01/10/20   Hurshel Party C, MD  megestrol (MEGACE) 40 MG tablet 1 tablet daily 11/16/19   Florian Buff, MD  metFORMIN (GLUCOPHAGE) 500 MG tablet Take 1 tablet (500 mg total) by mouth 2 (two) times daily with a meal. 05/27/14   Angiulli, Lavon Paganini, PA-C  metoprolol tartrate (LOPRESSOR) 50 MG tablet Take 1 tablet by mouth twice daily 01/10/20   Doree Albee, MD  NP THYROID 120 MG tablet Take 1 tablet by mouth once daily 01/12/20   Doree Albee, MD  NP THYROID 30 MG tablet Take 1 tablet by mouth once daily 01/12/20   Anastasio Champion, Nimish C, MD  potassium chloride SA (K-DUR,KLOR-CON) 20 MEQ tablet Take 20 mEq by mouth 2 (two) times daily.     [provider]  PREBIOTIC PRODUCT PO Take 2 capsules by mouth daily.    [provider]  predniSONE (DELTASONE) 20 MG tablet Take 2 tablets (40 mg total) by mouth daily. 03/10/20   Hall-Potvin, [place], PA-C  SYMBICORT 160-4.5 MCG/ACT inhaler Inhale 2 puffs into the lungs daily.  03/10/19   [provider]  torsemide (DEMADEX) 20 MG tablet Take 1 tablet by mouth twice daily 03/16/20   Ailene Ards, NP  buPROPion (WELLBUTRIN XL) 300 MG 24 hr tablet Take 1 tablet (300 mg total) by mouth daily. 04/14/19   Norman Clay, MD  glipiZIDE (GLUCOTROL) 5 MG tablet Take 1 tablet (5 mg total) by mouth 2 (two) times daily before a meal. 03/08/20   Anastasio Champion, Nimish C, MD  lisinopril (ZESTRIL) 2.5 MG tablet Take 1 tablet by mouth once daily 10/12/19   Hurshel Party C, MD    metoprolol tartrate (LOPRESSOR) 50 MG tablet Take 1 tablet by mouth twice daily 10/12/19   Hurshel Party C, MD  torsemide (DEMADEX) 20 MG tablet Take 20 mg by mouth 2 (two) times daily.    [provider]    Allergies    Patient has no known allergies.  Review of Systems   Review of Systems  Constitutional: Negative for chills and fever.  Skin: Positive for color change. Negative for wound.       Abscess    Physical Exam Updated Vital Signs BP 114/83 (BP Location: Left Arm)   Pulse (!) 104   Temp 98.3 F (36.8 C) (Oral)   Resp 18   Ht '5\' 9"'$  (1.753 m)   Wt (!) 158.8 kg   LMP 06/04/2014   SpO2 99%   BMI 51.69 kg/m   Physical Exam Vitals and nursing note reviewed.  Constitutional:      General: She is not in acute distress.    Appearance: Normal appearance. She is well-developed and normal weight. She is not ill-appearing or diaphoretic.  HENT:     Head: Normocephalic and atraumatic.  Eyes:     General:        Right eye: No discharge.        Left eye: No discharge.  Pulmonary:     Effort: Pulmonary effort is normal. No respiratory distress.  Musculoskeletal:     Comments: Right axilla with 3 x 3 cm area of induration, with surrounding cellulitis extending downward 4 to 5 cm.  No expressible drainage or palpable fluctuance.  Skin:    General: Skin is warm and dry.     Comments: Few scattered scabbed over areas in the axilla and over the  trunk, no fluctuance or surrounding cellulitis  Neurological:     Mental Status: She is alert and oriented to person, place, and time.     Coordination: Coordination normal.  Psychiatric:        Mood and Affect: Mood normal.        Behavior: Behavior normal.     ED Results / Procedures / Treatments   Labs (all labs ordered are listed, but only abnormal results are displayed) Labs Reviewed - No data to display  EKG None  Radiology No results found.  Procedures Ultrasound ED Soft Tissue  Date/Time: 03/18/2020  9:18 PM Performed by: Jacqlyn Larsen, PA-C Authorized by: Jacqlyn Larsen, PA-C   Procedure details:    Indications: localization of abscess and evaluate for cellulitis     Transverse view:  Visualized   Longitudinal view:  Visualized   Images: archived   Location:    Location: axilla     Side:  Right Findings:     abscess present    cellulitis present .Marland KitchenIncision and Drainage  Date/Time: 03/18/2020 9:19 PM Performed by: Jacqlyn Larsen, PA-C Authorized by: Jacqlyn Larsen, PA-C   Consent:    Consent obtained:  Verbal   Consent given by:  Patient   Risks discussed:  Bleeding, incomplete drainage, pain, damage to other organs and infection   Alternatives discussed:  No treatment Location:    Type:  Abscess   Size:  3 cm with surrounding cellulitis extending down   Location: R axilla. Pre-procedure details:    Skin preparation:  Antiseptic wash Anesthesia (see MAR for exact dosages):    Anesthesia method:  Local infiltration   Local anesthetic:  Lidocaine 2% WITH epi Procedure type:    Complexity:  Complex Procedure details:    Incision types:  Single straight   Incision depth:  Dermal   Scalpel blade:  11   Wound management:  Irrigated with saline and probed and deloculated   Drainage:  Bloody and purulent   Drainage amount:  Moderate   Wound treatment:  Wound left open   Packing materials:  1/4 in iodoform gauze Post-procedure details:    Patient tolerance of procedure:  Tolerated well, no immediate complications   (including critical care time)  Medications Ordered in ED Medications  lidocaine-EPINEPHrine (XYLOCAINE W/EPI) 2 %-1:200000 (PF) injection 10 mL (has no administration in time range)    ED Course  I have reviewed the triage vital signs and the nursing notes.  Pertinent labs & imaging results that were available during my care of the patient were reviewed by me and considered in my medical decision making (see chart for details).    MDM  Rules/Calculators/A&P                     49 year old female presents with right axillary abscess with surrounding cellulitis.  Was started on doxycycline 2 days ago in urgent care but did not have incision and drainage performed.  Reports worsening pain and spreading redness despite taking antibiotics, she has had 3 doses in total so far.  She also has some small scabbed areas over her trunk.  Does have history of MRSA infection.  No history of hidradenitis or recurrent axillary abscesses.  Bedside ultrasound shows small fluid pocket, will proceed with incision and drainage.  Moderate amount of bloody purulent fluid removed.  Probed and deloculated with multiple loculations and tracts noted.  Packing placed.  After I&D patient had immediate improvement in pain and  surrounding redness.  We will have her complete course of doxycycline, and continue with warm compresses and soaks.  Packing removal in 2 days.  Strict return precautions discussed.  Patient expresses understanding and agreement with plan.  Discharged home in good condition.   Final Clinical Impression(s) / ED Diagnoses Final diagnoses:  Abscess of axilla, right    Rx / DC Orders ED Discharge Orders    None       Janet Berlin 03/18/20 2131    Margette Fast, MD 03/19/20 1205

## 2020-03-18 NOTE — Discharge Instructions (Addendum)
Complete entire course of doxycycline that was prescribed 2 days ago.  Continue with warm compresses and soaks.  Packing material need to be removed in 2 days, if area seems to be improving this can be done by you at home, recommend getting it wet in the shower before removing packing.  If you note worsening redness increasing pain or drainage, fevers or any other new or concerning symptoms return to the emergency department.

## 2020-03-18 NOTE — ED Triage Notes (Addendum)
Pt presents with complaints of abscess to the right axilla. Denies fevers Patient was prescribed doxycycline from UC , but states it is spreading.

## 2020-03-26 ENCOUNTER — Other Ambulatory Visit (INDEPENDENT_AMBULATORY_CARE_PROVIDER_SITE_OTHER): Payer: Self-pay | Admitting: Internal Medicine

## 2020-03-28 ENCOUNTER — Other Ambulatory Visit: Payer: Self-pay | Admitting: Obstetrics & Gynecology

## 2020-03-28 MED ORDER — TERCONAZOLE 0.4 % VA CREA: 1.0000 | TOPICAL_CREAM | Freq: Every day | VAGINAL | 0 refills | Status: DC

## 2020-04-07 ENCOUNTER — Other Ambulatory Visit (INDEPENDENT_AMBULATORY_CARE_PROVIDER_SITE_OTHER): Payer: Self-pay | Admitting: Internal Medicine

## 2020-05-02 ENCOUNTER — Other Ambulatory Visit (INDEPENDENT_AMBULATORY_CARE_PROVIDER_SITE_OTHER): Payer: Self-pay | Admitting: Internal Medicine

## 2020-05-13 ENCOUNTER — Other Ambulatory Visit (INDEPENDENT_AMBULATORY_CARE_PROVIDER_SITE_OTHER): Payer: Self-pay | Admitting: Internal Medicine

## 2020-05-26 ENCOUNTER — Other Ambulatory Visit (INDEPENDENT_AMBULATORY_CARE_PROVIDER_SITE_OTHER): Payer: Self-pay | Admitting: Internal Medicine

## 2020-05-28 ENCOUNTER — Other Ambulatory Visit: Payer: Self-pay

## 2020-05-28 ENCOUNTER — Emergency Department (HOSPITAL_COMMUNITY)
Admission: EM | Admit: 2020-05-28 | Discharge: 2020-05-28 | Disposition: A | Discharge status: Home / Self Care | Payer: Medicare Other | Attending: Emergency Medicine | Admitting: Emergency Medicine

## 2020-05-28 ENCOUNTER — Encounter (HOSPITAL_COMMUNITY): Payer: Self-pay

## 2020-05-28 DIAGNOSIS — Z79899 Other long term (current) drug therapy: Secondary | ICD-10-CM | POA: Diagnosis not present

## 2020-05-28 DIAGNOSIS — I5033 Acute on chronic diastolic (congestive) heart failure: Secondary | ICD-10-CM | POA: Insufficient documentation

## 2020-05-28 DIAGNOSIS — J449 Chronic obstructive pulmonary disease, unspecified: Secondary | ICD-10-CM | POA: Diagnosis not present

## 2020-05-28 DIAGNOSIS — I110 Hypertensive heart disease with heart failure: Secondary | ICD-10-CM | POA: Insufficient documentation

## 2020-05-28 DIAGNOSIS — L02411 Cutaneous abscess of right axilla: Secondary | ICD-10-CM | POA: Insufficient documentation

## 2020-05-28 DIAGNOSIS — J45909 Unspecified asthma, uncomplicated: Secondary | ICD-10-CM | POA: Insufficient documentation

## 2020-05-28 DIAGNOSIS — E119 Type 2 diabetes mellitus without complications: Secondary | ICD-10-CM | POA: Diagnosis not present

## 2020-05-28 DIAGNOSIS — Z7984 Long term (current) use of oral hypoglycemic drugs: Secondary | ICD-10-CM | POA: Diagnosis not present

## 2020-05-28 MED ORDER — HYDROCODONE-ACETAMINOPHEN 5-325 MG PO TABS: ORAL_TABLET | ORAL | 0 refills | Status: DC

## 2020-05-28 MED ORDER — MUPIROCIN 2 % EX OINT: TOPICAL_OINTMENT | CUTANEOUS | 0 refills | Status: DC

## 2020-05-28 MED ORDER — DOXYCYCLINE HYCLATE 100 MG PO CAPS: 100.0000 mg | ORAL_CAPSULE | Freq: Two times a day (BID) | ORAL | 0 refills | Status: DC

## 2020-05-28 NOTE — Discharge Instructions (Addendum)
Apply warm wet compresses on and off to your underarm area.  It is important that you take the antibiotic as directed until its finished.  Avoid squeezing or sticking needles or pins into the area.  Also, try cleaning the areas with Hibiclens that you may purchase from the drugstore without a prescription.  Clean the areas once a week with the Hibiclens, do not use it daily.

## 2020-05-28 NOTE — ED Triage Notes (Signed)
Pt reports abscess under both arms for past few days but r hurts worse than left.

## 2020-05-28 NOTE — ED Notes (Signed)
Small abscess/pimple with head to R axilla area   Here for eval

## 2020-05-29 NOTE — ED Provider Notes (Signed)
[facility] Provider Note   CSN: [id number] Arrival date & time: 05/28/20  1002     History Chief Complaint  Patient presents with  . Abscess    Erin Sexton is a 49 y.o. female.  HPI      Erin Sexton is a 49 y.o. female with past medical history of hypertension, type 2 diabetes, who presents to the Emergency Department requesting evaluation of a possible developing abscess of the right axilla.  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  She was seen here 1 month ago for similar symptoms.  She noticed a pimple with increasing pain to the area several days ago.  She denies redness of the area, fever, chills, malaise or abdominal pain.  She also notes having some milder symptoms of the left axilla.  No recent antibiotics since previous ER visit.    Past Medical History:  Diagnosis Date  . Abnormal CT scan    a. 02/2014: evaluated for dyspnea and initially placed on Xarelto for PE because radiologist could not exclude small peripheral pulmonary emboli because of limited contrast. However, after admission for ++vaginal bleeding several days later, it was felt she did NOT have PE - underwent further testing with neg VQ 03/06/14, neg LE duplex.  . Anemia   . Anxiety   . Asthma   . Blood transfusion without reported diagnosis   . Bone spur of right foot   . CHF (congestive heart failure) ([place])   . COPD (chronic obstructive pulmonary disease) ([place])   . Depression   . Diabetes mellitus without complication ([place])    a. Dx 02/2014.  [name] History of echocardiogram 2015   normal EF, Grade II diastolic dysfunction  . HOCM (hypertrophic obstructive cardiomyopathy) ([place])    a. Dx 56213.  [name] Hypertension   . Morbid obesity ([place])   . NSVT (nonsustained ventricular tachycardia) ([place])    a. Noted on tele during 02/2014 adm for symptomatic anemia.  [name] SVT (supraventricular tachycardia) ([place])    a. Noted on tele during 02/2014 adm for symptomatic anemia.  .  Vaginal bleeding     Patient Active Problem List   Diagnosis Date Noted  . Special screening for malignant neoplasms, colon 07/07/2019  . Facial droop 06/20/2016  . TIA (transient ischemic attack) 06/20/2016  . Chronic diastolic CHF (congestive heart failure) ([place]) 06/20/2016  . Complex endometrial hyperplasia without atypia 06/08/2014  . Menorrhagia 06/01/2014  . Physical deconditioning 05/25/2014  . Hypokalemia 05/20/2014  . Acute on chronic diastolic congestive heart failure ([place]) 05/18/2014  . Diabetes mellitus type 2, noninsulin dependent ([place])   . OSA (obstructive sleep apnea) 03/21/2014  . Acute blood loss anemia 03/06/2014  . HOCM (hypertrophic obstructive cardiomyopathy) ([place]) 03/06/2014  . Morbid obesity ([place]) 03/02/2014  . Essential hypertension, benign 03/02/2014  . Asthma, chronic 03/02/2014    Past Surgical History:  Procedure Laterality Date  . COLONOSCOPY N/A 09/30/2019   Procedure: COLONOSCOPY;  Surgeon: Rogene Houston, MD;  Location: [facility];  Service: Endoscopy;  Laterality: N/A;  730  . DILITATION & CURRETTAGE/HYSTROSCOPY WITH NOVASURE ABLATION N/A 08/07/2016   Procedure: DILATATION & CURETTAGE/HYSTEROSCOPY WITH ATTPEMPTED NOVASURE ABLATION AND INSERTION OF IUD;  Surgeon: Florian Buff, MD;  Location: [facility] ORS;  Service: Gynecology;  Laterality: N/A;     OB History    Gravida      Para      Term      Preterm      AB  Living  0     SAB      TAB      Ectopic      Multiple      Live Births              Family History  Problem Relation Age of Onset  . Diabetes Mother   . Arthritis Mother   . Other Mother        WPW  . Mental illness Mother        Depression  . Heart disease Father        History of coronary bypass grafting at age 3  . Mental illness Sister   . Bipolar disorder Sister   . Heart disease Sister        possibly WPW  . Uterine cancer Maternal Grandmother   . Mental illness Maternal Grandmother   . Heart  disease Maternal Grandfather   . CAD Neg Hx        No family history of EARLY CAD    Social History   Tobacco Use  . Smoking status: Never Smoker  . Smokeless tobacco: Never Used  Vaping Use  . Vaping Use: Never used  Substance Use Topics  . Alcohol use: No  . Drug use: No    Home Medications Prior to Admission medications   Medication Sig Start Date End Date Taking? Authorizing Provider  ACCU-CHEK GUIDE test strip USE 1 STRIP TO CHECK GLUCOSE ONCE DAILY 01/04/20   Hurshel Party C, MD  Accu-Chek Softclix Lancets lancets USE 1 LANCET TO CHECK GLUCOSE ONCE DAILY 02/01/20   Hurshel Party C, MD  albuterol (PROAIR HFA) 108 (90 Base) MCG/ACT inhaler Inhale 2 puffs into the lungs as needed (wheezing).     [provider]  albuterol (PROVENTIL) (5 MG/ML) 0.5% nebulizer solution Take 0.5 mLs (2.5 mg total) by nebulization every 6 (six) hours as needed for wheezing or shortness of breath. 02/22/17   Julianne Rice, MD  atorvastatin (LIPITOR) 80 MG tablet Take 1 tablet by mouth once daily 05/13/20   Anastasio Champion, Nimish C, MD  benzonatate (TESSALON) 100 MG capsule Take 1 capsule (100 mg total) by mouth every 8 (eight) hours. 03/10/20   Hall-Potvin, [place], PA-C  blood glucose meter kit and supplies KIT Dispense based on patient and insurance preference. Use once a day. Diagnosis code:E11.9 10/26/19   Doree Albee, MD  buPROPion (WELLBUTRIN SR) 150 MG 12 hr tablet Take 1 tablet by mouth twice daily 04/07/20   Hurshel Party C, MD  buPROPion (WELLBUTRIN XL) 300 MG 24 hr tablet Take 1 tablet by mouth once daily 11/08/19   Hurshel Party C, MD  cetirizine (ZYRTEC ALLERGY) 10 MG tablet Take 1 tablet (10 mg total) by mouth daily. 03/10/20   Hall-Potvin, [place], PA-C  Cholecalciferol (VITAMIN D PO) Take 15,000 Units by mouth daily.     [provider]  clotrimazole-betamethasone (LOTRISONE) cream APPLY CREAM TWICE A DAY FOR 7 DAYS 05/14/20   Doree Albee, MD  doxycycline  (VIBRAMYCIN) 100 MG capsule Take 1 capsule (100 mg total) by mouth 2 (two) times daily. 05/28/20   Artha Stavros, PA-C  estradiol (ESTRACE) 2 MG tablet Take 1 tablet (2 mg total) by mouth at bedtime. 11/16/19   Florian Buff, MD  glipiZIDE (GLUCOTROL) 5 MG tablet TAKE 1 TABLET BY MOUTH TWICE DAILY BEFORE A MEAL 03/11/20   Hurshel Party C, MD  HYDROcodone-acetaminophen (NORCO/VICODIN) 5-325 MG tablet Take one tab po q 4 hrs prn  pain 05/28/20   Kem Parkinson, PA-C  Lancet Device MISC Use to check your blood sugar daily 01/24/20   Ailene Ards, NP  lisinopril (ZESTRIL) 2.5 MG tablet Take 1 tablet by mouth once daily 04/07/20   Hurshel Party C, MD  megestrol (MEGACE) 40 MG tablet 1 tablet daily 11/16/19   Florian Buff, MD  metFORMIN (GLUCOPHAGE) 500 MG tablet Take 1 tablet by mouth twice daily 03/26/20   Doree Albee, MD  metoprolol tartrate (LOPRESSOR) 50 MG tablet Take 1 tablet by mouth twice daily 04/07/20   Doree Albee, MD  mupirocin ointment (BACTROBAN) 2 % Apply to the affected area 3 times daily x10 days 05/28/20   Kem Parkinson, PA-C  NP THYROID 120 MG tablet Take 1 tablet by mouth once daily 05/02/20   Doree Albee, MD  NP THYROID 30 MG tablet Take 1 tablet by mouth once daily 01/12/20   Hurshel Party C, MD  potassium chloride SA (KLOR-CON) 20 MEQ tablet Take 1 tablet by mouth twice daily 05/02/20   Hurshel Party C, MD  PREBIOTIC PRODUCT PO Take 2 capsules by mouth daily.    [provider]  predniSONE (DELTASONE) 20 MG tablet Take 2 tablets (40 mg total) by mouth daily. 03/10/20   Hall-Potvin, [place], PA-C  SYMBICORT 160-4.5 MCG/ACT inhaler Inhale 2 puffs into the lungs daily.  03/10/19   [provider]  terconazole (TERAZOL 7) 0.4 % vaginal cream Place 1 applicator vaginally at bedtime. 03/28/20   Florian Buff, MD  torsemide (DEMADEX) 20 MG tablet Take 1 tablet by mouth twice daily 03/16/20   Ailene Ards, NP  atorvastatin (LIPITOR) 80 MG tablet Take 80 mg  by mouth daily.  01/25/16   [provider]  buPROPion (WELLBUTRIN XL) 300 MG 24 hr tablet Take 1 tablet (300 mg total) by mouth daily. 04/14/19   Norman Clay, MD  glipiZIDE (GLUCOTROL) 5 MG tablet Take 1 tablet (5 mg total) by mouth 2 (two) times daily before a meal. 03/08/20   Anastasio Champion, Nimish C, MD  lisinopril (ZESTRIL) 2.5 MG tablet Take 1 tablet by mouth once daily 10/12/19   Hurshel Party C, MD  metFORMIN (GLUCOPHAGE) 500 MG tablet Take 1 tablet (500 mg total) by mouth 2 (two) times daily with a meal. 05/27/14   Angiulli, Lavon Paganini, PA-C  metoprolol tartrate (LOPRESSOR) 50 MG tablet Take 1 tablet by mouth twice daily 10/12/19   Hurshel Party C, MD  torsemide (DEMADEX) 20 MG tablet Take 20 mg by mouth 2 (two) times daily.    [provider]    Allergies    Patient has no known allergies.  Review of Systems   Review of Systems  Constitutional: Negative for chills and fever.  Cardiovascular: Negative for chest pain.  Gastrointestinal: Negative for nausea and vomiting.  Musculoskeletal: Negative for arthralgias and joint swelling.  Skin: Negative for color change.       Abscess right axilla  Hematological: Negative for adenopathy.    Physical Exam Updated Vital Signs BP (!) 107/43 (BP Location: Right Arm)   Pulse 78   Temp 98.5 F (36.9 C) (Oral)   Resp 18   Ht _0  (1.753 m)   Wt (!) 154.2 kg   LMP 06/04/2014   SpO2 98%   BMI 50.21 kg/m   Physical Exam Vitals and nursing note reviewed.  Constitutional:      General: She is not in acute distress.    Appearance: Normal appearance.  She is well-developed.  HENT:     Head: Atraumatic.  Cardiovascular:     Rate and Rhythm: Normal rate and regular rhythm.     Pulses: Normal pulses.     Heart sounds: No murmur heard.   Pulmonary:     Effort: No respiratory distress.     Breath sounds: Normal breath sounds.  Musculoskeletal:        General: Normal range of motion.  Skin:    General: Skin is warm.      Capillary Refill: Capillary refill takes less than 2 seconds.     Findings: No erythema.     Comments: Small pustule noted at the right axilla with surrounding induration.  No drainage or erythema.  No fluctuance noted.  Neurological:     Mental Status: She is alert.     Sensory: No sensory deficit.     Motor: No abnormal muscle tone.     ED Results / Procedures / Treatments   Labs (all labs ordered are listed, but only abnormal results are displayed) Labs Reviewed - No data to display  EKG None  Radiology No results found.  Procedures Procedures (including critical care time)  Medications Ordered in ED Medications - No data to display  ED Course  I have reviewed the triage vital signs and the nursing notes.  Pertinent labs & imaging results that were available during my care of the patient were reviewed by me and considered in my medical decision making (see chart for details).    MDM Rules/Calculators/A&P                           Patient here with history of MRSA and recurrent boils.  She is well-appearing.  Has a likely developing abscess of the right axilla.  There is no fluctuance or evidence of cellulitis at this time.  No indication for need of I&D.  I feel that she is appropriate for discharge home, agrees to treatment plan with warm compresses, doxycycline and close outpatient follow-up.  I have also prescribed Bactroban ointment to use prophylactically as this is a recurring problem for her.  Final Clinical Impression(s) / ED Diagnoses Final diagnoses:  Abscess of axilla, right    Rx / DC Orders ED Discharge Orders         Ordered    doxycycline (VIBRAMYCIN) 100 MG capsule  2 times daily     Discontinue  Reprint     05/28/20 1330    HYDROcodone-acetaminophen (NORCO/VICODIN) 5-325 MG tablet     Discontinue  Reprint     05/28/20 1330    mupirocin ointment (BACTROBAN) 2 %     Discontinue  Reprint     05/28/20 1330           Kem Parkinson,  PA-C 05/29/20 1319    Wyvonnia Dusky, MD 05/29/20 1754

## 2020-05-31 ENCOUNTER — Other Ambulatory Visit (INDEPENDENT_AMBULATORY_CARE_PROVIDER_SITE_OTHER): Payer: Self-pay | Admitting: Internal Medicine

## 2020-06-06 ENCOUNTER — Other Ambulatory Visit (INDEPENDENT_AMBULATORY_CARE_PROVIDER_SITE_OTHER): Payer: Self-pay | Admitting: Internal Medicine

## 2020-06-07 ENCOUNTER — Other Ambulatory Visit (INDEPENDENT_AMBULATORY_CARE_PROVIDER_SITE_OTHER): Payer: Self-pay | Admitting: Internal Medicine

## 2020-06-09 ENCOUNTER — Other Ambulatory Visit (INDEPENDENT_AMBULATORY_CARE_PROVIDER_SITE_OTHER): Payer: Self-pay | Admitting: Internal Medicine

## 2020-06-15 ENCOUNTER — Other Ambulatory Visit (INDEPENDENT_AMBULATORY_CARE_PROVIDER_SITE_OTHER): Payer: Self-pay | Admitting: Internal Medicine

## 2020-06-16 ENCOUNTER — Other Ambulatory Visit (INDEPENDENT_AMBULATORY_CARE_PROVIDER_SITE_OTHER): Payer: Self-pay | Admitting: Internal Medicine

## 2020-06-19 ENCOUNTER — Other Ambulatory Visit (INDEPENDENT_AMBULATORY_CARE_PROVIDER_SITE_OTHER): Payer: Self-pay | Admitting: Nurse Practitioner

## 2020-06-19 ENCOUNTER — Other Ambulatory Visit (INDEPENDENT_AMBULATORY_CARE_PROVIDER_SITE_OTHER): Payer: Self-pay | Admitting: Internal Medicine

## 2020-06-20 ENCOUNTER — Other Ambulatory Visit (INDEPENDENT_AMBULATORY_CARE_PROVIDER_SITE_OTHER): Payer: Self-pay | Admitting: Internal Medicine

## 2020-06-20 ENCOUNTER — Other Ambulatory Visit (INDEPENDENT_AMBULATORY_CARE_PROVIDER_SITE_OTHER): Payer: Self-pay | Admitting: Nurse Practitioner

## 2020-06-20 NOTE — Telephone Encounter (Signed)
I am not refilling any of her medications as we have not seen her for a long time and she needs to make an appointment first.  She can come in next week to see Judson Roch or me depending on the schedule.

## 2020-06-20 NOTE — Telephone Encounter (Signed)
Erin Sexton is calling stating that she needs a refill on all her medications and [facility] no longer carries the NP Thyroid at all she will need an alternative, please advise?

## 2020-06-20 NOTE — Telephone Encounter (Signed)
Shirlean Is aware she is scheduled next week with Judson Roch

## 2020-06-27 ENCOUNTER — Other Ambulatory Visit: Payer: Self-pay

## 2020-06-27 ENCOUNTER — Encounter (INDEPENDENT_AMBULATORY_CARE_PROVIDER_SITE_OTHER): Payer: Self-pay | Admitting: Gastroenterology

## 2020-06-27 ENCOUNTER — Other Ambulatory Visit (INDEPENDENT_AMBULATORY_CARE_PROVIDER_SITE_OTHER): Payer: Self-pay | Admitting: Internal Medicine

## 2020-06-27 ENCOUNTER — Encounter (INDEPENDENT_AMBULATORY_CARE_PROVIDER_SITE_OTHER): Payer: Self-pay | Admitting: Nurse Practitioner

## 2020-06-27 ENCOUNTER — Ambulatory Visit (INDEPENDENT_AMBULATORY_CARE_PROVIDER_SITE_OTHER): Payer: Medicare Other | Admitting: Nurse Practitioner

## 2020-06-27 ENCOUNTER — Telehealth (INDEPENDENT_AMBULATORY_CARE_PROVIDER_SITE_OTHER): Payer: Self-pay | Admitting: Nurse Practitioner

## 2020-06-27 VITALS — BP 135/80 | Temp 96.1°F | Ht 69.0 in | Wt 325.6 lb

## 2020-06-27 DIAGNOSIS — I5032 Chronic diastolic (congestive) heart failure: Secondary | ICD-10-CM

## 2020-06-27 DIAGNOSIS — J4540 Moderate persistent asthma, uncomplicated: Secondary | ICD-10-CM | POA: Diagnosis not present

## 2020-06-27 DIAGNOSIS — E119 Type 2 diabetes mellitus without complications: Secondary | ICD-10-CM | POA: Diagnosis not present

## 2020-06-27 DIAGNOSIS — L732 Hidradenitis suppurativa: Secondary | ICD-10-CM | POA: Diagnosis not present

## 2020-06-27 DIAGNOSIS — E785 Hyperlipidemia, unspecified: Secondary | ICD-10-CM

## 2020-06-27 DIAGNOSIS — R197 Diarrhea, unspecified: Secondary | ICD-10-CM

## 2020-06-27 DIAGNOSIS — I10 Essential (primary) hypertension: Secondary | ICD-10-CM | POA: Diagnosis not present

## 2020-06-27 DIAGNOSIS — E559 Vitamin D deficiency, unspecified: Secondary | ICD-10-CM | POA: Diagnosis not present

## 2020-06-27 MED ORDER — ATORVASTATIN CALCIUM 80 MG PO TABS: 80.0000 mg | ORAL_TABLET | Freq: Every day | ORAL | 0 refills | Status: DC

## 2020-06-27 MED ORDER — ALBUTEROL SULFATE (5 MG/ML) 0.5% IN NEBU: 2.5000 mg | INHALATION_SOLUTION | Freq: Four times a day (QID) | RESPIRATORY_TRACT | 0 refills | Status: DC | PRN

## 2020-06-27 MED ORDER — GLIPIZIDE 5 MG PO TABS: 5.0000 mg | ORAL_TABLET | Freq: Two times a day (BID) | ORAL | 0 refills | Status: DC

## 2020-06-27 MED ORDER — CLOTRIMAZOLE-BETAMETHASONE 1-0.05 % EX CREA: TOPICAL_CREAM | Freq: Two times a day (BID) | CUTANEOUS | 0 refills | Status: DC

## 2020-06-27 MED ORDER — LISINOPRIL 2.5 MG PO TABS: 2.5000 mg | ORAL_TABLET | Freq: Every day | ORAL | 0 refills | Status: DC

## 2020-06-27 MED ORDER — POTASSIUM CHLORIDE CRYS ER 20 MEQ PO TBCR: 20.0000 meq | EXTENDED_RELEASE_TABLET | Freq: Two times a day (BID) | ORAL | 0 refills | Status: DC

## 2020-06-27 MED ORDER — METOPROLOL TARTRATE 50 MG PO TABS: 50.0000 mg | ORAL_TABLET | Freq: Two times a day (BID) | ORAL | 0 refills | Status: DC

## 2020-06-27 MED ORDER — TORSEMIDE 20 MG PO TABS: 20.0000 mg | ORAL_TABLET | Freq: Two times a day (BID) | ORAL | 0 refills | Status: DC

## 2020-06-27 MED ORDER — METFORMIN HCL 500 MG PO TABS: 500.0000 mg | ORAL_TABLET | Freq: Two times a day (BID) | ORAL | 0 refills | Status: DC

## 2020-06-27 MED ORDER — BUPROPION HCL ER (XL) 300 MG PO TB24: 300.0000 mg | ORAL_TABLET | Freq: Every day | ORAL | 0 refills | Status: DC

## 2020-06-27 NOTE — Progress Notes (Signed)
Subjective:  Patient ID: Erin Sexton, female    DOB: 1971/05/04  Age: 49 y.o. MRN: [id number]  CC:  Chief Complaint  Patient presents with   Diabetes   vitamin d deficiency   Medication Refill   Other    Diarrhea, skin abscesses of her axilla   Depression   Hypertension   Hyperlipidemia   Hypothyroidism   Congestive Heart Failure      HPI  This patient comes in today for follow-up for chronic conditions.  She has not had a follow-up of her chronic conditions in nearly 8 months in this office.  She is out of most of her medications and would like refills today.  She has a medical history of diabetes, vitamin D deficiency, depression, hypertension, hyperlipidemia, hypothyroidism, congestive heart failure, and she has been experiencing regular episodes of loose stools as well as recently had abscesses in her axilla treated in urgent care.  She continues on her chronic medications without difficulty.  Most recent blood work was collected back in October 2020 and it did show A1c of 10, reduced renal function, I do not have a recent lipid panel.  Her main concern today is that she has been having regular loose stools for at least the last 8 months.  She tells me no matter what she eats within 30 minutes she will have diarrhea.  She is also been experiencing quite a bit of abdominal pain with her diarrhea.  She denies any blood in her stools.  She would like to be evaluated by gastroenterology.  As for the abscesses in her axilla she was evaluated and treated for these back in April.  She tells me that she has never experienced this before but they were quite painful and required I&D as well as antibiotic treatment.   Past Medical History:  Diagnosis Date   Abnormal CT scan    a. 02/2014: evaluated for dyspnea and initially placed on Xarelto for PE because radiologist could not exclude small peripheral pulmonary emboli because of limited contrast. However, after admission for  ++vaginal bleeding several days later, it was felt she did NOT have PE - underwent further testing with neg VQ 03/06/14, neg LE duplex.   Anemia    Anxiety    Asthma    Blood transfusion without reported diagnosis    Bone spur of right foot    CHF (congestive heart failure) (HCC)    COPD (chronic obstructive pulmonary disease) ([place])    Depression    Diabetes mellitus without complication ([place])    a. Dx 02/2014.   History of echocardiogram 2015   normal EF, Grade II diastolic dysfunction   HOCM (hypertrophic obstructive cardiomyopathy) ([place])    a. Dx 24097.   Hypertension    Morbid obesity ([place])    NSVT (nonsustained ventricular tachycardia) ([place])    a. Noted on tele during 02/2014 adm for symptomatic anemia.   SVT (supraventricular tachycardia) ([place])    a. Noted on tele during 02/2014 adm for symptomatic anemia.   Vaginal bleeding       Family History  Problem Relation Age of Onset   Diabetes Mother    Arthritis Mother    Other Mother        WPW   Mental illness Mother        Depression   Heart disease Father        History of coronary bypass grafting at age 32   Mental illness Sister  Bipolar disorder Sister   °• Heart disease Sister   °     possibly WPW  °• Uterine cancer Maternal Grandmother   °• Mental illness Maternal Grandmother   °• Heart disease Maternal Grandfather   °• CAD Neg Hx   °     No family history of EARLY CAD  ° ° °Social History  ° °Social History Narrative  ° Divorced for 3 years,married for 5 years.On disability since 2009 secondary to mental illness.  ° °Social History  ° °Tobacco Use  °• Smoking status: Never Smoker  °• Smokeless tobacco: Never Used  °Substance Use Topics  °• Alcohol use: No  ° ° ° °Current Meds  °Medication Sig  °• ACCU-CHEK GUIDE test strip USE 1 STRIP TO CHECK GLUCOSE ONCE DAILY  °• Accu-Chek Softclix Lancets lancets USE 1 LANCET TO CHECK GLUCOSE ONCE DAILY  °• albuterol (PROAIR HFA) 108 (90 Base) MCG/ACT inhaler  Inhale 2 puffs into the lungs as needed (wheezing).   °• albuterol (PROVENTIL) (5 MG/ML) 0.5% nebulizer solution Take 0.5 mLs (2.5 mg total) by nebulization every 6 (six) hours as needed for wheezing or shortness of breath.  °• atorvastatin (LIPITOR) 80 MG tablet Take 1 tablet (80 mg total) by mouth daily.  °• blood glucose meter kit and supplies KIT Dispense based on patient and insurance preference. Use once a day. °Diagnosis code:E11.9  °• cetirizine (ZYRTEC ALLERGY) 10 MG tablet Take 1 tablet (10 mg total) by mouth daily.  °• Cholecalciferol (VITAMIN D PO) Take 15,000 Units by mouth daily.   °• clotrimazole-betamethasone (LOTRISONE) cream Apply topically 2 (two) times daily.  °• estradiol (ESTRACE) 2 MG tablet Take 1 tablet (2 mg total) by mouth at bedtime.  °• glipiZIDE (GLUCOTROL) 5 MG tablet Take 1 tablet (5 mg total) by mouth 2 (two) times daily before a meal.  °• Lancet Device MISC Use to check your blood sugar daily  °• lisinopril (ZESTRIL) 2.5 MG tablet Take 1 tablet (2.5 mg total) by mouth daily.  °• megestrol (MEGACE) 40 MG tablet 1 tablet daily  °• metFORMIN (GLUCOPHAGE) 500 MG tablet Take 1 tablet (500 mg total) by mouth 2 (two) times daily.  °• metoprolol tartrate (LOPRESSOR) 50 MG tablet Take 1 tablet (50 mg total) by mouth 2 (two) times daily.  °• potassium chloride SA (KLOR-CON) 20 MEQ tablet Take 1 tablet (20 mEq total) by mouth 2 (two) times daily.  °• PREBIOTIC PRODUCT PO Take 2 capsules by mouth daily.  °• SYMBICORT 160-4.5 MCG/ACT inhaler Inhale 2 puffs into the lungs daily.   °• torsemide (DEMADEX) 20 MG tablet Take 1 tablet (20 mg total) by mouth 2 (two) times daily.  °• [DISCONTINUED] albuterol (PROVENTIL) (5 MG/ML) 0.5% nebulizer solution Take 0.5 mLs (2.5 mg total) by nebulization every 6 (six) hours as needed for wheezing or shortness of breath.  °• [DISCONTINUED] atorvastatin (LIPITOR) 80 MG tablet Take 1 tablet by mouth once daily  °• [DISCONTINUED] benzonatate (TESSALON) 100 MG  capsule Take 1 capsule (100 mg total) by mouth every 8 (eight) hours.  °• [DISCONTINUED] buPROPion (WELLBUTRIN SR) 150 MG 12 hr tablet Take 1 tablet by mouth twice daily  °• [DISCONTINUED] buPROPion (WELLBUTRIN XL) 300 MG 24 hr tablet Take 1 tablet by mouth once daily  °• [DISCONTINUED] clotrimazole-betamethasone (LOTRISONE) cream APPLY CREAM TWICE A DAY FOR 7 DAYS  °• [DISCONTINUED] doxycycline (VIBRAMYCIN) 100 MG capsule Take 1 capsule (100 mg total) by mouth 2 (two) times daily.  °• [DISCONTINUED] glipiZIDE (GLUCOTROL)   5 MG tablet TAKE 1 TABLET BY MOUTH TWICE DAILY BEFORE A MEAL  °• [DISCONTINUED] HYDROcodone-acetaminophen (NORCO/VICODIN) 5-325 MG tablet Take one tab po q 4 hrs prn pain  °• [DISCONTINUED] lisinopril (ZESTRIL) 2.5 MG tablet Take 1 tablet by mouth once daily  °• [DISCONTINUED] metFORMIN (GLUCOPHAGE) 500 MG tablet Take 1 tablet by mouth twice daily  °• [DISCONTINUED] metoprolol tartrate (LOPRESSOR) 50 MG tablet Take 1 tablet by mouth twice daily  °• [DISCONTINUED] mupirocin ointment (BACTROBAN) 2 % Apply to the affected area 3 times daily x10 days  °• [DISCONTINUED] NP THYROID 120 MG tablet Take 1 tablet by mouth once daily  °• [DISCONTINUED] potassium chloride SA (KLOR-CON) 20 MEQ tablet TAKE 1  BY MOUTH TWICE DAILY  °• [DISCONTINUED] predniSONE (DELTASONE) 20 MG tablet Take 2 tablets (40 mg total) by mouth daily.  °• [DISCONTINUED] terconazole (TERAZOL 7) 0.4 % vaginal cream Place 1 applicator vaginally at bedtime.  °• [DISCONTINUED] torsemide (DEMADEX) 20 MG tablet Take 1 tablet by mouth twice daily  ° ° °ROS:  °Review of Systems  °Constitutional: Positive for weight loss. Negative for fever and malaise/fatigue.  °Respiratory: Positive for cough and wheezing. Negative for shortness of breath.   °Cardiovascular: Negative for chest pain and palpitations.  °Gastrointestinal: Positive for abdominal pain and diarrhea. Negative for blood in stool and constipation.  °Skin: Positive for rash.   ° ° ° °Objective:  ° °Today's Vitals: BP 135/80 (BP Location: Left Arm, Patient Position: Sitting, Cuff Size: Normal)    Temp (!) 96.1 °F (35.6 °C) (Temporal)    Ht 5' 9" (1.753 m)    Wt (!) 325 lb 9.6 oz (147.7 kg)    LMP 06/04/2014    BMI 48.08 kg/m²  °Vitals with BMI 06/27/2020 05/28/2020 05/28/2020  °Height 5' 9" - -  °Weight 325 lbs 10 oz - -  °BMI 48.06 - -  °Systolic 135 107 107  °Diastolic 80 43 43  °Pulse - 78 78  °Some encounter information is confidential and restricted. Go to Review Flowsheets activity to see all data.  °  ° °Physical Exam °Vitals reviewed.  °Constitutional:   °   General: She is not in acute distress. °   Appearance: Normal appearance. She is obese.  °HENT:  °   Head: Normocephalic and atraumatic.  °Neck:  °   Vascular: No carotid bruit.  °Cardiovascular:  °   Rate and Rhythm: Normal rate and regular rhythm.  °   Pulses: Normal pulses.  °   Heart sounds: Normal heart sounds.  °Pulmonary:  °   Effort: Pulmonary effort is normal.  °   Breath sounds: Normal breath sounds.  °Skin: °   General: Skin is warm and dry.  °Neurological:  °   General: No focal deficit present.  °   Mental Status: She is alert and oriented to person, place, and time.  °Psychiatric:     °   Mood and Affect: Mood normal.     °   Behavior: Behavior normal.     °   Judgment: Judgment normal.  ° ° ° ° ° ° ° ° °Assessment and Plan  ° °1. Diabetes mellitus type 2, noninsulin dependent (HCC)   °2. Essential hypertension, benign   °3. Vitamin D deficiency disease   °4. Hyperlipidemia, unspecified hyperlipidemia type   °5. Moderate persistent chronic asthma without complication   °6. Chronic diastolic CHF (congestive heart failure) (HCC)   °7. Hidradenitis suppurativa   °8. Diarrhea, unspecified type   ° ° ° °  Plan: I will refill her chronic medications for her today.  I will also collect blood work today including CMP, A1c, thyroid panel, lipid panel, vitamin D level, and will check her urine for albuminuria.  I will refer  her to gastroenterology for further evaluation management of her diarrhea and will refer her to dermatology to consult regarding probable hydradenitis suppurativa.  She will follow-up in approximately 3 months, but was encouraged to call the office to be seen sooner as needed.   Tests ordered Orders Placed This Encounter  Procedures   Lipid Panel   CMP with eGFR([facility])   Vitamin D, 25-hydroxy   TSH   T3, Free   T4, Free   Hemoglobin A1c   Microalbumin/Creatinine Ratio, Urine   Ambulatory referral to Dermatology   Ambulatory referral to Gastroenterology      Meds ordered this encounter  Medications   albuterol (PROVENTIL) (5 MG/ML) 0.5% nebulizer solution    Sig: Take 0.5 mLs (2.5 mg total) by nebulization every 6 (six) hours as needed for wheezing or shortness of breath.    Dispense:  20 mL    Refill:  0    Order Specific Question:   Supervising Provider    Answer:   Hurshel Party C [[id number]]   atorvastatin (LIPITOR) 80 MG tablet    Sig: Take 1 tablet (80 mg total) by mouth daily.    Dispense:  90 tablet    Refill:  0    Order Specific Question:   Supervising Provider    Answer:   Doree Albee [[id number]]   buPROPion (WELLBUTRIN XL) 300 MG 24 hr tablet    Sig: Take 1 tablet (300 mg total) by mouth daily.    Dispense:  90 tablet    Refill:  0    Order Specific Question:   Supervising Provider    Answer:   Doree Albee [[id number]]   clotrimazole-betamethasone (LOTRISONE) cream    Sig: Apply topically 2 (two) times daily.    Dispense:  15 g    Refill:  0    Order Specific Question:   Supervising Provider    Answer:   Anastasio Champion, NIMISH C [[id number]]   glipiZIDE (GLUCOTROL) 5 MG tablet    Sig: Take 1 tablet (5 mg total) by mouth 2 (two) times daily before a meal.    Dispense:  180 tablet    Refill:  0    Order Specific Question:   Supervising Provider    Answer:   Hurshel Party C [[id number]]   lisinopril (ZESTRIL) 2.5 MG tablet    Sig: Take 1 tablet (2.5 mg total)  by mouth daily.    Dispense:  90 tablet    Refill:  0    Order Specific Question:   Supervising Provider    Answer:   Hurshel Party C [[id number]]   metFORMIN (GLUCOPHAGE) 500 MG tablet    Sig: Take 1 tablet (500 mg total) by mouth 2 (two) times daily.    Dispense:  180 tablet    Refill:  0    Order Specific Question:   Supervising Provider    Answer:   Hurshel Party C [[id number]]   metoprolol tartrate (LOPRESSOR) 50 MG tablet    Sig: Take 1 tablet (50 mg total) by mouth 2 (two) times daily.    Dispense:  180 tablet    Refill:  0    Order Specific Question:   Supervising Provider    Answer:   Doree Albee [[id number]]  potassium chloride SA (KLOR-CON) 20 MEQ tablet    Sig: Take 1 tablet (20 mEq total) by mouth 2 (two) times daily.    Dispense:  60 tablet    Refill:  0    Order Specific Question:   Supervising Provider    Answer:   Doree Albee [[id number]]   torsemide (DEMADEX) 20 MG tablet    Sig: Take 1 tablet (20 mg total) by mouth 2 (two) times daily.    Dispense:  180 tablet    Refill:  0    Order Specific Question:   Supervising Provider    Answer:   Doree Albee [[id number]]    Patient to follow-up in 3 months or sooner as needed.  Ailene Ards, NP

## 2020-06-27 NOTE — Telephone Encounter (Signed)
This patient came in today for follow-up today.  She tells me that her pharmacy [facility]) told her that they will no longer be caring NP thyroid that she needs to get a different medicine.  She was taking 120 mg of NP thyroid every morning.  Last dose was this morning, and was wondering what she should take instead.  If we are going to change her to levothyroxine, what dose would you recommend?

## 2020-06-27 NOTE — Telephone Encounter (Signed)
I do not think it is true that [facility] will not be caring NP thyroid.  It is on back order to my knowledge.  If she does not want to be on NP thyroid, I would recommend levothyroxine starting at a dose of 125 mcg daily.  Other pharmacies do have NP thyroid including [facility] if she wants to do this.

## 2020-06-27 NOTE — Telephone Encounter (Signed)
refill 

## 2020-06-27 NOTE — Telephone Encounter (Signed)
Please call patient and let her know that per Dr. Anastasio Champion he believes NP thyroid is only on back order at [facility].  However other pharmacies such as [facility] should have NP thyroid available.  If she would prefer to switch to levothyroxine and not be on NP thyroid and I can send a prescription of levothyroxine to [facility].  Please ask her what she would prefer, and let me know.  Thank you.

## 2020-06-28 ENCOUNTER — Other Ambulatory Visit (INDEPENDENT_AMBULATORY_CARE_PROVIDER_SITE_OTHER): Payer: Self-pay | Admitting: Internal Medicine

## 2020-06-28 ENCOUNTER — Encounter (INDEPENDENT_AMBULATORY_CARE_PROVIDER_SITE_OTHER): Payer: Self-pay | Admitting: Nurse Practitioner

## 2020-06-28 ENCOUNTER — Other Ambulatory Visit (INDEPENDENT_AMBULATORY_CARE_PROVIDER_SITE_OTHER): Payer: Self-pay | Admitting: Nurse Practitioner

## 2020-06-28 LAB — HEMOGLOBIN A1C
Hgb A1c MFr Bld: 11.7 % of total Hgb — ABNORMAL HIGH (ref <5.7)
Mean Plasma Glucose: 289 (calc)
eAG (mmol/L): 16 (calc)

## 2020-06-28 LAB — LIPID PANEL
Cholesterol: 81 mg/dL (ref <200)
HDL: 30 mg/dL — ABNORMAL LOW (ref >=50)
LDL Cholesterol (Calc): 21 mg/dL (calc)
Non-HDL Cholesterol (Calc): 51 mg/dL (calc) (ref <130)
Total CHOL/HDL Ratio: 2.7 (calc) (ref <5.0)
Triglycerides: 238 mg/dL — ABNORMAL HIGH (ref <150)

## 2020-06-28 LAB — VITAMIN D 25 HYDROXY (VIT D DEFICIENCY, FRACTURES): Vit D, 25-Hydroxy: 107 ng/mL — ABNORMAL HIGH (ref 30–100)

## 2020-06-28 LAB — COMPLETE METABOLIC PANEL WITH GFR
AG Ratio: 1.5 (calc) (ref 1.0–2.5)
ALT: 25 U/L (ref 6–29)
AST: 22 U/L (ref 10–35)
Albumin: 4 g/dL (ref 3.6–5.1)
Alkaline phosphatase (APISO): 97 U/L (ref 37–153)
BUN/Creatinine Ratio: 14 (calc) (ref 6–22)
BUN: 20 mg/dL (ref 7–25)
CO2: 23 mmol/L (ref 20–32)
Calcium: 9.6 mg/dL (ref 8.6–10.4)
Chloride: 100 mmol/L (ref 98–110)
Creat: 1.38 mg/dL — ABNORMAL HIGH (ref 0.50–1.05)
GFR, Est African American: 51 mL/min/{1.73_m2} — ABNORMAL LOW (ref >=60)
GFR, Est Non African American: 44 mL/min/{1.73_m2} — ABNORMAL LOW (ref >=60)
Globulin: 2.7 g/dL (calc) (ref 1.9–3.7)
Glucose, Bld: 274 mg/dL — ABNORMAL HIGH (ref 65–99)
Potassium: 3.5 mmol/L (ref 3.5–5.3)
Sodium: 138 mmol/L (ref 135–146)
Total Bilirubin: 0.9 mg/dL (ref 0.2–1.2)
Total Protein: 6.7 g/dL (ref 6.1–8.1)

## 2020-06-28 LAB — T3, FREE: T3, Free: 4.9 pg/mL — ABNORMAL HIGH (ref 2.3–4.2)

## 2020-06-28 LAB — MICROALBUMIN / CREATININE URINE RATIO
Creatinine, Urine: 179 mg/dL (ref 20–275)
Microalb Creat Ratio: 67 mcg/mg creat — ABNORMAL HIGH (ref <30)
Microalb, Ur: 12 mg/dL

## 2020-06-28 LAB — T4, FREE: Free T4: 1.6 ng/dL (ref 0.8–1.8)

## 2020-06-28 LAB — TSH: TSH: 2.16 mIU/L

## 2020-06-28 MED ORDER — THYROID 60 MG PO TABS
120.0000 mg | ORAL_TABLET | Freq: Every day | ORAL | 3 refills | Status: DC
Start: 2020-06-28 — End: 2020-06-29

## 2020-06-28 MED ORDER — VITAMIN D 125 MCG (5000 UT) PO CAPS
10000.0000 [IU] | ORAL_CAPSULE | Freq: Every day | ORAL | 0 refills | Status: DC
Start: 2020-06-28 — End: 2021-05-08

## 2020-06-28 MED ORDER — NP THYROID 30 MG PO TABS: 30.0000 mg | ORAL_TABLET | Freq: Every day | ORAL | 3 refills | Status: DC

## 2020-06-28 NOTE — Telephone Encounter (Signed)
Erin Sexton is stating that the NP Thyroid 120 & 30 is working great for her, she said calling it in to [facility] on [address] is fine

## 2020-06-29 ENCOUNTER — Other Ambulatory Visit (INDEPENDENT_AMBULATORY_CARE_PROVIDER_SITE_OTHER): Payer: Self-pay | Admitting: Internal Medicine

## 2020-06-29 ENCOUNTER — Encounter (INDEPENDENT_AMBULATORY_CARE_PROVIDER_SITE_OTHER): Payer: Self-pay | Admitting: Nurse Practitioner

## 2020-06-29 MED ORDER — THYROID 60 MG PO TABS: 120.0000 mg | ORAL_TABLET | Freq: Every day | ORAL | 3 refills | Status: DC

## 2020-06-30 ENCOUNTER — Other Ambulatory Visit (HOSPITAL_COMMUNITY): Payer: Self-pay | Admitting: Internal Medicine

## 2020-06-30 DIAGNOSIS — Z1231 Encounter for screening mammogram for malignant neoplasm of breast: Secondary | ICD-10-CM

## 2020-07-03 ENCOUNTER — Other Ambulatory Visit (INDEPENDENT_AMBULATORY_CARE_PROVIDER_SITE_OTHER): Payer: Self-pay | Admitting: Internal Medicine

## 2020-07-11 ENCOUNTER — Encounter (INDEPENDENT_AMBULATORY_CARE_PROVIDER_SITE_OTHER): Payer: Self-pay | Admitting: Internal Medicine

## 2020-07-11 DIAGNOSIS — E119 Type 2 diabetes mellitus without complications: Secondary | ICD-10-CM

## 2020-07-11 DIAGNOSIS — J4540 Moderate persistent asthma, uncomplicated: Secondary | ICD-10-CM

## 2020-07-11 DIAGNOSIS — I10 Essential (primary) hypertension: Secondary | ICD-10-CM

## 2020-07-11 DIAGNOSIS — E559 Vitamin D deficiency, unspecified: Secondary | ICD-10-CM

## 2020-07-11 DIAGNOSIS — I5032 Chronic diastolic (congestive) heart failure: Secondary | ICD-10-CM

## 2020-07-11 DIAGNOSIS — E785 Hyperlipidemia, unspecified: Secondary | ICD-10-CM

## 2020-07-11 MED ORDER — METOPROLOL TARTRATE 50 MG PO TABS: 50.0000 mg | ORAL_TABLET | Freq: Two times a day (BID) | ORAL | 0 refills | Status: DC

## 2020-07-11 MED ORDER — POTASSIUM CHLORIDE CRYS ER 20 MEQ PO TBCR: 20.0000 meq | EXTENDED_RELEASE_TABLET | Freq: Two times a day (BID) | ORAL | 0 refills | Status: DC

## 2020-07-17 DIAGNOSIS — D225 Melanocytic nevi of trunk: Secondary | ICD-10-CM | POA: Diagnosis not present

## 2020-07-17 DIAGNOSIS — Z1283 Encounter for screening for malignant neoplasm of skin: Secondary | ICD-10-CM | POA: Diagnosis not present

## 2020-07-17 DIAGNOSIS — L02421 Furuncle of right axilla: Secondary | ICD-10-CM | POA: Diagnosis not present

## 2020-07-17 DIAGNOSIS — B9689 Other specified bacterial agents as the cause of diseases classified elsewhere: Secondary | ICD-10-CM | POA: Diagnosis not present

## 2020-07-17 DIAGNOSIS — L02221 Furuncle of abdominal wall: Secondary | ICD-10-CM | POA: Diagnosis not present

## 2020-07-25 ENCOUNTER — Ambulatory Visit (INDEPENDENT_AMBULATORY_CARE_PROVIDER_SITE_OTHER): Payer: Medicare Other | Admitting: Nurse Practitioner

## 2020-07-25 ENCOUNTER — Other Ambulatory Visit: Payer: Self-pay

## 2020-07-25 ENCOUNTER — Encounter (INDEPENDENT_AMBULATORY_CARE_PROVIDER_SITE_OTHER): Payer: Self-pay | Admitting: Nurse Practitioner

## 2020-07-25 VITALS — BP 125/85 | HR 83 | Temp 97.5°F | Ht 69.0 in | Wt 334.8 lb

## 2020-07-25 DIAGNOSIS — E559 Vitamin D deficiency, unspecified: Secondary | ICD-10-CM | POA: Diagnosis not present

## 2020-07-25 DIAGNOSIS — E119 Type 2 diabetes mellitus without complications: Secondary | ICD-10-CM

## 2020-07-25 NOTE — Progress Notes (Signed)
Subjective:  Patient ID: Erin Sexton, female    DOB: September 13, 1971  Age: 49 y.o. MRN: [id number]  CC:  Chief Complaint  Patient presents with  . Diabetes  . Other    Vitamin D deficiency      HPI  This patient comes in today for the above.  At her last office visit approximately 1 month ago her A1c came back at 11.7.  She still continues on her glipizide 5 mg twice daily, and metformin 500 mg twice daily.  She is on statin and ACE inhibitor.  Last metabolic panel showed estimated GFR was 44.  She is here today to discuss medication adjustments aimed at improving her diabetes.  She has has a history of vitamin D deficiency and last serum level collected about 1 month ago showed that her serum vitamin D was 107.  Calcium level was normal.  Past Medical History:  Diagnosis Date  . Abnormal CT scan    a. 02/2014: evaluated for dyspnea and initially placed on Xarelto for PE because radiologist could not exclude small peripheral pulmonary emboli because of limited contrast. However, after admission for ++vaginal bleeding several days later, it was felt she did NOT have PE - underwent further testing with neg VQ 03/06/14, neg LE duplex.  . Anemia   . Anxiety   . Asthma   . Blood transfusion without reported diagnosis   . Bone spur of right foot   . CHF (congestive heart failure) ([place])   . COPD (chronic obstructive pulmonary disease) ([place])   . Depression   . Diabetes mellitus without complication ([place])    a. Dx 02/2014.  [name] History of echocardiogram 2015   normal EF, Grade II diastolic dysfunction  . HOCM (hypertrophic obstructive cardiomyopathy) ([place])    a. Dx 50277.  [name] Hypertension   . Morbid obesity ([place])   . NSVT (nonsustained ventricular tachycardia) ([place])    a. Noted on tele during 02/2014 adm for symptomatic anemia.  [name] SVT (supraventricular tachycardia) ([place])    a. Noted on tele during 02/2014 adm for symptomatic anemia.  . Vaginal bleeding       Family History    Problem Relation Age of Onset  . Diabetes Mother   . Arthritis Mother   . Other Mother        WPW  . Mental illness Mother        Depression  . Heart disease Father        History of coronary bypass grafting at age 51  . Mental illness Sister   . Bipolar disorder Sister   . Heart disease Sister        possibly WPW  . Uterine cancer Maternal Grandmother   . Mental illness Maternal Grandmother   . Heart disease Maternal Grandfather   . CAD Neg Hx        No family history of EARLY CAD    Social History   Social History Narrative   Divorced for 3 years,married for 5 years.On disability since 2009 secondary to mental illness.   Social History   Tobacco Use  . Smoking status: Never Smoker  . Smokeless tobacco: Never Used  Substance Use Topics  . Alcohol use: No     Current Meds  Medication Sig  . ACCU-CHEK GUIDE test strip USE 1 STRIP TO CHECK GLUCOSE ONCE DAILY  . Accu-Chek Softclix Lancets lancets USE 1 LANCET TO CHECK GLUCOSE ONCE DAILY  . albuterol (PROAIR HFA) 108 (90 Base)  MCG/ACT inhaler Inhale 2 puffs into the lungs as needed (wheezing).   [name] albuterol (PROVENTIL) (5 MG/ML) 0.5% nebulizer solution Take 0.5 mLs (2.5 mg total) by nebulization every 6 (six) hours as needed for wheezing or shortness of breath.  [name] atorvastatin (LIPITOR) 80 MG tablet Take 1 tablet (80 mg total) by mouth daily.  . blood glucose meter kit and supplies KIT Dispense based on patient and insurance preference. Use once a day. Diagnosis code:E11.9  . buPROPion (WELLBUTRIN XL) 300 MG 24 hr tablet Take 1 tablet (300 mg total) by mouth daily.  . cetirizine (ZYRTEC ALLERGY) 10 MG tablet Take 1 tablet (10 mg total) by mouth daily.  . Cholecalciferol (VITAMIN D) 125 MCG (5000 UT) CAPS Take 10,000 Units by mouth daily.  . clotrimazole-betamethasone (LOTRISONE) cream Apply topically 2 (two) times daily.  [name] estradiol (ESTRACE) 2 MG tablet Take 1 tablet (2 mg total) by mouth at bedtime.  [name] glipiZIDE  (GLUCOTROL) 5 MG tablet Take 1 tablet (5 mg total) by mouth 2 (two) times daily before a meal.  . Lancet Device MISC Use to check your blood sugar daily  . lisinopril (ZESTRIL) 2.5 MG tablet Take 1 tablet (2.5 mg total) by mouth daily.  . megestrol (MEGACE) 40 MG tablet 1 tablet daily  . metFORMIN (GLUCOPHAGE) 500 MG tablet Take 1 tablet (500 mg total) by mouth 2 (two) times daily.  . metoprolol tartrate (LOPRESSOR) 50 MG tablet Take 1 tablet (50 mg total) by mouth 2 (two) times daily.  . NP THYROID 30 MG tablet Take 1 tablet by mouth once daily  . NP THYROID 30 MG tablet Take 1 tablet (30 mg total) by mouth daily before breakfast.  . potassium chloride SA (KLOR-CON) 20 MEQ tablet Take 1 tablet (20 mEq total) by mouth 2 (two) times daily.  [name] PREBIOTIC PRODUCT PO Take 2 capsules by mouth daily.  . SYMBICORT 160-4.5 MCG/ACT inhaler Inhale 2 puffs into the lungs daily.   [name] thyroid (NP THYROID) 60 MG tablet Take 2 tablets (120 mg total) by mouth daily before breakfast.  . torsemide (DEMADEX) 20 MG tablet Take 1 tablet (20 mg total) by mouth 2 (two) times daily.    ROS:  Review of Systems  Constitutional: Negative.   Respiratory: Negative.   Cardiovascular: Negative.   Neurological: Negative.      Objective:   Today's Vitals: BP 125/85 (BP Location: Left Arm, Patient Position: Sitting, Cuff Size: Normal)   Pulse 83   Temp (!) 97.5 F (36.4 C) (Temporal)   Ht _0  (1.753 m)   Wt (!) 334 lb 12.8 oz (151.9 kg)   LMP 06/04/2014   SpO2 96%   BMI 49.44 kg/m  Vitals with BMI 07/25/2020 06/27/2020 05/28/2020  Height _1  _2  -  Weight 334 lbs 13 oz 325 lbs 10 oz -  BMI 60.73 71.06 -  Systolic 269 485 462  Diastolic 85 80 43  Pulse 83 - 78  Some encounter information is confidential and restricted. Go to Review Flowsheets activity to see all data.     Physical Exam Vitals reviewed.  Constitutional:      General: She is not in acute distress.    Appearance: Normal appearance.    HENT:     Head: Normocephalic and atraumatic.  Neck:     Vascular: No carotid bruit.  Cardiovascular:     Rate and Rhythm: Normal rate and regular rhythm.     Pulses: Normal pulses.  Heart sounds: Normal heart sounds.  Pulmonary:     Effort: Pulmonary effort is normal.     Breath sounds: Normal breath sounds.  Skin:    General: Skin is warm and dry.  Neurological:     General: No focal deficit present.     Mental Status: She is alert and oriented to person, place, and time.  Psychiatric:        Mood and Affect: Mood normal.        Behavior: Behavior normal.        Judgment: Judgment normal.          Assessment and Plan   1. Diabetes mellitus type 2, noninsulin dependent ([place])   2. Vitamin D deficiency disease      Plan: 1.  We discussed treatment options and I recommended considering either Trulicity or Jardiance.  She was referred to try Jardiance, and samples of 10 mg tablets were provided to the patient today in the office.  She will return in 2 weeks to recheck metabolic panel.  Need to consider reducing dose of torsemide pending metabolic panel recheck results.  I did discuss side effects including increased urination, risk for UTI, risk for yeast infection, and severe but rare risk for  Fournier gangrene.  She is instructed to let me know if she experiences any dysuria, rash, or pain in her groin area with starting his medication.  She was also encouraged to continue checking her blood sugars regularly at home.  2.  I did recommend that she reduce her intake of vitamin D3 from 15,000 to 10,000 IUs daily.  She tells me she has made this change since blood was drawn proximally 1 month ago.   Tests ordered No orders of the defined types were placed in this encounter.     No orders of the defined types were placed in this encounter.   Patient to follow-up in 2 weeks to recheck metabolic panel and vitamin D3 level.  Ailene Ards, NP

## 2020-08-07 ENCOUNTER — Ambulatory Visit (HOSPITAL_COMMUNITY): Payer: Medicare Other

## 2020-08-07 ENCOUNTER — Other Ambulatory Visit: Payer: Self-pay

## 2020-08-07 ENCOUNTER — Ambulatory Visit (HOSPITAL_COMMUNITY)
Admission: RE | Admit: 2020-08-07 | Discharge: 2020-08-07 | Disposition: A | Discharge status: Home / Self Care | Payer: Medicare Other | Source: Ambulatory Visit | Attending: Internal Medicine | Admitting: Internal Medicine

## 2020-08-07 DIAGNOSIS — Z1231 Encounter for screening mammogram for malignant neoplasm of breast: Secondary | ICD-10-CM | POA: Diagnosis not present

## 2020-08-10 ENCOUNTER — Other Ambulatory Visit: Payer: Self-pay

## 2020-08-10 ENCOUNTER — Ambulatory Visit (INDEPENDENT_AMBULATORY_CARE_PROVIDER_SITE_OTHER): Payer: Medicare Other | Admitting: Nurse Practitioner

## 2020-08-10 ENCOUNTER — Encounter (INDEPENDENT_AMBULATORY_CARE_PROVIDER_SITE_OTHER): Payer: Self-pay | Admitting: Nurse Practitioner

## 2020-08-10 VITALS — BP 120/80 | HR 79 | Temp 96.9°F | Ht 69.0 in | Wt 320.6 lb

## 2020-08-10 DIAGNOSIS — E559 Vitamin D deficiency, unspecified: Secondary | ICD-10-CM

## 2020-08-10 DIAGNOSIS — E119 Type 2 diabetes mellitus without complications: Secondary | ICD-10-CM

## 2020-08-10 NOTE — Progress Notes (Signed)
Subjective:  Patient ID: Erin Sexton, female    DOB: 1971-06-29  Age: 49 y.o. MRN: [id number]  CC:  Chief Complaint  Patient presents with  . Diabetes  . Follow-up  . Other    Vitamin D deficiency      HPI  This patient has today for follow-up of the above.  Diabetes: Last A1c was 11.7 this was collected approximately 6 weeks ago.  She continues on statin and lisinopril.  She is also taking glipizide and Metformin.  At last office visit we initiated her on Jardiance and she tells me since taking the medicine she is done feeling well.  She does report a little bit increased frequency in urination, but denies any dysuria, vaginal itching, or rashes.  She has also noted that the swelling in her legs has improved since starting Jardiance and this is very pleasing to her.  She has been checking her blood sugars at home and tells me that they seem to be running a little bit better except for this morning it was elevated around 200.  Vitamin D deficiency: She also has history of vitamin D deficiency and last serum level was 107 approximately 6 weeks ago.  Recommendation was that she reduce her intake from 15,000 IUs of vitamin D3 down to 10,000 IUs of vitamin D3.  She tells me she has made this change.  Past Medical History:  Diagnosis Date  . Abnormal CT scan    a. 02/2014: evaluated for dyspnea and initially placed on Xarelto for PE because radiologist could not exclude small peripheral pulmonary emboli because of limited contrast. However, after admission for ++vaginal bleeding several days later, it was felt she did NOT have PE - underwent further testing with neg VQ 03/06/14, neg LE duplex.  . Anemia   . Anxiety   . Asthma   . Blood transfusion without reported diagnosis   . Bone spur of right foot   . CHF (congestive heart failure) ([place])   . COPD (chronic obstructive pulmonary disease) ([place])   . Depression   . Diabetes mellitus without complication ([place])    a. Dx 02/2014.    [name] History of echocardiogram 2015   normal EF, Grade II diastolic dysfunction  . HOCM (hypertrophic obstructive cardiomyopathy) ([place])    a. Dx 70177.  [name] Hypertension   . Morbid obesity ([place])   . NSVT (nonsustained ventricular tachycardia) ([place])    a. Noted on tele during 02/2014 adm for symptomatic anemia.  [name] SVT (supraventricular tachycardia) ([place])    a. Noted on tele during 02/2014 adm for symptomatic anemia.  . Vaginal bleeding       Family History  Problem Relation Age of Onset  . Diabetes Mother   . Arthritis Mother   . Other Mother        WPW  . Mental illness Mother        Depression  . Heart disease Father        History of coronary bypass grafting at age 13  . Mental illness Sister   . Bipolar disorder Sister   . Heart disease Sister        possibly WPW  . Uterine cancer Maternal Grandmother   . Mental illness Maternal Grandmother   . Heart disease Maternal Grandfather   . CAD Neg Hx        No family history of EARLY CAD    Social History   Social History Narrative   Divorced for 3  years,married for 5 years.On disability since 2009 secondary to mental illness.   Social History   Tobacco Use  . Smoking status: Never Smoker  . Smokeless tobacco: Never Used  Substance Use Topics  . Alcohol use: No     Current Meds  Medication Sig  . ACCU-CHEK GUIDE test strip USE 1 STRIP TO CHECK GLUCOSE ONCE DAILY  . Accu-Chek Softclix Lancets lancets USE 1 LANCET TO CHECK GLUCOSE ONCE DAILY  . albuterol (PROAIR HFA) 108 (90 Base) MCG/ACT inhaler Inhale 2 puffs into the lungs as needed (wheezing).   [name] albuterol (PROVENTIL) (5 MG/ML) 0.5% nebulizer solution Take 0.5 mLs (2.5 mg total) by nebulization every 6 (six) hours as needed for wheezing or shortness of breath.  [name] atorvastatin (LIPITOR) 80 MG tablet Take 1 tablet (80 mg total) by mouth daily.  . blood glucose meter kit and supplies KIT Dispense based on patient and insurance preference. Use once a day. Diagnosis  code:E11.9  . buPROPion (WELLBUTRIN XL) 300 MG 24 hr tablet Take 1 tablet (300 mg total) by mouth daily.  . cetirizine (ZYRTEC ALLERGY) 10 MG tablet Take 1 tablet (10 mg total) by mouth daily.  . Cholecalciferol (VITAMIN D) 125 MCG (5000 UT) CAPS Take 10,000 Units by mouth daily.  . clotrimazole-betamethasone (LOTRISONE) cream Apply topically 2 (two) times daily.  . empagliflozin (JARDIANCE) 10 MG TABS tablet Take 10 mg by mouth daily.  [name] estradiol (ESTRACE) 2 MG tablet Take 1 tablet (2 mg total) by mouth at bedtime.  [name] glipiZIDE (GLUCOTROL) 5 MG tablet Take 1 tablet (5 mg total) by mouth 2 (two) times daily before a meal.  . Lancet Device MISC Use to check your blood sugar daily  . lisinopril (ZESTRIL) 2.5 MG tablet Take 1 tablet (2.5 mg total) by mouth daily.  . megestrol (MEGACE) 40 MG tablet 1 tablet daily  . metFORMIN (GLUCOPHAGE) 500 MG tablet Take 1 tablet (500 mg total) by mouth 2 (two) times daily.  . metoprolol tartrate (LOPRESSOR) 50 MG tablet Take 1 tablet (50 mg total) by mouth 2 (two) times daily.  . NP THYROID 30 MG tablet Take 1 tablet by mouth once daily  . NP THYROID 30 MG tablet Take 1 tablet (30 mg total) by mouth daily before breakfast.  . potassium chloride SA (KLOR-CON) 20 MEQ tablet Take 1 tablet (20 mEq total) by mouth 2 (two) times daily.  [name] PREBIOTIC PRODUCT PO Take 2 capsules by mouth daily.  . SYMBICORT 160-4.5 MCG/ACT inhaler Inhale 2 puffs into the lungs daily.   [name] thyroid (NP THYROID) 60 MG tablet Take 2 tablets (120 mg total) by mouth daily before breakfast.  . torsemide (DEMADEX) 20 MG tablet Take 1 tablet (20 mg total) by mouth 2 (two) times daily.    ROS:  Review of Systems  Constitutional: Negative for malaise/fatigue.  Eyes: Positive for blurred vision.  Respiratory: Negative for shortness of breath.   Cardiovascular: Negative for chest pain.  Genitourinary: Positive for frequency. Negative for dysuria and urgency.  Skin: Negative for rash.    Endo/Heme/Allergies: Negative for polydipsia.       (-) polyphagia     Objective:   Today's Vitals: BP 120/80 (BP Location: Left Arm, Patient Position: Sitting, Cuff Size: Normal)   Pulse 79   Temp (!) 96.9 F (36.1 C) (Temporal)   Ht $R'5\' 9"'iU$  (1.753 m)   Wt (!) 320 lb 9.6 oz (145.4 kg)   LMP 06/04/2014   SpO2 94%   BMI 47.34  kg/m  Vitals with BMI 08/10/2020 07/25/2020 06/27/2020  Height $Remov'5\' 9"'EvTLZb$  $Remove'5\' 9"'QrhnMNq$  $RemoveB'5\' 9"'jljwlzZA$   Weight 320 lbs 10 oz 334 lbs 13 oz 325 lbs 10 oz  BMI 47.32 28.36 62.94  Systolic 765 465 035  Diastolic 80 85 80  Pulse 79 83 -  Some encounter information is confidential and restricted. Go to Review Flowsheets activity to see all data.     Physical Exam Vitals reviewed.  Constitutional:      General: She is not in acute distress.    Appearance: Normal appearance.  HENT:     Head: Normocephalic and atraumatic.  Neck:     Vascular: No carotid bruit.  Cardiovascular:     Rate and Rhythm: Normal rate and regular rhythm.     Pulses: Normal pulses.     Heart sounds: Normal heart sounds.  Pulmonary:     Effort: Pulmonary effort is normal.     Breath sounds: Normal breath sounds.  Skin:    General: Skin is warm and dry.  Neurological:     General: No focal deficit present.     Mental Status: She is alert and oriented to person, place, and time.  Psychiatric:        Mood and Affect: Mood normal.        Behavior: Behavior normal.        Judgment: Judgment normal.          Assessment and Plan   1. Diabetes mellitus type 2, noninsulin dependent ([place])   2. Vitamin D deficiency disease      Plan: 1., 2.  We will collect metabolic panel today to evaluate kidney function and electrolyte levels.  We will also collect a vitamin D serum level today as well.  Further recommendations may be made based upon the results of her blood work.  For now she continue on all her medications as prescribed and we will plan on having her follow-up as scheduled in October with Dr.  Anastasio Champion.   Tests ordered Orders Placed This Encounter  Procedures  . CMP with eGFR([facility])  . Vitamin D, 25-hydroxy      No orders of the defined types were placed in this encounter.   Patient to follow-up in October as scheduled, or sooner as needed.  Ailene Ards, NP

## 2020-08-11 LAB — COMPLETE METABOLIC PANEL WITH GFR
AG Ratio: 1.6 (calc) (ref 1.0–2.5)
ALT: 22 U/L (ref 6–29)
AST: 25 U/L (ref 10–35)
Albumin: 4.1 g/dL (ref 3.6–5.1)
Alkaline phosphatase (APISO): 100 U/L (ref 37–153)
BUN/Creatinine Ratio: 18 (calc) (ref 6–22)
BUN: 21 mg/dL (ref 7–25)
CO2: 23 mmol/L (ref 20–32)
Calcium: 8.4 mg/dL — ABNORMAL LOW (ref 8.6–10.4)
Chloride: 103 mmol/L (ref 98–110)
Creat: 1.2 mg/dL — ABNORMAL HIGH (ref 0.50–1.05)
GFR, Est African American: 61 mL/min/{1.73_m2} (ref >=60)
GFR, Est Non African American: 52 mL/min/{1.73_m2} — ABNORMAL LOW (ref >=60)
Globulin: 2.6 g/dL (calc) (ref 1.9–3.7)
Glucose, Bld: 178 mg/dL — ABNORMAL HIGH (ref 65–99)
Potassium: 3.7 mmol/L (ref 3.5–5.3)
Sodium: 139 mmol/L (ref 135–146)
Total Bilirubin: 0.7 mg/dL (ref 0.2–1.2)
Total Protein: 6.7 g/dL (ref 6.1–8.1)

## 2020-08-11 LAB — VITAMIN D 25 HYDROXY (VIT D DEFICIENCY, FRACTURES): Vit D, 25-Hydroxy: 79 ng/mL (ref 30–100)

## 2020-08-16 ENCOUNTER — Other Ambulatory Visit: Payer: Self-pay | Admitting: *Deleted

## 2020-08-16 ENCOUNTER — Other Ambulatory Visit (INDEPENDENT_AMBULATORY_CARE_PROVIDER_SITE_OTHER): Payer: Self-pay | Admitting: Internal Medicine

## 2020-08-16 MED ORDER — EMPAGLIFLOZIN 10 MG PO TABS: 10.0000 mg | ORAL_TABLET | Freq: Every day | ORAL | 3 refills | Status: DC

## 2020-08-17 ENCOUNTER — Telehealth: Payer: Self-pay | Admitting: Obstetrics & Gynecology

## 2020-08-17 ENCOUNTER — Other Ambulatory Visit: Payer: Self-pay

## 2020-08-17 ENCOUNTER — Encounter (INDEPENDENT_AMBULATORY_CARE_PROVIDER_SITE_OTHER): Payer: Self-pay | Admitting: Gastroenterology

## 2020-08-17 ENCOUNTER — Ambulatory Visit (INDEPENDENT_AMBULATORY_CARE_PROVIDER_SITE_OTHER): Payer: Medicare Other | Admitting: Gastroenterology

## 2020-08-17 VITALS — BP 132/77 | HR 84 | Temp 98.2°F | Ht 69.0 in | Wt 335.4 lb

## 2020-08-17 DIAGNOSIS — R197 Diarrhea, unspecified: Secondary | ICD-10-CM | POA: Diagnosis not present

## 2020-08-17 MED ORDER — DICYCLOMINE HCL 10 MG PO CAPS: 10.0000 mg | ORAL_CAPSULE | Freq: Two times a day (BID) | ORAL | 3 refills | Status: DC

## 2020-08-17 MED ORDER — ESTRADIOL 2 MG PO TABS
2.0000 mg | ORAL_TABLET | Freq: Every day | ORAL | 3 refills | Status: DC
Start: 2020-08-17 — End: 2020-11-25

## 2020-08-17 NOTE — Patient Instructions (Signed)
We are checking labs and stool studies for evaluation.  We will contact you with results.  I sent medication to your pharmacy to try in the meantime.

## 2020-08-17 NOTE — Progress Notes (Addendum)
Patient profile: Erin Sexton is a 49 y.o. female seen for evaluation of diarrhea.   History of Present Illness: Erin Sexton is seen today for evaluation of diarrhea - reports began about a year ago, was occurring last year at time of colonoscopy but may be more severe now. She reports having at least 4 BM most days, frequently very urgent w/ loose stools. Denies any alternating constipation. Often has urgency post pradial and unable to finish meal or has issues w/ incontinence due to urgency. Has some mild lower abd discomfort w/ the diarrhea. No new meds when symptoms began. She has been on metrformin many years, started jardiance just few weeks ago after diarrhea began. Has not tried imodium OTC but does take pepto for diarrhea. Tried eliminating dairy w/o improvement. Doesn't drink a lot of caffeine. Diarrhea seems fairly constant and not food related.   Patient denies nausea, vomiting, GERD, dysphagia, epigastric pain and weight loss.   Non smoker. No alcohol. Rare nsaids.  Wt Readings from Last 3 Encounters:  08/17/20 (!) 335 lb 6.4 oz (152.1 kg)  08/10/20 (!) 320 lb 9.6 oz (145.4 kg)  07/25/20 (!) 334 lb 12.8 oz (151.9 kg)     Last Colonoscopy: -09/2019 - Screening - One 5 mm polyp in the distal sigmoid colon, removed with a hot snare. Resected and retrieved. - One small polyp in the distal sigmoid colon, removed with a cold snare. Resected and Retrieved. Path - hyperplastic. 10 year repeat.     Past Medical History:  Past Medical History:  Diagnosis Date  . Abnormal CT scan    a. 02/2014: evaluated for dyspnea and initially placed on Xarelto for PE because radiologist could not exclude small peripheral pulmonary emboli because of limited contrast. However, after admission for ++vaginal bleeding several days later, it was felt she did NOT have PE - underwent further testing with neg VQ 03/06/14, neg LE duplex.  . Anemia   . Anxiety   . Asthma   . Blood transfusion  without reported diagnosis   . Bone spur of right foot   . CHF (congestive heart failure) ([place])   . COPD (chronic obstructive pulmonary disease) ([place])   . Depression   . Diabetes mellitus without complication ([place])    a. Dx 02/2014.  [name] History of echocardiogram 2015   normal EF, Grade II diastolic dysfunction  . HOCM (hypertrophic obstructive cardiomyopathy) ([place])    a. Dx 90240.  [name] Hypertension   . Morbid obesity ([place])   . NSVT (nonsustained ventricular tachycardia) ([place])    a. Noted on tele during 02/2014 adm for symptomatic anemia.  [name] SVT (supraventricular tachycardia) ([place])    a. Noted on tele during 02/2014 adm for symptomatic anemia.  . Vaginal bleeding     Problem List: Patient Active Problem List   Diagnosis Date Noted  . Vitamin D deficiency disease 07/25/2020  . Special screening for malignant neoplasms, colon 07/07/2019  . Facial droop 06/20/2016  . TIA (transient ischemic attack) 06/20/2016  . Chronic diastolic CHF (congestive heart failure) ([place]) 06/20/2016  . Complex endometrial hyperplasia without atypia 06/08/2014  . Menorrhagia 06/01/2014  . Physical deconditioning 05/25/2014  . Hypokalemia 05/20/2014  . Acute on chronic diastolic congestive heart failure ([place]) 05/18/2014  . Diabetes mellitus type 2, noninsulin dependent ([place])   . OSA (obstructive sleep apnea) 03/21/2014  . Acute blood loss anemia 03/06/2014  . HOCM (hypertrophic obstructive cardiomyopathy) ([place]) 03/06/2014  . Morbid obesity ([place]) 03/02/2014  . Essential  hypertension, benign 03/02/2014  . Asthma, chronic 03/02/2014    Past Surgical History: Past Surgical History:  Procedure Laterality Date  . COLONOSCOPY N/A 09/30/2019   Procedure: COLONOSCOPY;  Surgeon: Rogene Houston, MD;  Location: [facility]ITE;  Service: Endoscopy;  Laterality: N/A;  730  . DILITATION & CURRETTAGE/HYSTROSCOPY WITH NOVASURE ABLATION N/A 08/07/2016   Procedure: DILATATION & CURETTAGE/HYSTEROSCOPY WITH ATTPEMPTED  NOVASURE ABLATION AND INSERTION OF IUD;  Surgeon: Florian Buff, MD;  Location: [facility] ORS;  Service: Gynecology;  Laterality: N/A;    Allergies: No Known Allergies    Home Medications:  Current Outpatient Medications:  .  ACCU-CHEK GUIDE test strip, USE 1 STRIP TO CHECK GLUCOSE ONCE DAILY, Disp: 100 each, Rfl: 0 .  Accu-Chek Softclix Lancets lancets, USE 1 LANCET TO CHECK GLUCOSE ONCE DAILY, Disp: 100 each, Rfl: 0 .  albuterol (PROAIR HFA) 108 (90 Base) MCG/ACT inhaler, Inhale 2 puffs into the lungs as needed (wheezing). , Disp: , Rfl:  .  albuterol (PROVENTIL) (5 MG/ML) 0.5% nebulizer solution, Take 0.5 mLs (2.5 mg total) by nebulization every 6 (six) hours as needed for wheezing or shortness of breath., Disp: 20 mL, Rfl: 0 .  atorvastatin (LIPITOR) 80 MG tablet, Take 1 tablet (80 mg total) by mouth daily., Disp: 90 tablet, Rfl: 0 .  blood glucose meter kit and supplies KIT, Dispense based on patient and insurance preference. Use once a day. Diagnosis code:E11.9, Disp: 1 each, Rfl: 0 .  buPROPion (WELLBUTRIN XL) 300 MG 24 hr tablet, Take 1 tablet (300 mg total) by mouth daily., Disp: 90 tablet, Rfl: 0 .  cetirizine (ZYRTEC ALLERGY) 10 MG tablet, Take 1 tablet (10 mg total) by mouth daily., Disp: 30 tablet, Rfl: 0 .  Cholecalciferol (VITAMIN D) 125 MCG (5000 UT) CAPS, Take 10,000 Units by mouth daily., Disp: 60 capsule, Rfl: 0 .  clotrimazole-betamethasone (LOTRISONE) cream, Apply topically 2 (two) times daily., Disp: 15 g, Rfl: 0 .  empagliflozin (JARDIANCE) 10 MG TABS tablet, Take 1 tablet (10 mg total) by mouth daily., Disp: 30 tablet, Rfl: 3 .  estradiol (ESTRACE) 2 MG tablet, Take 1 tablet (2 mg total) by mouth at bedtime., Disp: 90 tablet, Rfl: 3 .  glipiZIDE (GLUCOTROL) 5 MG tablet, Take 1 tablet (5 mg total) by mouth 2 (two) times daily before a meal., Disp: 180 tablet, Rfl: 0 .  Lancet Device MISC, Use to check your blood sugar daily, Disp: 100 each, Rfl: 5 .  lisinopril (ZESTRIL) 2.5 MG  tablet, Take 1 tablet (2.5 mg total) by mouth daily., Disp: 90 tablet, Rfl: 0 .  megestrol (MEGACE) 40 MG tablet, 1 tablet daily, Disp: 30 tablet, Rfl: 11 .  metFORMIN (GLUCOPHAGE) 500 MG tablet, Take 1 tablet (500 mg total) by mouth 2 (two) times daily., Disp: 180 tablet, Rfl: 0 .  metoprolol tartrate (LOPRESSOR) 50 MG tablet, Take 1 tablet (50 mg total) by mouth 2 (two) times daily., Disp: 180 tablet, Rfl: 0 .  NP THYROID 30 MG tablet, Take 1 tablet (30 mg total) by mouth daily before breakfast., Disp: 30 tablet, Rfl: 3 .  potassium chloride SA (KLOR-CON) 20 MEQ tablet, Take 1 tablet (20 mEq total) by mouth 2 (two) times daily., Disp: 180 tablet, Rfl: 0 .  SYMBICORT 160-4.5 MCG/ACT inhaler, Inhale 2 puffs into the lungs daily. , Disp: , Rfl:  .  thyroid (NP THYROID) 60 MG tablet, Take 2 tablets (120 mg total) by mouth daily before breakfast., Disp: 60 tablet, Rfl: 3 .  torsemide (DEMADEX) 20 MG tablet, Take 1 tablet (20 mg total) by mouth 2 (two) times daily., Disp: 180 tablet, Rfl: 0 .  dicyclomine (BENTYL) 10 MG capsule, Take 1 capsule (10 mg total) by mouth 2 (two) times daily before a meal., Disp: 60 capsule, Rfl: 3   Family History: family history includes Arthritis in her mother; Bipolar disorder in her sister; Diabetes in her mother; Heart disease in her father, maternal grandfather, and sister; Mental illness in her maternal grandmother, mother, and sister; Other in her mother; Uterine cancer in her maternal grandmother.    Social History:   reports that she has never smoked. She has never used smokeless tobacco. She reports that she does not drink alcohol and does not use drugs.   Review of Systems: Constitutional: Denies weight loss/weight gain  Eyes: No changes in vision. ENT: No oral lesions, sore throat.  GI: see HPI.  Heme/Lymph: No easy bruising.  CV: No chest pain.  GU: No hematuria.  Integumentary: No rashes.  Neuro: No headaches.  Psych: No depression/anxiety.    Endocrine: No heat/cold intolerance.  Allergic/Immunologic: No urticaria.  Resp: No cough, SOB.  Musculoskeletal: No joint swelling.    Physical Examination: BP 132/77 (BP Location: Right Arm, Patient Position: Sitting, Cuff Size: Large)   Pulse 84   Temp 98.2 F (36.8 C) (Oral)   Ht $R'5\' 9"'xX$  (1.753 m)   Wt (!) 335 lb 6.4 oz (152.1 kg)   LMP 06/04/2014   BMI 49.53 kg/m  Gen: NAD, alert and oriented x 4 HEENT: PEERLA, EOMI, Neck: supple, no JVD Chest: CTA bilaterally, no wheezes, crackles, or other adventitious sounds CV: RRR, no m/g/c/r Abd: soft, NT, ND, +BS in all four quadrants; no HSM, guarding, ridigity, or rebound tenderness Ext: no edema, well perfused with 2+ pulses, Skin: no rash or lesions noted on observed skin Lymph: no noted LAD  Data Reviewed:   08/2019 - CMP w/ glucose 178, Cr 1.2, GFR 52, Ca 8.4, Vit D normal. T4 normal. T3 4.9. TSH normal.   Assessment/Plan: Erin Sexton is a 49 y.o. female seen for diarrhea.   1. Diarrhea - ongoing x 1 year, no alternating constipation. Was ongoing at time of screening colonoscopy but given no report of symptoms random bx not taken to exclude microscopic colitis. Will check stool studies and fecal elastase for evaluation. Will empirically start treatment for IBS w/ dicyclomine. Further recs pending. Diet modifications reviewed.    Edda was seen today for diarrhea.  Diagnoses and all orders for this visit:  Diarrhea, unspecified type -     Pancreatic elastase, fecal -     Celiac Disease Panel -     C. difficile GDH and Toxin A/B -     Gastrointestinal Pathogen Panel PCR  Other orders -     dicyclomine (BENTYL) 10 MG capsule; Take 1 capsule (10 mg total) by mouth 2 (two) times daily before a meal.       I personally performed the service, non-incident to. (WP)  Laurine Blazer, [facility]

## 2020-08-18 DIAGNOSIS — R197 Diarrhea, unspecified: Secondary | ICD-10-CM | POA: Diagnosis not present

## 2020-08-18 LAB — CELIAC DISEASE PANEL
(tTG) Ab, IgA: 1 U/mL
(tTG) Ab, IgG: 1 U/mL
Gliadin IgA: 8 Units
Gliadin IgG: 2 Units
Immunoglobulin A: 200 mg/dL (ref 47–310)

## 2020-08-22 LAB — GASTROINTESTINAL PATHOGEN PANEL PCR
C. difficile Tox A/B, PCR: NOT DETECTED
Campylobacter, PCR: NOT DETECTED
Cryptosporidium, PCR: NOT DETECTED
E coli (ETEC) LT/ST PCR: NOT DETECTED
E coli (STEC) stx1/stx2, PCR: NOT DETECTED
E coli 0157, PCR: NOT DETECTED
Giardia lamblia, PCR: NOT DETECTED
Norovirus, PCR: NOT DETECTED
Rotavirus A, PCR: NOT DETECTED
Salmonella, PCR: NOT DETECTED
Shigella, PCR: NOT DETECTED

## 2020-08-22 LAB — C. DIFFICILE GDH AND TOXIN A/B
GDH ANTIGEN: NOT DETECTED
MICRO NUMBER:: 10936589
SPECIMEN QUALITY:: ADEQUATE
TOXIN A AND B: NOT DETECTED

## 2020-08-22 LAB — PANCREATIC ELASTASE, FECAL: Pancreatic Elastase-1, Stool: >500 mcg/g

## 2020-09-19 ENCOUNTER — Other Ambulatory Visit (INDEPENDENT_AMBULATORY_CARE_PROVIDER_SITE_OTHER): Payer: Self-pay | Admitting: Nurse Practitioner

## 2020-09-19 DIAGNOSIS — I10 Essential (primary) hypertension: Secondary | ICD-10-CM

## 2020-09-19 DIAGNOSIS — I5032 Chronic diastolic (congestive) heart failure: Secondary | ICD-10-CM

## 2020-09-19 DIAGNOSIS — J4540 Moderate persistent asthma, uncomplicated: Secondary | ICD-10-CM

## 2020-09-19 DIAGNOSIS — E119 Type 2 diabetes mellitus without complications: Secondary | ICD-10-CM

## 2020-09-19 DIAGNOSIS — E559 Vitamin D deficiency, unspecified: Secondary | ICD-10-CM

## 2020-09-19 DIAGNOSIS — E785 Hyperlipidemia, unspecified: Secondary | ICD-10-CM

## 2020-09-27 ENCOUNTER — Other Ambulatory Visit: Payer: Self-pay

## 2020-09-27 ENCOUNTER — Ambulatory Visit (INDEPENDENT_AMBULATORY_CARE_PROVIDER_SITE_OTHER): Payer: Medicare Other | Admitting: Internal Medicine

## 2020-09-27 ENCOUNTER — Encounter (INDEPENDENT_AMBULATORY_CARE_PROVIDER_SITE_OTHER): Payer: Self-pay | Admitting: Internal Medicine

## 2020-09-27 VITALS — BP 122/82 | HR 80 | Temp 97.5°F | Ht 69.0 in | Wt 317.0 lb

## 2020-09-27 DIAGNOSIS — I10 Essential (primary) hypertension: Secondary | ICD-10-CM

## 2020-09-27 DIAGNOSIS — E669 Obesity, unspecified: Secondary | ICD-10-CM

## 2020-09-27 DIAGNOSIS — E0965 Drug or chemical induced diabetes mellitus with hyperglycemia: Secondary | ICD-10-CM | POA: Diagnosis not present

## 2020-09-27 DIAGNOSIS — E1169 Type 2 diabetes mellitus with other specified complication: Secondary | ICD-10-CM

## 2020-09-27 NOTE — Progress Notes (Signed)
Metrics: Intervention Frequency ACO  Documented Smoking Status Yearly  Screened one or more times in 24 months  Cessation Counseling or  Active cessation medication Past 24 months  Past 24 months   Guideline developer: UpToDate (See UpToDate for funding source) Date Released: 2014       Wellness Office Visit  Subjective:  Patient ID: Erin Sexton, female    DOB: 1971-03-26  Age: 49 y.o. MRN: [id number]  CC: This lady comes in for follow-up of morbid obesity, diabetes, thyroid hypofunction. HPI  Since being started on Jardiance, she feels that she has improved and also she has been doing intermittent fasting almost on a daily basis which is helped her lose weight. She continues with desiccated NP thyroid with a total dose of 150 mg daily.  She is tolerating this well. Past Medical History:  Diagnosis Date  . Abnormal CT scan    a. 02/2014: evaluated for dyspnea and initially placed on Xarelto for PE because radiologist could not exclude small peripheral pulmonary emboli because of limited contrast. However, after admission for ++vaginal bleeding several days later, it was felt she did NOT have PE - underwent further testing with neg VQ 03/06/14, neg LE duplex.  . Anemia   . Anxiety   . Asthma   . Blood transfusion without reported diagnosis   . Bone spur of right foot   . CHF (congestive heart failure) ([place])   . COPD (chronic obstructive pulmonary disease) ([place])   . Depression   . Diabetes mellitus without complication ([place])    a. Dx 02/2014.  [name] History of echocardiogram 2015   normal EF, Grade II diastolic dysfunction  . HOCM (hypertrophic obstructive cardiomyopathy) ([place])    a. Dx 89211.  [name] Hypertension   . Morbid obesity ([place])   . NSVT (nonsustained ventricular tachycardia) ([place])    a. Noted on tele during 02/2014 adm for symptomatic anemia.  [name] SVT (supraventricular tachycardia) ([place])    a. Noted on tele during 02/2014 adm for symptomatic anemia.  . Vaginal bleeding     Past Surgical History:  Procedure Laterality Date  . COLONOSCOPY N/A 09/30/2019   Procedure: COLONOSCOPY;  Surgeon: Rogene Houston, MD;  Location: [facility];  Service: Endoscopy;  Laterality: N/A;  730  . DILITATION & CURRETTAGE/HYSTROSCOPY WITH NOVASURE ABLATION N/A 08/07/2016   Procedure: DILATATION & CURETTAGE/HYSTEROSCOPY WITH ATTPEMPTED NOVASURE ABLATION AND INSERTION OF IUD;  Surgeon: Florian Buff, MD;  Location: [facility] ORS;  Service: Gynecology;  Laterality: N/A;     Family History  Problem Relation Age of Onset  . Diabetes Mother   . Arthritis Mother   . Other Mother        WPW  . Mental illness Mother        Depression  . Heart disease Father        History of coronary bypass grafting at age 6  . Mental illness Sister   . Bipolar disorder Sister   . Heart disease Sister        possibly WPW  . Uterine cancer Maternal Grandmother   . Mental illness Maternal Grandmother   . Heart disease Maternal Grandfather   . CAD Neg Hx        No family history of EARLY CAD    Social History   Social History Narrative   Divorced for 3 years,married for 5 years.On disability since 2009 secondary to mental illness.   Social History   Tobacco Use  . Smoking status: Never  Smoker  . Smokeless tobacco: Never Used  Substance Use Topics  . Alcohol use: No    Current Meds  Medication Sig  . ACCU-CHEK GUIDE test strip USE 1 STRIP TO CHECK GLUCOSE ONCE DAILY  . Accu-Chek Softclix Lancets lancets USE 1 LANCET TO CHECK GLUCOSE ONCE DAILY  . albuterol (PROAIR HFA) 108 (90 Base) MCG/ACT inhaler Inhale 2 puffs into the lungs as needed (wheezing).   [name] albuterol (PROVENTIL) (5 MG/ML) 0.5% nebulizer solution Take 0.5 mLs (2.5 mg total) by nebulization every 6 (six) hours as needed for wheezing or shortness of breath.  [name] atorvastatin (LIPITOR) 80 MG tablet Take 1 tablet (80 mg total) by mouth daily.  . blood glucose meter kit and supplies KIT Dispense based on patient and insurance  preference. Use once a day. Diagnosis code:E11.9  . buPROPion (WELLBUTRIN XL) 300 MG 24 hr tablet Take 1 tablet (300 mg total) by mouth daily.  . cetirizine (ZYRTEC ALLERGY) 10 MG tablet Take 1 tablet (10 mg total) by mouth daily.  . Cholecalciferol (VITAMIN D) 125 MCG (5000 UT) CAPS Take 10,000 Units by mouth daily.  . clindamycin (CLEOCIN T) 1 % lotion APPLY TO AFFECTED AREAS OF BOILS TWICE DAILY AS NEEDED  . clotrimazole-betamethasone (LOTRISONE) cream APPLY CREAM TOPICALLY TWICE DAILY  . dicyclomine (BENTYL) 10 MG capsule Take 1 capsule (10 mg total) by mouth 2 (two) times daily before a meal.  . empagliflozin (JARDIANCE) 10 MG TABS tablet Take 1 tablet (10 mg total) by mouth daily.  [name] estradiol (ESTRACE) 2 MG tablet Take 1 tablet (2 mg total) by mouth at bedtime.  [name] glipiZIDE (GLUCOTROL) 5 MG tablet Take 1 tablet (5 mg total) by mouth 2 (two) times daily before a meal.  . Lancet Device MISC Use to check your blood sugar daily  . lisinopril (ZESTRIL) 2.5 MG tablet Take 1 tablet (2.5 mg total) by mouth daily.  . megestrol (MEGACE) 40 MG tablet 1 tablet daily  . metFORMIN (GLUCOPHAGE) 500 MG tablet Take 1 tablet by mouth twice daily  . metoprolol tartrate (LOPRESSOR) 50 MG tablet Take 1 tablet (50 mg total) by mouth 2 (two) times daily.  . NP THYROID 30 MG tablet Take 1 tablet (30 mg total) by mouth daily before breakfast.  . potassium chloride SA (KLOR-CON) 20 MEQ tablet Take 1 tablet (20 mEq total) by mouth 2 (two) times daily.  . SYMBICORT 160-4.5 MCG/ACT inhaler Inhale 2 puffs into the lungs daily.   [name] thyroid (NP THYROID) 60 MG tablet Take 2 tablets (120 mg total) by mouth daily before breakfast.  . torsemide (DEMADEX) 20 MG tablet Take 1 tablet by mouth twice daily      Depression screen [facility] 2/9 12/29/2019  Decreased Interest 0  Down, Depressed, Hopeless 0  PHQ - 2 Score 0     Objective:   Today's Vitals: BP 122/82   Pulse 80   Temp (!) 97.5 F (36.4 C) (Temporal)   Ht 5'  9" (1.753 m)   Wt (!) 317 lb (143.8 kg)   LMP 06/04/2014   SpO2 95%   BMI 46.81 kg/m  Vitals with BMI 09/27/2020 08/17/2020 08/10/2020  Height $Remov'5\' 9"'zwYroP$  $Remove'5\' 9"'AntTZuk$  $RemoveB'5\' 9"'RAQEzIQQ$   Weight 317 lbs 335 lbs 6 oz 320 lbs 10 oz  BMI 46.79 62.83 15.17  Systolic 616 073 710  Diastolic 82 77 80  Pulse 80 84 79  Some encounter information is confidential and restricted. Go to Review Flowsheets activity to see all data.  Physical Exam  Although she remains morbidly obese, she has lost about 18 pounds in the last 6 weeks or so.     Assessment   1. Essential hypertension, benign   2. Morbid obesity ([place])   3. Diabetes mellitus type 2 in obese ([place])   4. 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 COMPLETE METABOLIC PANEL WITH GFR  . Hemoglobin A1c  . T3, free  . TSH     Plan: 1. She will continue with lisinopril, metoprolol for her hypertension which is keeping her blood pressure under reasonable control. 2. She will continue with Metformin, glipizide and Jardiance for her diabetes although I would like to eliminate glipizide eventually. 3. She will continue with current dose of desiccated NP thyroid and we will check levels today to see if we need to adjust the dose. 4. We discussed the importance of COVID-19 vaccination which she has not had yet.  She did have COVID-19 disease in December 2020.  I have urged her to get vaccinated. 5. Follow-up in 3 months time.   No orders of the defined types were placed in this encounter.   Doree Albee, MD

## 2020-09-28 ENCOUNTER — Other Ambulatory Visit (INDEPENDENT_AMBULATORY_CARE_PROVIDER_SITE_OTHER): Payer: Self-pay | Admitting: Nurse Practitioner

## 2020-09-28 DIAGNOSIS — E119 Type 2 diabetes mellitus without complications: Secondary | ICD-10-CM

## 2020-09-28 DIAGNOSIS — I5032 Chronic diastolic (congestive) heart failure: Secondary | ICD-10-CM

## 2020-09-28 DIAGNOSIS — E559 Vitamin D deficiency, unspecified: Secondary | ICD-10-CM

## 2020-09-28 DIAGNOSIS — I10 Essential (primary) hypertension: Secondary | ICD-10-CM

## 2020-09-28 DIAGNOSIS — E785 Hyperlipidemia, unspecified: Secondary | ICD-10-CM

## 2020-09-28 DIAGNOSIS — J4540 Moderate persistent asthma, uncomplicated: Secondary | ICD-10-CM

## 2020-09-28 LAB — SPECIMEN COMPROMISED

## 2020-09-28 LAB — COMPLETE METABOLIC PANEL WITH GFR
AG Ratio: 1.6 (calc) (ref 1.0–2.5)
ALT: 22 U/L (ref 6–29)
AST: 17 U/L (ref 10–35)
Albumin: 4.2 g/dL (ref 3.6–5.1)
Alkaline phosphatase (APISO): 112 U/L (ref 37–153)
BUN/Creatinine Ratio: 14 (calc) (ref 6–22)
BUN: 18 mg/dL (ref 7–25)
CO2: 27 mmol/L (ref 20–32)
Calcium: 8.5 mg/dL — ABNORMAL LOW (ref 8.6–10.4)
Chloride: 100 mmol/L (ref 98–110)
Creat: 1.25 mg/dL — ABNORMAL HIGH (ref 0.50–1.05)
GFR, Est African American: 58 mL/min/{1.73_m2} — ABNORMAL LOW (ref >=60)
GFR, Est Non African American: 50 mL/min/{1.73_m2} — ABNORMAL LOW (ref >=60)
Globulin: 2.7 g/dL (calc) (ref 1.9–3.7)
Glucose, Bld: 95 mg/dL (ref 65–99)
Potassium: 3.9 mmol/L (ref 3.5–5.3)
Sodium: 144 mmol/L (ref 135–146)
Total Bilirubin: 0.9 mg/dL (ref 0.2–1.2)
Total Protein: 6.9 g/dL (ref 6.1–8.1)

## 2020-09-28 LAB — TSH: TSH: 1.37 mIU/L

## 2020-09-28 LAB — HEMOGLOBIN A1C
Hgb A1c MFr Bld: 9.7 % of total Hgb — ABNORMAL HIGH (ref <5.7)
Mean Plasma Glucose: 232 (calc)
eAG (mmol/L): 12.8 (calc)

## 2020-09-28 LAB — T3, FREE: T3, Free: 5.8 pg/mL — ABNORMAL HIGH (ref 2.3–4.2)

## 2020-10-03 ENCOUNTER — Encounter (INDEPENDENT_AMBULATORY_CARE_PROVIDER_SITE_OTHER): Payer: Self-pay | Admitting: Internal Medicine

## 2020-10-04 ENCOUNTER — Other Ambulatory Visit (INDEPENDENT_AMBULATORY_CARE_PROVIDER_SITE_OTHER): Payer: Self-pay | Admitting: Internal Medicine

## 2020-10-04 DIAGNOSIS — J45909 Unspecified asthma, uncomplicated: Secondary | ICD-10-CM

## 2020-10-05 ENCOUNTER — Other Ambulatory Visit (INDEPENDENT_AMBULATORY_CARE_PROVIDER_SITE_OTHER): Payer: Self-pay | Admitting: Nurse Practitioner

## 2020-10-05 DIAGNOSIS — J45909 Unspecified asthma, uncomplicated: Secondary | ICD-10-CM

## 2020-10-07 ENCOUNTER — Other Ambulatory Visit (INDEPENDENT_AMBULATORY_CARE_PROVIDER_SITE_OTHER): Payer: Self-pay | Admitting: Nurse Practitioner

## 2020-10-07 DIAGNOSIS — J4540 Moderate persistent asthma, uncomplicated: Secondary | ICD-10-CM

## 2020-10-07 DIAGNOSIS — E119 Type 2 diabetes mellitus without complications: Secondary | ICD-10-CM

## 2020-10-07 DIAGNOSIS — E559 Vitamin D deficiency, unspecified: Secondary | ICD-10-CM

## 2020-10-07 DIAGNOSIS — E785 Hyperlipidemia, unspecified: Secondary | ICD-10-CM

## 2020-10-07 DIAGNOSIS — I10 Essential (primary) hypertension: Secondary | ICD-10-CM

## 2020-10-07 DIAGNOSIS — I5032 Chronic diastolic (congestive) heart failure: Secondary | ICD-10-CM

## 2020-10-16 ENCOUNTER — Other Ambulatory Visit (INDEPENDENT_AMBULATORY_CARE_PROVIDER_SITE_OTHER): Payer: Self-pay | Admitting: Internal Medicine

## 2020-10-24 ENCOUNTER — Other Ambulatory Visit: Payer: Self-pay

## 2020-10-24 ENCOUNTER — Encounter: Payer: Self-pay | Admitting: Cardiology

## 2020-10-24 ENCOUNTER — Ambulatory Visit (INDEPENDENT_AMBULATORY_CARE_PROVIDER_SITE_OTHER): Payer: Medicare Other | Admitting: Cardiology

## 2020-10-24 ENCOUNTER — Ambulatory Visit: Payer: Medicare Other | Admitting: Cardiology

## 2020-10-24 VITALS — BP 124/84 | HR 88 | Ht 69.0 in | Wt 330.0 lb

## 2020-10-24 DIAGNOSIS — Z8669 Personal history of other diseases of the nervous system and sense organs: Secondary | ICD-10-CM

## 2020-10-24 DIAGNOSIS — I10 Essential (primary) hypertension: Secondary | ICD-10-CM | POA: Diagnosis not present

## 2020-10-24 DIAGNOSIS — I5032 Chronic diastolic (congestive) heart failure: Secondary | ICD-10-CM | POA: Diagnosis not present

## 2020-10-24 DIAGNOSIS — E119 Type 2 diabetes mellitus without complications: Secondary | ICD-10-CM | POA: Diagnosis not present

## 2020-10-24 DIAGNOSIS — N182 Chronic kidney disease, stage 2 (mild): Secondary | ICD-10-CM

## 2020-10-24 DIAGNOSIS — I421 Obstructive hypertrophic cardiomyopathy: Secondary | ICD-10-CM

## 2020-10-24 DIAGNOSIS — G4733 Obstructive sleep apnea (adult) (pediatric): Secondary | ICD-10-CM | POA: Diagnosis not present

## 2020-10-24 NOTE — Assessment & Plan Note (Signed)
No CHF on exam and no history of recent CHF.

## 2020-10-24 NOTE — Progress Notes (Signed)
Cardiology Office Note:    Date:  10/24/2020   ID:  Erin Sexton, DOB 03-Jan-1971, MRN [id number]  PCP:  Doree Albee, MD  Cardiologist:  Linna Hoff Electrophysiologist:  None   Referring MD: Doree Albee, MD   No chief complaint on file.   History of Present Illness:    Erin Sexton is a pleasant 49 y.o. female with a hx of hypertension, non-insulin-dependent diabetes, morbid obesity with a BMI of 48, chronic diastolic dysfunction, sleep apnea, chronic renal insufficiency stage II, treated dyslipidemia, and a past history of HOCM by echocardiogram in 2017.  The patient is in the office today for yearly follow-up.  Since she was last seen she has done well from a cardiac standpoint.  She is managed to lose about 20 pounds over the past year.  She walks for exercise.  She denies any exertional chest pain.  She is not had syncope or near syncope.  She denies any unusual dyspnea.  She has been diagnosed with sleep apnea in 2015.  I do not have the actual report but she was told it was quite severe.  She was unable to tolerate a full facemask.  She sleeps in a recliner, if she lays flat she cannot breathe.  Even sleeping in a recliner she feels like she does not sleep well.  Past Medical History:  Diagnosis Date  . Abnormal CT scan    a. 02/2014: evaluated for dyspnea and initially placed on Xarelto for PE because radiologist could not exclude small peripheral pulmonary emboli because of limited contrast. However, after admission for ++vaginal bleeding several days later, it was felt she did NOT have PE - underwent further testing with neg VQ 03/06/14, neg LE duplex.  . Anemia   . Anxiety   . Asthma   . Blood transfusion without reported diagnosis   . Bone spur of right foot   . CHF (congestive heart failure) ([place])   . COPD (chronic obstructive pulmonary disease) ([place])   . Depression   . Diabetes mellitus without complication ([place])    a. Dx 02/2014.  [name] History of  echocardiogram 2015   normal EF, Grade II diastolic dysfunction  . HOCM (hypertrophic obstructive cardiomyopathy) ([place])    a. Dx 93235.  [name] Hypertension   . Morbid obesity ([place])   . NSVT (nonsustained ventricular tachycardia) ([place])    a. Noted on tele during 02/2014 adm for symptomatic anemia.  [name] SVT (supraventricular tachycardia) ([place])    a. Noted on tele during 02/2014 adm for symptomatic anemia.  . Vaginal bleeding     Past Surgical History:  Procedure Laterality Date  . COLONOSCOPY N/A 09/30/2019   Procedure: COLONOSCOPY;  Surgeon: Rogene Houston, MD;  [facility];  Service: Endoscopy;  Laterality: N/A;  730  . DILITATION & CURRETTAGE/HYSTROSCOPY WITH NOVASURE ABLATION N/A 08/07/2016   Procedure: DILATATION & CURETTAGE/HYSTEROSCOPY WITH ATTPEMPTED NOVASURE ABLATION AND INSERTION OF IUD;  Surgeon: Florian Buff, MD;  Location: [facility]S;  Service: Gynecology;  Laterality: N/A;    Current Medications: Current Meds  Medication Sig  . ACCU-CHEK GUIDE test strip USE 1 STRIP TO CHECK GLUCOSE ONCE DAILY  . Accu-Chek Softclix Lancets lancets USE 1 LANCET TO CHECK GLUCOSE ONCE DAILY  . albuterol (PROAIR HFA) 108 (90 Base) MCG/ACT inhaler Inhale 2 puffs into the lungs as needed (wheezing).   [name] albuterol (PROVENTIL) (5 MG/ML) 0.5% nebulizer solution Take 0.5 mLs (2.5 mg total) by nebulization every 6 (six) hours as  needed for wheezing or shortness of breath.  [name] atorvastatin (LIPITOR) 80 MG tablet Take 1 tablet (80 mg total) by mouth daily.  . blood glucose meter kit and supplies KIT Dispense based on patient and insurance preference. Use once a day. Diagnosis code:E11.9  . buPROPion (WELLBUTRIN XL) 300 MG 24 hr tablet Take 1 tablet (300 mg total) by mouth daily.  . cetirizine (ZYRTEC ALLERGY) 10 MG tablet Take 1 tablet (10 mg total) by mouth daily.  . Cholecalciferol (VITAMIN D) 125 MCG (5000 UT) CAPS Take 10,000 Units by mouth daily.  . clindamycin (CLEOCIN T) 1 % lotion APPLY TO  AFFECTED AREAS OF BOILS TWICE DAILY AS NEEDED  . clotrimazole-betamethasone (LOTRISONE) cream APPLY CREAM TOPICALLY TWICE DAILY  . dicyclomine (BENTYL) 10 MG capsule Take 1 capsule (10 mg total) by mouth 2 (two) times daily before a meal.  . empagliflozin (JARDIANCE) 10 MG TABS tablet Take 1 tablet (10 mg total) by mouth daily.  [name] estradiol (ESTRACE) 2 MG tablet Take 1 tablet (2 mg total) by mouth at bedtime.  [name] glipiZIDE (GLUCOTROL) 5 MG tablet TAKE 1 TABLET BY MOUTH TWICE DAILY BEFORE A MEAL  . Lancet Device MISC Use to check your blood sugar daily  . lisinopril (ZESTRIL) 2.5 MG tablet Take 1 tablet by mouth once daily  . megestrol (MEGACE) 40 MG tablet 1 tablet daily  . metFORMIN (GLUCOPHAGE) 500 MG tablet Take 1 tablet by mouth twice daily  . metoprolol tartrate (LOPRESSOR) 50 MG tablet Take 1 tablet by mouth twice daily  . NP THYROID 30 MG tablet TAKE 1 TABLET(30 MG) BY MOUTH DAILY BEFORE BREAKFAST  . potassium chloride SA (KLOR-CON) 20 MEQ tablet Take 1 tablet by mouth twice daily  . SYMBICORT 160-4.5 MCG/ACT inhaler Inhale 2 puffs into the lungs daily.   [name] thyroid (NP THYROID) 60 MG tablet Take 2 tablets (120 mg total) by mouth daily before breakfast.  . torsemide (DEMADEX) 20 MG tablet Take 1 tablet by mouth twice daily     Allergies:   Patient has no known allergies.   Social History   Socioeconomic History  . Marital status: Divorced    Spouse name: Not on file  . Number of children: 0  . Years of education: Not on file  . Highest education level: Not on file  Occupational History  . Not on file  Tobacco Use  . Smoking status: Never Smoker  . Smokeless tobacco: Never Used  Vaping Use  . Vaping Use: Never used  Substance and Sexual Activity  . Alcohol use: No  . Drug use: No  . Sexual activity: Not Currently    Birth control/protection: I.U.D.  Other Topics Concern  . Not on file  Social History Narrative   Divorced for 3 years,married for 5 years.On disability  since 2009 secondary to mental illness.   Social Determinants of Health   Financial Resource Strain:   . Difficulty of Paying Living Expenses: Not on file  Food Insecurity:   . Worried About Charity fundraiser in the Last Year: Not on file  . Ran Out of Food in the Last Year: Not on file  Transportation Needs:   . Lack of Transportation (Medical): Not on file  . Lack of Transportation (Non-Medical): Not on file  Physical Activity:   . Days of Exercise per Week: Not on file  . Minutes of Exercise per Session: Not on file  Stress:   . Feeling of Stress : Not on file  Social Connections:   . Frequency of Communication with Friends and Family: Not on file  . Frequency of Social Gatherings with Friends and Family: Not on file  . Attends Religious Services: Not on file  . Active Member of Clubs or Organizations: Not on file  . Attends Archivist Meetings: Not on file  . Marital Status: Not on file     Family History: The patient's family history includes Arthritis in her mother; Bipolar disorder in her sister; Diabetes in her mother; Heart disease in her father, maternal grandfather, and sister; Mental illness in her maternal grandmother, mother, and sister; Other in her mother; Uterine cancer in her maternal grandmother. There is no history of CAD.  ROS:   Please see the history of present illness.    All other systems reviewed and are negative.  EKGs/Labs/Other Studies Reviewed:    The following studies were reviewed today:  Echo July 2017- Study Conclusions   - Left ventricle: Turbulent flow through LVOT Some obstruction to  outflow Peak and mean gradients through the subvalvular apparatus  are 48 and 22 mm Hg respectively. Severe asymmetric LVH. The  cavity size was normal. Systolic function was vigorous. The  estimated ejection fraction was in the range of 65% to 70%.  Doppler parameters are consistent with abnormal left ventricular  relaxation  (grade 1 diastolic dysfunction).   EKG:  EKG is ordered today.  The ekg ordered today demonstrates NSR, HR 80, NSST changes, QTc 468  Recent Labs: 09/27/2020: ALT 22; BUN 18; Creat 1.25; Potassium 3.9; Sodium 144; TSH 1.37  Recent Lipid Panel    Component Value Date/Time   CHOL 81 06/27/2020 1355   TRIG 238 (H) 06/27/2020 1355   HDL 30 (L) 06/27/2020 1355   CHOLHDL 2.7 06/27/2020 1355   VLDL 18 06/21/2016 0510   LDLCALC 21 06/27/2020 1355    Physical Exam:    VS:  BP 124/84   Pulse 88   Ht $R'5\' 9"'RV$  (1.753 m)   Wt (!) 330 lb (149.7 kg)   LMP 06/04/2014   SpO2 96%   BMI 48.73 kg/m     Wt Readings from Last 3 Encounters:  10/24/20 (!) 330 lb (149.7 kg)  09/27/20 (!) 317 lb (143.8 kg)  08/17/20 (!) 335 lb 6.4 oz (152.1 kg)     GEN: Morbidly obese Caucasian female in no acute distress HEENT: Normal NECK: No JVD; No carotid bruits CARDIAC: RRR, no murmurs, rubs, gallops RESPIRATORY:  Clear to auscultation without rales, wheezing or rhonchi  ABDOMEN: obese, large panus, non-distended MUSCULOSKELETAL:  No edema; No deformity  SKIN: Warm and dry NEUROLOGIC:  Alert and oriented x 3 PSYCHIATRIC:  Normal affect   ASSESSMENT:    Essential hypertension, benign B/P controlled on current medications  Chronic diastolic CHF (congestive heart failure) (HCC) No CHF on exam and no history of recent CHF.  HOCM (hypertrophic obstructive cardiomyopathy) Noted by echo 2017 though she does not have a murmur on exam.  Since she has been asymptomatic no reason to order another echo.   OSA (obstructive sleep apnea) Untreated "severe" sleep apnea.  She would like to revisit this as she has heard there are newer options for masks.   Diabetes mellitus type 2, noninsulin dependent ([place]) On oral agents followed by PCP.  Morbid obesity (HCC) BMI 48- weight down 18 lbs from last year  CRI (chronic renal insufficiency), stage 2 (mild) GFR in 50's  PLAN:    I suggested we repeat her  sleep study.  She is agreeable to this.  We can see her in follow-up from a cardiology standpoint in a year.   Medication Adjustments/Labs and Tests Ordered: Current medicines are reviewed at length with the patient today.  Concerns regarding medicines are outlined above.  Orders Placed This Encounter  Procedures  . Home sleep test   No orders of the defined types were placed in this encounter.   Patient Instructions  Medication Instructions:  Your physician recommends that you continue on your current medications as directed. Please refer to the Current Medication list given to you today.  *If you need a refill on your cardiac medications before your next appointment, please call your pharmacy*   Lab Work: NONE   If you have labs (blood work) drawn today and your tests are completely normal, you will receive your results only by: [name] MyChart Message (if you have MyChart) OR . A paper copy in the mail If you have any lab test that is abnormal or we need to change your treatment, we will call you to review the results.   Testing/Procedures: Your physician has recommended that you have a sleep study. This test records several body functions during sleep, including: brain activity, eye movement, oxygen and carbon dioxide blood levels, heart rate and rhythm, breathing rate and rhythm, the flow of air through your mouth and nose, snoring, body muscle movements, and chest and belly movement.    Follow-Up: At [facility], you and your health needs are our priority.  As part of our continuing mission to provide you with exceptional heart care, we have created designated Provider Care Teams.  These Care Teams include your primary Cardiologist (physician) and Advanced Practice Providers (APPs -  Physician Assistants and Nurse Practitioners) who all work together to provide you with the care you need, when you need it.  We recommend signing up for the patient portal called "MyChart".  Sign  up information is provided on this After Visit Summary.  MyChart is used to connect with patients for Virtual Visits (Telemedicine).  Patients are able to view lab/test results, encounter notes, upcoming appointments, etc.  Non-urgent messages can be sent to your provider as well.   To learn more about what you can do with MyChart, go to [email or web address].    Your next appointment:   1 year(s)  The format for your next appointment:   In Person  Provider:   With MD    Other Instructions Thank you for choosing [facility]!       Signed, [name], PA-C  10/24/2020 8:33 AM     [facility]

## 2020-10-24 NOTE — Assessment & Plan Note (Signed)
On oral agents followed by PCP.

## 2020-10-24 NOTE — Addendum Note (Signed)
Addended by: Levonne Hubert on: 10/24/2020 10:20 AM   Modules accepted: Orders

## 2020-10-24 NOTE — Assessment & Plan Note (Signed)
BP controlled on current medications.  

## 2020-10-24 NOTE — Assessment & Plan Note (Signed)
Noted by echo 2017 though she does not have a murmur on exam.  Since she has been asymptomatic no reason to order another echo.

## 2020-10-24 NOTE — Assessment & Plan Note (Signed)
GFR in 50's

## 2020-10-24 NOTE — Patient Instructions (Signed)
Medication Instructions:  Your physician recommends that you continue on your current medications as directed. Please refer to the Current Medication list given to you today.  *If you need a refill on your cardiac medications before your next appointment, please call your pharmacy*   Lab Work: NONE   If you have labs (blood work) drawn today and your tests are completely normal, you will receive your results only by:  [place] (if you have MyChart) OR  A paper copy in the mail If you have any lab test that is abnormal or we need to change your treatment, we will call you to review the results.   Testing/Procedures: Your physician has recommended that you have a sleep study. This test records several body functions during sleep, including: brain activity, eye movement, oxygen and carbon dioxide blood levels, heart rate and rhythm, breathing rate and rhythm, the flow of air through your mouth and nose, snoring, body muscle movements, and chest and belly movement.    Follow-Up: At [facility], you and your health needs are our priority.  As part of our continuing mission to provide you with exceptional heart care, we have created designated Provider Care Teams.  These Care Teams include your primary Cardiologist (physician) and Advanced Practice Providers (APPs -  Physician Assistants and Nurse Practitioners) who all work together to provide you with the care you need, when you need it.  We recommend signing up for the patient portal called "MyChart".  Sign up information is provided on this After Visit Summary.  MyChart is used to connect with patients for Virtual Visits (Telemedicine).  Patients are able to view lab/test results, encounter notes, upcoming appointments, etc.  Non-urgent messages can be sent to your provider as well.   To learn more about what you can do with MyChart, go to [email or web address].    Your next appointment:   1 year(s)  The format for your next  appointment:   In Person  Provider:   With MD    Other Instructions Thank you for choosing [facility]!

## 2020-10-24 NOTE — Assessment & Plan Note (Signed)
Untreated "severe" sleep apnea.  She would like to revisit this as she has heard there are newer options for masks.

## 2020-10-24 NOTE — Assessment & Plan Note (Signed)
BMI 48- weight down 18 lbs from last year

## 2020-11-04 ENCOUNTER — Other Ambulatory Visit (INDEPENDENT_AMBULATORY_CARE_PROVIDER_SITE_OTHER): Payer: Self-pay | Admitting: Nurse Practitioner

## 2020-11-04 DIAGNOSIS — E559 Vitamin D deficiency, unspecified: Secondary | ICD-10-CM

## 2020-11-04 DIAGNOSIS — I10 Essential (primary) hypertension: Secondary | ICD-10-CM

## 2020-11-04 DIAGNOSIS — E785 Hyperlipidemia, unspecified: Secondary | ICD-10-CM

## 2020-11-04 DIAGNOSIS — J4540 Moderate persistent asthma, uncomplicated: Secondary | ICD-10-CM

## 2020-11-04 DIAGNOSIS — I5032 Chronic diastolic (congestive) heart failure: Secondary | ICD-10-CM

## 2020-11-04 DIAGNOSIS — E119 Type 2 diabetes mellitus without complications: Secondary | ICD-10-CM

## 2020-11-06 ENCOUNTER — Telehealth: Payer: Self-pay | Admitting: Cardiology

## 2020-11-06 NOTE — Telephone Encounter (Signed)
No PA required for HST.  Need to schedule at [facility] because patient lives in [place].  Waiting on sleep lab to call back to schedule.

## 2020-11-07 NOTE — Telephone Encounter (Signed)
Called patient and lm that she is scheduled for Friday, December 10 at 7:30 pm at [facility], to expect info packet and left the sleep lab phone number.

## 2020-11-13 ENCOUNTER — Other Ambulatory Visit (INDEPENDENT_AMBULATORY_CARE_PROVIDER_SITE_OTHER): Payer: Self-pay | Admitting: Nurse Practitioner

## 2020-11-13 DIAGNOSIS — I5032 Chronic diastolic (congestive) heart failure: Secondary | ICD-10-CM

## 2020-11-13 DIAGNOSIS — E559 Vitamin D deficiency, unspecified: Secondary | ICD-10-CM

## 2020-11-13 DIAGNOSIS — E119 Type 2 diabetes mellitus without complications: Secondary | ICD-10-CM

## 2020-11-13 DIAGNOSIS — E785 Hyperlipidemia, unspecified: Secondary | ICD-10-CM

## 2020-11-13 DIAGNOSIS — J4540 Moderate persistent asthma, uncomplicated: Secondary | ICD-10-CM

## 2020-11-13 DIAGNOSIS — I10 Essential (primary) hypertension: Secondary | ICD-10-CM

## 2020-11-17 ENCOUNTER — Other Ambulatory Visit: Payer: Self-pay

## 2020-11-17 ENCOUNTER — Ambulatory Visit: Payer: Medicare Other | Attending: Cardiology | Admitting: Cardiology

## 2020-11-17 DIAGNOSIS — R0683 Snoring: Secondary | ICD-10-CM | POA: Diagnosis not present

## 2020-11-17 DIAGNOSIS — G4736 Sleep related hypoventilation in conditions classified elsewhere: Secondary | ICD-10-CM | POA: Diagnosis not present

## 2020-11-17 DIAGNOSIS — I10 Essential (primary) hypertension: Secondary | ICD-10-CM | POA: Insufficient documentation

## 2020-11-17 DIAGNOSIS — Z6841 Body Mass Index (BMI) 40.0 and over, adult: Secondary | ICD-10-CM | POA: Diagnosis not present

## 2020-11-17 DIAGNOSIS — R0902 Hypoxemia: Secondary | ICD-10-CM | POA: Insufficient documentation

## 2020-11-17 DIAGNOSIS — Z8669 Personal history of other diseases of the nervous system and sense organs: Secondary | ICD-10-CM | POA: Insufficient documentation

## 2020-11-17 DIAGNOSIS — G4734 Idiopathic sleep related nonobstructive alveolar hypoventilation: Secondary | ICD-10-CM

## 2020-11-23 ENCOUNTER — Encounter (INDEPENDENT_AMBULATORY_CARE_PROVIDER_SITE_OTHER): Payer: Self-pay | Admitting: Internal Medicine

## 2020-11-23 ENCOUNTER — Telehealth: Payer: Self-pay | Admitting: Cardiology

## 2020-11-23 NOTE — Telephone Encounter (Signed)
Sleep study ordered by [name], PA-C. Will forward to him for results.

## 2020-11-23 NOTE — Telephone Encounter (Signed)
New message     Patient calling for her sleep study results

## 2020-11-24 ENCOUNTER — Other Ambulatory Visit: Payer: Self-pay | Admitting: Obstetrics & Gynecology

## 2020-11-24 NOTE — Telephone Encounter (Signed)
Advised patient that sleep study has not been resulted yet and that we will call her as soon as it has been.

## 2020-11-28 ENCOUNTER — Encounter (INDEPENDENT_AMBULATORY_CARE_PROVIDER_SITE_OTHER): Payer: Self-pay | Admitting: Internal Medicine

## 2020-11-28 ENCOUNTER — Other Ambulatory Visit (INDEPENDENT_AMBULATORY_CARE_PROVIDER_SITE_OTHER): Payer: Self-pay | Admitting: Internal Medicine

## 2020-11-29 DIAGNOSIS — E119 Type 2 diabetes mellitus without complications: Secondary | ICD-10-CM | POA: Diagnosis not present

## 2020-11-29 LAB — HM DIABETES EYE EXAM

## 2020-12-02 NOTE — Procedures (Signed)
   Patient Name: Erin Sexton, Erin Sexton Date: 11/17/2020 Gender: Female D.O.B: 1970-12-14 Age (years): 49 Referring Provider: [name] Height (inches): 35 Interpreting Physician: Fransico Him MD, ABSM Weight (lbs): 330 RPSGT: Peak, Robert BMI: 43 MRN: [id number]  CLINICAL INFORMATION Sleep Study Type: HST  Indication for sleep study: N/A  Epworth Sleepiness Score: N/A  SLEEP STUDY TECHNIQUE A multi-channel overnight portable sleep study was performed. The channels recorded were: nasal airflow, thoracic respiratory movement, and oxygen saturation with a pulse oximetry. Snoring was also monitored.  MEDICATIONS Patient self administered medications include: N/A.  SLEEP ARCHITECTURE Patient was studied for 483.4 minutes. The sleep efficiency was 94.8 % and the patient was supine for 31.1%. The arousal index was 0.0 per hour.  RESPIRATORY PARAMETERS The overall AHI was 1.4 per hour, with a central apnea index of 0.1 per hour.  The oxygen nadir was 85% during sleep.  CARDIAC DATA Mean heart rate during sleep was 70.2 bpm.  IMPRESSIONS - No significant obstructive sleep apnea occurred during this study (AHI = 1.4/h). - No significant central sleep apnea occurred during this study (CAI = 0.1/h). - Moderate oxygen desaturation was noted during this study (Min O2 = 85%). - Patient snored 5.9% during the sleep.  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  - Avoid alcohol, sedatives and other CNS depressants that may worsen sleep apnea and disrupt normal sleep architecture. - Sleep hygiene should be reviewed to assess factors that may improve sleep quality. - Weight management and regular exercise should be initiated or continued.  [Electronically signed] 12/02/2020 07:07 PM  Fransico Him MD, ABSM Diplomate, American Board of Sleep Medicine

## 2020-12-04 ENCOUNTER — Telehealth: Payer: Self-pay | Admitting: *Deleted

## 2020-12-04 ENCOUNTER — Encounter (INDEPENDENT_AMBULATORY_CARE_PROVIDER_SITE_OTHER): Payer: Self-pay | Admitting: Internal Medicine

## 2020-12-04 DIAGNOSIS — G4733 Obstructive sleep apnea (adult) (pediatric): Secondary | ICD-10-CM

## 2020-12-04 NOTE — Telephone Encounter (Signed)
-----   Message from Sueanne Margarita, MD sent at 12/02/2020  7:09 PM EST ----- No OSA but significant nocturnal hypoxemia - please set up for in lab PSG

## 2020-12-04 NOTE — Telephone Encounter (Signed)
Informed patient of sleep study results and patient understanding was verbalized. Patient understands her sleep study showed No OSA but significant nocturnal hypoxemia - please set up for in lab PSG  PER DPR Left detailed message on voicemail and informed patient to call back with questions

## 2020-12-06 NOTE — Telephone Encounter (Addendum)
Patient is scheduled for lab study on 12/25/19. Patient understands her sleep study will be done at [facility]. Patient understands she will receive a sleep packet in a week or so. Patient understands to call if she does not receive the sleep packet in a timely manner.  Left detailed message on voicemail with date and time of titration and informed patient to call back to confirm or reschedule.

## 2020-12-09 ENCOUNTER — Other Ambulatory Visit: Payer: Self-pay | Admitting: Obstetrics & Gynecology

## 2020-12-18 ENCOUNTER — Other Ambulatory Visit (INDEPENDENT_AMBULATORY_CARE_PROVIDER_SITE_OTHER): Payer: Self-pay | Admitting: Nurse Practitioner

## 2020-12-18 ENCOUNTER — Other Ambulatory Visit (INDEPENDENT_AMBULATORY_CARE_PROVIDER_SITE_OTHER): Payer: Self-pay | Admitting: Internal Medicine

## 2020-12-18 DIAGNOSIS — E559 Vitamin D deficiency, unspecified: Secondary | ICD-10-CM

## 2020-12-18 DIAGNOSIS — J4540 Moderate persistent asthma, uncomplicated: Secondary | ICD-10-CM

## 2020-12-18 DIAGNOSIS — E785 Hyperlipidemia, unspecified: Secondary | ICD-10-CM

## 2020-12-18 DIAGNOSIS — I5032 Chronic diastolic (congestive) heart failure: Secondary | ICD-10-CM

## 2020-12-18 DIAGNOSIS — E119 Type 2 diabetes mellitus without complications: Secondary | ICD-10-CM

## 2020-12-18 DIAGNOSIS — I10 Essential (primary) hypertension: Secondary | ICD-10-CM

## 2020-12-22 ENCOUNTER — Other Ambulatory Visit: Payer: Self-pay | Admitting: Obstetrics & Gynecology

## 2020-12-25 ENCOUNTER — Other Ambulatory Visit (INDEPENDENT_AMBULATORY_CARE_PROVIDER_SITE_OTHER): Payer: Self-pay | Admitting: Internal Medicine

## 2020-12-25 DIAGNOSIS — E119 Type 2 diabetes mellitus without complications: Secondary | ICD-10-CM

## 2020-12-25 DIAGNOSIS — J4540 Moderate persistent asthma, uncomplicated: Secondary | ICD-10-CM

## 2020-12-25 DIAGNOSIS — E559 Vitamin D deficiency, unspecified: Secondary | ICD-10-CM

## 2020-12-25 DIAGNOSIS — I10 Essential (primary) hypertension: Secondary | ICD-10-CM

## 2020-12-25 DIAGNOSIS — I5032 Chronic diastolic (congestive) heart failure: Secondary | ICD-10-CM

## 2020-12-25 DIAGNOSIS — E785 Hyperlipidemia, unspecified: Secondary | ICD-10-CM

## 2021-01-01 ENCOUNTER — Ambulatory Visit (INDEPENDENT_AMBULATORY_CARE_PROVIDER_SITE_OTHER): Payer: Medicare Other | Admitting: Internal Medicine

## 2021-01-01 ENCOUNTER — Encounter (INDEPENDENT_AMBULATORY_CARE_PROVIDER_SITE_OTHER): Payer: Self-pay | Admitting: Internal Medicine

## 2021-01-01 ENCOUNTER — Other Ambulatory Visit: Payer: Self-pay

## 2021-01-01 DIAGNOSIS — E1169 Type 2 diabetes mellitus with other specified complication: Secondary | ICD-10-CM

## 2021-01-01 DIAGNOSIS — E669 Obesity, unspecified: Secondary | ICD-10-CM | POA: Diagnosis not present

## 2021-01-01 DIAGNOSIS — I10 Essential (primary) hypertension: Secondary | ICD-10-CM | POA: Diagnosis not present

## 2021-01-01 MED ORDER — NP THYROID 120 MG PO TABS: 120.0000 mg | ORAL_TABLET | Freq: Every day | ORAL | 3 refills | Status: DC

## 2021-01-01 NOTE — Progress Notes (Signed)
Metrics: Intervention Frequency ACO  Documented Smoking Status Yearly  Screened one or more times in 24 months  Cessation Counseling or  Active cessation medication Past 24 months  Past 24 months   Guideline developer: UpToDate (See UpToDate for funding source) Date Released: 2014       Wellness Office Visit  Subjective:  Patient ID: Erin Sexton, female    DOB: 1971-08-13  Age: 50 y.o. MRN: [id number]  CC: This lady comes in for follow-up of diabetes, morbid obesity and hypertension. HPI  She also has a history of congestive heart failure and seems to be stable from a cardiac standpoint. She continues to do reasonably well with intermittent fasting and try to eat healthier. She continues with Metformin, glipizide and Jardiance for her diabetes.  Her last hemoglobin A1c had improved but was still elevated. She continues to take Megace, synthetic progestogen, for postmenopausal bleeding despite having a Mirena IUD.  She continues on estradiol for hot flashes/menopausal symptoms. Past Medical History:  Diagnosis Date  . Abnormal CT scan    a. 02/2014: evaluated for dyspnea and initially placed on Xarelto for PE because radiologist could not exclude small peripheral pulmonary emboli because of limited contrast. However, after admission for ++vaginal bleeding several days later, it was felt she did NOT have PE - underwent further testing with neg VQ 03/06/14, neg LE duplex.  . Anemia   . Anxiety   . Asthma   . Blood transfusion without reported diagnosis   . Bone spur of right foot   . CHF (congestive heart failure) ([place])   . COPD (chronic obstructive pulmonary disease) ([place])   . Depression   . Diabetes mellitus without complication ([place])    a. Dx 02/2014.  [name] History of echocardiogram 2015   normal EF, Grade II diastolic dysfunction  . HOCM (hypertrophic obstructive cardiomyopathy) ([place])    a. Dx 94174.  [name] Hypertension   . Morbid obesity ([place])   . NSVT (nonsustained ventricular  tachycardia) ([place])    a. Noted on tele during 02/2014 adm for symptomatic anemia.  [name] SVT (supraventricular tachycardia) ([place])    a. Noted on tele during 02/2014 adm for symptomatic anemia.  . Vaginal bleeding    Past Surgical History:  Procedure Laterality Date  . COLONOSCOPY N/A 09/30/2019   Procedure: COLONOSCOPY;  Surgeon: Rogene Houston, MD;  Location: [facility];  Service: Endoscopy;  Laterality: N/A;  730  . DILITATION & CURRETTAGE/HYSTROSCOPY WITH NOVASURE ABLATION N/A 08/07/2016   Procedure: DILATATION & CURETTAGE/HYSTEROSCOPY WITH ATTPEMPTED NOVASURE ABLATION AND INSERTION OF IUD;  Surgeon: Florian Buff, MD;  Location: [facility] ORS;  Service: Gynecology;  Laterality: N/A;     Family History  Problem Relation Age of Onset  . Diabetes Mother   . Arthritis Mother   . Other Mother        WPW  . Mental illness Mother        Depression  . Heart disease Father        History of coronary bypass grafting at age 35  . Mental illness Sister   . Bipolar disorder Sister   . Heart disease Sister        possibly WPW  . Uterine cancer Maternal Grandmother   . Mental illness Maternal Grandmother   . Heart disease Maternal Grandfather   . CAD Neg Hx        No family history of EARLY CAD    Social History   Social History Narrative  Divorced for 3 years,married for 5 years.On disability since 2009 secondary to mental illness.   Social History   Tobacco Use  . Smoking status: Never Smoker  . Smokeless tobacco: Never Used  Substance Use Topics  . Alcohol use: No    Current Meds  Medication Sig  . ACCU-CHEK GUIDE test strip USE 1 STRIP TO CHECK GLUCOSE ONCE DAILY  . Accu-Chek Softclix Lancets lancets USE 1 LANCET TO CHECK GLUCOSE ONCE DAILY  . albuterol (PROVENTIL) (5 MG/ML) 0.5% nebulizer solution Take 0.5 mLs (2.5 mg total) by nebulization every 6 (six) hours as needed for wheezing or shortness of breath.  [name] albuterol (VENTOLIN HFA) 108 (90 Base) MCG/ACT inhaler Inhale 2  puffs into the lungs as needed (wheezing).   [name] atorvastatin (LIPITOR) 80 MG tablet Take 1 tablet by mouth once daily  . blood glucose meter kit and supplies KIT Dispense based on patient and insurance preference. Use once a day. Diagnosis code:E11.9  . buPROPion (WELLBUTRIN XL) 300 MG 24 hr tablet Take 1 tablet by mouth once daily  . cetirizine (ZYRTEC ALLERGY) 10 MG tablet Take 1 tablet (10 mg total) by mouth daily.  . Cholecalciferol (VITAMIN D) 125 MCG (5000 UT) CAPS Take 10,000 Units by mouth daily.  . clindamycin (CLEOCIN T) 1 % lotion APPLY TO AFFECTED AREAS OF BOILS TWICE DAILY AS NEEDED  . clotrimazole-betamethasone (LOTRISONE) cream APPLY CREAM TOPICALLY TWICE DAILY  . dicyclomine (BENTYL) 10 MG capsule Take 1 capsule (10 mg total) by mouth 2 (two) times daily before a meal.  . estradiol (ESTRACE) 2 MG tablet TAKE 1 TABLET BY MOUTH ONCE DAILY AT BEDTIME  . glipiZIDE (GLUCOTROL) 5 MG tablet TAKE 1 TABLET BY MOUTH TWICE DAILY BEFORE A MEAL  . JARDIANCE 10 MG TABS tablet Take 1 tablet by mouth once daily  . Lancet Device MISC Use to check your blood sugar daily  . levonorgestrel (MIRENA) 20 MCG/24HR IUD 1 each by Intrauterine route once.  [name] lisinopril (ZESTRIL) 2.5 MG tablet Take 1 tablet by mouth once daily  . megestrol (MEGACE) 40 MG tablet Take 1 tablet by mouth once daily  . metFORMIN (GLUCOPHAGE) 500 MG tablet Take 1 tablet by mouth twice daily  . metoprolol tartrate (LOPRESSOR) 50 MG tablet Take 1 tablet by mouth twice daily  . NP THYROID 120 MG tablet Take 1 tablet (120 mg total) by mouth daily before breakfast.  . NP THYROID 30 MG tablet Take 1 tablet by mouth once daily  . potassium chloride SA (KLOR-CON) 20 MEQ tablet Take 1 tablet by mouth twice daily  . SYMBICORT 160-4.5 MCG/ACT inhaler Inhale 2 puffs into the lungs daily.   [name] torsemide (DEMADEX) 20 MG tablet Take 1 tablet by mouth twice daily  . [DISCONTINUED] thyroid (NP THYROID) 60 MG tablet Take 2 tablets (120 mg total)  by mouth daily before breakfast.      Depression screen [facility] 2/9 01/01/2021 12/29/2019  Decreased Interest 2 0  Down, Depressed, Hopeless 3 0  PHQ - 2 Score 5 0  Altered sleeping 3 -  Tired, decreased energy 3 -  Change in appetite 1 -  Feeling bad or failure about yourself  0 -  Trouble concentrating 1 -  Moving slowly or fidgety/restless 0 -  Suicidal thoughts 0 -  PHQ-9 Score 13 -  Difficult doing work/chores Somewhat difficult -     Objective:   Today's Vitals: BP 124/74   Pulse 78   Temp 97.7 F (36.5 C) (Temporal)  Ht '5\' 9"'$  (1.753 m)   Wt (!) 319 lb 6.4 oz (144.9 kg)   LMP 06/04/2014   SpO2 99%   BMI 47.17 kg/m  Vitals with BMI 01/01/2021 10/24/2020 09/27/2020  Height $Remov'5\' 9"'RsYuAy$  $Remove'5\' 9"'hcjsIKT$  $RemoveB'5\' 9"'RhaCOPhc$   Weight 319 lbs 6 oz 330 lbs 317 lbs  BMI 47.15 16.10 96.04  Systolic 540 981 191  Diastolic 74 84 82  Pulse 78 88 80  Some encounter information is confidential and restricted. Go to Review Flowsheets activity to see all data.     Physical Exam  She has maintained her weight loss and her blood pressure is very good.  She remains morbidly obese.     Assessment   1. Morbid obesity ([place])   2. Diabetes mellitus type 2 in obese ([place])   3. Essential hypertension, benign       Tests ordered Orders Placed This Encounter  Procedures  . COMPLETE METABOLIC PANEL WITH GFR  . Hemoglobin A1c     Plan: 1. She will continue with current diabetic therapy and we will check an A1c.  I have told her that I would rather discontinue glipizide as this increases insulin levels and may be use another medication such as a GLP-1 agonist. 2. He will continue with the same dose of lisinopril, metoprolol for hypertension.  Her blood pressure is well controlled. 3. We also discussed the use of bioidentical hormones versus synthetic hormones such as Megace which increases the risk of breast and uterine cancer.  I have asked her to discuss this with her urologist to see if she can stop Megace  and stop progesterone.  Progesterone has not shown increased risk of breast or uterine cancer in the medical literature. 4. Follow-up in 3 months.   Meds ordered this encounter  Medications  . NP THYROID 120 MG tablet    Sig: Take 1 tablet (120 mg total) by mouth daily before breakfast.    Dispense:  30 tablet    Refill:  3    Sheran Newstrom Luther Parody, MD

## 2021-01-02 LAB — HEMOGLOBIN A1C
Hgb A1c MFr Bld: 8.7 % of total Hgb — ABNORMAL HIGH (ref <5.7)
Mean Plasma Glucose: 203 mg/dL
eAG (mmol/L): 11.2 mmol/L

## 2021-01-02 LAB — COMPLETE METABOLIC PANEL WITH GFR
AG Ratio: 1.6 (calc) (ref 1.0–2.5)
ALT: 17 U/L (ref 6–29)
AST: 18 U/L (ref 10–35)
Albumin: 4 g/dL (ref 3.6–5.1)
Alkaline phosphatase (APISO): 97 U/L (ref 37–153)
BUN/Creatinine Ratio: 16 (calc) (ref 6–22)
BUN: 22 mg/dL (ref 7–25)
CO2: 25 mmol/L (ref 20–32)
Calcium: 8.9 mg/dL (ref 8.6–10.4)
Chloride: 105 mmol/L (ref 98–110)
Creat: 1.35 mg/dL — ABNORMAL HIGH (ref 0.50–1.05)
GFR, Est African American: 52 mL/min/{1.73_m2} — ABNORMAL LOW (ref >=60)
GFR, Est Non African American: 45 mL/min/{1.73_m2} — ABNORMAL LOW (ref >=60)
Globulin: 2.5 g/dL (calc) (ref 1.9–3.7)
Glucose, Bld: 119 mg/dL — ABNORMAL HIGH (ref 65–99)
Potassium: 3.6 mmol/L (ref 3.5–5.3)
Sodium: 140 mmol/L (ref 135–146)
Total Bilirubin: 0.7 mg/dL (ref 0.2–1.2)
Total Protein: 6.5 g/dL (ref 6.1–8.1)

## 2021-01-07 ENCOUNTER — Other Ambulatory Visit (INDEPENDENT_AMBULATORY_CARE_PROVIDER_SITE_OTHER): Payer: Self-pay | Admitting: Nurse Practitioner

## 2021-01-07 DIAGNOSIS — J4540 Moderate persistent asthma, uncomplicated: Secondary | ICD-10-CM

## 2021-01-07 DIAGNOSIS — E119 Type 2 diabetes mellitus without complications: Secondary | ICD-10-CM

## 2021-01-07 DIAGNOSIS — E785 Hyperlipidemia, unspecified: Secondary | ICD-10-CM

## 2021-01-07 DIAGNOSIS — I5032 Chronic diastolic (congestive) heart failure: Secondary | ICD-10-CM

## 2021-01-07 DIAGNOSIS — I10 Essential (primary) hypertension: Secondary | ICD-10-CM

## 2021-01-07 DIAGNOSIS — E559 Vitamin D deficiency, unspecified: Secondary | ICD-10-CM

## 2021-01-12 ENCOUNTER — Ambulatory Visit: Payer: Medicare Other | Attending: Cardiology | Admitting: Cardiology

## 2021-01-12 ENCOUNTER — Other Ambulatory Visit: Payer: Self-pay

## 2021-01-12 DIAGNOSIS — G4733 Obstructive sleep apnea (adult) (pediatric): Secondary | ICD-10-CM

## 2021-01-12 DIAGNOSIS — R0683 Snoring: Secondary | ICD-10-CM | POA: Insufficient documentation

## 2021-01-12 DIAGNOSIS — Z72820 Sleep deprivation: Secondary | ICD-10-CM | POA: Insufficient documentation

## 2021-01-22 ENCOUNTER — Other Ambulatory Visit (INDEPENDENT_AMBULATORY_CARE_PROVIDER_SITE_OTHER): Payer: Self-pay | Admitting: Nurse Practitioner

## 2021-01-22 DIAGNOSIS — E119 Type 2 diabetes mellitus without complications: Secondary | ICD-10-CM

## 2021-01-22 DIAGNOSIS — E785 Hyperlipidemia, unspecified: Secondary | ICD-10-CM

## 2021-01-22 DIAGNOSIS — I5032 Chronic diastolic (congestive) heart failure: Secondary | ICD-10-CM

## 2021-01-22 DIAGNOSIS — J4540 Moderate persistent asthma, uncomplicated: Secondary | ICD-10-CM

## 2021-01-22 DIAGNOSIS — I10 Essential (primary) hypertension: Secondary | ICD-10-CM

## 2021-01-22 DIAGNOSIS — E559 Vitamin D deficiency, unspecified: Secondary | ICD-10-CM

## 2021-01-22 NOTE — Procedures (Signed)
   Patient Name: Erin Sexton, Erin Sexton Date:01/12/2021 Gender: Female D.O.B: 07-08-1971 Age (years): 50 Referring Provider: [name] Height (inches): 79 Interpreting Physician: Fransico Him MD, ABSM Weight (lbs): 330 RPSGT: Peak, Robert BMI: 49 MRN: [id number] Neck Size: 16.00  CLINICAL INFORMATION Sleep Study Type: NPSG  Indication for sleep study: N/A  Epworth Sleepiness Score: N/A  SLEEP STUDY TECHNIQUE As per the AASM Manual for the Scoring of Sleep and Associated Events v2.3 (April 2016) with a hypopnea requiring 4% desaturations.  The channels recorded and monitored were frontal, central and occipital EEG, electrooculogram (EOG), submentalis EMG (chin), nasal and oral airflow, thoracic and abdominal wall motion, anterior tibialis EMG, snore microphone, electrocardiogram, and pulse oximetry.  MEDICATIONS Medications self-administered by patient taken the night of the study : N/A  SLEEP ARCHITECTURE The study was initiated at 10:01:44 PM and ended at 3:55:07 AM.  Sleep onset time was 59.4 minutes and the sleep efficiency was 54.3%. The total sleep time was 192 minutes.  Stage REM latency was 221.0 minutes.  The patient spent 12.2% of the night in stage N1 sleep, 82.0% in stage N2 sleep, 3.6% in stage N3 and 2.1% in REM.  Alpha intrusion was absent.  Supine sleep was 100.00%.  RESPIRATORY PARAMETERS The overall apnea/hypopnea index (AHI) was 2.2 per hour. There were 0 total apneas, including 0 obstructive, 0 central and 0 mixed apneas. There were 7 hypopneas and 1 RERAs.  The AHI during Stage REM sleep was 45.0 per hour.  AHI while supine was 2.2 per hour.  The mean oxygen saturation was 94.8%. The minimum SpO2 during sleep was 88.0%.  soft snoring was noted during this study.  CARDIAC DATA The 2 lead EKG demonstrated sinus rhythm. The mean heart rate was 72.2 beats per minute. Other EKG findings include: PVCs.  LEG MOVEMENT DATA The total PLMS were 0  with a resulting PLMS index of 0.0. Associated arousal with leg movement index was 0.0 .  IMPRESSIONS - No significant obstructive sleep apnea occurred during this study (AHI = 2.2/h). - No significant central sleep apnea occurred during this study (CAI = 0.0/h). - The patient had minimal or no oxygen desaturation during the study (Min O2 = 88.0%) - The patient snored with soft snoring volume. - EKG findings include PVCs. - Clinically significant periodic limb movements did not occur during sleep. No significant associated arousals.  DIAGNOSIS - Nondiagnostic study due to insufficient sleep time.   RECOMMENDATIONS - Recommend repeat PSG with sleep aide.  - Avoid alcohol, sedatives and other CNS depressants that may worsen sleep apnea and disrupt normal sleep architecture. - Sleep hygiene should be reviewed to assess factors that may improve sleep quality. - Weight management and regular exercise should be initiated or continued if appropriate.  [Electronically signed] 01/22/2021 08:44 PM  Fransico Him MD, [place], American Board of Sleep Medicine   NPI: [id number]

## 2021-01-24 ENCOUNTER — Telehealth: Payer: Self-pay | Admitting: *Deleted

## 2021-01-24 DIAGNOSIS — Z8669 Personal history of other diseases of the nervous system and sense organs: Secondary | ICD-10-CM

## 2021-01-24 MED ORDER — ESZOPICLONE 2 MG PO TABS: 2.0000 mg | ORAL_TABLET | Freq: Every evening | ORAL | 0 refills | Status: DC | PRN

## 2021-01-24 NOTE — Telephone Encounter (Signed)
Informed patient of sleep study results and patient understanding was verbalized. Patient understands her sleep study showed Nondiagnostic sleep study due to insufficient sleep time - please repeat sleep study with sleep aide - will need to get Rx for sleep aide from ordering Md  Pt is aware and agreeable to her results  Will send to [facility] for sleep aide.   Per Tomasa Blase [place]  NPSG to be scheduled

## 2021-01-24 NOTE — Telephone Encounter (Signed)
-----   Message from Sueanne Margarita, MD sent at 01/22/2021  8:48 PM EST ----- Nondiagnostic sleep study due to insufficient sleep time - please repeat sleep study with sleep aide - will need to get Rx for sleep aide from ordering Md

## 2021-01-26 ENCOUNTER — Telehealth: Payer: Self-pay | Admitting: Cardiology

## 2021-01-26 ENCOUNTER — Telehealth: Payer: Medicare Other | Admitting: Cardiology

## 2021-01-26 NOTE — Telephone Encounter (Signed)
Follow up:    Patient calling to speak with the nurse concerning a sleep machine that she is suppose to get and dwould like to speak with some one concering this matter.

## 2021-01-31 NOTE — Addendum Note (Signed)
Addended by: Freada Bergeron on: 01/31/2021 03:39 PM   Modules accepted: Orders

## 2021-01-31 NOTE — Telephone Encounter (Signed)
Informed patient of sleep study results and patient understanding was verbalized.

## 2021-02-01 NOTE — Telephone Encounter (Signed)
Patient is scheduled for lab study on 02-12-21.  Patient understands her sleep [facility] will be done at [facility]. Patient understands she will receive a sleep packet in a week or so. Patient understands to call if she does not receive the sleep packet in a timely manner. Patient agrees with treatment and thanked me for call.

## 2021-02-02 ENCOUNTER — Other Ambulatory Visit (INDEPENDENT_AMBULATORY_CARE_PROVIDER_SITE_OTHER): Payer: Self-pay | Admitting: Internal Medicine

## 2021-02-02 DIAGNOSIS — I5032 Chronic diastolic (congestive) heart failure: Secondary | ICD-10-CM

## 2021-02-02 DIAGNOSIS — E119 Type 2 diabetes mellitus without complications: Secondary | ICD-10-CM

## 2021-02-02 DIAGNOSIS — E785 Hyperlipidemia, unspecified: Secondary | ICD-10-CM

## 2021-02-02 DIAGNOSIS — I10 Essential (primary) hypertension: Secondary | ICD-10-CM

## 2021-02-02 DIAGNOSIS — E559 Vitamin D deficiency, unspecified: Secondary | ICD-10-CM

## 2021-02-02 DIAGNOSIS — J4540 Moderate persistent asthma, uncomplicated: Secondary | ICD-10-CM

## 2021-02-05 ENCOUNTER — Other Ambulatory Visit (INDEPENDENT_AMBULATORY_CARE_PROVIDER_SITE_OTHER): Payer: Self-pay | Admitting: Nurse Practitioner

## 2021-02-05 DIAGNOSIS — J4540 Moderate persistent asthma, uncomplicated: Secondary | ICD-10-CM

## 2021-02-05 DIAGNOSIS — I10 Essential (primary) hypertension: Secondary | ICD-10-CM

## 2021-02-05 DIAGNOSIS — E119 Type 2 diabetes mellitus without complications: Secondary | ICD-10-CM

## 2021-02-05 DIAGNOSIS — E785 Hyperlipidemia, unspecified: Secondary | ICD-10-CM

## 2021-02-05 DIAGNOSIS — I5032 Chronic diastolic (congestive) heart failure: Secondary | ICD-10-CM

## 2021-02-05 DIAGNOSIS — E559 Vitamin D deficiency, unspecified: Secondary | ICD-10-CM

## 2021-02-07 ENCOUNTER — Telehealth: Payer: Self-pay | Admitting: Cardiology

## 2021-02-07 NOTE — Telephone Encounter (Signed)
Patient states she is supposed to get a medication lunesta, called in for her sleep study to help her sleep. She would like to know if it will be sent to the hospital or her pharmacy. She states her appointment is 02/12/2021.

## 2021-02-11 ENCOUNTER — Other Ambulatory Visit (INDEPENDENT_AMBULATORY_CARE_PROVIDER_SITE_OTHER): Payer: Self-pay | Admitting: Nurse Practitioner

## 2021-02-11 DIAGNOSIS — J4540 Moderate persistent asthma, uncomplicated: Secondary | ICD-10-CM

## 2021-02-11 DIAGNOSIS — E559 Vitamin D deficiency, unspecified: Secondary | ICD-10-CM

## 2021-02-11 DIAGNOSIS — E119 Type 2 diabetes mellitus without complications: Secondary | ICD-10-CM

## 2021-02-11 DIAGNOSIS — E785 Hyperlipidemia, unspecified: Secondary | ICD-10-CM

## 2021-02-11 DIAGNOSIS — I5032 Chronic diastolic (congestive) heart failure: Secondary | ICD-10-CM

## 2021-02-11 DIAGNOSIS — I10 Essential (primary) hypertension: Secondary | ICD-10-CM

## 2021-02-12 ENCOUNTER — Encounter: Payer: Medicare Other | Admitting: Cardiology

## 2021-02-12 ENCOUNTER — Other Ambulatory Visit (INDEPENDENT_AMBULATORY_CARE_PROVIDER_SITE_OTHER): Payer: Self-pay

## 2021-02-12 MED ORDER — JARDIANCE 10 MG PO TABS
10.0000 mg | ORAL_TABLET | Freq: Every day | ORAL | 3 refills | Status: DC
Start: 2021-02-12 — End: 2021-06-18

## 2021-02-13 ENCOUNTER — Telehealth (INDEPENDENT_AMBULATORY_CARE_PROVIDER_SITE_OTHER): Payer: Self-pay

## 2021-02-13 NOTE — Telephone Encounter (Signed)
I called patient to verify her pharmacy because I received a fax from [place] requesting a 90 day refill request for Jardiance that had just been sent to [place] for the patient. The patient verbalized that she does not use [facility]. Patient verbalized that she uses [facility] in [place]. Patient thanked me for calling.

## 2021-02-22 ENCOUNTER — Ambulatory Visit: Payer: Medicare Other | Attending: Cardiology | Admitting: Cardiology

## 2021-02-22 ENCOUNTER — Other Ambulatory Visit: Payer: Self-pay

## 2021-02-22 DIAGNOSIS — G4730 Sleep apnea, unspecified: Secondary | ICD-10-CM | POA: Insufficient documentation

## 2021-02-22 DIAGNOSIS — Z8669 Personal history of other diseases of the nervous system and sense organs: Secondary | ICD-10-CM

## 2021-02-27 NOTE — Telephone Encounter (Signed)
Called patient and explained I messaged Erin Sexton about her sleep aide and apologized that she did not receive her sleep aide for her sleep study. Patient says she brought a sleep aide from home to use.

## 2021-03-06 NOTE — Procedures (Signed)
   Patient Name: Erin Sexton, Erin Sexton Date: 02/22/2021 Gender: Female D.O.B: 12/26/1970 Age (years): 50 Referring Provider: [name] Height (inches): 30 Interpreting Physician: Fransico Him MD, ABSM Weight (lbs): 330 RPSGT: Peak, Robert BMI: 49 MRN: [id number] Neck Size: 16.00  CLINICAL INFORMATION Sleep Study Type: NPSG  Indication for sleep study: N/A  Epworth Sleepiness Score: N/A  SLEEP STUDY TECHNIQUE As per the AASM Manual for the Scoring of Sleep and Associated Events v2.3 (April 2016) with a hypopnea requiring 4% desaturations.  The channels recorded and monitored were frontal, central and occipital EEG, electrooculogram (EOG), submentalis EMG (chin), nasal and oral airflow, thoracic and abdominal wall motion, anterior tibialis EMG, snore microphone, electrocardiogram, and pulse oximetry.  MEDICATIONS Medications self-administered by patient taken the night of the study : N/A  SLEEP ARCHITECTURE The study was initiated at 10:03:06 PM and ended at 5:20:12 AM.  Sleep onset time was 60.6 minutes and the sleep efficiency was 60.2%. The total sleep time was 263 minutes.  Stage REM latency was N/A minutes.  The patient spent 13.69% of the night in stage N1 sleep, 75.10% in stage N2 sleep, 11.22% in stage N3 and 0% in REM.  Alpha intrusion was absent.  Supine sleep was 100.00%.  RESPIRATORY PARAMETERS The overall apnea/hypopnea index (AHI) was 0.5 per hour. There were 0 total apneas, including 0 obstructive, 0 central and 0 mixed apneas. There were 2 hypopneas and 0 RERAs.  The AHI during Stage REM sleep was N/A per hour.  AHI while supine was 0.5 per hour.  The mean oxygen saturation was 94.48%. The minimum SpO2 during sleep was 92.00%.  snoring was noted during this study.  CARDIAC DATA The 2 lead EKG demonstrated sinus rhythm. The mean heart rate was 73.86 beats per minute. Other EKG findings include: PVCs.  LEG MOVEMENT DATA The total PLMS were 0  with a resulting PLMS index of 0.00. Associated arousal with leg movement index was 0.0 .  IMPRESSIONS - No significant obstructive sleep apnea occurred during this study (AHI = 0.5/h). - The patient had minimal or no oxygen desaturation during the study (Min O2 = 92.00%) - No snoring was audible during this study. - EKG findings include PVCs. - Clinically significant periodic limb movements did not occur during sleep. No significant associated arousals.  DIAGNOSIS - Nondiagnostic study due to no REM sleep  RECOMMENDATIONS - Avoid alcohol, sedatives and other CNS depressants that may worsen sleep apnea and disrupt normal sleep architecture. - Sleep hygiene should be reviewed to assess factors that may improve sleep quality. - Weight management and regular exercise should be initiated or continued if appropriate. - Recommend repeat sleep study with sleep aide vs. Home sleep study   [Electronically signed] 03/06/2021 06:06 PM  Fransico Him MD, ABSM Diplomate, American Board of Sleep Medicine

## 2021-03-09 ENCOUNTER — Telehealth: Payer: Self-pay | Admitting: *Deleted

## 2021-03-09 NOTE — Telephone Encounter (Signed)
-----   Message from Sueanne Margarita, MD sent at 03/06/2021  6:44 PM EDT ----- Sleep study showed no sleep apnea .  She has had 3 sleep studies now due to poor sleep time.  This sleep study showed no OSA but did not get into REM sleep.  Patient was supposed to take a sleep aide with this study but it states that she did not take anything.  Please verify if she took a sleep aide with this sleep study

## 2021-03-09 NOTE — Telephone Encounter (Signed)
Informed patient of sleep study results and patient understanding was verbalized. Patient understands her sleep study showed no OSA but did not get into REM sleep. Patient reports she did take a sleep aide that she brought from home.

## 2021-03-10 NOTE — Telephone Encounter (Signed)
She does not qualify for CPAP

## 2021-03-12 ENCOUNTER — Encounter (INDEPENDENT_AMBULATORY_CARE_PROVIDER_SITE_OTHER): Payer: Self-pay | Admitting: Internal Medicine

## 2021-03-14 ENCOUNTER — Other Ambulatory Visit: Payer: Self-pay

## 2021-03-14 MED ORDER — ESTRADIOL 2 MG PO TABS: 2.0000 mg | ORAL_TABLET | Freq: Every day | ORAL | 3 refills | Status: DC

## 2021-03-16 ENCOUNTER — Other Ambulatory Visit (INDEPENDENT_AMBULATORY_CARE_PROVIDER_SITE_OTHER): Payer: Self-pay | Admitting: Internal Medicine

## 2021-03-16 DIAGNOSIS — E119 Type 2 diabetes mellitus without complications: Secondary | ICD-10-CM

## 2021-03-16 DIAGNOSIS — I10 Essential (primary) hypertension: Secondary | ICD-10-CM

## 2021-03-16 DIAGNOSIS — E785 Hyperlipidemia, unspecified: Secondary | ICD-10-CM

## 2021-03-16 DIAGNOSIS — E559 Vitamin D deficiency, unspecified: Secondary | ICD-10-CM

## 2021-03-16 DIAGNOSIS — I5032 Chronic diastolic (congestive) heart failure: Secondary | ICD-10-CM

## 2021-03-16 DIAGNOSIS — J4540 Moderate persistent asthma, uncomplicated: Secondary | ICD-10-CM

## 2021-03-26 ENCOUNTER — Other Ambulatory Visit (INDEPENDENT_AMBULATORY_CARE_PROVIDER_SITE_OTHER): Payer: Self-pay | Admitting: Internal Medicine

## 2021-03-26 DIAGNOSIS — I10 Essential (primary) hypertension: Secondary | ICD-10-CM

## 2021-03-26 DIAGNOSIS — I5032 Chronic diastolic (congestive) heart failure: Secondary | ICD-10-CM

## 2021-03-26 DIAGNOSIS — E119 Type 2 diabetes mellitus without complications: Secondary | ICD-10-CM

## 2021-03-26 DIAGNOSIS — J4540 Moderate persistent asthma, uncomplicated: Secondary | ICD-10-CM

## 2021-03-26 DIAGNOSIS — E785 Hyperlipidemia, unspecified: Secondary | ICD-10-CM

## 2021-03-26 DIAGNOSIS — E559 Vitamin D deficiency, unspecified: Secondary | ICD-10-CM

## 2021-04-03 ENCOUNTER — Encounter (INDEPENDENT_AMBULATORY_CARE_PROVIDER_SITE_OTHER): Payer: Self-pay | Admitting: Internal Medicine

## 2021-04-03 ENCOUNTER — Other Ambulatory Visit: Payer: Self-pay

## 2021-04-03 ENCOUNTER — Ambulatory Visit (INDEPENDENT_AMBULATORY_CARE_PROVIDER_SITE_OTHER): Payer: Medicare Other | Admitting: Internal Medicine

## 2021-04-03 DIAGNOSIS — I10 Essential (primary) hypertension: Secondary | ICD-10-CM

## 2021-04-03 DIAGNOSIS — E669 Obesity, unspecified: Secondary | ICD-10-CM

## 2021-04-03 DIAGNOSIS — E1169 Type 2 diabetes mellitus with other specified complication: Secondary | ICD-10-CM

## 2021-04-03 NOTE — Progress Notes (Signed)
Metrics: Intervention Frequency ACO  Documented Smoking Status Yearly  Screened one or more times in 24 months  Cessation Counseling or  Active cessation medication Past 24 months  Past 24 months   Guideline developer: UpToDate (See UpToDate for funding source) Date Released: 2014       Wellness Office Visit  Subjective:  Patient ID: Erin Sexton, female    DOB: March 17, 1971  Age: 49 y.o. MRN: [id number]  CC: This lady comes in for follow-up of diabetes, morbid obesity, hypertension. HPI She also has thyroid hypofunction and is on thyroid medication. She has a history of congestive heart failure.  She had 2 sleep studies which apparently were nondiagnostic because of issues with the studies. She continues on glipizide and metformin as well as Jardiance for her diabetes.  Her last hemoglobin A1c showed improvement to 8.7% compared to 9.7% previously. She continues to do intermittent fasting on a daily basis and tries to eat healthy.  As a result, she continues to lose weight. She takes estradiol by mouth and has a Mirena IUD.  She also takes Megace and I have asked her to discuss this with her gynecologist, she realizes she needs to make an appointment to get a Pap smear soon.  Past Medical History:  Diagnosis Date  . Abnormal CT scan    a. 02/2014: evaluated for dyspnea and initially placed on Xarelto for PE because radiologist could not exclude small peripheral pulmonary emboli because of limited contrast. However, after admission for ++vaginal bleeding several days later, it was felt she did NOT have PE - underwent further testing with neg VQ 03/06/14, neg LE duplex.  . Anemia   . Anxiety   . Asthma   . Blood transfusion without reported diagnosis   . Bone spur of right foot   . CHF (congestive heart failure) ([place])   . COPD (chronic obstructive pulmonary disease) ([place])   . Depression   . Diabetes mellitus without complication ([place])    a. Dx 02/2014.  [name] History of echocardiogram  2015   normal EF, Grade II diastolic dysfunction  . HOCM (hypertrophic obstructive cardiomyopathy) ([place])    a. Dx 27517.  [name] Hypertension   . Morbid obesity ([place])   . NSVT (nonsustained ventricular tachycardia) ([place])    a. Noted on tele during 02/2014 adm for symptomatic anemia.  [name] SVT (supraventricular tachycardia) ([place])    a. Noted on tele during 02/2014 adm for symptomatic anemia.  . Vaginal bleeding    Past Surgical History:  Procedure Laterality Date  . COLONOSCOPY N/A 09/30/2019   Procedure: COLONOSCOPY;  Surgeon: Rogene Houston, MD;  [facility];  Service: Endoscopy;  Laterality: N/A;  730  . DILITATION & CURRETTAGE/HYSTROSCOPY WITH NOVASURE ABLATION N/A 08/07/2016   Procedure: DILATATION & CURETTAGE/HYSTEROSCOPY WITH ATTPEMPTED NOVASURE ABLATION AND INSERTION OF IUD;  Surgeon: Florian Buff, MD;  Location: [facility];  Service: Gynecology;  Laterality: N/A;     Family History  Problem Relation Age of Onset  . Diabetes Mother   . Arthritis Mother   . Other Mother        WPW  . Mental illness Mother        Depression  . Heart disease Father        History of coronary bypass grafting at age 51  . Mental illness Sister   . Bipolar disorder Sister   . Heart disease Sister        possibly WPW  . Uterine cancer Maternal  Grandmother   . Mental illness Maternal Grandmother   . Heart disease Maternal Grandfather   . CAD Neg Hx        No family history of EARLY CAD    Social History   Social History Narrative   Divorced for 3 years,married for 5 years.On disability since 2009 secondary to mental illness.   Social History   Tobacco Use  . Smoking status: Never Smoker  . Smokeless tobacco: Never Used  Substance Use Topics  . Alcohol use: No    Current Meds  Medication Sig  . ACCU-CHEK GUIDE test strip USE 1 STRIP TO CHECK GLUCOSE ONCE DAILY  . Accu-Chek Softclix Lancets lancets USE 1 LANCET TO CHECK GLUCOSE ONCE DAILY  . albuterol (PROVENTIL) (5 MG/ML)  0.5% nebulizer solution Take 0.5 mLs (2.5 mg total) by nebulization every 6 (six) hours as needed for wheezing or shortness of breath.  [name] albuterol (VENTOLIN HFA) 108 (90 Base) MCG/ACT inhaler Inhale 2 puffs into the lungs as needed (wheezing).   [name] atorvastatin (LIPITOR) 80 MG tablet Take 1 tablet by mouth once daily  . blood glucose meter kit and supplies KIT Dispense based on patient and insurance preference. Use once a day. Diagnosis code:E11.9  . buPROPion (WELLBUTRIN XL) 300 MG 24 hr tablet Take 1 tablet by mouth once daily  . cetirizine (ZYRTEC ALLERGY) 10 MG tablet Take 1 tablet (10 mg total) by mouth daily.  . Cholecalciferol (VITAMIN D) 125 MCG (5000 UT) CAPS Take 10,000 Units by mouth daily.  . clindamycin (CLEOCIN T) 1 % lotion APPLY TO AFFECTED AREAS OF BOILS TWICE DAILY AS NEEDED  . clotrimazole-betamethasone (LOTRISONE) cream APPLY CREAM TOPICALLY TWICE DAILY  . dicyclomine (BENTYL) 10 MG capsule Take 1 capsule (10 mg total) by mouth 2 (two) times daily before a meal.  . estradiol (ESTRACE) 2 MG tablet Take 1 tablet (2 mg total) by mouth at bedtime.  . eszopiclone (LUNESTA) 2 MG TABS tablet Take 1 tablet (2 mg total) by mouth at bedtime as needed for up to 1 dose for sleep.  [name] glipiZIDE (GLUCOTROL) 5 MG tablet TAKE 1 TABLET BY MOUTH TWICE DAILY BEFORE A MEAL  . JARDIANCE 10 MG TABS tablet Take 1 tablet (10 mg total) by mouth daily.  Elmore Guise Device MISC Use to check your blood sugar daily  . levonorgestrel (MIRENA) 20 MCG/24HR IUD 1 each by Intrauterine route once.  [name] lisinopril (ZESTRIL) 2.5 MG tablet Take 1 tablet by mouth once daily  . megestrol (MEGACE) 40 MG tablet Take 1 tablet by mouth once daily  . metFORMIN (GLUCOPHAGE) 500 MG tablet Take 1 tablet by mouth twice daily  . metoprolol tartrate (LOPRESSOR) 50 MG tablet Take 1 tablet by mouth twice daily  . NP THYROID 120 MG tablet Take 1 tablet (120 mg total) by mouth daily before breakfast.  . NP THYROID 30 MG tablet Take 1  tablet by mouth once daily  . SYMBICORT 160-4.5 MCG/ACT inhaler Inhale 2 puffs into the lungs daily.   [name] torsemide (DEMADEX) 20 MG tablet Take 1 tablet by mouth twice daily     [place] Office Visit from 01/01/2021 in [facility]  PHQ-9 Total Score 13      Objective:   Today's Vitals: BP 128/70 (BP Location: Right Arm, Patient Position: Sitting, Cuff Size: Normal)   Pulse 75   Temp 97.6 F (36.4 C) (Temporal)   Resp 18   Ht $R'5\' 9"'pI$  (1.753 m)   Wt (!) 311  lb (141.1 kg)   LMP 06/04/2014   SpO2 97%   BMI 45.93 kg/m  Vitals with BMI 04/03/2021 01/01/2021 10/24/2020  Height $Remov'5\' 9"'kqrTRl$  $Remove'5\' 9"'SWPpeim$  $RemoveB'5\' 9"'qmcwECaL$   Weight 311 lbs 319 lbs 6 oz 330 lbs  BMI 45.91 09.64 38.38  Systolic 184 037 543  Diastolic 70 74 84  Pulse 75 78 88  Some encounter information is confidential and restricted. Go to Review Flowsheets activity to see all data.     Physical Exam Although she remains morbidly obese, she has lost 8 pounds since the last time I saw her.  Blood pressure is excellent.  Microfilament testing of her feet did not show any evidence of peripheral neuropathy.      Assessment   1. Morbid obesity ([place])   2. Diabetes mellitus type 2 in obese ([place])   3. Essential hypertension, benign       Tests ordered Orders Placed This Encounter  Procedures  . Basic metabolic panel  . Hemoglobin A1c     Plan: 1. Continue with nutritional plan as before. 2. Continue with all the diabetic medications and we will check an A1c.  My plan is to discontinue glipizide, optimize metformin. 3. Continue with lisinopril for hypertension as well as metoprolol and blood pressure is well controlled.  Check electrolytes. 4. I will follow-up in about 6 weeks for close monitoring and most likely to optimize her thyroid.   No orders of the defined types were placed in this encounter.   Doree Albee, MD

## 2021-04-04 ENCOUNTER — Encounter (INDEPENDENT_AMBULATORY_CARE_PROVIDER_SITE_OTHER): Payer: Self-pay | Admitting: Internal Medicine

## 2021-04-04 LAB — BASIC METABOLIC PANEL
BUN/Creatinine Ratio: 16 (calc) (ref 6–22)
BUN: 20 mg/dL (ref 7–25)
CO2: 24 mmol/L (ref 20–32)
Calcium: 8.4 mg/dL — ABNORMAL LOW (ref 8.6–10.4)
Chloride: 107 mmol/L (ref 98–110)
Creat: 1.28 mg/dL — ABNORMAL HIGH (ref 0.50–1.05)
Glucose, Bld: 111 mg/dL — ABNORMAL HIGH (ref 65–99)
Potassium: 3.7 mmol/L (ref 3.5–5.3)
Sodium: 142 mmol/L (ref 135–146)

## 2021-04-04 LAB — HEMOGLOBIN A1C
Hgb A1c MFr Bld: 7.7 % of total Hgb — ABNORMAL HIGH (ref <5.7)
Mean Plasma Glucose: 174 mg/dL
eAG (mmol/L): 9.7 mmol/L

## 2021-04-05 NOTE — Telephone Encounter (Signed)
Pt is aware and agreeable to normal results  

## 2021-04-06 ENCOUNTER — Other Ambulatory Visit (INDEPENDENT_AMBULATORY_CARE_PROVIDER_SITE_OTHER): Payer: Self-pay | Admitting: Nurse Practitioner

## 2021-04-06 DIAGNOSIS — E559 Vitamin D deficiency, unspecified: Secondary | ICD-10-CM

## 2021-04-06 DIAGNOSIS — I5032 Chronic diastolic (congestive) heart failure: Secondary | ICD-10-CM

## 2021-04-06 DIAGNOSIS — E785 Hyperlipidemia, unspecified: Secondary | ICD-10-CM

## 2021-04-06 DIAGNOSIS — I10 Essential (primary) hypertension: Secondary | ICD-10-CM

## 2021-04-06 DIAGNOSIS — J4540 Moderate persistent asthma, uncomplicated: Secondary | ICD-10-CM

## 2021-04-06 DIAGNOSIS — E119 Type 2 diabetes mellitus without complications: Secondary | ICD-10-CM

## 2021-04-30 ENCOUNTER — Other Ambulatory Visit (INDEPENDENT_AMBULATORY_CARE_PROVIDER_SITE_OTHER): Payer: Self-pay | Admitting: Internal Medicine

## 2021-04-30 DIAGNOSIS — E119 Type 2 diabetes mellitus without complications: Secondary | ICD-10-CM

## 2021-04-30 DIAGNOSIS — J4540 Moderate persistent asthma, uncomplicated: Secondary | ICD-10-CM

## 2021-04-30 DIAGNOSIS — I5032 Chronic diastolic (congestive) heart failure: Secondary | ICD-10-CM

## 2021-04-30 DIAGNOSIS — E559 Vitamin D deficiency, unspecified: Secondary | ICD-10-CM

## 2021-04-30 DIAGNOSIS — E785 Hyperlipidemia, unspecified: Secondary | ICD-10-CM

## 2021-04-30 DIAGNOSIS — I10 Essential (primary) hypertension: Secondary | ICD-10-CM

## 2021-05-08 ENCOUNTER — Encounter (INDEPENDENT_AMBULATORY_CARE_PROVIDER_SITE_OTHER): Payer: Self-pay | Admitting: Internal Medicine

## 2021-05-08 ENCOUNTER — Encounter: Payer: Self-pay | Admitting: Obstetrics & Gynecology

## 2021-05-08 ENCOUNTER — Other Ambulatory Visit: Payer: Self-pay

## 2021-05-08 ENCOUNTER — Other Ambulatory Visit (HOSPITAL_COMMUNITY)
Admission: RE | Admit: 2021-05-08 | Discharge: 2021-05-08 | Disposition: A | Discharge status: Home / Self Care | Payer: Medicare Other | Source: Ambulatory Visit | Attending: Obstetrics & Gynecology | Admitting: Obstetrics & Gynecology

## 2021-05-08 ENCOUNTER — Ambulatory Visit (INDEPENDENT_AMBULATORY_CARE_PROVIDER_SITE_OTHER): Payer: Medicare Other | Admitting: Obstetrics & Gynecology

## 2021-05-08 VITALS — BP 136/86 | HR 78 | Ht 68.0 in | Wt 313.0 lb

## 2021-05-08 DIAGNOSIS — Z01419 Encounter for gynecological examination (general) (routine) without abnormal findings: Secondary | ICD-10-CM | POA: Diagnosis not present

## 2021-05-08 DIAGNOSIS — Z1151 Encounter for screening for human papillomavirus (HPV): Secondary | ICD-10-CM | POA: Diagnosis not present

## 2021-05-08 DIAGNOSIS — Z1211 Encounter for screening for malignant neoplasm of colon: Secondary | ICD-10-CM

## 2021-05-08 DIAGNOSIS — Z1212 Encounter for screening for malignant neoplasm of rectum: Secondary | ICD-10-CM

## 2021-05-08 LAB — HEMOCCULT GUIAC POC 1CARD (OFFICE): Fecal Occult Blood, POC: NEGATIVE

## 2021-05-08 NOTE — Progress Notes (Signed)
Subjective:     Erin Sexton is a 50 y.o. female here for a routine exam.  No LMP recorded. (Menstrual status: IUD). G0P0000 Birth Control Method:  Post menopausal Menstrual Calendar(currently): no bleeding  Current complaints: none.   Current acute medical issues:  none   Recent Gynecologic History No LMP recorded. (Menstrual status: IUD). Last Pap: today,   Last mammogram: 8/21,  normal  Past Medical History:  Diagnosis Date  . Abnormal CT scan    a. 02/2014: evaluated for dyspnea and initially placed on Xarelto for PE because radiologist could not exclude small peripheral pulmonary emboli because of limited contrast. However, after admission for ++vaginal bleeding several days later, it was felt she did NOT have PE - underwent further testing with neg VQ 03/06/14, neg LE duplex.  . Anemia   . Anxiety   . Asthma   . Blood transfusion without reported diagnosis   . Bone spur of right foot   . CHF (congestive heart failure) ([place])   . COPD (chronic obstructive pulmonary disease) ([place])   . Depression   . Diabetes mellitus without complication ([place])    a. Dx 02/2014.  [name] History of echocardiogram 2015   normal EF, Grade II diastolic dysfunction  . HOCM (hypertrophic obstructive cardiomyopathy) ([place])    a. Dx 61443.  [name] Hypertension   . Morbid obesity ([place])   . NSVT (nonsustained ventricular tachycardia) ([place])    a. Noted on tele during 02/2014 adm for symptomatic anemia.  [name] SVT (supraventricular tachycardia) ([place])    a. Noted on tele during 02/2014 adm for symptomatic anemia.  . Vaginal bleeding     Past Surgical History:  Procedure Laterality Date  . COLONOSCOPY N/A 09/30/2019   Procedure: COLONOSCOPY;  Surgeon: Rogene Houston, MD;  [facility];  Service: Endoscopy;  Laterality: N/A;  730  . DILITATION & CURRETTAGE/HYSTROSCOPY WITH NOVASURE ABLATION N/A 08/07/2016   Procedure: DILATATION & CURETTAGE/HYSTEROSCOPY WITH ATTPEMPTED NOVASURE ABLATION AND INSERTION OF  IUD;  Surgeon: Florian Buff, MD;  Location: [facility];  Service: Gynecology;  Laterality: N/A;    OB History    Gravida  0   Para  0   Term  0   Preterm  0   AB  0   Living  0     SAB  0   IAB  0   Ectopic  0   Multiple  0   Live Births  0           Social History   Socioeconomic History  . Marital status: Divorced    Spouse name: Not on file  . Number of children: 0  . Years of education: Not on file  . Highest education level: Not on file  Occupational History  . Not on file  Tobacco Use  . Smoking status: Never Smoker  . Smokeless tobacco: Never Used  Vaping Use  . Vaping Use: Never used  Substance and Sexual Activity  . Alcohol use: No  . Drug use: No  . Sexual activity: Not Currently    Birth control/protection: I.U.D.  Other Topics Concern  . Not on file  Social History Narrative   Divorced for 3 years,married for 5 years.On disability since 2009 secondary to mental illness.   Social Determinants of Health   Financial Resource Strain: Medium Risk  . Difficulty of Paying Living Expenses: Somewhat hard  Food Insecurity: No Food Insecurity  . Worried About Charity fundraiser in the  Last Year: Never true  . Ran Out of Food in the Last Year: Never true  Transportation Needs: No Transportation Needs  . Lack of Transportation (Medical): No  . Lack of Transportation (Non-Medical): No  Physical Activity: Sufficiently Active  . Days of Exercise per Week: 4 days  . Minutes of Exercise per Session: 40 min  Stress: No Stress Concern Present  . Feeling of Stress : Only a little  Social Connections: Socially Isolated  . Frequency of Communication with Friends and Family: Once a week  . Frequency of Social Gatherings with Friends and Family: Never  . Attends Religious Services: Never  . Active Member of Clubs or Organizations: No  . Attends Archivist Meetings: Never  . Marital Status: Divorced    Family History  Problem Relation Age of  Onset  . Diabetes Mother   . Arthritis Mother   . Other Mother        WPW  . Mental illness Mother        Depression  . Heart disease Father        History of coronary bypass grafting at age 62  . Mental illness Sister   . Bipolar disorder Sister   . Heart disease Sister        possibly WPW  . Uterine cancer Maternal Grandmother   . Mental illness Maternal Grandmother   . Heart disease Maternal Grandfather   . CAD Neg Hx        No family history of EARLY CAD     Current Outpatient Medications:  .  ACCU-CHEK GUIDE test strip, USE 1 STRIP TO CHECK GLUCOSE ONCE DAILY, Disp: 100 each, Rfl: 0 .  Accu-Chek Softclix Lancets lancets, USE 1 LANCET TO CHECK GLUCOSE ONCE DAILY, Disp: 100 each, Rfl: 0 .  albuterol (PROVENTIL) (5 MG/ML) 0.5% nebulizer solution, Take 0.5 mLs (2.5 mg total) by nebulization every 6 (six) hours as needed for wheezing or shortness of breath., Disp: 20 mL, Rfl: 0 .  albuterol (VENTOLIN HFA) 108 (90 Base) MCG/ACT inhaler, Inhale 2 puffs into the lungs as needed (wheezing). , Disp: , Rfl:  .  atorvastatin (LIPITOR) 80 MG tablet, Take 1 tablet by mouth once daily, Disp: 90 tablet, Rfl: 0 .  blood glucose meter kit and supplies KIT, Dispense based on patient and insurance preference. Use once a day. Diagnosis code:E11.9, Disp: 1 each, Rfl: 0 .  buPROPion (WELLBUTRIN XL) 300 MG 24 hr tablet, Take 1 tablet by mouth once daily, Disp: 90 tablet, Rfl: 0 .  cetirizine (ZYRTEC ALLERGY) 10 MG tablet, Take 1 tablet (10 mg total) by mouth daily. (Patient taking differently: Take 10 mg by mouth.), Disp: 30 tablet, Rfl: 0 .  Cholecalciferol (VITAMIN D3 PO), Take 10,000 Units by mouth daily., Disp: , Rfl:  .  clindamycin (CLEOCIN T) 1 % lotion, APPLY TO AFFECTED AREAS OF BOILS TWICE DAILY AS NEEDED, Disp: , Rfl:  .  clotrimazole-betamethasone (LOTRISONE) cream, APPLY CREAM TOPICALLY TWICE DAILY, Disp: 15 g, Rfl: 0 .  estradiol (ESTRACE) 2 MG tablet, Take 1 tablet (2 mg total) by mouth  at bedtime., Disp: 90 tablet, Rfl: 3 .  glipiZIDE (GLUCOTROL) 5 MG tablet, TAKE 1 TABLET BY MOUTH TWICE DAILY BEFORE A MEAL, Disp: 180 tablet, Rfl: 0 .  JARDIANCE 10 MG TABS tablet, Take 1 tablet (10 mg total) by mouth daily., Disp: 30 tablet, Rfl: 3 .  Lancet Device MISC, Use to check your blood sugar daily, Disp: 100 each, Rfl: 5 .  levonorgestrel (MIRENA) 20 MCG/24HR IUD, 1 each by Intrauterine route once., Disp: , Rfl:  .  lisinopril (ZESTRIL) 2.5 MG tablet, Take 1 tablet by mouth once daily, Disp: 90 tablet, Rfl: 0 .  megestrol (MEGACE) 40 MG tablet, Take 1 tablet by mouth once daily, Disp: 30 tablet, Rfl: 11 .  metFORMIN (GLUCOPHAGE) 500 MG tablet, Take 1 tablet by mouth twice daily, Disp: 180 tablet, Rfl: 0 .  metoprolol tartrate (LOPRESSOR) 50 MG tablet, Take 1 tablet by mouth twice daily, Disp: 180 tablet, Rfl: 0 .  NP THYROID 120 MG tablet, Take 1 tablet (120 mg total) by mouth daily before breakfast., Disp: 30 tablet, Rfl: 3 .  SYMBICORT 160-4.5 MCG/ACT inhaler, Inhale 2 puffs into the lungs as needed., Disp: , Rfl:  .  thyroid (NP THYROID) 30 MG tablet, Take 1 tablet (30 mg total) by mouth daily before breakfast., Disp: 30 tablet, Rfl: 3 .  torsemide (DEMADEX) 20 MG tablet, Take 1 tablet by mouth twice daily, Disp: 180 tablet, Rfl: 0 .  potassium chloride SA (KLOR-CON) 20 MEQ tablet, Take 1 tablet by mouth twice daily, Disp: 60 tablet, Rfl: 3  Review of Systems  Review of Systems  Constitutional: Negative for fever, chills, weight loss, malaise/fatigue and diaphoresis.  HENT: Negative for hearing loss, ear pain, nosebleeds, congestion, sore throat, neck pain, tinnitus and ear discharge.   Eyes: Negative for blurred vision, double vision, photophobia, pain, discharge and redness.  Respiratory: Negative for cough, hemoptysis, sputum production, shortness of breath, wheezing and stridor.   Cardiovascular: Negative for chest pain, palpitations, orthopnea, claudication, leg swelling and  PND.  Gastrointestinal: negative for abdominal pain. Negative for heartburn, nausea, vomiting, diarrhea, constipation, blood in stool and melena.  Genitourinary: Negative for dysuria, urgency, frequency, hematuria and flank pain.  Musculoskeletal: Negative for myalgias, back pain, joint pain and falls.  Skin: Negative for itching and rash.  Neurological: Negative for dizziness, tingling, tremors, sensory change, speech change, focal weakness, seizures, loss of consciousness, weakness and headaches.  Endo/Heme/Allergies: Negative for environmental allergies and polydipsia. Does not bruise/bleed easily.  Psychiatric/Behavioral: Negative for depression, suicidal ideas, hallucinations, memory loss and substance abuse. The patient is not nervous/anxious and does not have insomnia.        Objective:  Blood pressure 136/86, pulse 78, height $RemoveBe'5\' 8"'SSmwzVNOu$  (1.727 m), weight (!) 313 lb (142 kg).   Physical Exam  Vitals reviewed. Constitutional: She is oriented to person, place, and time. She appears well-developed and well-nourished.  HENT:  Head: Normocephalic and atraumatic.        Right Ear: External ear normal.  Left Ear: External ear normal.  Nose: Nose normal.  Mouth/Throat: Oropharynx is clear and moist.  Eyes: Conjunctivae and EOM are normal. Pupils are equal, round, and reactive to light. Right eye exhibits no discharge. Left eye exhibits no discharge. No scleral icterus.  Neck: Normal range of motion. Neck supple. No tracheal deviation present. No thyromegaly present.  Cardiovascular: Normal rate, regular rhythm, normal heart sounds and intact distal pulses.  Exam reveals no gallop and no friction rub.   No murmur heard. Respiratory: Effort normal and breath sounds normal. No respiratory distress. She has no wheezes. She has no rales. She exhibits no tenderness.  GI: Soft. Bowel sounds are normal. She exhibits no distension and no mass. There is no tenderness. There is no rebound and no guarding.   Genitourinary:  Breasts no masses skin changes or nipple changes bilaterally      Vulva is normal without  lesions Vagina is pink moist without discharge Cervix normal in appearance and pap is done Uterus is normal size shape and contour Adnexa is negative with normal sized ovaries  {Rectal    hemoccult negative, normal tone, no masses  Musculoskeletal: Normal range of motion. She exhibits no edema and no tenderness.  Neurological: She is alert and oriented to person, place, and time. She has normal reflexes. She displays normal reflexes. No cranial nerve deficit. She exhibits normal muscle tone. Coordination normal.  Skin: Skin is warm and dry. No rash noted. No erythema. No pallor.  Psychiatric: She has a normal mood and affect. Her behavior is normal. Judgment and thought content normal.       Medications Ordered at today's visit: No orders of the defined types were placed in this encounter.   Other orders placed at today's visit: Orders Placed This Encounter  Procedures  . POCT occult blood stool      Assessment:    Normal Gyn exam.    Plan:    Contraception: IUD. Follow up in: 2 years.     Return in about 2 years (around 05/09/2023) for Follow up, with Dr Elonda Husky.

## 2021-05-09 ENCOUNTER — Encounter (INDEPENDENT_AMBULATORY_CARE_PROVIDER_SITE_OTHER): Payer: Self-pay | Admitting: Internal Medicine

## 2021-05-09 ENCOUNTER — Ambulatory Visit (INDEPENDENT_AMBULATORY_CARE_PROVIDER_SITE_OTHER): Payer: Medicare Other | Admitting: Internal Medicine

## 2021-05-09 ENCOUNTER — Other Ambulatory Visit (INDEPENDENT_AMBULATORY_CARE_PROVIDER_SITE_OTHER): Payer: Self-pay | Admitting: Internal Medicine

## 2021-05-09 DIAGNOSIS — I10 Essential (primary) hypertension: Secondary | ICD-10-CM

## 2021-05-09 DIAGNOSIS — I5032 Chronic diastolic (congestive) heart failure: Secondary | ICD-10-CM

## 2021-05-09 DIAGNOSIS — E559 Vitamin D deficiency, unspecified: Secondary | ICD-10-CM

## 2021-05-09 DIAGNOSIS — E785 Hyperlipidemia, unspecified: Secondary | ICD-10-CM

## 2021-05-09 DIAGNOSIS — J4540 Moderate persistent asthma, uncomplicated: Secondary | ICD-10-CM | POA: Diagnosis not present

## 2021-05-09 DIAGNOSIS — E119 Type 2 diabetes mellitus without complications: Secondary | ICD-10-CM

## 2021-05-09 MED ORDER — METFORMIN HCL 500 MG PO TABS: 1000.0000 mg | ORAL_TABLET | Freq: Two times a day (BID) | ORAL | 1 refills | Status: DC

## 2021-05-09 NOTE — Progress Notes (Signed)
Metrics: Intervention Frequency ACO  Documented Smoking Status Yearly  Screened one or more times in 24 months  Cessation Counseling or  Active cessation medication Past 24 months  Past 24 months   Guideline developer: UpToDate (See UpToDate for funding source) Date Released: 2014       Wellness Office Visit  Subjective:  Patient ID: Erin Sexton, female    DOB: 02-Oct-1971  Age: 50 y.o. MRN: [id number]  CC: This lady comes in for follow-up of diabetes, hypertension, morbid obesity, hyperlipidemia. HPI  She continues to do well with keeping to a nutritional plan.  Although she has not lost any weight since the last time I saw her, she has not gained any weight.  She has tolerated the small amount of NP thyroid 30 mg tablet in the afternoon. She saw her gynecologist yesterday and he wants to still continue with her taking Megace. She continues with metformin, Jardiance and glipizide for her diabetes Past Medical History:  Diagnosis Date  . Abnormal CT scan    a. 02/2014: evaluated for dyspnea and initially placed on Xarelto for PE because radiologist could not exclude small peripheral pulmonary emboli because of limited contrast. However, after admission for ++vaginal bleeding several days later, it was felt she did NOT have PE - underwent further testing with neg VQ 03/06/14, neg LE duplex.  . Anemia   . Anxiety   . Asthma   . Blood transfusion without reported diagnosis   . Bone spur of right foot   . CHF (congestive heart failure) ([place])   . COPD (chronic obstructive pulmonary disease) ([place])   . Depression   . Diabetes mellitus without complication ([place])    a. Dx 02/2014.  [name] History of echocardiogram 2015   normal EF, Grade II diastolic dysfunction  . HOCM (hypertrophic obstructive cardiomyopathy) ([place])    a. Dx 40981.  [name] Hypertension   . Morbid obesity ([place])   . NSVT (nonsustained ventricular tachycardia) ([place])    a. Noted on tele during 02/2014 adm for symptomatic anemia.   [name] SVT (supraventricular tachycardia) ([place])    a. Noted on tele during 02/2014 adm for symptomatic anemia.  . Vaginal bleeding    Past Surgical History:  Procedure Laterality Date  . COLONOSCOPY N/A 09/30/2019   Procedure: COLONOSCOPY;  Surgeon: Rogene Houston, MD;  Location: [facility];  Service: Endoscopy;  Laterality: N/A;  730  . DILITATION & CURRETTAGE/HYSTROSCOPY WITH NOVASURE ABLATION N/A 08/07/2016   Procedure: DILATATION & CURETTAGE/HYSTEROSCOPY WITH ATTPEMPTED NOVASURE ABLATION AND INSERTION OF IUD;  Surgeon: Florian Buff, MD;  Location: [facility];  Service: Gynecology;  Laterality: N/A;     Family History  Problem Relation Age of Onset  . Diabetes Mother   . Arthritis Mother   . Other Mother        WPW  . Mental illness Mother        Depression  . Heart disease Father        History of coronary bypass grafting at age 90  . Mental illness Sister   . Bipolar disorder Sister   . Heart disease Sister        possibly WPW  . Uterine cancer Maternal Grandmother   . Mental illness Maternal Grandmother   . Heart disease Maternal Grandfather   . CAD Neg Hx        No family history of EARLY CAD    Social History   Social History Narrative   Divorced for 3 years,married  for 5 years.On disability since 2009 secondary to mental illness.   Social History   Tobacco Use  . Smoking status: Never Smoker  . Smokeless tobacco: Never Used  Substance Use Topics  . Alcohol use: No    Current Meds  Medication Sig  . ACCU-CHEK GUIDE test strip USE 1 STRIP TO CHECK GLUCOSE ONCE DAILY  . Accu-Chek Softclix Lancets lancets USE 1 LANCET TO CHECK GLUCOSE ONCE DAILY  . albuterol (PROVENTIL) (5 MG/ML) 0.5% nebulizer solution Take 0.5 mLs (2.5 mg total) by nebulization every 6 (six) hours as needed for wheezing or shortness of breath.  [name] albuterol (VENTOLIN HFA) 108 (90 Base) MCG/ACT inhaler Inhale 2 puffs into the lungs as needed (wheezing).   [name] atorvastatin (LIPITOR) 80 MG tablet  Take 1 tablet by mouth once daily  . blood glucose meter kit and supplies KIT Dispense based on patient and insurance preference. Use once a day. Diagnosis code:E11.9  . buPROPion (WELLBUTRIN XL) 300 MG 24 hr tablet Take 1 tablet by mouth once daily  . cetirizine (ZYRTEC ALLERGY) 10 MG tablet Take 1 tablet (10 mg total) by mouth daily. (Patient taking differently: Take 10 mg by mouth.)  . Cholecalciferol (VITAMIN D3 PO) Take 10,000 Units by mouth daily.  . clindamycin (CLEOCIN T) 1 % lotion APPLY TO AFFECTED AREAS OF BOILS TWICE DAILY AS NEEDED  . clotrimazole-betamethasone (LOTRISONE) cream APPLY CREAM TOPICALLY TWICE DAILY  . estradiol (ESTRACE) 2 MG tablet Take 1 tablet (2 mg total) by mouth at bedtime.  [name] glipiZIDE (GLUCOTROL) 5 MG tablet TAKE 1 TABLET BY MOUTH TWICE DAILY BEFORE A MEAL  . JARDIANCE 10 MG TABS tablet Take 1 tablet (10 mg total) by mouth daily.  Elmore Guise Device MISC Use to check your blood sugar daily  . levonorgestrel (MIRENA) 20 MCG/24HR IUD 1 each by Intrauterine route once.  [name] lisinopril (ZESTRIL) 2.5 MG tablet Take 1 tablet by mouth once daily  . megestrol (MEGACE) 40 MG tablet Take 1 tablet by mouth once daily  . metoprolol tartrate (LOPRESSOR) 50 MG tablet Take 1 tablet by mouth twice daily  . NP THYROID 120 MG tablet Take 1 tablet (120 mg total) by mouth daily before breakfast.  . potassium chloride SA (KLOR-CON) 20 MEQ tablet Take 1 tablet by mouth twice daily  . SYMBICORT 160-4.5 MCG/ACT inhaler Inhale 2 puffs into the lungs as needed.  . thyroid (NP THYROID) 30 MG tablet Take 1 tablet (30 mg total) by mouth daily before breakfast.  . torsemide (DEMADEX) 20 MG tablet Take 1 tablet by mouth twice daily  . [DISCONTINUED] metFORMIN (GLUCOPHAGE) 500 MG tablet Take 1 tablet by mouth twice daily     [facility] Office Visit from 05/08/2021 in [facility]  PHQ-9 Total Score 12      Objective:   Today's Vitals: BP 119/67 (BP Location: Right Arm,  Patient Position: Sitting, Cuff Size: Large)   Pulse 78   Temp 97.6 F (36.4 C) (Temporal)   Resp 18   Ht _0  (1.727 m)   Wt (!) 311 lb (141.1 kg)   SpO2 98%   BMI 47.29 kg/m  Vitals with BMI 05/09/2021 05/08/2021 04/03/2021  Height _1  _2  _3   Weight 311 lbs 313 lbs 311 lbs  BMI 47.3 78.6 76.72  Systolic 094 709 628  Diastolic 67 86 70  Pulse 78 78 75  Some encounter information is confidential and restricted. Go to Review Flowsheets activity to see all data.  Physical Exam  Although she remains morbidly obese, she continues to maintain her weight loss.  Blood pressure is excellent.     Assessment   1. Diabetes mellitus type 2, noninsulin dependent ([place])   2. Essential hypertension, benign   3. Vitamin D deficiency disease   4. Hyperlipidemia, unspecified hyperlipidemia type   5. Moderate persistent chronic asthma without complication   6. Chronic diastolic CHF (congestive heart failure) (HCC)       Tests ordered No orders of the defined types were placed in this encounter.    Plan: 1. As far as her diabetes is concerned, I want to remove glipizide from her medication list so she will stop taking this now and increase metformin to 1000 mg twice a day if tolerated.  She will continue with Jardiance. 2. We also want to optimize thyroid function to help with insulin resistance.  She will continue to take NP thyroid 120 mg in the morning but I have given her samples such that she will take NP thyroid 60 mg in the afternoon for 1 month, then 90 mg tablets in the afternoon for another month and then NP thyroid 120 mg in the afternoon so that she will be eventually taking NP thyroid 120 mg twice a day.  Hopefully she will tolerate this gradual increase. 3. I will see her in about 3 months time for follow-up and we will do the blood work then.   Meds ordered this encounter  Medications  . metFORMIN (GLUCOPHAGE) 500 MG tablet    Sig: Take 2 tablets (1,000 mg  total) by mouth 2 (two) times daily.    Dispense:  360 tablet    Refill:  1    Maurya Nethery Luther Parody, MD

## 2021-05-09 NOTE — Progress Notes (Signed)
Will need to send new refills to mail to [facility].

## 2021-05-10 ENCOUNTER — Other Ambulatory Visit (INDEPENDENT_AMBULATORY_CARE_PROVIDER_SITE_OTHER): Payer: Self-pay | Admitting: Internal Medicine

## 2021-05-10 LAB — CYTOLOGY - PAP
Comment: NEGATIVE
Diagnosis: NEGATIVE
High risk HPV: NEGATIVE

## 2021-05-15 ENCOUNTER — Other Ambulatory Visit (INDEPENDENT_AMBULATORY_CARE_PROVIDER_SITE_OTHER): Payer: Self-pay | Admitting: Internal Medicine

## 2021-05-15 DIAGNOSIS — E785 Hyperlipidemia, unspecified: Secondary | ICD-10-CM

## 2021-05-15 DIAGNOSIS — I10 Essential (primary) hypertension: Secondary | ICD-10-CM

## 2021-05-15 DIAGNOSIS — J4540 Moderate persistent asthma, uncomplicated: Secondary | ICD-10-CM

## 2021-05-15 DIAGNOSIS — E119 Type 2 diabetes mellitus without complications: Secondary | ICD-10-CM

## 2021-05-15 DIAGNOSIS — E559 Vitamin D deficiency, unspecified: Secondary | ICD-10-CM

## 2021-05-15 DIAGNOSIS — I5032 Chronic diastolic (congestive) heart failure: Secondary | ICD-10-CM

## 2021-05-15 MED ORDER — POTASSIUM CHLORIDE CRYS ER 20 MEQ PO TBCR: 20.0000 meq | EXTENDED_RELEASE_TABLET | Freq: Two times a day (BID) | ORAL | 3 refills | Status: DC

## 2021-05-23 ENCOUNTER — Other Ambulatory Visit (INDEPENDENT_AMBULATORY_CARE_PROVIDER_SITE_OTHER): Payer: Self-pay | Admitting: Internal Medicine

## 2021-05-23 DIAGNOSIS — E559 Vitamin D deficiency, unspecified: Secondary | ICD-10-CM

## 2021-05-23 DIAGNOSIS — E785 Hyperlipidemia, unspecified: Secondary | ICD-10-CM

## 2021-05-23 DIAGNOSIS — I5032 Chronic diastolic (congestive) heart failure: Secondary | ICD-10-CM

## 2021-05-23 DIAGNOSIS — I10 Essential (primary) hypertension: Secondary | ICD-10-CM

## 2021-05-23 DIAGNOSIS — E119 Type 2 diabetes mellitus without complications: Secondary | ICD-10-CM

## 2021-05-23 DIAGNOSIS — J4540 Moderate persistent asthma, uncomplicated: Secondary | ICD-10-CM

## 2021-05-29 ENCOUNTER — Other Ambulatory Visit (INDEPENDENT_AMBULATORY_CARE_PROVIDER_SITE_OTHER): Payer: Self-pay

## 2021-05-29 ENCOUNTER — Telehealth (INDEPENDENT_AMBULATORY_CARE_PROVIDER_SITE_OTHER): Payer: Self-pay

## 2021-05-29 DIAGNOSIS — I10 Essential (primary) hypertension: Secondary | ICD-10-CM

## 2021-05-29 DIAGNOSIS — E559 Vitamin D deficiency, unspecified: Secondary | ICD-10-CM

## 2021-05-29 DIAGNOSIS — J4540 Moderate persistent asthma, uncomplicated: Secondary | ICD-10-CM

## 2021-05-29 DIAGNOSIS — E785 Hyperlipidemia, unspecified: Secondary | ICD-10-CM

## 2021-05-29 DIAGNOSIS — E119 Type 2 diabetes mellitus without complications: Secondary | ICD-10-CM

## 2021-05-29 DIAGNOSIS — I5032 Chronic diastolic (congestive) heart failure: Secondary | ICD-10-CM

## 2021-05-29 MED ORDER — POTASSIUM CHLORIDE CRYS ER 20 MEQ PO TBCR
20.0000 meq | EXTENDED_RELEASE_TABLET | Freq: Two times a day (BID) | ORAL | 3 refills | Status: DC
Start: 2021-05-29 — End: 2021-08-02

## 2021-05-29 NOTE — Telephone Encounter (Signed)
What medication

## 2021-05-29 NOTE — Telephone Encounter (Signed)
Dup fax; dis Regaud.

## 2021-06-17 DIAGNOSIS — Z20822 Contact with and (suspected) exposure to covid-19: Secondary | ICD-10-CM | POA: Diagnosis not present

## 2021-06-18 ENCOUNTER — Other Ambulatory Visit (INDEPENDENT_AMBULATORY_CARE_PROVIDER_SITE_OTHER): Payer: Self-pay

## 2021-06-19 ENCOUNTER — Other Ambulatory Visit (INDEPENDENT_AMBULATORY_CARE_PROVIDER_SITE_OTHER): Payer: Self-pay | Admitting: Internal Medicine

## 2021-06-19 DIAGNOSIS — E785 Hyperlipidemia, unspecified: Secondary | ICD-10-CM

## 2021-06-19 DIAGNOSIS — J4540 Moderate persistent asthma, uncomplicated: Secondary | ICD-10-CM

## 2021-06-19 DIAGNOSIS — I5032 Chronic diastolic (congestive) heart failure: Secondary | ICD-10-CM

## 2021-06-19 DIAGNOSIS — E559 Vitamin D deficiency, unspecified: Secondary | ICD-10-CM

## 2021-06-19 DIAGNOSIS — I10 Essential (primary) hypertension: Secondary | ICD-10-CM

## 2021-06-19 DIAGNOSIS — E119 Type 2 diabetes mellitus without complications: Secondary | ICD-10-CM

## 2021-06-19 MED ORDER — JARDIANCE 10 MG PO TABS: 10.0000 mg | ORAL_TABLET | Freq: Every day | ORAL | 3 refills | Status: DC

## 2021-06-27 ENCOUNTER — Other Ambulatory Visit (HOSPITAL_COMMUNITY): Payer: Self-pay | Admitting: Internal Medicine

## 2021-06-27 DIAGNOSIS — Z1231 Encounter for screening mammogram for malignant neoplasm of breast: Secondary | ICD-10-CM

## 2021-07-03 ENCOUNTER — Other Ambulatory Visit (INDEPENDENT_AMBULATORY_CARE_PROVIDER_SITE_OTHER): Payer: Self-pay | Admitting: Internal Medicine

## 2021-07-03 DIAGNOSIS — I10 Essential (primary) hypertension: Secondary | ICD-10-CM

## 2021-07-03 DIAGNOSIS — E785 Hyperlipidemia, unspecified: Secondary | ICD-10-CM

## 2021-07-03 DIAGNOSIS — J4540 Moderate persistent asthma, uncomplicated: Secondary | ICD-10-CM

## 2021-07-03 DIAGNOSIS — I5032 Chronic diastolic (congestive) heart failure: Secondary | ICD-10-CM

## 2021-07-03 DIAGNOSIS — E559 Vitamin D deficiency, unspecified: Secondary | ICD-10-CM

## 2021-07-03 DIAGNOSIS — E119 Type 2 diabetes mellitus without complications: Secondary | ICD-10-CM

## 2021-07-12 ENCOUNTER — Encounter (INDEPENDENT_AMBULATORY_CARE_PROVIDER_SITE_OTHER): Payer: Self-pay | Admitting: Nurse Practitioner

## 2021-07-19 ENCOUNTER — Other Ambulatory Visit (INDEPENDENT_AMBULATORY_CARE_PROVIDER_SITE_OTHER): Payer: Self-pay | Admitting: Nurse Practitioner

## 2021-07-19 DIAGNOSIS — E119 Type 2 diabetes mellitus without complications: Secondary | ICD-10-CM

## 2021-07-19 MED ORDER — JARDIANCE 10 MG PO TABS: 10.0000 mg | ORAL_TABLET | Freq: Every day | ORAL | 0 refills | Status: DC

## 2021-07-23 ENCOUNTER — Other Ambulatory Visit (INDEPENDENT_AMBULATORY_CARE_PROVIDER_SITE_OTHER): Payer: Self-pay

## 2021-07-23 ENCOUNTER — Telehealth (INDEPENDENT_AMBULATORY_CARE_PROVIDER_SITE_OTHER): Payer: Self-pay

## 2021-07-23 DIAGNOSIS — E119 Type 2 diabetes mellitus without complications: Secondary | ICD-10-CM

## 2021-07-25 ENCOUNTER — Telehealth (INDEPENDENT_AMBULATORY_CARE_PROVIDER_SITE_OTHER): Payer: Self-pay

## 2021-07-25 NOTE — Telephone Encounter (Signed)
Received a fax from [facility] in [place] for a refill of the following that looks like it was sent to [place] and I do not see this pharmacy listed on the pharmacy list:  JARDIANCE 10 MG TABS tablet

## 2021-07-26 ENCOUNTER — Other Ambulatory Visit (INDEPENDENT_AMBULATORY_CARE_PROVIDER_SITE_OTHER): Payer: Self-pay | Admitting: Nurse Practitioner

## 2021-07-26 DIAGNOSIS — E119 Type 2 diabetes mellitus without complications: Secondary | ICD-10-CM

## 2021-07-26 DIAGNOSIS — E559 Vitamin D deficiency, unspecified: Secondary | ICD-10-CM

## 2021-07-26 DIAGNOSIS — E785 Hyperlipidemia, unspecified: Secondary | ICD-10-CM

## 2021-07-26 DIAGNOSIS — I10 Essential (primary) hypertension: Secondary | ICD-10-CM

## 2021-07-26 DIAGNOSIS — I5032 Chronic diastolic (congestive) heart failure: Secondary | ICD-10-CM

## 2021-07-26 DIAGNOSIS — J4540 Moderate persistent asthma, uncomplicated: Secondary | ICD-10-CM

## 2021-07-26 MED ORDER — METFORMIN HCL 500 MG PO TABS: 1000.0000 mg | ORAL_TABLET | Freq: Two times a day (BID) | ORAL | 0 refills | Status: DC

## 2021-07-26 MED ORDER — METOPROLOL TARTRATE 50 MG PO TABS: 50.0000 mg | ORAL_TABLET | Freq: Two times a day (BID) | ORAL | 0 refills | Status: DC

## 2021-07-26 MED ORDER — JARDIANCE 10 MG PO TABS: 10.0000 mg | ORAL_TABLET | Freq: Every day | ORAL | 0 refills | Status: DC

## 2021-07-26 MED ORDER — BUPROPION HCL ER (XL) 300 MG PO TB24: 300.0000 mg | ORAL_TABLET | Freq: Every day | ORAL | 0 refills | Status: DC

## 2021-07-26 MED ORDER — LISINOPRIL 2.5 MG PO TABS: 2.5000 mg | ORAL_TABLET | Freq: Every day | ORAL | 0 refills | Status: DC

## 2021-07-26 MED ORDER — ATORVASTATIN CALCIUM 80 MG PO TABS: 80.0000 mg | ORAL_TABLET | Freq: Every day | ORAL | 0 refills | Status: DC

## 2021-07-26 MED ORDER — TORSEMIDE 20 MG PO TABS: 20.0000 mg | ORAL_TABLET | Freq: Two times a day (BID) | ORAL | 0 refills | Status: DC

## 2021-08-01 ENCOUNTER — Other Ambulatory Visit: Payer: Self-pay

## 2021-08-01 ENCOUNTER — Encounter: Payer: Self-pay | Admitting: Nurse Practitioner

## 2021-08-01 ENCOUNTER — Ambulatory Visit (INDEPENDENT_AMBULATORY_CARE_PROVIDER_SITE_OTHER): Payer: Medicare Other | Admitting: Nurse Practitioner

## 2021-08-01 VITALS — BP 120/63 | HR 78 | Resp 18 | Ht 68.0 in | Wt 310.1 lb

## 2021-08-01 DIAGNOSIS — Z7689 Persons encountering health services in other specified circumstances: Secondary | ICD-10-CM | POA: Diagnosis not present

## 2021-08-01 DIAGNOSIS — E559 Vitamin D deficiency, unspecified: Secondary | ICD-10-CM | POA: Diagnosis not present

## 2021-08-01 DIAGNOSIS — I421 Obstructive hypertrophic cardiomyopathy: Secondary | ICD-10-CM | POA: Diagnosis not present

## 2021-08-01 DIAGNOSIS — N924 Excessive bleeding in the premenopausal period: Secondary | ICD-10-CM | POA: Diagnosis not present

## 2021-08-01 DIAGNOSIS — E039 Hypothyroidism, unspecified: Secondary | ICD-10-CM | POA: Diagnosis not present

## 2021-08-01 DIAGNOSIS — I10 Essential (primary) hypertension: Secondary | ICD-10-CM | POA: Diagnosis not present

## 2021-08-01 DIAGNOSIS — E119 Type 2 diabetes mellitus without complications: Secondary | ICD-10-CM

## 2021-08-01 NOTE — Assessment & Plan Note (Signed)
Last vitamin D Lab Results  Component Value Date   VD25OH 86 08/10/2020   -has been taking vit D supplement

## 2021-08-01 NOTE — Progress Notes (Signed)
New Patient Office Visit  Subjective:  Patient ID: Erin Sexton, female    DOB: [birth year]  Age: 50 y.o. MRN: [id number]  CC:  Chief Complaint  Patient presents with   Follow-up   Establish Care    HPI Erin Sexton presents for new patient visit. Transferring care from Dr. Anastasio Champion. Last physical was over a year ago. Last labs were drawn over 3 months ago.  She is followed by cardiology for heart failure.  Followed by Dr. Elonda Husky for GYN needs.   Past Medical History:  Diagnosis Date   Abnormal CT scan    a. 02/2014: evaluated for dyspnea and initially placed on Xarelto for PE because radiologist could not exclude small peripheral pulmonary emboli because of limited contrast. However, after admission for ++vaginal bleeding several days later, it was felt she did NOT have PE - underwent further testing with neg VQ 03/06/14, neg LE duplex.   Anemia    Anxiety    Asthma    Blood transfusion without reported diagnosis    Bone spur of right foot    CHF (congestive heart failure) (HCC)    COPD (chronic obstructive pulmonary disease) ([place])    Depression    Diabetes mellitus without complication ([place])    a. Dx 02/2014.   History of echocardiogram 2015   normal EF, Grade II diastolic dysfunction   HOCM (hypertrophic obstructive cardiomyopathy) ([place])    a. Dx [address].   Hypertension    Morbid obesity ([place])    NSVT (nonsustained ventricular tachycardia) ([place])    a. Noted on tele during 02/2014 adm for symptomatic anemia.   SVT (supraventricular tachycardia) ([place])    a. Noted on tele during 02/2014 adm for symptomatic anemia.   Vaginal bleeding     Past Surgical History:  Procedure Laterality Date   COLONOSCOPY N/A 09/30/2019   Procedure: COLONOSCOPY;  Surgeon: Rogene Houston, MD;  Location[facility];  Service: Endoscopy;  Laterality: N/A;  [place] N/A 08/07/2016   Procedure: DILATATION & CURETTAGE/HYSTEROSCOPY  WITH ATTPEMPTED NOVASURE ABLATION AND INSERTION OF IUD;  Surgeon: Florian Buff, MD;  Location: [facility];  Service: Gynecology;  Laterality: N/A;    Family History  Problem Relation Age of Onset   Diabetes Mother    Arthritis Mother    Other Mother        WPW   Mental illness Mother        Depression   Heart disease Father        History of coronary bypass grafting at age 58   Mental illness Sister    Bipolar disorder Sister    Heart disease Sister        possibly WPW   Uterine cancer Maternal Grandmother    Mental illness Maternal Grandmother    Heart disease Maternal Grandfather    CAD Neg Hx        No family history of EARLY CAD    Social History   Socioeconomic History   Marital status: Divorced    Spouse name: Not on file   Number of children: 0   Years of education: Not on file   Highest education level: Not on file  Occupational History   Occupation: Disabled  Tobacco Use   Smoking status: Never   Smokeless tobacco: Never  Vaping Use   Vaping Use: Never used  Substance and Sexual Activity   Alcohol use: No   Drug use: No  Sexual activity: Not Currently    Birth control/protection: I.U.D., Post-menopausal    Comment: IUD in place to control vaginal bleeding  Other Topics Concern   Not on file  Social History Narrative   Divorced for 3 years,married for 5 years.On disability since 2009 secondary to mental illness.   Social Determinants of Health   Financial Resource Strain: Medium Risk   Difficulty of Paying Living Expenses: Somewhat hard  Food Insecurity: No Food Insecurity   Worried About Charity fundraiser in the Last Year: Never true   Ran Out of Food in the Last Year: Never true  Transportation Needs: No Transportation Needs   Lack of Transportation (Medical): No   Lack of Transportation (Non-Medical): No  Physical Activity: Sufficiently Active   Days of Exercise per Week: 4 days   Minutes of Exercise per Session: 40 min  Stress: No Stress  Concern Present   Feeling of Stress : Only a little  Social Connections: Socially Isolated   Frequency of Communication with Friends and Family: Once a week   Frequency of Social Gatherings with Friends and Family: Never   Attends Religious Services: Never   Marine scientist or Organizations: No   Attends Music therapist: Never   Marital Status: Divorced  Human resources officer Violence: Not At Risk   Fear of Current or Ex-Partner: No   Emotionally Abused: No   Physically Abused: No   Sexually Abused: No    ROS Review of Systems  Constitutional: Negative.   Respiratory: Negative.    Cardiovascular: Negative.   Endocrine:       Running out of NP thyroid samples  Musculoskeletal: Negative.   Psychiatric/Behavioral: Negative.     Objective:   Today's Vitals: BP 120/63   Pulse 78   Resp 18   Ht $R'5\' 8"'Rx$  (1.727 m)   Wt (!) 310 lb 1.9 oz (140.7 kg)   SpO2 96%   BMI 47.15 kg/m   Physical Exam Constitutional:      Appearance: Normal appearance. She is obese.  Cardiovascular:     Rate and Rhythm: Normal rate and regular rhythm.     Pulses: Normal pulses.     Heart sounds: Normal heart sounds.  Pulmonary:     Effort: Pulmonary effort is normal.     Breath sounds: Normal breath sounds.  Abdominal:     Comments: Large, pendulous pannis  Musculoskeletal:        General: Normal range of motion.  Neurological:     Mental Status: She is alert.  Psychiatric:        Mood and Affect: Mood normal.        Behavior: Behavior normal.        Thought Content: Thought content normal.        Judgment: Judgment normal.    Assessment & Plan:   Problem List Items Addressed This Visit       Cardiovascular and Mediastinum   Essential hypertension, benign    BP Readings from Last 3 Encounters:  08/01/21 120/63  05/09/21 119/67  05/08/21 136/86  -she has been losing weight, and this helps -no change to antihypertensive regimen      Relevant Orders   CBC with  Differential/Platelet   Lipid Panel With LDL/HDL Ratio   CMP14+EGFR   HOCM (hypertrophic obstructive cardiomyopathy) ([place])    -followed by cardiology        Endocrine   Diabetes mellitus type 2, noninsulin dependent ([place])    Lab  Results  Component Value Date   HGBA1C 7.7 (H) 04/03/2021  -on statin and ACEi -taking metformin 1000 mg BID -taking jardiance; helps with CHF -consider adding GLP-1 for weight loss and glycemic benefits -check A1c with labs      Relevant Orders   AMB Referral to [facility]   Lipid Panel With LDL/HDL Ratio   CMP14+EGFR   Hemoglobin A1c     Other   Morbid obesity ([place])    Wt Readings from Last 3 Encounters:  08/01/21 (!) 310 lb 1.9 oz (140.7 kg)  05/09/21 (!) 311 lb (141.1 kg)  05/08/21 (!) 313 lb (142 kg)   BMI Readings from Last 3 Encounters:  08/01/21 47.15 kg/m  05/09/21 47.29 kg/m  05/08/21 47.59 kg/m  -weight stable over at least the last 3 months -she has large pannis and would like to consider surgery for her loose skin -gets rashes and uses lotrisone cream for intertriginous dermatitis      Menorrhagia    -followed by Dr. Elonda Husky -takes megace and estradiol      Vitamin D deficiency disease    Last vitamin D Lab Results  Component Value Date   VD25OH 5 08/10/2020  -has been taking vit D supplement      Relevant Orders   VITAMIN D 25 Hydroxy (Vit-D Deficiency, Fractures)   Other Visit Diagnoses     Encounter to establish care    -  Primary   Relevant Orders   CBC with Differential/Platelet   Lipid Panel With LDL/HDL Ratio   CMP14+EGFR   VITAMIN D 25 Hydroxy (Vit-D Deficiency, Fractures)   TSH+T4F+T3Free   Hypothyroidism, unspecified type       Relevant Orders   TSH+T4F+T3Free       Outpatient Encounter Medications as of 08/01/2021  Medication Sig   ACCU-CHEK GUIDE test strip USE 1 STRIP TO CHECK GLUCOSE ONCE DAILY   Accu-Chek Softclix Lancets lancets USE 1 LANCET TO CHECK GLUCOSE ONCE DAILY    albuterol (PROVENTIL) (5 MG/ML) 0.5% nebulizer solution Take 0.5 mLs (2.5 mg total) by nebulization every 6 (six) hours as needed for wheezing or shortness of breath.   albuterol (VENTOLIN HFA) 108 (90 Base) MCG/ACT inhaler Inhale 2 puffs into the lungs as needed (wheezing).    atorvastatin (LIPITOR) 80 MG tablet Take 1 tablet (80 mg total) by mouth daily.   buPROPion (WELLBUTRIN XL) 300 MG 24 hr tablet Take 1 tablet (300 mg total) by mouth daily.   cetirizine (ZYRTEC ALLERGY) 10 MG tablet Take 1 tablet (10 mg total) by mouth daily. (Patient taking differently: Take 10 mg by mouth.)   Cholecalciferol (VITAMIN D3 PO) Take 10,000 Units by mouth daily.   clindamycin (CLEOCIN T) 1 % lotion APPLY TO AFFECTED AREAS OF BOILS TWICE DAILY AS NEEDED   clotrimazole-betamethasone (LOTRISONE) cream APPLY CREAM TOPICALLY TWICE DAILY   estradiol (ESTRACE) 2 MG tablet Take 1 tablet (2 mg total) by mouth at bedtime.   JARDIANCE 10 MG TABS tablet Take 1 tablet (10 mg total) by mouth daily.   Lancet Device MISC Use to check your blood sugar daily   levonorgestrel (MIRENA) 20 MCG/24HR IUD 1 each by Intrauterine route once.   lisinopril (ZESTRIL) 2.5 MG tablet Take 1 tablet (2.5 mg total) by mouth daily.   megestrol (MEGACE) 40 MG tablet Take 1 tablet by mouth once daily   metFORMIN (GLUCOPHAGE) 500 MG tablet Take 2 tablets (1,000 mg total) by mouth 2 (two) times daily.   metoprolol tartrate (LOPRESSOR)  50 MG tablet Take 1 tablet (50 mg total) by mouth 2 (two) times daily.   NP THYROID 120 MG tablet TAKE 1 TABLET(120 MG) BY MOUTH DAILY BEFORE BREAKFAST   SYMBICORT 160-4.5 MCG/ACT inhaler Inhale 2 puffs into the lungs as needed.   torsemide (DEMADEX) 20 MG tablet Take 1 tablet (20 mg total) by mouth 2 (two) times daily.   blood glucose meter kit and supplies KIT Dispense based on patient and insurance preference. Use once a day. Diagnosis code:E11.9   potassium chloride SA (KLOR-CON) 20 MEQ tablet Take 1 tablet (20  mEq total) by mouth 2 (two) times daily.   [DISCONTINUED] glipiZIDE (GLUCOTROL) 5 MG tablet TAKE 1 TABLET BY MOUTH TWICE DAILY BEFORE A MEAL (Patient not taking: Reported on 08/01/2021)   [DISCONTINUED] thyroid (NP THYROID) 30 MG tablet Take 1 tablet (30 mg total) by mouth daily before breakfast.   No facility-administered encounter medications on file as of 08/01/2021.    Follow-up: Return in about 1 week (around 08/08/2021) for Lab follow-up (DM, thyroid).   [name], NP

## 2021-08-01 NOTE — Assessment & Plan Note (Signed)
BP Readings from Last 3 Encounters:  08/01/21 120/63  05/09/21 119/67  05/08/21 136/86   -she has been losing weight, and this helps -no change to antihypertensive regimen

## 2021-08-01 NOTE — Assessment & Plan Note (Signed)
-  followed by cardiology 

## 2021-08-01 NOTE — Assessment & Plan Note (Signed)
-  followed by Dr. Elonda Husky -takes megace and estradiol

## 2021-08-01 NOTE — Assessment & Plan Note (Signed)
Wt Readings from Last 3 Encounters:  08/01/21 (!) 310 lb 1.9 oz (140.7 kg)  05/09/21 (!) 311 lb (141.1 kg)  05/08/21 (!) 313 lb (142 kg)   BMI Readings from Last 3 Encounters:  08/01/21 47.15 kg/m  05/09/21 47.29 kg/m  05/08/21 47.59 kg/m   -weight stable over at least the last 3 months -she has large pannis and would like to consider surgery for her loose skin -gets rashes and uses lotrisone cream for intertriginous dermatitis

## 2021-08-01 NOTE — Assessment & Plan Note (Signed)
Lab Results  Component Value Date   HGBA1C 7.7 (H) 04/03/2021   -on statin and ACEi -taking metformin 1000 mg BID -taking jardiance; helps with CHF -consider adding GLP-1 for weight loss and glycemic benefits -check A1c with labs

## 2021-08-01 NOTE — Patient Instructions (Signed)
Please have labs drawn today

## 2021-08-02 ENCOUNTER — Other Ambulatory Visit: Payer: Self-pay | Admitting: Nurse Practitioner

## 2021-08-02 ENCOUNTER — Telehealth (INDEPENDENT_AMBULATORY_CARE_PROVIDER_SITE_OTHER): Payer: Self-pay

## 2021-08-02 ENCOUNTER — Encounter: Payer: Self-pay | Admitting: Nurse Practitioner

## 2021-08-02 ENCOUNTER — Telehealth: Payer: Self-pay | Admitting: *Deleted

## 2021-08-02 DIAGNOSIS — E785 Hyperlipidemia, unspecified: Secondary | ICD-10-CM

## 2021-08-02 DIAGNOSIS — E119 Type 2 diabetes mellitus without complications: Secondary | ICD-10-CM

## 2021-08-02 DIAGNOSIS — E039 Hypothyroidism, unspecified: Secondary | ICD-10-CM

## 2021-08-02 DIAGNOSIS — E559 Vitamin D deficiency, unspecified: Secondary | ICD-10-CM

## 2021-08-02 DIAGNOSIS — I10 Essential (primary) hypertension: Secondary | ICD-10-CM

## 2021-08-02 DIAGNOSIS — J4540 Moderate persistent asthma, uncomplicated: Secondary | ICD-10-CM

## 2021-08-02 DIAGNOSIS — I5032 Chronic diastolic (congestive) heart failure: Secondary | ICD-10-CM

## 2021-08-02 LAB — CBC WITH DIFFERENTIAL/PLATELET
Basophils Absolute: 0.1 10*3/uL (ref 0.0–0.2)
Basos: 1 %
EOS (ABSOLUTE): 0.2 10*3/uL (ref 0.0–0.4)
Eos: 2 %
Hematocrit: 46.9 % — ABNORMAL HIGH (ref 34.0–46.6)
Hemoglobin: 16.5 g/dL — ABNORMAL HIGH (ref 11.1–15.9)
Immature Grans (Abs): 0 10*3/uL (ref 0.0–0.1)
Immature Granulocytes: 0 %
Lymphocytes Absolute: 2.5 10*3/uL (ref 0.7–3.1)
Lymphs: 22 %
MCH: 31.3 pg (ref 26.6–33.0)
MCHC: 35.2 g/dL (ref 31.5–35.7)
MCV: 89 fL (ref 79–97)
Monocytes Absolute: 0.5 10*3/uL (ref 0.1–0.9)
Monocytes: 5 %
Neutrophils Absolute: 8.2 10*3/uL — ABNORMAL HIGH (ref 1.4–7.0)
Neutrophils: 70 %
Platelets: 277 10*3/uL (ref 150–450)
RBC: 5.27 x10E6/uL (ref 3.77–5.28)
RDW: 12.2 % (ref 11.7–15.4)
WBC: 11.6 10*3/uL — ABNORMAL HIGH (ref 3.4–10.8)

## 2021-08-02 LAB — CMP14+EGFR
ALT: 19 IU/L (ref 0–32)
AST: 18 IU/L (ref 0–40)
Albumin/Globulin Ratio: 2 (ref 1.2–2.2)
Albumin: 4.3 g/dL (ref 3.8–4.9)
Alkaline Phosphatase: 124 IU/L — ABNORMAL HIGH (ref 44–121)
BUN/Creatinine Ratio: 15 (ref 9–23)
BUN: 18 mg/dL (ref 6–24)
Bilirubin Total: 0.6 mg/dL (ref 0.0–1.2)
CO2: 21 mmol/L (ref 20–29)
Calcium: 8.9 mg/dL (ref 8.7–10.2)
Chloride: 102 mmol/L (ref 96–106)
Creatinine, Ser: 1.21 mg/dL — ABNORMAL HIGH (ref 0.57–1.00)
Globulin, Total: 2.2 g/dL (ref 1.5–4.5)
Glucose: 189 mg/dL — ABNORMAL HIGH (ref 65–99)
Potassium: 3.7 mmol/L (ref 3.5–5.2)
Sodium: 141 mmol/L (ref 134–144)
Total Protein: 6.5 g/dL (ref 6.0–8.5)
eGFR: 54 mL/min/{1.73_m2} — ABNORMAL LOW (ref >59)

## 2021-08-02 LAB — VITAMIN D 25 HYDROXY (VIT D DEFICIENCY, FRACTURES): Vit D, 25-Hydroxy: 95.7 ng/mL (ref 30.0–100.0)

## 2021-08-02 LAB — TSH+T4F+T3FREE
Free T4: 1.84 ng/dL — ABNORMAL HIGH (ref 0.82–1.77)
T3, Free: 6.5 pg/mL — ABNORMAL HIGH (ref 2.0–4.4)
TSH: 0.981 u[IU]/mL (ref 0.450–4.500)

## 2021-08-02 LAB — LIPID PANEL WITH LDL/HDL RATIO
Cholesterol, Total: 101 mg/dL (ref 100–199)
HDL: 33 mg/dL — ABNORMAL LOW (ref >39)
LDL Chol Calc (NIH): 32 mg/dL (ref 0–99)
LDL/HDL Ratio: 1 ratio (ref 0.0–3.2)
Triglycerides: 233 mg/dL — ABNORMAL HIGH (ref 0–149)
VLDL Cholesterol Cal: 36 mg/dL (ref 5–40)

## 2021-08-02 LAB — HEMOGLOBIN A1C
Est. average glucose Bld gHb Est-mCnc: 192 mg/dL
Hgb A1c MFr Bld: 8.3 % — ABNORMAL HIGH (ref 4.8–5.6)

## 2021-08-02 MED ORDER — LEVOTHYROXINE SODIUM 150 MCG PO TABS
150.0000 ug | ORAL_TABLET | Freq: Every day | ORAL | 3 refills | Status: DC
Start: 2021-08-02 — End: 2022-08-09

## 2021-08-02 MED ORDER — POTASSIUM CHLORIDE CRYS ER 20 MEQ PO TBCR: 20.0000 meq | EXTENDED_RELEASE_TABLET | Freq: Two times a day (BID) | ORAL | 2 refills | Status: DC

## 2021-08-02 NOTE — Progress Notes (Signed)
A1c is elevated at 8.3. Thyroid hormones are elevated. Stop NP thyroid, and I am sending in levothyroxine 150 mcg daily and we will recheck in 1 month.  We will discuss diabetes medications at her appointment in a week.

## 2021-08-02 NOTE — Chronic Care Management (AMB) (Signed)
  Chronic Care Management   Note  08/02/2021 Name: Erin Sexton MRN: [id number] DOB: 1971-04-26  [name] is a 50 y.o. year old female who is a primary care patient of [name], NP. I reached out to [name] by phone today in response to a referral sent by [name]'s PCP, [name], NP.      [name] was given information about Chronic Care Management services today including:  CCM service includes personalized support from designated clinical staff supervised by her physician, including individualized plan of care and coordination with other care providers 24/7 contact phone numbers for assistance for urgent and routine care needs. Service will only be billed when office clinical staff spend 20 minutes or more in a month to coordinate care. Only one practitioner may furnish and bill the service in a calendar month. The patient may stop CCM services at any time (effective at the end of the month) by phone call to the office staff. The patient will be responsible for cost sharing (co-pay) of up to 20% of the service fee (after annual deductible is met).  Patient agreed to services and verbal consent obtained.   Follow up plan: Face to Face appointment with care management team member scheduled for: 08/08/21  [facility]  Direct Dial: [phone]

## 2021-08-02 NOTE — Progress Notes (Unsigned)
-  swap NP thyroid to levothyroxine -recheck labs in 1 month

## 2021-08-02 NOTE — Telephone Encounter (Signed)
Received a refill request from [facility] on [address] in [place] for the following medication:  potassium chloride SA (KLOR-CON) 20 MEQ tablet  Last filled 05/29/2021, # 60 with 3 refills

## 2021-08-08 ENCOUNTER — Other Ambulatory Visit: Payer: Self-pay

## 2021-08-08 ENCOUNTER — Encounter: Payer: Self-pay | Admitting: Nurse Practitioner

## 2021-08-08 ENCOUNTER — Ambulatory Visit (INDEPENDENT_AMBULATORY_CARE_PROVIDER_SITE_OTHER): Payer: Medicare Other | Admitting: Pharmacist

## 2021-08-08 ENCOUNTER — Ambulatory Visit (INDEPENDENT_AMBULATORY_CARE_PROVIDER_SITE_OTHER): Payer: Medicare Other | Admitting: Nurse Practitioner

## 2021-08-08 VITALS — BP 127/70 | HR 70 | Temp 98.3°F | Ht 69.0 in | Wt 313.0 lb

## 2021-08-08 DIAGNOSIS — N182 Chronic kidney disease, stage 2 (mild): Secondary | ICD-10-CM

## 2021-08-08 DIAGNOSIS — R0981 Nasal congestion: Secondary | ICD-10-CM

## 2021-08-08 DIAGNOSIS — E119 Type 2 diabetes mellitus without complications: Secondary | ICD-10-CM | POA: Diagnosis not present

## 2021-08-08 DIAGNOSIS — J45909 Unspecified asthma, uncomplicated: Secondary | ICD-10-CM

## 2021-08-08 DIAGNOSIS — I10 Essential (primary) hypertension: Secondary | ICD-10-CM

## 2021-08-08 DIAGNOSIS — G459 Transient cerebral ischemic attack, unspecified: Secondary | ICD-10-CM

## 2021-08-08 DIAGNOSIS — E039 Hypothyroidism, unspecified: Secondary | ICD-10-CM

## 2021-08-08 DIAGNOSIS — I5032 Chronic diastolic (congestive) heart failure: Secondary | ICD-10-CM | POA: Diagnosis not present

## 2021-08-08 DIAGNOSIS — M7989 Other specified soft tissue disorders: Secondary | ICD-10-CM

## 2021-08-08 MED ORDER — NOREL AD 4-10-325 MG PO TABS: 1.0000 | ORAL_TABLET | ORAL | 1 refills | Status: DC | PRN

## 2021-08-08 MED ORDER — ACCU-CHEK SOFTCLIX LANCETS MISC: 3 refills | Status: DC

## 2021-08-08 MED ORDER — ACCU-CHEK GUIDE VI STRP: ORAL_STRIP | 3 refills | Status: DC

## 2021-08-08 MED ORDER — TRULICITY 1.5 MG/0.5ML ~~LOC~~ SOAJ: 1.5000 mg | SUBCUTANEOUS | 1 refills | Status: DC

## 2021-08-08 MED ORDER — TRULICITY 0.75 MG/0.5ML ~~LOC~~ SOAJ: 0.7500 mg | SUBCUTANEOUS | 0 refills | Status: DC

## 2021-08-08 NOTE — Chronic Care Management (AMB) (Signed)
Chronic Care Management Pharmacy Note  08/08/2021 Name:  Erin Sexton MRN:  [id number] DOB:  03-04-1971  Recommendations made from today's visit:  Per discussion with PCP, will add dulaglutide (Trulicity) 4.31 mg subcutaneously weekly Consider addition of prescription or over the counter fish oil to lower triglycerides Consider adding spironolactone and discontinuing potassium supplementation for heart failure and chronic kidney disease  Subjective: Erin Sexton is an 50 y.o. year old female who is a primary patient of [name], NP.  The CCM team was consulted for assistance with disease management and care coordination needs.    Engaged with patient face to face for initial visit in response to provider referral for pharmacy case management and/or care coordination services.   Consent to Services:  The patient was given the following information about Chronic Care Management services today, agreed to services, and gave verbal consent: 1. CCM service includes personalized support from designated clinical staff supervised by the primary care provider, including individualized plan of care and coordination with other care providers 2. 24/7 contact phone numbers for assistance for urgent and routine care needs. 3. Service will only be billed when office clinical staff spend 20 minutes or more in a month to coordinate care. 4. Only one practitioner may furnish and bill the service in a calendar month. 5.The patient may stop CCM services at any time (effective at the end of the month) by phone call to the office staff. 6. The patient will be responsible for cost sharing (co-pay) of up to 20% of the service fee (after annual deductible is met). Patient agreed to services and consent obtained.  Patient Care Team: [name], NP as PCP - General (Nurse Practitioner) Herminio Commons, MD (Inactive) as PCP - Cardiology (Cardiology) Beryle Lathe, [facility]  (Pharmacist)  Objective:  Lab Results  Component Value Date   CREATININE 1.21 (H) 08/01/2021   CREATININE 1.28 (H) 04/03/2021   CREATININE 1.35 (H) 01/01/2021    Lab Results  Component Value Date   HGBA1C 8.3 (H) 08/01/2021   Last diabetic Eye exam:  Lab Results  Component Value Date/Time   HMDIABEYEEXA No Retinopathy 11/29/2020 12:00 AM    Last diabetic Foot exam: No results found for: HMDIABFOOTEX      Component Value Date/Time   CHOL 101 08/01/2021 0933   TRIG 233 (H) 08/01/2021 0933   HDL 33 (L) 08/01/2021 0933   CHOLHDL 2.7 06/27/2020 1355   VLDL 18 06/21/2016 0510   LDLCALC 32 08/01/2021 0933   LDLCALC 21 06/27/2020 1355    Hepatic Function Latest Ref Rng & Units 08/01/2021 01/01/2021 09/27/2020  Total Protein 6.0 - 8.5 g/dL 6.5 6.5 6.9  Albumin 3.8 - 4.9 g/dL 4.3 - -  AST 0 - 40 IU/L $Remov'18 18 17  'emIvfk$ ALT 0 - 32 IU/L $Remov'19 17 22  'xvJWxu$ Alk Phosphatase 44 - 121 IU/L 124(H) - -  Total Bilirubin 0.0 - 1.2 mg/dL 0.6 0.7 0.9  Bilirubin, Direct 0.0 - 0.3 mg/dL - - -    Lab Results  Component Value Date/Time   TSH 0.981 08/01/2021 09:33 AM   TSH 1.37 09/27/2020 02:17 PM   FREET4 1.84 (H) 08/01/2021 09:33 AM   FREET4 1.6 06/27/2020 01:55 PM    CBC Latest Ref Rng & Units 08/01/2021 01/31/2019 02/22/2017  WBC 3.4 - 10.8 x10E3/uL 11.6(H) 11.5(H) 6.5  Hemoglobin 11.1 - 15.9 g/dL 16.5(H) 14.4 13.6  Hematocrit 34.0 - 46.6 % 46.9(H) 43.4 39.0  Platelets 150 -  450 x10E3/uL 277 236 220    Lab Results  Component Value Date/Time   VD25OH 95.7 08/01/2021 09:33 AM   VD25OH 79 08/10/2020 02:57 PM    Clinical ASCVD: Yes  The ASCVD Risk score Mikey Bussing DC Jr., et al., 2013) failed to calculate for the following reasons:   The valid total cholesterol range is 130 to 320 mg/dL    Social History   Tobacco Use  Smoking Status Never  Smokeless Tobacco Never   BP Readings from Last 3 Encounters:  08/08/21 127/70  08/01/21 120/63  05/09/21 119/67   Pulse Readings from Last 3 Encounters:   08/08/21 70  08/01/21 78  05/09/21 78   Wt Readings from Last 3 Encounters:  08/08/21 (!) 313 lb (142 kg)  08/01/21 (!) 310 lb 1.9 oz (140.7 kg)  05/09/21 (!) 311 lb (141.1 kg)    Assessment: Review of patient past medical history, allergies, medications, health status, including review of consultants reports, laboratory and other test data, was performed as part of comprehensive evaluation and provision of chronic care management services.   SDOH:  (Social Determinants of Health) assessments and interventions performed:    CCM Care Plan  No Known Allergies  Medications Reviewed Today     Reviewed by Beryle Lathe, [facility] (Pharmacist) on 08/08/21 at [phone]  Med List Status: <None>   Medication Order Taking? Sig Documenting Provider Last Dose Status Informant  ACCU-CHEK GUIDE test strip [id number] Yes USE 1 STRIP TO CHECK GLUCOSE ONCE DAILY Anastasio Champion, Nimish C, MD Taking Active   Accu-Chek Softclix Lancets lancets [id number] Yes USE 1 LANCET TO CHECK GLUCOSE ONCE DAILY Gosrani, Nimish C, MD Taking Active   albuterol (PROVENTIL) (5 MG/ML) 0.5% nebulizer solution [id number] Yes Take 0.5 mLs (2.5 mg total) by nebulization every 6 (six) hours as needed for wheezing or shortness of breath. Ailene Ards, NP Taking Active   albuterol (VENTOLIN HFA) 108 (90 Base) MCG/ACT inhaler [id number] Yes Inhale 2 puffs into the lungs as needed (wheezing).  [provider] Taking Active Self           Med Note Vernell Barrier Aug 08, 2021  8:57 AM) Uses daily  atorvastatin (LIPITOR) 80 MG tablet [id number] Yes Take 1 tablet (80 mg total) by mouth daily. Ailene Ards, NP Taking Active   blood glucose meter kit and supplies KIT [id number] Yes Dispense based on patient and insurance preference. Use once a day. Diagnosis code:E11.9 Doree Albee, MD Taking Active   buPROPion (WELLBUTRIN XL) 300 MG 24 hr tablet [id number] Yes Take 1 tablet (300 mg total) by mouth daily. Ailene Ards, NP Taking Active   cetirizine [facility] ALLERGY) 10 MG tablet [id number] Yes Take 1 tablet (10 mg total) by mouth daily.  Patient taking differently: Take 10 mg by mouth.   Hall-Potvin, [place], PA-C Taking Active   Chlorphen-PE-Acetaminophen (NOREL AD) 4-10-325 MG TABS [id number] No Take 1 tablet by mouth every 4 (four) hours as needed (nasal congestion, cold symptoms).  Patient not taking: Reported on 08/08/2021   [name], NP Not Taking Active   Cholecalciferol (VITAMIN D3 PO) [id number] Yes Take 10,000 Units by mouth daily. [provider] Taking Active   clindamycin (CLEOCIN T) 1 % lotion [id number] No APPLY TO AFFECTED AREAS OF BOILS TWICE DAILY AS NEEDED  Patient not taking: Reported on 08/08/2021   [provider] Not Taking Active   clotrimazole-betamethasone (LOTRISONE) cream [id number] Yes APPLY CREAM TOPICALLY TWICE DAILY  Wilson Singer, MD Taking Active   Dulaglutide (TRULICITY) 0.75 MG/0.5ML SOPN [id number] No Inject 0.75 mg into the skin once a week.  Patient not taking: Reported on 08/08/2021   Heather Roberts, NP Not Taking Active   Dulaglutide (TRULICITY) 1.5 MG/0.5ML [facility] [id number] No Inject 1.5 mg into the skin once a week.  Patient not taking: Reported on 08/08/2021   Heather Roberts, NP Not Taking Active   estradiol (ESTRACE) 2 MG tablet [id number] Yes Take 1 tablet (2 mg total) by mouth at bedtime. Lazaro Arms, MD Taking Active   JARDIANCE 10 MG TABS tablet [id number] Yes Take 1 tablet (10 mg total) by mouth daily. Elenore Paddy, NP Taking Active   Lancet Device MISC [id number]  Use to check your blood sugar daily Elenore Paddy, NP  Active   levonorgestrel [facility]) 20 MCG/24HR IUD [id number] Yes 1 each by Intrauterine route once. [provider] Taking Active   levothyroxine (SYNTHROID) 150 MCG tablet [id number] Yes Take 1 tablet (150 mcg total) by mouth daily. Heather Roberts, NP Taking Active   lisinopril (ZESTRIL) 2.5 MG tablet [id number] Yes  Take 1 tablet (2.5 mg total) by mouth daily. Elenore Paddy, NP Taking Active   megestrol (MEGACE) 40 MG tablet [id number] Yes Take 1 tablet by mouth once daily Lazaro Arms, MD Taking Active   metFORMIN (GLUCOPHAGE) 500 MG tablet [id number] Yes Take 2 tablets (1,000 mg total) by mouth 2 (two) times daily. Elenore Paddy, NP Taking Active   metoprolol tartrate (LOPRESSOR) 50 MG tablet [id number] Yes Take 1 tablet (50 mg total) by mouth 2 (two) times daily. Elenore Paddy, NP Taking Active   potassium chloride SA (KLOR-CON) 20 MEQ tablet [id number] Yes Take 1 tablet (20 mEq total) by mouth 2 (two) times daily. Elenore Paddy, NP Taking Active   [facility] 160-4.5 MCG/ACT inhaler [id number] Yes Inhale 2 puffs into the lungs as needed. [provider] Taking Active Self  torsemide (DEMADEX) 20 MG tablet [id number] Yes Take 1 tablet (20 mg total) by mouth 2 (two) times daily. Elenore Paddy, NP Taking Active             Patient Active Problem List   Diagnosis Date Noted   Leg swelling 08/08/2021   Nasal congestion 08/08/2021   Hypothyroidism 08/08/2021   CRI (chronic renal insufficiency), stage 2 (mild) 10/24/2020   Vitamin D deficiency disease 07/25/2020   TIA (transient ischemic attack) 06/20/2016   Chronic diastolic CHF (congestive heart failure) (HCC) 06/20/2016   Complex endometrial hyperplasia without atypia 06/08/2014   Menorrhagia 06/01/2014   Physical deconditioning 05/25/2014   Acute on chronic diastolic congestive heart failure (HCC) 05/18/2014   Diabetes mellitus type 2, noninsulin dependent (HCC)    History of sleep apnea 03/21/2014   HOCM (hypertrophic obstructive cardiomyopathy) (HCC) 03/06/2014   Morbid obesity (HCC) 03/02/2014   Essential hypertension, benign 03/02/2014   Asthma, chronic 03/02/2014    Immunization History  Administered Date(s) Administered   Influenza Inj Mdck Quad Pf 09/11/2016, 09/08/2018   Influenza, Quadrivalent, Recombinant, Inj, Pf 09/15/2019    Influenza-Unspecified 09/09/2019   Pneumococcal Polysaccharide-23 08/10/2015, 09/27/2020   Td 12/09/2014    Conditions to be addressed/monitored: CHF, HTN, HLD, DMII, CKD Stage 3a, and asthma  Care Plan : Medication Management  Updates made by Gavin Pound, RPH since 08/08/2021 12:00 AM     Problem: HLD/TIA, CHF, HTN, T2DM, Asthma   Priority: High  Onset Date: 08/08/2021  Long-Range Goal: Disease Progression Prevention   Start Date: 08/08/2021  Expected End Date: 11/06/2021  This Visit's Progress: On track  Priority: High  Note:   Current Barriers:  Unable to achieve control of diabetes Suboptimal therapeutic regimen for heart failure , weight management, and chronic kidney disease   Pharmacist Clinical Goal(s):  Over the next 90  days, patient will Achieve control of diabetes as evidenced by improved A1c Adhere to plan to optimize therapeutic regimen for heart failure , weight management, and chronic kidney disease as evidenced by report of adherence to recommended medication management changes through collaboration with PharmD and provider.   Interventions: 1:1 collaboration with [name], NP regarding development and update of comprehensive plan of care as evidenced by provider attestation and co-signature Inter-disciplinary care team collaboration (see longitudinal plan of care) Comprehensive medication review performed; medication list updated in electronic medical record  Type 2 Diabetes: Current medications: metformin 1,000 mg by mouth  twice daily with meals and empagliflozin (Jardiance) 10 mg by mouth once daily Intolerances: none Taking medications as directed: yes Side effects thought to be attributed to current medication regimen: no Denies hypoglycemic/hyperglycemic symptoms Hypoglycemia prevention: not indicated at this time Current meal patterns: breakfast: does intermittent fasting and eats mostly baked/grilled chicken and vegetables;  drinks: water Current exercise:  walks every day at least 1 mile On a statin: $RemoveB'[x]'CfPfZZye$  Yes  $Re'[]'THw$  No    Last microalbumin: 12 (06/27/20); on an ACEi/ARB: $RemoveBefo'[x]'ZxgvBUxAHMV$  Yes  $Re'[]'VGq$  No    Last eye exam: goes every December per patient  Last foot exam: completed within last year by Dr. Anastasio Champion Pneumonia vaccine: up to date Current glucose readings: fasting blood glucose: within goal range of 80-130 mg/dL per ADA guidelines Uncontrolled; Most recent A1c above goal of <7% per ADA guidelines Introduction to diabetes basic facts Monitoring: target blood sugar range, when to test Continue metformin 1,000 mg by mouth  twice daily with meals and empagliflozin (Jardiance) 10 mg by mouth once daily Per discussion with PCP, will add dulaglutide (Trulicity) 6.27 mg subcutaneously weekly. Patient identified as a good candidate for a GLP-1 receptor agonist given reduction in cardiovascular disease, slowed chronic kidney disease progression, low risk of hypoglycemia, increased satiety , and weight loss. Patient denies a personal or family history of medullary thyroid carcinoma (MTC) or Multiple Endocrine Neoplasia syndrome type 2 (MEN 2). Patient also denies any history of pancreatitis or biliary disease. Encouraged regular aerobic exercise with a goal of 30 minutes five times per week (150 minutes per week) Patient was instructed on appropriate self administration technique for injection of dulaglutide (Trulicity)   Hyperlipidemia/History of TIA (2017): Current medications: atorvastatin 80 mg by mouth once daily Intolerances: none Taking medications as directed: yes Side effects thought to be attributed to current medication regimen: no Controlled; LDL at goal of <70 due to very high risk given established clinical ASCVD (TIA in 2017) per 2020 AACE/ACE guidelines and TG above goal of <150 per 2020 AACE/ACE guidelines Continue atorvastatin 80 mg by mouth once daily Consider addition of prescription or over the counter fish oil to  lower triglycerides  Recommend regular aerobic exercise Discussed need for and importance of continued work on weight loss Discussed need for medication compliance Patient plans to discuss whether or not she needs low dose aspirin for secondary prevention  Overweight/Obesity Unable to achieve goal weight loss through lifestyle modification alone Current treatment:  none   Medications/Strategies previously tried:  none ; Fasting/Modified Fasting Baseline weight: 361 lbs;  most recent weight: 313 lbs Extensive dietary counseling including education on focus on lean proteins, fruits and vegetables, whole grains and increased fiber consumption, adequate hydration Add GLP-1 agonist as above Recommend diet modification to induce energy deficit of 500 kcal/day or greater Encouraged regular aerobic exercise with a goal of 30 minutes five times per week (150 minutes per week)  Heart failure with preserved ejection fraction (LVEF >50% with evidence of spontaneous or provokable increased LV filling pressures) & Hypertension: Suboptimally managed Current treatment: lisinopril 2.5 mg by mouth once daily, metoprolol tartrate 50 mg by mouth twice daily, empagliflozin (Jardiance) 10 mg by mouth once daily, and torsemide 20 mg twice daily Stage C (Symptomatic heart failure)/NYHA Class II (Slight limitation of physical activity. Comfortable at rest. Ordinary physical activity results in fatigue, palpitation, or dyspnea) Most recent echocardiogram was on in 2017? Current home blood pressure: does not have a machine to check Current home weights: cehcks daily (309 lbs on 08/08/21) Lower extremity edema: [x]  Yes (minimal)  []   No  []   Undetermined Jugular venous distention: []  Yes  []   No  [x]   Undetermined due to body habitus Ascites: []  Yes  [x]   No  []   Undetermined Denies dyspnea at rest or on exertion and reduced exercise capacity . Reports orthopnea (sleeps in recliner) Continue lisinopril 2.5 mg by mouth  once daily, metoprolol tartrate 50 mg by mouth twice daily, empagliflozin (Jardiance) 10 mg by mouth once daily, and torsemide 20 mg twice daily Consider adding spironolactone and discontinuing potassium supplementation Encourage dietary sodium restriction (<3 g/day) Educated on the importance of weighing daily. Patient aware to contact cardiology/primary care team if weight gain >3 lbs in 1 day or >5 lbs in 1 week  Asthma: Controlled Current treatment: albuterol metered dose (ProAir, Ventolin, Proventil) 1 puff by mouth as needed for shortness of breath and budesonide/formoterol (Symbicort HFA) 160-4.5 mcg 2 puffs by mouth once daily Continue albuterol metered dose (ProAir, Ventolin, Proventil) 1 puff by mouth as needed for shortness of breath and budesonide/formoterol (Symbicort HFA) 160-4.5 mcg 2 puffs by mouth once daily Recommend allergen avoidance (environmental control) Encouraged regular physical activity for general health benefits Reminded patient to rinse mouth with water and spit after using ICS containing inhaler to prevent thrush  Chronic Kidney Disease Stage 3a - GFR 45-59 (Mild to Moderately Reduced Function): Current medications: lisinopril 2.5 mg by mouth once daily, empagliflozin (Jardiance) 10 mg by mouth once daily, metformin 1,000 mg by mouth  twice daily, and atorvastatin 80 mg by mouth once daily Intolerances: none Taking medications as directed: yes Side effects thought to be attributed to current medication regimen: no Denies dizziness, lightheadedness, blurred vision, and headache Most recent GFR: 54 mL/min Most recent microalbumin: 12 (06/27/20) Continue lisinopril 2.5 mg by mouth once daily, empagliflozin (Jardiance) 10 mg by mouth once daily, metformin 1,000 mg by mouth  twice daily with meals, and atorvastatin 80 mg by mouth once daily Add GLP-1 agonist as above Consider addition of spironolactone Encouraged healthy diet including more fruits and  vegetables Discussed need for and importance of continued work on weight loss Discussed need for improved control of blood sugar  Patient Goals/Self-Care Activities Over the next 90  days, patient will:  Take medications as prescribed Check blood sugar once a day at the following times: fasting (at least 8 hours since last food consumption) and whenever patient experiences symptoms of hypo/hyperglycemia, document, and provide at future appointments Weigh daily, and contact provider if weight gain of  more than 3 lbs in 1 day or more than 5 lbs in 1 week Target a minimum of 150 minutes of moderate intensity exercise weekly  Follow Up Plan: Face to Face appointment with care management team member scheduled for: 09/05/21      Medication Assistance: None required.  Patient affirms current coverage meets needs. She has Medicare Extra Help.  Patient's preferred pharmacy is:  [place], [place] - [address] [place] [address] Phone: [phone] Fax: [place], [place] [address] [place] [address] Phone: [phone] Fax: [phone]  Follow Up:  Patient agrees to Care Plan and Follow-up.  Plan: Face to Face appointment with care management team member scheduled for: 09/05/21  Kennon Holter, PharmD Clinical Pharmacist [facility] [phone]

## 2021-08-08 NOTE — Assessment & Plan Note (Signed)
-  Rx. norel -if symptoms persist through the weekend, will consider abx

## 2021-08-08 NOTE — Assessment & Plan Note (Addendum)
-  INCREASE torsemide to 3 tabs daily for 1 week

## 2021-08-08 NOTE — Assessment & Plan Note (Signed)
-  pt would like to consider panniculectomy -referral to plastics

## 2021-08-08 NOTE — Assessment & Plan Note (Signed)
Lab Results  Component Value Date   HGBA1C 8.3 (H) 08/01/2021   -Rx. trulicity -she will meet with pharmacy this AM to discuss affordability -on ACEi and statin

## 2021-08-08 NOTE — Patient Instructions (Addendum)
For leg swelling, take torsemide three times per day for 1 week. Then go back down to 2 times per day.  Please see the pharmacist this AM after checking out.  Please have labs drawn 2-3 days prior to your appointment so we can discuss the results during your office visit.

## 2021-08-08 NOTE — Patient Instructions (Signed)
Erin Sexton,  It was great to talk to you today!  Please call me with any questions or concerns.   Visit Information   PATIENT GOALS:   Goals Addressed             This Visit's Progress    Medication Management       Patient Goals/Self-Care Activities Over the next 90  days, patient will:  Take medications as prescribed Check blood sugar once a day at the following times: fasting (at least 8 hours since last food consumption) and whenever patient experiences symptoms of hypo/hyperglycemia, document, and provide at future appointments Weigh daily, and contact provider if weight gain of more than 3 lbs in 1 day or more than 5 lbs in 1 week Target a minimum of 150 minutes of moderate intensity exercise weekly        Consent to CCM Services: Erin Sexton was given information about Chronic Care Management services including:  CCM service includes personalized support from designated clinical staff supervised by her physician, including individualized plan of care and coordination with other care providers 24/7 contact phone numbers for assistance for urgent and routine care needs. Service will only be billed when office clinical staff spend 20 minutes or more in a month to coordinate care. Only one practitioner may furnish and bill the service in a calendar month. The patient may stop CCM services at any time (effective at the end of the month) by phone call to the office staff. The patient will be responsible for cost sharing (co-pay) of up to 20% of the service fee (after annual deductible is met).  Patient agreed to services and verbal consent obtained.   Print copy of patient instructions, educational materials, and care plan provided in person.  Face to Face appointment with care management team member scheduled for:   Erin Sexton, PharmD Clinical Pharmacist [facility] [phone]   CLINICAL CARE PLAN: Patient Care Plan: Medication  Management     Problem Identified: HLD/TIA, CHF, HTN, T2DM, Asthma   Priority: High  Onset Date: 08/08/2021     Long-Range Goal: Disease Progression Prevention   Start Date: 08/08/2021  Expected End Date: 11/06/2021  This Visit's Progress: On track  Priority: High  Note:   Current Barriers:  Unable to achieve control of diabetes Suboptimal therapeutic regimen for heart failure , weight management, and chronic kidney disease   Pharmacist Clinical Goal(s):  Over the next 90  days, patient will Achieve control of diabetes as evidenced by improved A1c Adhere to plan to optimize therapeutic regimen for heart failure , weight management, and chronic kidney disease as evidenced by report of adherence to recommended medication management changes through collaboration with PharmD and provider.   Interventions: 1:1 collaboration with Erin Larsson, NP regarding development and update of comprehensive plan of care as evidenced by provider attestation and co-signature Inter-disciplinary care team collaboration (see longitudinal plan of care) Comprehensive medication review performed; medication list updated in electronic medical record  Type 2 Diabetes: Current medications: metformin 1,000 mg by mouth  twice daily with meals and empagliflozin (Jardiance) 10 mg by mouth once daily Intolerances: none Taking medications as directed: yes Side effects thought to be attributed to current medication regimen: no Denies hypoglycemic/hyperglycemic symptoms Hypoglycemia prevention: not indicated at this time Current meal patterns: breakfast: does intermittent fasting and eats mostly baked/grilled chicken and vegetables; drinks: water Current exercise:  walks every day at least 1 mile On a statin: $RemoveB'[x]'ujbcaAyr$  Yes  $Re'[]'NrU$  No  Last microalbumin: 12 (06/27/20); on an ACEi/ARB: [x]  Yes  []  No    Last eye exam: goes every December per patient  Last foot exam: completed within last year by Erin Sexton Pneumonia  vaccine: up to date Current glucose readings: fasting blood glucose: within goal range of 80-130 mg/dL per ADA guidelines Uncontrolled; Most recent A1c above goal of <7% per ADA guidelines Introduction to diabetes basic facts Monitoring: target blood sugar range, when to test Continue metformin 1,000 mg by mouth  twice daily with meals and empagliflozin (Jardiance) 10 mg by mouth once daily Per discussion with PCP, will add dulaglutide (Trulicity) 4.92 mg subcutaneously weekly. Patient identified as a good candidate for a GLP-1 receptor agonist given reduction in cardiovascular disease, slowed chronic kidney disease progression, low risk of hypoglycemia, increased satiety , and weight loss. Patient denies a personal or family history of medullary thyroid carcinoma (MTC) or Multiple Endocrine Neoplasia syndrome type 2 (MEN 2). Patient also denies any history of pancreatitis or biliary disease. Encouraged regular aerobic exercise with a goal of 30 minutes five times per week (150 minutes per week) Patient was instructed on appropriate self administration technique for injection of dulaglutide (Trulicity)   Hyperlipidemia/History of TIA (2017): Current medications: atorvastatin 80 mg by mouth once daily Intolerances: none Taking medications as directed: yes Side effects thought to be attributed to current medication regimen: no Controlled; LDL at goal of <70 due to very high risk given established clinical ASCVD (TIA in 2017) per 2020 AACE/ACE guidelines and TG above goal of <150 per 2020 AACE/ACE guidelines Continue atorvastatin 80 mg by mouth once daily Consider addition of prescription or over the counter fish oil to lower triglycerides  Recommend regular aerobic exercise Discussed need for and importance of continued work on weight loss Discussed need for medication compliance Patient plans to discuss whether or not she needs low dose aspirin for secondary  prevention  Overweight/Obesity Unable to achieve goal weight loss through lifestyle modification alone Current treatment:  none   Medications/Strategies previously tried:  none ; Fasting/Modified Fasting Baseline weight: 361 lbs; most recent weight: 313 lbs Extensive dietary counseling including education on focus on lean proteins, fruits and vegetables, whole grains and increased fiber consumption, adequate hydration Add GLP-1 agonist as above Recommend diet modification to induce energy deficit of 500 kcal/day or greater Encouraged regular aerobic exercise with a goal of 30 minutes five times per week (150 minutes per week)  Heart failure with preserved ejection fraction (LVEF >50% with evidence of spontaneous or provokable increased LV filling pressures) & Hypertension: Suboptimally managed Current treatment: lisinopril 2.5 mg by mouth once daily, metoprolol tartrate 50 mg by mouth twice daily, empagliflozin (Jardiance) 10 mg by mouth once daily, and torsemide 20 mg twice daily Stage C (Symptomatic heart failure)/NYHA Class II (Slight limitation of physical activity. Comfortable at rest. Ordinary physical activity results in fatigue, palpitation, or dyspnea) Most recent echocardiogram was on in 2017? Current home blood pressure: does not have a machine to check Current home weights: cehcks daily (309 lbs on 08/08/21) Lower extremity edema: [x]  Yes (minimal)  []   No  []   Undetermined Jugular venous distention: []  Yes  []   No  [x]   Undetermined due to body habitus Ascites: []  Yes  [x]   No  []   Undetermined Denies dyspnea at rest or on exertion and reduced exercise capacity . Reports orthopnea (sleeps in recliner) Continue lisinopril 2.5 mg by mouth once daily, metoprolol tartrate 50 mg by mouth twice daily, empagliflozin (Jardiance)  10 mg by mouth once daily, and torsemide 20 mg twice daily Consider adding spironolactone and discontinuing potassium supplementation Encourage dietary sodium  restriction (<3 g/day) Educated on the importance of weighing daily. Patient aware to contact cardiology/primary care team if weight gain >3 lbs in 1 day or >5 lbs in 1 week  Asthma: Controlled Current treatment: albuterol metered dose (ProAir, Ventolin, Proventil) 1 puff by mouth as needed for shortness of breath and budesonide/formoterol (Symbicort HFA) 160-4.5 mcg 2 puffs by mouth once daily Continue albuterol metered dose (ProAir, Ventolin, Proventil) 1 puff by mouth as needed for shortness of breath and budesonide/formoterol (Symbicort HFA) 160-4.5 mcg 2 puffs by mouth once daily Recommend allergen avoidance (environmental control) Encouraged regular physical activity for general health benefits Reminded patient to rinse mouth with water and spit after using ICS containing inhaler to prevent thrush  Chronic Kidney Disease Stage 3a - GFR 45-59 (Mild to Moderately Reduced Function): Current medications: lisinopril 2.5 mg by mouth once daily, empagliflozin (Jardiance) 10 mg by mouth once daily, metformin 1,000 mg by mouth  twice daily, and atorvastatin 80 mg by mouth once daily Intolerances: none Taking medications as directed: yes Side effects thought to be attributed to current medication regimen: no Denies dizziness, lightheadedness, blurred vision, and headache Most recent GFR: 54 mL/min Most recent microalbumin: 12 (06/27/20) Continue lisinopril 2.5 mg by mouth once daily, empagliflozin (Jardiance) 10 mg by mouth once daily, metformin 1,000 mg by mouth  twice daily with meals, and atorvastatin 80 mg by mouth once daily Add GLP-1 agonist as above Consider addition of spironolactone Encouraged healthy diet including more fruits and vegetables Discussed need for and importance of continued work on weight loss Discussed need for improved control of blood sugar  Patient Goals/Self-Care Activities Over the next 90  days, patient will:  Take medications as prescribed Check blood sugar  once a day at the following times: fasting (at least 8 hours since last food consumption) and whenever patient experiences symptoms of hypo/hyperglycemia, document, and provide at future appointments Weigh daily, and contact provider if weight gain of more than 3 lbs in 1 day or more than 5 lbs in 1 week Target a minimum of 150 minutes of moderate intensity exercise weekly  Follow Up Plan: Face to Face appointment with care management team member scheduled for: 09/05/21

## 2021-08-08 NOTE — Assessment & Plan Note (Signed)
-  STOP NP thyroid; was super-therapeutic -Rx. Levothyroxine when called with lab results

## 2021-08-08 NOTE — Progress Notes (Signed)
Acute Office Visit  Subjective:    Patient ID: Erin Sexton, female    DOB: 18-May-1971, 50 y.o.   MRN: [id number]  Chief Complaint  Patient presents with   Follow-up    Labs. Complains of lower leg swelling x3-4 days.     HPI Patient is in today for lab follow-up for hypothyroidism and diabetes.  She has had sinus pressure for the last "few days", and she states it started over the weekend.  Past Medical History:  Diagnosis Date   Abnormal CT scan    a. 02/2014: evaluated for dyspnea and initially placed on Xarelto for PE because radiologist could not exclude small peripheral pulmonary emboli because of limited contrast. However, after admission for ++vaginal bleeding several days later, it was felt she did NOT have PE - underwent further testing with neg VQ 03/06/14, neg LE duplex.   Anemia    Anxiety    Asthma    Blood transfusion without reported diagnosis    Bone spur of right foot    CHF (congestive heart failure) (HCC)    COPD (chronic obstructive pulmonary disease) ([place])    Depression    Diabetes mellitus without complication ([place])    a. Dx 02/2014.   History of echocardiogram 2015   normal EF, Grade II diastolic dysfunction   HOCM (hypertrophic obstructive cardiomyopathy) ([place])    a. Dx [address]9450.   Hypertension    Morbid obesity ([place])    NSVT (nonsustained ventricular tachycardia) ([place])    a. Noted on tele during 02/2014 adm for symptomatic anemia.   SVT (supraventricular tachycardia) ([place])    a. Noted on tele during 02/2014 adm for symptomatic anemia.   Vaginal bleeding     Past Surgical History:  Procedure Laterality Date   COLONOSCOPY N/A 09/30/2019   Procedure: COLONOSCOPY;  Surgeon: Rogene Houston, MD;  Location: [facility];  Service: Endoscopy;  Laterality: N/A;  [place] N/A 08/07/2016   Procedure: DILATATION & CURETTAGE/HYSTEROSCOPY WITH ATTPEMPTED NOVASURE ABLATION AND INSERTION OF IUD;   Surgeon: Florian Buff, MD;  Location: [facility] ORS;  Service: Gynecology;  Laterality: N/A;    Family History  Problem Relation Age of Onset   Diabetes Mother    Arthritis Mother    Other Mother        WPW   Mental illness Mother        Depression   Heart disease Father        History of coronary bypass grafting at age 27   Mental illness Sister    Bipolar disorder Sister    Heart disease Sister        possibly WPW   Uterine cancer Maternal Grandmother    Mental illness Maternal Grandmother    Heart disease Maternal Grandfather    CAD Neg Hx        No family history of EARLY CAD    Social History   Socioeconomic History   Marital status: Divorced    Spouse name: Not on file   Number of children: 0   Years of education: Not on file   Highest education level: Not on file  Occupational History   Occupation: Disabled  Tobacco Use   Smoking status: Never   Smokeless tobacco: Never  Vaping Use   Vaping Use: Never used  Substance and Sexual Activity   Alcohol use: No   Drug use: No   Sexual activity: Not Currently  Birth control/protection: I.U.D., Post-menopausal    Comment: IUD in place to control vaginal bleeding  Other Topics Concern   Not on file  Social History Narrative   Divorced for 3 years,married for 5 years.On disability since 2009 secondary to mental illness.   Social Determinants of Health   Financial Resource Strain: Medium Risk   Difficulty of Paying Living Expenses: Somewhat hard  Food Insecurity: No Food Insecurity   Worried About Charity fundraiser in the Last Year: Never true   Ran Out of Food in the Last Year: Never true  Transportation Needs: No Transportation Needs   Lack of Transportation (Medical): No   Lack of Transportation (Non-Medical): No  Physical Activity: Sufficiently Active   Days of Exercise per Week: 4 days   Minutes of Exercise per Session: 40 min  Stress: No Stress Concern Present   Feeling of Stress : Only a little  Social  Connections: Socially Isolated   Frequency of Communication with Friends and Family: Once a week   Frequency of Social Gatherings with Friends and Family: Never   Attends Religious Services: Never   Printmaker: No   Attends Music therapist: Never   Marital Status: Divorced  Human resources officer Violence: Not At Risk   Fear of Current or Ex-Partner: No   Emotionally Abused: No   Physically Abused: No   Sexually Abused: No    Outpatient Medications Prior to Visit  Medication Sig Dispense Refill   ACCU-CHEK GUIDE test strip USE 1 STRIP TO CHECK GLUCOSE ONCE DAILY 100 each 0   Accu-Chek Softclix Lancets lancets USE 1 LANCET TO CHECK GLUCOSE ONCE DAILY 100 each 0   albuterol (PROVENTIL) (5 MG/ML) 0.5% nebulizer solution Take 0.5 mLs (2.5 mg total) by nebulization every 6 (six) hours as needed for wheezing or shortness of breath. 20 mL 0   albuterol (VENTOLIN HFA) 108 (90 Base) MCG/ACT inhaler Inhale 2 puffs into the lungs as needed (wheezing).      atorvastatin (LIPITOR) 80 MG tablet Take 1 tablet (80 mg total) by mouth daily. 90 tablet 0   blood glucose meter kit and supplies KIT Dispense based on patient and insurance preference. Use once a day. Diagnosis code:E11.9 1 each 0   buPROPion (WELLBUTRIN XL) 300 MG 24 hr tablet Take 1 tablet (300 mg total) by mouth daily. 90 tablet 0   cetirizine (ZYRTEC ALLERGY) 10 MG tablet Take 1 tablet (10 mg total) by mouth daily. (Patient taking differently: Take 10 mg by mouth.) 30 tablet 0   Cholecalciferol (VITAMIN D3 PO) Take 10,000 Units by mouth daily.     clindamycin (CLEOCIN T) 1 % lotion APPLY TO AFFECTED AREAS OF BOILS TWICE DAILY AS NEEDED     clotrimazole-betamethasone (LOTRISONE) cream APPLY CREAM TOPICALLY TWICE DAILY 15 g 0   estradiol (ESTRACE) 2 MG tablet Take 1 tablet (2 mg total) by mouth at bedtime. 90 tablet 3   JARDIANCE 10 MG TABS tablet Take 1 tablet (10 mg total) by mouth daily. 90 tablet 0    Lancet Device MISC Use to check your blood sugar daily 100 each 5   levonorgestrel (MIRENA) 20 MCG/24HR IUD 1 each by Intrauterine route once.     levothyroxine (SYNTHROID) 150 MCG tablet Take 1 tablet (150 mcg total) by mouth daily. 90 tablet 3   lisinopril (ZESTRIL) 2.5 MG tablet Take 1 tablet (2.5 mg total) by mouth daily. 90 tablet 0   megestrol (MEGACE) 40 MG tablet  Take 1 tablet by mouth once daily 30 tablet 11   metFORMIN (GLUCOPHAGE) 500 MG tablet Take 2 tablets (1,000 mg total) by mouth 2 (two) times daily. 360 tablet 0   metoprolol tartrate (LOPRESSOR) 50 MG tablet Take 1 tablet (50 mg total) by mouth 2 (two) times daily. 180 tablet 0   potassium chloride SA (KLOR-CON) 20 MEQ tablet Take 1 tablet (20 mEq total) by mouth 2 (two) times daily. 60 tablet 2   SYMBICORT 160-4.5 MCG/ACT inhaler Inhale 2 puffs into the lungs as needed.     torsemide (DEMADEX) 20 MG tablet Take 1 tablet (20 mg total) by mouth 2 (two) times daily. 180 tablet 0   No facility-administered medications prior to visit.    No Known Allergies  Review of Systems  Constitutional: Negative.   HENT:  Positive for congestion, sinus pressure and sinus pain. Negative for sore throat.   Respiratory: Negative.    Cardiovascular:  Positive for leg swelling.  Musculoskeletal: Negative.       Objective:    Physical Exam Constitutional:      Appearance: Normal appearance. She is obese.  HENT:     Right Ear: Tympanic membrane, ear canal and external ear normal.     Left Ear: Tympanic membrane, ear canal and external ear normal.     Ears:     Comments: Serous fluid behind right TM    Nose: Nose normal.     Mouth/Throat:     Mouth: Mucous membranes are moist.     Pharynx: Oropharynx is clear.  Cardiovascular:     Rate and Rhythm: Normal rate and regular rhythm.     Pulses: Normal pulses.     Heart sounds: Normal heart sounds.     Comments: -non-pitting edema to BLE Pulmonary:     Effort: Pulmonary effort is  normal.     Breath sounds: Normal breath sounds.  Musculoskeletal:     Cervical back: Normal range of motion and neck supple.  Neurological:     Mental Status: She is alert.    BP 127/70 (BP Location: Right Arm, Patient Position: Sitting, Cuff Size: Large)   Pulse 70   Temp 98.3 F (36.8 C) (Oral)   Ht $R'5\' 9"'OL$  (1.753 m)   Wt (!) 313 lb (142 kg)   SpO2 96%   BMI 46.22 kg/m  Wt Readings from Last 3 Encounters:  08/08/21 (!) 313 lb (142 kg)  08/01/21 (!) 310 lb 1.9 oz (140.7 kg)  05/09/21 (!) 311 lb (141.1 kg)    Health Maintenance Due  Topic Date Due   Zoster Vaccines- Shingrix (1 of 2) Never done    There are no preventive care reminders to display for this patient.   Lab Results  Component Value Date   TSH 0.981 08/01/2021   Lab Results  Component Value Date   WBC 11.6 (H) 08/01/2021   HGB 16.5 (H) 08/01/2021   HCT 46.9 (H) 08/01/2021   MCV 89 08/01/2021   PLT 277 08/01/2021   Lab Results  Component Value Date   NA 141 08/01/2021   K 3.7 08/01/2021   CO2 21 08/01/2021   GLUCOSE 189 (H) 08/01/2021   BUN 18 08/01/2021   CREATININE 1.21 (H) 08/01/2021   BILITOT 0.6 08/01/2021   ALKPHOS 124 (H) 08/01/2021   AST 18 08/01/2021   ALT 19 08/01/2021   PROT 6.5 08/01/2021   ALBUMIN 4.3 08/01/2021   CALCIUM 8.9 08/01/2021   ANIONGAP 12 01/31/2019   EGFR  54 (L) 08/01/2021   Lab Results  Component Value Date   CHOL 101 08/01/2021   Lab Results  Component Value Date   HDL 33 (L) 08/01/2021   Lab Results  Component Value Date   LDLCALC 32 08/01/2021   Lab Results  Component Value Date   TRIG 233 (H) 08/01/2021   Lab Results  Component Value Date   CHOLHDL 2.7 06/27/2020   Lab Results  Component Value Date   HGBA1C 8.3 (H) 08/01/2021       Assessment & Plan:   Problem List Items Addressed This Visit       Endocrine   Diabetes mellitus type 2, noninsulin dependent ([place]) - Primary    Lab Results  Component Value Date   HGBA1C 8.3 (H)  08/01/2021  -Rx. trulicity -she will meet with pharmacy this AM to discuss affordability -on ACEi and statin      Relevant Medications   Dulaglutide (TRULICITY) 1.63 AG/5.3MI SOPN   Dulaglutide (TRULICITY) 1.5 WO/0.3OZ SOPN (Start on 09/05/2021)   Hypothyroidism    -STOP NP thyroid; was super-therapeutic -Rx. Levothyroxine when called with lab results      Relevant Orders   TSH+T4F+T3Free     Other   Morbid obesity ([place])    -pt would like to consider panniculectomy -referral to plastics      Relevant Medications   Dulaglutide (TRULICITY) 2.24 MG/5.0IB SOPN   Dulaglutide (TRULICITY) 1.5 BC/4.8GQ SOPN (Start on 09/05/2021)   Other Relevant Orders   Ambulatory referral to Plastic Surgery   Leg swelling    -INCREASE torsemide to 3 tabs daily for 1 week      Nasal congestion    -Rx. norel -if symptoms persist through the weekend, will consider abx       Relevant Medications   Chlorphen-PE-Acetaminophen (NOREL AD) 4-10-325 MG TABS     Meds ordered this encounter  Medications   Dulaglutide (TRULICITY) 9.16 XI/5.0TU SOPN    Sig: Inject 0.75 mg into the skin once a week.    Dispense:  2 mL    Refill:  0   Dulaglutide (TRULICITY) 1.5 UE/2.8MK SOPN    Sig: Inject 1.5 mg into the skin once a week.    Dispense:  6 mL    Refill:  1   Chlorphen-PE-Acetaminophen (NOREL AD) 4-10-325 MG TABS    Sig: Take 1 tablet by mouth every 4 (four) hours as needed (nasal congestion, cold symptoms).    Dispense:  20 tablet    Refill:  [place], NP

## 2021-08-09 ENCOUNTER — Ambulatory Visit (HOSPITAL_COMMUNITY)
Admission: RE | Admit: 2021-08-09 | Discharge: 2021-08-09 | Disposition: A | Discharge status: Home / Self Care | Payer: Medicare Other | Source: Ambulatory Visit | Attending: Internal Medicine | Admitting: Internal Medicine

## 2021-08-09 ENCOUNTER — Other Ambulatory Visit (HOSPITAL_COMMUNITY): Payer: Self-pay | Admitting: Nurse Practitioner

## 2021-08-09 DIAGNOSIS — Z1231 Encounter for screening mammogram for malignant neoplasm of breast: Secondary | ICD-10-CM

## 2021-08-10 ENCOUNTER — Other Ambulatory Visit: Payer: Self-pay

## 2021-08-10 DIAGNOSIS — J45909 Unspecified asthma, uncomplicated: Secondary | ICD-10-CM

## 2021-08-10 MED ORDER — SYMBICORT 160-4.5 MCG/ACT IN AERO
2.0000 | INHALATION_SPRAY | RESPIRATORY_TRACT | 2 refills | Status: DC | PRN
Start: 2021-08-10 — End: 2024-09-06

## 2021-08-14 ENCOUNTER — Other Ambulatory Visit: Payer: Self-pay

## 2021-08-14 ENCOUNTER — Ambulatory Visit (INDEPENDENT_AMBULATORY_CARE_PROVIDER_SITE_OTHER): Payer: Medicare Other | Admitting: Internal Medicine

## 2021-08-14 DIAGNOSIS — I5032 Chronic diastolic (congestive) heart failure: Secondary | ICD-10-CM

## 2021-08-14 DIAGNOSIS — E559 Vitamin D deficiency, unspecified: Secondary | ICD-10-CM

## 2021-08-14 DIAGNOSIS — E119 Type 2 diabetes mellitus without complications: Secondary | ICD-10-CM

## 2021-08-14 DIAGNOSIS — E785 Hyperlipidemia, unspecified: Secondary | ICD-10-CM

## 2021-08-14 DIAGNOSIS — J4540 Moderate persistent asthma, uncomplicated: Secondary | ICD-10-CM

## 2021-08-14 DIAGNOSIS — I10 Essential (primary) hypertension: Secondary | ICD-10-CM

## 2021-08-14 MED ORDER — CLOTRIMAZOLE-BETAMETHASONE 1-0.05 % EX CREA: TOPICAL_CREAM | CUTANEOUS | 0 refills | Status: DC

## 2021-08-14 NOTE — Progress Notes (Signed)
Mammogram is negative. Repeat testing in 1 year.

## 2021-08-26 ENCOUNTER — Other Ambulatory Visit: Payer: Self-pay | Admitting: Nurse Practitioner

## 2021-08-26 DIAGNOSIS — E559 Vitamin D deficiency, unspecified: Secondary | ICD-10-CM

## 2021-08-26 DIAGNOSIS — I5032 Chronic diastolic (congestive) heart failure: Secondary | ICD-10-CM

## 2021-08-26 DIAGNOSIS — E785 Hyperlipidemia, unspecified: Secondary | ICD-10-CM

## 2021-08-26 DIAGNOSIS — I10 Essential (primary) hypertension: Secondary | ICD-10-CM

## 2021-08-26 DIAGNOSIS — E119 Type 2 diabetes mellitus without complications: Secondary | ICD-10-CM

## 2021-08-26 DIAGNOSIS — J4540 Moderate persistent asthma, uncomplicated: Secondary | ICD-10-CM

## 2021-08-27 ENCOUNTER — Other Ambulatory Visit: Payer: Self-pay

## 2021-08-27 ENCOUNTER — Ambulatory Visit (INDEPENDENT_AMBULATORY_CARE_PROVIDER_SITE_OTHER): Payer: Medicare Other

## 2021-08-27 VITALS — BP 127/78 | HR 75 | Temp 98.4°F | Ht 69.0 in | Wt 304.0 lb

## 2021-08-27 DIAGNOSIS — Z0000 Encounter for general adult medical examination without abnormal findings: Secondary | ICD-10-CM

## 2021-08-27 NOTE — Patient Instructions (Signed)
Erin Sexton , Thank you for taking time to come for your Medicare Wellness Visit. I appreciate your ongoing commitment to your health goals. Please review the following plan we discussed and let me know if I can assist you in the future.   Screening recommendations/referrals: Colonoscopy: Done 09/30/2019 Repeat in 10 years  Mammogram: Done 08/09/21 Repeat annually  Bone Density: Due at age 50  Recommended yearly ophthalmology/optometry visit for glaucoma screening and checkup Recommended yearly dental visit for hygiene and checkup  Vaccinations: Influenza vaccine: 08/27/21, Repeat annually  Pneumococcal vaccine: Done 08/10/15 09/27/20 Tdap vaccine: 12/09/2014 Repeat in 10 years  Shingles vaccine: Shingrix discussed. Please contact your pharmacy for coverage information.    Covid-19: Declined  Advanced directives: Please bring a copy of your health care power of attorney and living will to the office to be added to your chart at your convenience.   Conditions/risks identified: Aim for 30 minutes of exercise or walking each day, drink 6-8 glasses of water and eat lots of fruits and vegetables.   Next appointment: Follow up in one year for your annual wellness visit. 09/10/21 @ 10:20 am.  Preventive Care 40-64 Years, Female Preventive care refers to lifestyle choices and visits with your health care provider that can promote health and wellness. What does preventive care include? A yearly physical exam. This is also called an annual well check. Dental exams once or twice a year. Routine eye exams. Ask your health care provider how often you should have your eyes checked. Personal lifestyle choices, including: Daily care of your teeth and gums. Regular physical activity. Eating a healthy diet. Avoiding tobacco and drug use. Limiting alcohol use. Practicing safe sex. Taking low-dose aspirin daily starting at age 24. Taking vitamin and mineral supplements as recommended by your  health care provider. What happens during an annual well check? The services and screenings done by your health care provider during your annual well check will depend on your age, overall health, lifestyle risk factors, and family history of disease. Counseling  Your health care provider may ask you questions about your: Alcohol use. Tobacco use. Drug use. Emotional well-being. Home and relationship well-being. Sexual activity. Eating habits. Work and work Statistician. Method of birth control. Menstrual cycle. Pregnancy history. Screening  You may have the following tests or measurements: Height, weight, and BMI. Blood pressure. Lipid and cholesterol levels. These may be checked every 5 years, or more frequently if you are over 29 years old. Skin check. Lung cancer screening. You may have this screening every year starting at age 35 if you have a 30-pack-year history of smoking and currently smoke or have quit within the past 15 years. Fecal occult blood test (FOBT) of the stool. You may have this test every year starting at age 29. Flexible sigmoidoscopy or colonoscopy. You may have a sigmoidoscopy every 5 years or a colonoscopy every 10 years starting at age 47. Hepatitis C blood test. Hepatitis B blood test. Sexually transmitted disease (STD) testing. Diabetes screening. This is done by checking your blood sugar (glucose) after you have not eaten for a while (fasting). You may have this done every 1-3 years. Mammogram. This may be done every 1-2 years. Talk to your health care provider about when you should start having regular mammograms. This may depend on whether you have a family history of breast cancer. BRCA-related cancer screening. This may be done if you have a family history of breast, ovarian, tubal, or peritoneal cancers. Pelvic exam and Pap  test. This may be done every 3 years starting at age 35. Starting at age 38, this may be done every 5 years if you have a Pap test  in combination with an HPV test. Bone density scan. This is done to screen for osteoporosis. You may have this scan if you are at high risk for osteoporosis. Discuss your test results, treatment options, and if necessary, the need for more tests with your health care provider. Vaccines  Your health care provider may recommend certain vaccines, such as: Influenza vaccine. This is recommended every year. Tetanus, diphtheria, and acellular pertussis (Tdap, Td) vaccine. You may need a Td booster every 10 years. Zoster vaccine. You may need this after age 14. Pneumococcal 13-valent conjugate (PCV13) vaccine. You may need this if you have certain conditions and were not previously vaccinated. Pneumococcal polysaccharide (PPSV23) vaccine. You may need one or two doses if you smoke cigarettes or if you have certain conditions. Talk to your health care provider about which screenings and vaccines you need and how often you need them. This information is not intended to replace advice given to you by your health care provider. Make sure you discuss any questions you have with your health care provider. Document Released: 12/22/2015 Document Revised: 08/14/2016 Document Reviewed: 09/26/2015 Elsevier Interactive Patient Education  2017 [employer] Prevention in the Home Falls can cause injuries. They can happen to people of all ages. There are many things you can do to make your home safe and to help prevent falls. What can I do on the outside of my home? Regularly fix the edges of walkways and driveways and fix any cracks. Remove anything that might make you trip as you walk through a door, such as a raised step or threshold. Trim any bushes or trees on the path to your home. Use bright outdoor lighting. Clear any walking paths of anything that might make someone trip, such as rocks or tools. Regularly check to see if handrails are loose or broken. Make sure that both sides of any steps have  handrails. Any raised decks and porches should have guardrails on the edges. Have any leaves, snow, or ice cleared regularly. Use sand or salt on walking paths during winter. Clean up any spills in your garage right away. This includes oil or grease spills. What can I do in the bathroom? Use night lights. Install grab bars by the toilet and in the tub and shower. Do not use towel bars as grab bars. Use non-skid mats or decals in the tub or shower. If you need to sit down in the shower, use a plastic, non-slip stool. Keep the floor dry. Clean up any water that spills on the floor as soon as it happens. Remove soap buildup in the tub or shower regularly. Attach bath mats securely with double-sided non-slip rug tape. Do not have throw rugs and other things on the floor that can make you trip. What can I do in the bedroom? Use night lights. Make sure that you have a light by your bed that is easy to reach. Do not use any sheets or blankets that are too big for your bed. They should not hang down onto the floor. Have a firm chair that has side arms. You can use this for support while you get dressed. Do not have throw rugs and other things on the floor that can make you trip. What can I do in the kitchen? Clean up any spills  right away. Avoid walking on wet floors. Keep items that you use a lot in easy-to-reach places. If you need to reach something above you, use a strong step stool that has a grab bar. Keep electrical cords out of the way. Do not use floor polish or wax that makes floors slippery. If you must use wax, use non-skid floor wax. Do not have throw rugs and other things on the floor that can make you trip. What can I do with my stairs? Do not leave any items on the stairs. Make sure that there are handrails on both sides of the stairs and use them. Fix handrails that are broken or loose. Make sure that handrails are as long as the stairways. Check any carpeting to make sure  that it is firmly attached to the stairs. Fix any carpet that is loose or worn. Avoid having throw rugs at the top or bottom of the stairs. If you do have throw rugs, attach them to the floor with carpet tape. Make sure that you have a light switch at the top of the stairs and the bottom of the stairs. If you do not have them, ask someone to add them for you. What else can I do to help prevent falls? Wear shoes that: Do not have high heels. Have rubber bottoms. Are comfortable and fit you well. Are closed at the toe. Do not wear sandals. If you use a stepladder: Make sure that it is fully opened. Do not climb a closed stepladder. Make sure that both sides of the stepladder are locked into place. Ask someone to hold it for you, if possible. Clearly mark and make sure that you can see: Any grab bars or handrails. First and last steps. Where the edge of each step is. Use tools that help you move around (mobility aids) if they are needed. These include: Canes. Walkers. Scooters. Crutches. Turn on the lights when you go into a dark area. Replace any light bulbs as soon as they burn out. Set up your furniture so you have a clear path. Avoid moving your furniture around. If any of your floors are uneven, fix them. If there are any pets around you, be aware of where they are. Review your medicines with your doctor. Some medicines can make you feel dizzy. This can increase your chance of falling. Ask your doctor what other things that you can do to help prevent falls. This information is not intended to replace advice given to you by your health care provider. Make sure you discuss any questions you have with your health care provider. Document Released: 09/21/2009 Document Revised: 05/02/2016 Document Reviewed: 12/30/2014 Elsevier Interactive Patient Education  2017 [employer].

## 2021-08-27 NOTE — Progress Notes (Addendum)
[name] is a 50 y.o. female who presents for Medicare Annual (Subsequent) preventive examination.  Review of Systems     Cardiac Risk Factors include: diabetes mellitus;hypertension;sedentary lifestyle;obesity (BMI >30kg/m2)     Objective:    Today's Vitals   08/27/21 1021 08/27/21 1023  BP: 127/78   Pulse: 75   Temp: 98.4 F (36.9 C)   SpO2: 96%   Weight: (!) 304 lb (137.9 kg)   Height: 5\' 9"  (1.753 m)   PainSc:  0-No pain   Body mass index is 44.89 kg/m.  Advanced Directives 08/27/2021 05/28/2020 03/18/2020 12/29/2019 09/30/2019 01/31/2019 02/22/2017  Does Patient Have a Medical Advance Directive? Yes Yes No Yes No No No  Type of Advance Directive Living will;Healthcare Power of Attorney Living will - Out of facility DNR (pink MOST or yellow form) - - -  Does patient want to make changes to medical advance directive? - - - No - Patient declined - - -  Copy of Healthcare Power of Attorney in Chart? No - copy requested - - - - - -  Would patient like information on creating a medical advance directive? - - No - Patient declined - No - Patient declined - No - Patient declined  Pre-existing out of facility DNR order (yellow form or pink MOST form) - - - - - - -    Current Medications (verified) Outpatient Encounter Medications as of 08/27/2021  Medication Sig   Accu-Chek Softclix Lancets lancets USE 1 LANCET TO CHECK GLUCOSE ONCE DAILY   albuterol (PROVENTIL) (5 MG/ML) 0.5% nebulizer solution Take 0.5 mLs (2.5 mg total) by nebulization every 6 (six) hours as needed for wheezing or shortness of breath.   albuterol (VENTOLIN HFA) 108 (90 Base) MCG/ACT inhaler Inhale 2 puffs into the lungs as needed (wheezing).    atorvastatin (LIPITOR) 80 MG tablet Take 1 tablet (80 mg total) by mouth daily.   blood glucose meter kit and supplies KIT Dispense based on patient and insurance preference. Use once a day. Diagnosis code:E11.9   buPROPion (WELLBUTRIN XL) 300 MG 24  hr tablet Take 1 tablet (300 mg total) by mouth daily.   cetirizine (ZYRTEC ALLERGY) 10 MG tablet Take 1 tablet (10 mg total) by mouth daily. (Patient taking differently: Take 10 mg by mouth.)   Cholecalciferol (VITAMIN D3 PO) Take 10,000 Units by mouth daily.   clindamycin (CLEOCIN T) 1 % lotion    clotrimazole-betamethasone (LOTRISONE) cream APPLY  CREAM TOPICALLY TWICE DAILY   Dulaglutide (TRULICITY) 0.75 MG/0.5ML SOPN Inject 0.75 mg into the skin once a week.   estradiol (ESTRACE) 2 MG tablet Take 1 tablet (2 mg total) by mouth at bedtime.   glucose blood (ACCU-CHEK GUIDE) test strip USE 1 STRIP TO CHECK GLUCOSE ONCE DAILY   JARDIANCE 10 MG TABS tablet Take 1 tablet (10 mg total) by mouth daily.   Lancet Device MISC Use to check your blood sugar daily   levonorgestrel (MIRENA) 20 MCG/24HR IUD 1 each by Intrauterine route once.   levothyroxine (SYNTHROID) 150 MCG tablet Take 1 tablet (150 mcg total) by mouth daily.   lisinopril (ZESTRIL) 2.5 MG tablet Take 1 tablet (2.5 mg total) by mouth daily.   Magnesium 200 MG TABS Take 1 tablet by mouth daily.   megestrol (MEGACE) 40 MG tablet Take 1 tablet by mouth once daily   metFORMIN (GLUCOPHAGE) 500 MG tablet Take 2 tablets (1,000 mg total) by mouth 2 (two) times daily.   metoprolol  tartrate (LOPRESSOR) 50 MG tablet Take 1 tablet (50 mg total) by mouth 2 (two) times daily.   potassium chloride SA (KLOR-CON) 20 MEQ tablet Take 1 tablet (20 mEq total) by mouth 2 (two) times daily.   SYMBICORT 160-4.5 MCG/ACT inhaler Inhale 2 puffs into the lungs as needed.   torsemide (DEMADEX) 20 MG tablet Take 1 tablet (20 mg total) by mouth 2 (two) times daily.   Chlorphen-PE-Acetaminophen (NOREL AD) 4-10-325 MG TABS Take 1 tablet by mouth every 4 (four) hours as needed (nasal congestion, cold symptoms). (Patient not taking: No sig reported)   [START ON 09/05/2021] Dulaglutide (TRULICITY) 1.5 KT/6.2BW SOPN Inject 1.5 mg into the skin once a week. (Patient not  taking: No sig reported)   No facility-administered encounter medications on file as of 08/27/2021.    Allergies (verified) Patient has no known allergies.   History: Past Medical History:  Diagnosis Date   Abnormal CT scan    a. 02/2014: evaluated for dyspnea and initially placed on Xarelto for PE because radiologist could not exclude small peripheral pulmonary emboli because of limited contrast. However, after admission for ++vaginal bleeding several days later, it was felt she did NOT have PE - underwent further testing with neg VQ 03/06/14, neg LE duplex.   Anemia    Anxiety    Arthritis    Asthma    Blood transfusion without reported diagnosis    Bone spur of right foot    CHF (congestive heart failure) (HCC)    COPD (chronic obstructive pulmonary disease) ([place])    Depression    Diabetes mellitus without complication ([place])    a. Dx 02/2014.   Heart murmur    History of echocardiogram 2015   normal EF, Grade II diastolic dysfunction   HOCM (hypertrophic obstructive cardiomyopathy) ([place])    a. Dx [address].   Hyperlipidemia    Hypertension    Morbid obesity ([place])    NSVT (nonsustained ventricular tachycardia) ([place])    a. Noted on tele during 02/2014 adm for symptomatic anemia.   Stroke [facility])    SVT (supraventricular tachycardia) ([place])    a. Noted on tele during 02/2014 adm for symptomatic anemia.   Thyroid disease    Vaginal bleeding    Past Surgical History:  Procedure Laterality Date   COLONOSCOPY N/A 09/30/2019   Procedure: COLONOSCOPY;  Surgeon: Rogene Houston, MD;  Location: [facility];  Service: Endoscopy;  Laterality: N/A;  [place] N/A 08/07/2016   Procedure: DILATATION & CURETTAGE/HYSTEROSCOPY WITH ATTPEMPTED NOVASURE ABLATION AND INSERTION OF IUD;  Surgeon: Florian Buff, MD;  Location: [facility] ORS;  Service: Gynecology;  Laterality: N/A;   Family History  Problem Relation Age of Onset   Diabetes Mother     Arthritis Mother    Other Mother        WPW   Mental illness Mother        Depression   Asthma Mother    Obesity Mother    Heart disease Father        History of coronary bypass grafting at age 39   Hypertension Father    Mental illness Sister    Bipolar disorder Sister    Heart disease Sister        possibly WPW   Depression Sister    Varicose Veins Sister    Uterine cancer Maternal Grandmother    Mental illness Maternal Grandmother    Arthritis Maternal Grandmother  Hyperlipidemia Maternal Grandmother    Heart disease Maternal Grandfather    CAD Neg Hx        No family history of EARLY CAD   Social History   Socioeconomic History   Marital status: Divorced    Spouse name: Not on file   Number of children: 0   Years of education: Not on file   Highest education level: Not on file  Occupational History   Occupation: Disabled  Tobacco Use   Smoking status: Never   Smokeless tobacco: Never  Vaping Use   Vaping Use: Never used  Substance and Sexual Activity   Alcohol use: No   Drug use: No   Sexual activity: Not Currently    Birth control/protection: I.U.D., Post-menopausal    Comment: IUD in place to control vaginal bleeding  Other Topics Concern   Not on file  Social History Narrative   Divorced for 3 years,married for 5 years.On disability since 2009 secondary to mental illness.   Social Determinants of Health   Financial Resource Strain: Medium Risk   Difficulty of Paying Living Expenses: Somewhat hard  Food Insecurity: No Food Insecurity   Worried About Charity fundraiser in the Last Year: Never true   Ran Out of Food in the Last Year: Never true  Transportation Needs: No Transportation Needs   Lack of Transportation (Medical): No   Lack of Transportation (Non-Medical): No  Physical Activity: Insufficiently Active   Days of Exercise per Week: 7 days   Minutes of Exercise per Session: 20 min  Stress: No Stress Concern Present   Feeling of Stress :  Not at all  Social Connections: Socially Isolated   Frequency of Communication with Friends and Family: More than three times a week   Frequency of Social Gatherings with Friends and Family: More than three times a week   Attends Religious Services: Never   Marine scientist or Organizations: No   Attends Music therapist: Never   Marital Status: Divorced    Tobacco Counseling Counseling given: Not Answered   Clinical Intake:  Pre-visit preparation completed: Yes  Pain : No/denies pain Pain Score: 0-No pain     BMI - recorded: 44.89 Nutritional Status: BMI > 30  Obese Nutritional Risks: None Diabetes: Yes  How often do you need to have someone help you when you read instructions, pamphlets, or other written materials from your doctor or pharmacy?: 1 - Never  Diabetic? Nutrition Risk Assessment:  Has the patient had any N/V/D within the last 2 months?  No  Does the patient have any non-healing wounds?  No  Has the patient had any unintentional weight loss or weight gain?  No   Diabetes:  Is the patient diabetic?  Yes  If diabetic, was a CBG obtained today?  No  Did the patient bring in their glucometer from home?  No  How often do you monitor your CBG's? Daily, 134 this am.   Financial Strains and Diabetes Management:  Are you having any financial strains with the device, your supplies or your medication? No .  Does the patient want to be seen by Chronic Care Management for management of their diabetes?  Yes  Would the patient like to be referred to a Nutritionist or for Diabetic Management?  No   Diabetic Exams:  Diabetic Eye Exam: Completed 11/2020. Overdue for diabetic eye exam. Pt has been advised about the importance in completing this exam. A referral has been placed today.  Message sent to referral coordinator for scheduling purposes. Advised pt to expect a call from office referred to regarding appt.  Diabetic Foot Exam: Completed 04/03/21.  Pt has been advised about the importance in completing this exam.    Interpreter Needed?: No  Information entered by :: Randal Buba, LPN   Activities of Daily Living In your present state of health, do you have any difficulty performing the following activities: 08/27/2021  Hearing? N  Vision? N  Difficulty concentrating or making decisions? Y  Comment Some short term memory issues per pt.  Walking or climbing stairs? N  Dressing or bathing? N  Doing errands, shopping? N  Preparing Food and eating ? N  Using the Toilet? N  In the past six months, have you accidently leaked urine? N  Do you have problems with loss of bowel control? N  Managing your Medications? N  Managing your Finances? N  Housekeeping or managing your Housekeeping? N  Some recent data might be hidden    Patient Care Team: [name], NP as PCP - General (Nurse Practitioner) Herminio Commons, MD (Inactive) as PCP - Cardiology (Cardiology) Beryle Lathe, [facility] (Pharmacist)  Indicate any recent Medical Services you may have received from other than [facility] providers in the past year (date may be approximate).     Assessment:   This is a routine wellness examination for [place].  Hearing/Vision screen Hearing Screening - Comments:: No hearing issues. Vision Screening - Comments:: Glasses. [facility]. Visit scheduled for 11/2021.  Dietary issues and exercise activities discussed: Current Exercise Habits: Home exercise routine, Type of exercise: walking, Time (Minutes): 20, Frequency (Times/Week): 7, Weekly Exercise (Minutes/Week): 140, Intensity: Mild, Exercise limited by: cardiac condition(s);respiratory conditions(s)   Goals Addressed             This Visit's Progress    Exercise 3x per week (30 min per time)       Weight (lb) < 200 lb (90.7 kg)   304 lb (137.9 kg)    On track       Depression Screen PHQ 2/9 Scores 08/27/2021 08/08/2021 08/01/2021 05/08/2021 04/03/2021  01/01/2021 12/29/2019  PHQ - 2 Score 0 $Remov'6 4 2 'qJiMVw$ 0 5 0  PHQ- 9 Score 0 $Remov'12 11 12 'xIzdDX$ - 13 -    Fall Risk Fall Risk  08/27/2021 08/08/2021 08/01/2021 05/08/2021 04/03/2021  Falls in the past year? 0 0 0 0 0  Number falls in past yr: 0 0 0 - 0  Injury with Fall? 0 0 0 - 0  Comment - - - - -  Risk for fall due to : Impaired vision No Fall Risks - - No Fall Risks  Follow up Falls prevention discussed Falls evaluation completed - - Falls evaluation completed    FALL RISK PREVENTION PERTAINING TO THE HOME:  Any stairs in or around the home? No  If so, are there any without handrails? No  Home free of loose throw rugs in walkways, pet beds, electrical cords, etc? Yes  Adequate lighting in your home to reduce risk of falls? Yes   ASSISTIVE DEVICES UTILIZED TO PREVENT FALLS:  Life alert? No  Use of a cane, walker or w/c? No  Grab bars in the bathroom? No  Shower chair or bench in shower? Yes  Elevated toilet seat or a handicapped toilet? No   TIMED UP AND GO:  Was the test performed? No .  Length of time to ambulate 10 feet: 5 sec.   Gait  steady and fast without use of assistive device  Cognitive Function:     6CIT Screen 08/27/2021 12/29/2019  What Year? 0 points 0 points  What month? 0 points 0 points  What time? 0 points 0 points  Count back from 20 0 points 0 points  Months in reverse 0 points 0 points  Repeat phrase 0 points 0 points  Total Score 0 0    Immunizations Immunization History  Administered Date(s) Administered   Influenza Inj Mdck Quad Pf 09/11/2016, 09/08/2018   Influenza, Quadrivalent, Recombinant, Inj, Pf 09/15/2019   Influenza-Unspecified 09/09/2019   Pneumococcal Polysaccharide-23 08/10/2015, 09/27/2020   Td 12/09/2014    TDAP status: Up to date  Flu Vaccine status: Up to date  Pneumococcal vaccine status: Up to date  Covid-19 vaccine status: Declined, Education has been provided regarding the importance of this vaccine but patient still declined. Advised  may receive this vaccine at local pharmacy or Health Dept.or vaccine clinic. Aware to provide a copy of the vaccination record if obtained from local pharmacy or Health Dept. Verbalized acceptance and understanding.  Qualifies for Shingles Vaccine? Yes   Zostavax completed No   Shingrix Completed?: Yes  Screening Tests Health Maintenance  Topic Date Due   Zoster Vaccines- Shingrix (1 of 2) Never done   INFLUENZA VACCINE  03/08/2022 (Originally 07/09/2021)   OPHTHALMOLOGY EXAM  11/30/2021   HEMOGLOBIN A1C  02/01/2022   FOOT EXAM  04/03/2022   MAMMOGRAM  08/10/2023   PAP SMEAR-Modifier  05/08/2024   TETANUS/TDAP  12/09/2024   COLONOSCOPY (Pts 45-49yrs Insurance coverage will need to be confirmed)  09/29/2029   HIV Screening  Completed   HPV VACCINES  Aged Out   COVID-19 Vaccine  Discontinued   Hepatitis C Screening  Discontinued    Health Maintenance  Health Maintenance Due  Topic Date Due   Zoster Vaccines- Shingrix (1 of 2) Never done    Colorectal cancer screening: Type of screening: Colonoscopy. Completed 09/30/2019. Repeat every 10 years  Mammogram status: Completed 08/09/2021. Repeat every year  Bone Density status: Ordered Not due until age 41. Pt provided with contact info and advised to call to schedule appt.  Lung Cancer Screening: (Low Dose CT Chest recommended if Age 20-80 years, 30 pack-year currently smoking OR have quit w/in 15years.) does not qualify.    Additional Screening:  Hepatitis C Screening: does not qualify.  Vision Screening: Recommended annual ophthalmology exams for early detection of glaucoma and other disorders of the eye. Is the patient up to date with their annual eye exam?  Yes  Who is the provider or what is the name of the office in which the patient attends annual eye exams? [facility] [place] If pt is not established with a provider, would they like to be referred to a provider to establish care? No .   Dental Screening: Recommended  annual dental exams for proper oral hygiene  Community Resource Referral / Chronic Care Management: CRR required this visit?  No   CCM required this visit?  No      Plan:     I have personally reviewed and noted the following in the patient's chart:   Medical and social history Use of alcohol, tobacco or illicit drugs  Current medications and supplements including opioid prescriptions.  Functional ability and status Nutritional status Physical activity Advanced directives List of other physicians Hospitalizations, surgeries, and ER visits in previous 12 months Vitals Screenings to include cognitive, depression, and falls Referrals and appointments  In addition, I have reviewed and discussed with patient certain preventive protocols, quality metrics, and best practice recommendations. A written personalized care plan for preventive services as well as general preventive health recommendations were provided to patient.     Chriss Driver, LPN   12/12/457   Nurse Notes: Pt states she is doing well. Excited about 9# weight loss. Pt up to date on all health screenings. Pt received flu vaccine today, in our office.    I have read and agreed with the contents of this note. JOSEPH Jonetta Osgood    08/27/2021

## 2021-09-05 ENCOUNTER — Other Ambulatory Visit: Payer: Self-pay

## 2021-09-05 ENCOUNTER — Ambulatory Visit (INDEPENDENT_AMBULATORY_CARE_PROVIDER_SITE_OTHER): Payer: Medicare Other | Admitting: Pharmacist

## 2021-09-05 DIAGNOSIS — N182 Chronic kidney disease, stage 2 (mild): Secondary | ICD-10-CM

## 2021-09-05 DIAGNOSIS — I10 Essential (primary) hypertension: Secondary | ICD-10-CM

## 2021-09-05 DIAGNOSIS — E785 Hyperlipidemia, unspecified: Secondary | ICD-10-CM

## 2021-09-05 DIAGNOSIS — I5032 Chronic diastolic (congestive) heart failure: Secondary | ICD-10-CM

## 2021-09-05 DIAGNOSIS — G459 Transient cerebral ischemic attack, unspecified: Secondary | ICD-10-CM

## 2021-09-05 DIAGNOSIS — E119 Type 2 diabetes mellitus without complications: Secondary | ICD-10-CM

## 2021-09-05 DIAGNOSIS — J4540 Moderate persistent asthma, uncomplicated: Secondary | ICD-10-CM

## 2021-09-05 NOTE — Patient Instructions (Signed)
[name],  It was great to talk to you today!  Please call me with any questions or concerns.   Visit Information  PATIENT GOALS:  Goals Addressed             This Visit's Progress    Medication Management       Patient Goals/Self-Care Activities Over the next 90  days, patient will:  Take medications as prescribed Check blood sugar once a day at the following times: fasting (at least 8 hours since last food consumption) and whenever patient experiences symptoms of hypo/hyperglycemia, document, and provide at future appointments Weigh daily, and contact provider if weight gain of more than 3 lbs in 1 day or more than 5 lbs in 1 week Target a minimum of 150 minutes of moderate intensity exercise weekly          Patient verbalizes understanding of instructions provided today and agrees to view in [place].   Face to Face appointment with care management team member scheduled for: 11/06/21 Next PCP appointment scheduled for: 09/28/21  Kennon Holter, PharmD Clinical Pharmacist [facility] [phone]

## 2021-09-05 NOTE — Chronic Care Management (AMB) (Signed)
Chronic Care Management Pharmacy Note  09/05/2021 Name:  Erin Sexton MRN:  [id number] DOB:  09-15-1971  Summary:  Patient is tolerating Trulicity well. Does note decreased appetite and some mild nausea. Patient instructed to increase to dulaglutide (Trulicity) 1.5 mg subcutaneously weekly. Can continue to titrate until goal blood glucose and weight loss achieved Patient instructed to consider addition of prescription fish oil to lower triglycerides   Heart failure/Chronic kidney disease: Consider adding spironolactone and discontinuing potassium supplementation  Subjective: Erin Sexton is an 50 y.o. year old female who is a primary patient of [name], NP.  The CCM team was consulted for assistance with disease management and care coordination needs.    Engaged with patient face to face for follow up visit in response to provider referral for pharmacy case management and/or care coordination services.   Consent to Services:  The patient was given information about Chronic Care Management services, agreed to services, and gave verbal consent prior to initiation of services.  Please see initial visit note for detailed documentation.   Patient Care Team: [name], NP as PCP - General (Nurse Practitioner) Herminio Commons, MD (Inactive) as PCP - Cardiology (Cardiology) Beryle Lathe, [facility] (Pharmacist)  Objective:  Lab Results  Component Value Date   CREATININE 1.21 (H) 08/01/2021   CREATININE 1.28 (H) 04/03/2021   CREATININE 1.35 (H) 01/01/2021    Lab Results  Component Value Date   HGBA1C 8.3 (H) 08/01/2021   Last diabetic Eye exam:  Lab Results  Component Value Date/Time   HMDIABEYEEXA No Retinopathy 11/29/2020 12:00 AM    Last diabetic Foot exam: No results found for: HMDIABFOOTEX      Component Value Date/Time   CHOL 101 08/01/2021 0933   TRIG 233 (H) 08/01/2021 0933   HDL 33 (L) 08/01/2021 0933   CHOLHDL 2.7 06/27/2020 1355    VLDL 18 06/21/2016 0510   LDLCALC 32 08/01/2021 0933   LDLCALC 21 06/27/2020 1355    Hepatic Function Latest Ref Rng & Units 08/01/2021 01/01/2021 09/27/2020  Total Protein 6.0 - 8.5 g/dL 6.5 6.5 6.9  Albumin 3.8 - 4.9 g/dL 4.3 - -  AST 0 - 40 IU/L $Remov'18 18 17  'DaxIsS$ ALT 0 - 32 IU/L $Remov'19 17 22  'HlpPJY$ Alk Phosphatase 44 - 121 IU/L 124(H) - -  Total Bilirubin 0.0 - 1.2 mg/dL 0.6 0.7 0.9  Bilirubin, Direct 0.0 - 0.3 mg/dL - - -    Lab Results  Component Value Date/Time   TSH 0.981 08/01/2021 09:33 AM   TSH 1.37 09/27/2020 02:17 PM   FREET4 1.84 (H) 08/01/2021 09:33 AM   FREET4 1.6 06/27/2020 01:55 PM    CBC Latest Ref Rng & Units 08/01/2021 01/31/2019 02/22/2017  WBC 3.4 - 10.8 x10E3/uL 11.6(H) 11.5(H) 6.5  Hemoglobin 11.1 - 15.9 g/dL 16.5(H) 14.4 13.6  Hematocrit 34.0 - 46.6 % 46.9(H) 43.4 39.0  Platelets 150 - 450 x10E3/uL 277 236 220    Lab Results  Component Value Date/Time   VD25OH 95.7 08/01/2021 09:33 AM   VD25OH 79 08/10/2020 02:57 PM    Clinical ASCVD: Yes  The ASCVD Risk score (Arnett DK, et al., 2019) failed to calculate for the following reasons:   The valid total cholesterol range is 130 to 320 mg/dL    Social History   Tobacco Use  Smoking Status Never  Smokeless Tobacco Never   BP Readings from Last 3 Encounters:  08/27/21 127/78  08/08/21 127/70  08/01/21 120/63  Pulse Readings from Last 3 Encounters:  08/27/21 75  08/08/21 70  08/01/21 78   Wt Readings from Last 3 Encounters:  08/27/21 (!) 304 lb (137.9 kg)  08/08/21 (!) 313 lb (142 kg)  08/01/21 (!) 310 lb 1.9 oz (140.7 kg)    Assessment: Review of patient past medical history, allergies, medications, health status, including review of consultants reports, laboratory and other test data, was performed as part of comprehensive evaluation and provision of chronic care management services.   SDOH:  (Social Determinants of Health) assessments and interventions performed:    CCM Care Plan  No Known  Allergies  Medications Reviewed Today     Reviewed by Beryle Lathe, [facility] (Pharmacist) on 09/05/21 at [phone]  Med List Status: <None>   Medication Order Taking? Sig Documenting Provider Last Dose Status Informant  Accu-Chek Softclix Lancets lancets [id number]  USE 1 LANCET TO CHECK GLUCOSE ONCE DAILY [name], NP  Active   albuterol (PROVENTIL) (5 MG/ML) 0.5% nebulizer solution [id number] Yes Take 0.5 mLs (2.5 mg total) by nebulization every 6 (six) hours as needed for wheezing or shortness of breath. Ailene Ards, NP Taking Active            Med Note Langston Masker, Oretha Milch Aug 27, 2021 10:28 AM) PRN  albuterol (VENTOLIN HFA) 108 (90 Base) MCG/ACT inhaler [id number] Yes Inhale 2 puffs into the lungs as needed (wheezing).  [provider] Taking Active Self           Med Note Vernell Barrier Aug 08, 2021  8:57 AM) Uses daily  atorvastatin (LIPITOR) 80 MG tablet [id number] Yes Take 1 tablet (80 mg total) by mouth daily. Ailene Ards, NP Taking Active   blood glucose meter kit and supplies KIT [id number]  Dispense based on patient and insurance preference. Use once a day. Diagnosis code:E11.9 Doree Albee, MD  Active   buPROPion (WELLBUTRIN XL) 300 MG 24 hr tablet [id number] Yes Take 1 tablet (300 mg total) by mouth daily. Ailene Ards, NP Taking Active   cetirizine [facility] ALLERGY) 10 MG tablet [id number] Yes Take 1 tablet (10 mg total) by mouth daily. Hall-Potvin, [place], PA-C Taking Active   Chlorphen-PE-Acetaminophen (NOREL AD) 4-10-325 MG TABS [id number] No Take 1 tablet by mouth every 4 (four) hours as needed (nasal congestion, cold symptoms).  Patient not taking: No sig reported   [name], NP Not Taking Active   Cholecalciferol (VITAMIN D3 PO) [id number] Yes Take 10,000 Units by mouth daily. [provider] Taking Active   clindamycin (CLEOCIN T) 1 % lotion [id number] Yes  [provider] Taking Active            Med Note  Jeral Fruit Aug 27, 2021 10:29 AM) PRN  clotrimazole-betamethasone Donalynn Furlong) cream [id number] Yes APPLY  CREAM TOPICALLY TWICE DAILY [name], NP Taking Active   Dulaglutide (TRULICITY) 2.77 OE/4.2PN SOPN [id number] Yes Inject 0.75 mg into the skin once a week. [name], NP Taking Active   Dulaglutide (TRULICITY) 1.5 MG/8.6PY SOPN [id number] No Inject 1.5 mg into the skin once a week.  Patient not taking: No sig reported   [name], NP Not Taking Active   estradiol (ESTRACE) 2 MG tablet [id number] Yes Take 1 tablet (2 mg total) by mouth at bedtime. Florian Buff, MD Taking Active   glucose blood (ACCU-CHEK GUIDE) test strip [id number]  USE 1  STRIP TO CHECK GLUCOSE ONCE DAILY [name], NP  Active   JARDIANCE 10 MG TABS tablet [id number] Yes Take 1 tablet (10 mg total) by mouth daily. Ailene Ards, NP Taking Active   Lancet Device MISC [id number]  Use to check your blood sugar daily Ailene Ards, NP  Active   levonorgestrel [facility]) 20 MCG/24HR IUD [id number] Yes 1 each by Intrauterine route once. [provider] Taking Active   levothyroxine (SYNTHROID) 150 MCG tablet [id number] Yes Take 1 tablet (150 mcg total) by mouth daily. [name], NP Taking Active   lisinopril (ZESTRIL) 2.5 MG tablet [id number] Yes Take 1 tablet (2.5 mg total) by mouth daily. Ailene Ards, NP Taking Active   Magnesium 200 MG TABS [id number] Yes Take 1 tablet by mouth daily. [provider] Taking Active Self  megestrol (MEGACE) 40 MG tablet [id number] Yes Take 1 tablet by mouth once daily Florian Buff, MD Taking Active   metFORMIN (GLUCOPHAGE) 500 MG tablet [id number] Yes Take 2 tablets (1,000 mg total) by mouth 2 (two) times daily. Ailene Ards, NP Taking Active   metoprolol tartrate (LOPRESSOR) 50 MG tablet [id number] Yes Take 1 tablet (50 mg total) by mouth 2 (two) times daily. Ailene Ards, NP Taking Active   potassium chloride SA (KLOR-CON) 20 MEQ tablet [id number]   Take 1 tablet (20 mEq total) by mouth 2 (two) times daily. Ailene Ards, NP  Expired 09/01/21 2359   SYMBICORT 160-4.5 MCG/ACT inhaler [id number] Yes Inhale 2 puffs into the lungs as needed. [name], NP Taking Active   torsemide (DEMADEX) 20 MG tablet [id number] Yes Take 1 tablet (20 mg total) by mouth 2 (two) times daily. Ailene Ards, NP Taking Active             Patient Active Problem List   Diagnosis Date Noted   Leg swelling 08/08/2021   Nasal congestion 08/08/2021   Hypothyroidism 08/08/2021   CRI (chronic renal insufficiency), stage 2 (mild) 10/24/2020   Vitamin D deficiency disease 07/25/2020   TIA (transient ischemic attack) 06/20/2016   Chronic diastolic CHF (congestive heart failure) ([place]) 06/20/2016   Complex endometrial hyperplasia without atypia 06/08/2014   Menorrhagia 06/01/2014   Physical deconditioning 05/25/2014   Acute on chronic diastolic congestive heart failure ([place]) 05/18/2014   Diabetes mellitus type 2, noninsulin dependent ([place])    History of sleep apnea 03/21/2014   HOCM (hypertrophic obstructive cardiomyopathy) ([place]) 03/06/2014   Morbid obesity ([place]) 03/02/2014   Essential hypertension, benign 03/02/2014   Asthma, chronic 03/02/2014    Immunization History  Administered Date(s) Administered   Influenza Inj Mdck Quad Pf 09/11/2016, 09/08/2018   Influenza, Quadrivalent, Recombinant, Inj, Pf 09/15/2019   Influenza-Unspecified 09/09/2019, 08/27/2021   Pneumococcal Polysaccharide-23 08/10/2015, 09/27/2020   Td 12/09/2014    Conditions to be addressed/monitored: CHF, HTN, HLD, DMII, and asthma  Care Plan : Medication Management  Updates made by Beryle Lathe, [place] since 09/05/2021 12:00 AM     Problem: HLD/TIA, CHF, HTN, T2DM, Asthma   Priority: High  Onset Date: 08/08/2021     Long-Range Goal: Disease Progression Prevention   Start Date: 08/08/2021  Expected End Date: 11/06/2021  Recent Progress: On track  Priority: High   Note:   Current Barriers:  Unable to achieve control of diabetes Suboptimal therapeutic regimen for heart failure , weight management, and chronic kidney disease   Pharmacist Clinical Goal(s):  Over the next 90  days, patient will Achieve control  of diabetes as evidenced by improved A1c Adhere to plan to optimize therapeutic regimen for heart failure , weight management, and chronic kidney disease as evidenced by report of adherence to recommended medication management changes through collaboration with PharmD and provider.   Interventions: 1:1 collaboration with [name], NP regarding development and update of comprehensive plan of care as evidenced by provider attestation and co-signature Inter-disciplinary care team collaboration (see longitudinal plan of care) Comprehensive medication review performed; medication list updated in electronic medical record  Type 2 Diabetes: Current medications: metformin 1,000 mg by mouth twice daily with meals, dulaglutide (Trulicity) 2.20 mg subcutaneously weekly, and empagliflozin (Jardiance) 10 mg by mouth once daily Intolerances: none Taking medications as directed: yes Side effects thought to be attributed to current medication regimen: no Denies hypoglycemic/hyperglycemic symptoms Hypoglycemia prevention: not indicated at this time Current meal patterns: breakfast: does intermittent fasting and eats mostly baked/grilled chicken and vegetables; drinks: water Current exercise:  walks every day at least 1 mile On a statin: $RemoveB'[x]'nzRRmrlj$  Yes  $Re'[]'jRg$  No    Last microalbumin: 12 (06/27/20); on an ACEi/ARB: $RemoveBefo'[x]'DuWLdStYpZy$  Yes  $Re'[]'yLU$  No    Last eye exam: goes every December per patient  Last foot exam: completed within last year by Dr. Anastasio Champion Pneumonia vaccine: up to date Current glucose readings: fasting blood glucose 132-170 which is above goal of 80-130 per ADA guidelines Uncontrolled; Most recent A1c above goal of <7% per ADA guidelines Introduction to diabetes basic  facts Monitoring: target blood sugar range, when to test Continue metformin 1,000 mg by mouth twice daily with meals and empagliflozin (Jardiance) 10 mg by mouth once daily Patient is tolerating Trulicity well. Does note decreased appetite and some mild nausea. Patient instructed to increase to dulaglutide (Trulicity) 1.5 mg subcutaneously weekly.  Encouraged regular aerobic exercise with a goal of 30 minutes five times per week (150 minutes per week) Patient was instructed on appropriate self administration technique for injection of dulaglutide (Trulicity)   Hyperlipidemia/History of TIA (2017): Current medications: atorvastatin 80 mg by mouth once daily Intolerances: none Taking medications as directed: yes Side effects thought to be attributed to current medication regimen: no Controlled; LDL at goal of <70 due to very high risk given established clinical ASCVD (TIA in 2017) per 2020 AACE/ACE guidelines and TG above goal of <150 per 2020 AACE/ACE guidelines Continue atorvastatin 80 mg by mouth once daily Patient instructed to consider addition of prescription fish oil to lower triglycerides  Recommend regular aerobic exercise Discussed need for and importance of continued work on weight loss Discussed need for medication compliance Patient plans to discuss whether or not she needs low dose aspirin for secondary prevention  Overweight/Obesity Unable to achieve goal weight loss through lifestyle modification alone Referral placed by PCP for plastic surgery to consider removal of her pannis Current treatment: dulaglutide (Trulicity) 2.54 mg subcutaneously weekly Medications/Strategies previously tried:  none ; Fasting/Modified Fasting Baseline weight: 361 lbs; most recent weight: 300 lbs Extensive dietary counseling including education on focus on lean proteins, fruits and vegetables, whole grains and increased fiber consumption, adequate hydration Recommend diet modification to induce  energy deficit of 500 kcal/day or greater Encouraged regular aerobic exercise with a goal of 30 minutes five times per week (150 minutes per week) Plan to increase to dulaglutide (Trulicity) 1.5 mg subcutaneously weekly  Heart failure with preserved ejection fraction (LVEF >50% with evidence of spontaneous or provokable increased LV filling pressures) & Hypertension: Suboptimally managed Current treatment: lisinopril 2.5 mg by mouth  once daily, metoprolol tartrate 50 mg by mouth twice daily, empagliflozin (Jardiance) 10 mg by mouth once daily, and torsemide 20 mg twice daily Stage C (Symptomatic heart failure)/NYHA Class II (Slight limitation of physical activity. Comfortable at rest. Ordinary physical activity results in fatigue, palpitation, or dyspnea) Most recent echocardiogram was on in 2017? Current home blood pressure: does not have a machine to check Current home weights: checks daily (300 lbs on 09/05/21) Lower extremity edema: [x]  Yes (minimal)  []   No  []   Undetermined Jugular venous distention: []  Yes  []   No  [x]   Undetermined due to body habitus Ascites: []  Yes  [x]   No  []   Undetermined Denies dyspnea at rest or on exertion and reduced exercise capacity . Reports orthopnea (sleeps in recliner) Continue lisinopril 2.5 mg by mouth once daily, metoprolol tartrate 50 mg by mouth twice daily, empagliflozin (Jardiance) 10 mg by mouth once daily, and torsemide 20 mg twice daily Consider adding spironolactone and discontinuing potassium supplementation Encourage dietary sodium restriction (<3 g/day) Educated on the importance of weighing daily. Patient aware to contact cardiology/primary care team if weight gain >3 lbs in 1 day or >5 lbs in 1 week  Asthma: Controlled Current treatment: albuterol metered dose (ProAir, Ventolin, Proventil) 1 puff by mouth as needed for shortness of breath and budesonide/formoterol (Symbicort HFA) 160-4.5 mcg 2 puffs by mouth once daily Continue albuterol  metered dose (ProAir, Ventolin, Proventil) 1 puff by mouth as needed for shortness of breath and budesonide/formoterol (Symbicort HFA) 160-4.5 mcg 2 puffs by mouth once daily Recommend allergen avoidance (environmental control) Encouraged regular physical activity for general health benefits Reminded patient to rinse mouth with water and spit after using ICS containing inhaler to prevent thrush  Chronic Kidney Disease Stage 3a - GFR 45-59 (Mild to Moderately Reduced Function): Current medications: lisinopril 2.5 mg by mouth once daily, empagliflozin (Jardiance) 10 mg by mouth once daily, metformin 1,000 mg by mouth twice daily, dulaglutide (Trulicity) 9.35 mg subcutaneously weekly, and atorvastatin 80 mg by mouth once daily Intolerances: none Taking medications as directed: yes Side effects thought to be attributed to current medication regimen: no Denies dizziness, lightheadedness, blurred vision, and headache Most recent GFR: 54 mL/min Most recent microalbumin: 12 (06/27/20) Continue medications as above Increase dose of GLP-1 agonist as above Consider addition of spironolactone Encouraged healthy diet including more fruits and vegetables Discussed need for and importance of continued work on weight loss Discussed need for improved control of blood sugar  Patient Goals/Self-Care Activities Over the next 90  days, patient will:  Take medications as prescribed Check blood sugar once a day at the following times: fasting (at least 8 hours since last food consumption) and whenever patient experiences symptoms of hypo/hyperglycemia, document, and provide at future appointments Weigh daily, and contact provider if weight gain of more than 3 lbs in 1 day or more than 5 lbs in 1 week Target a minimum of 150 minutes of moderate intensity exercise weekly  Follow Up Plan: Face to Face appointment with care management team member scheduled for: 11/06/21      Medication Assistance: None required.   Patient affirms current coverage meets needs.  Patient's preferred pharmacy is:  [place], [place] - [address] [place] [address] Phone: [phone] Fax: [place], [address] AT [place]. [place]  [place] [address] Phone: [phone] Fax: [phone]  Follow Up:  Patient agrees to Care Plan and Follow-up.  Plan: Face to Face appointment with care management team member scheduled for: 11/06/21 and Next PCP appointment scheduled for: 09/28/21  Kennon Holter, PharmD Clinical Pharmacist [facility] [phone]

## 2021-09-07 DIAGNOSIS — I10 Essential (primary) hypertension: Secondary | ICD-10-CM | POA: Diagnosis not present

## 2021-09-07 DIAGNOSIS — I5032 Chronic diastolic (congestive) heart failure: Secondary | ICD-10-CM | POA: Diagnosis not present

## 2021-09-07 DIAGNOSIS — J4540 Moderate persistent asthma, uncomplicated: Secondary | ICD-10-CM | POA: Diagnosis not present

## 2021-09-07 DIAGNOSIS — G459 Transient cerebral ischemic attack, unspecified: Secondary | ICD-10-CM

## 2021-09-07 DIAGNOSIS — E119 Type 2 diabetes mellitus without complications: Secondary | ICD-10-CM

## 2021-09-07 DIAGNOSIS — E785 Hyperlipidemia, unspecified: Secondary | ICD-10-CM

## 2021-09-07 DIAGNOSIS — N182 Chronic kidney disease, stage 2 (mild): Secondary | ICD-10-CM | POA: Diagnosis not present

## 2021-09-28 ENCOUNTER — Ambulatory Visit: Payer: Medicare Other | Admitting: Nurse Practitioner

## 2021-10-02 ENCOUNTER — Other Ambulatory Visit (INDEPENDENT_AMBULATORY_CARE_PROVIDER_SITE_OTHER): Payer: Self-pay | Admitting: Nurse Practitioner

## 2021-10-02 DIAGNOSIS — I10 Essential (primary) hypertension: Secondary | ICD-10-CM

## 2021-10-02 DIAGNOSIS — E119 Type 2 diabetes mellitus without complications: Secondary | ICD-10-CM

## 2021-10-02 DIAGNOSIS — J4540 Moderate persistent asthma, uncomplicated: Secondary | ICD-10-CM

## 2021-10-02 DIAGNOSIS — I5032 Chronic diastolic (congestive) heart failure: Secondary | ICD-10-CM

## 2021-10-02 DIAGNOSIS — E559 Vitamin D deficiency, unspecified: Secondary | ICD-10-CM

## 2021-10-02 DIAGNOSIS — E785 Hyperlipidemia, unspecified: Secondary | ICD-10-CM

## 2021-10-03 ENCOUNTER — Encounter: Payer: Self-pay | Admitting: Nurse Practitioner

## 2021-10-03 DIAGNOSIS — I5032 Chronic diastolic (congestive) heart failure: Secondary | ICD-10-CM

## 2021-10-03 DIAGNOSIS — J4540 Moderate persistent asthma, uncomplicated: Secondary | ICD-10-CM

## 2021-10-03 DIAGNOSIS — E119 Type 2 diabetes mellitus without complications: Secondary | ICD-10-CM

## 2021-10-03 DIAGNOSIS — E559 Vitamin D deficiency, unspecified: Secondary | ICD-10-CM

## 2021-10-03 DIAGNOSIS — I10 Essential (primary) hypertension: Secondary | ICD-10-CM

## 2021-10-03 DIAGNOSIS — E785 Hyperlipidemia, unspecified: Secondary | ICD-10-CM

## 2021-10-03 MED ORDER — POTASSIUM CHLORIDE CRYS ER 20 MEQ PO TBCR: 20.0000 meq | EXTENDED_RELEASE_TABLET | Freq: Two times a day (BID) | ORAL | 2 refills | Status: DC

## 2021-10-03 MED ORDER — METFORMIN HCL 500 MG PO TABS: 1000.0000 mg | ORAL_TABLET | Freq: Two times a day (BID) | ORAL | 0 refills | Status: DC

## 2021-10-21 ENCOUNTER — Other Ambulatory Visit (INDEPENDENT_AMBULATORY_CARE_PROVIDER_SITE_OTHER): Payer: Self-pay | Admitting: Nurse Practitioner

## 2021-10-21 DIAGNOSIS — E119 Type 2 diabetes mellitus without complications: Secondary | ICD-10-CM

## 2021-10-22 MED ORDER — JARDIANCE 10 MG PO TABS: 10.0000 mg | ORAL_TABLET | Freq: Every day | ORAL | 0 refills | Status: DC

## 2021-10-25 ENCOUNTER — Ambulatory Visit: Payer: Medicare Other | Admitting: Cardiology

## 2021-10-25 ENCOUNTER — Other Ambulatory Visit: Payer: Self-pay | Admitting: Nurse Practitioner

## 2021-10-25 DIAGNOSIS — I5032 Chronic diastolic (congestive) heart failure: Secondary | ICD-10-CM

## 2021-10-25 DIAGNOSIS — E559 Vitamin D deficiency, unspecified: Secondary | ICD-10-CM

## 2021-10-25 DIAGNOSIS — E119 Type 2 diabetes mellitus without complications: Secondary | ICD-10-CM

## 2021-10-25 DIAGNOSIS — E785 Hyperlipidemia, unspecified: Secondary | ICD-10-CM

## 2021-10-25 DIAGNOSIS — I10 Essential (primary) hypertension: Secondary | ICD-10-CM

## 2021-10-25 DIAGNOSIS — J4540 Moderate persistent asthma, uncomplicated: Secondary | ICD-10-CM

## 2021-10-25 NOTE — Progress Notes (Signed)
Cardiology Office Note:    Date:  10/26/2021   ID:  Erin Sexton, DOB 1971/10/14, MRN [id number]  PCP:  [name], NP  Cardiologist:  Kate Sable, MD (Inactive)  Electrophysiologist:  None   Referring MD: [name], NP   Chief Complaint  Patient presents with   Cardiomyopathy     History of Present Illness:    Erin Sexton is a 50 y.o. female with a hx of hypertension, COPD, asthma, non-insulin-dependent diabetes, morbid obesity, chronic diastolic dysfunction, sleep apnea, dyslipidemia, and a past history of HOCM by echocardiogram in 2017 who presents for follow-up.  She denies any chest pain or lower extremity edema.  Does report chronic shortness of breath which she attributes to asthma.  Reports occasional lightheadedness with standing but denies any syncope.  Reports rare palpitations, last for few seconds.  She is taking torsemide.  Walks every day for 30 minutes.  Echocardiogram 06/21/2016 showed severe asymmetric LVH, LVOT obstruction with peak gradient 48 mmHg, grade 1 diastolic dysfunction.   Past Medical History:  Diagnosis Date   Abnormal CT scan    a. 02/2014: evaluated for dyspnea and initially placed on Xarelto for PE because radiologist could not exclude small peripheral pulmonary emboli because of limited contrast. However, after admission for ++vaginal bleeding several days later, it was felt she did NOT have PE - underwent further testing with neg VQ 03/06/14, neg LE duplex.   Anemia    Anxiety    Arthritis    Asthma    Blood transfusion without reported diagnosis    Bone spur of right foot    CHF (congestive heart failure) (HCC)    COPD (chronic obstructive pulmonary disease) ([place])    Depression    Diabetes mellitus without complication ([place])    a. Dx 02/2014.   Heart murmur    History of echocardiogram 2015   normal EF, Grade II diastolic dysfunction   HOCM (hypertrophic obstructive cardiomyopathy) ([place])    a. Dx [address].    Hyperlipidemia    Hypertension    Morbid obesity (HCC)    NSVT (nonsustained ventricular tachycardia)    a. Noted on tele during 02/2014 adm for symptomatic anemia.   Stroke [facility])    SVT (supraventricular tachycardia) ([place])    a. Noted on tele during 02/2014 adm for symptomatic anemia.   Thyroid disease    Vaginal bleeding     Past Surgical History:  Procedure Laterality Date   COLONOSCOPY N/A 09/30/2019   Procedure: COLONOSCOPY;  Surgeon: Rogene Houston, MD;  Location: [facility] ENDO SUITE;  Service: Endoscopy;  Laterality: N/A;  [place] N/A 08/07/2016   Procedure: DILATATION & CURETTAGE/HYSTEROSCOPY WITH ATTPEMPTED NOVASURE ABLATION AND INSERTION OF IUD;  Surgeon: Florian Buff, MD;  Location: [facility] ORS;  Service: Gynecology;  Laterality: N/A;    Current Medications: Current Meds  Medication Sig   Accu-Chek Softclix Lancets lancets USE 1 LANCET TO CHECK GLUCOSE ONCE DAILY   albuterol (PROVENTIL) (5 MG/ML) 0.5% nebulizer solution Take 0.5 mLs (2.5 mg total) by nebulization every 6 (six) hours as needed for wheezing or shortness of breath.   albuterol (VENTOLIN HFA) 108 (90 Base) MCG/ACT inhaler Inhale 2 puffs into the lungs as needed (wheezing).    atorvastatin (LIPITOR) 80 MG tablet Take 1 tablet (80 mg total) by mouth daily.   blood glucose meter kit and supplies KIT Dispense based on patient and insurance preference. Use once a day. Diagnosis  code:E11.9   buPROPion (WELLBUTRIN XL) 300 MG 24 hr tablet Take 1 tablet (300 mg total) by mouth daily.   cetirizine (ZYRTEC ALLERGY) 10 MG tablet Take 1 tablet (10 mg total) by mouth daily.   Cholecalciferol (VITAMIN D3 PO) Take 10,000 Units by mouth daily.   clotrimazole-betamethasone (LOTRISONE) cream APPLY CREAM TOPICALLY TWICE DAILY   Dulaglutide (TRULICITY) 1.5 KZ/6.0FU SOPN Inject 1.5 mg into the skin once a week.   estradiol (ESTRACE) 2 MG tablet Take 1 tablet (2 mg total) by mouth at  bedtime.   glucose blood (ACCU-CHEK GUIDE) test strip USE 1 STRIP TO CHECK GLUCOSE ONCE DAILY   JARDIANCE 10 MG TABS tablet Take 1 tablet (10 mg total) by mouth daily.   Lancet Device MISC Use to check your blood sugar daily   levonorgestrel (MIRENA) 20 MCG/24HR IUD 1 each by Intrauterine route once.   levothyroxine (SYNTHROID) 150 MCG tablet Take 1 tablet (150 mcg total) by mouth daily.   lisinopril (ZESTRIL) 2.5 MG tablet Take 1 tablet (2.5 mg total) by mouth daily.   Magnesium 200 MG TABS Take 1 tablet by mouth daily.   megestrol (MEGACE) 40 MG tablet Take 1 tablet by mouth once daily   metFORMIN (GLUCOPHAGE) 500 MG tablet Take 2 tablets (1,000 mg total) by mouth 2 (two) times daily.   metoprolol tartrate (LOPRESSOR) 50 MG tablet Take 1 tablet (50 mg total) by mouth 2 (two) times daily.   potassium chloride SA (KLOR-CON) 20 MEQ tablet Take 1 tablet (20 mEq total) by mouth 2 (two) times daily.   SYMBICORT 160-4.5 MCG/ACT inhaler Inhale 2 puffs into the lungs as needed.   torsemide (DEMADEX) 20 MG tablet Take 1 tablet (20 mg total) by mouth 2 (two) times daily.     Allergies:   Patient has no known allergies.   Social History   Socioeconomic History   Marital status: Divorced    Spouse name: Not on file   Number of children: 0   Years of education: Not on file   Highest education level: Not on file  Occupational History   Occupation: Disabled  Tobacco Use   Smoking status: Never   Smokeless tobacco: Never  Vaping Use   Vaping Use: Never used  Substance and Sexual Activity   Alcohol use: No   Drug use: No   Sexual activity: Not Currently    Birth control/protection: I.U.D., Post-menopausal    Comment: IUD in place to control vaginal bleeding  Other Topics Concern   Not on file  Social History Narrative   Divorced for 3 years,married for 5 years.On disability since 2009 secondary to mental illness.   Social Determinants of Health   Financial Resource Strain: Low Risk     Difficulty of Paying Living Expenses: Not hard at all  Food Insecurity: No Food Insecurity   Worried About Charity fundraiser in the Last Year: Never true   [place] in the Last Year: Never true  Transportation Needs: No Transportation Needs   Lack of Transportation (Medical): No   Lack of Transportation (Non-Medical): No  Physical Activity: Insufficiently Active   Days of Exercise per Week: 7 days   Minutes of Exercise per Session: 20 min  Stress: No Stress Concern Present   Feeling of Stress : Not at all  Social Connections: Socially Isolated   Frequency of Communication with Friends and Family: More than three times a week   Frequency of Social Gatherings with Friends and Family: More  than three times a week   Attends Religious Services: Never   Active Member of Clubs or Organizations: No   Attends Archivist Meetings: Never   Marital Status: Divorced     Family History: The patient's family history includes Arthritis in her maternal grandmother and mother; Asthma in her mother; Bipolar disorder in her sister; Depression in her sister; Diabetes in her mother; Heart disease in her father, maternal grandfather, and sister; Hyperlipidemia in her maternal grandmother; Hypertension in her father; Mental illness in her maternal grandmother, mother, and sister; Obesity in her mother; Other in her mother; Uterine cancer in her maternal grandmother; Varicose Veins in her sister. There is no history of CAD.  ROS:   Please see the history of present illness.     All other systems reviewed and are negative.  EKGs/Labs/Other Studies Reviewed:    The following studies were reviewed today:   EKG:  EKG is  ordered today.  The ekg ordered today demonstrates normal sinus rhythm, rate 78, left axis deviation, LVH  Recent Labs: 08/01/2021: ALT 19; Hemoglobin 16.5; Platelets 277 10/26/2021: BUN 20; Creatinine, Ser 1.42; Magnesium 2.2; Potassium 3.5; Sodium 140; TSH 3.175  Recent  Lipid Panel    Component Value Date/Time   CHOL 101 08/01/2021 0933   TRIG 233 (H) 08/01/2021 0933   HDL 33 (L) 08/01/2021 0933   CHOLHDL 2.7 06/27/2020 1355   VLDL 18 06/21/2016 0510   LDLCALC 32 08/01/2021 0933   LDLCALC 21 06/27/2020 1355    Physical Exam:    VS:  BP 130/80   Pulse 62   Ht _0  (1.753 m)   Wt 297 lb (134.7 kg)   SpO2 98%   BMI 43.86 kg/m     Wt Readings from Last 3 Encounters:  10/26/21 297 lb (134.7 kg)  08/27/21 (!) 304 lb (137.9 kg)  08/08/21 (!) 313 lb (142 kg)     GEN:  Well nourished, well developed in no acute distress HEENT: Normal NECK: No JVD; No carotid bruits CARDIAC: RRR, no murmurs, rubs, gallops RESPIRATORY:  Clear to auscultation without rales, wheezing or rhonchi  ABDOMEN: Soft, non-tender, non-distended MUSCULOSKELETAL:  No edema; No deformity  SKIN: Warm and dry NEUROLOGIC:  Alert and oriented x 3 PSYCHIATRIC:  Normal affect   ASSESSMENT:    1. HOCM (hypertrophic obstructive cardiomyopathy) ([place])   2. Essential hypertension   3. Chronic diastolic CHF (congestive heart failure) ([place])   4. OSA (obstructive sleep apnea)   5. Morbid obesity ([place])    PLAN:    Hypertrophic cardiomyopathy: Noted on echo in 2017.  Recommend repeat echo to evaluate for obstruction.  If consistent with HCM, will plan cardiac MRI  Hypertension: On lisinopril 2.5 mg daily and metoprolol 50 mg twice daily.  Appears controlled  Chronic diastolic heart failure: On torsemide 20 mg daily.  Appears euvolemic.  If does have HOCM as above, would favor switching diuretics to as needed as diuresis can worsen obstruction.  Will check BMP/magnesium  OSA: not using regularly.   T2DM: On Jardiance, metformin, Trulicity.  Follows with PCP, A1c 8.3% on 08/02/2019  Hyperlipidemia: On atorvastatin 80 mg daily.  LDL 33 on 08/01/2021  Morbid obesity: Body mass index is 43.86 kg/m.  Lost 33 lbs over last year.   RTC in 3 months  Medication Adjustments/Labs and  Tests Ordered: Current medicines are reviewed at length with the patient today.  Concerns regarding medicines are outlined above.  Orders Placed This Encounter  Procedures  Basic metabolic panel   Magnesium   EKG 12-Lead   ECHOCARDIOGRAM COMPLETE   No orders of the defined types were placed in this encounter.   Patient Instructions  Medication Instructions:  Your physician recommends that you continue on your current medications as directed. Please refer to the Current Medication list given to you today.   Labwork: BMET,Magnesium today   Testing/Procedures: Your physician has requested that you have an echocardiogram. Echocardiography is a painless test that uses sound waves to create images of your heart. It provides your doctor with information about the size and shape of your heart and how well your heart's chambers and valves are working. This procedure takes approximately one hour. There are no restrictions for this procedure.   Follow-Up: 3 months with MD  Any Other Special Instructions Will Be Listed Below (If Applicable).    If you need a refill on your cardiac medications before your next appointment, please call your pharmacy.   Signed, Donato Heinz, MD  10/26/2021 10:43 PM    [place]

## 2021-10-26 ENCOUNTER — Other Ambulatory Visit (HOSPITAL_COMMUNITY)
Admission: RE | Admit: 2021-10-26 | Discharge: 2021-10-26 | Disposition: A | Discharge status: Home / Self Care | Payer: Medicare Other | Source: Ambulatory Visit | Attending: Cardiology | Admitting: Cardiology

## 2021-10-26 ENCOUNTER — Other Ambulatory Visit: Payer: Self-pay

## 2021-10-26 ENCOUNTER — Ambulatory Visit (INDEPENDENT_AMBULATORY_CARE_PROVIDER_SITE_OTHER): Payer: Medicare Other | Admitting: Cardiology

## 2021-10-26 ENCOUNTER — Other Ambulatory Visit (HOSPITAL_COMMUNITY)
Admission: RE | Admit: 2021-10-26 | Discharge: 2021-10-26 | Disposition: A | Discharge status: Home / Self Care | Payer: Medicare Other | Source: Ambulatory Visit | Attending: Nurse Practitioner | Admitting: Nurse Practitioner

## 2021-10-26 ENCOUNTER — Encounter: Payer: Self-pay | Admitting: Cardiology

## 2021-10-26 VITALS — BP 130/80 | HR 62 | Ht 69.0 in | Wt 297.0 lb

## 2021-10-26 DIAGNOSIS — I421 Obstructive hypertrophic cardiomyopathy: Secondary | ICD-10-CM | POA: Insufficient documentation

## 2021-10-26 DIAGNOSIS — E039 Hypothyroidism, unspecified: Secondary | ICD-10-CM | POA: Diagnosis present

## 2021-10-26 DIAGNOSIS — G4733 Obstructive sleep apnea (adult) (pediatric): Secondary | ICD-10-CM | POA: Diagnosis not present

## 2021-10-26 DIAGNOSIS — I5032 Chronic diastolic (congestive) heart failure: Secondary | ICD-10-CM | POA: Diagnosis not present

## 2021-10-26 DIAGNOSIS — I10 Essential (primary) hypertension: Secondary | ICD-10-CM | POA: Diagnosis not present

## 2021-10-26 LAB — BASIC METABOLIC PANEL
Anion gap: 11 (ref 5–15)
BUN: 20 mg/dL (ref 6–20)
CO2: 26 mmol/L (ref 22–32)
Calcium: 9.2 mg/dL (ref 8.9–10.3)
Chloride: 103 mmol/L (ref 98–111)
Creatinine, Ser: 1.42 mg/dL — ABNORMAL HIGH (ref 0.44–1.00)
GFR, Estimated: 45 mL/min — ABNORMAL LOW (ref >60)
Glucose, Bld: 123 mg/dL — ABNORMAL HIGH (ref 70–99)
Potassium: 3.5 mmol/L (ref 3.5–5.1)
Sodium: 140 mmol/L (ref 135–145)

## 2021-10-26 LAB — T4, FREE: Free T4: 1.76 ng/dL — ABNORMAL HIGH (ref 0.61–1.12)

## 2021-10-26 LAB — TSH: TSH: 3.175 u[IU]/mL (ref 0.350–4.500)

## 2021-10-26 LAB — MAGNESIUM: Magnesium: 2.2 mg/dL (ref 1.7–2.4)

## 2021-10-26 MED ORDER — CLOTRIMAZOLE-BETAMETHASONE 1-0.05 % EX CREA
TOPICAL_CREAM | CUTANEOUS | 0 refills | Status: DC
Start: 2021-10-26 — End: 2022-02-12

## 2021-10-26 NOTE — Patient Instructions (Addendum)
Medication Instructions:  Your physician recommends that you continue on your current medications as directed. Please refer to the Current Medication list given to you today.   Labwork: BMET,Magnesium today   Testing/Procedures: Your physician has requested that you have an echocardiogram. Echocardiography is a painless test that uses sound waves to create images of your heart. It provides your doctor with information about the size and shape of your heart and how well your heart's chambers and valves are working. This procedure takes approximately one hour. There are no restrictions for this procedure.   Follow-Up: 3 months with MD  Any Other Special Instructions Will Be Listed Below (If Applicable).    If you need a refill on your cardiac medications before your next appointment, please call your pharmacy.

## 2021-10-27 LAB — T3, FREE: T3, Free: 3.1 pg/mL (ref 2.0–4.4)

## 2021-10-28 ENCOUNTER — Other Ambulatory Visit (INDEPENDENT_AMBULATORY_CARE_PROVIDER_SITE_OTHER): Payer: Self-pay | Admitting: Nurse Practitioner

## 2021-10-28 DIAGNOSIS — E119 Type 2 diabetes mellitus without complications: Secondary | ICD-10-CM

## 2021-10-28 DIAGNOSIS — E785 Hyperlipidemia, unspecified: Secondary | ICD-10-CM

## 2021-10-28 DIAGNOSIS — I5032 Chronic diastolic (congestive) heart failure: Secondary | ICD-10-CM

## 2021-10-28 DIAGNOSIS — J4540 Moderate persistent asthma, uncomplicated: Secondary | ICD-10-CM

## 2021-10-28 DIAGNOSIS — E559 Vitamin D deficiency, unspecified: Secondary | ICD-10-CM

## 2021-10-28 DIAGNOSIS — I10 Essential (primary) hypertension: Secondary | ICD-10-CM

## 2021-11-01 DIAGNOSIS — Z20828 Contact with and (suspected) exposure to other viral communicable diseases: Secondary | ICD-10-CM | POA: Diagnosis not present

## 2021-11-04 ENCOUNTER — Other Ambulatory Visit: Payer: Self-pay | Admitting: Obstetrics & Gynecology

## 2021-11-04 ENCOUNTER — Other Ambulatory Visit (INDEPENDENT_AMBULATORY_CARE_PROVIDER_SITE_OTHER): Payer: Self-pay | Admitting: Nurse Practitioner

## 2021-11-04 DIAGNOSIS — I5032 Chronic diastolic (congestive) heart failure: Secondary | ICD-10-CM

## 2021-11-04 DIAGNOSIS — I10 Essential (primary) hypertension: Secondary | ICD-10-CM

## 2021-11-04 DIAGNOSIS — E559 Vitamin D deficiency, unspecified: Secondary | ICD-10-CM

## 2021-11-04 DIAGNOSIS — E785 Hyperlipidemia, unspecified: Secondary | ICD-10-CM

## 2021-11-04 DIAGNOSIS — E119 Type 2 diabetes mellitus without complications: Secondary | ICD-10-CM

## 2021-11-04 DIAGNOSIS — J4540 Moderate persistent asthma, uncomplicated: Secondary | ICD-10-CM

## 2021-11-06 ENCOUNTER — Other Ambulatory Visit: Payer: Self-pay

## 2021-11-06 ENCOUNTER — Other Ambulatory Visit: Payer: Self-pay | Admitting: *Deleted

## 2021-11-06 ENCOUNTER — Ambulatory Visit (INDEPENDENT_AMBULATORY_CARE_PROVIDER_SITE_OTHER): Payer: Medicare Other | Admitting: Pharmacist

## 2021-11-06 DIAGNOSIS — E785 Hyperlipidemia, unspecified: Secondary | ICD-10-CM

## 2021-11-06 DIAGNOSIS — Z79899 Other long term (current) drug therapy: Secondary | ICD-10-CM

## 2021-11-06 DIAGNOSIS — J4540 Moderate persistent asthma, uncomplicated: Secondary | ICD-10-CM

## 2021-11-06 DIAGNOSIS — E119 Type 2 diabetes mellitus without complications: Secondary | ICD-10-CM

## 2021-11-06 DIAGNOSIS — I5032 Chronic diastolic (congestive) heart failure: Secondary | ICD-10-CM

## 2021-11-06 DIAGNOSIS — G459 Transient cerebral ischemic attack, unspecified: Secondary | ICD-10-CM

## 2021-11-06 DIAGNOSIS — I10 Essential (primary) hypertension: Secondary | ICD-10-CM

## 2021-11-06 DIAGNOSIS — E559 Vitamin D deficiency, unspecified: Secondary | ICD-10-CM

## 2021-11-06 LAB — POCT GLYCOSYLATED HEMOGLOBIN (HGB A1C): HbA1c, POC (controlled diabetic range): 6.3 % (ref 0.0–7.0)

## 2021-11-06 MED ORDER — POTASSIUM CHLORIDE CRYS ER 20 MEQ PO TBCR: 20.0000 meq | EXTENDED_RELEASE_TABLET | ORAL | 3 refills | Status: DC | PRN

## 2021-11-06 MED ORDER — TORSEMIDE 20 MG PO TABS: 20.0000 mg | ORAL_TABLET | ORAL | 3 refills | Status: DC | PRN

## 2021-11-06 NOTE — Patient Instructions (Addendum)
Erin Sexton,  It was great to talk to you today!  Please call me with any questions or concerns.   Visit Information  Following are the goals we discussed today:  Patient Goals/Self-Care Activities Over the next 90  days, patient will:  Take medications as prescribed Check blood sugar once a day at the following times: fasting (at least 8 hours since last food consumption) and whenever patient experiences symptoms of hypo/hyperglycemia, document, and provide at future appointments Weigh daily, and contact provider if weight gain of more than 3 lbs in 1 day or more than 5 lbs in 1 week Target a minimum of 150 minutes of moderate intensity exercise weekly  Plan: face to face follow-up appointment scheduled with member of care team on 02/05/22  Kennon Holter, PharmD, [place] Clinical Pharmacist [facility] [phone]   Please call the care guide team at [phone] if you need to cancel or reschedule your appointment.   Patient verbalizes understanding of instructions provided today and agrees to view in [place].

## 2021-11-06 NOTE — Chronic Care Management (AMB) (Signed)
Chronic Care Management Pharmacy Note  11/06/2021 Name:  Erin Sexton MRN:  [id number] DOB:  Apr 20, 1971  Summary:  Type 2 Diabetes Current medications: metformin 1,000 mg by mouth twice daily with meals, dulaglutide (Trulicity) 1.5 mg subcutaneously weekly, and empagliflozin (Jardiance) 10 mg by mouth once daily Current glucose readings: fasting blood glucose improved to 117-130s mostly which is within/near goal of 80-130 per ADA guidelines Discussed with PCP and ordered updated A1c since it had been 3 months since last A1c. A1c now controlled. Down from 8.3 to 6.3 ; Most recent A1c at goal of <7% per ADA guidelines Continue current medications as above. May consider increasing Trulicity at next visit for more weight loss.   Hyperlipidemia/History of TIA (2017): May need to consider addition of omega-3 fatty acids   Overweight/Obesity Continue Trulicity and Jardiance. Plan to continue titration of Trulicity to increase weight loss  Heart failure with preserved ejection fraction (LVEF >50% with evidence of spontaneous or provokable increased LV filling pressures) & Hypertension: Cardiology recently recommended decrease in diuretic regimen. Reviewed with patient today to make sure she understood. Patient verbalized understanding. Most recent echocardiogram was in 2017 but has another one upcoming Consider adding spironolactone and discontinuing potassium supplementation  Chronic Kidney Disease Stage 3a - GFR 45-59 (Mild to Moderately Reduced Function): May need to dose reduce metformin if GFR declines further.  Consider addition of spironolactone  Subjective: Erin Sexton is an 50 y.o. year old female who is a primary patient of [name], NP.  The CCM team was consulted for assistance with disease management and care coordination needs.    Engaged with patient face to face for follow up visit in response to provider referral for pharmacy case management and/or care  coordination services.   Consent to Services:  The patient was given information about Chronic Care Management services, agreed to services, and gave verbal consent prior to initiation of services.  Please see initial visit note for detailed documentation.   Patient Care Team: [name], NP as PCP - General (Nurse Practitioner) Herminio Commons, MD (Inactive) as PCP - Cardiology (Cardiology) Beryle Lathe, [facility] (Pharmacist)  Objective:  Lab Results  Component Value Date   CREATININE 1.42 (H) 10/26/2021   CREATININE 1.21 (H) 08/01/2021   CREATININE 1.28 (H) 04/03/2021    Lab Results  Component Value Date   HGBA1C 6.3 11/06/2021   Last diabetic Eye exam:  Lab Results  Component Value Date/Time   HMDIABEYEEXA No Retinopathy 11/29/2020 12:00 AM    Last diabetic Foot exam: No results found for: HMDIABFOOTEX      Component Value Date/Time   CHOL 101 08/01/2021 0933   TRIG 233 (H) 08/01/2021 0933   HDL 33 (L) 08/01/2021 0933   CHOLHDL 2.7 06/27/2020 1355   VLDL 18 06/21/2016 0510   LDLCALC 32 08/01/2021 0933   LDLCALC 21 06/27/2020 1355    Hepatic Function Latest Ref Rng & Units 08/01/2021 01/01/2021 09/27/2020  Total Protein 6.0 - 8.5 g/dL 6.5 6.5 6.9  Albumin 3.8 - 4.9 g/dL 4.3 - -  AST 0 - 40 IU/L _0 ALT 0 - 32 IU/L _1 Alk Phosphatase 44 - 121 IU/L 124(H) - -  Total Bilirubin 0.0 - 1.2 mg/dL 0.6 0.7 0.9  Bilirubin, Direct 0.0 - 0.3 mg/dL - - -    Lab Results  Component Value Date/Time   TSH 3.175 10/26/2021 12:17 PM   TSH 0.981 08/01/2021 09:33  AM   TSH 1.37 09/27/2020 02:17 PM   FREET4 1.76 (H) 10/26/2021 12:17 PM   FREET4 1.84 (H) 08/01/2021 09:33 AM    CBC Latest Ref Rng & Units 08/01/2021 01/31/2019 02/22/2017  WBC 3.4 - 10.8 x10E3/uL 11.6(H) 11.5(H) 6.5  Hemoglobin 11.1 - 15.9 g/dL 16.5(H) 14.4 13.6  Hematocrit 34.0 - 46.6 % 46.9(H) 43.4 39.0  Platelets 150 - 450 x10E3/uL 277 236 220    Lab Results  Component Value  Date/Time   VD25OH 95.7 08/01/2021 09:33 AM   VD25OH 79 08/10/2020 02:57 PM    Clinical ASCVD: Yes  The ASCVD Risk score (Arnett DK, et al., 2019) failed to calculate for the following reasons:   The valid total cholesterol range is 130 to 320 mg/dL    Social History   Tobacco Use  Smoking Status Never  Smokeless Tobacco Never   BP Readings from Last 3 Encounters:  10/26/21 130/80  08/27/21 127/78  08/08/21 127/70   Pulse Readings from Last 3 Encounters:  10/26/21 62  08/27/21 75  08/08/21 70   Wt Readings from Last 3 Encounters:  10/26/21 297 lb (134.7 kg)  08/27/21 (!) 304 lb (137.9 kg)  08/08/21 (!) 313 lb (142 kg)    Assessment: Review of patient past medical history, allergies, medications, health status, including review of consultants reports, laboratory and other test data, was performed as part of comprehensive evaluation and provision of chronic care management services.   SDOH:  (Social Determinants of Health) assessments and interventions performed:    CCM Care Plan  No Known Allergies  Medications Reviewed Today     Reviewed by Beryle Lathe, [facility] (Pharmacist) on 11/06/21 at 0959  Med List Status: <None>   Medication Order Taking? Sig Documenting Provider Last Dose Status Informant  Accu-Chek Softclix Lancets lancets [id number]  USE 1 LANCET TO CHECK GLUCOSE ONCE DAILY [name], NP  Active   albuterol (PROVENTIL) (5 MG/ML) 0.5% nebulizer solution [id number] Yes Take 0.5 mLs (2.5 mg total) by nebulization every 6 (six) hours as needed for wheezing or shortness of breath. Ailene Ards, NP Taking Active            Med Note Langston Masker, Oretha Milch Aug 27, 2021 10:28 AM) PRN  albuterol (VENTOLIN HFA) 108 (90 Base) MCG/ACT inhaler [id number] Yes Inhale 2 puffs into the lungs as needed (wheezing).  [provider] Taking Active Self           Med Note Vernell Barrier Aug 08, 2021  8:57 AM) Uses daily  atorvastatin  (LIPITOR) 80 MG tablet [id number] Yes Take 1 tablet (80 mg total) by mouth daily. Ailene Ards, NP Taking Active   blood glucose meter kit and supplies KIT [id number]  Dispense based on patient and insurance preference. Use once a day. Diagnosis code:E11.9 Doree Albee, MD  Active   buPROPion (WELLBUTRIN XL) 300 MG 24 hr tablet [id number] Yes Take 1 tablet (300 mg total) by mouth daily. Ailene Ards, NP Taking Active   cetirizine [facility] ALLERGY) 10 MG tablet [id number] Yes Take 1 tablet (10 mg total) by mouth daily. Hall-Potvin, [place], PA-C Taking Active   Chlorphen-PE-Acetaminophen (NOREL AD) 4-10-325 MG TABS [id number] No Take 1 tablet by mouth every 4 (four) hours as needed (nasal congestion, cold symptoms).  Patient not taking: Reported on 08/08/2021   [name], NP Not Taking Active   Cholecalciferol (VITAMIN D3 PO) [id number] Yes Take 10,000 Units  by mouth daily. [provider] Taking Active   clindamycin (CLEOCIN T) 1 % lotion [id number] No   Patient not taking: Reported on 10/26/2021   [provider] Not Taking Active            Med Note (PERDUE, MARY JANE K   Mon Aug 27, 2021 10:29 AM) PRN  clotrimazole-betamethasone (LOTRISONE) cream [id number] Yes APPLY CREAM TOPICALLY TWICE DAILY Gray, Joseph M, NP Taking Active   Dulaglutide (TRULICITY) 1.5 MG/0.5ML SOPN [id number] Yes Inject 1.5 mg into the skin once a week. Gray, Joseph M, NP Taking Active   estradiol (ESTRACE) 2 MG tablet [id number] Yes Take 1 tablet (2 mg total) by mouth at bedtime. Eure, Luther H, MD Taking Active   glucose blood (ACCU-CHEK GUIDE) test strip [id number]  USE 1 STRIP TO CHECK GLUCOSE ONCE DAILY Gray, Joseph M, NP  Active   JARDIANCE 10 MG TABS tablet [id number] Yes Take 1 tablet (10 mg total) by mouth daily. Gray, Sarah E, NP Taking Active   Lancet Device MISC [id number]  Use to check your blood sugar daily Gray, Sarah E, NP  Active   levonorgestrel (MIRENA) 20 MCG/24HR IUD [id number] Yes 1  each by Intrauterine route once. [provider] Taking Active   levothyroxine (SYNTHROID) 150 MCG tablet [id number] Yes Take 1 tablet (150 mcg total) by mouth daily. Gray, Joseph M, NP Taking Active   lisinopril (ZESTRIL) 2.5 MG tablet [id number] Yes Take 1 tablet (2.5 mg total) by mouth daily. Gray, Sarah E, NP Taking Active   Magnesium 200 MG TABS [id number] Yes Take 1 tablet by mouth daily. [provider] Taking Active Self  megestrol (MEGACE) 40 MG tablet [id number] Yes Take 1 tablet by mouth once daily Eure, Luther H, MD Taking Active   metFORMIN (GLUCOPHAGE) 500 MG tablet [id number] Yes Take 2 tablets (1,000 mg total) by mouth 2 (two) times daily. Simpson, Margaret E, MD Taking Active   metoprolol tartrate (LOPRESSOR) 50 MG tablet [id number] Yes Take 1 tablet (50 mg total) by mouth 2 (two) times daily. Gray, Sarah E, NP Taking Active   potassium chloride SA (KLOR-CON) 20 MEQ tablet [id number] Yes Take 1 tablet (20 mEq total) by mouth 2 (two) times daily. Simpson, Margaret E, MD Taking Active   SYMBICORT 160-4.5 MCG/ACT inhaler [id number] Yes Inhale 2 puffs into the lungs as needed. Gray, Joseph M, NP Taking Active   torsemide (DEMADEX) 20 MG tablet [id number] Yes Take 1 tablet (20 mg total) by mouth 2 (two) times daily. Gray, Sarah E, NP Taking Active            Med Note (,  J   Tue Nov 06, 2021  9:54 AM) Taking 20 mg daily as needed             Patient Active Problem List   Diagnosis Date Noted   Leg swelling 08/08/2021   Nasal congestion 08/08/2021   Hypothyroidism 08/08/2021   CRI (chronic renal insufficiency), stage 2 (mild) 10/24/2020   Vitamin D deficiency disease 07/25/2020   TIA (transient ischemic attack) 06/20/2016   Chronic diastolic CHF (congestive heart failure) (HCC) 06/20/2016   Complex endometrial hyperplasia without atypia 06/08/2014   Menorrhagia 06/01/2014   Physical deconditioning 05/25/2014   Acute on chronic diastolic congestive  heart failure (HCC) 05/18/2014   Diabetes mellitus type 2, noninsulin dependent (HCC)    History of sleep apnea 03/21/2014   HOCM (hypertrophic obstructive cardiomyopathy) (HCC) 03/06/2014   Morbid obesity (HCC) 03/02/2014   Essential   hypertension, benign 03/02/2014   Asthma, chronic 03/02/2014    Immunization History  Administered Date(s) Administered   Influenza Inj Mdck Quad Pf 09/11/2016, 09/08/2018   Influenza, Quadrivalent, Recombinant, Inj, Pf 09/15/2019   Influenza-Unspecified 09/09/2019, 08/27/2021   Pneumococcal Polysaccharide-23 08/10/2015, 09/27/2020   Td 12/09/2014    Conditions to be addressed/monitored: CHF, HTN, HLD, DMII, and Asthma  Care Plan : Medication Management  Updates made by Beryle Lathe, [place] since 11/06/2021 12:00 AM     Problem: HLD/TIA, CHF, HTN, T2DM, Asthma   Priority: High  Onset Date: 08/08/2021     Long-Range Goal: Disease Progression Prevention   Start Date: 08/08/2021  Expected End Date: 11/06/2021  Recent Progress: On track  Priority: High  Note:   Current Barriers:  Suboptimal therapeutic regimen for heart failure, weight management, and chronic kidney disease   Pharmacist Clinical Goal(s):  Over the next 90  days, patient will Adhere to plan to optimize therapeutic regimen for heart failure, weight management, and chronic kidney disease as evidenced by report of adherence to recommended medication management changes through collaboration with PharmD and provider.   Interventions: 1:1 collaboration with [name], NP regarding development and update of comprehensive plan of care as evidenced by provider attestation and co-signature Inter-disciplinary care team collaboration (see longitudinal plan of care) Comprehensive medication review performed; medication list updated in electronic medical record  Type 2 Diabetes Current medications: metformin 1,000 mg by mouth twice daily with meals, dulaglutide (Trulicity) 1.5 mg  subcutaneously weekly, and empagliflozin (Jardiance) 10 mg by mouth once daily Patient reports nausea has gone away and has noticed decreased cravings/appetite Intolerances: none Taking medications as directed: yes Side effects thought to be attributed to current medication regimen: no Denies hypoglycemic/hyperglycemic symptoms Hypoglycemia prevention: not indicated at this time Current meal patterns: breakfast: does intermittent fasting and eats mostly baked/grilled chicken and vegetables; drinks: water Current exercise:  walks every day at least 1 mile On a statin: [x] Yes  [] No    Last microalbumin: 12 (06/27/20); on an ACEi/ARB: [x] Yes  [] No    Last eye exam: goes every December per patient  Last foot exam: completed within last year by Dr. Anastasio Champion Pneumonia vaccine: up to date Current glucose readings: fasting blood glucose improved to 117-130s mostly which is within/near goal of 80-130 per ADA guidelines A1c now controlled. Down from 8.3 to 6.3 ; Most recent A1c at goal of <7% per ADA guidelines Continue current medications as above. May consider increasing Trulicity at next visit for more weight loss.  Encouraged regular aerobic exercise with a goal of 30 minutes five times per week (150 minutes per week) Patient was previously instructed on appropriate self administration technique for injection of dulaglutide (Trulicity)   Hyperlipidemia/History of TIA (2017): Current medications: atorvastatin 80 mg by mouth once daily Intolerances: none Taking medications as directed: yes Side effects thought to be attributed to current medication regimen: no Controlled; LDL at goal of <70 due to very high risk given established clinical ASCVD (TIA in 2017) per 2020 AACE/ACE guidelines and TG above goal of <150 per 2020 AACE/ACE guidelines Continue atorvastatin 80 mg by mouth once daily May need to consider addition of omega-3 fatty acids  Recommend regular aerobic exercise Discussed need for  and importance of continued work on weight loss Discussed need for medication compliance Patient plans to discuss whether or not she needs low dose aspirin for secondary prevention  Overweight/Obesity Unable to achieve goal weight loss through lifestyle modification  alone Referral placed by PCP for plastic surgery to consider removal of her pannis Current medications that may improve weight loss: dulaglutide (Trulicity) 1.5 mg subcutaneously weekly and Jardiance 10 mg by mouth daily  Medications/Strategies previously tried:  none ; Fasting/Modified Fasting Baseline weight: 361 lbs; most recent weight: 297 lbs Extensive dietary counseling including education on focus on lean proteins, fruits and vegetables, whole grains and increased fiber consumption, adequate hydration Recommend diet modification to induce energy deficit of 500 kcal/day or greater Encouraged regular aerobic exercise with a goal of 30 minutes five times per week (150 minutes per week) Continue Trulicity and Jardiance. Plan to continue titration of Trulicity to increase weight loss  Heart failure with preserved ejection fraction (LVEF >50% with evidence of spontaneous or provokable increased LV filling pressures) & Hypertension: Suboptimally managed Current treatment: lisinopril 2.5 mg by mouth once daily, metoprolol tartrate 50 mg by mouth twice daily, empagliflozin (Jardiance) 10 mg by mouth once daily, and torsemide 20 mg daily as needed Cardiology recently recommended decrease in diuretic regimen. Reviewed with patient today to make sure she understood. Patient verbalized understanding. Stage C (Symptomatic heart failure)/NYHA Class II (Slight limitation of physical activity. Comfortable at rest. Ordinary physical activity results in fatigue, palpitation, or dyspnea) Most recent echocardiogram was in 2017 but has another one upcoming Current home blood pressure: does not have a machine to check Current home weights: checks  daily (297 lbs at home this AM per patient) Lower extremity edema: [] Yes (minimal)  [x]  No  []  Undetermined Jugular venous distention: [] Yes  []  No  [x]  Undetermined due to body habitus Ascites: [] Yes  [x]  No  []  Undetermined Denies dyspnea at rest or on exertion and reduced exercise capacity . Reports orthopnea (sleeps in recliner) Continue lisinopril 2.5 mg by mouth once daily, metoprolol tartrate 50 mg by mouth twice daily, empagliflozin (Jardiance) 10 mg by mouth once daily, and torsemide 20 mg daily as needed Consider adding spironolactone and discontinuing potassium supplementation Encourage dietary sodium restriction (<3 g/day) Educated on the importance of weighing daily. Patient aware to contact cardiology/primary care team if weight gain >3 lbs in 1 day or >5 lbs in 1 week  Asthma: Controlled Current treatment: albuterol metered dose (ProAir, Ventolin, Proventil) 1 puff by mouth as needed for shortness of breath and budesonide/formoterol (Symbicort HFA) 160-4.5 mcg 2 puffs by mouth once daily Continue albuterol metered dose (ProAir, Ventolin, Proventil) 1 puff by mouth as needed for shortness of breath and budesonide/formoterol (Symbicort HFA) 160-4.5 mcg 2 puffs by mouth once daily Recommend allergen avoidance (environmental control) Encouraged regular physical activity for general health benefits Reminded patient to rinse mouth with water and spit after using ICS containing inhaler to prevent thrush  Chronic Kidney Disease Stage 3a - GFR 45-59 (Mild to Moderately Reduced Function): Current medications: lisinopril 2.5 mg by mouth once daily, empagliflozin (Jardiance) 10 mg by mouth once daily, metformin 1,000 mg by mouth twice daily, dulaglutide (Trulicity) 1.5 mg subcutaneously weekly, and atorvastatin 80 mg by mouth once daily Intolerances: none Taking medications as directed: yes Side effects thought to be attributed to current medication regimen: no Denies dizziness,  lightheadedness, blurred vision, and headache Most recent GFR: 45 mL/min Most recent microalbumin: 12 (06/27/20) Continue medications as above. May need to dose reduce metformin if GFR declines further.  Consider addition of spironolactone Encouraged healthy diet including more fruits and vegetables Discussed need for and importance of continued work on weight loss   Discussed need for improved control of blood sugar  Patient Goals/Self-Care Activities Over the next 90  days, patient will:  Take medications as prescribed Check blood sugar once a day at the following times: fasting (at least 8 hours since last food consumption) and whenever patient experiences symptoms of hypo/hyperglycemia, document, and provide at future appointments Weigh daily, and contact provider if weight gain of more than 3 lbs in 1 day or more than 5 lbs in 1 week Target a minimum of 150 minutes of moderate intensity exercise weekly  Follow Up Plan: Face to Face appointment with care management team member scheduled for: 02/05/22        Medication Assistance: None required.  Patient affirms current coverage meets needs.  Patient's preferred pharmacy is:  [place], [place] - [address] [place] [address] Phone: [phone] Fax: [facility] [place], [place] [address] AT [place]. [address] [place] [address] Phone: [phone] Fax: [phone]  Follow Up:  Patient agrees to Care Plan and Follow-up.  Plan: Face to Face appointment with care management team member scheduled for: 02/05/22  Kennon Holter, PharmD, [place] Pharmacist [facility] [phone]

## 2021-11-07 DIAGNOSIS — J4540 Moderate persistent asthma, uncomplicated: Secondary | ICD-10-CM

## 2021-11-07 DIAGNOSIS — I110 Hypertensive heart disease with heart failure: Secondary | ICD-10-CM

## 2021-11-07 DIAGNOSIS — E785 Hyperlipidemia, unspecified: Secondary | ICD-10-CM | POA: Diagnosis not present

## 2021-11-07 DIAGNOSIS — G459 Transient cerebral ischemic attack, unspecified: Secondary | ICD-10-CM | POA: Diagnosis not present

## 2021-11-07 DIAGNOSIS — E119 Type 2 diabetes mellitus without complications: Secondary | ICD-10-CM | POA: Diagnosis not present

## 2021-11-08 ENCOUNTER — Other Ambulatory Visit: Payer: Self-pay | Admitting: *Deleted

## 2021-11-08 DIAGNOSIS — E785 Hyperlipidemia, unspecified: Secondary | ICD-10-CM

## 2021-11-08 DIAGNOSIS — E559 Vitamin D deficiency, unspecified: Secondary | ICD-10-CM

## 2021-11-08 DIAGNOSIS — I5032 Chronic diastolic (congestive) heart failure: Secondary | ICD-10-CM

## 2021-11-08 DIAGNOSIS — J4540 Moderate persistent asthma, uncomplicated: Secondary | ICD-10-CM

## 2021-11-08 DIAGNOSIS — E119 Type 2 diabetes mellitus without complications: Secondary | ICD-10-CM

## 2021-11-08 DIAGNOSIS — I10 Essential (primary) hypertension: Secondary | ICD-10-CM

## 2021-11-08 MED ORDER — BUPROPION HCL ER (XL) 300 MG PO TB24: 300.0000 mg | ORAL_TABLET | Freq: Every day | ORAL | 0 refills | Status: DC

## 2021-11-08 MED ORDER — ATORVASTATIN CALCIUM 80 MG PO TABS: 80.0000 mg | ORAL_TABLET | Freq: Every day | ORAL | 0 refills | Status: DC

## 2021-11-14 ENCOUNTER — Other Ambulatory Visit: Payer: Self-pay | Admitting: *Deleted

## 2021-11-14 DIAGNOSIS — I5032 Chronic diastolic (congestive) heart failure: Secondary | ICD-10-CM

## 2021-11-14 DIAGNOSIS — Z79899 Other long term (current) drug therapy: Secondary | ICD-10-CM

## 2021-11-14 DIAGNOSIS — I10 Essential (primary) hypertension: Secondary | ICD-10-CM

## 2021-11-15 ENCOUNTER — Telehealth: Payer: Self-pay | Admitting: Cardiology

## 2021-11-15 ENCOUNTER — Encounter: Payer: Self-pay | Admitting: Cardiology

## 2021-11-15 ENCOUNTER — Telehealth: Payer: Self-pay

## 2021-11-15 NOTE — Telephone Encounter (Signed)
This was already addressed by preop team. Disregard. No need for MD input. Thx.

## 2021-11-15 NOTE — Telephone Encounter (Signed)
Please call dental office and ensure this is the clearance we acted on earlier today. Likely duplicate so will remove from our preop box. If not, please route back to P CV DIV PREOP. If duplicate, disregard.

## 2021-11-15 NOTE — Telephone Encounter (Signed)
   [facility] Pre-operative Risk Assessment    Patient Name: Erin Sexton  DOB: [birth year] MRN: [id number]  HEARTCARE STAFF:  - IMPORTANT!!!!!! Under Visit Info/Reason for Call, type in Other and utilize the format Clearance MM/DD/YY or Clearance TBD. Do not use dashes or single digits. - Please review there is not already an duplicate clearance open for this procedure. - If request is for dental extraction, please clarify the # of teeth to be extracted. - If the patient is currently at the dentist's office, call Pre-Op Callback Staff (MA/nurse) to input urgent request.  - If the patient is not currently in the dentist office, please route to the Pre-Op pool.  Request for surgical clearance:  What type of surgery is being performed? Extraction of tooth #5  When is this surgery scheduled? TBD  What type of clearance is required (medical clearance vs. Pharmacy clearance to hold med vs. Both)? Medical   Are there any medications that need to be held prior to surgery and how long? None listed   Practice name and name of physician performing surgery? [facility]   What is the office phone number? [phone]   7.   What is the office fax number? [phone]  8.   Anesthesia type (None, local, MAC, general) ? Local    Jacqulynn Cadet 11/15/2021, 4:37 PM  _________________________________________________________________   (provider comments below)

## 2021-11-15 NOTE — Telephone Encounter (Signed)
Clearance note re-faxed. Believe duplicate as all information including ph and fax # are the same. Looks the duplicate is just showing that tooth # 5 is being extracted.

## 2021-11-15 NOTE — Telephone Encounter (Addendum)
   Patient Name: Erin Sexton  DOB: Jul 09, 1971 MRN: [id number]  Primary Cardiologist: Kate Sable, MD (Inactive)  Chart reviewed as part of pre-operative protocol coverage. I called dental office back and they confirm this is a singular dental extraction under local anesthesia. Simple dental extractions (I.e. 1-2 teeth) are considered low risk procedures per guidelines and generally do not require any specific cardiac clearance. It is also generally accepted that for simple extractions and dental cleanings, there is no need to interrupt blood thinner therapy. (She is not on any blood thinners to hold.)  SBE prophylaxis is not required for the patient from a cardiac standpoint per review of cardiology notes. Dental office called to ensure given h/o murmur - per history she is followed for hypertrophic cardiomyopathy but does not have a history of valvular repair.  I gave this recommendation verbally to dental office and they verbalized understanding and gratitude. I will route this recommendation to the requesting party via Epic fax function and remove from pre-op pool.  Please call with questions.   Charlie Pitter, PA-C 11/15/2021, 1:42 PM

## 2021-11-15 NOTE — Telephone Encounter (Signed)
   [facility] Pre-operative Risk Assessment    Request for surgical clearance:  What type of surgery is being performed? Tooth extraction  When is this surgery scheduled? now   What type of clearance is required (medical clearance vs. Pharmacy clearance to hold med vs. Both)? medical  Are there any medications that need to be held prior to surgery and how long? TBD   Practice name and name of physician performing surgery? Dr Jilda Roche   What is your office phone number [phone]    7.   What is your office fax number [phone]  8.   Anesthesia type (None, local, MAC, general) ? local   Erin Sexton 11/15/2021, 1:00 PM  _________________________________________________________________    Pre opp couldn't respond

## 2021-11-20 ENCOUNTER — Other Ambulatory Visit: Payer: Self-pay

## 2021-11-20 ENCOUNTER — Encounter: Payer: Self-pay | Admitting: Nurse Practitioner

## 2021-11-20 ENCOUNTER — Ambulatory Visit (INDEPENDENT_AMBULATORY_CARE_PROVIDER_SITE_OTHER): Payer: Medicare Other | Admitting: Nurse Practitioner

## 2021-11-20 DIAGNOSIS — E039 Hypothyroidism, unspecified: Secondary | ICD-10-CM

## 2021-11-20 DIAGNOSIS — I10 Essential (primary) hypertension: Secondary | ICD-10-CM

## 2021-11-20 DIAGNOSIS — E119 Type 2 diabetes mellitus without complications: Secondary | ICD-10-CM

## 2021-11-20 DIAGNOSIS — M7989 Other specified soft tissue disorders: Secondary | ICD-10-CM

## 2021-11-20 NOTE — Assessment & Plan Note (Signed)
-  non-pitting today, but pt states that she notices swelling sometimes -cardiology recently cut back on her torsemide; encouraged her to reach out to cardiology since they are managing this

## 2021-11-20 NOTE — Patient Instructions (Signed)
Please have fasting labs drawn 2-3 days prior to your appointment so we can discuss the results during your office visit.  I will be moving to [facility] located at [address] [place], [place] [address] effective Dec 09, 2021. If you would like to establish care with [facility] please call [phone].

## 2021-11-20 NOTE — Progress Notes (Signed)
Acute Office Visit  Subjective:    Patient ID: Erin Sexton, female    DOB: 09/09/1971, 50 y.o.   MRN: [id number]  Chief Complaint  Patient presents with   Annual Exam    CPE    HPI Patient is in today for physical exam. No acute concerns.  Past Medical History:  Diagnosis Date   Abnormal CT scan    a. 02/2014: evaluated for dyspnea and initially placed on Xarelto for PE because radiologist could not exclude small peripheral pulmonary emboli because of limited contrast. However, after admission for ++vaginal bleeding several days later, it was felt she did NOT have PE - underwent further testing with neg VQ 03/06/14, neg LE duplex.   Anemia    Anxiety    Arthritis    Asthma    Blood transfusion without reported diagnosis    Bone spur of right foot    CHF (congestive heart failure) (HCC)    COPD (chronic obstructive pulmonary disease) ([place])    Depression    Diabetes mellitus without complication ([place])    a. Dx 02/2014.   Heart murmur    History of echocardiogram 2015   normal EF, Grade II diastolic dysfunction   HOCM (hypertrophic obstructive cardiomyopathy) ([place])    a. Dx [address]28.   Hyperlipidemia    Hypertension    Morbid obesity (HCC)    NSVT (nonsustained ventricular tachycardia)    a. Noted on tele during 02/2014 adm for symptomatic anemia.   Stroke [facility])    SVT (supraventricular tachycardia) ([place])    a. Noted on tele during 02/2014 adm for symptomatic anemia.   Thyroid disease    Vaginal bleeding     Past Surgical History:  Procedure Laterality Date   COLONOSCOPY N/A 09/30/2019   Procedure: COLONOSCOPY;  Surgeon: Rogene Houston, MD;  Location: [facility];  Service: Endoscopy;  Laterality: N/A;  [place] N/A 08/07/2016   Procedure: DILATATION & CURETTAGE/HYSTEROSCOPY WITH ATTPEMPTED NOVASURE ABLATION AND INSERTION OF IUD;  Surgeon: Florian Buff, MD;  Location: [facility] ORS;  Service: Gynecology;   Laterality: N/A;    Family History  Problem Relation Age of Onset   Diabetes Mother    Arthritis Mother    Other Mother        WPW   Mental illness Mother        Depression   Asthma Mother    Obesity Mother    Heart disease Father        History of coronary bypass grafting at age 24   Hypertension Father    Mental illness Sister    Bipolar disorder Sister    Heart disease Sister        possibly WPW   Depression Sister    Varicose Veins Sister    Uterine cancer Maternal Grandmother    Mental illness Maternal Grandmother    Arthritis Maternal Grandmother    Hyperlipidemia Maternal Grandmother    Heart disease Maternal Grandfather    CAD Neg Hx        No family history of EARLY CAD    Social History   Socioeconomic History   Marital status: Divorced    Spouse name: Not on file   Number of children: 0   Years of education: Not on file   Highest education level: Not on file  Occupational History   Occupation: Disabled  Tobacco Use   Smoking status: Never   Smokeless tobacco:  Never  Vaping Use   Vaping Use: Never used  Substance and Sexual Activity   Alcohol use: No   Drug use: No   Sexual activity: Not Currently    Birth control/protection: I.U.D., Post-menopausal    Comment: IUD in place to control vaginal bleeding  Other Topics Concern   Not on file  Social History Narrative   Divorced for 3 years,married for 5 years.On disability since 2009 secondary to mental illness.   Social Determinants of Health   Financial Resource Strain: Low Risk    Difficulty of Paying Living Expenses: Not hard at all  Food Insecurity: No Food Insecurity   Worried About Charity fundraiser in the Last Year: Never true   [place] in the Last Year: Never true  Transportation Needs: No Transportation Needs   Lack of Transportation (Medical): No   Lack of Transportation (Non-Medical): No  Physical Activity: Insufficiently Active   Days of Exercise per Week: 7 days    Minutes of Exercise per Session: 20 min  Stress: No Stress Concern Present   Feeling of Stress : Not at all  Social Connections: Socially Isolated   Frequency of Communication with Friends and Family: More than three times a week   Frequency of Social Gatherings with Friends and Family: More than three times a week   Attends Religious Services: Never   Marine scientist or Organizations: No   Attends Music therapist: Never   Marital Status: Divorced  Human resources officer Violence: Not At Risk   Fear of Current or Ex-Partner: No   Emotionally Abused: No   Physically Abused: No   Sexually Abused: No    Outpatient Medications Prior to Visit  Medication Sig Dispense Refill   Accu-Chek Softclix Lancets lancets USE 1 LANCET TO CHECK GLUCOSE ONCE DAILY 100 each 3   albuterol (PROVENTIL) (5 MG/ML) 0.5% nebulizer solution Take 0.5 mLs (2.5 mg total) by nebulization every 6 (six) hours as needed for wheezing or shortness of breath. 20 mL 0   albuterol (VENTOLIN HFA) 108 (90 Base) MCG/ACT inhaler Inhale 2 puffs into the lungs as needed (wheezing).      atorvastatin (LIPITOR) 80 MG tablet Take 1 tablet (80 mg total) by mouth daily. 90 tablet 0   blood glucose meter kit and supplies KIT Dispense based on patient and insurance preference. Use once a day. Diagnosis code:E11.9 1 each 0   buPROPion (WELLBUTRIN XL) 300 MG 24 hr tablet Take 1 tablet (300 mg total) by mouth daily. 90 tablet 0   cetirizine (ZYRTEC ALLERGY) 10 MG tablet Take 1 tablet (10 mg total) by mouth daily. 30 tablet 0   Chlorphen-PE-Acetaminophen (NOREL AD) 4-10-325 MG TABS Take 1 tablet by mouth every 4 (four) hours as needed (nasal congestion, cold symptoms). 20 tablet 1   Cholecalciferol (VITAMIN D3 PO) Take 10,000 Units by mouth daily.     clindamycin (CLEOCIN T) 1 % lotion      clotrimazole-betamethasone (LOTRISONE) cream APPLY CREAM TOPICALLY TWICE DAILY 15 g 0   Dulaglutide (TRULICITY) 1.5 XA/1.2IN SOPN Inject  1.5 mg into the skin once a week. 6 mL 1   estradiol (ESTRACE) 2 MG tablet Take 1 tablet (2 mg total) by mouth at bedtime. 90 tablet 3   glucose blood (ACCU-CHEK GUIDE) test strip USE 1 STRIP TO CHECK GLUCOSE ONCE DAILY 100 each 3   JARDIANCE 10 MG TABS tablet Take 1 tablet (10 mg total) by mouth daily. 90 tablet 0  Lancet Device MISC Use to check your blood sugar daily 100 each 5   levonorgestrel (MIRENA) 20 MCG/24HR IUD 1 each by Intrauterine route once.     levothyroxine (SYNTHROID) 150 MCG tablet Take 1 tablet (150 mcg total) by mouth daily. 90 tablet 3   lisinopril (ZESTRIL) 2.5 MG tablet Take 1 tablet (2.5 mg total) by mouth daily. 90 tablet 0   Magnesium 200 MG TABS Take 1 tablet by mouth as needed.     megestrol (MEGACE) 40 MG tablet Take 1 tablet by mouth once daily 30 tablet 11   metFORMIN (GLUCOPHAGE) 500 MG tablet Take 2 tablets (1,000 mg total) by mouth 2 (two) times daily. 360 tablet 0   metoprolol tartrate (LOPRESSOR) 50 MG tablet Take 1 tablet (50 mg total) by mouth 2 (two) times daily. 180 tablet 0   potassium chloride SA (KLOR-CON M) 20 MEQ tablet Take 1 tablet (20 mEq total) by mouth as needed (with lasix). 30 tablet 3   SYMBICORT 160-4.5 MCG/ACT inhaler Inhale 2 puffs into the lungs as needed. 1 each 2   torsemide (DEMADEX) 20 MG tablet Take 1 tablet (20 mg total) by mouth as needed. 30 tablet 3   No facility-administered medications prior to visit.    No Known Allergies  Review of Systems  Constitutional: Negative.   HENT: Negative.    Eyes: Negative.   Respiratory: Negative.    Cardiovascular: Negative.   Gastrointestinal: Negative.   Endocrine: Negative.   Genitourinary: Negative.   Musculoskeletal: Negative.   Skin: Negative.   Allergic/Immunologic: Negative.   Neurological: Negative.   Hematological: Negative.   Psychiatric/Behavioral: Negative.        Objective:    Physical Exam Constitutional:      Appearance: Normal appearance. She is obese.   HENT:     Head: Normocephalic and atraumatic.     Right Ear: Tympanic membrane, ear canal and external ear normal.     Left Ear: Tympanic membrane, ear canal and external ear normal.     Nose: Nose normal.     Mouth/Throat:     Mouth: Mucous membranes are moist.     Pharynx: Oropharynx is clear.  Eyes:     Extraocular Movements: Extraocular movements intact.     Conjunctiva/sclera: Conjunctivae normal.     Pupils: Pupils are equal, round, and reactive to light.  Cardiovascular:     Rate and Rhythm: Normal rate and regular rhythm.     Pulses: Normal pulses.     Heart sounds: Normal heart sounds.  Pulmonary:     Effort: Pulmonary effort is normal.     Breath sounds: Normal breath sounds.  Abdominal:     General: Abdomen is flat. Bowel sounds are normal.     Palpations: Abdomen is soft.  Musculoskeletal:        General: Normal range of motion.     Cervical back: Normal range of motion and neck supple.  Skin:    General: Skin is warm and dry.     Capillary Refill: Capillary refill takes less than 2 seconds.  Neurological:     General: No focal deficit present.     Mental Status: She is alert and oriented to person, place, and time.     Cranial Nerves: No cranial nerve deficit.     Sensory: No sensory deficit.     Motor: No weakness.     Coordination: Coordination normal.     Gait: Gait normal.  Psychiatric:  Mood and Affect: Mood normal.        Behavior: Behavior normal.        Thought Content: Thought content normal.        Judgment: Judgment normal.    BP 137/81    Pulse 85    Ht 5' 9" (1.753 m)    Wt 295 lb 1.9 oz (133.9 kg)    SpO2 98%    BMI 43.58 kg/m  Wt Readings from Last 3 Encounters:  11/20/21 295 lb 1.9 oz (133.9 kg)  10/26/21 297 lb (134.7 kg)  08/27/21 (!) 304 lb (137.9 kg)    Health Maintenance Due  Topic Date Due   Zoster Vaccines- Shingrix (1 of 2) Never done   Pneumococcal Vaccine 29-70 Years old (3 - PCV) 09/27/2021    There are no  preventive care reminders to display for this patient.   Lab Results  Component Value Date   TSH 3.175 10/26/2021   Lab Results  Component Value Date   WBC 11.6 (H) 08/01/2021   HGB 16.5 (H) 08/01/2021   HCT 46.9 (H) 08/01/2021   MCV 89 08/01/2021   PLT 277 08/01/2021   Lab Results  Component Value Date   NA 140 10/26/2021   K 3.5 10/26/2021   CO2 26 10/26/2021   GLUCOSE 123 (H) 10/26/2021   BUN 20 10/26/2021   CREATININE 1.42 (H) 10/26/2021   BILITOT 0.6 08/01/2021   ALKPHOS 124 (H) 08/01/2021   AST 18 08/01/2021   ALT 19 08/01/2021   PROT 6.5 08/01/2021   ALBUMIN 4.3 08/01/2021   CALCIUM 9.2 10/26/2021   ANIONGAP 11 10/26/2021   EGFR 54 (L) 08/01/2021   Lab Results  Component Value Date   CHOL 101 08/01/2021   Lab Results  Component Value Date   HDL 33 (L) 08/01/2021   Lab Results  Component Value Date   LDLCALC 32 08/01/2021   Lab Results  Component Value Date   TRIG 233 (H) 08/01/2021   Lab Results  Component Value Date   CHOLHDL 2.7 06/27/2020   Lab Results  Component Value Date   HGBA1C 6.3 11/06/2021       Assessment & Plan:   Problem List Items Addressed This Visit       Cardiovascular and Mediastinum   Essential hypertension, benign    BP Readings from Last 3 Encounters:  11/20/21 137/81  10/26/21 130/80  08/27/21 127/78  -well controlled; no med changes      Relevant Orders   CMP14+EGFR   CBC with Differential/Platelet   Lipid Panel With LDL/HDL Ratio     Endocrine   Diabetes mellitus type 2, noninsulin dependent ([place])    Lab Results  Component Value Date   HGBA1C 6.3 11/06/2021  -well controlled -on ACEi and statin      Relevant Orders   CMP14+EGFR   CBC with Differential/Platelet   Lipid Panel With LDL/HDL Ratio   Hemoglobin A1c   Hypothyroidism    Last thyroid functions Lab Results  Component Value Date   TSH 3.175 10/26/2021   T4TOTAL 13.3 (H) 11/15/2019  -TSH is good; but T4 is 1.76, slightly  elevated -no palpitations, insomnia, or anxiety -stable from previous labs, so no changes in meds or referrals today      Relevant Orders   TSH + free T4     Other   Leg swelling    -non-pitting today, but pt states that she notices swelling sometimes -cardiology recently cut back on her torsemide;  encouraged her to reach out to cardiology since they are managing this        No orders of the defined types were placed in this encounter.    [name], NP

## 2021-11-20 NOTE — Assessment & Plan Note (Signed)
BP Readings from Last 3 Encounters:  11/20/21 137/81  10/26/21 130/80  08/27/21 127/78   -well controlled; no med changes

## 2021-11-20 NOTE — Assessment & Plan Note (Signed)
Lab Results  Component Value Date   HGBA1C 6.3 11/06/2021   -well controlled -on ACEi and statin

## 2021-11-20 NOTE — Assessment & Plan Note (Addendum)
Last thyroid functions Lab Results  Component Value Date   TSH 3.175 10/26/2021   T4TOTAL 13.3 (H) 11/15/2019   -TSH is good; but T4 is 1.76, slightly elevated -no palpitations, insomnia, or anxiety -stable from previous labs, so no changes in meds or referrals today

## 2021-12-03 DIAGNOSIS — E119 Type 2 diabetes mellitus without complications: Secondary | ICD-10-CM | POA: Diagnosis not present

## 2021-12-03 LAB — HM DIABETES EYE EXAM

## 2021-12-05 ENCOUNTER — Ambulatory Visit (HOSPITAL_COMMUNITY)
Admission: RE | Admit: 2021-12-05 | Discharge: 2021-12-05 | Disposition: A | Discharge status: Home / Self Care | Payer: Medicare Other | Source: Ambulatory Visit | Attending: Cardiology | Admitting: Cardiology

## 2021-12-05 ENCOUNTER — Other Ambulatory Visit: Payer: Self-pay

## 2021-12-05 DIAGNOSIS — I421 Obstructive hypertrophic cardiomyopathy: Secondary | ICD-10-CM | POA: Insufficient documentation

## 2021-12-05 LAB — ECHOCARDIOGRAM COMPLETE
AV Mean grad: 27 mmHg
AV Peak grad: 49.8 mmHg
Ao pk vel: 3.53 m/s
Area-P 1/2: 2.11 cm2
S' Lateral: 3 cm

## 2021-12-05 MED ORDER — PERFLUTREN LIPID MICROSPHERE
1.0000 mL | INTRAVENOUS | Status: AC | PRN
Start: 2021-12-05 — End: 2021-12-05
  Administered 2021-12-05: 09:00:00 5 mL via INTRAVENOUS
  Filled 2021-12-05: qty 10

## 2021-12-05 NOTE — Progress Notes (Signed)
*  PRELIMINARY RESULTS* Echocardiogram 2D Echocardiogram has been performed with Definity.  Erin Sexton 12/05/2021, 9:46 AM

## 2021-12-06 ENCOUNTER — Other Ambulatory Visit: Payer: Self-pay

## 2021-12-07 ENCOUNTER — Other Ambulatory Visit: Payer: Self-pay

## 2021-12-07 DIAGNOSIS — I5032 Chronic diastolic (congestive) heart failure: Secondary | ICD-10-CM

## 2021-12-07 NOTE — Progress Notes (Signed)
Recommend PO ativan 0.5 mg x 1 before her MRI

## 2021-12-14 ENCOUNTER — Other Ambulatory Visit: Payer: Self-pay

## 2021-12-14 DIAGNOSIS — I421 Obstructive hypertrophic cardiomyopathy: Secondary | ICD-10-CM

## 2021-12-23 NOTE — Progress Notes (Deleted)
Cardiology Office Note:    Date:  12/23/2021   ID:  Erin Sexton, DOB [birth year], MRN [id number]  PCP:  Lindell Spar, MD  Cardiologist:  Kate Sable, MD (Inactive)  Electrophysiologist:  None   Referring MD: [name], NP   No chief complaint on file.    History of Present Illness:    Erin Sexton is a 51 y.o. female with a hx of hypertension, COPD, asthma, non-insulin-dependent diabetes, morbid obesity, chronic diastolic dysfunction, sleep apnea, dyslipidemia, and a past history of HOCM by echocardiogram in 2017 who presents for follow-up.  She denies any chest pain or lower extremity edema.  Does report chronic shortness of breath which she attributes to asthma.  Reports occasional lightheadedness with standing but denies any syncope.  Reports rare palpitations, last for few seconds.  She is taking torsemide.  Walks every day for 30 minutes.  Echocardiogram 06/21/2016 showed severe asymmetric LVH, LVOT obstruction with peak gradient 48 mmHg, grade 1 diastolic dysfunction.  Echocardiogram 12/05/2021 showed finding consistent with HOCM, severe asymmetric LVH of basal septal segments, peak LVOT gradient around 36 mmHg with Valsalva, normal biventricular function, mild MR.  Since last clinic visit, CMR Zio Diuretics   Past Medical History:  Diagnosis Date   Abnormal CT scan    a. 02/2014: evaluated for dyspnea and initially placed on Xarelto for PE because radiologist could not exclude small peripheral pulmonary emboli because of limited contrast. However, after admission for ++vaginal bleeding several days later, it was felt she did NOT have PE - underwent further testing with neg VQ 03/06/14, neg LE duplex.   Anemia    Anxiety    Arthritis    Asthma    Blood transfusion without reported diagnosis    Bone spur of right foot    CHF (congestive heart failure) (HCC)    COPD (chronic obstructive pulmonary disease) ([place])    Depression    Diabetes mellitus without  complication ([place])    a. Dx 02/2014.   Heart murmur    History of echocardiogram 2015   normal EF, Grade II diastolic dysfunction   HOCM (hypertrophic obstructive cardiomyopathy) ([place])    a. Dx 54562.   Hyperlipidemia    Hypertension    Morbid obesity (HCC)    NSVT (nonsustained ventricular tachycardia)    a. Noted on tele during 02/2014 adm for symptomatic anemia.   Stroke [facility])    SVT (supraventricular tachycardia) ([place])    a. Noted on tele during 02/2014 adm for symptomatic anemia.   Thyroid disease    Vaginal bleeding     Past Surgical History:  Procedure Laterality Date   COLONOSCOPY N/A 09/30/2019   Procedure: COLONOSCOPY;  Surgeon: Rogene Houston, MD;  Location[facility];  Service: Endoscopy;  Laterality: N/A;  [place] N/A 08/07/2016   Procedure: DILATATION & CURETTAGE/HYSTEROSCOPY WITH ATTPEMPTED NOVASURE ABLATION AND INSERTION OF IUD;  Surgeon: Florian Buff, MD;  Location: [facility] ORS;  Service: Gynecology;  Laterality: N/A;    Current Medications: No outpatient medications have been marked as taking for the 12/26/21 encounter (Appointment) with Donato Heinz, MD.     Allergies:   Patient has no known allergies.   Social History   Socioeconomic History   Marital status: Divorced    Spouse name: Not on file   Number of children: 0   Years of education: Not on file   Highest education level: Not on file  Occupational History   Occupation: Disabled  Tobacco Use   Smoking status: Never   Smokeless tobacco: Never  Vaping Use   Vaping Use: Never used  Substance and Sexual Activity   Alcohol use: No   Drug use: No   Sexual activity: Not Currently    Birth control/protection: I.U.D., Post-menopausal    Comment: IUD in place to control vaginal bleeding  Other Topics Concern   Not on file  Social History Narrative   Divorced for 3 years,married for 5 years.On disability since 2009 secondary to  mental illness.   Social Determinants of Health   Financial Resource Strain: Low Risk    Difficulty of Paying Living Expenses: Not hard at all  Food Insecurity: No Food Insecurity   Worried About Charity fundraiser in the Last Year: Never true   [place] in the Last Year: Never true  Transportation Needs: No Transportation Needs   Lack of Transportation (Medical): No   Lack of Transportation (Non-Medical): No  Physical Activity: Insufficiently Active   Days of Exercise per Week: 7 days   Minutes of Exercise per Session: 20 min  Stress: No Stress Concern Present   Feeling of Stress : Not at all  Social Connections: Socially Isolated   Frequency of Communication with Friends and Family: More than three times a week   Frequency of Social Gatherings with Friends and Family: More than three times a week   Attends Religious Services: Never   Marine scientist or Organizations: No   Attends Music therapist: Never   Marital Status: Divorced     Family History: The patient's family history includes Arthritis in her maternal grandmother and mother; Asthma in her mother; Bipolar disorder in her sister; Depression in her sister; Diabetes in her mother; Heart disease in her father, maternal grandfather, and sister; Hyperlipidemia in her maternal grandmother; Hypertension in her father; Mental illness in her maternal grandmother, mother, and sister; Obesity in her mother; Other in her mother; Uterine cancer in her maternal grandmother; Varicose Veins in her sister. There is no history of CAD.  ROS:   Please see the history of present illness.     All other systems reviewed and are negative.  EKGs/Labs/Other Studies Reviewed:    The following studies were reviewed today:   EKG:  EKG is  ordered today.  The ekg ordered today demonstrates normal sinus rhythm, rate 78, left axis deviation, LVH  Recent Labs: 08/01/2021: ALT 19; Hemoglobin 16.5; Platelets  277 10/26/2021: BUN 20; Creatinine, Ser 1.42; Magnesium 2.2; Potassium 3.5; Sodium 140; TSH 3.175  Recent Lipid Panel    Component Value Date/Time   CHOL 101 08/01/2021 0933   TRIG 233 (H) 08/01/2021 0933   HDL 33 (L) 08/01/2021 0933   CHOLHDL 2.7 06/27/2020 1355   VLDL 18 06/21/2016 0510   LDLCALC 32 08/01/2021 0933   LDLCALC 21 06/27/2020 1355    Physical Exam:    VS:  There were no vitals taken for this visit.    Wt Readings from Last 3 Encounters:  11/20/21 295 lb 1.9 oz (133.9 kg)  10/26/21 297 lb (134.7 kg)  08/27/21 (!) 304 lb (137.9 kg)     GEN:  Well nourished, well developed in no acute distress HEENT: Normal NECK: No JVD; No carotid bruits CARDIAC: RRR, no murmurs, rubs, gallops RESPIRATORY:  Clear to auscultation without rales, wheezing or rhonchi  ABDOMEN: Soft, non-tender, non-distended MUSCULOSKELETAL:  No edema; No deformity  SKIN:  Warm and dry NEUROLOGIC:  Alert and oriented x 3 PSYCHIATRIC:  Normal affect   ASSESSMENT:    No diagnosis found.  PLAN:    Hypertrophic cardiomyopathy: Echocardiogram 12/05/2021 showed finding consistent with HOCM, severe asymmetric LVH of basal septal segments, peak LVOT gradient around 36 mmHg with Valsalva, normal biventricular function, mild MR. -Cardiac MRI -Zio patch x 7 days  Hypertension: On lisinopril 2.5 mg daily and metoprolol 50 mg twice daily.  Appears controlled  Chronic diastolic heart failure: On torsemide 20 mg daily as needed.  Recommend avoiding diuretics given HOCM as above  OSA: not using CPAP regularly.   T2DM: On Jardiance, metformin, Trulicity.  Follows with PCP, A1c 8.3% on 08/02/2019  Hyperlipidemia: On atorvastatin 80 mg daily.  LDL 33 on 08/01/2021  Morbid obesity: There is no height or weight on file to calculate BMI.  Lost 33 lbs over last year.   RTC in ***  Medication Adjustments/Labs and Tests Ordered: Current medicines are reviewed at length with the patient today.  Concerns  regarding medicines are outlined above.  No orders of the defined types were placed in this encounter.  No orders of the defined types were placed in this encounter.    There are no Patient Instructions on file for this visit.   Signed, Donato Heinz, MD  12/23/2021 4:29 PM    [place]

## 2021-12-24 ENCOUNTER — Encounter: Payer: Self-pay | Admitting: Obstetrics & Gynecology

## 2021-12-24 MED ORDER — ESTRADIOL 2 MG PO TABS: 2.0000 mg | ORAL_TABLET | Freq: Every day | ORAL | 3 refills | Status: DC

## 2021-12-26 ENCOUNTER — Ambulatory Visit: Payer: Medicare Other | Admitting: Cardiology

## 2021-12-27 ENCOUNTER — Ambulatory Visit: Payer: Medicare Other | Admitting: Nurse Practitioner

## 2021-12-31 ENCOUNTER — Other Ambulatory Visit (INDEPENDENT_AMBULATORY_CARE_PROVIDER_SITE_OTHER): Payer: Self-pay | Admitting: Nurse Practitioner

## 2021-12-31 DIAGNOSIS — I10 Essential (primary) hypertension: Secondary | ICD-10-CM

## 2021-12-31 DIAGNOSIS — J4540 Moderate persistent asthma, uncomplicated: Secondary | ICD-10-CM

## 2021-12-31 DIAGNOSIS — I5032 Chronic diastolic (congestive) heart failure: Secondary | ICD-10-CM

## 2021-12-31 DIAGNOSIS — E119 Type 2 diabetes mellitus without complications: Secondary | ICD-10-CM

## 2021-12-31 DIAGNOSIS — E785 Hyperlipidemia, unspecified: Secondary | ICD-10-CM

## 2021-12-31 DIAGNOSIS — E559 Vitamin D deficiency, unspecified: Secondary | ICD-10-CM

## 2022-01-02 MED ORDER — METOPROLOL TARTRATE 50 MG PO TABS: 50.0000 mg | ORAL_TABLET | Freq: Two times a day (BID) | ORAL | 0 refills | Status: DC

## 2022-01-02 MED ORDER — LISINOPRIL 2.5 MG PO TABS: 2.5000 mg | ORAL_TABLET | Freq: Every day | ORAL | 0 refills | Status: DC

## 2022-01-17 ENCOUNTER — Other Ambulatory Visit (INDEPENDENT_AMBULATORY_CARE_PROVIDER_SITE_OTHER): Payer: Self-pay | Admitting: Nurse Practitioner

## 2022-01-17 DIAGNOSIS — E119 Type 2 diabetes mellitus without complications: Secondary | ICD-10-CM

## 2022-01-18 DIAGNOSIS — Z20822 Contact with and (suspected) exposure to covid-19: Secondary | ICD-10-CM | POA: Diagnosis not present

## 2022-01-18 MED ORDER — JARDIANCE 10 MG PO TABS: 10.0000 mg | ORAL_TABLET | Freq: Every day | ORAL | 1 refills | Status: DC

## 2022-02-05 ENCOUNTER — Ambulatory Visit (INDEPENDENT_AMBULATORY_CARE_PROVIDER_SITE_OTHER): Payer: Medicare Other | Admitting: Pharmacist

## 2022-02-05 ENCOUNTER — Other Ambulatory Visit: Payer: Self-pay

## 2022-02-05 ENCOUNTER — Ambulatory Visit: Payer: Medicare Other

## 2022-02-05 DIAGNOSIS — J4540 Moderate persistent asthma, uncomplicated: Secondary | ICD-10-CM

## 2022-02-05 DIAGNOSIS — E785 Hyperlipidemia, unspecified: Secondary | ICD-10-CM

## 2022-02-05 DIAGNOSIS — N182 Chronic kidney disease, stage 2 (mild): Secondary | ICD-10-CM

## 2022-02-05 DIAGNOSIS — E119 Type 2 diabetes mellitus without complications: Secondary | ICD-10-CM

## 2022-02-05 DIAGNOSIS — I5032 Chronic diastolic (congestive) heart failure: Secondary | ICD-10-CM

## 2022-02-05 DIAGNOSIS — I10 Essential (primary) hypertension: Secondary | ICD-10-CM

## 2022-02-05 DIAGNOSIS — G459 Transient cerebral ischemic attack, unspecified: Secondary | ICD-10-CM

## 2022-02-05 MED ORDER — TRULICITY 1.5 MG/0.5ML ~~LOC~~ SOAJ: 1.5000 mg | SUBCUTANEOUS | 1 refills | Status: DC

## 2022-02-05 NOTE — Patient Instructions (Signed)
[name],  It was great to talk to you today!  Call [phone] to schedule with [facility]  Please call me with any questions or concerns.  Visit Information  Following are the goals we discussed today:   Goals Addressed             This Visit's Progress    Medication Management       Patient Goals/Self-Care Activities Patient will:  Take medications as prescribed Check blood sugar once a day at the following times: fasting (at least 8 hours since last food consumption) and whenever patient experiences symptoms of hypo/hyperglycemia, document, and provide at future appointments Weigh daily, and contact provider if weight gain of more than 3 lbs in 1 day or more than 5 lbs in 1 week Target a minimum of 150 minutes of moderate intensity exercise weekly           Follow-up plan: Next PCP appointment scheduled for: 02/12/22  Patient verbalizes understanding of instructions and care plan provided today and agrees to view in [place]. Active MyChart status confirmed with patient.    Please call the care guide team at [phone] if you need to cancel or reschedule your appointment.   Kennon Holter, PharmD, [place], CPP Clinical Pharmacist Practitioner [facility] [phone]

## 2022-02-05 NOTE — Chronic Care Management (AMB) (Signed)
Chronic Care Management Pharmacy Note  02/05/2022 Name:  Erin Sexton MRN:  [id number] DOB:  Jul 31, 1971  Summary: Type 2 Diabetes Controlled. Last A1c 6.3% which is at goal of <7% per ADA guidelines Current medications: metformin 1,000 mg by mouth twice daily with meals, dulaglutide (Trulicity) 1.5 mg subcutaneously weekly, and empagliflozin (Jardiance) 10 mg by mouth once daily Current glucose readings: fasting blood glucose remains stable 105-131 per blood glucose log which is within goal of 80-130 per ADA guidelines Continue current medications as above. Discussed increasing Trulicity to 28m per week but with national backorder through April, refill was sent to pharmacy for 2 month supply of Trulicity 1.5 mg per week. Will consider increasing Trulicity at next visit for more weight loss.  Recheck A1c next week prior to primary care provider visit.  Hyperlipidemia/History of TIA (2017): TG above goal of <150 per 2020 AACE/ACE guidelines Continue atorvastatin 80 mg by mouth once daily Rechek fasting lipid panel next week prior to primary care provider visit. May need to consider addition of omega-3 fatty acids if triglycerides remain elevated.   Overweight/Obesity Referral placed by PCP for plastic surgery to consider removal of her pannis Baseline weight: 361 lbs; most recent weight: 294 lbs Continue Trulicity and Jardiance. Plan to continue titration of Trulicity to increase weight loss at future visits.   Subjective: Erin Sexton an 51y.o. year old female who is a primary patient of PLindell Spar MD.  The CCM team was consulted for assistance with disease management and care coordination needs.    Engaged with patient face to face for follow up visit in response to provider referral for pharmacy case management and/or care coordination services.   Consent to Services:  The patient was given information about Chronic Care Management services, agreed to services,  and gave verbal consent prior to initiation of services.  Please see initial visit note for detailed documentation.   Patient Care Team: PLindell Spar MD as PCP - General (Internal Medicine) KHerminio Commons MD (Inactive) as PCP - Cardiology (Cardiology) [facility](Ph[facility]ist)  Objective:  Lab Results  Component Value Date   CREATININE 1.42 (H) 10/26/2021   CREATININE 1.21 (H) 08/01/2021   CREATININE 1.28 (H) 04/03/2021    Lab Results  Component Value Date   HGBA1C 6.3 11/06/2021   Last diabetic Eye exam:  Lab Results  Component Value Date/Time   HMDIABEYEEXA No Retinopathy 11/29/2020 12:00 AM    Last diabetic Foot exam: No results found for: HMDIABFOOTEX      Component Value Date/Time   CHOL 101 08/01/2021 0933   TRIG 233 (H) 08/01/2021 0933   HDL 33 (L) 08/01/2021 0933   CHOLHDL 2.7 06/27/2020 1355   VLDL 18 06/21/2016 0510   LDLCALC 32 08/01/2021 0933   LDLCALC 21 06/27/2020 1355    Hepatic Function Latest Ref Rng & Units 08/01/2021 01/01/2021 09/27/2020  Total Protein 6.0 - 8.5 g/dL 6.5 6.5 6.9  Albumin 3.8 - 4.9 g/dL 4.3 - -  AST 0 - 40 IU/L _0 ALT 0 - 32 IU/L _1 Alk Phosphatase 44 - 121 IU/L 124(H) - -  Total Bilirubin 0.0 - 1.2 mg/dL 0.6 0.7 0.9  Bilirubin, Direct 0.0 - 0.3 mg/dL - - -    Lab Results  Component Value Date/Time   TSH 3.175 10/26/2021 12:17 PM   TSH 0.981 08/01/2021 09:33 AM   TSH 1.37 09/27/2020 02:17 PM  FREET4 1.76 (H) 10/26/2021 12:17 PM   FREET4 1.84 (H) 08/01/2021 09:33 AM    CBC Latest Ref Rng & Units 08/01/2021 01/31/2019 02/22/2017  WBC 3.4 - 10.8 x10E3/uL 11.6(H) 11.5(H) 6.5  Hemoglobin 11.1 - 15.9 g/dL 16.5(H) 14.4 13.6  Hematocrit 34.0 - 46.6 % 46.9(H) 43.4 39.0  Platelets 150 - 450 x10E3/uL 277 236 220    Lab Results  Component Value Date/Time   VD25OH 95.7 08/01/2021 09:33 AM   VD25OH 79 08/10/2020 02:57 PM    Clinical ASCVD: Yes  The ASCVD Risk score (Arnett DK, et al.,  2019) failed to calculate for the following reasons:   The valid total cholesterol range is 130 to 320 mg/dL    Social History   Tobacco Use  Smoking Status Never  Smokeless Tobacco Never   BP Readings from Last 3 Encounters:  12/05/21 (!) 145/78  11/20/21 137/81  10/26/21 130/80   Pulse Readings from Last 3 Encounters:  12/05/21 67  11/20/21 85  10/26/21 62   Wt Readings from Last 3 Encounters:  11/20/21 295 lb 1.9 oz (133.9 kg)  10/26/21 297 lb (134.7 kg)  08/27/21 (!) 304 lb (137.9 kg)    Assessment: Review of patient past medical history, allergies, medications, health status, including review of consultants reports, laboratory and other test data, was performed as part of comprehensive evaluation and provision of chronic care management services.   SDOH:  (Social Determinants of Health) assessments and interventions performed:    CCM Care Plan  No Known Allergies  Medications Reviewed Today     Reviewed by Beryle Lathe, [facility] (Pharmacist) on 02/05/22 at [phone]  Med List Status: <None>   Medication Order Taking? Sig Documenting Provider Last Dose Status Informant  Accu-Chek Softclix Lancets lancets [id number] Yes USE 1 LANCET TO CHECK GLUCOSE ONCE DAILY [name], NP Taking Active   albuterol (PROVENTIL) (5 MG/ML) 0.5% nebulizer solution [id number] Yes Take 0.5 mLs (2.5 mg total) by nebulization every 6 (six) hours as needed for wheezing or shortness of breath. Ailene Ards, NP Taking Active            Med Note Langston Masker, Oretha Milch Aug 27, 2021 10:28 AM) PRN  albuterol (VENTOLIN HFA) 108 (90 Base) MCG/ACT inhaler [id number] Yes Inhale 2 puffs into the lungs as needed (wheezing).  [provider] Taking Active Self           Med Note Vernell Barrier Aug 08, 2021  8:57 AM) Uses daily  atorvastatin (LIPITOR) 80 MG tablet [id number] Yes Take 1 tablet (80 mg total) by mouth daily. [name], NP Taking Active   blood glucose meter  kit and supplies KIT [id number] Yes Dispense based on patient and insurance preference. Use once a day. Diagnosis code:E11.9 Doree Albee, MD Taking Active   buPROPion (WELLBUTRIN XL) 300 MG 24 hr tablet [id number] Yes Take 1 tablet (300 mg total) by mouth daily. [name], NP Taking Active   cetirizine [facility]) 10 MG tablet [id number] Yes Take 1 tablet (10 mg total) by mouth daily. Hall-Potvin, [place], PA-C Taking Active   Cholecalciferol (VITAMIN D3 PO) [id number] Yes Take 10,000 Units by mouth daily. [provider] Taking Active   clindamycin (CLEOCIN T) 1 % lotion [id number] Yes  [provider] Taking Active            Med Note Langston Masker, Oretha Milch Aug 27, 2021 10:29 AM)  PRN  clotrimazole-betamethasone (LOTRISONE) cream [id number] Yes APPLY CREAM TOPICALLY TWICE DAILY [name], NP Taking Active   Dulaglutide (TRULICITY) 1.5 MP/5.3IR SOPN [id number]  Inject 1.5 mg into the skin once a week. Lindell Spar, MD  Active   estradiol (ESTRACE) 2 MG tablet [id number] Yes Take 1 tablet (2 mg total) by mouth at bedtime. Florian Buff, MD Taking Active   glucose blood (ACCU-CHEK GUIDE) test strip [id number] Yes USE 1 STRIP TO CHECK GLUCOSE ONCE DAILY [name], NP Taking Active   JARDIANCE 10 MG TABS tablet [id number] Yes Take 1 tablet (10 mg total) by mouth daily. Lindell Spar, MD Taking Active   Lancet Device MISC [id number]  Use to check your blood sugar daily Ailene Ards, NP  Active   levonorgestrel [facility]) 20 MCG/24HR IUD [id number] Yes 1 each by Intrauterine route once. [provider] Taking Active   levothyroxine (SYNTHROID) 150 MCG tablet [id number] Yes Take 1 tablet (150 mcg total) by mouth daily. [name], NP Taking Active   lisinopril (ZESTRIL) 2.5 MG tablet [id number] Yes Take 1 tablet (2.5 mg total) by mouth daily. Ailene Ards, NP Taking Active   Magnesium 200 MG TABS [id number] Yes Take 1 tablet by mouth as needed. [provider] Taking Active Self  megestrol (MEGACE) 40 MG tablet [id number] Yes Take 1 tablet by mouth once daily Florian Buff, MD Taking Active   metFORMIN (GLUCOPHAGE) 500 MG tablet [id number] Yes Take 2 tablets (1,000 mg total) by mouth 2 (two) times daily. Fayrene Helper, MD Taking Active   metoprolol tartrate (LOPRESSOR) 50 MG tablet [id number] Yes Take 1 tablet (50 mg total) by mouth 2 (two) times daily. Ailene Ards, NP Taking Active   potassium chloride SA (KLOR-CON M) 20 MEQ tablet [id number] Yes Take 1 tablet (20 mEq total) by mouth as needed (with lasix). Donato Heinz, MD Taking Active   SYMBICORT 160-4.5 MCG/ACT inhaler [id number] Yes Inhale 2 puffs into the lungs as needed. [name], NP Taking Active   torsemide (DEMADEX) 20 MG tablet [id number] Yes Take 1 tablet (20 mg total) by mouth as needed. Donato Heinz, MD Taking Active             Patient Active Problem List   Diagnosis Date Noted   Leg swelling 08/08/2021   Nasal congestion 08/08/2021   Hypothyroidism 08/08/2021   CRI (chronic renal insufficiency), stage 2 (mild) 10/24/2020   Vitamin D deficiency disease 07/25/2020   TIA (transient ischemic attack) 06/20/2016   Chronic diastolic CHF (congestive heart failure) ([place]) 06/20/2016   Complex endometrial hyperplasia without atypia 06/08/2014   Menorrhagia 06/01/2014   Physical deconditioning 05/25/2014   Acute on chronic diastolic congestive heart failure ([place]) 05/18/2014   Diabetes mellitus type 2, noninsulin dependent ([place])    History of sleep apnea 03/21/2014   HOCM (hypertrophic obstructive cardiomyopathy) ([place]) 03/06/2014   Morbid obesity ([place]) 03/02/2014   Essential hypertension, benign 03/02/2014   Asthma, chronic 03/02/2014    Immunization History  Administered Date(s) Administered   Influenza Inj Mdck Quad Pf 09/11/2016, 09/08/2018   Influenza, Quadrivalent, Recombinant, Inj, Pf 09/15/2019   Influenza-Unspecified  09/09/2019, 08/27/2021   Pneumococcal Polysaccharide-23 08/10/2015, 09/27/2020   Td 12/09/2014    Conditions to be addressed/monitored: CHF, HTN, HLD, DMII, and Asthma  Care Plan : Medication Management  Updates made by Beryle Lathe, [place] since 02/05/2022 12:00 AM     Problem: HLD/TIA, CHF, HTN, T2DM, Asthma  Priority: High  Onset Date: 08/08/2021     Long-Range Goal: Disease Progression Prevention   Start Date: 08/08/2021  Expected End Date: 11/06/2021  Recent Progress: On track  Priority: High  Note:   Current Barriers:  Unable to achieve control of hyperlipidemia  Pharmacist Clinical Goal(s):  Through collaboration with PharmD and provider, patient will  Achieve control of hyperlipidemia as evidenced by improved triglycerides   Interventions: 1:1 collaboration with Lindell Spar, MD regarding development and update of comprehensive plan of care as evidenced by provider attestation and co-signature Inter-disciplinary care team collaboration (see longitudinal plan of care) Comprehensive medication review performed; medication list updated in electronic medical record  Type 2 Diabetes Controlled. Last A1c 6.3% which is at goal of <7% per ADA guidelines Current medications: metformin 1,000 mg by mouth twice daily with meals, dulaglutide (Trulicity) 1.5 mg subcutaneously weekly, and empagliflozin (Jardiance) 10 mg by mouth once daily Intolerances: none Taking medications as directed: yes Side effects thought to be attributed to current medication regimen: no Denies hypoglycemic/hyperglycemic symptoms Current meal patterns: breakfast: does intermittent fasting and eats mostly baked/grilled chicken and vegetables; drinks: water Current exercise:  walks every day at least 1 mile On a statin: _0  Yes  _1  No    Last microalbumin: 12 (06/27/20); on an ACEi/ARB: _2  Yes  _3  No    Current glucose readings: fasting blood glucose remains stable 105-131 per blood glucose log  which is within goal of 80-130 per ADA guidelines Continue current medications as above. Discussed increasing Trulicity to 25m per week but with national backorder through April, refill was sent to pharmacy for 2 month supply of Trulicity 1.5 mg per week. Will consider increasing Trulicity at next visit for more weight loss.  Encouraged regular aerobic exercise with a goal of 30 minutes five times per week (150 minutes per week) Recheck A1c next week prior to primary care provider visit.  Hyperlipidemia/History of TIA (2017): Uncontrolled; LDL at goal of <70 due to very high risk given established clinical ASCVD (TIA in 2017) per 2020 AACE/ACE guidelines and TG above goal of <150 per 2020 AACE/ACE guidelines Current medications: atorvastatin 80 mg by mouth once daily Intolerances: none Taking medications as directed: yes Side effects thought to be attributed to current medication regimen: no Continue atorvastatin 80 mg by mouth once daily Rechek fasting lipid panel next week prior to primary care provider visit. May need to consider addition of omega-3 fatty acids if triglycerides remain elevated.  Recommend regular aerobic exercise Discussed need for and importance of continued work on weight loss  Overweight/Obesity Unable to achieve goal weight loss through lifestyle modification alone Referral placed by PCP for plastic surgery to consider removal of her pannis Current medications that may improve weight loss: dulaglutide (Trulicity) 1.5 mg subcutaneously weekly and Jardiance 10 mg by mouth daily  Medications/Strategies previously tried: Fasting/Modified Fasting Baseline weight: 361 lbs; most recent weight: 294 lbs Extensive dietary counseling including education on focus on lean proteins, fruits and vegetables, whole grains and increased fiber consumption, adequate hydration Recommend diet modification to induce energy deficit of 500 kcal/day or greater Encouraged regular aerobic exercise  with a goal of 30 minutes five times per week (150 minutes per week) Continue Trulicity and Jardiance. Plan to continue titration of Trulicity to increase weight loss at future visits.   Heart failure with preserved ejection fraction (LVEF >50% with evidence of spontaneous or provokable increased LV filling pressures) & Hypertension: Appropriately managed Current treatment: lisinopril 2.5 mg by mouth  once daily, metoprolol tartrate 50 mg by mouth twice daily, empagliflozin (Jardiance) 10 mg by mouth once daily, and torsemide 20 mg daily as needed Stage C (Symptomatic heart failure)/NYHA Class II (Slight limitation of physical activity. Comfortable at rest. Ordinary physical activity results in fatigue, palpitation, or dyspnea) Current home blood pressure: does not have a machine to check Current home weights: checks daily (294 lbs at home this AM per patient) Lower extremity edema: _0  Yes (minimal)  _1   No  _2   Undetermined Denies dyspnea at rest or on exertion and reduced exercise capacity . Reports orthopnea (sleeps in recliner) Continue lisinopril 2.5 mg by mouth once daily, metoprolol tartrate 50 mg by mouth twice daily, empagliflozin (Jardiance) 10 mg by mouth once daily, and torsemide 20 mg daily as needed Consider adding spironolactone Encourage dietary sodium restriction (<3 g/day) Educated on the importance of weighing daily. Patient aware to contact cardiology/primary care team if weight gain >3 lbs in 1 day or >5 lbs in 1 week  Asthma: Controlled Current treatment: albuterol metered dose (ProAir, Ventolin, Proventil) 1 puff by mouth as needed for shortness of breath and budesonide/formoterol (Symbicort HFA) 160-4.5 mcg 2 puffs by mouth once daily Continue albuterol metered dose (ProAir, Ventolin, Proventil) 1 puff by mouth as needed for shortness of breath and budesonide/formoterol (Symbicort HFA) 160-4.5 mcg 2 puffs by mouth once daily Recommend allergen avoidance (environmental  control) Encouraged regular physical activity for general health benefits Reminded patient to rinse mouth with water and spit after using ICS containing inhaler to prevent thrush  Chronic Kidney Disease Stage 3a - GFR 45-59 (Mild to Moderately Reduced Function): Current medications: lisinopril 2.5 mg by mouth once daily, empagliflozin (Jardiance) 10 mg by mouth once daily, metformin 1,000 mg by mouth twice daily, dulaglutide (Trulicity) 1.5 mg subcutaneously weekly, torsemide 20 mg daily as needed, and atorvastatin 80 mg by mouth once daily Intolerances: none Taking medications as directed: yes Side effects thought to be attributed to current medication regimen: no Denies dizziness, lightheadedness, blurred vision, and headache Most recent GFR: 45 mL/min Most recent microalbumin: 12 (06/27/20) Continue medications as above. May need to dose reduce metformin if GFR declines further.  Consider addition of spironolactone Encouraged healthy diet including more fruits and vegetables Discussed need for and importance of continued work on weight loss Discussed need for improved control of blood sugar  Patient Goals/Self-Care Activities Patient will:  Take medications as prescribed Check blood sugar once a day at the following times: fasting (at least 8 hours since last food consumption) and whenever patient experiences symptoms of hypo/hyperglycemia, document, and provide at future appointments Weigh daily, and contact provider if weight gain of more than 3 lbs in 1 day or more than 5 lbs in 1 week Target a minimum of 150 minutes of moderate intensity exercise weekly  Follow Up Plan: Next PCP appointment scheduled for: 02/12/22     Medication Assistance: None required.  Patient affirms current coverage meets needs.  Patient's preferred pharmacy is:  [facility] [address] [place] - [place] [address] [place] [address] Phone: [phone] Fax:  [phone]  Follow Up:  Patient agrees to Care Plan and Follow-up.  Plan: Next PCP appointment scheduled for: 02/12/22  Kennon Holter, PharmD, [place], CPP Clinical Pharmacist Practitioner [facility] [phone]

## 2022-02-06 ENCOUNTER — Encounter: Payer: Self-pay | Admitting: Internal Medicine

## 2022-02-06 ENCOUNTER — Other Ambulatory Visit: Payer: Self-pay | Admitting: Internal Medicine

## 2022-02-06 DIAGNOSIS — E119 Type 2 diabetes mellitus without complications: Secondary | ICD-10-CM

## 2022-02-06 DIAGNOSIS — E559 Vitamin D deficiency, unspecified: Secondary | ICD-10-CM

## 2022-02-06 DIAGNOSIS — I10 Essential (primary) hypertension: Secondary | ICD-10-CM

## 2022-02-06 DIAGNOSIS — E785 Hyperlipidemia, unspecified: Secondary | ICD-10-CM

## 2022-02-06 DIAGNOSIS — J4540 Moderate persistent asthma, uncomplicated: Secondary | ICD-10-CM

## 2022-02-06 DIAGNOSIS — I5032 Chronic diastolic (congestive) heart failure: Secondary | ICD-10-CM

## 2022-02-06 MED ORDER — ATORVASTATIN CALCIUM 80 MG PO TABS: 80.0000 mg | ORAL_TABLET | Freq: Every day | ORAL | 3 refills | Status: DC

## 2022-02-06 MED ORDER — BUPROPION HCL ER (XL) 300 MG PO TB24: 300.0000 mg | ORAL_TABLET | Freq: Every day | ORAL | 1 refills | Status: DC

## 2022-02-12 ENCOUNTER — Ambulatory Visit (INDEPENDENT_AMBULATORY_CARE_PROVIDER_SITE_OTHER): Payer: Medicare Other | Admitting: Internal Medicine

## 2022-02-12 ENCOUNTER — Encounter: Payer: Self-pay | Admitting: Internal Medicine

## 2022-02-12 ENCOUNTER — Other Ambulatory Visit: Payer: Self-pay

## 2022-02-12 VITALS — BP 128/82 | HR 78 | Resp 18 | Ht 69.0 in | Wt 300.6 lb

## 2022-02-12 DIAGNOSIS — E039 Hypothyroidism, unspecified: Secondary | ICD-10-CM

## 2022-02-12 DIAGNOSIS — I10 Essential (primary) hypertension: Secondary | ICD-10-CM

## 2022-02-12 DIAGNOSIS — E782 Mixed hyperlipidemia: Secondary | ICD-10-CM

## 2022-02-12 DIAGNOSIS — E119 Type 2 diabetes mellitus without complications: Secondary | ICD-10-CM

## 2022-02-12 DIAGNOSIS — I421 Obstructive hypertrophic cardiomyopathy: Secondary | ICD-10-CM

## 2022-02-12 DIAGNOSIS — I5032 Chronic diastolic (congestive) heart failure: Secondary | ICD-10-CM

## 2022-02-12 DIAGNOSIS — L304 Erythema intertrigo: Secondary | ICD-10-CM

## 2022-02-12 DIAGNOSIS — E559 Vitamin D deficiency, unspecified: Secondary | ICD-10-CM

## 2022-02-12 DIAGNOSIS — E785 Hyperlipidemia, unspecified: Secondary | ICD-10-CM | POA: Diagnosis not present

## 2022-02-12 DIAGNOSIS — J4540 Moderate persistent asthma, uncomplicated: Secondary | ICD-10-CM

## 2022-02-12 MED ORDER — CLOTRIMAZOLE-BETAMETHASONE 1-0.05 % EX CREA: TOPICAL_CREAM | CUTANEOUS | 0 refills | Status: DC

## 2022-02-12 NOTE — Progress Notes (Signed)
Established Patient Office Visit  Subjective:  Patient ID: Erin Sexton, female    DOB: 1971-02-20  Age: 51 y.o. MRN: [id number]  CC:  Chief Complaint  Patient presents with   Follow-up    3 month follow up pt is fasting last labs were with gray if you need lab work     HPI Erin Sexton is a 51 y.o. female with past medical history of HTN, type II DM, hypothyroidism, HLD, OSA, asthma and morbid obesity who presents for f/u of her chronic medical conditions.  Type II DM: Her blood glucose has been better controlled with Trulicity now.  She also takes Jardiance and metformin for it.  Denies any polyuria or polydipsia currently.  She also takes statin for HLD. She had lost 15 lbs with Trulicity, but gained 10 lbs since then as she was not able to walk due to her knee pain.  HTN and HOCM: BP is well-controlled. Takes medications regularly. Patient denies headache, dizziness, chest pain, or palpitations.  She follows up with cardiology for HOCM.  She has chronic leg swelling and exertional dyspnea.  Hypothyroidism: She has been taking Levothyroxine 150 mcg QD.  Denies any recent change in appetite, tremors or palpitations.  She complains of itching and redness in sacral area, for which she has used Clotrimazole cream and requests a refill of it.   Past Medical History:  Diagnosis Date   Abnormal CT scan    a. 02/2014: evaluated for dyspnea and initially placed on Xarelto for PE because radiologist could not exclude small peripheral pulmonary emboli because of limited contrast. However, after admission for ++vaginal bleeding several days later, it was felt she did NOT have PE - underwent further testing with neg VQ 03/06/14, neg LE duplex.   Anemia    Anxiety    Arthritis    Asthma    Blood transfusion without reported diagnosis    Bone spur of right foot    CHF (congestive heart failure) (HCC)    COPD (chronic obstructive pulmonary disease) ([place])    Depression    Diabetes  mellitus without complication ([place])    a. Dx 02/2014.   Heart murmur    History of echocardiogram 2015   normal EF, Grade II diastolic dysfunction   HOCM (hypertrophic obstructive cardiomyopathy) ([place])    a. Dx 30092.   Hyperlipidemia    Hypertension    Morbid obesity (HCC)    NSVT (nonsustained ventricular tachycardia)    a. Noted on tele during 02/2014 adm for symptomatic anemia.   Stroke [facility])    SVT (supraventricular tachycardia) ([place])    a. Noted on tele during 02/2014 adm for symptomatic anemia.   Thyroid disease    Vaginal bleeding     Past Surgical History:  Procedure Laterality Date   COLONOSCOPY N/A 09/30/2019   Procedure: COLONOSCOPY;  Surgeon: Rogene Houston, MD;  Location: [facility];  Service: Endoscopy;  Laterality: N/A;  [place] N/A 08/07/2016   Procedure: DILATATION & CURETTAGE/HYSTEROSCOPY WITH ATTPEMPTED NOVASURE ABLATION AND INSERTION OF IUD;  Surgeon: Florian Buff, MD;  Location: [facility] ORS;  Service: Gynecology;  Laterality: N/A;    Family History  Problem Relation Age of Onset   Diabetes Mother    Arthritis Mother    Other Mother        WPW   Mental illness Mother        Depression   Asthma Mother  Obesity Mother    Heart disease Father        History of coronary bypass grafting at age 28   Hypertension Father    Mental illness Sister    Bipolar disorder Sister    Heart disease Sister        possibly WPW   Depression Sister    Varicose Veins Sister    Uterine cancer Maternal Grandmother    Mental illness Maternal Grandmother    Arthritis Maternal Grandmother    Hyperlipidemia Maternal Grandmother    Heart disease Maternal Grandfather    CAD Neg Hx        No family history of EARLY CAD    Social History   Socioeconomic History   Marital status: Divorced    Spouse name: Not on file   Number of children: 0   Years of education: Not on file   Highest education level: Not on file   Occupational History   Occupation: Disabled  Tobacco Use   Smoking status: Never   Smokeless tobacco: Never  Vaping Use   Vaping Use: Never used  Substance and Sexual Activity   Alcohol use: No   Drug use: No   Sexual activity: Not Currently    Birth control/protection: I.U.D., Post-menopausal    Comment: IUD in place to control vaginal bleeding  Other Topics Concern   Not on file  Social History Narrative   Divorced for 3 years,married for 5 years.On disability since 2009 secondary to mental illness.   Social Determinants of Health   Financial Resource Strain: Low Risk    Difficulty of Paying Living Expenses: Not hard at all  Food Insecurity: No Food Insecurity   Worried About Charity fundraiser in the Last Year: Never true   [place] in the Last Year: Never true  Transportation Needs: No Transportation Needs   Lack of Transportation (Medical): No   Lack of Transportation (Non-Medical): No  Physical Activity: Insufficiently Active   Days of Exercise per Week: 7 days   Minutes of Exercise per Session: 20 min  Stress: No Stress Concern Present   Feeling of Stress : Not at all  Social Connections: Socially Isolated   Frequency of Communication with Friends and Family: More than three times a week   Frequency of Social Gatherings with Friends and Family: More than three times a week   Attends Religious Services: Never   Marine scientist or Organizations: No   Attends Music therapist: Never   Marital Status: Divorced  Human resources officer Violence: Not At Risk   Fear of Current or Ex-Partner: No   Emotionally Abused: No   Physically Abused: No   Sexually Abused: No    Outpatient Medications Prior to Visit  Medication Sig Dispense Refill   Accu-Chek Softclix Lancets lancets USE 1 LANCET TO CHECK GLUCOSE ONCE DAILY 100 each 3   albuterol (PROVENTIL) (5 MG/ML) 0.5% nebulizer solution Take 0.5 mLs (2.5 mg total) by nebulization every 6 (six) hours  as needed for wheezing or shortness of breath. 20 mL 0   albuterol (VENTOLIN HFA) 108 (90 Base) MCG/ACT inhaler Inhale 2 puffs into the lungs as needed (wheezing).      atorvastatin (LIPITOR) 80 MG tablet Take 1 tablet (80 mg total) by mouth daily. 90 tablet 3   blood glucose meter kit and supplies KIT Dispense based on patient and insurance preference. Use once a day. Diagnosis code:E11.9 1 each 0   buPROPion [employer]  XL) 300 MG 24 hr tablet Take 1 tablet (300 mg total) by mouth daily. 90 tablet 1   cetirizine (ZYRTEC ALLERGY) 10 MG tablet Take 1 tablet (10 mg total) by mouth daily. 30 tablet 0   Cholecalciferol (VITAMIN D3 PO) Take 10,000 Units by mouth daily.     clindamycin (CLEOCIN T) 1 % lotion      Dulaglutide (TRULICITY) 1.5 UL/8.4TX SOPN Inject 1.5 mg into the skin once a week. 2 mL 1   estradiol (ESTRACE) 2 MG tablet Take 1 tablet (2 mg total) by mouth at bedtime. 90 tablet 3   glucose blood (ACCU-CHEK GUIDE) test strip USE 1 STRIP TO CHECK GLUCOSE ONCE DAILY 100 each 3   JARDIANCE 10 MG TABS tablet Take 1 tablet (10 mg total) by mouth daily. 90 tablet 1   Lancet Device MISC Use to check your blood sugar daily 100 each 5   levonorgestrel (MIRENA) 20 MCG/24HR IUD 1 each by Intrauterine route once.     levothyroxine (SYNTHROID) 150 MCG tablet Take 1 tablet (150 mcg total) by mouth daily. 90 tablet 3   lisinopril (ZESTRIL) 2.5 MG tablet Take 1 tablet (2.5 mg total) by mouth daily. 90 tablet 0   Magnesium 200 MG TABS Take 1 tablet by mouth as needed.     megestrol (MEGACE) 40 MG tablet Take 1 tablet by mouth once daily 30 tablet 11   metFORMIN (GLUCOPHAGE) 500 MG tablet Take 2 tablets (1,000 mg total) by mouth 2 (two) times daily. 360 tablet 0   metoprolol tartrate (LOPRESSOR) 50 MG tablet Take 1 tablet (50 mg total) by mouth 2 (two) times daily. 180 tablet 0   potassium chloride SA (KLOR-CON M) 20 MEQ tablet Take 1 tablet (20 mEq total) by mouth as needed (with lasix). 30 tablet 3    SYMBICORT 160-4.5 MCG/ACT inhaler Inhale 2 puffs into the lungs as needed. 1 each 2   torsemide (DEMADEX) 20 MG tablet Take 1 tablet (20 mg total) by mouth as needed. 30 tablet 3   clotrimazole-betamethasone (LOTRISONE) cream APPLY CREAM TOPICALLY TWICE DAILY 15 g 0   No facility-administered medications prior to visit.    No Known Allergies  ROS Review of Systems  Constitutional:  Negative for chills and fever.  HENT:  Negative for congestion, sinus pressure, sinus pain and sore throat.   Eyes:  Negative for pain and discharge.  Respiratory:  Positive for shortness of breath. Negative for cough.   Cardiovascular:  Positive for leg swelling. Negative for chest pain and palpitations.  Gastrointestinal:  Negative for abdominal pain, diarrhea, nausea and vomiting.  Endocrine: Negative for polydipsia and polyuria.  Genitourinary:  Negative for dysuria and hematuria.  Musculoskeletal:  Positive for arthralgias. Negative for neck pain and neck stiffness.  Skin:  Negative for rash.  Neurological:  Negative for dizziness and weakness.  Psychiatric/Behavioral:  Negative for agitation and behavioral problems.      Objective:    Physical Exam Vitals reviewed.  Constitutional:      General: She is not in acute distress.    Appearance: She is obese. She is not diaphoretic.  HENT:     Head: Normocephalic and atraumatic.     Nose: Nose normal. No congestion.     Mouth/Throat:     Mouth: Mucous membranes are moist.     Pharynx: No posterior oropharyngeal erythema.  Eyes:     General: No scleral icterus.    Extraocular Movements: Extraocular movements intact.  Cardiovascular:  Rate and Rhythm: Normal rate and regular rhythm.     Pulses: Normal pulses.     Heart sounds: Normal heart sounds. No murmur heard. Pulmonary:     Breath sounds: Normal breath sounds. No wheezing or rales.  Abdominal:     Palpations: Abdomen is soft.     Tenderness: There is no abdominal tenderness.   Musculoskeletal:     Cervical back: Neck supple. No tenderness.     Right lower leg: Edema (1+) present.     Left lower leg: Edema (1+) present.  Skin:    General: Skin is warm.     Findings: No rash.  Neurological:     General: No focal deficit present.     Mental Status: She is alert and oriented to person, place, and time.  Psychiatric:        Mood and Affect: Mood normal.        Behavior: Behavior normal.    BP 128/82 (BP Location: Left Arm, Patient Position: Sitting, Cuff Size: Normal)    Pulse 78    Resp 18    Ht $R'5\' 9"'Ph$  (1.753 m)    Wt (!) 300 lb 9.6 oz (136.4 kg)    SpO2 98%    BMI 44.39 kg/m  Wt Readings from Last 3 Encounters:  02/12/22 (!) 300 lb 9.6 oz (136.4 kg)  11/20/21 295 lb 1.9 oz (133.9 kg)  10/26/21 297 lb (134.7 kg)    Lab Results  Component Value Date   TSH 1.370 02/12/2022   Lab Results  Component Value Date   WBC 11.6 (H) 08/01/2021   HGB 16.5 (H) 08/01/2021   HCT 46.9 (H) 08/01/2021   MCV 89 08/01/2021   PLT 277 08/01/2021   Lab Results  Component Value Date   NA 144 02/12/2022   K 4.0 02/12/2022   CO2 17 (L) 02/12/2022   GLUCOSE 86 02/12/2022   BUN 17 02/12/2022   CREATININE 1.18 (H) 02/12/2022   BILITOT 0.5 02/12/2022   ALKPHOS 95 02/12/2022   AST 15 02/12/2022   ALT 18 02/12/2022   PROT 6.1 02/12/2022   ALBUMIN 4.0 02/12/2022   CALCIUM 9.1 02/12/2022   ANIONGAP 11 10/26/2021   EGFR 55 (L) 02/12/2022   Lab Results  Component Value Date   CHOL 106 02/12/2022   Lab Results  Component Value Date   HDL 33 (L) 02/12/2022   Lab Results  Component Value Date   LDLCALC 46 02/12/2022   Lab Results  Component Value Date   TRIG 155 (H) 02/12/2022   Lab Results  Component Value Date   CHOLHDL 3.2 02/12/2022   Lab Results  Component Value Date   HGBA1C 6.3 (H) 02/12/2022      Assessment & Plan:   Problem List Items Addressed This Visit       Cardiovascular and Mediastinum   Essential hypertension, benign    BP  Readings from Last 1 Encounters:  02/12/22 128/82  Well-controlled Counseled for compliance with the medications Advised DASH diet and moderate exercise/walking, at least 150 mins/week       HOCM (hypertrophic obstructive cardiomyopathy) ([place])    Noted on recent echo Followed by cardiology - plan to get cardiac MRI On lisinopril, Jardiance and torsemide for HFpEF        Respiratory   Asthma, chronic    Well-controlled with Symbicort and as needed albuterol        Endocrine   Diabetes mellitus type 2, noninsulin dependent ([place]) - Primary  Lab Results  Component Value Date   HGBA1C 6.3 (H) 53/29/9242   On Trulicity, Jardiance and metformin Advised to follow diabetic diet On statin and ACEi F/u CMP and lipid panel Diabetic eye exam: Advised to follow up with Ophthalmology for diabetic eye exam      Relevant Orders   CMP14+EGFR (Completed)   Hemoglobin A1c (Completed)   Hypothyroidism    Lab Results  Component Value Date   TSH 1.370 02/12/2022  On levothyroxine 150 mcg daily      Relevant Orders   TSH + free T4 (Completed)     Other   Vitamin D deficiency disease   Mixed hyperlipidemia    On statin Check lipid profile      Relevant Orders   Lipid Profile (Completed)   Other Visit Diagnoses     Intertrigo       Relevant Medications   clotrimazole-betamethasone (LOTRISONE) cream       Meds ordered this encounter  Medications   clotrimazole-betamethasone (LOTRISONE) cream    Sig: APPLY CREAM TOPICALLY TWICE DAILY    Dispense:  15 g    Refill:  0    Follow-up: Return in about 4 months (around 06/14/2022) for DM and weight check.    Lindell Spar, MD

## 2022-02-12 NOTE — Patient Instructions (Signed)
Please continue taking medications as prescribed.  Please continue to follow low carb diet and ambulate as tolerated.  Please consider getting Shingrix vaccine at your local pharmacy. 

## 2022-02-13 ENCOUNTER — Telehealth: Payer: Self-pay

## 2022-02-13 ENCOUNTER — Encounter: Payer: Self-pay | Admitting: *Deleted

## 2022-02-13 LAB — CMP14+EGFR
ALT: 18 IU/L (ref 0–32)
AST: 15 IU/L (ref 0–40)
Albumin/Globulin Ratio: 1.9 (ref 1.2–2.2)
Albumin: 4 g/dL (ref 3.8–4.9)
Alkaline Phosphatase: 95 IU/L (ref 44–121)
BUN/Creatinine Ratio: 14 (ref 9–23)
BUN: 17 mg/dL (ref 6–24)
Bilirubin Total: 0.5 mg/dL (ref 0.0–1.2)
CO2: 17 mmol/L — ABNORMAL LOW (ref 20–29)
Calcium: 9.1 mg/dL (ref 8.7–10.2)
Chloride: 111 mmol/L — ABNORMAL HIGH (ref 96–106)
Creatinine, Ser: 1.18 mg/dL — ABNORMAL HIGH (ref 0.57–1.00)
Globulin, Total: 2.1 g/dL (ref 1.5–4.5)
Glucose: 86 mg/dL (ref 70–99)
Potassium: 4 mmol/L (ref 3.5–5.2)
Sodium: 144 mmol/L (ref 134–144)
Total Protein: 6.1 g/dL (ref 6.0–8.5)
eGFR: 55 mL/min/{1.73_m2} — ABNORMAL LOW (ref >59)

## 2022-02-13 LAB — LIPID PANEL
Chol/HDL Ratio: 3.2 ratio (ref 0.0–4.4)
Cholesterol, Total: 106 mg/dL (ref 100–199)
HDL: 33 mg/dL — ABNORMAL LOW (ref >39)
LDL Chol Calc (NIH): 46 mg/dL (ref 0–99)
Triglycerides: 155 mg/dL — ABNORMAL HIGH (ref 0–149)
VLDL Cholesterol Cal: 27 mg/dL (ref 5–40)

## 2022-02-13 LAB — HEMOGLOBIN A1C
Est. average glucose Bld gHb Est-mCnc: 134 mg/dL
Hgb A1c MFr Bld: 6.3 % — ABNORMAL HIGH (ref 4.8–5.6)

## 2022-02-13 LAB — TSH+FREE T4
Free T4: 1.96 ng/dL — ABNORMAL HIGH (ref 0.82–1.77)
TSH: 1.37 u[IU]/mL (ref 0.450–4.500)

## 2022-02-13 NOTE — Assessment & Plan Note (Addendum)
Well-controlled with Symbicort and as needed albuterol 

## 2022-02-13 NOTE — Assessment & Plan Note (Signed)
On statin Check lipid profile 

## 2022-02-13 NOTE — Telephone Encounter (Signed)
Pt advised of lab results with verbal understanding  

## 2022-02-13 NOTE — Assessment & Plan Note (Signed)
Lab Results  ?Component Value Date  ? TSH 1.370 02/12/2022  ? ?On levothyroxine 150 mcg daily ?

## 2022-02-13 NOTE — Assessment & Plan Note (Signed)
BP Readings from Last 1 Encounters:  ?02/12/22 128/82  ? ?Well-controlled ?Counseled for compliance with the medications ?Advised DASH diet and moderate exercise/walking, at least 150 mins/week ? ?

## 2022-02-13 NOTE — Assessment & Plan Note (Signed)
Lab Results  ?Component Value Date  ? HGBA1C 6.3 (H) 02/12/2022  ? ? ?On Trulicity, Jardiance and metformin ?Advised to follow diabetic diet ?On statin and ACEi ?F/u CMP and lipid panel ?Diabetic eye exam: Advised to follow up with Ophthalmology for diabetic eye exam ?

## 2022-02-13 NOTE — Telephone Encounter (Signed)
Patient returning call about lab results.  

## 2022-02-13 NOTE — Assessment & Plan Note (Signed)
Noted on recent echo ?Followed by cardiology - plan to get cardiac MRI ?On lisinopril, Jardiance and torsemide for HFpEF ?

## 2022-02-14 DIAGNOSIS — Z20822 Contact with and (suspected) exposure to covid-19: Secondary | ICD-10-CM | POA: Diagnosis not present

## 2022-02-18 ENCOUNTER — Ambulatory Visit: Payer: Medicare Other | Admitting: Internal Medicine

## 2022-02-22 ENCOUNTER — Telehealth (HOSPITAL_COMMUNITY): Payer: Self-pay | Admitting: Emergency Medicine

## 2022-02-22 NOTE — Telephone Encounter (Signed)
Attempted to call patient regarding upcoming cardiac MR appointment. Left message on voicemail with name and callback number Sanav Remer RN Navigator Cardiac Imaging [facility] [phone] Office [phone] Cell  

## 2022-02-25 ENCOUNTER — Ambulatory Visit (HOSPITAL_COMMUNITY): Payer: Medicare Other

## 2022-02-27 ENCOUNTER — Other Ambulatory Visit: Payer: Self-pay | Admitting: *Deleted

## 2022-02-27 ENCOUNTER — Other Ambulatory Visit: Payer: Self-pay | Admitting: Family Medicine

## 2022-02-27 ENCOUNTER — Encounter: Payer: Self-pay | Admitting: Internal Medicine

## 2022-02-27 DIAGNOSIS — E559 Vitamin D deficiency, unspecified: Secondary | ICD-10-CM

## 2022-02-27 DIAGNOSIS — E785 Hyperlipidemia, unspecified: Secondary | ICD-10-CM

## 2022-02-27 DIAGNOSIS — I10 Essential (primary) hypertension: Secondary | ICD-10-CM

## 2022-02-27 DIAGNOSIS — I5032 Chronic diastolic (congestive) heart failure: Secondary | ICD-10-CM

## 2022-02-27 DIAGNOSIS — J4540 Moderate persistent asthma, uncomplicated: Secondary | ICD-10-CM

## 2022-02-27 DIAGNOSIS — E119 Type 2 diabetes mellitus without complications: Secondary | ICD-10-CM

## 2022-02-27 MED ORDER — METFORMIN HCL 500 MG PO TABS: 1000.0000 mg | ORAL_TABLET | Freq: Two times a day (BID) | ORAL | 0 refills | Status: DC

## 2022-03-14 ENCOUNTER — Ambulatory Visit
Admission: RE | Admit: 2022-03-14 | Discharge: 2022-03-14 | Disposition: A | Discharge status: Home / Self Care | Payer: Medicare Other | Source: Ambulatory Visit | Attending: Urgent Care | Admitting: Urgent Care

## 2022-03-14 VITALS — BP 135/79 | HR 72 | Temp 98.0°F | Resp 18

## 2022-03-14 DIAGNOSIS — E119 Type 2 diabetes mellitus without complications: Secondary | ICD-10-CM | POA: Diagnosis not present

## 2022-03-14 DIAGNOSIS — R0981 Nasal congestion: Secondary | ICD-10-CM | POA: Diagnosis not present

## 2022-03-14 DIAGNOSIS — I509 Heart failure, unspecified: Secondary | ICD-10-CM

## 2022-03-14 DIAGNOSIS — J309 Allergic rhinitis, unspecified: Secondary | ICD-10-CM

## 2022-03-14 DIAGNOSIS — R0982 Postnasal drip: Secondary | ICD-10-CM

## 2022-03-14 DIAGNOSIS — J069 Acute upper respiratory infection, unspecified: Secondary | ICD-10-CM | POA: Diagnosis not present

## 2022-03-14 DIAGNOSIS — J4540 Moderate persistent asthma, uncomplicated: Secondary | ICD-10-CM | POA: Diagnosis not present

## 2022-03-14 DIAGNOSIS — R070 Pain in throat: Secondary | ICD-10-CM | POA: Diagnosis not present

## 2022-03-14 DIAGNOSIS — E1159 Type 2 diabetes mellitus with other circulatory complications: Secondary | ICD-10-CM

## 2022-03-14 LAB — POCT RAPID STREP A (OFFICE): Rapid Strep A Screen: NEGATIVE

## 2022-03-14 MED ORDER — PREDNISONE 20 MG PO TABS
ORAL_TABLET | ORAL | 0 refills | Status: DC
Start: 2022-03-14 — End: 2022-05-16

## 2022-03-14 MED ORDER — LEVOCETIRIZINE DIHYDROCHLORIDE 5 MG PO TABS: 5.0000 mg | ORAL_TABLET | Freq: Every evening | ORAL | 0 refills | Status: DC

## 2022-03-14 NOTE — ED Triage Notes (Signed)
Hurts to swallow and neck tenderness since yesterday. ?

## 2022-03-14 NOTE — ED Provider Notes (Signed)
?[place] ? ? ?MRN: [id number] DOB: [birth year] ? ?Subjective:  ? ?Erin Sexton is a 51 y.o. female presenting for 1 day history of persistent sinus congestion, postnasal drainage, woke up with moderate to severe throat pain, painful swallowing today.  Wants to make sure she does not have strep.  No cough, chest pain, shortness of breath or wheezing.  Patient does have allergic rhinitis, does not remember to take Zyrtec regularly.  She also has asthma.  Does not feel like it is a big problem right now.  Patient is a type II diabetic, treated without insulin.  Her blood sugars have been really good, this morning's was 115.  Last A1c was 6.3% this month.  Has a history of congestive heart failure, last echocardiogram showed 60 to 65% ejection fraction from 12/05/2021. ? ?No current facility-administered medications for this encounter. ? ?Current Outpatient Medications:  ?  Accu-Chek Softclix Lancets lancets, USE 1 LANCET TO CHECK GLUCOSE ONCE DAILY, Disp: 100 each, Rfl: 3 ?  albuterol (PROVENTIL) (5 MG/ML) 0.5% nebulizer solution, Take 0.5 mLs (2.5 mg total) by nebulization every 6 (six) hours as needed for wheezing or shortness of breath., Disp: 20 mL, Rfl: 0 ?  albuterol (VENTOLIN HFA) 108 (90 Base) MCG/ACT inhaler, Inhale 2 puffs into the lungs as needed (wheezing). , Disp: , Rfl:  ?  atorvastatin (LIPITOR) 80 MG tablet, Take 1 tablet (80 mg total) by mouth daily., Disp: 90 tablet, Rfl: 3 ?  blood glucose meter kit and supplies KIT, Dispense based on patient and insurance preference. Use once a day. Diagnosis code:E11.9, Disp: 1 each, Rfl: 0 ?  buPROPion (WELLBUTRIN XL) 300 MG 24 hr tablet, Take 1 tablet (300 mg total) by mouth daily., Disp: 90 tablet, Rfl: 1 ?  cetirizine (ZYRTEC ALLERGY) 10 MG tablet, Take 1 tablet (10 mg total) by mouth daily., Disp: 30 tablet, Rfl: 0 ?  Cholecalciferol (VITAMIN D3 PO), Take 10,000 Units by mouth daily., Disp: , Rfl:  ?  clindamycin (CLEOCIN T) 1 % lotion,  , Disp: , Rfl:  ?  clotrimazole-betamethasone (LOTRISONE) cream, APPLY CREAM TOPICALLY TWICE DAILY, Disp: 15 g, Rfl: 0 ?  Dulaglutide (TRULICITY) 1.5 FH/2.1FX SOPN, Inject 1.5 mg into the skin once a week., Disp: 2 mL, Rfl: 1 ?  estradiol (ESTRACE) 2 MG tablet, Take 1 tablet (2 mg total) by mouth at bedtime., Disp: 90 tablet, Rfl: 3 ?  glucose blood (ACCU-CHEK GUIDE) test strip, USE 1 STRIP TO CHECK GLUCOSE ONCE DAILY, Disp: 100 each, Rfl: 3 ?  JARDIANCE 10 MG TABS tablet, Take 1 tablet (10 mg total) by mouth daily., Disp: 90 tablet, Rfl: 1 ?  Lancet Device MISC, Use to check your blood sugar daily, Disp: 100 each, Rfl: 5 ?  levonorgestrel (MIRENA) 20 MCG/24HR IUD, 1 each by Intrauterine route once., Disp: , Rfl:  ?  levothyroxine (SYNTHROID) 150 MCG tablet, Take 1 tablet (150 mcg total) by mouth daily., Disp: 90 tablet, Rfl: 3 ?  lisinopril (ZESTRIL) 2.5 MG tablet, Take 1 tablet (2.5 mg total) by mouth daily., Disp: 90 tablet, Rfl: 0 ?  Magnesium 200 MG TABS, Take 1 tablet by mouth as needed., Disp: , Rfl:  ?  megestrol (MEGACE) 40 MG tablet, Take 1 tablet by mouth once daily, Disp: 30 tablet, Rfl: 11 ?  metFORMIN (GLUCOPHAGE) 500 MG tablet, Take 2 tablets (1,000 mg total) by mouth 2 (two) times daily., Disp: 360 tablet, Rfl: 0 ?  metoprolol tartrate (LOPRESSOR) 50 MG tablet, Take 1  tablet (50 mg total) by mouth 2 (two) times daily., Disp: 180 tablet, Rfl: 0 ?  potassium chloride SA (KLOR-CON M) 20 MEQ tablet, Take 1 tablet (20 mEq total) by mouth as needed (with lasix)., Disp: 30 tablet, Rfl: 3 ?  SYMBICORT 160-4.5 MCG/ACT inhaler, Inhale 2 puffs into the lungs as needed., Disp: 1 each, Rfl: 2 ?  torsemide (DEMADEX) 20 MG tablet, Take 1 tablet (20 mg total) by mouth as needed., Disp: 30 tablet, Rfl: 3  ? ?No Known Allergies ? ?Past Medical History:  ?Diagnosis Date  ? Abnormal CT scan   ? a. 02/2014: evaluated for dyspnea and initially placed on Xarelto for PE because radiologist could not exclude small peripheral  pulmonary emboli because of limited contrast. However, after admission for ++vaginal bleeding several days later, it was felt she did NOT have PE - underwent further testing with neg VQ 03/06/14, neg LE duplex.  ? Anemia   ? Anxiety   ? Arthritis   ? Asthma   ? Blood transfusion without reported diagnosis   ? Bone spur of right foot   ? CHF (congestive heart failure) ([place])   ? COPD (chronic obstructive pulmonary disease) ([place])   ? Depression   ? Diabetes mellitus without complication ([place])   ? a. Dx 02/2014.  ? Heart murmur   ? History of echocardiogram 2015  ? normal EF, Grade II diastolic dysfunction  ? HOCM (hypertrophic obstructive cardiomyopathy) ([place])   ? a. Dx 56812.  ? Hyperlipidemia   ? Hypertension   ? Morbid obesity ([place])   ? NSVT (nonsustained ventricular tachycardia) ([place])   ? a. Noted on tele during 02/2014 adm for symptomatic anemia.  ? Stroke [facility])   ? SVT (supraventricular tachycardia) ([place])   ? a. Noted on tele during 02/2014 adm for symptomatic anemia.  ? Thyroid disease   ? Vaginal bleeding   ?  ? ?Past Surgical History:  ?Procedure Laterality Date  ? COLONOSCOPY N/A 09/30/2019  ? Procedure: COLONOSCOPY;  Surgeon: Rogene Houston, MD;  Location[facility];  Service: Endoscopy;  Laterality: N/A;  730  ? DILITATION & CURRETTAGE/HYSTROSCOPY WITH NOVASURE ABLATION N/A 08/07/2016  ? Procedure: DILATATION & CURETTAGE/HYSTEROSCOPY WITH ATTPEMPTED NOVASURE ABLATION AND INSERTION OF IUD;  Surgeon: Florian Buff, MD;  Location: [facility] ORS;  Service: Gynecology;  Laterality: N/A;  ? ? ?Family History  ?Problem Relation Age of Onset  ? Diabetes Mother   ? Arthritis Mother   ? Other Mother   ?     WPW  ? Mental illness Mother   ?     Depression  ? Asthma Mother   ? Obesity Mother   ? Heart disease Father   ?     History of coronary bypass grafting at age 31  ? Hypertension Father   ? Mental illness Sister   ? Bipolar disorder Sister   ? Heart disease Sister   ?     possibly WPW  ? Depression Sister   ? Varicose  Veins Sister   ? Uterine cancer Maternal Grandmother   ? Mental illness Maternal Grandmother   ? Arthritis Maternal Grandmother   ? Hyperlipidemia Maternal Grandmother   ? Heart disease Maternal Grandfather   ? CAD Neg Hx   ?     No family history of EARLY CAD  ? ? ?Social History  ? ?Tobacco Use  ? Smoking status: Never  ? Smokeless tobacco: Never  ?Vaping Use  ? Vaping Use: Never used  ?  Substance Use Topics  ? Alcohol use: No  ? Drug use: No  ? ? ?ROS ? ? ?Objective:  ? ?Vitals: ?BP 135/79 (BP Location: Right Arm)   Pulse 72   Temp 98 ?F (36.7 ?C) (Oral)   Resp 18   SpO2 98%  ? ?Physical Exam ?Constitutional:   ?   General: She is not in acute distress. ?   Appearance: Normal appearance. She is well-developed. She is obese. She is not ill-appearing, toxic-appearing or diaphoretic.  ?HENT:  ?   Head: Normocephalic and atraumatic.  ?   Right Ear: Tympanic membrane, ear canal and external ear normal. No drainage or tenderness. No middle ear effusion. There is no impacted cerumen. Tympanic membrane is not erythematous.  ?   Left Ear: Tympanic membrane, ear canal and external ear normal. No drainage or tenderness.  No middle ear effusion. There is no impacted cerumen. Tympanic membrane is not erythematous.  ?   Nose: Congestion present. No rhinorrhea.  ?   Mouth/Throat:  ?   Mouth: Mucous membranes are moist. No oral lesions.  ?   Pharynx: No pharyngeal swelling, oropharyngeal exudate, posterior oropharyngeal erythema or uvula swelling.  ?   Tonsils: No tonsillar exudate or tonsillar abscesses.  ?   Comments: Thick streaks of postnasal drainage overlying the pharynx. ?Eyes:  ?   General: No scleral icterus.    ?   Right eye: No discharge.     ?   Left eye: No discharge.  ?   Extraocular Movements: Extraocular movements intact.  ?   Right eye: Normal extraocular motion.  ?   Left eye: Normal extraocular motion.  ?   Conjunctiva/sclera: Conjunctivae normal.  ?Cardiovascular:  ?   Rate and Rhythm: Normal rate.  ?    Heart sounds: No murmur heard. ?  No friction rub. No gallop.  ?Pulmonary:  ?   Effort: Pulmonary effort is normal. No respiratory distress.  ?   Breath sounds: No stridor. No wheezing, rhonchi or rales.

## 2022-03-17 LAB — CULTURE, GROUP A STREP (THRC)

## 2022-03-23 ENCOUNTER — Other Ambulatory Visit (INDEPENDENT_AMBULATORY_CARE_PROVIDER_SITE_OTHER): Payer: Self-pay | Admitting: Nurse Practitioner

## 2022-03-23 DIAGNOSIS — E119 Type 2 diabetes mellitus without complications: Secondary | ICD-10-CM

## 2022-03-23 DIAGNOSIS — I5032 Chronic diastolic (congestive) heart failure: Secondary | ICD-10-CM

## 2022-03-23 DIAGNOSIS — I10 Essential (primary) hypertension: Secondary | ICD-10-CM

## 2022-03-23 DIAGNOSIS — E559 Vitamin D deficiency, unspecified: Secondary | ICD-10-CM

## 2022-03-23 DIAGNOSIS — J4540 Moderate persistent asthma, uncomplicated: Secondary | ICD-10-CM

## 2022-03-23 DIAGNOSIS — E785 Hyperlipidemia, unspecified: Secondary | ICD-10-CM

## 2022-03-25 ENCOUNTER — Other Ambulatory Visit: Payer: Self-pay | Admitting: *Deleted

## 2022-03-25 DIAGNOSIS — E559 Vitamin D deficiency, unspecified: Secondary | ICD-10-CM

## 2022-03-25 DIAGNOSIS — J4540 Moderate persistent asthma, uncomplicated: Secondary | ICD-10-CM

## 2022-03-25 DIAGNOSIS — I5032 Chronic diastolic (congestive) heart failure: Secondary | ICD-10-CM

## 2022-03-25 DIAGNOSIS — Z20822 Contact with and (suspected) exposure to covid-19: Secondary | ICD-10-CM | POA: Diagnosis not present

## 2022-03-25 DIAGNOSIS — E119 Type 2 diabetes mellitus without complications: Secondary | ICD-10-CM

## 2022-03-25 DIAGNOSIS — I10 Essential (primary) hypertension: Secondary | ICD-10-CM

## 2022-03-25 DIAGNOSIS — E785 Hyperlipidemia, unspecified: Secondary | ICD-10-CM

## 2022-03-25 MED ORDER — LISINOPRIL 2.5 MG PO TABS: 2.5000 mg | ORAL_TABLET | Freq: Every day | ORAL | 0 refills | Status: DC

## 2022-04-05 ENCOUNTER — Other Ambulatory Visit (INDEPENDENT_AMBULATORY_CARE_PROVIDER_SITE_OTHER): Payer: Self-pay | Admitting: Nurse Practitioner

## 2022-04-05 ENCOUNTER — Other Ambulatory Visit: Payer: Self-pay | Admitting: Internal Medicine

## 2022-04-05 DIAGNOSIS — J4540 Moderate persistent asthma, uncomplicated: Secondary | ICD-10-CM

## 2022-04-05 DIAGNOSIS — I5032 Chronic diastolic (congestive) heart failure: Secondary | ICD-10-CM

## 2022-04-05 DIAGNOSIS — E119 Type 2 diabetes mellitus without complications: Secondary | ICD-10-CM

## 2022-04-05 DIAGNOSIS — E559 Vitamin D deficiency, unspecified: Secondary | ICD-10-CM

## 2022-04-05 DIAGNOSIS — E785 Hyperlipidemia, unspecified: Secondary | ICD-10-CM

## 2022-04-05 DIAGNOSIS — I10 Essential (primary) hypertension: Secondary | ICD-10-CM

## 2022-04-08 ENCOUNTER — Encounter: Payer: Self-pay | Admitting: Internal Medicine

## 2022-04-08 ENCOUNTER — Other Ambulatory Visit: Payer: Self-pay | Admitting: Internal Medicine

## 2022-04-08 DIAGNOSIS — E119 Type 2 diabetes mellitus without complications: Secondary | ICD-10-CM

## 2022-04-08 MED ORDER — TRULICITY 3 MG/0.5ML ~~LOC~~ SOAJ: 3.0000 mg | SUBCUTANEOUS | 1 refills | Status: DC

## 2022-04-09 MED ORDER — METOPROLOL TARTRATE 50 MG PO TABS: 50.0000 mg | ORAL_TABLET | Freq: Two times a day (BID) | ORAL | 0 refills | Status: DC

## 2022-04-17 DIAGNOSIS — R059 Cough, unspecified: Secondary | ICD-10-CM | POA: Diagnosis not present

## 2022-04-17 DIAGNOSIS — Z20822 Contact with and (suspected) exposure to covid-19: Secondary | ICD-10-CM | POA: Diagnosis not present

## 2022-04-17 DIAGNOSIS — R051 Acute cough: Secondary | ICD-10-CM | POA: Diagnosis not present

## 2022-05-09 ENCOUNTER — Telehealth: Payer: Self-pay | Admitting: Obstetrics & Gynecology

## 2022-05-09 NOTE — Telephone Encounter (Signed)
Patient has the [place]. She states that last night you had heavy bleeding with clots, today is slight spotting. She hasn't had a period since being mirena being placed. Please advise. She was going to direct message you on mychart but it wasn't an option to do so.

## 2022-05-09 NOTE — Telephone Encounter (Signed)
Sent pt Mychart msg so she could get a thread started per request.

## 2022-05-16 ENCOUNTER — Encounter: Payer: Self-pay | Admitting: Obstetrics & Gynecology

## 2022-05-16 ENCOUNTER — Ambulatory Visit (INDEPENDENT_AMBULATORY_CARE_PROVIDER_SITE_OTHER): Payer: Medicare Other | Admitting: Obstetrics & Gynecology

## 2022-05-16 ENCOUNTER — Other Ambulatory Visit (HOSPITAL_COMMUNITY)
Admission: RE | Admit: 2022-05-16 | Discharge: 2022-05-16 | Disposition: A | Discharge status: Home / Self Care | Payer: Medicare Other | Source: Ambulatory Visit | Attending: Obstetrics & Gynecology | Admitting: Obstetrics & Gynecology

## 2022-05-16 VITALS — BP 141/87 | HR 78 | Ht 68.0 in | Wt 292.4 lb

## 2022-05-16 DIAGNOSIS — E119 Type 2 diabetes mellitus without complications: Secondary | ICD-10-CM | POA: Diagnosis not present

## 2022-05-16 DIAGNOSIS — N8502 Endometrial intraepithelial neoplasia [EIN]: Secondary | ICD-10-CM | POA: Diagnosis not present

## 2022-05-16 DIAGNOSIS — Z8669 Personal history of other diseases of the nervous system and sense organs: Secondary | ICD-10-CM | POA: Diagnosis not present

## 2022-05-16 DIAGNOSIS — Z975 Presence of (intrauterine) contraceptive device: Secondary | ICD-10-CM

## 2022-05-16 DIAGNOSIS — N950 Postmenopausal bleeding: Secondary | ICD-10-CM

## 2022-05-16 DIAGNOSIS — T8332XA Displacement of intrauterine contraceptive device, initial encounter: Secondary | ICD-10-CM

## 2022-05-16 DIAGNOSIS — Z538 Procedure and treatment not carried out for other reasons: Secondary | ICD-10-CM | POA: Diagnosis not present

## 2022-05-16 DIAGNOSIS — Z79890 Hormone replacement therapy (postmenopausal): Secondary | ICD-10-CM | POA: Diagnosis not present

## 2022-05-16 DIAGNOSIS — I5032 Chronic diastolic (congestive) heart failure: Secondary | ICD-10-CM | POA: Diagnosis not present

## 2022-05-16 DIAGNOSIS — Z8673 Personal history of transient ischemic attack (TIA), and cerebral infarction without residual deficits: Secondary | ICD-10-CM | POA: Diagnosis not present

## 2022-05-16 DIAGNOSIS — N8500 Endometrial hyperplasia, unspecified: Secondary | ICD-10-CM | POA: Diagnosis not present

## 2022-05-16 MED ORDER — LEVONORGESTREL 20 MCG/DAY IU IUD: 1.0000 | INTRAUTERINE_SYSTEM | Freq: Once | INTRAUTERINE | Status: DC

## 2022-05-16 NOTE — Progress Notes (Signed)
GYN VISIT Patient name: Erin Sexton MRN [id number]  Date of birth: [birth year] Chief Complaint:   IUD Insertion (Removal of old device and insertion of new)  History of Present Illness:   Erin Sexton is a 51 y.o. G0P0000 PM female being seen today for the following concerns:  Endometrial hyperplasia- currently being treated with Mirena- device placed, 2018.  She had called in report moderate to heavy bleeding almost as though her period had returned.  Bleeding lasted for 2 days then light spotting for a few more days.   Also notes passage of grape-sized clots.  Prior to this, she had not had bleeding for a few years  Records reviewed- last path 07/2016- complex atypical hyperplasia in polyp with progestin effects  No LMP recorded. (Menstrual status: IUD).     02/12/2022    2:32 PM 11/20/2021    1:17 PM 08/27/2021   10:32 AM 08/08/2021    8:10 AM 08/01/2021    8:35 AM  Depression screen PHQ 2/9  Decreased Interest 0 0 0 3 1  Down, Depressed, Hopeless 0 0 0 3 3  PHQ - 2 Score 0 0 0 6 4  Altered sleeping   0 3 3  Tired, decreased energy   0 1 1  Change in appetite   0 0 0  Feeling bad or failure about yourself    0 2 3  Trouble concentrating   0 0 0  Moving slowly or fidgety/restless   0 0 0  Suicidal thoughts   0 0 0  PHQ-9 Score   0 12 11  Difficult doing work/chores   Not difficult at all Not difficult at all Very difficult     Review of Systems:   Pertinent items are noted in HPI Denies fever/chills, dizziness, headaches, visual disturbances, fatigue, shortness of breath, chest pain, abdominal pain, vomiting Pertinent History Reviewed:  Reviewed past medical,surgical, social, obstetrical and family history.  Reviewed problem list, medications and allergies. Physical Assessment:   Vitals:   05/16/22 1519  BP: (!) 141/87  Pulse: 78  Weight: 292 lb 6.4 oz (132.6 kg)  Height: '5\' 8"'$  (1.727 m)  Body mass index is 44.46 kg/m.       Physical Examination:    General appearance: alert, well appearing, and in no distress  Psych: mood appropriate, normal affect  Skin: warm & dry   Cardiovascular: normal heart rate noted  Respiratory: normal respiratory effort, no distress  Abdomen: obese, soft and non-tender  Pelvic: VULVA: normal appearing vulva with no masses, tenderness or lesions, VAGINA: normal appearing vagina with normal color and discharge, no lesions, CERVIX: normal appearing cervix without discharge or lesions, strings not visualized  Extremities: no calf tenderness  Chaperone: Celene Squibb     Endometrial Biopsy  and IUD remvoal Procedure Note  Pre-operative Diagnosis: Postmenopausal bleeding, complex atypical hyperplasia  Post-operative Diagnosis: same  Procedure Details   The risks (including infection, bleeding, pain, and uterine perforation) and benefits of the procedure were explained to the patient and Written informed consent was obtained.  Antibiotic prophylaxis against endocarditis was not indicated.   The patient was placed in the dorsal lithotomy position.  Bimanual exam showed the uterus to be in the neutral position.  A speculum inserted in the vagina, and the cervix prepped with betadine.     A single tooth tenaculum was applied to the anterior lip of the cervix for stabilization.  A Pipelle endometrial aspirator was used to sample the endometrium.  Sample was sent for pathologic examination. Despire EMB and use of hook- strings not visualized.  Unable to remove IUD/replace device  Condition: Stable  Complications: None  Plan:    Assessment & Plan:  1) PMB, Atypical complex hyperplasia Next step pending results of pathology.   Discussed concern for endometrial carcinoma Reviewed that next step would be surgical intervention; however due to her multiple co-morbidities she is not an ideal surgical candidate  2) Lost IUD strings -pt concerned that it may have fallen out with bleeding -plan for pelvic US  to evaluate endometrium  3) HRT -advise to ween of estrogen over the next 2 weeks -ok to continue with Megace  4) CHF, T2DM, h/o TIA, morbid obesity  Orders Placed This Encounter  Procedures   US PELVIC COMPLETE WITH TRANSVAGINAL    Return for TBD.   Janyth Pupa, DO Attending [place], [facility] for [employer], [place]

## 2022-05-20 ENCOUNTER — Encounter: Payer: Self-pay | Admitting: Obstetrics & Gynecology

## 2022-05-20 LAB — SURGICAL PATHOLOGY

## 2022-05-22 ENCOUNTER — Ambulatory Visit (HOSPITAL_COMMUNITY): Payer: Medicare Other

## 2022-05-22 ENCOUNTER — Encounter (HOSPITAL_COMMUNITY): Payer: Self-pay

## 2022-05-23 ENCOUNTER — Ambulatory Visit
Admission: EM | Admit: 2022-05-23 | Discharge: 2022-05-23 | Disposition: A | Discharge status: Home / Self Care | Payer: Medicare Other | Attending: Nurse Practitioner | Admitting: Nurse Practitioner

## 2022-05-23 DIAGNOSIS — R519 Headache, unspecified: Secondary | ICD-10-CM

## 2022-05-23 DIAGNOSIS — R110 Nausea: Secondary | ICD-10-CM

## 2022-05-23 MED ORDER — SUMATRIPTAN SUCCINATE 6 MG/0.5ML ~~LOC~~ SOLN
6.0000 mg | Freq: Once | SUBCUTANEOUS | Status: AC
  Administered 2022-05-23: 6 mg via SUBCUTANEOUS

## 2022-05-23 MED ORDER — ONDANSETRON 4 MG PO TBDP
4.0000 mg | ORAL_TABLET | Freq: Once | ORAL | Status: AC
Start: 2022-05-23 — End: 2022-05-23
  Administered 2022-05-23: 4 mg via ORAL

## 2022-05-23 NOTE — ED Triage Notes (Signed)
Pt reports headache and nausea x 3-4 days. OTC sinus meds gives no relief.

## 2022-05-23 NOTE — Discharge Instructions (Signed)
COVID/flu test is pending.  You will be contacted if the results are positive. Take medication as prescribed. Increase fluids.  You should be drinking at least 6-8 8 ounce glasses of water daily. Cool cloths to your forehead to help with headache pain. Go to the emergency department immediately if you develop worsening headache, blurred vision, change in your vision, weakness, inability to walk, or change in your mental status. If your symptoms do not improve, please follow-up with your primary care physician.

## 2022-05-23 NOTE — ED Provider Notes (Signed)
[facility]    CSN: [id number] Arrival date & time: 05/23/22  1229      History   Chief Complaint Chief Complaint  Patient presents with   Headache   Nausea    HPI Erin Sexton is a 51 y.o. female.   The history is provided by the patient.   Patient presents for complaints of headache and nausea that has been present for the past 4 to 5 days.  Patient also complains of sensitivity to light and sound.  Patient reports a history of migraines, but states her last headache was more than 20 years ago.  She informs that she has an IUD for endometriosis that was supposed to be exchanged approximately 1 week ago.  She states before that appointment, she had a "large".,  With heavy bleeding and clotting.  She states when she went to the gynecologist, they cannot find the string to the IUD, patient thinks she may have passed that with the menstrual bleeding that she experienced.  She also has a history of diabetes, states her A1c was 6.3, her glucose this morning was 111.  She denies fever, chills, sore throat, chest pain, shortness of breath, difficulty breathing, numbness, tingling, or weakness.  Patient states the headache is located in the top of her head and in the back of her head on both sides.  She states "I feel like my head is going to explode".  She has been taking over-the-counter medications for her symptoms with no relief.  Past Medical History:  Diagnosis Date   Abnormal CT scan    a. 02/2014: evaluated for dyspnea and initially placed on Xarelto for PE because radiologist could not exclude small peripheral pulmonary emboli because of limited contrast. However, after admission for ++vaginal bleeding several days later, it was felt she did NOT have PE - underwent further testing with neg VQ 03/06/14, neg LE duplex.   Anemia    Anxiety    Arthritis    Asthma    Blood transfusion without reported diagnosis    Bone spur of right foot    CHF (congestive heart failure)  (HCC)    COPD (chronic obstructive pulmonary disease) ([place])    Depression    Diabetes mellitus without complication ([place])    a. Dx 02/2014.   Heart murmur    History of echocardiogram 2015   normal EF, Grade II diastolic dysfunction   HOCM (hypertrophic obstructive cardiomyopathy) ([place])    a. Dx 14481.   Hyperlipidemia    Hypertension    Morbid obesity ([place])    NSVT (nonsustained ventricular tachycardia) ([place])    a. Noted on tele during 02/2014 adm for symptomatic anemia.   Stroke [facility])    SVT (supraventricular tachycardia) ([place])    a. Noted on tele during 02/2014 adm for symptomatic anemia.   Thyroid disease    Vaginal bleeding     Patient Active Problem List   Diagnosis Date Noted   Mixed hyperlipidemia 02/12/2022   Leg swelling 08/08/2021   Hypothyroidism 08/08/2021   Vitamin D deficiency disease 07/25/2020   TIA (transient ischemic attack) 06/20/2016   Chronic diastolic CHF (congestive heart failure) ([place]) 06/20/2016   Complex endometrial hyperplasia without atypia 06/08/2014   Menorrhagia 06/01/2014   Physical deconditioning 05/25/2014   Diabetes mellitus type 2, noninsulin dependent ([place])    History of sleep apnea 03/21/2014   HOCM (hypertrophic obstructive cardiomyopathy) ([place]) 03/06/2014   Morbid obesity ([place]) 03/02/2014   Essential hypertension, benign 03/02/2014  Asthma, chronic 03/02/2014    Past Surgical History:  Procedure Laterality Date   COLONOSCOPY N/A 09/30/2019   Procedure: COLONOSCOPY;  Surgeon: Rogene Houston, MD;  Location: [facility];  Service: Endoscopy;  Laterality: N/A;  [place] N/A 08/07/2016   Procedure: DILATATION & CURETTAGE/HYSTEROSCOPY WITH ATTPEMPTED NOVASURE ABLATION AND INSERTION OF IUD;  Surgeon: Florian Buff, MD;  Location: [facility];  Service: Gynecology;  Laterality: N/A;    OB History     Gravida  0   Para  0   Term  0   Preterm  0   AB  0   Living  0       SAB  0   IAB  0   Ectopic  0   Multiple  0   Live Births  0            Home Medications    Prior to Admission medications   Medication Sig Start Date End Date Taking? Authorizing Provider  Accu-Chek Softclix Lancets lancets USE 1 LANCET TO CHECK GLUCOSE ONCE DAILY 08/08/21   [name], NP  albuterol (PROVENTIL) (5 MG/ML) 0.5% nebulizer solution Take 0.5 mLs (2.5 mg total) by nebulization every 6 (six) hours as needed for wheezing or shortness of breath. 06/27/20   Ailene Ards, NP  albuterol (VENTOLIN HFA) 108 (90 Base) MCG/ACT inhaler Inhale 2 puffs into the lungs as needed (wheezing).     [provider]  atorvastatin (LIPITOR) 80 MG tablet Take 1 tablet (80 mg total) by mouth daily. 02/06/22   Lindell Spar, MD  blood glucose meter kit and supplies KIT Dispense based on patient and insurance preference. Use once a day. Diagnosis code:E11.9 10/26/19   Hurshel Party C, MD  buPROPion (WELLBUTRIN XL) 300 MG 24 hr tablet Take 1 tablet (300 mg total) by mouth daily. 02/06/22   Lindell Spar, MD  Cholecalciferol (VITAMIN D3 PO) Take 10,000 Units by mouth daily.    [provider]  clindamycin (CLEOCIN T) 1 % lotion  07/22/20   [provider]  clotrimazole-betamethasone (LOTRISONE) cream APPLY CREAM TOPICALLY TWICE DAILY 02/12/22   Lindell Spar, MD  Dulaglutide (TRULICITY) 3 XB/2.8UX SOPN Inject 3 mg as directed once a week. 04/08/22   Lindell Spar, MD  estradiol (ESTRACE) 2 MG tablet Take 1 tablet (2 mg total) by mouth at bedtime. 12/24/21   Florian Buff, MD  glucose blood (ACCU-CHEK GUIDE) test strip USE 1 STRIP TO CHECK GLUCOSE ONCE DAILY 08/08/21   [name], NP  JARDIANCE 10 MG TABS tablet Take 1 tablet (10 mg total) by mouth daily. 01/18/22   Lindell Spar, MD  Lancet Device MISC Use to check your blood sugar daily 01/24/20   Ailene Ards, NP  levocetirizine (XYZAL) 5 MG tablet Take 1 tablet (5 mg total) by mouth every evening. 03/14/22    Jaynee Eagles, PA-C  levonorgestrel (MIRENA) 20 MCG/24HR IUD 1 each by Intrauterine route once. 04/03/16   [provider]  levothyroxine (SYNTHROID) 150 MCG tablet Take 1 tablet (150 mcg total) by mouth daily. 08/02/21   [name], NP  lisinopril (ZESTRIL) 2.5 MG tablet Take 1 tablet (2.5 mg total) by mouth daily. 03/25/22   Lindell Spar, MD  Magnesium 200 MG TABS Take 1 tablet by mouth as needed. Patient not taking: Reported on 05/16/2022    [provider]  megestrol (MEGACE) 40  MG tablet Take 1 tablet by mouth once daily 11/04/21   Florian Buff, MD  metFORMIN (GLUCOPHAGE) 500 MG tablet Take 2 tablets (1,000 mg total) by mouth 2 (two) times daily. 02/27/22   Lindell Spar, MD  metoprolol tartrate (LOPRESSOR) 50 MG tablet Take 1 tablet (50 mg total) by mouth 2 (two) times daily. 04/09/22   Lindell Spar, MD  potassium chloride SA (KLOR-CON M) 20 MEQ tablet Take 1 tablet (20 mEq total) by mouth as needed (with lasix). 11/06/21   Donato Heinz, MD  SYMBICORT 160-4.5 MCG/ACT inhaler Inhale 2 puffs into the lungs as needed. 08/10/21   [name], NP  torsemide (DEMADEX) 20 MG tablet Take 1 tablet (20 mg total) by mouth as needed. 11/06/21   Donato Heinz, MD    Family History Family History  Problem Relation Age of Onset   Diabetes Mother    Arthritis Mother    Other Mother        WPW   Mental illness Mother        Depression   Asthma Mother    Obesity Mother    Heart disease Father        History of coronary bypass grafting at age 83   Hypertension Father    Mental illness Sister    Bipolar disorder Sister    Heart disease Sister        possibly WPW   Depression Sister    Varicose Veins Sister    Uterine cancer Maternal Grandmother    Mental illness Maternal Grandmother    Arthritis Maternal Grandmother    Hyperlipidemia Maternal Grandmother    Heart disease Maternal Grandfather    CAD Neg Hx        No family history of EARLY CAD     Social History Social History   Tobacco Use   Smoking status: Never   Smokeless tobacco: Never  Vaping Use   Vaping Use: Never used  Substance Use Topics   Alcohol use: No   Drug use: No     Allergies   Patient has no known allergies.   Review of Systems Review of Systems Per HPI  Physical Exam Triage Vital Signs ED Triage Vitals  Enc Vitals Group     BP 05/23/22 1247 (!) 170/86     Pulse Rate 05/23/22 1247 70     Resp 05/23/22 1247 20     Temp 05/23/22 1247 98.2 F (36.8 C)     Temp Source 05/23/22 1247 Oral     SpO2 05/23/22 1247 97 %     Weight --      Height --      Head Circumference --      Peak Flow --      Pain Score 05/23/22 1245 10     Pain Loc --      Pain Edu? --      Excl. in [place]? --    No data found.  Updated Vital Signs BP (!) 170/86 (BP Location: Right Arm)   Pulse 70   Temp 98.2 F (36.8 C) (Oral)   Resp 20   SpO2 97%   Visual Acuity Right Eye Distance:   Left Eye Distance:   Bilateral Distance:    Right Eye Near:   Left Eye Near:    Bilateral Near:     Physical Exam Vitals and nursing note reviewed.  Constitutional:      General: She is not in acute  distress.    Appearance: She is well-developed.  HENT:     Head: Normocephalic.     Mouth/Throat:     Mouth: Mucous membranes are moist.  Eyes:     General: No visual field deficit.    Extraocular Movements: Extraocular movements intact.     Pupils: Pupils are equal, round, and reactive to light.  Cardiovascular:     Rate and Rhythm: Normal rate and regular rhythm.     Heart sounds: Normal heart sounds.  Pulmonary:     Effort: Pulmonary effort is normal.     Breath sounds: Normal breath sounds.  Abdominal:     General: Bowel sounds are normal.     Palpations: Abdomen is soft.     Tenderness: There is no abdominal tenderness.  Musculoskeletal:     Cervical back: Normal range of motion and neck supple.  Skin:    General: Skin is warm and dry.  Neurological:      Mental Status: She is alert.     GCS: GCS eye subscore is 4. GCS verbal subscore is 5. GCS motor subscore is 6.     Cranial Nerves: No cranial nerve deficit, dysarthria or facial asymmetry.     Sensory: No sensory deficit.     Motor: No weakness.     Coordination: Coordination normal.  Psychiatric:        Mood and Affect: Mood normal.        Speech: Speech normal.        Behavior: Behavior normal.      UC Treatments / Results  Labs (all labs ordered are listed, but only abnormal results are displayed) Labs Reviewed  COVID-19, FLU A+B NAA    EKG   Radiology No results found.  Procedures Procedures (including critical care time)  Medications Ordered in UC Medications  ondansetron (ZOFRAN-ODT) disintegrating tablet 4 mg (4 mg Oral Given 05/23/22 1347)  SUMAtriptan (IMITREX) injection 6 mg (6 mg Subcutaneous Given 05/23/22 1347)    Initial Impression / Assessment and Plan / UC Course  I have reviewed the triage vital signs and the nursing notes.  Pertinent labs & imaging results that were available during my care of the patient were reviewed by me and considered in my medical decision making (see chart for details).  Patient presents with headache that has been present for the past 4 to 5 days.  She has tried multiple over-the-counter medications with no relief.  Her her vital signs are reassuring along with her exam.  There are no neurological deficits present.  Patient has a history of migraine headaches, and she reports recent menstrual symptoms.  Cannot rule out this as the cause of her headaches.  We will also get a COVID/flu test today.  Patient was given injection of Imitrex in the clinic.  She was also given Zofran to help with her nausea.  We will provide prescription for ibuprofen and Zofran for symptomatic treatment at home.  Supportive care recommendations were provided to the patient.  Strict return precautions of when to go to the emergency department were provided.   Patient advised to follow-up with her primary care physician if her symptoms do not improve. Final Clinical Impressions(s) / UC Diagnoses   Final diagnoses:  Acute intractable headache, unspecified headache type  Nausea without vomiting     Discharge Instructions      COVID/flu test is pending.  You will be contacted if the results are positive. Take medication as prescribed. Increase fluids.  You  should be drinking at least 6-8 8 ounce glasses of water daily. Cool cloths to your forehead to help with headache pain. Go to the emergency department immediately if you develop worsening headache, blurred vision, change in your vision, weakness, inability to walk, or change in your mental status. If your symptoms do not improve, please follow-up with your primary care physician.     ED Prescriptions   None    PDMP not reviewed this encounter.   Tish Men, NP 05/23/22 1404

## 2022-05-24 ENCOUNTER — Telehealth: Payer: Self-pay | Admitting: Obstetrics & Gynecology

## 2022-05-24 NOTE — Telephone Encounter (Signed)
Patient is wanting to speak to a nurse to see if you can have estrogen withdrawals. Ozan wasn't able to find iud at her visit.Please advise.

## 2022-05-25 LAB — COVID-19, FLU A+B NAA
Influenza A, NAA: NOT DETECTED
Influenza B, NAA: NOT DETECTED
SARS-CoV-2, NAA: NOT DETECTED

## 2022-05-31 ENCOUNTER — Other Ambulatory Visit: Payer: Self-pay | Admitting: Internal Medicine

## 2022-05-31 DIAGNOSIS — E119 Type 2 diabetes mellitus without complications: Secondary | ICD-10-CM

## 2022-05-31 DIAGNOSIS — E785 Hyperlipidemia, unspecified: Secondary | ICD-10-CM

## 2022-05-31 DIAGNOSIS — I10 Essential (primary) hypertension: Secondary | ICD-10-CM

## 2022-05-31 DIAGNOSIS — E559 Vitamin D deficiency, unspecified: Secondary | ICD-10-CM

## 2022-05-31 DIAGNOSIS — J4540 Moderate persistent asthma, uncomplicated: Secondary | ICD-10-CM

## 2022-05-31 DIAGNOSIS — I5032 Chronic diastolic (congestive) heart failure: Secondary | ICD-10-CM

## 2022-06-03 ENCOUNTER — Encounter: Payer: Self-pay | Admitting: Obstetrics & Gynecology

## 2022-06-03 ENCOUNTER — Ambulatory Visit (INDEPENDENT_AMBULATORY_CARE_PROVIDER_SITE_OTHER): Payer: Medicare Other | Admitting: Obstetrics & Gynecology

## 2022-06-03 VITALS — BP 130/85 | HR 77 | Ht 68.0 in

## 2022-06-03 DIAGNOSIS — Z30433 Encounter for removal and reinsertion of intrauterine contraceptive device: Secondary | ICD-10-CM

## 2022-06-03 DIAGNOSIS — N950 Postmenopausal bleeding: Secondary | ICD-10-CM | POA: Diagnosis not present

## 2022-06-03 DIAGNOSIS — N8501 Benign endometrial hyperplasia: Secondary | ICD-10-CM

## 2022-06-03 MED ORDER — LEVONORGESTREL 20 MCG/DAY IU IUD
1.0000 | INTRAUTERINE_SYSTEM | Freq: Once | INTRAUTERINE | Status: AC
  Administered 2022-06-03: 1 via INTRAUTERINE

## 2022-06-13 ENCOUNTER — Ambulatory Visit (INDEPENDENT_AMBULATORY_CARE_PROVIDER_SITE_OTHER): Payer: Medicare Other | Admitting: Internal Medicine

## 2022-06-13 ENCOUNTER — Encounter: Payer: Self-pay | Admitting: Internal Medicine

## 2022-06-13 VITALS — BP 138/86 | HR 77 | Resp 18 | Ht 69.0 in | Wt 292.0 lb

## 2022-06-13 DIAGNOSIS — E782 Mixed hyperlipidemia: Secondary | ICD-10-CM

## 2022-06-13 DIAGNOSIS — E559 Vitamin D deficiency, unspecified: Secondary | ICD-10-CM

## 2022-06-13 DIAGNOSIS — K429 Umbilical hernia without obstruction or gangrene: Secondary | ICD-10-CM | POA: Diagnosis not present

## 2022-06-13 DIAGNOSIS — E119 Type 2 diabetes mellitus without complications: Secondary | ICD-10-CM | POA: Diagnosis not present

## 2022-06-13 DIAGNOSIS — J4540 Moderate persistent asthma, uncomplicated: Secondary | ICD-10-CM | POA: Diagnosis not present

## 2022-06-13 DIAGNOSIS — E039 Hypothyroidism, unspecified: Secondary | ICD-10-CM

## 2022-06-13 DIAGNOSIS — I10 Essential (primary) hypertension: Secondary | ICD-10-CM | POA: Diagnosis not present

## 2022-06-13 DIAGNOSIS — I5032 Chronic diastolic (congestive) heart failure: Secondary | ICD-10-CM

## 2022-06-13 MED ORDER — JARDIANCE 10 MG PO TABS: 10.0000 mg | ORAL_TABLET | Freq: Every day | ORAL | 3 refills | Status: DC

## 2022-06-13 MED ORDER — TRULICITY 3 MG/0.5ML ~~LOC~~ SOAJ: 3.0000 mg | SUBCUTANEOUS | 3 refills | Status: DC

## 2022-06-13 MED ORDER — METFORMIN HCL 500 MG PO TABS: 500.0000 mg | ORAL_TABLET | Freq: Two times a day (BID) | ORAL | 3 refills | Status: DC

## 2022-06-13 MED ORDER — LEVOCETIRIZINE DIHYDROCHLORIDE 5 MG PO TABS: 5.0000 mg | ORAL_TABLET | Freq: Every evening | ORAL | 3 refills | Status: DC

## 2022-06-13 MED ORDER — METOPROLOL TARTRATE 50 MG PO TABS: 50.0000 mg | ORAL_TABLET | Freq: Two times a day (BID) | ORAL | 1 refills | Status: DC

## 2022-06-13 MED ORDER — LISINOPRIL 2.5 MG PO TABS: 2.5000 mg | ORAL_TABLET | Freq: Every day | ORAL | 3 refills | Status: DC

## 2022-06-13 MED ORDER — BUPROPION HCL ER (XL) 300 MG PO TB24: 300.0000 mg | ORAL_TABLET | Freq: Every day | ORAL | 3 refills | Status: DC

## 2022-06-13 NOTE — Assessment & Plan Note (Addendum)
Noted on recent echo Followed by cardiology On lisinopril, Jardiance and torsemide for HFpEF 

## 2022-06-13 NOTE — Assessment & Plan Note (Signed)
BP Readings from Last 1 Encounters:  06/13/22 138/86   Well-controlled Counseled for compliance with the medications Advised DASH diet and moderate exercise/walking, at least 150 mins/week

## 2022-06-13 NOTE — Assessment & Plan Note (Signed)
Asymptomatic currently Reassured it being benign

## 2022-06-13 NOTE — Progress Notes (Signed)
Established Patient Office Visit  Subjective:  Patient ID: Erin Sexton, female    DOB: 1971-12-04  Age: 51 y.o. MRN: [id number]  CC:  Chief Complaint  Patient presents with   Follow-up    Follow up DM and weight check would like umbilical hernia looked at as she thinks this is growing    HPI Erin Sexton is a 51 y.o. female with past medical history of HTN, type II DM, hypothyroidism, HLD, OSA, asthma and morbid obesity who presents for f/u of her chronic medical conditions.  She complains of bulging over umbilical area.  She has been told of umbilical hernia in the past.  Denies any pain, itching or irritation locally.  Denies any nausea, vomiting or diarrhea.  Type II DM: Her blood glucose has been better controlled with Trulicity now.  She also takes Jardiance and metformin for it.  Denies any polyuria or polydipsia currently.  She also takes statin for HLD. She had lost 8 lbs with Trulicity since the last visit.  HTN and HOCM: BP is well-controlled. Takes medications regularly. Patient denies headache, dizziness, chest pain, or palpitations.  She follows up with cardiology for HOCM.  She has chronic leg swelling and exertional dyspnea.   Hypothyroidism: She has been taking Levothyroxine 150 mcg QD.  Denies any recent change in appetite, tremors or palpitations.     Past Medical History:  Diagnosis Date   Abnormal CT scan    a. 02/2014: evaluated for dyspnea and initially placed on Xarelto for PE because radiologist could not exclude small peripheral pulmonary emboli because of limited contrast. However, after admission for ++vaginal bleeding several days later, it was felt she did NOT have PE - underwent further testing with neg VQ 03/06/14, neg LE duplex.   Anemia    Anxiety    Arthritis    Asthma    Blood transfusion without reported diagnosis    Bone spur of right foot    CHF (congestive heart failure) (HCC)    COPD (chronic obstructive pulmonary disease) ([place])     Depression    Diabetes mellitus without complication ([place])    a. Dx 02/2014.   Heart murmur    History of echocardiogram 2015   normal EF, Grade II diastolic dysfunction   HOCM (hypertrophic obstructive cardiomyopathy) ([place])    a. Dx 40973.   Hyperlipidemia    Hypertension    Morbid obesity ([place])    NSVT (nonsustained ventricular tachycardia) ([place])    a. Noted on tele during 02/2014 adm for symptomatic anemia.   Stroke [facility])    SVT (supraventricular tachycardia) ([place])    a. Noted on tele during 02/2014 adm for symptomatic anemia.   Thyroid disease    Vaginal bleeding     Past Surgical History:  Procedure Laterality Date   COLONOSCOPY N/A 09/30/2019   Procedure: COLONOSCOPY;  Surgeon: Rogene Houston, MD;  Location: [facility];  Service: Endoscopy;  Laterality: N/A;  [place] N/A 08/07/2016   Procedure: DILATATION & CURETTAGE/HYSTEROSCOPY WITH ATTPEMPTED NOVASURE ABLATION AND INSERTION OF IUD;  Surgeon: Florian Buff, MD;  Location: [facility] OR[facility];  Service: Gynecology;  Laterality: N/A;    Family History  Problem Relation Age of Onset   Diabetes Mother    Arthritis Mother    Other Mother        WPW   Mental illness Mother        Depression   Asthma Mother  Obesity Mother    Heart disease Father        History of coronary bypass grafting at age 65   Hypertension Father    Mental illness Sister    Bipolar disorder Sister    Heart disease Sister        possibly WPW   Depression Sister    Varicose Veins Sister    Uterine cancer Maternal Grandmother    Mental illness Maternal Grandmother    Arthritis Maternal Grandmother    Hyperlipidemia Maternal Grandmother    Heart disease Maternal Grandfather    CAD Neg Hx        No family history of EARLY CAD    Social History   Socioeconomic History   Marital status: Divorced    Spouse name: Not on file   Number of children: 0   Years of education: Not on file    Highest education level: Not on file  Occupational History   Occupation: Disabled  Tobacco Use   Smoking status: Never   Smokeless tobacco: Never  Vaping Use   Vaping Use: Never used  Substance and Sexual Activity   Alcohol use: No   Drug use: No   Sexual activity: Not Currently    Birth control/protection: I.U.D., Post-menopausal    Comment: IUD in place to control vaginal bleeding  Other Topics Concern   Not on file  Social History Narrative   Divorced for 3 years,married for 5 years.On disability since 2009 secondary to mental illness.   Social Determinants of Health   Financial Resource Strain: Low Risk  (08/27/2021)   Overall Financial Resource Strain (CARDIA)    Difficulty of Paying Living Expenses: Not hard at all  Food Insecurity: No Food Insecurity (08/27/2021)   Hunger Vital Sign    Worried About Running Out of Food in the Last Year: Never true    Ran Out of Food in the Last Year: Never true  Transportation Needs: No Transportation Needs (08/27/2021)   PRAPARE - Hydrologist (Medical): No    Lack of Transportation (Non-Medical): No  Physical Activity: Insufficiently Active (08/27/2021)   Exercise Vital Sign    Days of Exercise per Week: 7 days    Minutes of Exercise per Session: 20 min  Stress: No Stress Concern Present (08/27/2021)   [place]    Feeling of Stress : Not at all  Social Connections: Socially Isolated (08/27/2021)   Social Connection and Isolation Panel [NHANES]    Frequency of Communication with Friends and Family: More than three times a week    Frequency of Social Gatherings with Friends and Family: More than three times a week    Attends Religious Services: Never    Marine scientist or Organizations: No    Attends Archivist Meetings: Never    Marital Status: Divorced  Human resources officer Violence: Not At Risk (08/27/2021)   Humiliation,  Afraid, Rape, and Kick questionnaire    Fear of Current or Ex-Partner: No    Emotionally Abused: No    Physically Abused: No    Sexually Abused: No    Outpatient Medications Prior to Visit  Medication Sig Dispense Refill   Accu-Chek Softclix Lancets lancets USE 1 LANCET TO CHECK GLUCOSE ONCE DAILY 100 each 3   albuterol (PROVENTIL) (5 MG/ML) 0.5% nebulizer solution Take 0.5 mLs (2.5 mg total) by nebulization every 6 (six) hours as needed for wheezing or shortness of  breath. 20 mL 0   albuterol (VENTOLIN HFA) 108 (90 Base) MCG/ACT inhaler Inhale 2 puffs into the lungs as needed (wheezing).      atorvastatin (LIPITOR) 80 MG tablet Take 1 tablet (80 mg total) by mouth daily. 90 tablet 3   blood glucose meter kit and supplies KIT Dispense based on patient and insurance preference. Use once a day. Diagnosis code:E11.9 1 each 0   Cholecalciferol (VITAMIN D3 PO) Take 10,000 Units by mouth daily.     clindamycin (CLEOCIN T) 1 % lotion      clotrimazole-betamethasone (LOTRISONE) cream APPLY CREAM TOPICALLY TWICE DAILY 15 g 0   estradiol (ESTRACE) 2 MG tablet Take 1 tablet (2 mg total) by mouth at bedtime. 90 tablet 3   glucose blood (ACCU-CHEK GUIDE) test strip USE 1 STRIP TO CHECK GLUCOSE ONCE DAILY 100 each 3   Lancet Device MISC Use to check your blood sugar daily 100 each 5   levonorgestrel (MIRENA) 20 MCG/24HR IUD 1 each by Intrauterine route once.     levothyroxine (SYNTHROID) 150 MCG tablet Take 1 tablet (150 mcg total) by mouth daily. 90 tablet 3   Magnesium 200 MG TABS Take 1 tablet by mouth as needed.     megestrol (MEGACE) 40 MG tablet Take 1 tablet by mouth once daily 30 tablet 11   potassium chloride SA (KLOR-CON M) 20 MEQ tablet Take 1 tablet (20 mEq total) by mouth as needed (with lasix). 30 tablet 3   SYMBICORT 160-4.5 MCG/ACT inhaler Inhale 2 puffs into the lungs as needed. 1 each 2   torsemide (DEMADEX) 20 MG tablet Take 1 tablet (20 mg total) by mouth as needed. 30 tablet 3    buPROPion (WELLBUTRIN XL) 300 MG 24 hr tablet Take 1 tablet (300 mg total) by mouth daily. 90 tablet 1   JARDIANCE 10 MG TABS tablet Take 1 tablet (10 mg total) by mouth daily. 90 tablet 1   levocetirizine (XYZAL) 5 MG tablet Take 1 tablet (5 mg total) by mouth every evening. 90 tablet 0   lisinopril (ZESTRIL) 2.5 MG tablet Take 1 tablet (2.5 mg total) by mouth daily. 90 tablet 0   metFORMIN (GLUCOPHAGE) 500 MG tablet Take 2 tablets by mouth twice daily 360 tablet 0   metoprolol tartrate (LOPRESSOR) 50 MG tablet Take 1 tablet (50 mg total) by mouth 2 (two) times daily. 294 tablet 0   TRULICITY 3 TM/5.4YT SOPN INJECT 3 MG AS DIRECTED ONCE A WEEK 4 mL 0   No facility-administered medications prior to visit.    No Known Allergies  ROS Review of Systems  Constitutional:  Negative for chills and fever.  HENT:  Negative for congestion, sinus pressure, sinus pain and sore throat.   Eyes:  Negative for pain and discharge.  Respiratory:  Negative for cough and shortness of breath.   Cardiovascular:  Positive for leg swelling. Negative for chest pain and palpitations.  Gastrointestinal:  Negative for abdominal pain, diarrhea, nausea and vomiting.  Endocrine: Negative for polydipsia and polyuria.  Genitourinary:  Negative for dysuria and hematuria.  Musculoskeletal:  Positive for arthralgias. Negative for neck pain and neck stiffness.  Skin:  Negative for rash.  Neurological:  Negative for dizziness and weakness.  Psychiatric/Behavioral:  Negative for agitation and behavioral problems.       Objective:    Physical Exam Vitals reviewed.  Constitutional:      General: She is not in acute distress.    Appearance: She is obese. She is  not diaphoretic.  HENT:     Head: Normocephalic and atraumatic.     Nose: Nose normal. No congestion.     Mouth/Throat:     Mouth: Mucous membranes are moist.     Pharynx: No posterior oropharyngeal erythema.  Eyes:     General: No scleral icterus.     Extraocular Movements: Extraocular movements intact.  Cardiovascular:     Rate and Rhythm: Normal rate and regular rhythm.     Pulses: Normal pulses.     Heart sounds: Normal heart sounds. No murmur heard. Pulmonary:     Breath sounds: Normal breath sounds. No wheezing or rales.  Abdominal:     Palpations: Abdomen is soft.     Tenderness: There is no abdominal tenderness.     Hernia: A hernia (Umbilical) is present.  Musculoskeletal:     Cervical back: Neck supple. No tenderness.     Right lower leg: Edema (1+) present.     Left lower leg: Edema (1+) present.  Skin:    General: Skin is warm.     Findings: No rash.  Neurological:     General: No focal deficit present.     Mental Status: She is alert and oriented to person, place, and time.  Psychiatric:        Mood and Affect: Mood normal.        Behavior: Behavior normal.     BP 138/86 (BP Location: Right Arm, Patient Position: Sitting, Cuff Size: Normal)   Pulse 77   Resp 18   Ht _0  (1.753 m)   Wt 292 lb (132.5 kg)   SpO2 98%   BMI 43.12 kg/m  Wt Readings from Last 3 Encounters:  06/13/22 292 lb (132.5 kg)  05/16/22 292 lb 6.4 oz (132.6 kg)  02/12/22 (!) 300 lb 9.6 oz (136.4 kg)    Lab Results  Component Value Date   TSH 1.370 02/12/2022   Lab Results  Component Value Date   WBC 11.6 (H) 08/01/2021   HGB 16.5 (H) 08/01/2021   HCT 46.9 (H) 08/01/2021   MCV 89 08/01/2021   PLT 277 08/01/2021   Lab Results  Component Value Date   NA 144 02/12/2022   K 4.0 02/12/2022   CO2 17 (L) 02/12/2022   GLUCOSE 86 02/12/2022   BUN 17 02/12/2022   CREATININE 1.18 (H) 02/12/2022   BILITOT 0.5 02/12/2022   ALKPHOS 95 02/12/2022   AST 15 02/12/2022   ALT 18 02/12/2022   PROT 6.1 02/12/2022   ALBUMIN 4.0 02/12/2022   CALCIUM 9.1 02/12/2022   ANIONGAP 11 10/26/2021   EGFR 55 (L) 02/12/2022   Lab Results  Component Value Date   CHOL 106 02/12/2022   Lab Results  Component Value Date   HDL 33 (L) 02/12/2022    Lab Results  Component Value Date   LDLCALC 46 02/12/2022   Lab Results  Component Value Date   TRIG 155 (H) 02/12/2022   Lab Results  Component Value Date   CHOLHDL 3.2 02/12/2022   Lab Results  Component Value Date   HGBA1C 6.3 (H) 02/12/2022      Assessment & Plan:   Problem List Items Addressed This Visit       Cardiovascular and Mediastinum   Essential hypertension, benign    BP Readings from Last 1 Encounters:  06/13/22 138/86  Well-controlled Counseled for compliance with the medications Advised DASH diet and moderate exercise/walking, at least 150 mins/week      Relevant  Medications   buPROPion (WELLBUTRIN XL) 300 MG 24 hr tablet   lisinopril (ZESTRIL) 2.5 MG tablet   metFORMIN (GLUCOPHAGE) 500 MG tablet   metoprolol tartrate (LOPRESSOR) 50 MG tablet   Chronic diastolic CHF (congestive heart failure) (HCC)    Noted on recent echo Followed by cardiology On lisinopril, Jardiance and torsemide for HFpEF      Relevant Medications   buPROPion (WELLBUTRIN XL) 300 MG 24 hr tablet   lisinopril (ZESTRIL) 2.5 MG tablet   metFORMIN (GLUCOPHAGE) 500 MG tablet   metoprolol tartrate (LOPRESSOR) 50 MG tablet     Respiratory   Asthma, chronic    Well-controlled with Symbicort and as needed albuterol      Relevant Medications   buPROPion (WELLBUTRIN XL) 300 MG 24 hr tablet   levocetirizine (XYZAL) 5 MG tablet   lisinopril (ZESTRIL) 2.5 MG tablet   metFORMIN (GLUCOPHAGE) 500 MG tablet   metoprolol tartrate (LOPRESSOR) 50 MG tablet     Endocrine   Diabetes mellitus type 2, noninsulin dependent ([place]) - Primary    Lab Results  Component Value Date   HGBA1C 6.3 (H) 03/54/6568   On Trulicity, Jardiance and metformin Home blood glucose readings reviewed -mostly between 100-120 Decreased dose of metformin to 500 mg twice daily Advised to follow diabetic diet On statin and ACEi F/u CMP and lipid panel Diabetic eye exam: Advised to follow up with  Ophthalmology for diabetic eye exam      Relevant Medications   buPROPion (WELLBUTRIN XL) 300 MG 24 hr tablet   JARDIANCE 10 MG TABS tablet   lisinopril (ZESTRIL) 2.5 MG tablet   metFORMIN (GLUCOPHAGE) 500 MG tablet   metoprolol tartrate (LOPRESSOR) 50 MG tablet   Dulaglutide (TRULICITY) 3 LE/7.5TZ SOPN   Other Relevant Orders   Basic Metabolic Panel (BMET)   Hemoglobin A1c   Hypothyroidism    Lab Results  Component Value Date   TSH 1.370 02/12/2022  On levothyroxine 150 mcg daily Borderline elevated free T4 in the last visit, which recheck TSH and free T4      Relevant Medications   metoprolol tartrate (LOPRESSOR) 50 MG tablet   Other Relevant Orders   TSH + free T4     Other   Vitamin D deficiency disease   Relevant Medications   buPROPion (WELLBUTRIN XL) 300 MG 24 hr tablet   lisinopril (ZESTRIL) 2.5 MG tablet   metFORMIN (GLUCOPHAGE) 500 MG tablet   metoprolol tartrate (LOPRESSOR) 50 MG tablet   Mixed hyperlipidemia    On statin Reviewed lipid profile      Relevant Medications   lisinopril (ZESTRIL) 2.5 MG tablet   metoprolol tartrate (LOPRESSOR) 50 MG tablet   Umbilical hernia without obstruction and without gangrene    Asymptomatic currently Reassured it being benign       Meds ordered this encounter  Medications   buPROPion (WELLBUTRIN XL) 300 MG 24 hr tablet    Sig: Take 1 tablet (300 mg total) by mouth daily.    Dispense:  90 tablet    Refill:  3   JARDIANCE 10 MG TABS tablet    Sig: Take 1 tablet (10 mg total) by mouth daily.    Dispense:  90 tablet    Refill:  3   levocetirizine (XYZAL) 5 MG tablet    Sig: Take 1 tablet (5 mg total) by mouth every evening.    Dispense:  90 tablet    Refill:  3   lisinopril (ZESTRIL) 2.5 MG tablet  Sig: Take 1 tablet (2.5 mg total) by mouth daily.    Dispense:  90 tablet    Refill:  3   metFORMIN (GLUCOPHAGE) 500 MG tablet    Sig: Take 1 tablet (500 mg total) by mouth 2 (two) times daily with a meal.     Dispense:  180 tablet    Refill:  3   metoprolol tartrate (LOPRESSOR) 50 MG tablet    Sig: Take 1 tablet (50 mg total) by mouth 2 (two) times daily.    Dispense:  180 tablet    Refill:  1   Dulaglutide (TRULICITY) 3 PC/3.4KB SOPN    Sig: Inject 3 mg as directed once a week.    Dispense:  4 mL    Refill:  3    Follow-up: Return in about 4 months (around 10/14/2022) for DM and HTN.    Lindell Spar, MD

## 2022-06-13 NOTE — Assessment & Plan Note (Signed)
Well-controlled with Symbicort and as needed albuterol 

## 2022-06-13 NOTE — Patient Instructions (Addendum)
Please continue taking medications as prescribed.  Please continue to follow low carb diet and perform moderate exercise/walking as tolerated. 

## 2022-06-13 NOTE — Assessment & Plan Note (Signed)
On statin Reviewed lipid profile 

## 2022-06-13 NOTE — Assessment & Plan Note (Addendum)
Lab Results  Component Value Date   TSH 1.370 02/12/2022   On levothyroxine 150 mcg daily Borderline elevated free T4 in the last visit, which recheck TSH and free T4

## 2022-06-13 NOTE — Assessment & Plan Note (Signed)
Lab Results  Component Value Date   HGBA1C 6.3 (H) 81/85/6314    On Trulicity, Jardiance and metformin Home blood glucose readings reviewed -mostly between 100-120 Decreased dose of metformin to 500 mg twice daily Advised to follow diabetic diet On statin and ACEi F/u CMP and lipid panel Diabetic eye exam: Advised to follow up with Ophthalmology for diabetic eye exam

## 2022-06-14 LAB — BASIC METABOLIC PANEL
BUN/Creatinine Ratio: 11 (ref 9–23)
BUN: 14 mg/dL (ref 6–24)
CO2: 19 mmol/L — ABNORMAL LOW (ref 20–29)
Calcium: 9.2 mg/dL (ref 8.7–10.2)
Chloride: 105 mmol/L (ref 96–106)
Creatinine, Ser: 1.27 mg/dL — ABNORMAL HIGH (ref 0.57–1.00)
Glucose: 83 mg/dL (ref 70–99)
Potassium: 4 mmol/L (ref 3.5–5.2)
Sodium: 143 mmol/L (ref 134–144)
eGFR: 51 mL/min/{1.73_m2} — ABNORMAL LOW (ref >59)

## 2022-06-14 LAB — TSH+FREE T4
Free T4: 1.99 ng/dL — ABNORMAL HIGH (ref 0.82–1.77)
TSH: 1.72 u[IU]/mL (ref 0.450–4.500)

## 2022-06-14 LAB — HEMOGLOBIN A1C
Est. average glucose Bld gHb Est-mCnc: 134 mg/dL
Hgb A1c MFr Bld: 6.3 % — ABNORMAL HIGH (ref 4.8–5.6)

## 2022-06-23 ENCOUNTER — Other Ambulatory Visit: Payer: Self-pay | Admitting: Internal Medicine

## 2022-06-23 DIAGNOSIS — L304 Erythema intertrigo: Secondary | ICD-10-CM

## 2022-07-03 ENCOUNTER — Other Ambulatory Visit (HOSPITAL_COMMUNITY): Payer: Self-pay | Admitting: Internal Medicine

## 2022-07-03 DIAGNOSIS — Z1231 Encounter for screening mammogram for malignant neoplasm of breast: Secondary | ICD-10-CM

## 2022-07-19 ENCOUNTER — Encounter: Payer: Self-pay | Admitting: Internal Medicine

## 2022-07-19 ENCOUNTER — Other Ambulatory Visit: Payer: Self-pay

## 2022-07-19 DIAGNOSIS — E119 Type 2 diabetes mellitus without complications: Secondary | ICD-10-CM

## 2022-07-19 MED ORDER — ACCU-CHEK GUIDE VI STRP: ORAL_STRIP | 3 refills | Status: DC

## 2022-07-19 MED ORDER — LANCET DEVICE MISC: 5 refills | Status: DC

## 2022-07-21 ENCOUNTER — Emergency Department (HOSPITAL_COMMUNITY): Payer: Medicare Other

## 2022-07-21 ENCOUNTER — Encounter (HOSPITAL_COMMUNITY): Payer: Self-pay

## 2022-07-21 ENCOUNTER — Other Ambulatory Visit: Payer: Self-pay

## 2022-07-21 ENCOUNTER — Emergency Department (HOSPITAL_COMMUNITY)
Admission: EM | Admit: 2022-07-21 | Discharge: 2022-07-21 | Disposition: A | Discharge status: Home / Self Care | Payer: Medicare Other | Attending: Emergency Medicine | Admitting: Emergency Medicine

## 2022-07-21 DIAGNOSIS — J45909 Unspecified asthma, uncomplicated: Secondary | ICD-10-CM | POA: Diagnosis not present

## 2022-07-21 DIAGNOSIS — I509 Heart failure, unspecified: Secondary | ICD-10-CM | POA: Diagnosis not present

## 2022-07-21 DIAGNOSIS — I130 Hypertensive heart and chronic kidney disease with heart failure and stage 1 through stage 4 chronic kidney disease, or unspecified chronic kidney disease: Secondary | ICD-10-CM | POA: Diagnosis not present

## 2022-07-21 DIAGNOSIS — Z7951 Long term (current) use of inhaled steroids: Secondary | ICD-10-CM | POA: Diagnosis not present

## 2022-07-21 DIAGNOSIS — J449 Chronic obstructive pulmonary disease, unspecified: Secondary | ICD-10-CM | POA: Diagnosis not present

## 2022-07-21 DIAGNOSIS — E1122 Type 2 diabetes mellitus with diabetic chronic kidney disease: Secondary | ICD-10-CM | POA: Insufficient documentation

## 2022-07-21 DIAGNOSIS — R519 Headache, unspecified: Secondary | ICD-10-CM | POA: Diagnosis not present

## 2022-07-21 DIAGNOSIS — Z7984 Long term (current) use of oral hypoglycemic drugs: Secondary | ICD-10-CM | POA: Diagnosis not present

## 2022-07-21 DIAGNOSIS — N189 Chronic kidney disease, unspecified: Secondary | ICD-10-CM | POA: Diagnosis not present

## 2022-07-21 DIAGNOSIS — Z79899 Other long term (current) drug therapy: Secondary | ICD-10-CM | POA: Diagnosis not present

## 2022-07-21 LAB — BASIC METABOLIC PANEL
Anion gap: 7 (ref 5–15)
BUN: 15 mg/dL (ref 6–20)
CO2: 22 mmol/L (ref 22–32)
Calcium: 8.5 mg/dL — ABNORMAL LOW (ref 8.9–10.3)
Chloride: 112 mmol/L — ABNORMAL HIGH (ref 98–111)
Creatinine, Ser: 1.18 mg/dL — ABNORMAL HIGH (ref 0.44–1.00)
GFR, Estimated: 55 mL/min — ABNORMAL LOW (ref >60)
Glucose, Bld: 107 mg/dL — ABNORMAL HIGH (ref 70–99)
Potassium: 3.8 mmol/L (ref 3.5–5.1)
Sodium: 141 mmol/L (ref 135–145)

## 2022-07-21 LAB — CBC WITH DIFFERENTIAL/PLATELET
Abs Immature Granulocytes: 0.02 10*3/uL (ref 0.00–0.07)
Basophils Absolute: 0.1 10*3/uL (ref 0.0–0.1)
Basophils Relative: 1 %
Eosinophils Absolute: 0.2 10*3/uL (ref 0.0–0.5)
Eosinophils Relative: 2 %
HCT: 47 % — ABNORMAL HIGH (ref 36.0–46.0)
Hemoglobin: 16.1 g/dL — ABNORMAL HIGH (ref 12.0–15.0)
Immature Granulocytes: 0 %
Lymphocytes Relative: 21 %
Lymphs Abs: 2.1 10*3/uL (ref 0.7–4.0)
MCH: 31.2 pg (ref 26.0–34.0)
MCHC: 34.3 g/dL (ref 30.0–36.0)
MCV: 91.1 fL (ref 80.0–100.0)
Monocytes Absolute: 0.5 10*3/uL (ref 0.1–1.0)
Monocytes Relative: 5 %
Neutro Abs: 7.3 10*3/uL (ref 1.7–7.7)
Neutrophils Relative %: 71 %
Platelets: 223 10*3/uL (ref 150–400)
RBC: 5.16 MIL/uL — ABNORMAL HIGH (ref 3.87–5.11)
RDW: 12.4 % (ref 11.5–15.5)
WBC: 10.2 10*3/uL (ref 4.0–10.5)
nRBC: 0 % (ref 0.0–0.2)

## 2022-07-21 LAB — PROTIME-INR
INR: 1 (ref 0.8–1.2)
Prothrombin Time: 13.5 seconds (ref 11.4–15.2)

## 2022-07-21 MED ORDER — DIPHENHYDRAMINE HCL 50 MG/ML IJ SOLN
25.0000 mg | Freq: Once | INTRAMUSCULAR | Status: AC
  Administered 2022-07-21: 25 mg via INTRAVENOUS
  Filled 2022-07-21: qty 1

## 2022-07-21 MED ORDER — METOCLOPRAMIDE HCL 5 MG/ML IJ SOLN
10.0000 mg | Freq: Once | INTRAMUSCULAR | Status: AC
Start: 2022-07-21 — End: 2022-07-21
  Administered 2022-07-21: 10 mg via INTRAVENOUS
  Filled 2022-07-21: qty 2

## 2022-07-21 MED ORDER — KETOROLAC TROMETHAMINE 30 MG/ML IJ SOLN
15.0000 mg | Freq: Once | INTRAMUSCULAR | Status: AC
  Administered 2022-07-21: 15 mg via INTRAVENOUS
  Filled 2022-07-21: qty 1

## 2022-07-21 NOTE — Discharge Instructions (Signed)
You are likely having symptoms of an atypical migraine.  I recommend that you follow-up with your primary care provider for recheck.  Return to the emergency department for any new or worsening symptoms.

## 2022-07-21 NOTE — ED Provider Notes (Signed)
[facility] Provider Note   CSN: [id number] Arrival date & time: 07/21/22  1013     History  Chief Complaint  Patient presents with   Headache    Erin Sexton is a 51 y.o. female.   Headache Associated symptoms: no abdominal pain, no cough, no diarrhea, no dizziness, no eye pain, no fever, no neck pain, no neck stiffness, no numbness, no vomiting and no weakness        [name] is a 51 y.o. female with past medical history of hypertension, asthma, type 2 diabetes, prior TIA, CHF, COPD and ocular migraines who presents to the Emergency Department complaining of gradual onset of headache yesterday.  She describes the headache as a throbbing sensation to her entire head with sharp, stabbing type pains behind her left ear.  She had similar headache last month that lasted 1 week before spontaneously resolving.  Current headache feels similar.  She describes history of ocular migraines without pain and migraines in her 40s, but states current headache does not feel similar to prior migraines and she does not have pain associated with her ocular migraines.  Reports having a brief episode of vertigo yesterday and closed her eyes and felt as though the left side of her face "was sliding down."  She states this episode was brief lasting only a few minutes and immediately resolved when she opened her eyes.  No other facial or extremity symptoms since that episode.  She denies any difficulties with speech, photophobia, neck pain, dizziness, nausea or vomiting.     Home Medications Prior to Admission medications   Medication Sig Start Date End Date Taking? Authorizing Provider  Accu-Chek Softclix Lancets lancets USE 1 LANCET TO CHECK GLUCOSE ONCE DAILY 08/08/21   [name], NP  albuterol (PROVENTIL) (5 MG/ML) 0.5% nebulizer solution Take 0.5 mLs (2.5 mg total) by nebulization every 6 (six) hours as needed for wheezing or shortness of breath. 06/27/20   Ailene Ards, NP  albuterol (VENTOLIN HFA) 108 (90 Base) MCG/ACT inhaler Inhale 2 puffs into the lungs as needed (wheezing).     [provider]  atorvastatin (LIPITOR) 80 MG tablet Take 1 tablet (80 mg total) by mouth daily. 02/06/22   Lindell Spar, MD  blood glucose meter kit and supplies KIT Dispense based on patient and insurance preference. Use once a day. Diagnosis code:E11.9 10/26/19   Hurshel Party C, MD  buPROPion (WELLBUTRIN XL) 300 MG 24 hr tablet Take 1 tablet (300 mg total) by mouth daily. 06/13/22   Lindell Spar, MD  Cholecalciferol (VITAMIN D3 PO) Take 10,000 Units by mouth daily.    [provider]  clindamycin (CLEOCIN T) 1 % lotion  07/22/20   [provider]  clotrimazole-betamethasone (LOTRISONE) cream APPLY CREAM TOPICALLY TWICE DAILY 06/24/22   Lindell Spar, MD  Dulaglutide (TRULICITY) 3 IR/5.1OA SOPN Inject 3 mg as directed once a week. 06/13/22   Lindell Spar, MD  estradiol (ESTRACE) 2 MG tablet Take 1 tablet (2 mg total) by mouth at bedtime. 12/24/21   Florian Buff, MD  glucose blood (ACCU-CHEK GUIDE) test strip USE 1 STRIP TO CHECK GLUCOSE ONCE DAILY 07/19/22   Lindell Spar, MD  JARDIANCE 10 MG TABS tablet Take 1 tablet (10 mg total) by mouth daily. 06/13/22   Lindell Spar, MD  Lancet Device MISC Use to check your blood sugar daily 07/19/22   Lindell Spar, MD  levocetirizine Harlow Ohms)  5 MG tablet Take 1 tablet (5 mg total) by mouth every evening. 06/13/22   Lindell Spar, MD  levonorgestrel (MIRENA) 20 MCG/24HR IUD 1 each by Intrauterine route once. 04/03/16   [provider]  levothyroxine (SYNTHROID) 150 MCG tablet Take 1 tablet (150 mcg total) by mouth daily. 08/02/21   [name], NP  lisinopril (ZESTRIL) 2.5 MG tablet Take 1 tablet (2.5 mg total) by mouth daily. 06/13/22   Lindell Spar, MD  Magnesium 200 MG TABS Take 1 tablet by mouth as needed.    [provider]  megestrol (MEGACE) 40 MG tablet Take 1 tablet by  mouth once daily 11/04/21   Florian Buff, MD  metFORMIN (GLUCOPHAGE) 500 MG tablet Take 1 tablet (500 mg total) by mouth 2 (two) times daily with a meal. 06/13/22   Lindell Spar, MD  metoprolol tartrate (LOPRESSOR) 50 MG tablet Take 1 tablet (50 mg total) by mouth 2 (two) times daily. 06/13/22   Lindell Spar, MD  potassium chloride SA (KLOR-CON M) 20 MEQ tablet Take 1 tablet (20 mEq total) by mouth as needed (with lasix). 11/06/21   Donato Heinz, MD  SYMBICORT 160-4.5 MCG/ACT inhaler Inhale 2 puffs into the lungs as needed. 08/10/21   [name], NP  torsemide (DEMADEX) 20 MG tablet Take 1 tablet (20 mg total) by mouth as needed. 11/06/21   Donato Heinz, MD      Allergies    Patient has no known allergies.    Review of Systems   Review of Systems  Constitutional:  Negative for chills and fever.  Eyes:  Negative for pain and visual disturbance.  Respiratory:  Negative for cough, chest tightness and shortness of breath.   Gastrointestinal:  Negative for abdominal pain, diarrhea and vomiting.  Musculoskeletal:  Negative for neck pain and neck stiffness.  Neurological:  Positive for headaches. Negative for dizziness, syncope, facial asymmetry, speech difficulty, weakness and numbness.  Psychiatric/Behavioral:  Negative for confusion.     Physical Exam Updated Vital Signs BP (!) 164/90 (BP Location: Right Arm)   Pulse 80   Temp 98 F (36.7 C) (Oral)   Resp 18   Ht $R'5\' 9"'LB$  (1.753 m)   Wt 132.5 kg   SpO2 98%   BMI 43.12 kg/m  Physical Exam Vitals and nursing note reviewed.  Constitutional:      General: She is not in acute distress.    Appearance: She is well-developed. She is not toxic-appearing.  HENT:     Right Ear: Tympanic membrane and ear canal normal.     Left Ear: Tympanic membrane and ear canal normal.     Mouth/Throat:     Mouth: Mucous membranes are moist.  Eyes:     Extraocular Movements: Extraocular movements intact.      Conjunctiva/sclera: Conjunctivae normal.     Pupils: Pupils are equal, round, and reactive to light.  Neck:     Trachea: Phonation normal.     Meningeal: Kernig's sign absent.  Cardiovascular:     Rate and Rhythm: Normal rate and regular rhythm.     Pulses: Normal pulses.  Pulmonary:     Effort: Pulmonary effort is normal.     Breath sounds: Normal breath sounds.  Abdominal:     Palpations: Abdomen is soft.     Tenderness: There is no abdominal tenderness.  Musculoskeletal:        General: Normal range of motion.     Cervical back:  Full passive range of motion without pain and normal range of motion. No tenderness.     Right lower leg: No edema.     Left lower leg: No edema.  Lymphadenopathy:     Cervical: No cervical adenopathy.  Skin:    General: Skin is warm.     Capillary Refill: Capillary refill takes less than 2 seconds.     Findings: No erythema or rash.  Neurological:     General: No focal deficit present.     Mental Status: She is alert.     GCS: GCS eye subscore is 4. GCS verbal subscore is 5. GCS motor subscore is 6.     Sensory: Sensation is intact.     Motor: Motor function is intact. No abnormal muscle tone or pronator drift.     Coordination: Coordination is intact. Coordination normal. Finger-Nose-Finger Test normal.     Comments: CN II through XII intact.  Speech clear.  No facial droop or weakness on my exam, no pronator drift, 5 out of 5 motor strength of the bilateral upper and lower extremities.     ED Results / Procedures / Treatments   Labs (all labs ordered are listed, but only abnormal results are displayed) Labs Reviewed  CBC WITH DIFFERENTIAL/PLATELET - Abnormal; Notable for the following components:      Result Value   RBC 5.16 (*)    Hemoglobin 16.1 (*)    HCT 47.0 (*)    All other components within normal limits  BASIC METABOLIC PANEL - Abnormal; Notable for the following components:   Chloride 112 (*)    Glucose, Bld 107 (*)     Creatinine, Ser 1.18 (*)    Calcium 8.5 (*)    GFR, Estimated 55 (*)    All other components within normal limits  PROTIME-INR    EKG None  Radiology CT Head Wo Contrast  Result Date: 07/21/2022 CLINICAL DATA:  Headache, new or worsening (Age >= 50y) EXAM: CT HEAD WITHOUT CONTRAST TECHNIQUE: Contiguous axial images were obtained from the base of the skull through the vertex without intravenous contrast. RADIATION DOSE REDUCTION: This exam was performed according to the departmental dose-optimization program which includes automated exposure control, adjustment of the mA and/or kV according to patient size and/or use of iterative reconstruction technique. COMPARISON:  06/20/2016 FINDINGS: Brain: No evidence of acute infarction, hemorrhage, hydrocephalus, extra-axial collection or mass lesion/mass effect. Vascular: No hyperdense vessel or unexpected calcification. Skull: Normal. Negative for fracture or focal lesion. Sinuses/Orbits: No acute finding. Other: None. IMPRESSION: No acute intracranial abnormality. Electronically Signed   By: Davina Poke D.O.   On: 07/21/2022 11:44    Procedures Procedures    Medications Ordered in ED Medications - No data to display  ED Course/ Medical Decision Making/ A&P                           Medical Decision Making Patient here with prior history of childhood migraine headaches and continues to have ocular headaches without pain.  Here today with gradual onset of headache yesterday headache has been associated with a sharp stabbing type pain behind her left ear.  No neck pain or stiffness, fever or chills.  She brief episode of vertigo yesterday when she closed her eyes, she reports "feeling like my left face was sliding down" the symptom quickly resolved after she opened her eyes.  Taken multiple over-the-counter medications for her headache without relief.  On exam,  patient well-appearing nontoxic.  Vital signs reviewed.  I do not appreciate any  focal neurodeficits on her exam, she does not have any meningeal signs.  Differential would include but not limited to atypical migraine, meningitis, subarachnoid, TIA Clinically I suspect this is related to an atypical migraine given her history, will obtain labs and CT of the head.  If CT reassuring, will address with migraine cocktail.  Amount and/or Complexity of Data Reviewed Labs: ordered.    Details: Labs interpreted by me, no evidence of leukocytosis.  Chemistry she has slightly elevated serum creatinine that is improved from previous.  She does have a history of CKD. Radiology: ordered.    Details: CT head without acute intracranial finding. Discussion of management or test interpretation with external provider(s): Patient was given migraine cocktail, CT head reassuring. On recheck, patien resting comfortably.  States her headache has resolved and she is ready for discharge home.  She is ambulatory in the department with a steady gait.  She has a local PCP.  Recommend close outpatient follow-up.  Return precautions were discussed.  Risk Prescription drug management.           Final Clinical Impression(s) / ED Diagnoses Final diagnoses:  Headache disorder    Rx / DC Orders ED Discharge Orders     None         Kem Parkinson, Hershal Coria 07/21/22 1535    Cristie Hem, MD 07/21/22 1557

## 2022-07-21 NOTE — ED Triage Notes (Signed)
Pt presents to ED with complaints of headache on left lower back of head started yesterday.

## 2022-07-23 ENCOUNTER — Encounter: Payer: Self-pay | Admitting: Internal Medicine

## 2022-07-23 ENCOUNTER — Ambulatory Visit (INDEPENDENT_AMBULATORY_CARE_PROVIDER_SITE_OTHER): Payer: Medicare Other | Admitting: Internal Medicine

## 2022-07-23 DIAGNOSIS — G43119 Migraine with aura, intractable, without status migrainosus: Secondary | ICD-10-CM

## 2022-07-23 MED ORDER — BUTALBITAL-APAP-CAFFEINE 50-325-40 MG PO TABS: 1.0000 | ORAL_TABLET | Freq: Four times a day (QID) | ORAL | 0 refills | Status: DC | PRN

## 2022-07-23 NOTE — Patient Instructions (Signed)
Please take Fioricet as needed for severe headache.

## 2022-07-23 NOTE — Assessment & Plan Note (Signed)
Has had ER visits for atypical migraine ER visit notes reviewed Not candidate for triptan due to history of TIA and CHF Fioricet as needed Referred to neurology -may need maintenance treatment for migraine

## 2022-07-23 NOTE — Telephone Encounter (Signed)
Pt made a virtual visit 07-23-22

## 2022-07-23 NOTE — Progress Notes (Signed)
Virtual Visit via Telephone Note   This visit type was conducted due to national recommendations for restrictions regarding the COVID-19 Pandemic (e.g. social distancing) in an effort to limit this patient's exposure and mitigate transmission in our community.  Due to her co-morbid illnesses, this patient is at least at moderate risk for complications without adequate follow up.  This format is felt to be most appropriate for this patient at this time.  The patient did not have access to video technology/had technical difficulties with video requiring transitioning to audio format only (telephone).  All issues noted in this document were discussed and addressed.  No physical exam could be performed with this format.  Evaluation Performed:  Follow-up visit  Date:  07/23/2022   ID:  Erin Sexton, DOB 08-Nov-1971, MRN [id number]  Patient Location: Home Provider Location: Office/Clinic  Participants: Patient Location of Patient: Home Location of Provider: Telehealth Consent was obtain for visit to be over via telehealth. I verified that I am speaking with the correct person using two identifiers.  PCP:  Lindell Spar, MD   Chief Complaint: Headache  History of Present Illness:    Erin Sexton is a 51 y.o. female who has a televisit for c/o episodes of headache for the last few months.  She had 2 ER visits recently for headache.  Last episode was 2 days ago, where she had throbbing pain around her left ear for a day, associated with dizziness and mild blurry vision.  She did not have any focal neurologic deficit on physical exam in the ER. CT of the head was unremarkable.  She was thought to have atypical migraine, and was given migraine cocktail, which improved her headache.  Of note, she has history of ocular migraine in the past, but states that her symptoms were different at that time.  She denies any nausea or vomiting.  The patient does not have symptoms concerning for  COVID-19 infection (fever, chills, cough, or new shortness of breath).   Past Medical, Surgical, Social History, Allergies, and Medications have been Reviewed.  Past Medical History:  Diagnosis Date   Abnormal CT scan    a. 02/2014: evaluated for dyspnea and initially placed on Xarelto for PE because radiologist could not exclude small peripheral pulmonary emboli because of limited contrast. However, after admission for ++vaginal bleeding several days later, it was felt she did NOT have PE - underwent further testing with neg VQ 03/06/14, neg LE duplex.   Anemia    Anxiety    Arthritis    Asthma    Blood transfusion without reported diagnosis    Bone spur of right foot    CHF (congestive heart failure) (H[facility])    COPD (chronic obstructive pulmonary disease) ([place])    Depression    Diabetes mellitus without complication ([place])    a. Dx 02/2014.   Heart murmur    History of echocardiogram 2015   normal EF, Grade II diastolic dysfunction   HOCM (hypertrophic obstructive cardiomyopathy) ([place])    a. Dx 39532.   Hyperlipidemia    Hypertension    Morbid obesity ([place])    NSVT (nonsustained ventricular tachycardia) ([place])    a. Noted on tele during 02/2014 adm for symptomatic anemia.   Stroke [facility])    SVT (supraventricular tachycardia) ([place])    a. Noted on tele during 02/2014 adm for symptomatic anemia.   Thyroid disease    Vaginal bleeding    Past Surgical History:  Procedure Laterality  Date   COLONOSCOPY N/A 09/30/2019   Procedure: COLONOSCOPY;  Surgeon: Rogene Houston, MD;  Location: [facility] ENDO SUITE;  Service: Endoscopy;  Laterality: N/A;  [place] N/A 08/07/2016   Procedure: DILATATION & CURETTAGE/HYSTEROSCOPY WITH ATTPEMPTED NOVASURE ABLATION AND INSERTION OF IUD;  Surgeon: Florian Buff, MD;  Location: [facility] ORS;  Service: Gynecology;  Laterality: N/A;     Current Meds  Medication Sig   Accu-Chek Softclix Lancets lancets USE 1  LANCET TO CHECK GLUCOSE ONCE DAILY   albuterol (PROVENTIL) (5 MG/ML) 0.5% nebulizer solution Take 0.5 mLs (2.5 mg total) by nebulization every 6 (six) hours as needed for wheezing or shortness of breath.   albuterol (VENTOLIN HFA) 108 (90 Base) MCG/ACT inhaler Inhale 2 puffs into the lungs as needed (wheezing).    atorvastatin (LIPITOR) 80 MG tablet Take 1 tablet (80 mg total) by mouth daily.   blood glucose meter kit and supplies KIT Dispense based on patient and insurance preference. Use once a day. Diagnosis code:E11.9   buPROPion (WELLBUTRIN XL) 300 MG 24 hr tablet Take 1 tablet (300 mg total) by mouth daily.   butalbital-acetaminophen-caffeine (FIORICET) 50-325-40 MG tablet Take 1 tablet by mouth every 6 (six) hours as needed for headache or migraine.   Cholecalciferol (VITAMIN D3 PO) Take 10,000 Units by mouth daily.   clindamycin (CLEOCIN T) 1 % lotion    clotrimazole-betamethasone (LOTRISONE) cream APPLY CREAM TOPICALLY TWICE DAILY   Dulaglutide (TRULICITY) 3 GL/8.7FI SOPN Inject 3 mg as directed once a week.   estradiol (ESTRACE) 2 MG tablet Take 1 tablet (2 mg total) by mouth at bedtime.   glucose blood (ACCU-CHEK GUIDE) test strip USE 1 STRIP TO CHECK GLUCOSE ONCE DAILY   JARDIANCE 10 MG TABS tablet Take 1 tablet (10 mg total) by mouth daily.   Lancet Device MISC Use to check your blood sugar daily   levocetirizine (XYZAL) 5 MG tablet Take 1 tablet (5 mg total) by mouth every evening.   levonorgestrel (MIRENA) 20 MCG/24HR IUD 1 each by Intrauterine route once.   levothyroxine (SYNTHROID) 150 MCG tablet Take 1 tablet (150 mcg total) by mouth daily.   lisinopril (ZESTRIL) 2.5 MG tablet Take 1 tablet (2.5 mg total) by mouth daily.   Magnesium 200 MG TABS Take 1 tablet by mouth as needed.   megestrol (MEGACE) 40 MG tablet Take 1 tablet by mouth once daily   metFORMIN (GLUCOPHAGE) 500 MG tablet Take 1 tablet (500 mg total) by mouth 2 (two) times daily with a meal.   metoprolol tartrate  (LOPRESSOR) 50 MG tablet Take 1 tablet (50 mg total) by mouth 2 (two) times daily.   potassium chloride SA (KLOR-CON M) 20 MEQ tablet Take 1 tablet (20 mEq total) by mouth as needed (with lasix).   SYMBICORT 160-4.5 MCG/ACT inhaler Inhale 2 puffs into the lungs as needed.   torsemide (DEMADEX) 20 MG tablet Take 1 tablet (20 mg total) by mouth as needed.     Allergies:   Patient has no known allergies.   ROS:   Please see the history of present illness.     All other systems reviewed and are negative.   Labs/Other Tests and Data Reviewed:    Recent Labs: 10/26/2021: Magnesium 2.2 02/12/2022: ALT 18 06/13/2022: TSH 1.720 07/21/2022: BUN 15; Creatinine, Ser 1.18; Hemoglobin 16.1; Platelets 223; Potassium 3.8; Sodium 141   Recent Lipid Panel Lab Results  Component Value Date/Time   CHOL 106 02/12/2022 03:12  PM   TRIG 155 (H) 02/12/2022 03:12 PM   HDL 33 (L) 02/12/2022 03:12 PM   CHOLHDL 3.2 02/12/2022 03:12 PM   CHOLHDL 2.7 06/27/2020 01:55 PM   LDLCALC 46 02/12/2022 03:12 PM   LDLCALC 21 06/27/2020 01:55 PM    Wt Readings from Last 3 Encounters:  07/21/22 292 lb (132.5 kg)  06/13/22 292 lb (132.5 kg)  05/16/22 292 lb 6.4 oz (132.6 kg)      ASSESSMENT & PLAN:    Intractable migraine with aura without status migrainosus Has had ER visits for atypical migraine ER visit notes reviewed Not candidate for triptan due to history of TIA and CHF Fioricet as needed Referred to neurology -may need maintenance treatment for migraine     Time:   Today, I have spent 17 minutes reviewing the chart, including problem list, medications, and with the patient with telehealth technology discussing the above problems.   Medication Adjustments/Labs and Tests Ordered: Current medicines are reviewed at length with the patient today.  Concerns regarding medicines are outlined above.   Tests Ordered: Orders Placed This Encounter  Procedures   Ambulatory referral to Neurology     Medication Changes: Meds ordered this encounter  Medications   butalbital-acetaminophen-caffeine (FIORICET) 50-325-40 MG tablet    Sig: Take 1 tablet by mouth every 6 (six) hours as needed for headache or migraine.    Dispense:  14 tablet    Refill:  0     Note: This dictation was prepared with Dragon dictation along with smaller phrase technology. Similar sounding words can be transcribed inadequately or may not be corrected upon review. Any transcriptional errors that result from this process are unintentional.      Disposition:  Follow up  Signed, Lindell Spar, MD  07/23/2022 12:09 PM     [place]

## 2022-08-03 ENCOUNTER — Other Ambulatory Visit: Payer: Self-pay

## 2022-08-03 ENCOUNTER — Emergency Department (HOSPITAL_COMMUNITY)
Admission: EM | Admit: 2022-08-03 | Discharge: 2022-08-03 | Disposition: A | Discharge status: Home / Self Care | Payer: Medicare Other | Attending: Emergency Medicine | Admitting: Emergency Medicine

## 2022-08-03 ENCOUNTER — Encounter (HOSPITAL_COMMUNITY): Payer: Self-pay | Admitting: Emergency Medicine

## 2022-08-03 DIAGNOSIS — J449 Chronic obstructive pulmonary disease, unspecified: Secondary | ICD-10-CM | POA: Insufficient documentation

## 2022-08-03 DIAGNOSIS — J029 Acute pharyngitis, unspecified: Secondary | ICD-10-CM | POA: Diagnosis not present

## 2022-08-03 DIAGNOSIS — E119 Type 2 diabetes mellitus without complications: Secondary | ICD-10-CM | POA: Diagnosis not present

## 2022-08-03 DIAGNOSIS — I509 Heart failure, unspecified: Secondary | ICD-10-CM | POA: Insufficient documentation

## 2022-08-03 DIAGNOSIS — I110 Hypertensive heart disease with heart failure: Secondary | ICD-10-CM | POA: Insufficient documentation

## 2022-08-03 DIAGNOSIS — J45909 Unspecified asthma, uncomplicated: Secondary | ICD-10-CM | POA: Diagnosis not present

## 2022-08-03 DIAGNOSIS — R590 Localized enlarged lymph nodes: Secondary | ICD-10-CM | POA: Insufficient documentation

## 2022-08-03 DIAGNOSIS — Z79899 Other long term (current) drug therapy: Secondary | ICD-10-CM | POA: Insufficient documentation

## 2022-08-03 DIAGNOSIS — Z7984 Long term (current) use of oral hypoglycemic drugs: Secondary | ICD-10-CM | POA: Diagnosis not present

## 2022-08-03 DIAGNOSIS — Z20822 Contact with and (suspected) exposure to covid-19: Secondary | ICD-10-CM | POA: Insufficient documentation

## 2022-08-03 LAB — GROUP A STREP BY PCR: Group A Strep by PCR: NOT DETECTED

## 2022-08-03 LAB — SARS CORONAVIRUS 2 BY RT PCR: SARS Coronavirus 2 by RT PCR: NEGATIVE

## 2022-08-03 MED ORDER — DEXAMETHASONE SODIUM PHOSPHATE 10 MG/ML IJ SOLN
10.0000 mg | Freq: Once | INTRAMUSCULAR | Status: AC
Start: 2022-08-03 — End: 2022-08-03
  Administered 2022-08-03: 10 mg via INTRAMUSCULAR
  Filled 2022-08-03: qty 1

## 2022-08-03 MED ORDER — HYDROCODONE-ACETAMINOPHEN 7.5-325 MG/15ML PO SOLN
10.0000 mL | Freq: Once | ORAL | Status: AC
  Administered 2022-08-03: 10 mL via ORAL
  Filled 2022-08-03: qty 15

## 2022-08-03 NOTE — Discharge Instructions (Signed)
You were seen today for sore throat.  Your work-up was reassuring.  Your COVID and strep testing are negative.  This is likely another virus.  You may gargle warm salt water.  Take Tylenol or ibuprofen as needed for pain.

## 2022-08-03 NOTE — ED Triage Notes (Signed)
Pt with c/o sore throat x 1 day. States it "hurts to swallow and feels like her throat is closing". States she thinks her "glands are swollen".

## 2022-08-03 NOTE — ED Provider Notes (Signed)
[facility] EMERGENCY DEPARTMENT Provider Note   CSN: [id number] Arrival date & time: 08/03/22  0234     History  Chief Complaint  Patient presents with   Sore Throat    Erin Sexton is a 51 y.o. female.  HPI     This is a 51 year old female who presents with concerns for sore throat.  Patient reports 1 day history of worsening sore throat.  She states it sore when she swallows.  She has had some nasal congestion.  No cough or fevers.  She states that she feels like her glands are swollen.  She has taken over the counter NyQuil medications with minimal relief.  Home Medications Prior to Admission medications   Medication Sig Start Date End Date Taking? Authorizing Provider  Accu-Chek Softclix Lancets lancets USE 1 LANCET TO CHECK GLUCOSE ONCE DAILY 08/08/21   [name], NP  albuterol (PROVENTIL) (5 MG/ML) 0.5% nebulizer solution Take 0.5 mLs (2.5 mg total) by nebulization every 6 (six) hours as needed for wheezing or shortness of breath. 06/27/20   Ailene Ards, NP  albuterol (VENTOLIN HFA) 108 (90 Base) MCG/ACT inhaler Inhale 2 puffs into the lungs as needed (wheezing).     [provider]  atorvastatin (LIPITOR) 80 MG tablet Take 1 tablet (80 mg total) by mouth daily. 02/06/22   Lindell Spar, MD  blood glucose meter kit and supplies KIT Dispense based on patient and insurance preference. Use once a day. Diagnosis code:E11.9 10/26/19   Hurshel Party C, MD  buPROPion (WELLBUTRIN XL) 300 MG 24 hr tablet Take 1 tablet (300 mg total) by mouth daily. 06/13/22   Lindell Spar, MD  butalbital-acetaminophen-caffeine (FIORICET) [phone] MG tablet Take 1 tablet by mouth every 6 (six) hours as needed for headache or migraine. 07/23/22   Lindell Spar, MD  Cholecalciferol (VITAMIN D3 PO) Take 10,000 Units by mouth daily.    [provider]  clindamycin (CLEOCIN T) 1 % lotion  07/22/20   [provider]  clotrimazole-betamethasone (LOTRISONE) cream  APPLY CREAM TOPICALLY TWICE DAILY 06/24/22   Lindell Spar, MD  Dulaglutide (TRULICITY) 3 QH/4.7ML SOPN Inject 3 mg as directed once a week. 06/13/22   Lindell Spar, MD  estradiol (ESTRACE) 2 MG tablet Take 1 tablet (2 mg total) by mouth at bedtime. 12/24/21   Florian Buff, MD  glucose blood (ACCU-CHEK GUIDE) test strip USE 1 STRIP TO CHECK GLUCOSE ONCE DAILY 07/19/22   Lindell Spar, MD  JARDIANCE 10 MG TABS tablet Take 1 tablet (10 mg total) by mouth daily. 06/13/22   Lindell Spar, MD  Lancet Device MISC Use to check your blood sugar daily 07/19/22   Lindell Spar, MD  levocetirizine (XYZAL) 5 MG tablet Take 1 tablet (5 mg total) by mouth every evening. 06/13/22   Lindell Spar, MD  levonorgestrel (MIRENA) 20 MCG/24HR IUD 1 each by Intrauterine route once. 04/03/16   [provider]  levothyroxine (SYNTHROID) 150 MCG tablet Take 1 tablet (150 mcg total) by mouth daily. 08/02/21   [name], NP  lisinopril (ZESTRIL) 2.5 MG tablet Take 1 tablet (2.5 mg total) by mouth daily. 06/13/22   Lindell Spar, MD  Magnesium 200 MG TABS Take 1 tablet by mouth as needed.    [provider]  megestrol (MEGACE) 40 MG tablet Take 1 tablet by mouth once daily 11/04/21   Florian Buff, MD  metFORMIN (GLUCOPHAGE) 500 MG tablet Take  1 tablet (500 mg total) by mouth 2 (two) times daily with a meal. 06/13/22   Posey Pronto, Colin Broach, MD  metoprolol tartrate (LOPRESSOR) 50 MG tablet Take 1 tablet (50 mg total) by mouth 2 (two) times daily. 06/13/22   Lindell Spar, MD  potassium chloride SA (KLOR-CON M) 20 MEQ tablet Take 1 tablet (20 mEq total) by mouth as needed (with lasix). 11/06/21   Donato Heinz, MD  SYMBICORT 160-4.5 MCG/ACT inhaler Inhale 2 puffs into the lungs as needed. 08/10/21   [name], NP  torsemide (DEMADEX) 20 MG tablet Take 1 tablet (20 mg total) by mouth as needed. 11/06/21   Donato Heinz, MD      Allergies    Patient has no known allergies.    Review  of Systems   Review of Systems  Constitutional:  Negative for fever.  HENT:  Positive for congestion and sore throat.   Respiratory:  Negative for cough and shortness of breath.   Cardiovascular:  Negative for chest pain.  All other systems reviewed and are negative.   Physical Exam Updated Vital Signs BP (!) 182/101   Pulse 79   Temp 97.9 F (36.6 C) (Oral)   Resp 18   Ht 1.753 m (5' 9")   Wt 132 kg   SpO2 99%   BMI 42.97 kg/m  Physical Exam Vitals and nursing note reviewed.  Constitutional:      Appearance: She is well-developed. She is obese. She is not ill-appearing.  HENT:     Head: Normocephalic and atraumatic.     Mouth/Throat:     Mouth: Mucous membranes are moist.     Comments: Bilateral tonsillar enlargement, no exudate, uvula midline Eyes:     Pupils: Pupils are equal, round, and reactive to light.  Cardiovascular:     Rate and Rhythm: Normal rate and regular rhythm.     Heart sounds: Normal heart sounds.  Pulmonary:     Effort: Pulmonary effort is normal. No respiratory distress.     Breath sounds: No wheezing.  Abdominal:     General: Bowel sounds are normal.     Palpations: Abdomen is soft.  Musculoskeletal:     Cervical back: Neck supple.  Lymphadenopathy:     Cervical: Cervical adenopathy present.  Skin:    General: Skin is warm and dry.  Neurological:     Mental Status: She is alert and oriented to person, place, and time.  Psychiatric:        Mood and Affect: Mood normal.     ED Results / Procedures / Treatments   Labs (all labs ordered are listed, but only abnormal results are displayed) Labs Reviewed  GROUP A STREP BY PCR  SARS CORONAVIRUS 2 BY RT PCR    EKG None  Radiology No results found.  Procedures Procedures    Medications Ordered in ED Medications  dexamethasone (DECADRON) injection 10 mg (10 mg Intramuscular Given 08/03/22 0503)  HYDROcodone-acetaminophen (HYCET) 7.5-325 mg/15 ml solution 10 mL (10 mLs Oral Given  08/03/22 0501)    ED Course/ Medical Decision Making/ A&P                           Medical Decision Making Risk Prescription drug management.   This patient presents to the ED for concern of sore throat, this involves an extensive number of treatment options, and is a complaint that carries with it a high risk of complications  and morbidity.  I considered the following differential and admission for this acute, potentially life threatening condition.  The differential diagnosis includes pharyngitis, viral pharyngitis, deep space infection such as peritonsillar abscess, postnasal drip  MDM:    This is a 51 year old female who presents with concerns for sore throat.  She is nontoxic and vital signs are reassuring.  Airway is intact.  She has bilateral tonsillar swelling but no evidence of deep space infection.  No exudate and have lower suspicion for strep pharyngitis.  Would have higher suspicion for viral etiology or allergic etiology.  COVID testing is negative.  Strep testing is also negative.  Patient was given a dose of Decadron.  Recommend supportive measures at home.  (Labs, imaging, consults)  Labs: I Ordered, and personally interpreted labs.  The pertinent results include: Negative COVID and strep testing  Imaging Studies ordered: I ordered imaging studies including none I independently visualized and interpreted imaging. I agree with the radiologist interpretation  Additional history obtained from chart review.  External records from outside source obtained and reviewed including prior evaluations  Cardiac Monitoring: The patient was maintained on a cardiac monitor.  I personally viewed and interpreted the cardiac monitored which showed an underlying rhythm of: Sinus rhythm  Reevaluation: After the interventions noted above, I reevaluated the patient and found that they have :stayed the same  Social Determinants of Health: Lives independently  Disposition:  Discharge  Co morbidities that complicate the patient evaluation  Past Medical History:  Diagnosis Date   Abnormal CT scan    a. 02/2014: evaluated for dyspnea and initially placed on Xarelto for PE because radiologist could not exclude small peripheral pulmonary emboli because of limited contrast. However, after admission for ++vaginal bleeding several days later, it was felt she did NOT have PE - underwent further testing with neg VQ 03/06/14, neg LE duplex.   Anemia    Anxiety    Arthritis    Asthma    Blood transfusion without reported diagnosis    Bone spur of right foot    CHF (congestive heart failure) (HCC)    COPD (chronic obstructive pulmonary disease) ([place])    Depression    Diabetes mellitus without complication ([place])    a. Dx 02/2014.   Heart murmur    History of echocardiogram 2015   normal EF, Grade II diastolic dysfunction   HOCM (hypertrophic obstructive cardiomyopathy) ([place])    a. Dx 27062.   Hyperlipidemia    Hypertension    Morbid obesity ([place])    NSVT (nonsustained ventricular tachycardia) ([place])    a. Noted on tele during 02/2014 adm for symptomatic anemia.   Stroke [facility])    SVT (supraventricular tachycardia) ([place])    a. Noted on tele during 02/2014 adm for symptomatic anemia.   Thyroid disease    Vaginal bleeding      Medicines Meds ordered this encounter  Medications   dexamethasone (DECADRON) injection 10 mg   HYDROcodone-acetaminophen (HYCET) 7.5-325 mg/15 ml solution 10 mL    I have reviewed the patients home medicines and have made adjustments as needed  Problem List / ED Course: Problem List Items Addressed This Visit   None Visit Diagnoses     Pharyngitis, unspecified etiology    -  Primary                   Final Clinical Impression(s) / ED Diagnoses Final diagnoses:  Pharyngitis, unspecified etiology    Rx / DC Orders ED Discharge Orders  None         Merryl Hacker, MD 08/03/22 [id number]

## 2022-08-07 ENCOUNTER — Encounter: Payer: Self-pay | Admitting: Internal Medicine

## 2022-08-07 ENCOUNTER — Ambulatory Visit (INDEPENDENT_AMBULATORY_CARE_PROVIDER_SITE_OTHER): Payer: Medicare Other | Admitting: Internal Medicine

## 2022-08-07 DIAGNOSIS — J029 Acute pharyngitis, unspecified: Secondary | ICD-10-CM

## 2022-08-07 MED ORDER — LIDOCAINE VISCOUS HCL 2 % MT SOLN: 15.0000 mL | OROMUCOSAL | 0 refills | Status: DC | PRN

## 2022-08-07 MED ORDER — LIDOCAINE VISCOUS HCL 2 % MT SOLN: 5.0000 mL | Freq: Three times a day (TID) | OROMUCOSAL | 0 refills | Status: DC | PRN

## 2022-08-07 MED ORDER — AMOXICILLIN-POT CLAVULANATE 875-125 MG PO TABS: 1.0000 | ORAL_TABLET | Freq: Two times a day (BID) | ORAL | 0 refills | Status: DC

## 2022-08-07 NOTE — Progress Notes (Signed)
   Virtual Visit via Telephone Note   This visit type was conducted due to national recommendations for restrictions regarding the COVID-19 Pandemic (e.g. social distancing) in an effort to limit this patient's exposure and mitigate transmission in our community.  Due to her co-morbid illnesses, this patient is at least at moderate risk for complications without adequate follow up.  This format is felt to be most appropriate for this patient at this time.  The patient did not have access to video technology/had technical difficulties with video requiring transitioning to audio format only (telephone).  All issues noted in this document were discussed and addressed.  No physical exam could be performed with this format.  Evaluation Performed:  Follow-up visit  Date:  08/07/2022   ID:  Erin Sexton, DOB 11/01/1971, MRN [id number]  Patient Location: Home Provider Location: Office/Clinic  Participants: Patient Location of Patient: Home Location of Provider: Telehealth Consent was obtain for visit to be over via telehealth. I verified that I am speaking with the correct person using two identifiers.  PCP:  ,  K, MD   Chief Complaint: Cough and sore throat  History of Present Illness:    Erin Sexton is a 51 y.o. female who has a televisit for c/o persistent sore throat, fatigue, nasal congestion, postnasal drip and cough for the last 1 week.  She went to urgent care, where she had negative COVID test.  She was advised to continue symptomatic treatment.  She has tried warm water gargling, phenol throat spray/lozenges and Xyzal without much relief.  She has mild dyspnea due to her asthma, which is better with Symbicort and as needed albuterol.  Denies any fever or chills currently.  The patient does have symptoms concerning for COVID-19 infection (fever, chills, cough, or new shortness of breath).   Past Medical, Surgical, Social History, Allergies, and Medications have  been Reviewed.  Past Medical History:  Diagnosis Date   Abnormal CT scan    a. 02/2014: evaluated for dyspnea and initially placed on Xarelto for PE because radiologist could not exclude small peripheral pulmonary emboli because of limited contrast. However, after admission for ++vaginal bleeding several days later, it was felt she did NOT have PE - underwent further testing with neg VQ 03/06/14, neg LE duplex.   Anemia    Anxiety    Arthritis    Asthma    Blood transfusion without reported diagnosis    Bone spur of right foot    CHF (congestive heart failure) (HCC)    COPD (chronic obstructive pulmonary disease) (HCC)    Depression    Diabetes mellitus without complication (HCC)    a. Dx 02/2014.   Heart murmur    History of echocardiogram 2015   normal EF, Grade II diastolic dysfunction   HOCM (hypertrophic obstructive cardiomyopathy) (HCC)    a. Dx 32015.   Hyperlipidemia    Hypertension    Morbid obesity (HCC)    NSVT (nonsustained ventricular tachycardia) (HCC)    a. Noted on tele during 02/2014 adm for symptomatic anemia.   Stroke (HCC)    SVT (supraventricular tachycardia) (HCC)    a. Noted on tele during 02/2014 adm for symptomatic anemia.   Thyroid disease    Vaginal bleeding    Past Surgical History:  Procedure Laterality Date   COLONOSCOPY N/A 09/30/2019   Procedure: COLONOSCOPY;  Surgeon: Rehman, Najeeb U, MD;  Location: [facility] ENDO SUITE;  Service: Endoscopy;  Laterality: N/A;  730   DILITATION &   CURRETTAGE/HYSTROSCOPY WITH NOVASURE ABLATION N/A 08/07/2016   Procedure: DILATATION & CURETTAGE/HYSTEROSCOPY WITH ATTPEMPTED NOVASURE ABLATION AND INSERTION OF IUD;  Surgeon: Luther H Eure, MD;  Location: [facility] ORS;  Service: Gynecology;  Laterality: N/A;     Current Meds  Medication Sig   Accu-Chek Softclix Lancets lancets USE 1 LANCET TO CHECK GLUCOSE ONCE DAILY   albuterol (PROVENTIL) (5 MG/ML) 0.5% nebulizer solution Take 0.5 mLs (2.5 mg total) by nebulization every 6 (six)  hours as needed for wheezing or shortness of breath.   albuterol (VENTOLIN HFA) 108 (90 Base) MCG/ACT inhaler Inhale 2 puffs into the lungs as needed (wheezing).    atorvastatin (LIPITOR) 80 MG tablet Take 1 tablet (80 mg total) by mouth daily.   blood glucose meter kit and supplies KIT Dispense based on patient and insurance preference. Use once a day. Diagnosis code:E11.9   buPROPion (WELLBUTRIN XL) 300 MG 24 hr tablet Take 1 tablet (300 mg total) by mouth daily.   butalbital-acetaminophen-caffeine (FIORICET) 50-325-40 MG tablet Take 1 tablet by mouth every 6 (six) hours as needed for headache or migraine.   Cholecalciferol (VITAMIN D3 PO) Take 10,000 Units by mouth daily.   clindamycin (CLEOCIN T) 1 % lotion    clotrimazole-betamethasone (LOTRISONE) cream APPLY CREAM TOPICALLY TWICE DAILY   Dulaglutide (TRULICITY) 3 MG/0.5ML SOPN Inject 3 mg as directed once a week.   estradiol (ESTRACE) 2 MG tablet Take 1 tablet (2 mg total) by mouth at bedtime.   glucose blood (ACCU-CHEK GUIDE) test strip USE 1 STRIP TO CHECK GLUCOSE ONCE DAILY   JARDIANCE 10 MG TABS tablet Take 1 tablet (10 mg total) by mouth daily.   Lancet Device MISC Use to check your blood sugar daily   levocetirizine (XYZAL) 5 MG tablet Take 1 tablet (5 mg total) by mouth every evening.   levonorgestrel (MIRENA) 20 MCG/24HR IUD 1 each by Intrauterine route once.   levothyroxine (SYNTHROID) 150 MCG tablet Take 1 tablet (150 mcg total) by mouth daily.   lisinopril (ZESTRIL) 2.5 MG tablet Take 1 tablet (2.5 mg total) by mouth daily.   Magnesium 200 MG TABS Take 1 tablet by mouth as needed.   megestrol (MEGACE) 40 MG tablet Take 1 tablet by mouth once daily   metFORMIN (GLUCOPHAGE) 500 MG tablet Take 1 tablet (500 mg total) by mouth 2 (two) times daily with a meal.   metoprolol tartrate (LOPRESSOR) 50 MG tablet Take 1 tablet (50 mg total) by mouth 2 (two) times daily.   potassium chloride SA (KLOR-CON M) 20 MEQ tablet Take 1 tablet (20  mEq total) by mouth as needed (with lasix).   SYMBICORT 160-4.5 MCG/ACT inhaler Inhale 2 puffs into the lungs as needed.   torsemide (DEMADEX) 20 MG tablet Take 1 tablet (20 mg total) by mouth as needed.     Allergies:   Patient has no known allergies.   ROS:   Please see the history of present illness.     All other systems reviewed and are negative.   Labs/Other Tests and Data Reviewed:    Recent Labs: 10/26/2021: Magnesium 2.2 02/12/2022: ALT 18 06/13/2022: TSH 1.720 07/21/2022: BUN 15; Creatinine, Ser 1.18; Hemoglobin 16.1; Platelets 223; Potassium 3.8; Sodium 141   Recent Lipid Panel Lab Results  Component Value Date/Time   CHOL 106 02/12/2022 03:12 PM   TRIG 155 (H) 02/12/2022 03:12 PM   HDL 33 (L) 02/12/2022 03:12 PM   CHOLHDL 3.2 02/12/2022 03:12 PM   CHOLHDL 2.7 06/27/2020 01:55 PM     LDLCALC 46 02/12/2022 03:12 PM   LDLCALC 21 06/27/2020 01:55 PM    Wt Readings from Last 3 Encounters:  08/03/22 291 lb 0.1 oz (132 kg)  07/21/22 292 lb (132.5 kg)  06/13/22 292 lb (132.5 kg)     ASSESSMENT & PLAN:    Acute pharyngitis Persistent symptoms despite symptomatic treatment Started empiric Augmentin Physical exam in urgent care note shows enlarged tonsils, could be acute tonsillitis Continue warm water gargling Magic mouthwash as needed for sore throat  Time:   Today, I have spent 12 minutes reviewing the chart, including problem list, medications, and with the patient with telehealth technology discussing the above problems.   Medication Adjustments/Labs and Tests Ordered: Current medicines are reviewed at length with the patient today.  Concerns regarding medicines are outlined above.   Tests Ordered: No orders of the defined types were placed in this encounter.   Medication Changes: No orders of the defined types were placed in this encounter.    Note: This dictation was prepared with Dragon dictation along with smaller phrase technology. Similar sounding  words can be transcribed inadequately or may not be corrected upon review. Any transcriptional errors that result from this process are unintentional.      Disposition:  Follow up  Signed, Lindell Spar, MD  08/07/2022 11:08 AM     [place]

## 2022-08-07 NOTE — Addendum Note (Signed)
Addended byIhor Dow on: 08/07/2022 03:36 PM   Modules accepted: Orders

## 2022-08-09 ENCOUNTER — Other Ambulatory Visit: Payer: Self-pay

## 2022-08-09 DIAGNOSIS — E039 Hypothyroidism, unspecified: Secondary | ICD-10-CM

## 2022-08-09 MED ORDER — LEVOTHYROXINE SODIUM 150 MCG PO TABS: 150.0000 ug | ORAL_TABLET | Freq: Every day | ORAL | 3 refills | Status: DC

## 2022-08-14 ENCOUNTER — Ambulatory Visit (HOSPITAL_COMMUNITY): Payer: Medicare Other

## 2022-08-15 ENCOUNTER — Ambulatory Visit (HOSPITAL_COMMUNITY)
Admission: RE | Admit: 2022-08-15 | Discharge: 2022-08-15 | Disposition: A | Discharge status: Home / Self Care | Payer: Medicare Other | Source: Ambulatory Visit | Attending: Internal Medicine | Admitting: Internal Medicine

## 2022-08-15 DIAGNOSIS — Z1231 Encounter for screening mammogram for malignant neoplasm of breast: Secondary | ICD-10-CM | POA: Insufficient documentation

## 2022-08-27 NOTE — Progress Notes (Unsigned)
Referring:  Lindell Spar, MD [address] [place],  [place] [address]  PCP: Lindell Spar, MD  Neurology was asked to evaluate Erin Sexton, a 51 year old female for a chief complaint of headaches.  Our recommendations of care will be communicated by shared medical record.    CC:  headaches  History provided from self  HPI:  Medical co-morbidities: HTN, HOCM, TIA, asthma, DM2, hypothyroidism, OSA, HLD  The patient presents for evaluation of headaches which began in July 2023. She used to have migraines in her 47s, but they had improved for several years until recently. Headaches are associated with nausea and vomiting. She will have a visual aura prior to her migraines which will last for 10 minutes at a time. She is currently averaging one migraine every 3 weeks. Headaches can last 4 days to 1 week at a time.  She has tried Imitrex injections which have been ineffective. Excedrin will help take the edge off if she takes it early enough.  Has a history of TIA with left facial droop ~3 years ago.  Headache History: Onset: July 2023 Triggers: Aura: zigzags in vision, visual distortion (looking through a fishbowl) Location: left temple Associated Symptoms:  Photophobia: no  Phonophobia: no  Nausea: yes Vomiting: yes Worse with activity?: yes Duration of headaches: 4 days  Headache days per month: 7 Headache free days per month: 23  Current Treatment: Abortive Imitrex Excedrin  Preventative none  Prior Therapies                                 Fioricet Imitrex Triptans contraindicated due to TIA magnesium Lisinopril 2.5 mg daily Metoprolol 50 mg BID Cymbalta 60 mg daily Wellbutrin   LABS: CBC    Component Value Date/Time   WBC 10.2 07/21/2022 1142   RBC 5.16 (H) 07/21/2022 1142   HGB 16.1 (H) 07/21/2022 1142   HGB 16.5 (H) 08/01/2021 0933   HCT 47.0 (H) 07/21/2022 1142   HCT 46.9 (H) 08/01/2021 0933   PLT 223 07/21/2022 1142   PLT 277  08/01/2021 0933   MCV 91.1 07/21/2022 1142   MCV 89 08/01/2021 0933   MCH 31.2 07/21/2022 1142   MCHC 34.3 07/21/2022 1142   RDW 12.4 07/21/2022 1142   RDW 12.2 08/01/2021 0933   LYMPHSABS 2.1 07/21/2022 1142   LYMPHSABS 2.5 08/01/2021 0933   MONOABS 0.5 07/21/2022 1142   EOSABS 0.2 07/21/2022 1142   EOSABS 0.2 08/01/2021 0933   BASOSABS 0.1 07/21/2022 1142   BASOSABS 0.1 08/01/2021 0933      Latest Ref Rng & Units 07/21/2022   11:42 AM 06/13/2022    3:25 PM 02/12/2022    3:12 PM  CMP  Glucose 70 - 99 mg/dL 107  83  86   BUN 6 - 20 mg/dL $Remove'15  14  17   'jFQGGeH$ Creatinine 0.44 - 1.00 mg/dL 1.18  1.27  1.18   Sodium 135 - 145 mmol/L 141  143  144   Potassium 3.5 - 5.1 mmol/L 3.8  4.0  4.0   Chloride 98 - 111 mmol/L 112  105  111   CO2 22 - 32 mmol/L $RemoveB'22  19  17   'OgCOSoRF$ Calcium 8.9 - 10.3 mg/dL 8.5  9.2  9.1   Total Protein 6.0 - 8.5 g/dL   6.1   Total Bilirubin 0.0 - 1.2 mg/dL   0.5   Alkaline Phos 44 -  121 IU/L   95   AST 0 - 40 IU/L   15   ALT 0 - 32 IU/L   18      IMAGING:  CT[facility] 07/21/22: unremarkable  Imaging independently reviewed on August 28, 2022   Current Outpatient Medications on File Prior to Visit  Medication Sig Dispense Refill   Accu-Chek Softclix Lancets lancets USE 1 LANCET TO CHECK GLUCOSE ONCE DAILY 100 each 3   albuterol (PROVENTIL) (5 MG/ML) 0.5% nebulizer solution Take 0.5 mLs (2.5 mg total) by nebulization every 6 (six) hours as needed for wheezing or shortness of breath. 20 mL 0   albuterol (VENTOLIN HFA) 108 (90 Base) MCG/ACT inhaler Inhale 2 puffs into the lungs as needed (wheezing).      atorvastatin (LIPITOR) 80 MG tablet Take 1 tablet (80 mg total) by mouth daily. 90 tablet 3   blood glucose meter kit and supplies KIT Dispense based on patient and insurance preference. Use once a day. Diagnosis code:E11.9 1 each 0   buPROPion (WELLBUTRIN XL) 300 MG 24 hr tablet Take 1 tablet (300 mg total) by mouth daily. 90 tablet 3   butalbital-acetaminophen-caffeine  (FIORICET) 50-325-40 MG tablet Take 1 tablet by mouth every 6 (six) hours as needed for headache or migraine. 14 tablet 0   Cholecalciferol (VITAMIN D3 PO) Take 10,000 Units by mouth daily.     clindamycin (CLEOCIN T) 1 % lotion      clotrimazole-betamethasone (LOTRISONE) cream APPLY CREAM TOPICALLY TWICE DAILY 15 g 0   Dulaglutide (TRULICITY) 3 TK/2.4OX SOPN Inject 3 mg as directed once a week. 4 mL 3   estradiol (ESTRACE) 2 MG tablet Take 1 tablet (2 mg total) by mouth at bedtime. 90 tablet 3   glucose blood (ACCU-CHEK GUIDE) test strip USE 1 STRIP TO CHECK GLUCOSE ONCE DAILY 100 each 3   JARDIANCE 10 MG TABS tablet Take 1 tablet (10 mg total) by mouth daily. 90 tablet 3   Lancet Device MISC Use to check your blood sugar daily 100 each 5   levocetirizine (XYZAL) 5 MG tablet Take 1 tablet (5 mg total) by mouth every evening. 90 tablet 3   levonorgestrel (MIRENA) 20 MCG/24HR IUD 1 each by Intrauterine route once.     levothyroxine (SYNTHROID) 150 MCG tablet Take 1 tablet (150 mcg total) by mouth daily. 90 tablet 3   lisinopril (ZESTRIL) 2.5 MG tablet Take 1 tablet (2.5 mg total) by mouth daily. 90 tablet 3   Magnesium 200 MG TABS Take 1 tablet by mouth as needed.     megestrol (MEGACE) 40 MG tablet Take 1 tablet by mouth once daily 30 tablet 11   metFORMIN (GLUCOPHAGE) 500 MG tablet Take 1 tablet (500 mg total) by mouth 2 (two) times daily with a meal. 180 tablet 3   metoprolol tartrate (LOPRESSOR) 50 MG tablet Take 1 tablet (50 mg total) by mouth 2 (two) times daily. 180 tablet 1   potassium chloride SA (KLOR-CON M) 20 MEQ tablet Take 1 tablet (20 mEq total) by mouth as needed (with lasix). 30 tablet 3   SYMBICORT 160-4.5 MCG/ACT inhaler Inhale 2 puffs into the lungs as needed. 1 each 2   torsemide (DEMADEX) 20 MG tablet Take 1 tablet (20 mg total) by mouth as needed. 30 tablet 3   amoxicillin-clavulanate (AUGMENTIN) 875-125 MG tablet Take 1 tablet by mouth 2 (two) times daily. (Patient not  taking: Reported on 08/28/2022) 14 tablet 0   lidocaine (XYLOCAINE) 2 % solution Use as  directed 15 mLs in the mouth or throat as needed for mouth pain. (Patient not taking: Reported on 08/28/2022) 100 mL 0   No current facility-administered medications on file prior to visit.     Allergies: No Known Allergies  Family History: Migraine or other headaches in the family:  no Aneurysms in a first degree relative:  no Brain tumors in the family:  no Other neurological illness in the family:   no  Past Medical History: Past Medical History:  Diagnosis Date   Abnormal CT scan    a. 02/2014: evaluated for dyspnea and initially placed on Xarelto for PE because radiologist could not exclude small peripheral pulmonary emboli because of limited contrast. However, after admission for ++vaginal bleeding several days later, it was felt she did NOT have PE - underwent further testing with neg VQ 03/06/14, neg LE duplex.   Anemia    Anxiety    Arthritis    Asthma    Blood transfusion without reported diagnosis    Bone spur of right foot    CHF (congestive heart failure) ([facility])    COPD (chronic obstructive pulmonary disease) ([place])    Depression    Diabetes mellitus without complication ([place])    a. Dx 02/2014.   Heart murmur    History of echocardiogram 2015   normal EF, Grade II diastolic dysfunction   HOCM (hypertrophic obstructive cardiomyopathy) ([place])    a. Dx [address]25.   Hyperlipidemia    Hypertension    Morbid obesity ([place])    NSVT (nonsustained ventricular tachycardia) ([place])    a. Noted on tele during 02/2014 adm for symptomatic anemia.   Stroke [facility])    SVT (supraventricular tachycardia) ([place])    a. Noted on tele during 02/2014 adm for symptomatic anemia.   Thyroid disease    Vaginal bleeding     Past Surgical History Past Surgical History:  Procedure Laterality Date   COLONOSCOPY N/A 09/30/2019   Procedure: COLONOSCOPY;  Surgeon: Rogene Houston, MD;  Location: [facility];   Service: Endoscopy;  Laterality: N/A;  [place] N/A 08/07/2016   Procedure: DILATATION & CURETTAGE/HYSTEROSCOPY WITH ATTPEMPTED NOVASURE ABLATION AND INSERTION OF IUD;  Surgeon: Florian Buff, MD;  Location: [facility] ORS;  Service: Gynecology;  Laterality: N/A;    Social History: Social History   Tobacco Use   Smoking status: Never   Smokeless tobacco: Never  Vaping Use   Vaping Use: Never used  Substance Use Topics   Alcohol use: No   Drug use: No    ROS: Negative for fevers, chills. Positive for headaches, visual disturbance. All other systems reviewed and negative unless stated otherwise in HPI.   Physical Exam:   Vital Signs: BP (!) 154/83   Pulse 78   Ht $R'5\' 9"'Le$  (1.753 m)   Wt 289 lb 6 oz (131.3 kg)   BMI 42.73 kg/m  GENERAL: well appearing,in no acute distress,alert SKIN:  Color, texture, turgor normal. No rashes or lesions HEAD:  Normocephalic/atraumatic. CV:  RRR RESP: Normal respiratory effort MSK: no tenderness to palpation over occiput, neck, or shoulders  NEUROLOGICAL: Mental Status: Alert, oriented to person, place and time,Follows commands Cranial Nerves: PERRL, visual fields intact to confrontation, extraocular movements intact, facial sensation intact, no facial droop or ptosis, hearing grossly intact, no dysarthria Motor: muscle strength 5/5 both upper and lower extremities,no drift, normal tone Reflexes: 2+ throughout Sensation: intact to light touch all 4 extremities Coordination: Finger-to- nose-finger intact  bilaterally Gait: normal-based   IMPRESSION: 51 year old female with a history of HTN, HOCM, TIA, asthma, DM2, hypothyroidism, OSA, HLD who presents for evaluation of worsening migraines. Will order brain MRI for worsening headaches in patient >50. Advised patient to avoid triptans given increased stroke risk with history of TIA. Will start Nurtec for migraine rescue. She would like to start a  preventive medication as her migraine aura can be debilitating. Will start Topamax for migraine prevention.  PLAN: -MRI brain -Prevention: Start Topamax 25 mg QHS, increase by 25 mg weekly up to 100 mg QHS -Rescue: Start Nurtec 75 mg PRN   I spent a total of 26 minutes chart reviewing and counseling the patient. Headache education was done. Discussed treatment options including preventive and acute medications.  Discussed medication side effects, adverse reactions and drug interactions. Written educational materials and patient instructions outlining all of the above were given.  Follow-up: 3 months   Genia Harold, MD 08/28/2022   2:15 PM

## 2022-08-28 ENCOUNTER — Ambulatory Visit (INDEPENDENT_AMBULATORY_CARE_PROVIDER_SITE_OTHER): Payer: Medicare Other | Admitting: Psychiatry

## 2022-08-28 VITALS — BP 154/83 | HR 78 | Ht 69.0 in | Wt 289.4 lb

## 2022-08-28 DIAGNOSIS — G43119 Migraine with aura, intractable, without status migrainosus: Secondary | ICD-10-CM | POA: Diagnosis not present

## 2022-08-28 DIAGNOSIS — R519 Headache, unspecified: Secondary | ICD-10-CM | POA: Diagnosis not present

## 2022-08-28 MED ORDER — NURTEC 75 MG PO TBDP: 75.0000 mg | ORAL_TABLET | ORAL | 6 refills | Status: DC | PRN

## 2022-08-28 MED ORDER — TOPIRAMATE 25 MG PO TABS: ORAL_TABLET | ORAL | 6 refills | Status: DC

## 2022-08-28 MED ORDER — LORAZEPAM 0.5 MG PO TABS: ORAL_TABLET | ORAL | 0 refills | Status: DC

## 2022-08-28 NOTE — Patient Instructions (Addendum)
Start Topamax for headache prevention. Take 25 mg (1 pill) at bedtime for one week, then increase to 50 mg (2 pills) at bedtime for one week, then take 75 mg (3 pills) at bedtime for one week, then take 100 mg (4 pills) at bedtime  Start Nurtec 75 mg as needed for migraines. Take one pill at onset of migraine. Max dose one pill in 24 hours  MRI brain

## 2022-08-29 ENCOUNTER — Telehealth: Payer: Self-pay | Admitting: Psychiatry

## 2022-08-29 ENCOUNTER — Telehealth: Payer: Self-pay

## 2022-08-29 NOTE — Telephone Encounter (Signed)
medicare NPR sent to [facility]

## 2022-08-29 NOTE — Telephone Encounter (Signed)
PA for Nurtec has been APPROVED. 8 units per 30 days. PA case #:[id number] Pharmacy notified.

## 2022-08-29 NOTE — Telephone Encounter (Signed)
PA for Nurtec has been sent to plan via CMM. Awaiting determination from [facility]. KEY: [id number]

## 2022-09-03 ENCOUNTER — Ambulatory Visit (INDEPENDENT_AMBULATORY_CARE_PROVIDER_SITE_OTHER): Payer: Medicare Other

## 2022-09-03 ENCOUNTER — Encounter: Payer: Self-pay | Admitting: Psychiatry

## 2022-09-03 DIAGNOSIS — Z0000 Encounter for general adult medical examination without abnormal findings: Secondary | ICD-10-CM

## 2022-09-03 NOTE — Progress Notes (Signed)
[name] is a 51 y.o. female who presents for Medicare Annual (Subsequent) preventive examination.  Review of Systems    I connected with  ICYSS SKOG on 09/03/22 by a audio enabled telemedicine application and verified that I am speaking with the correct person using two identifiers.  Patient Location: Home  Provider Location: Office/Clinic  I discussed the limitations of evaluation and management by telemedicine. The patient expressed understanding and agreed to proceed.        Objective:    There were no vitals filed for this visit. There is no height or weight on file to calculate BMI.     07/21/2022   10:31 AM 08/27/2021   10:38 AM 05/28/2020   10:10 AM 03/18/2020    7:56 PM 12/29/2019    9:25 AM 09/30/2019    6:49 AM 01/31/2019   12:09 PM  Advanced Directives  Does Patient Have a Medical Advance Directive? No Yes Yes No Yes No No  Type of Advance Directive  Living will;Healthcare Power of Attorney Living will  Out of facility DNR (pink MOST or yellow form)    Does patient want to make changes to medical advance directive?     No - Patient declined    Copy of Healthcare Power of Attorney in Chart?  No - copy requested       Would patient like information on creating a medical advance directive?    No - Patient declined  No - Patient declined     Current Medications (verified) Outpatient Encounter Medications as of 09/03/2022  Medication Sig   Accu-Chek Softclix Lancets lancets USE 1 LANCET TO CHECK GLUCOSE ONCE DAILY   albuterol (PROVENTIL) (5 MG/ML) 0.5% nebulizer solution Take 0.5 mLs (2.5 mg total) by nebulization every 6 (six) hours as needed for wheezing or shortness of breath.   albuterol (VENTOLIN HFA) 108 (90 Base) MCG/ACT inhaler Inhale 2 puffs into the lungs as needed (wheezing).    amoxicillin-clavulanate (AUGMENTIN) 875-125 MG tablet Take 1 tablet by mouth 2 (two) times daily. (Patient not taking: Reported on 08/28/2022)    atorvastatin (LIPITOR) 80 MG tablet Take 1 tablet (80 mg total) by mouth daily.   blood glucose meter kit and supplies KIT Dispense based on patient and insurance preference. Use once a day. Diagnosis code:E11.9   buPROPion (WELLBUTRIN XL) 300 MG 24 hr tablet Take 1 tablet (300 mg total) by mouth daily.   butalbital-acetaminophen-caffeine (FIORICET) 50-325-40 MG tablet Take 1 tablet by mouth every 6 (six) hours as needed for headache or migraine.   Cholecalciferol (VITAMIN D3 PO) Take 10,000 Units by mouth daily.   clindamycin (CLEOCIN T) 1 % lotion    clotrimazole-betamethasone (LOTRISONE) cream APPLY CREAM TOPICALLY TWICE DAILY   Dulaglutide (TRULICITY) 3 MG/0.5ML SOPN Inject 3 mg as directed once a week.   estradiol (ESTRACE) 2 MG tablet Take 1 tablet (2 mg total) by mouth at bedtime.   glucose blood (ACCU-CHEK GUIDE) test strip USE 1 STRIP TO CHECK GLUCOSE ONCE DAILY   JARDIANCE 10 MG TABS tablet Take 1 tablet (10 mg total) by mouth daily.   Lancet Device MISC Use to check your blood sugar daily   levocetirizine (XYZAL) 5 MG tablet Take 1 tablet (5 mg total) by mouth every evening.   levonorgestrel (MIRENA) 20 MCG/24HR IUD 1 each by Intrauterine route once.   levothyroxine (SYNTHROID) 150 MCG tablet Take 1 tablet (150 mcg total) by mouth daily.   lidocaine (XYLOCAINE) 2 %  solution Use as directed 15 mLs in the mouth or throat as needed for mouth pain. (Patient not taking: Reported on 08/28/2022)   lisinopril (ZESTRIL) 2.5 MG tablet Take 1 tablet (2.5 mg total) by mouth daily.   LORazepam (ATIVAN) 0.5 MG tablet Take 1-2 pills 30 minutes prior to MRI   Magnesium 200 MG TABS Take 1 tablet by mouth as needed.   megestrol (MEGACE) 40 MG tablet Take 1 tablet by mouth once daily   metFORMIN (GLUCOPHAGE) 500 MG tablet Take 1 tablet (500 mg total) by mouth 2 (two) times daily with a meal.   metoprolol tartrate (LOPRESSOR) 50 MG tablet Take 1 tablet (50 mg total) by mouth 2 (two) times daily.    potassium chloride SA (KLOR-CON M) 20 MEQ tablet Take 1 tablet (20 mEq total) by mouth as needed (with lasix).   Rimegepant Sulfate (NURTEC) 75 MG TBDP Take 75 mg by mouth as needed.   SYMBICORT 160-4.5 MCG/ACT inhaler Inhale 2 puffs into the lungs as needed.   topiramate (TOPAMAX) 25 MG tablet Take 25 mg (1 pill) at bedtime for one week, then increase to 50 mg (2 pills) at bedtime for one week, then take 75 mg (3 pills) at bedtime for one week, then take 100 mg (4 pills) at bedtime and continue at that dose   torsemide (DEMADEX) 20 MG tablet Take 1 tablet (20 mg total) by mouth as needed.   No facility-administered encounter medications on file as of 09/03/2022.    Allergies (verified) Patient has no known allergies.   History: Past Medical History:  Diagnosis Date   Abnormal CT scan    a. 02/2014: evaluated for dyspnea and initially placed on Xarelto for PE because radiologist could not exclude small peripheral pulmonary emboli because of limited contrast. However, after admission for ++vaginal bleeding several days later, it was felt she did NOT have PE - underwent further testing with neg VQ 03/06/14, neg LE duplex.   Anemia    Anxiety    Arthritis    Asthma    Blood transfusion without reported diagnosis    Bone spur of right foot    CHF (congestive heart failure) (HCC)    COPD (chronic obstructive pulmonary disease) ([place])    Depression    Diabetes mellitus without complication ([place])    a. Dx 02/2014.   Heart murmur    History of echocardiogram 2015   normal EF, Grade II diastolic dysfunction   HOCM (hypertrophic obstructive cardiomyopathy) ([place])    a. Dx [address]016.   Hyperlipidemia    Hypertension    Morbid obesity ([place])    NSVT (nonsustained ventricular tachycardia) ([place])    a. Noted on tele during 02/2014 adm for symptomatic anemia.   Stroke [facility])    SVT (supraventricular tachycardia) ([place])    a. Noted on tele during 02/2014 adm for symptomatic anemia.   Thyroid disease     Vaginal bleeding    Past Surgical History:  Procedure Laterality Date   COLONOSCOPY N/A 09/30/2019   Procedure: COLONOSCOPY;  Surgeon: Rogene Houston, MD;  Location: [facility];  Service: Endoscopy;  Laterality: N/A;  [place] N/A 08/07/2016   Procedure: DILATATION & CURETTAGE/HYSTEROSCOPY WITH ATTPEMPTED NOVASURE ABLATION AND INSERTION OF IUD;  Surgeon: Florian Buff, MD;  Location: [facility] ORS;  Service: Gynecology;  Laterality: N/A;   Family History  Problem Relation Age of Onset   Diabetes Mother    Arthritis Mother  Other Mother        WPW   Mental illness Mother        Depression   Asthma Mother    Obesity Mother    Heart disease Father        History of coronary bypass grafting at age 93   Hypertension Father    Mental illness Sister    Bipolar disorder Sister    Heart disease Sister        possibly WPW   Depression Sister    Varicose Veins Sister    Uterine cancer Maternal Grandmother    Mental illness Maternal Grandmother    Arthritis Maternal Grandmother    Hyperlipidemia Maternal Grandmother    Heart disease Maternal Grandfather    CAD Neg Hx        No family history of EARLY CAD   Social History   Socioeconomic History   Marital status: Divorced    Spouse name: Not on file   Number of children: 0   Years of education: Not on file   Highest education level: Not on file  Occupational History   Occupation: Disabled  Tobacco Use   Smoking status: Never   Smokeless tobacco: Never  Vaping Use   Vaping Use: Never used  Substance and Sexual Activity   Alcohol use: No   Drug use: No   Sexual activity: Not Currently    Birth control/protection: I.U.D., Post-menopausal    Comment: IUD in place to control vaginal bleeding  Other Topics Concern   Not on file  Social History Narrative   Divorced for 3 years,married for 5 years.On disability since 2009 secondary to mental illness.   Social  Determinants of Health   Financial Resource Strain: Medium Risk (08/29/2022)   Overall Financial Resource Strain (CARDIA)    Difficulty of Paying Living Expenses: Somewhat hard  Food Insecurity: Food Insecurity Present (08/29/2022)   Hunger Vital Sign    Worried About Running Out of Food in the Last Year: Often true    Ran Out of Food in the Last Year: Often true  Transportation Needs: No Transportation Needs (08/29/2022)   PRAPARE - Hydrologist (Medical): No    Lack of Transportation (Non-Medical): No  Physical Activity: Insufficiently Active (08/29/2022)   Exercise Vital Sign    Days of Exercise per Week: 4 days    Minutes of Exercise per Session: 30 min  Stress: Stress Concern Present (08/29/2022)   [place]    Feeling of Stress : Very much  Social Connections: Unknown (08/29/2022)   Social Connection and Isolation Panel [NHANES]    Frequency of Communication with Friends and Family: Once a week    Frequency of Social Gatherings with Friends and Family: Never    Attends Religious Services: Not on Advertising copywriter or Organizations: No    Attends Archivist Meetings: Never    Marital Status: Divorced    Tobacco Counseling Counseling given: Not Answered   Clinical Intake:              How often do you need to have someone help you when you read instructions, pamphlets, or other written materials from your doctor or pharmacy?: 1 - Never  Diabetic?yes Nutrition Risk Assessment:  Has the patient had any N/V/D within the last 2 months?  No  Does the patient have any non-healing wounds?  No  Has the  patient had any unintentional weight loss or weight gain?  No   Diabetes:  Is the patient diabetic?  Yes  If diabetic, was a CBG obtained today?  No  Did the patient bring in their glucometer from home?  No  How often do you monitor your CBG's? Every  morning.   Financial Strains and Diabetes Management:  Are you having any financial strains with the device, your supplies or your medication? No .  Does the patient want to be seen by Chronic Care Management for management of their diabetes?  No  Would the patient like to be referred to a Nutritionist or for Diabetic Management?  No   Diabetic Exams:  Diabetic Eye Exam: Completed 12/03/21 Diabetic Foot Exam: Completed 06/13/22           Activities of Daily Living    08/29/2022   10:29 AM  In your present state of health, do you have any difficulty performing the following activities:  Hearing? 0  Vision? 0  Difficulty concentrating or making decisions? 1  Walking or climbing stairs? 0  Dressing or bathing? 0  Doing errands, shopping? 0  Preparing Food and eating ? N  Using the Toilet? N  In the past six months, have you accidently leaked urine? Y  Do you have problems with loss of bowel control? N  Managing your Medications? N  Managing your Finances? N  Housekeeping or managing your Housekeeping? N    Patient Care Team: Lindell Spar, MD as PCP - General (Internal Medicine) Herminio Commons, MD (Inactive) as PCP - Cardiology (Cardiology) Beryle Lathe, [facility] (Inactive) (Pharmacist)  Indicate any recent Medical Services you may have received from other than [facility] providers in the past year (date may be approximate).     Assessment:   This is a routine wellness examination for [place].  Hearing/Vision screen No results found.  Dietary issues and exercise activities discussed:     Goals Addressed   None   Depression Screen    08/07/2022   10:02 AM 07/23/2022   11:17 AM 06/13/2022    2:55 PM 02/12/2022    2:32 PM 11/20/2021    1:17 PM 08/27/2021   10:32 AM 08/08/2021    8:10 AM  PHQ 2/9 Scores  PHQ - 2 Score 0 0 0 0 0 0 6  PHQ- 9 Score      0 12    Fall Risk    08/29/2022   10:29 AM 08/07/2022   10:02 AM 07/23/2022   11:17 AM 06/13/2022    2:55  PM 02/12/2022    2:32 PM  Fall Risk   Falls in the past year? 0 0 0 0 0  Number falls in past yr:  0 0 0 0  Injury with Fall?  0 0 0 0  Risk for fall due to :  No Fall Risks No Fall Risks No Fall Risks No Fall Risks  Follow up  Falls evaluation completed Falls evaluation completed Falls evaluation completed Falls evaluation completed    FALL RISK PREVENTION PERTAINING TO THE HOME:  Any stairs in or around the home? Yes  If so, are there any without handrails? Yes  Home free of loose throw rugs in walkways, pet beds, electrical cords, etc? Yes  Adequate lighting in your home to reduce risk of falls? Yes   ASSISTIVE DEVICES UTILIZED TO PREVENT FALLS:  Life alert? No  Use of a cane, walker or w/c? No  Grab bars in  the bathroom? Yes  Shower chair or bench in shower? Yes  Elevated toilet seat or a handicapped toilet? No           08/27/2021   10:42 AM 12/29/2019    9:34 AM  6CIT Screen  What Year? 0 points 0 points  What month? 0 points 0 points  What time? 0 points 0 points  Count back from 20 0 points 0 points  Months in reverse 0 points 0 points  Repeat phrase 0 points 0 points  Total Score 0 points 0 points    Immunizations Immunization History  Administered Date(s) Administered   Influenza Inj Mdck Quad Pf 09/11/2016, 09/08/2018   Influenza, Quadrivalent, Recombinant, Inj, Pf 09/15/2019   Influenza-Unspecified 09/09/2019, 08/27/2021   Pneumococcal Polysaccharide-23 08/10/2015, 09/27/2020   Td 12/09/2014    TDAP status: Up to date  Flu Vaccine status: Due, Education has been provided regarding the importance of this vaccine. Advised may receive this vaccine at local pharmacy or Health Dept. Aware to provide a copy of the vaccination record if obtained from local pharmacy or Health Dept. Verbalized acceptance and understanding.    Covid-19 vaccine status: Information provided on how to obtain vaccines.   Qualifies for Shingles Vaccine? Yes   Zostavax completed  No   Shingrix Completed?: No.    Education has been provided regarding the importance of this vaccine. Patient has been advised to call insurance company to determine out of pocket expense if they have not yet received this vaccine. Advised may also receive vaccine at local pharmacy or Health Dept. Verbalized acceptance and understanding.  Screening Tests Health Maintenance  Topic Date Due   Zoster Vaccines- Shingrix (1 of 2) Never done   Diabetic kidney evaluation - Urine ACR  06/27/2021   INFLUENZA VACCINE  07/09/2022   OPHTHALMOLOGY EXAM  12/03/2022   HEMOGLOBIN A1C  12/14/2022   FOOT EXAM  06/14/2023   Diabetic kidney evaluation - GFR measurement  07/22/2023   PAP SMEAR-Modifier  05/08/2024   MAMMOGRAM  08/15/2024   TETANUS/TDAP  12/09/2024   COLONOSCOPY (Pts 45-11yrs Insurance coverage will need to be confirmed)  09/29/2029   HIV Screening  Completed   HPV VACCINES  Aged Out   COVID-19 Vaccine  Discontinued   Hepatitis C Screening  Discontinued    Health Maintenance  Health Maintenance Due  Topic Date Due   Zoster Vaccines- Shingrix (1 of 2) Never done   Diabetic kidney evaluation - Urine ACR  06/27/2021   INFLUENZA VACCINE  07/09/2022    Colorectal cancer screening: Type of screening: Colonoscopy. Completed 09/30/2019. Repeat every 10 years  Mammogram status: Completed 08/15/22. Repeat every year    Lung Cancer Screening: (Low Dose CT Chest recommended if Age 16-80 years, 30 pack-year currently smoking OR have quit w/in 15years.) does not qualify.   Lung Cancer Screening Referral: no  Additional Screening:  Hepatitis C Screening: does qualify; Completed no  Vision Screening: Recommended annual ophthalmology exams for early detection of glaucoma and other disorders of the eye. Is the patient up to date with their annual eye exam?  No  Who is the provider or what is the name of the office in which the patient attends annual eye exams? My eye doctor [place] exam  due in december If pt is not established with a provider, would they like to be referred to a provider to establish care? No .   Dental Screening: Recommended annual dental exams for proper oral hygiene  Community Resource Referral /  Chronic Care Management: CRR required this visit?  No   CCM required this visit?  No      Plan:     I have personally reviewed and noted the following in the patient's chart:   Medical and social history Use of alcohol, tobacco or illicit drugs  Current medications and supplements including opioid prescriptions. Patient is not currently taking opioid prescriptions. Functional ability and status Nutritional status Physical activity Advanced directives List of other physicians Hospitalizations, surgeries, and ER visits in previous 12 months Vitals Screenings to include cognitive, depression, and falls Referrals and appointments  In addition, I have reviewed and discussed with patient certain preventive protocols, quality metrics, and best practice recommendations. A written personalized care plan for preventive services as well as general preventive health recommendations were provided to patient.     Quentin Angst, [place]   09/03/2022

## 2022-09-03 NOTE — Patient Instructions (Signed)
  Ms. Erin Sexton , Thank you for taking time to come for your Medicare Wellness Visit. I appreciate your ongoing commitment to your health goals. Please review the following plan we discussed and let me know if I can assist you in the future.   These are the goals we discussed:  Goals      Exercise 3x per week (30 min per time)     Medication Management     Patient Goals/Self-Care Activities Patient will:  Take medications as prescribed Check blood sugar once a day at the following times: fasting (at least 8 hours since last food consumption) and whenever patient experiences symptoms of hypo/hyperglycemia, document, and provide at future appointments Weigh daily, and contact provider if weight gain of more than 3 lbs in 1 day or more than 5 lbs in 1 week Target a minimum of 150 minutes of moderate intensity exercise weekly       Patient Stated     Keep diabetes in control, find out the cause of migraines.     Weight (lb) < 200 lb (90.7 kg)     On track        This is a list of the screening recommended for you and due dates:  Health Maintenance  Topic Date Due   Zoster (Shingles) Vaccine (1 of 2) Never done   Yearly kidney health urinalysis for diabetes  06/27/2021   Flu Shot  07/09/2022   Eye exam for diabetics  12/03/2022   Hemoglobin A1C  12/14/2022   Complete foot exam   06/14/2023   Yearly kidney function blood test for diabetes  07/22/2023   Pap Smear  05/08/2024   Mammogram  08/15/2024   Tetanus Vaccine  12/09/2024   Colon Cancer Screening  09/29/2029   HIV Screening  Completed   HPV Vaccine  Aged Out   COVID-19 Vaccine  Discontinued   Hepatitis C Screening: USPSTF Recommendation to screen - Ages 17-79 yo.  Discontinued

## 2022-09-18 ENCOUNTER — Other Ambulatory Visit: Payer: Self-pay | Admitting: Internal Medicine

## 2022-09-18 DIAGNOSIS — L304 Erythema intertrigo: Secondary | ICD-10-CM

## 2022-09-19 ENCOUNTER — Ambulatory Visit (HOSPITAL_COMMUNITY)
Admission: RE | Admit: 2022-09-19 | Discharge: 2022-09-19 | Disposition: A | Discharge status: Home / Self Care | Payer: Medicare Other | Source: Ambulatory Visit | Attending: Psychiatry | Admitting: Psychiatry

## 2022-09-19 ENCOUNTER — Other Ambulatory Visit: Payer: Self-pay | Admitting: *Deleted

## 2022-09-19 DIAGNOSIS — R519 Headache, unspecified: Secondary | ICD-10-CM | POA: Insufficient documentation

## 2022-09-19 DIAGNOSIS — E119 Type 2 diabetes mellitus without complications: Secondary | ICD-10-CM

## 2022-09-19 MED ORDER — GADOBUTROL 1 MMOL/ML IV SOLN
10.0000 mL | Freq: Once | INTRAVENOUS | Status: AC | PRN
  Administered 2022-09-19: 10 mL via INTRAVENOUS

## 2022-09-19 MED ORDER — TRULICITY 3 MG/0.5ML ~~LOC~~ SOAJ: 3.0000 mg | SUBCUTANEOUS | 3 refills | Status: DC

## 2022-09-20 ENCOUNTER — Encounter: Payer: Self-pay | Admitting: Psychiatry

## 2022-10-14 ENCOUNTER — Encounter: Payer: Self-pay | Admitting: Internal Medicine

## 2022-10-15 ENCOUNTER — Encounter: Payer: Self-pay | Admitting: Internal Medicine

## 2022-10-15 ENCOUNTER — Ambulatory Visit (INDEPENDENT_AMBULATORY_CARE_PROVIDER_SITE_OTHER): Payer: Medicare Other | Admitting: Internal Medicine

## 2022-10-15 VITALS — BP 132/83 | HR 72 | Ht 69.0 in | Wt 279.6 lb

## 2022-10-15 DIAGNOSIS — E782 Mixed hyperlipidemia: Secondary | ICD-10-CM | POA: Diagnosis not present

## 2022-10-15 DIAGNOSIS — I10 Essential (primary) hypertension: Secondary | ICD-10-CM | POA: Diagnosis not present

## 2022-10-15 DIAGNOSIS — E1169 Type 2 diabetes mellitus with other specified complication: Secondary | ICD-10-CM | POA: Diagnosis not present

## 2022-10-15 DIAGNOSIS — E039 Hypothyroidism, unspecified: Secondary | ICD-10-CM

## 2022-10-15 DIAGNOSIS — I5032 Chronic diastolic (congestive) heart failure: Secondary | ICD-10-CM

## 2022-10-15 DIAGNOSIS — G43119 Migraine with aura, intractable, without status migrainosus: Secondary | ICD-10-CM | POA: Diagnosis not present

## 2022-10-15 DIAGNOSIS — J4540 Moderate persistent asthma, uncomplicated: Secondary | ICD-10-CM | POA: Diagnosis not present

## 2022-10-15 DIAGNOSIS — Z23 Encounter for immunization: Secondary | ICD-10-CM | POA: Diagnosis not present

## 2022-10-15 DIAGNOSIS — E669 Obesity, unspecified: Secondary | ICD-10-CM

## 2022-10-15 MED ORDER — METOPROLOL TARTRATE 50 MG PO TABS: 50.0000 mg | ORAL_TABLET | Freq: Two times a day (BID) | ORAL | 1 refills | Status: DC

## 2022-10-15 NOTE — Assessment & Plan Note (Signed)
Lab Results  Component Value Date   HGBA1C 6.3 (H) 06/13/2022   Well controlled On Trulicity, Jardiance and metformin Home blood glucose readings reviewed -mostly between 100-120 Had decreased dose of metformin to 500 mg twice daily Advised to follow diabetic diet On statin and ACEi F/u CMP and lipid panel Diabetic eye exam: Advised to follow up with Ophthalmology for diabetic eye exam

## 2022-10-15 NOTE — Assessment & Plan Note (Signed)
Noted on recent echo Followed by cardiology On lisinopril, Jardiance and torsemide for HFpEF

## 2022-10-15 NOTE — Assessment & Plan Note (Addendum)
Has had ER visits for atypical migraine Not candidate for triptan due to history of TIA and CHF Referred to neurology - on Topiramate and PRN Nurtec now, well-controlled

## 2022-10-15 NOTE — Progress Notes (Signed)
Established Patient Office Visit  Subjective:  Patient ID: Erin Sexton, female    DOB: June 22, 1971  Age: 51 y.o. MRN: [id number]  CC:  Chief Complaint  Patient presents with   Follow-up    Follow up recently seen neurology     HPI Erin Sexton is a 51 y.o. female with past medical history of HTN, type II DM, hypothyroidism, HLD, OSA, asthma and morbid obesity who presents for f/u of her chronic medical conditions.  Type II DM: Her blood glucose has been better controlled with Trulicity now.  She also takes Jardiance and metformin for it.  Denies any polyuria or polydipsia currently.  She also takes statin for HLD. She had lost 13 lbs with Trulicity since the last visit.  She is also doing intermittent fasting.  Migraine: She had neurology evaluation for migraine.  She has been taking Topamax and as needed Nurtec.  Her headache frequency and intensity have improved now.  HTN and HOCM: BP is well-controlled. Takes medications regularly. Patient denies headache, dizziness, chest pain, or palpitations.  She follows up with cardiology for HOCM.  She has chronic leg swelling and exertional dyspnea.   Hypothyroidism: She has been taking Levothyroxine 150 mcg QD.  Denies any recent change in appetite, tremors or palpitations.      Past Medical History:  Diagnosis Date   Abnormal CT scan    a. 02/2014: evaluated for dyspnea and initially placed on Xarelto for PE because radiologist could not exclude small peripheral pulmonary emboli because of limited contrast. However, after admission for ++vaginal bleeding several days later, it was felt she did NOT have PE - underwent further testing with neg VQ 03/06/14, neg LE duplex.   Anemia    Anxiety    Arthritis    Asthma    Blood transfusion without reported diagnosis    Bone spur of right foot    CHF (congestive heart failure) (HCC)    COPD (chronic obstructive pulmonary disease) ([place])    Depression    Diabetes mellitus without  complication ([place])    a. Dx 02/2014.   Heart murmur    History of echocardiogram 2015   normal EF, Grade II diastolic dysfunction   HOCM (hypertrophic obstructive cardiomyopathy) ([place])    a. Dx 78588.   Hyperlipidemia    Hypertension    Morbid obesity ([place])    NSVT (nonsustained ventricular tachycardia) ([place])    a. Noted on tele during 02/2014 adm for symptomatic anemia.   Stroke [facility])    SVT (supraventricular tachycardia)    a. Noted on tele during 02/2014 adm for symptomatic anemia.   Thyroid disease    Vaginal bleeding     Past Surgical History:  Procedure Laterality Date   COLONOSCOPY N/A 09/30/2019   Procedure: COLONOSCOPY;  Surgeon: Rogene Houston, MD;  Location: [facility] ENDO SUITE;  Service: Endoscopy;  Laterality: N/A;  [place] N/A 08/07/2016   Procedure: DILATATION & CURETTAGE/HYSTEROSCOPY WITH ATTPEMPTED NOVASURE ABLATION AND INSERTION OF IUD;  Surgeon: Florian Buff, MD;  Location: [facility] ORS;  Service: Gynecology;  Laterality: N/A;    Family History  Problem Relation Age of Onset   Diabetes Mother    Arthritis Mother    Other Mother        WPW   Mental illness Mother        Depression   Asthma Mother    Obesity Mother    Heart disease Father  History of coronary bypass grafting at age 26   Hypertension Father    Mental illness Sister    Bipolar disorder Sister    Heart disease Sister        possibly WPW   Depression Sister    Varicose Veins Sister    Uterine cancer Maternal Grandmother    Mental illness Maternal Grandmother    Arthritis Maternal Grandmother    Hyperlipidemia Maternal Grandmother    Heart disease Maternal Grandfather    CAD Neg Hx        No family history of EARLY CAD    Social History   Socioeconomic History   Marital status: Divorced    Spouse name: Not on file   Number of children: 0   Years of education: Not on file   Highest education level: Not on file  Occupational  History   Occupation: Disabled  Tobacco Use   Smoking status: Never   Smokeless tobacco: Never  Vaping Use   Vaping Use: Never used  Substance and Sexual Activity   Alcohol use: No   Drug use: No   Sexual activity: Not Currently    Birth control/protection: I.U.D., Post-menopausal    Comment: IUD in place to control vaginal bleeding  Other Topics Concern   Not on file  Social History Narrative   Divorced for 3 years,married for 5 years.On disability since 2009 secondary to mental illness.   Social Determinants of Health   Financial Resource Strain: Medium Risk (08/29/2022)   Overall Financial Resource Strain (CARDIA)    Difficulty of Paying Living Expenses: Somewhat hard  Food Insecurity: Food Insecurity Present (08/29/2022)   Hunger Vital Sign    Worried About Running Out of Food in the Last Year: Often true    Ran Out of Food in the Last Year: Often true  Transportation Needs: No Transportation Needs (08/29/2022)   PRAPARE - Hydrologist (Medical): No    Lack of Transportation (Non-Medical): No  Physical Activity: Insufficiently Active (08/29/2022)   Exercise Vital Sign    Days of Exercise per Week: 4 days    Minutes of Exercise per Session: 30 min  Stress: Stress Concern Present (08/29/2022)   [place]    Feeling of Stress : Very much  Social Connections: Unknown (08/29/2022)   Social Connection and Isolation Panel [NHANES]    Frequency of Communication with Friends and Family: Once a week    Frequency of Social Gatherings with Friends and Family: Never    Attends Religious Services: Not on Diplomatic Services operational officer of Clubs or Organizations: No    Attends Archivist Meetings: Never    Marital Status: Divorced  Human resources officer Violence: Not At Risk (09/03/2022)   Humiliation, Afraid, Rape, and Kick questionnaire    Fear of Current or Ex-Partner: No    Emotionally Abused:  No    Physically Abused: No    Sexually Abused: No    Outpatient Medications Prior to Visit  Medication Sig Dispense Refill   Accu-Chek Softclix Lancets lancets USE 1 LANCET TO CHECK GLUCOSE ONCE DAILY 100 each 3   albuterol (PROVENTIL) (5 MG/ML) 0.5% nebulizer solution Take 0.5 mLs (2.5 mg total) by nebulization every 6 (six) hours as needed for wheezing or shortness of breath. 20 mL 0   albuterol (VENTOLIN HFA) 108 (90 Base) MCG/ACT inhaler Inhale 2 puffs into the lungs as needed (wheezing).  atorvastatin (LIPITOR) 80 MG tablet Take 1 tablet (80 mg total) by mouth daily. 90 tablet 3   blood glucose meter kit and supplies KIT Dispense based on patient and insurance preference. Use once a day. Diagnosis code:E11.9 1 each 0   buPROPion (WELLBUTRIN XL) 300 MG 24 hr tablet Take 1 tablet (300 mg total) by mouth daily. 90 tablet 3   Cholecalciferol (VITAMIN D3 PO) Take 10,000 Units by mouth daily.     clindamycin (CLEOCIN T) 1 % lotion      clotrimazole-betamethasone (LOTRISONE) cream APPLY CREAM TOPICALLY TWICE DAILY 15 g 0   Dulaglutide (TRULICITY) 3 UT/6.5YY SOPN Inject 3 mg as directed once a week. 4 mL 3   estradiol (ESTRACE) 2 MG tablet Take 1 tablet (2 mg total) by mouth at bedtime. 90 tablet 3   glucose blood (ACCU-CHEK GUIDE) test strip USE 1 STRIP TO CHECK GLUCOSE ONCE DAILY 100 each 3   JARDIANCE 10 MG TABS tablet Take 1 tablet (10 mg total) by mouth daily. 90 tablet 3   Lancet Device MISC Use to check your blood sugar daily 100 each 5   levocetirizine (XYZAL) 5 MG tablet Take 1 tablet (5 mg total) by mouth every evening. 90 tablet 3   levonorgestrel (MIRENA) 20 MCG/24HR IUD 1 each by Intrauterine route once.     levothyroxine (SYNTHROID) 150 MCG tablet Take 1 tablet (150 mcg total) by mouth daily. 90 tablet 3   lidocaine (XYLOCAINE) 2 % solution Use as directed 15 mLs in the mouth or throat as needed for mouth pain. 100 mL 0   lisinopril (ZESTRIL) 2.5 MG tablet Take 1 tablet (2.5  mg total) by mouth daily. 90 tablet 3   Magnesium 200 MG TABS Take 1 tablet by mouth as needed.     megestrol (MEGACE) 40 MG tablet Take 1 tablet by mouth once daily 30 tablet 11   metFORMIN (GLUCOPHAGE) 500 MG tablet Take 1 tablet (500 mg total) by mouth 2 (two) times daily with a meal. 180 tablet 3   potassium chloride SA (KLOR-CON M) 20 MEQ tablet Take 1 tablet (20 mEq total) by mouth as needed (with lasix). 30 tablet 3   Rimegepant Sulfate (NURTEC) 75 MG TBDP Take 75 mg by mouth as needed. 8 tablet 6   SYMBICORT 160-4.5 MCG/ACT inhaler Inhale 2 puffs into the lungs as needed. 1 each 2   topiramate (TOPAMAX) 25 MG tablet Take 25 mg (1 pill) at bedtime for one week, then increase to 50 mg (2 pills) at bedtime for one week, then take 75 mg (3 pills) at bedtime for one week, then take 100 mg (4 pills) at bedtime and continue at that dose 120 tablet 6   torsemide (DEMADEX) 20 MG tablet Take 1 tablet (20 mg total) by mouth as needed. 30 tablet 3   butalbital-acetaminophen-caffeine (FIORICET) 50-325-40 MG tablet Take 1 tablet by mouth every 6 (six) hours as needed for headache or migraine. 14 tablet 0   LORazepam (ATIVAN) 0.5 MG tablet Take 1-2 pills 30 minutes prior to MRI 2 tablet 0   metoprolol tartrate (LOPRESSOR) 50 MG tablet Take 1 tablet (50 mg total) by mouth 2 (two) times daily. 180 tablet 1   amoxicillin-clavulanate (AUGMENTIN) 875-125 MG tablet Take 1 tablet by mouth 2 (two) times daily. 14 tablet 0   No facility-administered medications prior to visit.    No Known Allergies  ROS Review of Systems  Constitutional:  Negative for chills and fever.  HENT:  Negative for congestion, sinus pressure, sinus pain and sore throat.   Eyes:  Negative for pain and discharge.  Respiratory:  Negative for cough and shortness of breath.   Cardiovascular:  Positive for leg swelling. Negative for chest pain and palpitations.  Gastrointestinal:  Negative for abdominal pain, diarrhea, nausea and  vomiting.  Endocrine: Negative for polydipsia and polyuria.  Genitourinary:  Negative for dysuria and hematuria.  Musculoskeletal:  Positive for arthralgias. Negative for neck pain and neck stiffness.  Skin:  Negative for rash.  Neurological:  Negative for dizziness and weakness.  Psychiatric/Behavioral:  Negative for agitation and behavioral problems.       Objective:    Physical Exam Vitals reviewed.  Constitutional:      General: She is not in acute distress.    Appearance: She is obese. She is not diaphoretic.  HENT:     Head: Normocephalic and atraumatic.     Nose: Nose normal. No congestion.     Mouth/Throat:     Mouth: Mucous membranes are moist.     Pharynx: No posterior oropharyngeal erythema.  Eyes:     General: No scleral icterus.    Extraocular Movements: Extraocular movements intact.  Cardiovascular:     Rate and Rhythm: Normal rate and regular rhythm.     Pulses: Normal pulses.     Heart sounds: Normal heart sounds. No murmur heard. Pulmonary:     Breath sounds: Normal breath sounds. No wheezing or rales.  Abdominal:     Palpations: Abdomen is soft.     Tenderness: There is no abdominal tenderness.     Hernia: A hernia (Umbilical) is present.  Musculoskeletal:     Cervical back: Neck supple. No tenderness.     Right lower leg: Edema (1+) present.     Left lower leg: Edema (1+) present.  Skin:    General: Skin is warm.     Findings: No rash.  Neurological:     General: No focal deficit present.     Mental Status: She is alert and oriented to person, place, and time.  Psychiatric:        Mood and Affect: Mood normal.        Behavior: Behavior normal.     BP 132/83 (BP Location: Right Arm, Patient Position: Sitting, Cuff Size: Large)   Pulse 72   Ht _0  (1.753 m)   Wt 279 lb 9.6 oz (126.8 kg)   SpO2 96%   BMI 41.29 kg/m  Wt Readings from Last 3 Encounters:  10/15/22 279 lb 9.6 oz (126.8 kg)  08/28/22 289 lb 6 oz (131.3 kg)  08/03/22 291 lb  0.1 oz (132 kg)    Lab Results  Component Value Date   TSH 1.720 06/13/2022   Lab Results  Component Value Date   WBC 10.2 07/21/2022   HGB 16.1 (H) 07/21/2022   HCT 47.0 (H) 07/21/2022   MCV 91.1 07/21/2022   PLT 223 07/21/2022   Lab Results  Component Value Date   NA 141 07/21/2022   K 3.8 07/21/2022   CO2 22 07/21/2022   GLUCOSE 107 (H) 07/21/2022   BUN 15 07/21/2022   CREATININE 1.18 (H) 07/21/2022   BILITOT 0.5 02/12/2022   ALKPHOS 95 02/12/2022   AST 15 02/12/2022   ALT 18 02/12/2022   PROT 6.1 02/12/2022   ALBUMIN 4.0 02/12/2022   CALCIUM 8.5 (L) 07/21/2022   ANIONGAP 7 07/21/2022   EGFR 51 (L) 06/13/2022   Lab Results  Component  Value Date   CHOL 106 02/12/2022   Lab Results  Component Value Date   HDL 33 (L) 02/12/2022   Lab Results  Component Value Date   LDLCALC 46 02/12/2022   Lab Results  Component Value Date   TRIG 155 (H) 02/12/2022   Lab Results  Component Value Date   CHOLHDL 3.2 02/12/2022   Lab Results  Component Value Date   HGBA1C 6.3 (H) 06/13/2022      Assessment & Plan:   Problem List Items Addressed This Visit       Cardiovascular and Mediastinum   Essential hypertension, benign    BP Readings from Last 1 Encounters:  10/15/22 132/83  Well-controlled Counseled for compliance with the medications Advised DASH diet and moderate exercise/walking, at least 150 mins/week      Relevant Medications   metoprolol tartrate (LOPRESSOR) 50 MG tablet   Other Relevant Orders   CMP14+EGFR   CBC with Differential/Platelet   Chronic diastolic CHF (congestive heart failure) ([place])    Noted on recent echo Followed by cardiology On lisinopril, Jardiance and torsemide for HFpEF      Relevant Medications   metoprolol tartrate (LOPRESSOR) 50 MG tablet   Intractable migraine with aura without status migrainosus    Has had ER visits for atypical migraine Not candidate for triptan due to history of TIA and CHF Referred to  neurology - on Topiramate and PRN Nurtec now, well-controlled      Relevant Medications   metoprolol tartrate (LOPRESSOR) 50 MG tablet     Respiratory   Asthma, chronic    Well-controlled with Symbicort and as needed albuterol        Endocrine   Diabetes mellitus type 2, noninsulin dependent ([place]) - Primary    Lab Results  Component Value Date   HGBA1C 6.3 (H) 06/13/2022  Well controlled On Trulicity, Jardiance and metformin Home blood glucose readings reviewed -mostly between 100-120 Had decreased dose of metformin to 500 mg twice daily Advised to follow diabetic diet On statin and ACEi F/u CMP and lipid panel Diabetic eye exam: Advised to follow up with Ophthalmology for diabetic eye exam      Hypothyroidism    Lab Results  Component Value Date   TSH 1.720 06/13/2022  On levothyroxine 150 mcg daily Borderline elevated free T4 in the last visit, recheck TSH and free T4      Relevant Medications   metoprolol tartrate (LOPRESSOR) 50 MG tablet   Other Relevant Orders   T4, free   TSH     Other   Mixed hyperlipidemia    On statin Reviewed lipid profile      Relevant Medications   metoprolol tartrate (LOPRESSOR) 50 MG tablet   Other Relevant Orders   Lipid panel    Meds ordered this encounter  Medications   metoprolol tartrate (LOPRESSOR) 50 MG tablet    Sig: Take 1 tablet (50 mg total) by mouth 2 (two) times daily.    Dispense:  180 tablet    Refill:  1    Follow-up: Return in about 4 months (around 02/13/2023) for DM and HTN.    Lindell Spar, MD

## 2022-10-15 NOTE — Assessment & Plan Note (Signed)
Well-controlled with Symbicort and as needed albuterol

## 2022-10-15 NOTE — Assessment & Plan Note (Signed)
On statin Reviewed lipid profile

## 2022-10-15 NOTE — Assessment & Plan Note (Signed)
BP Readings from Last 1 Encounters:  10/15/22 132/83   Well-controlled Counseled for compliance with the medications Advised DASH diet and moderate exercise/walking, at least 150 mins/week

## 2022-10-15 NOTE — Assessment & Plan Note (Signed)
Lab Results  Component Value Date   TSH 1.720 06/13/2022   On levothyroxine 150 mcg daily Borderline elevated free T4 in the last visit, recheck TSH and free T4

## 2022-10-15 NOTE — Patient Instructions (Addendum)
Please continue taking medications as prescribed.  Please continue to follow low carb diet and ambulate as tolerated.  Please consider getting Shingrix vaccine at local pharmacy.

## 2022-10-16 DIAGNOSIS — E119 Type 2 diabetes mellitus without complications: Secondary | ICD-10-CM | POA: Diagnosis not present

## 2022-10-16 DIAGNOSIS — E039 Hypothyroidism, unspecified: Secondary | ICD-10-CM | POA: Diagnosis not present

## 2022-10-16 DIAGNOSIS — E782 Mixed hyperlipidemia: Secondary | ICD-10-CM | POA: Diagnosis not present

## 2022-10-16 DIAGNOSIS — I10 Essential (primary) hypertension: Secondary | ICD-10-CM | POA: Diagnosis not present

## 2022-10-19 LAB — T4, FREE: Free T4: 1.89 ng/dL — ABNORMAL HIGH (ref 0.82–1.77)

## 2022-10-19 LAB — CMP14+EGFR
ALT: 23 IU/L (ref 0–32)
AST: 20 IU/L (ref 0–40)
Albumin/Globulin Ratio: 2 (ref 1.2–2.2)
Albumin: 4.3 g/dL (ref 3.8–4.9)
Alkaline Phosphatase: 112 IU/L (ref 44–121)
BUN/Creatinine Ratio: 14 (ref 9–23)
BUN: 18 mg/dL (ref 6–24)
Bilirubin Total: 0.6 mg/dL (ref 0.0–1.2)
CO2: 19 mmol/L — ABNORMAL LOW (ref 20–29)
Calcium: 9.1 mg/dL (ref 8.7–10.2)
Chloride: 109 mmol/L — ABNORMAL HIGH (ref 96–106)
Creatinine, Ser: 1.3 mg/dL — ABNORMAL HIGH (ref 0.57–1.00)
Globulin, Total: 2.1 g/dL (ref 1.5–4.5)
Glucose: 95 mg/dL (ref 70–99)
Potassium: 3.8 mmol/L (ref 3.5–5.2)
Sodium: 142 mmol/L (ref 134–144)
Total Protein: 6.4 g/dL (ref 6.0–8.5)
eGFR: 49 mL/min/{1.73_m2} — ABNORMAL LOW (ref >59)

## 2022-10-19 LAB — CBC WITH DIFFERENTIAL/PLATELET
Basophils Absolute: 0.1 10*3/uL (ref 0.0–0.2)
Basos: 1 %
EOS (ABSOLUTE): 0.3 10*3/uL (ref 0.0–0.4)
Eos: 3 %
Hematocrit: 51.2 % — ABNORMAL HIGH (ref 34.0–46.6)
Hemoglobin: 17.4 g/dL — ABNORMAL HIGH (ref 11.1–15.9)
Immature Grans (Abs): 0 10*3/uL (ref 0.0–0.1)
Immature Granulocytes: 0 %
Lymphocytes Absolute: 2.8 10*3/uL (ref 0.7–3.1)
Lymphs: 26 %
MCH: 31.5 pg (ref 26.6–33.0)
MCHC: 34 g/dL (ref 31.5–35.7)
MCV: 93 fL (ref 79–97)
Monocytes Absolute: 0.6 10*3/uL (ref 0.1–0.9)
Monocytes: 5 %
Neutrophils Absolute: 7.2 10*3/uL — ABNORMAL HIGH (ref 1.4–7.0)
Neutrophils: 65 %
Platelets: 250 10*3/uL (ref 150–450)
RBC: 5.53 x10E6/uL — ABNORMAL HIGH (ref 3.77–5.28)
RDW: 12.9 % (ref 11.7–15.4)
WBC: 11 10*3/uL — ABNORMAL HIGH (ref 3.4–10.8)

## 2022-10-19 LAB — LIPID PANEL
Chol/HDL Ratio: 3.1 ratio (ref 0.0–4.4)
Cholesterol, Total: 92 mg/dL — ABNORMAL LOW (ref 100–199)
HDL: 30 mg/dL — ABNORMAL LOW (ref >39)
LDL Chol Calc (NIH): 41 mg/dL (ref 0–99)
Triglycerides: 112 mg/dL (ref 0–149)
VLDL Cholesterol Cal: 21 mg/dL (ref 5–40)

## 2022-10-19 LAB — MICROALBUMIN / CREATININE URINE RATIO
Creatinine, Urine: 123.1 mg/dL
Microalb/Creat Ratio: 17 mg/g creat (ref 0–29)
Microalbumin, Urine: 21.5 ug/mL

## 2022-10-19 LAB — TSH: TSH: 2.12 u[IU]/mL (ref 0.450–4.500)

## 2022-10-19 LAB — HEMOGLOBIN A1C
Est. average glucose Bld gHb Est-mCnc: 126 mg/dL
Hgb A1c MFr Bld: 6 % — ABNORMAL HIGH (ref 4.8–5.6)

## 2022-10-23 ENCOUNTER — Encounter: Payer: Self-pay | Admitting: Internal Medicine

## 2022-10-23 DIAGNOSIS — K5909 Other constipation: Secondary | ICD-10-CM

## 2022-10-25 ENCOUNTER — Encounter: Payer: Self-pay | Admitting: Internal Medicine

## 2022-10-25 MED ORDER — DOCUSATE SODIUM 283 MG RE ENEM: 1.0000 | ENEMA | Freq: Every day | RECTAL | 0 refills | Status: DC

## 2022-11-18 ENCOUNTER — Encounter: Payer: Self-pay | Admitting: Obstetrics & Gynecology

## 2022-11-18 ENCOUNTER — Other Ambulatory Visit: Payer: Self-pay | Admitting: Internal Medicine

## 2022-11-18 DIAGNOSIS — L304 Erythema intertrigo: Secondary | ICD-10-CM

## 2022-11-19 MED ORDER — MEGESTROL ACETATE 40 MG PO TABS: 40.0000 mg | ORAL_TABLET | Freq: Every day | ORAL | 11 refills | Status: DC

## 2022-12-03 ENCOUNTER — Ambulatory Visit: Payer: Medicare Other | Admitting: Family Medicine

## 2022-12-03 ENCOUNTER — Encounter: Payer: Self-pay | Admitting: Family Medicine

## 2022-12-04 DIAGNOSIS — E119 Type 2 diabetes mellitus without complications: Secondary | ICD-10-CM | POA: Diagnosis not present

## 2022-12-04 LAB — HM DIABETES EYE EXAM

## 2022-12-10 ENCOUNTER — Encounter: Payer: Self-pay | Admitting: Family Medicine

## 2022-12-10 ENCOUNTER — Telehealth (INDEPENDENT_AMBULATORY_CARE_PROVIDER_SITE_OTHER): Payer: Medicare Other | Admitting: Family Medicine

## 2022-12-10 DIAGNOSIS — G43E09 Chronic migraine with aura, not intractable, without status migrainosus: Secondary | ICD-10-CM

## 2022-12-10 MED ORDER — TOPIRAMATE 100 MG PO TABS: ORAL_TABLET | ORAL | 3 refills | Status: DC

## 2022-12-10 NOTE — Progress Notes (Signed)
PATIENT: Erin Sexton DOB: 31-Aug-1971  REASON FOR VISIT: follow up HISTORY FROM: patient  Virtual Visit via Telephone Note  I connected with Erin Sexton on 12/10/22 at 11:00 AM EST by telephone and verified that I am speaking with the correct person using two identifiers.   I discussed the limitations, risks, security and privacy concerns of performing an evaluation and management service by telephone and the availability of in person appointments. I also discussed with the patient that there may be a patient responsible charge related to this service. The patient expressed understanding and agreed to proceed.   History of Present Illness:  12/10/22 ALL: Erin Sexton is a 52 y.o. female here today for follow up for migraines. She was seen in consult with Dr Billey Gosling 08/2022 and started on topiramate and Nurtec. MRI was unremarkable. Since, she reports headaches have decreased in intensity and frequency. She may have 2-3 headaches a month. She is not using Nurtec regularly but reports it does work well when needed. Her A1C is now 6.1.   History (copied from Dr Erin Sexton previous note)  HPI:  Medical co-morbidities: HTN, HOCM, TIA, asthma, DM2, hypothyroidism, OSA, HLD   The patient presents for evaluation of headaches which began in July 2023. She used to have migraines in her 34s, but they had improved for several years until recently. Headaches are associated with nausea and vomiting. She will have a visual aura prior to her migraines which will last for 10 minutes at a time. She is currently averaging one migraine every 3 weeks. Headaches can last 4 days to 1 week at a time.   She has tried Imitrex injections which have been ineffective. Excedrin will help take the edge off if she takes it early enough.   Has a history of TIA with left facial droop ~3 years ago.   Headache History: Onset: July 2023 Triggers: Aura: zigzags in vision, visual distortion (looking through a  fishbowl) Location: left temple Associated Symptoms:             Photophobia: no             Phonophobia: no             Nausea: yes Vomiting: yes Worse with activity?: yes Duration of headaches: 4 days   Headache days per month: 7 Headache free days per month: 23   Current Treatment: Abortive Imitrex Excedrin   Preventative none   Prior Therapies                                 Fioricet Imitrex Triptans contraindicated due to TIA magnesium Lisinopril 2.5 mg daily Metoprolol 50 mg BID Cymbalta 60 mg daily Wellbutrin   Observations/Objective:  Generalized: Well developed, in no acute distress  Mentation: Alert oriented to time, place, history taking. Follows all commands speech and language fluent   Assessment and Plan:  52 y.o. year old female  has a past medical history of Abnormal CT scan, Anemia, Anxiety, Arthritis, Asthma, Blood transfusion without reported diagnosis, Bone spur of right foot, CHF (congestive heart failure) ([place]), COPD (chronic obstructive pulmonary disease) ([place]), Depression, Diabetes mellitus without complication ([place]), Heart murmur, History of echocardiogram (2015), HOCM (hypertrophic obstructive cardiomyopathy) ([place]), Hyperlipidemia, Hypertension, Morbid obesity ([place]), NSVT (nonsustained ventricular tachycardia) ([place]), Stroke ([place]), SVT (supraventricular tachycardia), Thyroid disease, and Vaginal bleeding. here with    ICD-10-CM   1. Chronic migraine with aura without  status migrainosus, not intractable  G43.E09      Erin Sexton is doing well on topiramate. Headaches are significantly improved. She will continue topiramate '100mg'$  daily and Nurtec daily as needed. Healthy lifestyle habits encouraged. She will follow up with me in 1 year, sooner if needed.   No orders of the defined types were placed in this encounter.   Meds ordered this encounter  Medications   topiramate (TOPAMAX) 100 MG tablet    Sig: Take 25 mg (1 pill) at bedtime for one week,  then increase to 50 mg (2 pills) at bedtime for one week, then take 75 mg (3 pills) at bedtime for one week, then take 100 mg (4 pills) at bedtime and continue at that dose    Dispense:  90 tablet    Refill:  3    Order Specific Question:   Supervising Provider    Answer:   Melvenia Beam [[id number]]     Follow Up Instructions:  I discussed the assessment and treatment plan with the patient. The patient was provided an opportunity to ask questions and all were answered. The patient agreed with the plan and demonstrated an understanding of the instructions.   The patient was advised to call back or seek an in-person evaluation if the symptoms worsen or if the condition fails to improve as anticipated.  I provided 15 minutes of non-face-to-face time during this encounter. Patient located at their place of residence during [place] visit. Provider is in the office.    Debbora Presto, NP

## 2022-12-10 NOTE — Patient Instructions (Signed)
Below is our plan:  We will continue topiramate '100mg'$  daily and Nurtec '75mg'$  daily as needed.   Please make sure you are staying well hydrated. I recommend 50-60 ounces daily. Well balanced diet and regular exercise encouraged. Consistent sleep schedule with 6-8 hours recommended.   Please continue follow up with care team as directed.   Follow up with me in 1 year   You may receive a survey regarding today's visit. I encourage you to leave honest feed back as I do use this information to improve patient care. Thank you for seeing me today!

## 2022-12-13 ENCOUNTER — Other Ambulatory Visit: Payer: Self-pay | Admitting: Internal Medicine

## 2022-12-13 DIAGNOSIS — E119 Type 2 diabetes mellitus without complications: Secondary | ICD-10-CM

## 2022-12-19 ENCOUNTER — Other Ambulatory Visit: Payer: Self-pay | Admitting: Obstetrics & Gynecology

## 2022-12-20 ENCOUNTER — Other Ambulatory Visit: Payer: Self-pay | Admitting: *Deleted

## 2022-12-20 ENCOUNTER — Encounter: Payer: Self-pay | Admitting: Obstetrics & Gynecology

## 2022-12-23 MED ORDER — ESTRADIOL 2 MG PO TABS
2.0000 mg | ORAL_TABLET | Freq: Every day | ORAL | 3 refills | Status: DC
Start: 2022-12-23 — End: 2023-12-23

## 2023-01-01 ENCOUNTER — Other Ambulatory Visit: Payer: Self-pay | Admitting: Internal Medicine

## 2023-01-01 DIAGNOSIS — E119 Type 2 diabetes mellitus without complications: Secondary | ICD-10-CM

## 2023-01-01 MED ORDER — TRULICITY 3 MG/0.5ML ~~LOC~~ SOAJ: 3.0000 mg | SUBCUTANEOUS | 0 refills | Status: DC

## 2023-01-06 DIAGNOSIS — H01001 Unspecified blepharitis right upper eyelid: Secondary | ICD-10-CM | POA: Diagnosis not present

## 2023-01-06 DIAGNOSIS — H02834 Dermatochalasis of left upper eyelid: Secondary | ICD-10-CM | POA: Diagnosis not present

## 2023-01-06 DIAGNOSIS — H25813 Combined forms of age-related cataract, bilateral: Secondary | ICD-10-CM | POA: Diagnosis not present

## 2023-01-06 DIAGNOSIS — H40013 Open angle with borderline findings, low risk, bilateral: Secondary | ICD-10-CM | POA: Diagnosis not present

## 2023-01-06 DIAGNOSIS — H02831 Dermatochalasis of right upper eyelid: Secondary | ICD-10-CM | POA: Diagnosis not present

## 2023-01-10 ENCOUNTER — Encounter: Payer: Self-pay | Admitting: Internal Medicine

## 2023-01-10 ENCOUNTER — Other Ambulatory Visit: Payer: Self-pay

## 2023-01-10 MED ORDER — BLOOD GLUCOSE MONITOR KIT: PACK | 0 refills | Status: DC

## 2023-01-13 ENCOUNTER — Other Ambulatory Visit: Payer: Self-pay

## 2023-01-13 MED ORDER — BLOOD GLUCOSE MONITOR KIT: PACK | 0 refills | Status: AC

## 2023-01-13 MED ORDER — BLOOD GLUCOSE MONITOR KIT: PACK | 0 refills | Status: DC

## 2023-01-19 ENCOUNTER — Other Ambulatory Visit: Payer: Self-pay | Admitting: Internal Medicine

## 2023-01-19 DIAGNOSIS — L304 Erythema intertrigo: Secondary | ICD-10-CM

## 2023-01-29 ENCOUNTER — Other Ambulatory Visit (HOSPITAL_COMMUNITY): Payer: Medicare Other

## 2023-01-31 ENCOUNTER — Other Ambulatory Visit: Payer: Self-pay | Admitting: Internal Medicine

## 2023-01-31 DIAGNOSIS — J4540 Moderate persistent asthma, uncomplicated: Secondary | ICD-10-CM

## 2023-01-31 DIAGNOSIS — E119 Type 2 diabetes mellitus without complications: Secondary | ICD-10-CM

## 2023-01-31 DIAGNOSIS — E785 Hyperlipidemia, unspecified: Secondary | ICD-10-CM

## 2023-01-31 DIAGNOSIS — I10 Essential (primary) hypertension: Secondary | ICD-10-CM

## 2023-01-31 DIAGNOSIS — E559 Vitamin D deficiency, unspecified: Secondary | ICD-10-CM

## 2023-01-31 DIAGNOSIS — I5032 Chronic diastolic (congestive) heart failure: Secondary | ICD-10-CM

## 2023-02-10 ENCOUNTER — Ambulatory Visit (INDEPENDENT_AMBULATORY_CARE_PROVIDER_SITE_OTHER): Payer: Medicare Other | Admitting: Internal Medicine

## 2023-02-10 ENCOUNTER — Encounter: Payer: Self-pay | Admitting: Internal Medicine

## 2023-02-10 VITALS — BP 134/85 | HR 77 | Resp 16 | Ht 69.0 in | Wt 271.2 lb

## 2023-02-10 DIAGNOSIS — I5032 Chronic diastolic (congestive) heart failure: Secondary | ICD-10-CM | POA: Diagnosis not present

## 2023-02-10 DIAGNOSIS — E039 Hypothyroidism, unspecified: Secondary | ICD-10-CM | POA: Diagnosis not present

## 2023-02-10 DIAGNOSIS — E119 Type 2 diabetes mellitus without complications: Secondary | ICD-10-CM | POA: Diagnosis not present

## 2023-02-10 DIAGNOSIS — E1169 Type 2 diabetes mellitus with other specified complication: Secondary | ICD-10-CM

## 2023-02-10 DIAGNOSIS — I10 Essential (primary) hypertension: Secondary | ICD-10-CM

## 2023-02-10 MED ORDER — TIRZEPATIDE 5 MG/0.5ML ~~LOC~~ SOAJ: 5.0000 mg | SUBCUTANEOUS | 1 refills | Status: DC

## 2023-02-10 NOTE — Assessment & Plan Note (Addendum)
Lab Results  Component Value Date   HGBA1C 6.0 (H) 10/16/2022   Well controlled Associated with HTN, CHF and HLD On Trulicity, Jardiance and metformin Home blood glucose readings reviewed -mostly between 90-120 Switched to [facility] as concern for availability of Trulicity Advised to follow diabetic diet On statin and ACEi F/u CMP and lipid panel Diabetic eye exam: Advised to follow up with Ophthalmology for diabetic eye exam

## 2023-02-10 NOTE — Patient Instructions (Signed)
Please start taking Mounjaro once you complete current supply of Trulicity.  Please continue to follow low carb diet and perform moderate exercise/walking at least 150 mins/week.

## 2023-02-10 NOTE — Progress Notes (Signed)
Established Patient Office Visit  Subjective:  Patient ID: Erin Sexton, female    DOB: 05-Mar-1971  Age: 52 y.o. MRN: [id number]  CC:  Chief Complaint  Patient presents with   Diabetes    4 mth follow up   Hypertension    4 mth follow up    HPI Erin Sexton is a 52 y.o. female with past medical history of HTN, type II DM, hypothyroidism, HLD, OSA, asthma and morbid obesity who presents for f/u of her chronic medical conditions.  Type II DM: Her blood glucose has been better controlled with Trulicity now, but has had difficulty getting it due to availability concern.  She also takes Jardiance and metformin for it.  Denies any polyuria or polydipsia currently.  She also takes statin for HLD. She had lost about 30 lbs with Trulicity.  She is also doing intermittent fasting.  Migraine: She had neurology evaluation for migraine.  She has been taking Topamax and as needed Nurtec.  Her headache frequency and intensity have improved now.  HTN and HOCM: BP is well-controlled. Takes medications regularly. Patient denies headache, dizziness, chest pain, or palpitations.  She follows up with cardiology for HOCM.  She has chronic leg swelling and exertional dyspnea.   Hypothyroidism: She has been taking Levothyroxine 150 mcg QD.  Denies any recent change in appetite, tremors or palpitations.      Past Medical History:  Diagnosis Date   Abnormal CT scan    a. 02/2014: evaluated for dyspnea and initially placed on Xarelto for PE because radiologist could not exclude small peripheral pulmonary emboli because of limited contrast. However, after admission for ++vaginal bleeding several days later, it was felt she did NOT have PE - underwent further testing with neg VQ 03/06/14, neg LE duplex.   Anemia    Anxiety    Arthritis    Asthma    Blood transfusion without reported diagnosis    Bone spur of right foot    CHF (congestive heart failure) (HCC)    COPD (chronic obstructive pulmonary  disease) ([place])    Depression    Diabetes mellitus without complication ([place])    a. Dx 02/2014.   Heart murmur    History of echocardiogram 2015   normal EF, Grade II diastolic dysfunction   HOCM (hypertrophic obstructive cardiomyopathy) ([place])    a. Dx [id number].   Hyperlipidemia    Hypertension    Morbid obesity ([place])    NSVT (nonsustained ventricular tachycardia) ([place])    a. Noted on tele during 02/2014 adm for symptomatic anemia.   Stroke [facility])    SVT (supraventricular tachycardia)    a. Noted on tele during 02/2014 adm for symptomatic anemia.   Thyroid disease    Vaginal bleeding     Past Surgical History:  Procedure Laterality Date   COLONOSCOPY N/A 09/30/2019   Procedure: COLONOSCOPY;  Surgeon: Rogene Houston, MD;  Location: [facility];  Service: Endoscopy;  Laterality: N/A;  [place] N/A 08/07/2016   Procedure: DILATATION & CURETTAGE/HYSTEROSCOPY WITH ATTPEMPTED NOVASURE ABLATION AND INSERTION OF IUD;  Surgeon: Florian Buff, MD;  Location: [facility] ORS;  Service: Gynecology;  Laterality: N/A;    Family History  Problem Relation Age of Onset   Diabetes Mother    Arthritis Mother    Other Mother        WPW   Mental illness Mother        Depression  Asthma Mother    Obesity Mother    Heart disease Father        History of coronary bypass grafting at age 61   Hypertension Father    Mental illness Sister    Bipolar disorder Sister    Heart disease Sister        possibly WPW   Depression Sister    Varicose Veins Sister    Uterine cancer Maternal Grandmother    Mental illness Maternal Grandmother    Arthritis Maternal Grandmother    Hyperlipidemia Maternal Grandmother    Heart disease Maternal Grandfather    CAD Neg Hx        No family history of EARLY CAD    Social History   Socioeconomic History   Marital status: Divorced    Spouse name: Not on file   Number of children: 0   Years of education: Not on  file   Highest education level: Not on file  Occupational History   Occupation: Disabled  Tobacco Use   Smoking status: Never   Smokeless tobacco: Never  Vaping Use   Vaping Use: Never used  Substance and Sexual Activity   Alcohol use: No   Drug use: No   Sexual activity: Not Currently    Birth control/protection: I.U.D., Post-menopausal    Comment: IUD in place to control vaginal bleeding  Other Topics Concern   Not on file  Social History Narrative   Divorced for 3 years,married for 5 years.On disability since 2009 secondary to mental illness.   Social Determinants of Health   Financial Resource Strain: Medium Risk (08/29/2022)   Overall Financial Resource Strain (CARDIA)    Difficulty of Paying Living Expenses: Somewhat hard  Food Insecurity: Food Insecurity Present (08/29/2022)   Hunger Vital Sign    Worried About Running Out of Food in the Last Year: Often true    Ran Out of Food in the Last Year: Often true  Transportation Needs: No Transportation Needs (08/29/2022)   PRAPARE - Hydrologist (Medical): No    Lack of Transportation (Non-Medical): No  Physical Activity: Insufficiently Active (08/29/2022)   Exercise Vital Sign    Days of Exercise per Week: 4 days    Minutes of Exercise per Session: 30 min  Stress: Stress Concern Present (08/29/2022)   [place]    Feeling of Stress : Very much  Social Connections: Unknown (08/29/2022)   Social Connection and Isolation Panel [NHANES]    Frequency of Communication with Friends and Family: Once a week    Frequency of Social Gatherings with Friends and Family: Never    Attends Religious Services: Not on Diplomatic Services operational officer of Clubs or Organizations: No    Attends Archivist Meetings: Never    Marital Status: Divorced  Human resources officer Violence: Not At Risk (09/03/2022)   Humiliation, Afraid, Rape, and Kick questionnaire     Fear of Current or Ex-Partner: No    Emotionally Abused: No    Physically Abused: No    Sexually Abused: No    Outpatient Medications Prior to Visit  Medication Sig Dispense Refill   Accu-Chek Softclix Lancets lancets USE 1 LANCET TO CHECK GLUCOSE ONCE DAILY 100 each 3   albuterol (PROVENTIL) (5 MG/ML) 0.5% nebulizer solution Take 0.5 mLs (2.5 mg total) by nebulization every 6 (six) hours as needed for wheezing or shortness of breath. 20 mL 0   albuterol (  VENTOLIN HFA) 108 (90 Base) MCG/ACT inhaler Inhale 2 puffs into the lungs as needed (wheezing).      atorvastatin (LIPITOR) 80 MG tablet Take 1 tablet by mouth once daily 90 tablet 0   blood glucose meter kit and supplies KIT Dispense based on patient and insurance preference. Use once a day. Diagnosis code:E11.9 1 each 0   buPROPion (WELLBUTRIN XL) 300 MG 24 hr tablet Take 1 tablet (300 mg total) by mouth daily. 90 tablet 3   Cholecalciferol (VITAMIN D3 PO) Take 10,000 Units by mouth daily.     clindamycin (CLEOCIN T) 1 % lotion      docusate sodium (ENEMEEZ) 283 MG enema Place 1 enema (283 mg total) rectally daily. 30 each 0   estradiol (ESTRACE) 2 MG tablet Take 1 tablet (2 mg total) by mouth at bedtime. 90 tablet 3   glucose blood (ACCU-CHEK GUIDE) test strip USE 1 STRIP TO CHECK GLUCOSE ONCE DAILY 100 each 3   JARDIANCE 10 MG TABS tablet Take 1 tablet (10 mg total) by mouth daily. 90 tablet 3   Lancet Device MISC Use to check your blood sugar daily 100 each 5   levocetirizine (XYZAL) 5 MG tablet Take 1 tablet (5 mg total) by mouth every evening. 90 tablet 3   levonorgestrel (MIRENA) 20 MCG/24HR IUD 1 each by Intrauterine route once.     levothyroxine (SYNTHROID) 150 MCG tablet Take 1 tablet (150 mcg total) by mouth daily. 90 tablet 3   lidocaine (XYLOCAINE) 2 % solution Use as directed 15 mLs in the mouth or throat as needed for mouth pain. 100 mL 0   lisinopril (ZESTRIL) 2.5 MG tablet Take 1 tablet (2.5 mg total) by mouth daily.  90 tablet 3   Magnesium 200 MG TABS Take 1 tablet by mouth as needed.     megestrol (MEGACE) 40 MG tablet Take 1 tablet (40 mg total) by mouth daily. 30 tablet 11   metFORMIN (GLUCOPHAGE) 500 MG tablet Take 1 tablet (500 mg total) by mouth 2 (two) times daily with a meal. 180 tablet 3   metoprolol tartrate (LOPRESSOR) 50 MG tablet Take 1 tablet (50 mg total) by mouth 2 (two) times daily. 180 tablet 1   potassium chloride SA (KLOR-CON M) 20 MEQ tablet Take 1 tablet (20 mEq total) by mouth as needed (with lasix). 30 tablet 3   Rimegepant Sulfate (NURTEC) 75 MG TBDP Take 75 mg by mouth as needed. 8 tablet 6   SYMBICORT 160-4.5 MCG/ACT inhaler Inhale 2 puffs into the lungs as needed. 1 each 2   topiramate (TOPAMAX) 100 MG tablet Take 25 mg (1 pill) at bedtime for one week, then increase to 50 mg (2 pills) at bedtime for one week, then take 75 mg (3 pills) at bedtime for one week, then take 100 mg (4 pills) at bedtime and continue at that dose 90 tablet 3   torsemide (DEMADEX) 20 MG tablet Take 1 tablet (20 mg total) by mouth as needed. 30 tablet 3   Dulaglutide (TRULICITY) 3 0000000 SOPN Inject 3 mg as directed once a week. 4 mL 0   clotrimazole-betamethasone (LOTRISONE) cream APPLY  CREAM TOPICALLY TO AFFECTED AREA TWICE DAILY (Patient not taking: Reported on 02/10/2023) 15 g 0   No facility-administered medications prior to visit.    No Known Allergies  ROS Review of Systems  Constitutional:  Negative for chills and fever.  HENT:  Negative for congestion, sinus pressure, sinus pain and sore throat.  Eyes:  Negative for pain and discharge.  Respiratory:  Negative for cough and shortness of breath.   Cardiovascular:  Positive for leg swelling. Negative for chest pain and palpitations.  Gastrointestinal:  Negative for abdominal pain, diarrhea, nausea and vomiting.  Endocrine: Negative for polydipsia and polyuria.  Genitourinary:  Negative for dysuria and hematuria.  Musculoskeletal:  Positive  for arthralgias. Negative for neck pain and neck stiffness.  Skin:  Negative for rash.  Neurological:  Negative for dizziness and weakness.  Psychiatric/Behavioral:  Negative for agitation and behavioral problems.       Objective:    Physical Exam Vitals reviewed.  Constitutional:      General: She is not in acute distress.    Appearance: She is obese. She is not diaphoretic.  HENT:     Head: Normocephalic and atraumatic.     Nose: Nose normal. No congestion.     Mouth/Throat:     Mouth: Mucous membranes are moist.     Pharynx: No posterior oropharyngeal erythema.  Eyes:     General: No scleral icterus.    Extraocular Movements: Extraocular movements intact.  Cardiovascular:     Rate and Rhythm: Normal rate and regular rhythm.     Pulses: Normal pulses.     Heart sounds: Normal heart sounds. No murmur heard. Pulmonary:     Breath sounds: Normal breath sounds. No wheezing or rales.  Abdominal:     Palpations: Abdomen is soft.     Tenderness: There is no abdominal tenderness.     Hernia: A hernia (Umbilical) is present.  Musculoskeletal:     Cervical back: Neck supple. No tenderness.     Right lower leg: Edema (1+) present.     Left lower leg: Edema (1+) present.  Skin:    General: Skin is warm.     Findings: No rash.  Neurological:     General: No focal deficit present.     Mental Status: She is alert and oriented to person, place, and time.  Psychiatric:        Mood and Affect: Mood normal.        Behavior: Behavior normal.     BP 134/85   Pulse 77   Resp 16   Ht '5\' 9"'$  (1.753 m)   Wt 271 lb 3.2 oz (123 kg)   SpO2 96%   BMI 40.05 kg/m  Wt Readings from Last 3 Encounters:  02/10/23 271 lb 3.2 oz (123 kg)  10/15/22 279 lb 9.6 oz (126.8 kg)  08/28/22 289 lb 6 oz (131.3 kg)    Lab Results  Component Value Date   TSH 2.120 10/16/2022   Lab Results  Component Value Date   WBC 11.0 (H) 10/16/2022   HGB 17.4 (H) 10/16/2022   HCT 51.2 (H) 10/16/2022   MCV  93 10/16/2022   PLT 250 10/16/2022   Lab Results  Component Value Date   NA 142 02/10/2023   K 3.9 02/10/2023   CO2 18 (L) 02/10/2023   GLUCOSE 78 02/10/2023   BUN 16 02/10/2023   CREATININE 1.26 (H) 02/10/2023   BILITOT 0.7 02/10/2023   ALKPHOS 112 02/10/2023   AST 24 02/10/2023   ALT 28 02/10/2023   PROT 6.6 02/10/2023   ALBUMIN 4.4 02/10/2023   CALCIUM 8.9 02/10/2023   ANIONGAP 7 07/21/2022   EGFR 51 (L) 02/10/2023   Lab Results  Component Value Date   CHOL 92 (L) 10/16/2022   Lab Results  Component Value Date  HDL 30 (L) 10/16/2022   Lab Results  Component Value Date   LDLCALC 41 10/16/2022   Lab Results  Component Value Date   TRIG 112 10/16/2022   Lab Results  Component Value Date   CHOLHDL 3.1 10/16/2022   Lab Results  Component Value Date   HGBA1C 5.7 (H) 02/10/2023      Assessment & Plan:   Problem List Items Addressed This Visit       Cardiovascular and Mediastinum   Essential hypertension, benign    BP Readings from Last 1 Encounters:  02/10/23 134/85  Well-controlled Counseled for compliance with the medications Advised DASH diet and moderate exercise/walking, at least 150 mins/week      Chronic diastolic CHF (congestive heart failure) ([place])    Noted on recent echo Followed by cardiology On lisinopril, Jardiance and torsemide for HFpEF        Endocrine   Type 2 diabetes mellitus with other specified complication ([place]) - Primary    Lab Results  Component Value Date   HGBA1C 6.0 (H) 10/16/2022  Well controlled Associated with HTN, CHF and HLD On Trulicity, Jardiance and metformin Home blood glucose readings reviewed -mostly between 90-120 Switched to [facility] as concern for availability of Trulicity Advised to follow diabetic diet On statin and ACEi F/u CMP and lipid panel Diabetic eye exam: Advised to follow up with Ophthalmology for diabetic eye exam      Relevant Medications   tirzepatide (MOUNJARO) 5 MG/0.5ML Pen    Other Relevant Orders   CMP14+EGFR (Completed)   Hemoglobin A1c (Completed)   Hypothyroidism    Lab Results  Component Value Date   TSH 2.120 10/16/2022  On levothyroxine 150 mcg daily Borderline elevated free T4 in the last visit, recheck TSH and free T4        Other   Morbid obesity ([place])    Continue low-carb diet and moderate exercise as tolerated On Trulicity currently, switched to [employer]      Relevant Medications   tirzepatide (MOUNJARO) 5 MG/0.5ML Pen    Meds ordered this encounter  Medications   tirzepatide (MOUNJARO) 5 MG/0.5ML Pen    Sig: Inject 5 mg into the skin once a week.    Dispense:  6 mL    Refill:  1    Follow-up: Return in about 4 months (around 06/12/2023) for DM and CKD.    Lindell Spar, MD

## 2023-02-11 ENCOUNTER — Encounter: Payer: Self-pay | Admitting: Internal Medicine

## 2023-02-11 LAB — CMP14+EGFR
ALT: 28 IU/L (ref 0–32)
AST: 24 IU/L (ref 0–40)
Albumin/Globulin Ratio: 2 (ref 1.2–2.2)
Albumin: 4.4 g/dL (ref 3.8–4.9)
Alkaline Phosphatase: 112 IU/L (ref 44–121)
BUN/Creatinine Ratio: 13 (ref 9–23)
BUN: 16 mg/dL (ref 6–24)
Bilirubin Total: 0.7 mg/dL (ref 0.0–1.2)
CO2: 18 mmol/L — ABNORMAL LOW (ref 20–29)
Calcium: 8.9 mg/dL (ref 8.7–10.2)
Chloride: 110 mmol/L — ABNORMAL HIGH (ref 96–106)
Creatinine, Ser: 1.26 mg/dL — ABNORMAL HIGH (ref 0.57–1.00)
Globulin, Total: 2.2 g/dL (ref 1.5–4.5)
Glucose: 78 mg/dL (ref 70–99)
Potassium: 3.9 mmol/L (ref 3.5–5.2)
Sodium: 142 mmol/L (ref 134–144)
Total Protein: 6.6 g/dL (ref 6.0–8.5)
eGFR: 51 mL/min/{1.73_m2} — ABNORMAL LOW (ref >59)

## 2023-02-11 LAB — HEMOGLOBIN A1C
Est. average glucose Bld gHb Est-mCnc: 117 mg/dL
Hgb A1c MFr Bld: 5.7 % — ABNORMAL HIGH (ref 4.8–5.6)

## 2023-02-14 NOTE — Assessment & Plan Note (Signed)
Lab Results  Component Value Date   TSH 2.120 10/16/2022   On levothyroxine 150 mcg daily Borderline elevated free T4 in the last visit, recheck TSH and free T4

## 2023-02-14 NOTE — Assessment & Plan Note (Signed)
BP Readings from Last 1 Encounters:  02/10/23 134/85   Well-controlled Counseled for compliance with the medications Advised DASH diet and moderate exercise/walking, at least 150 mins/week

## 2023-02-14 NOTE — Assessment & Plan Note (Signed)
Continue low-carb diet and moderate exercise as tolerated On Trulicity currently, switched to [facility]

## 2023-02-14 NOTE — Assessment & Plan Note (Signed)
Noted on recent echo Followed by cardiology On lisinopril, Jardiance and torsemide for HFpEF 

## 2023-02-17 ENCOUNTER — Ambulatory Visit: Payer: Medicare Other | Admitting: Internal Medicine

## 2023-04-08 ENCOUNTER — Other Ambulatory Visit: Payer: Self-pay | Admitting: Internal Medicine

## 2023-04-08 DIAGNOSIS — L304 Erythema intertrigo: Secondary | ICD-10-CM

## 2023-04-27 ENCOUNTER — Other Ambulatory Visit: Payer: Self-pay | Admitting: Internal Medicine

## 2023-04-27 DIAGNOSIS — I5032 Chronic diastolic (congestive) heart failure: Secondary | ICD-10-CM

## 2023-04-27 DIAGNOSIS — E785 Hyperlipidemia, unspecified: Secondary | ICD-10-CM

## 2023-04-27 DIAGNOSIS — E559 Vitamin D deficiency, unspecified: Secondary | ICD-10-CM

## 2023-04-27 DIAGNOSIS — J4540 Moderate persistent asthma, uncomplicated: Secondary | ICD-10-CM

## 2023-04-27 DIAGNOSIS — I10 Essential (primary) hypertension: Secondary | ICD-10-CM

## 2023-04-27 DIAGNOSIS — E119 Type 2 diabetes mellitus without complications: Secondary | ICD-10-CM

## 2023-04-28 ENCOUNTER — Other Ambulatory Visit: Payer: Self-pay | Admitting: Internal Medicine

## 2023-04-28 DIAGNOSIS — E559 Vitamin D deficiency, unspecified: Secondary | ICD-10-CM

## 2023-04-28 DIAGNOSIS — J4540 Moderate persistent asthma, uncomplicated: Secondary | ICD-10-CM

## 2023-04-28 DIAGNOSIS — I5032 Chronic diastolic (congestive) heart failure: Secondary | ICD-10-CM

## 2023-04-28 DIAGNOSIS — E119 Type 2 diabetes mellitus without complications: Secondary | ICD-10-CM

## 2023-04-28 DIAGNOSIS — E785 Hyperlipidemia, unspecified: Secondary | ICD-10-CM

## 2023-04-28 DIAGNOSIS — I10 Essential (primary) hypertension: Secondary | ICD-10-CM

## 2023-05-09 ENCOUNTER — Encounter: Payer: Self-pay | Admitting: Internal Medicine

## 2023-06-16 ENCOUNTER — Ambulatory Visit (INDEPENDENT_AMBULATORY_CARE_PROVIDER_SITE_OTHER): Payer: Medicare Other | Admitting: Internal Medicine

## 2023-06-16 ENCOUNTER — Encounter: Payer: Self-pay | Admitting: Internal Medicine

## 2023-06-16 VITALS — BP 130/84 | HR 79 | Ht 69.0 in | Wt 256.8 lb

## 2023-06-16 DIAGNOSIS — E1169 Type 2 diabetes mellitus with other specified complication: Secondary | ICD-10-CM

## 2023-06-16 DIAGNOSIS — I5032 Chronic diastolic (congestive) heart failure: Secondary | ICD-10-CM

## 2023-06-16 DIAGNOSIS — F3341 Major depressive disorder, recurrent, in partial remission: Secondary | ICD-10-CM

## 2023-06-16 DIAGNOSIS — E039 Hypothyroidism, unspecified: Secondary | ICD-10-CM

## 2023-06-16 DIAGNOSIS — E559 Vitamin D deficiency, unspecified: Secondary | ICD-10-CM

## 2023-06-16 DIAGNOSIS — L304 Erythema intertrigo: Secondary | ICD-10-CM | POA: Diagnosis not present

## 2023-06-16 DIAGNOSIS — E782 Mixed hyperlipidemia: Secondary | ICD-10-CM | POA: Diagnosis not present

## 2023-06-16 DIAGNOSIS — N1831 Chronic kidney disease, stage 3a: Secondary | ICD-10-CM | POA: Diagnosis not present

## 2023-06-16 DIAGNOSIS — J4540 Moderate persistent asthma, uncomplicated: Secondary | ICD-10-CM | POA: Diagnosis not present

## 2023-06-16 DIAGNOSIS — I10 Essential (primary) hypertension: Secondary | ICD-10-CM

## 2023-06-16 MED ORDER — NYSTATIN 100000 UNIT/GM EX POWD
1.0000 | Freq: Three times a day (TID) | CUTANEOUS | 0 refills | Status: DC
Start: 2023-06-16 — End: 2024-09-06

## 2023-06-16 MED ORDER — BUPROPION HCL ER (XL) 300 MG PO TB24: 300.0000 mg | ORAL_TABLET | Freq: Every day | ORAL | 3 refills | Status: DC

## 2023-06-16 MED ORDER — METOPROLOL TARTRATE 50 MG PO TABS: 50.0000 mg | ORAL_TABLET | Freq: Two times a day (BID) | ORAL | 1 refills | Status: DC

## 2023-06-16 MED ORDER — LEVOCETIRIZINE DIHYDROCHLORIDE 5 MG PO TABS: 5.0000 mg | ORAL_TABLET | Freq: Every evening | ORAL | 3 refills | Status: DC

## 2023-06-16 MED ORDER — ATORVASTATIN CALCIUM 80 MG PO TABS: 80.0000 mg | ORAL_TABLET | Freq: Every day | ORAL | 3 refills | Status: DC

## 2023-06-16 MED ORDER — TIRZEPATIDE 7.5 MG/0.5ML ~~LOC~~ SOAJ
7.5000 mg | SUBCUTANEOUS | 0 refills | Status: DC
Start: 2023-06-16 — End: 2023-10-17

## 2023-06-16 MED ORDER — LISINOPRIL 2.5 MG PO TABS: 2.5000 mg | ORAL_TABLET | Freq: Every day | ORAL | 3 refills | Status: DC

## 2023-06-16 MED ORDER — TIRZEPATIDE 10 MG/0.5ML ~~LOC~~ SOAJ
10.0000 mg | SUBCUTANEOUS | 1 refills | Status: DC
Start: 2023-07-14 — End: 2023-12-04

## 2023-06-16 MED ORDER — EMPAGLIFLOZIN 10 MG PO TABS: 10.0000 mg | ORAL_TABLET | Freq: Every day | ORAL | 3 refills | Status: DC

## 2023-06-16 NOTE — Assessment & Plan Note (Signed)
Well-controlled with Symbicort and as needed albuterol ?

## 2023-06-16 NOTE — Assessment & Plan Note (Signed)
On statin Reviewed lipid profile 

## 2023-06-16 NOTE — Assessment & Plan Note (Signed)
Well controlled with bupropion

## 2023-06-16 NOTE — Assessment & Plan Note (Signed)
Lab Results  Component Value Date   TSH 2.120 10/16/2022   On levothyroxine 150 mcg daily Borderline elevated free T4 in the last visit, recheck TSH and free T4 

## 2023-06-16 NOTE — Assessment & Plan Note (Signed)
BMI Readings from Last 3 Encounters:  06/16/23 37.92 kg/m  02/10/23 40.05 kg/m  10/15/22 41.29 kg/m   Continue low-carb diet and moderate exercise as tolerated On Mounjaro for type 2 DM

## 2023-06-16 NOTE — Assessment & Plan Note (Signed)
Noted on recent echo Followed by cardiology On lisinopril, Jardiance and torsemide for HFpEF 

## 2023-06-16 NOTE — Progress Notes (Signed)
Established Patient Office Visit  Subjective:  Patient ID: Erin Sexton, female    DOB: [birth year]  Age: 52 y.o. MRN: [id number]  CC:  Chief Complaint  Patient presents with   Diabetes    Four month follow up    Chronic Kidney Disease    Four month follow up     HPI Erin Sexton is a 52 y.o. female with past medical history of HTN, type II DM, hypothyroidism, HLD, OSA, asthma and morbid obesity who presents for f/u of her chronic medical conditions.  Type II DM: Her blood glucose has been better controlled with Mounjaro 5 mg qw now. She also takes Jardiance and metformin for it.  Denies any polyuria or polydipsia currently.  She also takes statin for HLD. She has lost additional 15 lbs since the last visit. She had lost about 30 lbs with Trulicity.  She is also doing intermittent fasting.  CKD: Her last CMP showed GFR of 51.  She denies any dysuria, hematuria or urinary hesitance or resistance.  Migraine: She had neurology evaluation for migraine.  She has been taking Topamax and as needed Nurtec.  Her headache frequency and intensity have improved now.  HTN and HOCM: BP is well-controlled. Takes medications regularly. Patient denies headache, dizziness, chest pain, or palpitations.  She follows up with cardiology for HOCM.  She has chronic leg swelling and exertional dyspnea.   Hypothyroidism: She has been taking Levothyroxine 150 mcg QD.  Denies any recent change in appetite, tremors or palpitations.      Past Medical History:  Diagnosis Date   Abnormal CT scan    a. 02/2014: evaluated for dyspnea and initially placed on Xarelto for PE because radiologist could not exclude small peripheral pulmonary emboli because of limited contrast. However, after admission for ++vaginal bleeding several days later, it was felt she did NOT have PE - underwent further testing with neg VQ 03/06/14, neg LE duplex.   Anemia    Anxiety    Arthritis    Asthma    Blood transfusion without  reported diagnosis    Bone spur of right foot    CHF (congestive heart failure) (HCC)    COPD (chronic obstructive pulmonary disease) (HCC)    Depression    Diabetes mellitus without complication (HCC)    a. Dx 02/2014.   Heart murmur    History of echocardiogram 2015   normal EF, Grade II diastolic dysfunction   HOCM (hypertrophic obstructive cardiomyopathy) (HCC)    a. Dx 40981.   Hyperlipidemia    Hypertension    Morbid obesity (HCC)    NSVT (nonsustained ventricular tachycardia) (HCC)    a. Noted on tele during 02/2014 adm for symptomatic anemia.   Stroke [facility])    SVT (supraventricular tachycardia)    a. Noted on tele during 02/2014 adm for symptomatic anemia.   Thyroid disease    Vaginal bleeding     Past Surgical History:  Procedure Laterality Date   COLONOSCOPY N/A 09/30/2019   Procedure: COLONOSCOPY;  Surgeon: Malissa Hippo, MD;  Location: [facility] END[facility] SUITE;  Service: Endoscopy;  Laterality: N/A;  730   DILITATION & CURRETTAGE/HYSTROSCOPY WITH NOVASURE ABLATION N/A 08/07/2016   Procedure: DILATATION & CURETTAGE/HYSTEROSCOPY WITH ATTPEMPTED NOVASURE ABLATION AND INSERTION OF IUD;  Surgeon: Lazaro Arms, MD;  Location: [facility] ORS;  Service: Gynecology;  Laterality: N/A;    Family History  Problem Relation Age of Onset   Diabetes Mother    Arthritis Mother  Other Mother        WPW   Mental illness Mother        Depression   Asthma Mother    Obesity Mother    Heart disease Father        History of coronary bypass grafting at age 44   Hypertension Father    Mental illness Sister    Bipolar disorder Sister    Heart disease Sister        possibly WPW   Depression Sister    Varicose Veins Sister    Uterine cancer Maternal Grandmother    Mental illness Maternal Grandmother    Arthritis Maternal Grandmother    Hyperlipidemia Maternal Grandmother    Heart disease Maternal Grandfather    CAD Neg Hx        No family history of EARLY CAD    Social History    Socioeconomic History   Marital status: Divorced    Spouse name: Not on file   Number of children: 0   Years of education: Not on file   Highest education level: Associate degree: academic program  Occupational History   Occupation: Disabled  Tobacco Use   Smoking status: Never   Smokeless tobacco: Never  Vaping Use   Vaping Use: Never used  Substance and Sexual Activity   Alcohol use: No   Drug use: No   Sexual activity: Not Currently    Birth control/protection: I.U.D., Post-menopausal    Comment: IUD in place to control vaginal bleeding  Other Topics Concern   Not on file  Social History Narrative   Divorced for 3 years,married for 5 years.On disability since 2009 secondary to mental illness.   Social Determinants of Health   Financial Resource Strain: Medium Risk (06/12/2023)   Overall Financial Resource Strain (CARDIA)    Difficulty of Paying Living Expenses: Somewhat hard  Food Insecurity: Food Insecurity Present (06/12/2023)   Hunger Vital Sign    Worried About Running Out of Food in the Last Year: Sometimes true    Ran Out of Food in the Last Year: Often true  Transportation Needs: No Transportation Needs (06/12/2023)   PRAPARE - Administrator, Civil Service (Medical): No    Lack of Transportation (Non-Medical): No  Physical Activity: Sufficiently Active (06/12/2023)   Exercise Vital Sign    Days of Exercise per Week: 7 days    Minutes of Exercise per Session: 60 min  Stress: Stress Concern Present (08/29/2022)   [employer] of Occupational Health - Occupational Stress Questionnaire    Feeling of Stress : Very much  Social Connections: Socially Isolated (06/12/2023)   Social Connection and Isolation Panel [NHANES]    Frequency of Communication with Friends and Family: Once a week    Frequency of Social Gatherings with Friends and Family: Never    Attends Religious Services: Never    Database administrator or Organizations: No    Attends Tax inspector Meetings: Never    Marital Status: Divorced  Catering manager Violence: Not At Risk (09/03/2022)   Humiliation, Afraid, Rape, and Kick questionnaire    Fear of Current or Ex-Partner: No    Emotionally Abused: No    Physically Abused: No    Sexually Abused: No    Outpatient Medications Prior to Visit  Medication Sig Dispense Refill   Accu-Chek Softclix Lancets lancets USE 1 LANCET TO CHECK GLUCOSE ONCE DAILY 100 each 3   albuterol (PROVENTIL) (5 MG/ML) 0.5% nebulizer solution Take  0.5 mLs (2.5 mg total) by nebulization every 6 (six) hours as needed for wheezing or shortness of breath. 20 mL 0   albuterol (VENTOLIN HFA) 108 (90 Base) MCG/ACT inhaler Inhale 2 puffs into the lungs as needed (wheezing).      blood glucose meter kit and supplies KIT Dispense based on patient and insurance preference. Use once a day. Diagnosis code:E11.9 1 each 0   Cholecalciferol (VITAMIN D3 PO) Take 10,000 Units by mouth daily.     clindamycin (CLEOCIN T) 1 % lotion      clotrimazole-betamethasone (LOTRISONE) cream APPLY  CREAM TOPICALLY TO AFFECTED AREA TWICE DAILY 30 g 0   docusate sodium (ENEMEEZ) 283 MG enema Place 1 enema (283 mg total) rectally daily. 30 each 0   estradiol (ESTRACE) 2 MG tablet Take 1 tablet (2 mg total) by mouth at bedtime. 90 tablet 3   glucose blood (ACCU-CHEK GUIDE) test strip USE 1 STRIP TO CHECK GLUCOSE ONCE DAILY 100 each 3   Lancet Device MISC Use to check your blood sugar daily 100 each 5   levonorgestrel (MIRENA) 20 MCG/24HR IUD 1 each by Intrauterine route once.     levothyroxine (SYNTHROID) 150 MCG tablet Take 1 tablet (150 mcg total) by mouth daily. 90 tablet 3   lidocaine (XYLOCAINE) 2 % solution Use as directed 15 mLs in the mouth or throat as needed for mouth pain. 100 mL 0   Magnesium 200 MG TABS Take 1 tablet by mouth as needed.     megestrol (MEGACE) 40 MG tablet Take 1 tablet (40 mg total) by mouth daily. 30 tablet 11   potassium chloride SA (KLOR-CON M)  20 MEQ tablet Take 1 tablet (20 mEq total) by mouth as needed (with lasix). 30 tablet 3   Rimegepant Sulfate (NURTEC) 75 MG TBDP Take 75 mg by mouth as needed. 8 tablet 6   SYMBICORT 160-4.5 MCG/ACT inhaler Inhale 2 puffs into the lungs as needed. 1 each 2   topiramate (TOPAMAX) 100 MG tablet Take 25 mg (1 pill) at bedtime for one week, then increase to 50 mg (2 pills) at bedtime for one week, then take 75 mg (3 pills) at bedtime for one week, then take 100 mg (4 pills) at bedtime and continue at that dose 90 tablet 3   torsemide (DEMADEX) 20 MG tablet Take 1 tablet (20 mg total) by mouth as needed. 30 tablet 3   atorvastatin (LIPITOR) 80 MG tablet Take 1 tablet by mouth once daily 90 tablet 0   buPROPion (WELLBUTRIN XL) 300 MG 24 hr tablet Take 1 tablet (300 mg total) by mouth daily. 90 tablet 3   JARDIANCE 10 MG TABS tablet Take 1 tablet (10 mg total) by mouth daily. 90 tablet 3   levocetirizine (XYZAL) 5 MG tablet Take 1 tablet (5 mg total) by mouth every evening. 90 tablet 3   lisinopril (ZESTRIL) 2.5 MG tablet Take 1 tablet (2.5 mg total) by mouth daily. 90 tablet 3   metFORMIN (GLUCOPHAGE) 500 MG tablet Take 1 tablet (500 mg total) by mouth 2 (two) times daily with a meal. 180 tablet 3   metoprolol tartrate (LOPRESSOR) 50 MG tablet Take 1 tablet (50 mg total) by mouth 2 (two) times daily. 180 tablet 1   tirzepatide (MOUNJARO) 5 MG/0.5ML Pen Inject 5 mg into the skin once a week. 6 mL 1   No facility-administered medications prior to visit.    No Known Allergies  ROS Review of Systems  Constitutional:  Negative for chills and fever.  HENT:  Negative for congestion, sinus pressure, sinus pain and sore throat.   Eyes:  Negative for pain and discharge.  Respiratory:  Negative for cough and shortness of breath.   Cardiovascular:  Positive for leg swelling. Negative for chest pain and palpitations.  Gastrointestinal:  Negative for abdominal pain, diarrhea, nausea and vomiting.  Endocrine:  Negative for polydipsia and polyuria.  Genitourinary:  Negative for dysuria and hematuria.  Musculoskeletal:  Positive for arthralgias. Negative for neck pain and neck stiffness.  Skin:  Negative for rash.  Neurological:  Negative for dizziness and weakness.  Psychiatric/Behavioral:  Negative for agitation and behavioral problems.       Objective:    Physical Exam Vitals reviewed.  Constitutional:      General: She is not in acute distress.    Appearance: She is obese. She is not diaphoretic.  HENT:     Head: Normocephalic and atraumatic.     Nose: Nose normal. No congestion.     Mouth/Throat:     Mouth: Mucous membranes are moist.     Pharynx: No posterior oropharyngeal erythema.  Eyes:     General: No scleral icterus.    Extraocular Movements: Extraocular movements intact.  Cardiovascular:     Rate and Rhythm: Normal rate and regular rhythm.     Pulses: Normal pulses.     Heart sounds: Normal heart sounds. No murmur heard. Pulmonary:     Breath sounds: Normal breath sounds. No wheezing or rales.  Abdominal:     Palpations: Abdomen is soft.     Tenderness: There is no abdominal tenderness.     Hernia: A hernia (Umbilical) is present.  Musculoskeletal:     Cervical back: Neck supple. No tenderness.     Right lower leg: Edema (1+) present.     Left lower leg: Edema (1+) present.  Skin:    General: Skin is warm.     Findings: No rash.  Neurological:     General: No focal deficit present.     Mental Status: She is alert and oriented to person, place, and time.  Psychiatric:        Mood and Affect: Mood normal.        Behavior: Behavior normal.     BP 130/84 (BP Location: Right Arm, Patient Position: Sitting, Cuff Size: Large)   Pulse 79   Ht 5\' 9"  (1.753 m)   Wt 256 lb 12.8 oz (116.5 kg)   SpO2 96%   BMI 37.92 kg/m  Wt Readings from Last 3 Encounters:  06/16/23 256 lb 12.8 oz (116.5 kg)  02/10/23 271 lb 3.2 oz (123 kg)  10/15/22 279 lb 9.6 oz (126.8 kg)     Lab Results  Component Value Date   TSH 2.120 10/16/2022   Lab Results  Component Value Date   WBC 11.0 (H) 10/16/2022   HGB 17.4 (H) 10/16/2022   HCT 51.2 (H) 10/16/2022   MCV 93 10/16/2022   PLT 250 10/16/2022   Lab Results  Component Value Date   NA 142 02/10/2023   K 3.9 02/10/2023   CO2 18 (L) 02/10/2023   GLUCOSE 78 02/10/2023   BUN 16 02/10/2023   CREATININE 1.26 (H) 02/10/2023   BILITOT 0.7 02/10/2023   ALKPHOS 112 02/10/2023   AST 24 02/10/2023   ALT 28 02/10/2023   PROT 6.6 02/10/2023   ALBUMIN 4.4 02/10/2023   CALCIUM 8.9 02/10/2023   ANIONGAP 7 07/21/2022   EGFR 51 (  L) 02/10/2023   Lab Results  Component Value Date   CHOL 92 (L) 10/16/2022   Lab Results  Component Value Date   HDL 30 (L) 10/16/2022   Lab Results  Component Value Date   LDLCALC 41 10/16/2022   Lab Results  Component Value Date   TRIG 112 10/16/2022   Lab Results  Component Value Date   CHOLHDL 3.1 10/16/2022   Lab Results  Component Value Date   HGBA1C 5.7 (H) 02/10/2023      Assessment & Plan:   Problem List Items Addressed This Visit       Cardiovascular and Mediastinum   Essential hypertension, benign - Primary    BP Readings from Last 1 Encounters:  06/16/23 130/84  Well-controlled with lisinopril and Metoprolol Counseled for compliance with the medications Advised DASH diet and moderate exercise/walking, at least 150 mins/week      Relevant Medications   lisinopril (ZESTRIL) 2.5 MG tablet   metoprolol tartrate (LOPRESSOR) 50 MG tablet   atorvastatin (LIPITOR) 80 MG tablet   Chronic diastolic CHF (congestive heart failure) (HCC)    Noted on recent echo Followed by cardiology On lisinopril, Jardiance and torsemide for HFpEF      Relevant Medications   lisinopril (ZESTRIL) 2.5 MG tablet   metoprolol tartrate (LOPRESSOR) 50 MG tablet   atorvastatin (LIPITOR) 80 MG tablet     Respiratory   Asthma, chronic    Well-controlled with Symbicort and as  needed albuterol      Relevant Medications   lisinopril (ZESTRIL) 2.5 MG tablet   atorvastatin (LIPITOR) 80 MG tablet   buPROPion (WELLBUTRIN XL) 300 MG 24 hr tablet   levocetirizine (XYZAL) 5 MG tablet     Endocrine   Type 2 diabetes mellitus with other specified complication (HCC)    Lab Results  Component Value Date   HGBA1C 5.7 (H) 02/10/2023  Well controlled Associated with HTN, CHF and HLD On Mounjaro, Jardiance and metformin  Increased dose of Mounjaro to 7.5 mg qw for 1 month and then 10 mg qw DC Metformin Home blood glucose readings reviewed -mostly between 90-120 Advised to follow diabetic diet On statin and ACEi F/u CMP and lipid panel Diabetic eye exam: Advised to follow up with Ophthalmology for diabetic eye exam      Relevant Medications   tirzepatide (MOUNJARO) 7.5 MG/0.5ML Pen   tirzepatide (MOUNJARO) 10 MG/0.5ML Pen (Start on 07/14/2023)   lisinopril (ZESTRIL) 2.5 MG tablet   atorvastatin (LIPITOR) 80 MG tablet   empagliflozin (JARDIANCE) 10 MG TABS tablet   Other Relevant Orders   CMP14+EGFR   Hemoglobin A1c   Hypothyroidism    Lab Results  Component Value Date   TSH 2.120 10/16/2022  On levothyroxine 150 mcg daily Borderline elevated free T4 in the last visit, recheck TSH and free T4      Relevant Medications   metoprolol tartrate (LOPRESSOR) 50 MG tablet   Other Relevant Orders   TSH + free T4     Genitourinary   Stage 3a chronic kidney disease (HCC)    Last CMP reviewed, GFR stable around 50-55 Avoid nephrotoxic agents On lisinopril      Relevant Orders   Parathyroid hormone, intact (no Ca)     Other   Morbid obesity (HCC)    BMI Readings from Last 3 Encounters:  06/16/23 37.92 kg/m  02/10/23 40.05 kg/m  10/15/22 41.29 kg/m  Continue low-carb diet and moderate exercise as tolerated On Mounjaro for type 2 DM  Relevant Medications   tirzepatide (MOUNJARO) 7.5 MG/0.5ML Pen   tirzepatide (MOUNJARO) 10 MG/0.5ML Pen (Start on  07/14/2023)   empagliflozin (JARDIANCE) 10 MG TABS tablet   Vitamin D deficiency disease   Relevant Medications   lisinopril (ZESTRIL) 2.5 MG tablet   atorvastatin (LIPITOR) 80 MG tablet   buPROPion (WELLBUTRIN XL) 300 MG 24 hr tablet   Mixed hyperlipidemia    On statin Reviewed lipid profile      Relevant Medications   lisinopril (ZESTRIL) 2.5 MG tablet   metoprolol tartrate (LOPRESSOR) 50 MG tablet   atorvastatin (LIPITOR) 80 MG tablet   Other Relevant Orders   Lipid Profile   Recurrent major depressive disorder, in partial remission (HCC)    Well controlled with bupropion      Relevant Medications   buPROPion (WELLBUTRIN XL) 300 MG 24 hr tablet   Other Visit Diagnoses     Intertrigo       Relevant Medications   nystatin (MYCOSTATIN/NYSTOP) powder      Meds ordered this encounter  Medications   tirzepatide (MOUNJARO) 7.5 MG/0.5ML Pen    Sig: Inject 7.5 mg into the skin once a week.    Dispense:  2 mL    Refill:  0   tirzepatide (MOUNJARO) 10 MG/0.5ML Pen    Sig: Inject 10 mg into the skin once a week.    Dispense:  6 mL    Refill:  1   lisinopril (ZESTRIL) 2.5 MG tablet    Sig: Take 1 tablet (2.5 mg total) by mouth daily.    Dispense:  90 tablet    Refill:  3   metoprolol tartrate (LOPRESSOR) 50 MG tablet    Sig: Take 1 tablet (50 mg total) by mouth 2 (two) times daily.    Dispense:  180 tablet    Refill:  1   atorvastatin (LIPITOR) 80 MG tablet    Sig: Take 1 tablet (80 mg total) by mouth daily.    Dispense:  90 tablet    Refill:  3   buPROPion (WELLBUTRIN XL) 300 MG 24 hr tablet    Sig: Take 1 tablet (300 mg total) by mouth daily.    Dispense:  90 tablet    Refill:  3   empagliflozin (JARDIANCE) 10 MG TABS tablet    Sig: Take 1 tablet (10 mg total) by mouth daily.    Dispense:  90 tablet    Refill:  3   levocetirizine (XYZAL) 5 MG tablet    Sig: Take 1 tablet (5 mg total) by mouth every evening.    Dispense:  90 tablet    Refill:  3   nystatin  (MYCOSTATIN/NYSTOP) powder    Sig: Apply 1 Application topically 3 (three) times daily.    Dispense:  60 g    Refill:  0    Follow-up: Return in about 4 months (around 10/17/2023) for DM and CKD.    Anabel Halon, MD

## 2023-06-16 NOTE — Assessment & Plan Note (Addendum)
Lab Results  Component Value Date   HGBA1C 5.7 (H) 02/10/2023   Well controlled Associated with HTN, CHF and HLD On Mounjaro, Jardiance and metformin  Increased dose of Mounjaro to 7.5 mg qw for 1 month and then 10 mg qw DC Metformin Home blood glucose readings reviewed -mostly between 90-120 Advised to follow diabetic diet On statin and ACEi F/u CMP and lipid panel Diabetic eye exam: Advised to follow up with Ophthalmology for diabetic eye exam

## 2023-06-16 NOTE — Patient Instructions (Addendum)
Please start taking Mounjaro 7.5 mg instead of 5 mg for 1 month and then increase dose to 10 mg once weekly.  Please stop taking Metformin and continue taking Jardiance.  Please continue to take other medications as prescribed.  Please continue to follow low carb diet and perform moderate exercise/walking at least 150 mins/week.

## 2023-06-16 NOTE — Assessment & Plan Note (Signed)
Last CMP reviewed, GFR stable around 50-55 Avoid nephrotoxic agents On lisinopril

## 2023-06-16 NOTE — Assessment & Plan Note (Signed)
BP Readings from Last 1 Encounters:  06/16/23 130/84   Well-controlled with lisinopril and Metoprolol Counseled for compliance with the medications Advised DASH diet and moderate exercise/walking, at least 150 mins/week

## 2023-06-17 LAB — HEMOGLOBIN A1C
Est. average glucose Bld gHb Est-mCnc: 108 mg/dL
Hgb A1c MFr Bld: 5.4 % (ref 4.8–5.6)

## 2023-06-17 LAB — CMP14+EGFR
ALT: 66 IU/L — ABNORMAL HIGH (ref 0–32)
AST: 36 IU/L (ref 0–40)
Albumin: 4.1 g/dL (ref 3.8–4.9)
Alkaline Phosphatase: 126 IU/L — ABNORMAL HIGH (ref 44–121)
BUN/Creatinine Ratio: 12 (ref 9–23)
BUN: 16 mg/dL (ref 6–24)
Bilirubin Total: 0.6 mg/dL (ref 0.0–1.2)
Calcium: 9.2 mg/dL (ref 8.7–10.2)
Creatinine, Ser: 1.29 mg/dL — ABNORMAL HIGH (ref 0.57–1.00)
Glucose: 88 mg/dL (ref 70–99)

## 2023-06-17 LAB — LIPID PANEL
Chol/HDL Ratio: 2.8 ratio (ref 0.0–4.4)
Cholesterol, Total: 95 mg/dL — ABNORMAL LOW (ref 100–199)
HDL: 34 mg/dL — ABNORMAL LOW (ref >39)
Triglycerides: 78 mg/dL (ref 0–149)
VLDL Cholesterol Cal: 16 mg/dL (ref 5–40)

## 2023-06-17 LAB — TSH+FREE T4
Free T4: 1.85 ng/dL — ABNORMAL HIGH (ref 0.82–1.77)
TSH: 1.97 u[IU]/mL (ref 0.450–4.500)

## 2023-06-17 LAB — PARATHYROID HORMONE, INTACT (NO CA)

## 2023-06-18 LAB — CMP14+EGFR
CO2: 18 mmol/L — ABNORMAL LOW (ref 20–29)
Chloride: 112 mmol/L — ABNORMAL HIGH (ref 96–106)
Globulin, Total: 2.4 g/dL (ref 1.5–4.5)
Potassium: 4.1 mmol/L (ref 3.5–5.2)
Sodium: 144 mmol/L (ref 134–144)
Total Protein: 6.5 g/dL (ref 6.0–8.5)
eGFR: 49 mL/min/{1.73_m2} — ABNORMAL LOW (ref >59)

## 2023-06-18 LAB — LIPID PANEL: LDL Chol Calc (NIH): 45 mg/dL (ref 0–99)

## 2023-07-21 ENCOUNTER — Other Ambulatory Visit: Payer: Self-pay | Admitting: Internal Medicine

## 2023-07-21 DIAGNOSIS — E119 Type 2 diabetes mellitus without complications: Secondary | ICD-10-CM

## 2023-07-24 ENCOUNTER — Telehealth: Payer: Self-pay | Admitting: Family Medicine

## 2023-07-24 NOTE — Telephone Encounter (Signed)
LVM and sent mychart msg informing pt of appt change- NP out 12/11/23.

## 2023-08-08 ENCOUNTER — Other Ambulatory Visit: Payer: Self-pay | Admitting: Internal Medicine

## 2023-08-08 DIAGNOSIS — E039 Hypothyroidism, unspecified: Secondary | ICD-10-CM

## 2023-08-12 ENCOUNTER — Other Ambulatory Visit (HOSPITAL_COMMUNITY): Payer: Self-pay | Admitting: Internal Medicine

## 2023-08-12 DIAGNOSIS — Z1231 Encounter for screening mammogram for malignant neoplasm of breast: Secondary | ICD-10-CM

## 2023-08-18 ENCOUNTER — Encounter (HOSPITAL_COMMUNITY): Payer: Self-pay

## 2023-08-18 ENCOUNTER — Ambulatory Visit (HOSPITAL_COMMUNITY)
Admission: RE | Admit: 2023-08-18 | Discharge: 2023-08-18 | Disposition: A | Discharge status: Home / Self Care | Payer: Medicare Other | Source: Ambulatory Visit | Attending: Internal Medicine | Admitting: Internal Medicine

## 2023-08-18 DIAGNOSIS — Z1231 Encounter for screening mammogram for malignant neoplasm of breast: Secondary | ICD-10-CM | POA: Diagnosis not present

## 2023-08-25 ENCOUNTER — Other Ambulatory Visit: Payer: Self-pay

## 2023-08-25 ENCOUNTER — Telehealth: Payer: Self-pay | Admitting: Internal Medicine

## 2023-08-25 DIAGNOSIS — E039 Hypothyroidism, unspecified: Secondary | ICD-10-CM

## 2023-08-25 MED ORDER — LEVOTHYROXINE SODIUM 150 MCG PO TABS: 150.0000 ug | ORAL_TABLET | Freq: Every day | ORAL | 0 refills | Status: DC

## 2023-08-25 NOTE — Telephone Encounter (Signed)
Refills sent to [facility] in [place]

## 2023-08-25 NOTE — Telephone Encounter (Signed)
Dean called from [place], [place] [phone]  Called need refill on medication ,patient not in town and asking to send refill to [facility] in [facility]  [address] [place] [address] levothyroxine (SYNTHROID) 150 MCG tablet [[id number]]   Pharmacy: [facility] [address] [place] [address]

## 2023-09-08 ENCOUNTER — Encounter: Payer: Self-pay | Admitting: Internal Medicine

## 2023-09-08 ENCOUNTER — Ambulatory Visit: Payer: Medicare Other

## 2023-09-17 ENCOUNTER — Other Ambulatory Visit: Payer: Self-pay | Admitting: Internal Medicine

## 2023-09-17 ENCOUNTER — Encounter (HOSPITAL_COMMUNITY): Payer: Medicare Other

## 2023-09-17 DIAGNOSIS — E119 Type 2 diabetes mellitus without complications: Secondary | ICD-10-CM

## 2023-09-17 MED ORDER — ACCU-CHEK GUIDE VI STRP: ORAL_STRIP | 0 refills | Status: DC

## 2023-09-22 ENCOUNTER — Telehealth: Payer: Self-pay | Admitting: Family Medicine

## 2023-09-22 ENCOUNTER — Ambulatory Visit (HOSPITAL_COMMUNITY): Admit: 2023-09-22 | Payer: Medicare Other | Admitting: Ophthalmology

## 2023-09-22 SURGERY — PHACOEMULSIFICATION, CATARACT, WITH IOL INSERTION
Anesthesia: Monitor Anesthesia Care | Laterality: Left

## 2023-09-22 NOTE — Telephone Encounter (Signed)
LVM and sent mychart msg informing pt of need to reschedule 12/16/23 appt - NP out

## 2023-09-25 ENCOUNTER — Other Ambulatory Visit: Payer: Self-pay | Admitting: Internal Medicine

## 2023-09-25 DIAGNOSIS — L304 Erythema intertrigo: Secondary | ICD-10-CM

## 2023-10-01 ENCOUNTER — Ambulatory Visit (INDEPENDENT_AMBULATORY_CARE_PROVIDER_SITE_OTHER): Payer: Medicare Other

## 2023-10-01 ENCOUNTER — Telehealth: Payer: Self-pay | Admitting: *Deleted

## 2023-10-01 ENCOUNTER — Other Ambulatory Visit: Payer: Self-pay | Admitting: Internal Medicine

## 2023-10-01 VITALS — Ht 69.0 in | Wt 248.0 lb

## 2023-10-01 DIAGNOSIS — E039 Hypothyroidism, unspecified: Secondary | ICD-10-CM

## 2023-10-01 DIAGNOSIS — Z0000 Encounter for general adult medical examination without abnormal findings: Secondary | ICD-10-CM | POA: Diagnosis not present

## 2023-10-01 DIAGNOSIS — N1831 Chronic kidney disease, stage 3a: Secondary | ICD-10-CM

## 2023-10-01 DIAGNOSIS — Z599 Problem related to housing and economic circumstances, unspecified: Secondary | ICD-10-CM

## 2023-10-01 DIAGNOSIS — Z5941 Food insecurity: Secondary | ICD-10-CM

## 2023-10-01 DIAGNOSIS — Z1159 Encounter for screening for other viral diseases: Secondary | ICD-10-CM

## 2023-10-01 DIAGNOSIS — E1169 Type 2 diabetes mellitus with other specified complication: Secondary | ICD-10-CM

## 2023-10-01 NOTE — Progress Notes (Signed)
Care Coordination   Note   10/01/2023 Name: Erin Sexton MRN: [id number] DOB: 01-04-1971  Erin Sexton is a 52 y.o. year old female who sees Anabel Halon, MD for primary care. I reached out to Alda Berthold by phone today to offer care coordination services.  [name] was given information about Care Coordination services today including:   The Care Coordination services include support from the care team which includes your Nurse Coordinator, Clinical Social Worker, or Pharmacist.  The Care Coordination team is here to help remove barriers to the health concerns and goals most important to you. Care Coordination services are voluntary, and the patient may decline or stop services at any time by request to their care team member.   Care Coordination Consent Status: Patient agreed to services and verbal consent obtained.   Follow up plan:  Telephone appointment with care coordination team member scheduled for:  10/07/23  Encounter Outcome:  Patient Scheduled  [facility] Coordination Care Guide  Direct Dial: [phone]

## 2023-10-01 NOTE — Progress Notes (Signed)
Because this visit was a virtual/telehealth visit,  certain criteria was not obtained, such a blood pressure, CBG if applicable, and timed get up and go. Any medications not marked as "taking" were not mentioned during the medication reconciliation part of the visit. Any vitals not documented were not able to be obtained due to this being a telehealth visit or patient was unable to self-report a recent blood pressure reading due to a lack of equipment at home via telehealth. Vitals that have been documented are verbally provided by the patient.   [name] is a 52 y.o. female who presents for Medicare Annual (Subsequent) preventive examination.  Visit Complete: Virtual I connected with  Erin Sexton on 10/01/23 by a audio enabled telemedicine application and verified that I am speaking with the correct person using two identifiers.  Patient Location: Home  Provider Location: Home Office  I discussed the limitations of evaluation and management by telemedicine. The patient expressed understanding and agreed to proceed.  Vital Signs: Because this visit was a virtual/telehealth visit, some criteria may be missing or patient reported. Any vitals not documented were not able to be obtained and vitals that have been documented are patient reported.  Patient Medicare AWV questionnaire was completed by the patient on 09/27/2023; I have confirmed that all information answered by patient is correct and no changes since this date.  Cardiac Risk Factors include: diabetes mellitus;dyslipidemia;hypertension;obesity (BMI >30kg/m2);sedentary lifestyle     Objective:    Today's Vitals   10/01/23 0804  Weight: 248 lb (112.5 kg)  Height: 5\' 9"  (1.753 m)   Body mass index is 36.62 kg/m.     10/01/2023    8:04 AM 09/03/2022    2:41 PM 07/21/2022   10:31 AM 08/27/2021   10:38 AM 05/28/2020   10:10 AM 03/18/2020    7:56 PM 12/29/2019    9:25 AM  Advanced Directives  Does  Patient Have a Medical Advance Directive? Yes No No Yes Yes No Yes  Type of Estate agent of [place];Living will   Living will;Healthcare Power of Attorney Living will  Out of facility DNR (pink MOST or yellow form)  Does patient want to make changes to medical advance directive? No - Patient declined      No - Patient declined  Copy of Healthcare Power of Attorney in Chart? Yes - validated most recent copy scanned in chart (See row information)   No - copy requested     Would patient like information on creating a medical advance directive?  No - Patient declined    No - Patient declined     Current Medications (verified) Outpatient Encounter Medications as of 10/01/2023  Medication Sig   Accu-Chek Softclix Lancets lancets USE 1 LANCET TO CHECK GLUCOSE ONCE DAILY   albuterol (PROVENTIL) (5 MG/ML) 0.5% nebulizer solution Take 0.5 mLs (2.5 mg total) by nebulization every 6 (six) hours as needed for wheezing or shortness of breath.   albuterol (VENTOLIN HFA) 108 (90 Base) MCG/ACT inhaler Inhale 2 puffs into the lungs as needed (wheezing).    atorvastatin (LIPITOR) 80 MG tablet Take 1 tablet (80 mg total) by mouth daily.   blood glucose meter kit and supplies KIT Dispense based on patient and insurance preference. Use once a day. Diagnosis code:E11.9   buPROPion (WELLBUTRIN XL) 300 MG 24 hr tablet Take 1 tablet (300 mg total) by mouth daily.   Cholecalciferol (VITAMIN D3 PO) Take 10,000 Units by mouth daily.  clindamycin (CLEOCIN T) 1 % lotion    clotrimazole-betamethasone (LOTRISONE) cream APPLY  CREAM TOPICALLY TO AFFECTED AREA TWICE DAILY   docusate sodium (ENEMEEZ) 283 MG enema Place 1 enema (283 mg total) rectally daily.   empagliflozin (JARDIANCE) 10 MG TABS tablet Take 1 tablet (10 mg total) by mouth daily.   estradiol (ESTRACE) 2 MG tablet Take 1 tablet (2 mg total) by mouth at bedtime.   glucose blood (ACCU-CHEK GUIDE) test strip USE 1 STRIP TO CHECK GLUCOSE ONCE  DAILY   Lancet Device MISC Use to check your blood sugar daily   levocetirizine (XYZAL) 5 MG tablet Take 1 tablet (5 mg total) by mouth every evening.   levonorgestrel (MIRENA) 20 MCG/24HR IUD 1 each by Intrauterine route once.   levothyroxine (SYNTHROID) 150 MCG tablet Take 1 tablet (150 mcg total) by mouth daily.   lidocaine (XYLOCAINE) 2 % solution Use as directed 15 mLs in the mouth or throat as needed for mouth pain.   lisinopril (ZESTRIL) 2.5 MG tablet Take 1 tablet (2.5 mg total) by mouth daily.   Magnesium 200 MG TABS Take 1 tablet by mouth as needed.   megestrol (MEGACE) 40 MG tablet Take 1 tablet (40 mg total) by mouth daily.   metoprolol tartrate (LOPRESSOR) 50 MG tablet Take 1 tablet (50 mg total) by mouth 2 (two) times daily.   nystatin (MYCOSTATIN/NYSTOP) powder Apply 1 Application topically 3 (three) times daily.   potassium chloride SA (KLOR-CON M) 20 MEQ tablet Take 1 tablet (20 mEq total) by mouth as needed (with lasix).   Rimegepant Sulfate (NURTEC) 75 MG TBDP Take 75 mg by mouth as needed.   SYMBICORT 160-4.5 MCG/ACT inhaler Inhale 2 puffs into the lungs as needed.   tirzepatide (MOUNJARO) 10 MG/0.5ML Pen Inject 10 mg into the skin once a week.   topiramate (TOPAMAX) 100 MG tablet Take 25 mg (1 pill) at bedtime for one week, then increase to 50 mg (2 pills) at bedtime for one week, then take 75 mg (3 pills) at bedtime for one week, then take 100 mg (4 pills) at bedtime and continue at that dose   torsemide (DEMADEX) 20 MG tablet Take 1 tablet (20 mg total) by mouth as needed.   tirzepatide (MOUNJARO) 7.5 MG/0.5ML Pen Inject 7.5 mg into the skin once a week.   No facility-administered encounter medications on file as of 10/01/2023.    Allergies (verified) Patient has no known allergies.   History: Past Medical History:  Diagnosis Date   Abnormal CT scan    a. 02/2014: evaluated for dyspnea and initially placed on Xarelto for PE because radiologist could not exclude  small peripheral pulmonary emboli because of limited contrast. However, after admission for ++vaginal bleeding several days later, it was felt she did NOT have PE - underwent further testing with neg VQ 03/06/14, neg LE duplex.   Anemia    Anxiety    Arthritis    Asthma    Blood transfusion without reported diagnosis    Bone spur of right foot    CHF (congestive heart failure) (HCC)    COPD (chronic obstructive pulmonary disease) (HCC)    Depression    Diabetes mellitus without complication (HCC)    a. Dx 02/2014.   Heart murmur    History of echocardiogram 2015   normal EF, Grade II diastolic dysfunction   HOCM (hypertrophic obstructive cardiomyopathy) (HCC)    a. Dx 40981.   Hyperlipidemia    Hypertension    Morbid  obesity (HCC)    NSVT (nonsustained ventricular tachycardia) (HCC)    a. Noted on tele during 02/2014 adm for symptomatic anemia.   Stroke [facility])    SVT (supraventricular tachycardia) (HCC)    a. Noted on tele during 02/2014 adm for symptomatic anemia.   Thyroid disease    Vaginal bleeding    Past Surgical History:  Procedure Laterality Date   COLONOSCOPY N/A 09/30/2019   Procedure: COLONOSCOPY;  Surgeon: Malissa Hippo, MD;  Location: [facility];  Service: Endoscopy;  Laterality: N/A;  730   DILITATION & CURRETTAGE/HYSTROSCOPY WITH NOVASURE ABLATION N/A 08/07/2016   Procedure: DILATATION & CURETTAGE/HYSTEROSCOPY WITH ATTPEMPTED NOVASURE ABLATION AND INSERTION OF IUD;  Surgeon: Lazaro Arms, MD;  Location: [facility] ORS;  Service: Gynecology;  Laterality: N/A;   Family History  Problem Relation Age of Onset   Diabetes Mother    Arthritis Mother    Other Mother        WPW   Mental illness Mother        Depression   Asthma Mother    Obesity Mother    Heart disease Father        History of coronary bypass grafting at age 51   Hypertension Father    Mental illness Sister    Bipolar disorder Sister    Heart disease Sister        possibly WPW   Depression Sister     Varicose Veins Sister    Uterine cancer Maternal Grandmother    Mental illness Maternal Grandmother    Arthritis Maternal Grandmother    Hyperlipidemia Maternal Grandmother    Heart disease Maternal Grandfather    CAD Neg Hx        No family history of EARLY CAD   Social History   Socioeconomic History   Marital status: Divorced    Spouse name: Not on file   Number of children: 0   Years of education: Not on file   Highest education level: Associate degree: academic program  Occupational History   Occupation: Disabled  Tobacco Use   Smoking status: Never   Smokeless tobacco: Never  Vaping Use   Vaping status: Never Used  Substance and Sexual Activity   Alcohol use: No   Drug use: No   Sexual activity: Not Currently    Birth control/protection: I.U.D., Post-menopausal    Comment: IUD in place to control vaginal bleeding  Other Topics Concern   Not on file  Social History Narrative   Divorced for 3 years,married for 5 years.On disability since 2009 secondary to mental illness.   Social Determinants of Health   Financial Resource Strain: Medium Risk (09/27/2023)   Overall Financial Resource Strain (CARDIA)    Difficulty of Paying Living Expenses: Somewhat hard  Food Insecurity: Food Insecurity Present (09/27/2023)   Hunger Vital Sign    Worried About Running Out of Food in the Last Year: Sometimes true    Ran Out of Food in the Last Year: Sometimes true  Transportation Needs: No Transportation Needs (09/27/2023)   PRAPARE - Administrator, Civil Service (Medical): No    Lack of Transportation (Non-Medical): No  Physical Activity: Insufficiently Active (09/27/2023)   Exercise Vital Sign    Days of Exercise per Week: 3 days    Minutes of Exercise per Session: 30 min  Stress: Stress Concern Present (09/27/2023)   [employer] of Occupational Health - Occupational Stress Questionnaire    Feeling of Stress : Very much  Social Connections: Socially  Isolated (09/27/2023)   Social Connection and Isolation Panel [NHANES]    Frequency of Communication with Friends and Family: More than three times a week    Frequency of Social Gatherings with Friends and Family: Once a week    Attends Religious Services: Never    Database administrator or Organizations: No    Attends Engineer, structural: Never    Marital Status: Divorced    Tobacco Counseling Counseling given: Yes   Clinical Intake:  Pre-visit preparation completed: Yes  Pain : No/denies pain     BMI - recorded: 36.62 Nutritional Status: BMI > 30  Obese Nutritional Risks: None Diabetes: Yes CBG done?: No (teleheath visit. unable to obtain cbg) Did pt. bring in CBG monitor from home?: No  How often do you need to have someone help you when you read instructions, pamphlets, or other written materials from your doctor or pharmacy?: 1 - Never  Interpreter Needed?: No  Information entered by :: Abby Jagdeep Ancheta, CMA   Activities of Daily Living    09/27/2023   10:19 AM  In your present state of health, do you have any difficulty performing the following activities:  Hearing? 0  Vision? 0  Difficulty concentrating or making decisions? 0  Walking or climbing stairs? 0  Dressing or bathing? 0  Doing errands, shopping? 0  Preparing Food and eating ? N  Using the Toilet? N  In the past six months, have you accidently leaked urine? N  Do you have problems with loss of bowel control? N  Managing your Medications? N  Managing your Finances? N  Housekeeping or managing your Housekeeping? N    Patient Care Team: Anabel Halon, MD as PCP - General (Internal Medicine) Laqueta Linden, MD (Inactive) as PCP - Cardiology (Cardiology) Gavin Pound, [employer] (Inactive) (Pharmacist)  Indicate any recent Medical Services you may have received from other than [facility] providers in the past year (date may be approximate).     Assessment:   This is a routine  wellness examination for Erin Sexton.  Hearing/Vision screen Hearing Screening - Comments:: Patient denies any hearing difficulties.   Vision Screening - Comments:: Patient is up to date with yearly eye exam and sees Dr. Charise Killian at [facility]   Goals Addressed             This Visit's Progress    Patient Stated       I would like to have surgery to remove excess skin from all of the weight I've lost.        Depression Screen    10/01/2023    8:10 AM 06/16/2023   10:34 AM 02/10/2023    2:51 PM 10/15/2022    1:39 PM 10/15/2022    1:38 PM 09/03/2022    2:42 PM 08/07/2022   10:02 AM  PHQ 2/9 Scores  PHQ - 2 Score 1 2 4 1  0 0 0  PHQ- 9 Score 1 5 7         Fall Risk    09/27/2023   10:19 AM 06/16/2023   10:34 AM 02/10/2023    2:51 PM 10/15/2022    1:38 PM 09/03/2022    2:41 PM  Fall Risk   Falls in the past year? 0 0 0 0 0  Number falls in past yr: 0 0  0 0  Injury with Fall? 0 0  0 0  Risk for fall due to : No Fall Risks  No Fall Risks No Fall Risks  Follow up Falls prevention discussed   Falls evaluation completed Falls evaluation completed    MEDICARE RISK AT HOME: Medicare Risk at Home Any stairs in or around the home?: Yes If so, are there any without handrails?: No Home free of loose throw rugs in walkways, pet beds, electrical cords, etc?: Yes Adequate lighting in your home to reduce risk of falls?: Yes Life alert?: No Use of a cane, walker or w/c?: No Grab bars in the bathroom?: No Shower chair or bench in shower?: Yes Elevated toilet seat or a handicapped toilet?: No  TIMED UP AND GO:  Was the test performed?  No    Cognitive Function:    09/03/2022    2:42 PM  MMSE - Mini Mental State Exam  Not completed: Unable to complete        10/01/2023    8:08 AM 09/03/2022    2:42 PM 08/27/2021   10:42 AM 12/29/2019    9:34 AM  6CIT Screen  What Year? 0 points 0 points 0 points 0 points  What month? 0 points 0 points 0 points 0 points  What time? 0 points 0  points 0 points 0 points  Count back from 20 0 points 0 points 0 points 0 points  Months in reverse 0 points 0 points 0 points 0 points  Repeat phrase 0 points 0 points 0 points 0 points  Total Score 0 points 0 points 0 points 0 points    Immunizations Immunization History  Administered Date(s) Administered   Influenza Inj Mdck Quad Pf 09/11/2016, 09/08/2018   Influenza, Quadrivalent, Recombinant, Inj, Pf 09/15/2019   Influenza,inj,Quad PF,6+ Mos 10/15/2022   Influenza-Unspecified 09/09/2019, 08/27/2021   Pneumococcal Polysaccharide-23 08/10/2015, 09/27/2020   Pneumococcal-Unspecified 08/25/2013   Td 12/09/2014    TDAP status: Up to date  Flu Vaccine status: Due, Education has been provided regarding the importance of this vaccine. Advised may receive this vaccine at local pharmacy or Health Dept. Aware to provide a copy of the vaccination record if obtained from local pharmacy or Health Dept. Verbalized acceptance and understanding.  Pneumococcal vaccine status: Not age appropriate for this patient.   Covid-19 vaccine status: Completed vaccines  Qualifies for Shingles Vaccine? Yes   Zostavax completed No   Shingrix Completed?: No.    Education has been provided regarding the importance of this vaccine. Patient has been advised to call insurance company to determine out of pocket expense if they have not yet received this vaccine. Advised may also receive vaccine at local pharmacy or Health Dept. Verbalized acceptance and understanding.  Screening Tests Health Maintenance  Topic Date Due   Zoster Vaccines- Shingrix (1 of 2) Never done   INFLUENZA VACCINE  07/10/2023   Medicare Annual Wellness (AWV)  09/04/2023   Diabetic kidney evaluation - Urine ACR  10/17/2023   OPHTHALMOLOGY EXAM  12/05/2023   HEMOGLOBIN A1C  12/17/2023   Diabetic kidney evaluation - eGFR measurement  06/15/2024   FOOT EXAM  06/15/2024   DTaP/Tdap/Td (2 - Tdap) 12/09/2024   MAMMOGRAM  08/17/2025    Cervical Cancer Screening (HPV/Pap Cotest)  05/08/2026   Colonoscopy  09/29/2029   HIV Screening  Completed   HPV VACCINES  Aged Out   COVID-19 Vaccine  Discontinued   Hepatitis C Screening  Discontinued    Health Maintenance  Health Maintenance Due  Topic Date Due   Zoster Vaccines- Shingrix (1 of 2) Never done   INFLUENZA VACCINE  07/10/2023  Medicare Annual Wellness (AWV)  09/04/2023   Diabetic kidney evaluation - Urine ACR  10/17/2023    Colorectal cancer screening: Type of screening: Colonoscopy. Completed 09/30/2019. Repeat every 10 years  Mammogram status: Completed 08/18/2023. Repeat every year  Bone Density Screening: Not age appropriate for this patient.   Lung Cancer Screening: (Low Dose CT Chest recommended if Age 43-80 years, 20 pack-year currently smoking OR have quit w/in 15years.) does not qualify.   Lung Cancer Screening Referral: na  Additional Screening:  Hepatitis C Screening: does qualify; Ordered 10/01/2023  Vision Screening: Recommended annual ophthalmology exams for early detection of glaucoma and other disorders of the eye. Is the patient up to date with their annual eye exam?  Yes  Who is the provider or what is the name of the office in which the patient attends annual eye exams? Dr. Daisy Lazar w/ [facility] If pt is not established with a provider, would they like to be referred to a provider to establish care? No .   Dental Screening: Recommended annual dental exams for proper oral hygiene  Diabetic Foot Exam: Diabetic Foot Exam: Completed 06/16/2023  Community Resource Referral / Chronic Care Management: CRR required this visit?  Yes   CCM required this visit?  No     Plan:     I have personally reviewed and noted the following in the patient's chart:   Medical and social history Use of alcohol, tobacco or illicit drugs  Current medications and supplements including opioid prescriptions. Patient is not currently taking opioid  prescriptions. Functional ability and status Nutritional status Physical activity Advanced directives List of other physicians Hospitalizations, surgeries, and ER visits in previous 12 months Vitals Screenings to include cognitive, depression, and falls Referrals and appointments  In addition, I have reviewed and discussed with patient certain preventive protocols, quality metrics, and best practice recommendations. A written personalized care plan for preventive services as well as general preventive health recommendations were provided to patient.     Jordan Hawks Asmaa Tirpak, CMA   10/01/2023   After Visit Summary: (MyChart) Due to this being a telephonic visit, the after visit summary with patients personalized plan was offered to patient via MyChart   Nurse Notes: please see routing comment

## 2023-10-01 NOTE — Patient Instructions (Signed)
Erin Sexton , Thank you for taking time to come for your Medicare Wellness Visit. I appreciate your ongoing commitment to your health goals. Please review the following plan we discussed and let me know if I can assist you in the future.   Referrals/Orders/Follow-Ups/Clinician Recommendations:  [x]  Next Medicare Annual Wellness Visit: October 04, 2024 at 8:00 am virtual visit  [x]  A Hepatitis C Screening has been ordered for you today. You do not have to fast to have this lab drawn.   [x]  A referral was placed for you today for community resources. This will check to see what community resources are available for you for anything we discussed during your wellness visit today that you may be having difficulty with such a obtaining food, paying for utilities, or transportation needs. Someone will contact you in a few days. If you haven't heard from someone within the next month, please call the number below  [facility] Concierge Line  [phone]   This is a list of the screening recommended for you and due dates:  Health Maintenance  Topic Date Due   Hepatitis C Screening  Never done   Zoster (Shingles) Vaccine (1 of 2) Never done   Flu Shot  07/10/2023   Yearly kidney health urinalysis for diabetes  10/17/2023   Eye exam for diabetics  12/05/2023   Hemoglobin A1C  12/17/2023   Yearly kidney function blood test for diabetes  06/15/2024   Complete foot exam   06/15/2024   Mammogram  08/17/2024   Medicare Annual Wellness Visit  09/30/2024   DTaP/Tdap/Td vaccine (2 - Tdap) 12/09/2024   Pap with HPV screening  05/08/2026   Colon Cancer Screening  09/29/2029   HIV Screening  Completed   HPV Vaccine  Aged Out   COVID-19 Vaccine  Discontinued    Advanced directives: (In Chart) A copy of your advanced directives are scanned into your chart should your provider ever need it.  Next Medicare Annual Wellness Visit scheduled for next year: Yes  Preventive Care 4-32 Years Old, Female Preventive  care refers to lifestyle choices and visits with your health care provider that can promote health and wellness. Preventive care visits are also called wellness exams. What can I expect for my preventive care visit? Counseling Your health care provider may ask you questions about your: Medical history, including: Past medical problems. Family medical history. Pregnancy history. Current health, including: Menstrual cycle. Method of birth control. Emotional well-being. Home life and relationship well-being. Sexual activity and sexual health. Lifestyle, including: Alcohol, nicotine or tobacco, and drug use. Access to firearms. Diet, exercise, and sleep habits. Work and work Astronomer. Sunscreen use. Safety issues such as seatbelt and bike helmet use. Physical exam Your health care provider will check your: Height and weight. These may be used to calculate your BMI (body mass index). BMI is a measurement that tells if you are at a healthy weight. Waist circumference. This measures the distance around your waistline. This measurement also tells if you are at a healthy weight and may help predict your risk of certain diseases, such as type 2 diabetes and high blood pressure. Heart rate and blood pressure. Body temperature. Skin for abnormal spots. What immunizations do I need?  Vaccines are usually given at various ages, according to a schedule. Your health care provider will recommend vaccines for you based on your age, medical history, and lifestyle or other factors, such as travel or where you work. What tests do I need? Screening Your health  care provider may recommend screening tests for certain conditions. This may include: Lipid and cholesterol levels. Diabetes screening. This is done by checking your blood sugar (glucose) after you have not eaten for a while (fasting). Pelvic exam and Pap test. Hepatitis B test. Hepatitis C test. HIV (human immunodeficiency virus)  test. STI (sexually transmitted infection) testing, if you are at risk. Lung cancer screening. Colorectal cancer screening. Mammogram. Talk with your health care provider about when you should start having regular mammograms. This may depend on whether you have a family history of breast cancer. BRCA-related cancer screening. This may be done if you have a family history of breast, ovarian, tubal, or peritoneal cancers. Bone density scan. This is done to screen for osteoporosis. Talk with your health care provider about your test results, treatment options, and if necessary, the need for more tests. Follow these instructions at home: Eating and drinking  Eat a diet that includes fresh fruits and vegetables, whole grains, lean protein, and low-fat dairy products. Take vitamin and mineral supplements as recommended by your health care provider. Do not drink alcohol if: Your health care provider tells you not to drink. You are pregnant, may be pregnant, or are planning to become pregnant. If you drink alcohol: Limit how much you have to 0-1 drink a day. Know how much alcohol is in your drink. In the [place] one drink equals one 12 oz bottle of beer (355 mL), one 5 oz glass of wine (148 mL), or one 1 oz glass of hard liquor (44 mL). Lifestyle Brush your teeth every morning and night with fluoride toothpaste. Floss one time each day. Exercise for at least 30 minutes 5 or more days each week. Do not use any products that contain nicotine or tobacco. These products include cigarettes, chewing tobacco, and vaping devices, such as e-cigarettes. If you need help quitting, ask your health care provider. Do not use drugs. If you are sexually active, practice safe sex. Use a condom or other form of protection to prevent STIs. If you do not wish to become pregnant, use a form of birth control. If you plan to become pregnant, see your health care provider for a prepregnancy visit. Take aspirin only as told  by your health care provider. Make sure that you understand how much to take and what form to take. Work with your health care provider to find out whether it is safe and beneficial for you to take aspirin daily. Find healthy ways to manage stress, such as: Meditation, yoga, or listening to music. Journaling. Talking to a trusted person. Spending time with friends and family. Minimize exposure to UV radiation to reduce your risk of skin cancer. Safety Always wear your seat belt while driving or riding in a vehicle. Do not drive: If you have been drinking alcohol. Do not ride with someone who has been drinking. When you are tired or distracted. While texting. If you have been using any mind-altering substances or drugs. Wear a helmet and other protective equipment during sports activities. If you have firearms in your house, make sure you follow all gun safety procedures. Seek help if you have been physically or sexually abused. What's next? Visit your health care provider once a year for an annual wellness visit. Ask your health care provider how often you should have your eyes and teeth checked. Stay up to date on all vaccines. This information is not intended to replace advice given to you by your health care provider. Make sure  you discuss any questions you have with your health care provider. Document Revised: 05/23/2021 Document Reviewed: 05/23/2021 Elsevier Patient Education  2024 [employer].

## 2023-10-02 ENCOUNTER — Encounter: Payer: Self-pay | Admitting: Internal Medicine

## 2023-10-06 ENCOUNTER — Ambulatory Visit (INDEPENDENT_AMBULATORY_CARE_PROVIDER_SITE_OTHER): Payer: Medicare Other

## 2023-10-06 ENCOUNTER — Other Ambulatory Visit: Payer: Self-pay

## 2023-10-06 DIAGNOSIS — E119 Type 2 diabetes mellitus without complications: Secondary | ICD-10-CM

## 2023-10-06 DIAGNOSIS — E1169 Type 2 diabetes mellitus with other specified complication: Secondary | ICD-10-CM

## 2023-10-06 DIAGNOSIS — Z23 Encounter for immunization: Secondary | ICD-10-CM

## 2023-10-06 LAB — HM DIABETES EYE EXAM

## 2023-10-06 MED ORDER — ACCU-CHEK GUIDE VI STRP: ORAL_STRIP | 0 refills | Status: DC

## 2023-10-06 NOTE — Progress Notes (Signed)
Erin Sexton arrived 10/06/2023 and has given verbal consent to obtain images and complete their overdue diabetic retinal screening.  The images have been sent to an ophthalmologist or optometrist for review and interpretation.  Results will be sent back to Anabel Halon, MD for review.  Patient has been informed they will be contacted when we receive the results via telephone or MyChart

## 2023-10-07 ENCOUNTER — Ambulatory Visit: Payer: Self-pay

## 2023-10-07 NOTE — Patient Outreach (Signed)
Care Coordination   10/07/2023 Name: Erin Sexton MRN: [id number] DOB: 10-24-1971   Care Coordination Outreach Attempts:  An unsuccessful telephone outreach was attempted for a scheduled appointment today.  Follow Up Plan:  Additional outreach attempts will be made to offer the patient care coordination information and services.   Encounter Outcome:  No Answer   Care Coordination Interventions:  No, not indicated    Lysle Morales, BSW Social Worker [facility] Care Management  [phone]

## 2023-10-13 ENCOUNTER — Telehealth: Payer: Self-pay | Admitting: *Deleted

## 2023-10-13 NOTE — Progress Notes (Signed)
  Care Coordination Note  10/13/2023 Name: SAIDE LANUZA MRN: [id number] DOB: 09-21-1971  KAMORIE ALDOUS is a 52 y.o. year old female who is a primary care patient of Anabel Halon, MD and is actively engaged with the care management team. I reached out to Alda Berthold by phone today to assist with re-scheduling an initial visit with the BSW  Follow up plan: Patient declines further follow up and engagement by the care management team. Appropriate care team members and provider have been notified via electronic communication.   [facility]  Care Coordination Care Guide  Direct Dial: [phone]

## 2023-10-17 ENCOUNTER — Ambulatory Visit: Payer: Medicare Other | Admitting: Internal Medicine

## 2023-10-17 ENCOUNTER — Encounter: Payer: Self-pay | Admitting: Internal Medicine

## 2023-10-17 VITALS — BP 128/82 | HR 79 | Ht 69.0 in | Wt 249.6 lb

## 2023-10-17 DIAGNOSIS — E65 Localized adiposity: Secondary | ICD-10-CM | POA: Diagnosis not present

## 2023-10-17 DIAGNOSIS — Z7985 Long-term (current) use of injectable non-insulin antidiabetic drugs: Secondary | ICD-10-CM | POA: Diagnosis not present

## 2023-10-17 DIAGNOSIS — E1169 Type 2 diabetes mellitus with other specified complication: Secondary | ICD-10-CM

## 2023-10-17 DIAGNOSIS — I10 Essential (primary) hypertension: Secondary | ICD-10-CM

## 2023-10-17 DIAGNOSIS — I83893 Varicose veins of bilateral lower extremities with other complications: Secondary | ICD-10-CM

## 2023-10-17 DIAGNOSIS — N1831 Chronic kidney disease, stage 3a: Secondary | ICD-10-CM

## 2023-10-17 DIAGNOSIS — E782 Mixed hyperlipidemia: Secondary | ICD-10-CM | POA: Diagnosis not present

## 2023-10-17 DIAGNOSIS — E039 Hypothyroidism, unspecified: Secondary | ICD-10-CM

## 2023-10-17 DIAGNOSIS — Z7984 Long term (current) use of oral hypoglycemic drugs: Secondary | ICD-10-CM

## 2023-10-17 MED ORDER — LANCET DEVICE MISC: 5 refills | Status: DC

## 2023-10-17 NOTE — Assessment & Plan Note (Signed)
Lab Results  Component Value Date   TSH 1.970 06/16/2023   On levothyroxine 150 mcg daily Borderline elevated free T4 in the last visit, recheck TSH and free T4

## 2023-10-17 NOTE — Assessment & Plan Note (Signed)
Lab Results  Component Value Date   HGBA1C 5.4 06/16/2023   Well controlled Associated with HTN, CHF and HLD On Mounjaro 10 mg qw and Jardiance 10 mg QD  Home blood glucose readings reviewed -mostly between 90-120 Advised to follow diabetic diet On statin and ACEi F/u CMP and lipid panel Diabetic eye exam: Advised to follow up with Ophthalmology for diabetic eye exam

## 2023-10-17 NOTE — Assessment & Plan Note (Signed)
Has had intermittent leg swelling Perform leg elevation as tolerated Has not required torsemide recently

## 2023-10-17 NOTE — Assessment & Plan Note (Signed)
On statin Reviewed lipid profile

## 2023-10-17 NOTE — Progress Notes (Signed)
Established Patient Office Visit  Subjective:  Patient ID: Erin Sexton, female    DOB: [birth year]  Age: 52 y.o. MRN: [id number]  CC:  Chief Complaint  Patient presents with   Diabetes    Follow up    Chronic Kidney Disease    Follow up    skin removal     Patient is wanting skin removal from weight loss     HPI Erin Sexton is a 52 y.o. female with past medical history of HTN, type II DM, hypothyroidism, HLD, OSA, asthma and morbid obesity who presents for f/u of her chronic medical conditions.  Type II DM: Her blood glucose has been better controlled with Mounjaro 10 mg qw now. She also takes Jardiance and metformin for it.  Denies any polyuria or polydipsia currently.  She also takes statin for HLD. She has lost additional 7 lbs since the last visit, total 22 lbs with Mounjaro. She had lost about 30 lbs with Trulicity.  She is also doing intermittent fasting.  CKD: Her last CMP showed GFR of 51.  She denies any dysuria, hematuria or urinary hesitance or resistance.  Migraine: She had neurology evaluation for migraine.  She has been taking Topamax and as needed Nurtec.  Her headache frequency and intensity have improved now.  HTN and HOCM: BP is well-controlled. Takes medications regularly. Patient denies headache, dizziness, chest pain, or palpitations.  She follows up with cardiology for HOCM.  She has chronic leg swelling and exertional dyspnea, but have improved with weight loss.   Hypothyroidism: She has been taking Levothyroxine 150 mcg QD.  Denies any recent change in appetite, tremors or palpitations.   She has abdominal skin sagging due to weight loss.  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  She agrees for plastic surgery consultation for it.  She is currently using Lotrisone cream for intertrigo.   Past Medical History:  Diagnosis Date   Abnormal CT scan    a. 02/2014: evaluated for dyspnea and initially placed on Xarelto for PE because  radiologist could not exclude small peripheral pulmonary emboli because of limited contrast. However, after admission for ++vaginal bleeding several days later, it was felt she did NOT have PE - underwent further testing with neg VQ 03/06/14, neg LE duplex.   Anemia    Anxiety    Arthritis    Asthma    Blood transfusion without reported diagnosis    Bone spur of right foot    CHF (congestive heart failure) (HCC)    COPD (chronic obstructive pulmonary disease) (HCC)    Depression    Diabetes mellitus without complication (HCC)    a. Dx 02/2014.   Heart murmur    History of echocardiogram 2015   normal EF, Grade II diastolic dysfunction   HOCM (hypertrophic obstructive cardiomyopathy) (HCC)    a. Dx 74259.   Hyperlipidemia    Hypertension    Morbid obesity (HCC)    NSVT (nonsustained ventricular tachycardia) (HCC)    a. Noted on tele during 02/2014 adm for symptomatic anemia.   Stroke [facility])    SVT (supraventricular tachycardia) (HCC)    a. Noted on tele during 02/2014 adm for symptomatic anemia.   Thyroid disease    Vaginal bleeding     Past Surgical History:  Procedure Laterality Date   COLONOSCOPY N/A 09/30/2019   Procedure: COLONOSCOPY;  Surgeon: Malissa Hippo, MD;  Location: [facility];  Service: Endoscopy;  Laterality: N/A;  730  DILITATION & CURRETTAGE/HYSTROSCOPY WITH NOVASURE ABLATION N/A 08/07/2016   Procedure: DILATATION & CURETTAGE/HYSTEROSCOPY WITH ATTPEMPTED NOVASURE ABLATION AND INSERTION OF IUD;  Surgeon: Lazaro Arms, MD;  Location: [facility] ORS;  Service: Gynecology;  Laterality: N/A;    Family History  Problem Relation Age of Onset   Diabetes Mother    Arthritis Mother    Other Mother        WPW   Mental illness Mother        Depression   Asthma Mother    Obesity Mother    Heart disease Father        History of coronary bypass grafting at age 44   Hypertension Father    Mental illness Sister    Bipolar disorder Sister    Heart disease Sister         possibly WPW   Depression Sister    Varicose Veins Sister    Uterine cancer Maternal Grandmother    Mental illness Maternal Grandmother    Arthritis Maternal Grandmother    Hyperlipidemia Maternal Grandmother    Heart disease Maternal Grandfather    CAD Neg Hx        No family history of EARLY CAD    Social History   Socioeconomic History   Marital status: Divorced    Spouse name: Not on file   Number of children: 0   Years of education: Not on file   Highest education level: Associate degree: occupational, Scientist, product/process development, or vocational program  Occupational History   Occupation: Disabled  Tobacco Use   Smoking status: Never   Smokeless tobacco: Never  Vaping Use   Vaping status: Never Used  Substance and Sexual Activity   Alcohol use: No   Drug use: No   Sexual activity: Not Currently    Birth control/protection: I.U.D., Post-menopausal    Comment: IUD in place to control vaginal bleeding  Other Topics Concern   Not on file  Social History Narrative   Divorced for 3 years,married for 5 years.On disability since 2009 secondary to mental illness.   Social Determinants of Health   Financial Resource Strain: Medium Risk (10/13/2023)   Overall Financial Resource Strain (CARDIA)    Difficulty of Paying Living Expenses: Somewhat hard  Food Insecurity: Patient Declined (10/13/2023)   Hunger Vital Sign    Worried About Running Out of Food in the Last Year: Patient declined    Ran Out of Food in the Last Year: Patient declined  Recent Concern: Food Insecurity - Food Insecurity Present (09/27/2023)   Hunger Vital Sign    Worried About Running Out of Food in the Last Year: Sometimes true    Ran Out of Food in the Last Year: Sometimes true  Transportation Needs: No Transportation Needs (10/13/2023)   PRAPARE - Administrator, Civil Service (Medical): No    Lack of Transportation (Non-Medical): No  Physical Activity: Insufficiently Active (10/13/2023)   Exercise Vital  Sign    Days of Exercise per Week: 4 days    Minutes of Exercise per Session: 30 min  Stress: Stress Concern Present (10/13/2023)   [employer] of Occupational Health - Occupational Stress Questionnaire    Feeling of Stress : Rather much  Social Connections: Socially Isolated (10/13/2023)   Social Connection and Isolation Panel [NHANES]    Frequency of Communication with Friends and Family: Once a week    Frequency of Social Gatherings with Friends and Family: Never    Attends Religious Services: Never  Active Member of Clubs or Organizations: No    Attends Banker Meetings: Never    Marital Status: Divorced  Catering manager Violence: Not At Risk (10/01/2023)   Humiliation, Afraid, Rape, and Kick questionnaire    Fear of Current or Ex-Partner: No    Emotionally Abused: No    Physically Abused: No    Sexually Abused: No    Outpatient Medications Prior to Visit  Medication Sig Dispense Refill   Accu-Chek Softclix Lancets lancets USE 1 LANCET TO CHECK GLUCOSE ONCE DAILY 100 each 3   albuterol (PROVENTIL) (5 MG/ML) 0.5% nebulizer solution Take 0.5 mLs (2.5 mg total) by nebulization every 6 (six) hours as needed for wheezing or shortness of breath. 20 mL 0   albuterol (VENTOLIN HFA) 108 (90 Base) MCG/ACT inhaler Inhale 2 puffs into the lungs as needed (wheezing).      atorvastatin (LIPITOR) 80 MG tablet Take 1 tablet (80 mg total) by mouth daily. 90 tablet 3   blood glucose meter kit and supplies KIT Dispense based on patient and insurance preference. Use once a day. Diagnosis code:E11.9 1 each 0   buPROPion (WELLBUTRIN XL) 300 MG 24 hr tablet Take 1 tablet (300 mg total) by mouth daily. 90 tablet 3   Cholecalciferol (VITAMIN D3 PO) Take 10,000 Units by mouth daily.     clindamycin (CLEOCIN T) 1 % lotion      clotrimazole-betamethasone (LOTRISONE) cream APPLY  CREAM TOPICALLY TO AFFECTED AREA TWICE DAILY 30 g 0   docusate sodium (ENEMEEZ) 283 MG enema Place 1 enema  (283 mg total) rectally daily. 30 each 0   empagliflozin (JARDIANCE) 10 MG TABS tablet Take 1 tablet (10 mg total) by mouth daily. 90 tablet 3   estradiol (ESTRACE) 2 MG tablet Take 1 tablet (2 mg total) by mouth at bedtime. 90 tablet 3   glucose blood (ACCU-CHEK GUIDE) test strip USE 1 STRIP TO CHECK GLUCOSE ONCE DAILY 100 each 0   glucose blood (ACCU-CHEK GUIDE) test strip USE 1 STRIP TO CHECK GLUCOSE ONCE DAILY 100 each 0   levocetirizine (XYZAL) 5 MG tablet Take 1 tablet (5 mg total) by mouth every evening. 90 tablet 3   levonorgestrel (MIRENA) 20 MCG/24HR IUD 1 each by Intrauterine route once.     levothyroxine (SYNTHROID) 150 MCG tablet Take 1 tablet (150 mcg total) by mouth daily. 90 tablet 0   lidocaine (XYLOCAINE) 2 % solution Use as directed 15 mLs in the mouth or throat as needed for mouth pain. 100 mL 0   lisinopril (ZESTRIL) 2.5 MG tablet Take 1 tablet (2.5 mg total) by mouth daily. 90 tablet 3   Magnesium 200 MG TABS Take 1 tablet by mouth as needed.     megestrol (MEGACE) 40 MG tablet Take 1 tablet (40 mg total) by mouth daily. 30 tablet 11   metoprolol tartrate (LOPRESSOR) 50 MG tablet Take 1 tablet (50 mg total) by mouth 2 (two) times daily. 180 tablet 1   nystatin (MYCOSTATIN/NYSTOP) powder Apply 1 Application topically 3 (three) times daily. 60 g 0   potassium chloride SA (KLOR-CON M) 20 MEQ tablet Take 1 tablet (20 mEq total) by mouth as needed (with lasix). 30 tablet 3   Rimegepant Sulfate (NURTEC) 75 MG TBDP Take 75 mg by mouth as needed. 8 tablet 6   SYMBICORT 160-4.5 MCG/ACT inhaler Inhale 2 puffs into the lungs as needed. 1 each 2   tirzepatide (MOUNJARO) 10 MG/0.5ML Pen Inject 10 mg into the skin once  a week. 6 mL 1   topiramate (TOPAMAX) 100 MG tablet Take 25 mg (1 pill) at bedtime for one week, then increase to 50 mg (2 pills) at bedtime for one week, then take 75 mg (3 pills) at bedtime for one week, then take 100 mg (4 pills) at bedtime and continue at that dose 90  tablet 3   torsemide (DEMADEX) 20 MG tablet Take 1 tablet (20 mg total) by mouth as needed. 30 tablet 3   Lancet Device MISC Use to check your blood sugar daily 100 each 5   tirzepatide (MOUNJARO) 7.5 MG/0.5ML Pen Inject 7.5 mg into the skin once a week. 2 mL 0   No facility-administered medications prior to visit.    No Known Allergies  ROS Review of Systems  Constitutional:  Negative for chills and fever.  HENT:  Negative for congestion, sinus pressure, sinus pain and sore throat.   Eyes:  Negative for pain and discharge.  Respiratory:  Negative for cough and shortness of breath.   Cardiovascular:  Positive for leg swelling. Negative for chest pain and palpitations.  Gastrointestinal:  Negative for abdominal pain, diarrhea, nausea and vomiting.  Endocrine: Negative for polydipsia and polyuria.  Genitourinary:  Negative for dysuria and hematuria.  Musculoskeletal:  Positive for arthralgias. Negative for neck pain and neck stiffness.  Skin:  Negative for rash.  Neurological:  Negative for dizziness and weakness.  Psychiatric/Behavioral:  Negative for agitation and behavioral problems.       Objective:    Physical Exam Vitals reviewed.  Constitutional:      General: She is not in acute distress.    Appearance: She is obese. She is not diaphoretic.  HENT:     Head: Normocephalic and atraumatic.     Nose: Nose normal. No congestion.     Mouth/Throat:     Mouth: Mucous membranes are moist.     Pharynx: No posterior oropharyngeal erythema.  Eyes:     General: No scleral icterus.    Extraocular Movements: Extraocular movements intact.  Cardiovascular:     Rate and Rhythm: Normal rate and regular rhythm.     Heart sounds: Normal heart sounds. No murmur heard. Pulmonary:     Breath sounds: Normal breath sounds. No wheezing or rales.  Abdominal:     Palpations: Abdomen is soft.     Tenderness: There is no abdominal tenderness.     Hernia: A hernia (Umbilical) is present.      Comments: Abdominal skin sagging - panniculus  Musculoskeletal:     Cervical back: Neck supple. No tenderness.     Right lower leg: No edema.     Left lower leg: No edema.  Skin:    General: Skin is warm.     Findings: No rash.     Comments: Varicose veins on LE bilaterally  Neurological:     General: No focal deficit present.     Mental Status: She is alert and oriented to person, place, and time.  Psychiatric:        Mood and Affect: Mood normal.        Behavior: Behavior normal.     BP 128/82 (BP Location: Right Arm, Patient Position: Sitting, Cuff Size: Normal)   Pulse 79   Ht 5\' 9"  (1.753 m)   Wt 249 lb 9.6 oz (113.2 kg)   SpO2 98%   BMI 36.86 kg/m  Wt Readings from Last 3 Encounters:  10/17/23 249 lb 9.6 oz (113.2 kg)  10/01/23  248 lb (112.5 kg)  06/16/23 256 lb 12.8 oz (116.5 kg)    Lab Results  Component Value Date   TSH 1.970 06/16/2023   Lab Results  Component Value Date   WBC 11.0 (H) 10/16/2022   HGB 17.4 (H) 10/16/2022   HCT 51.2 (H) 10/16/2022   MCV 93 10/16/2022   PLT 250 10/16/2022   Lab Results  Component Value Date   NA 144 06/16/2023   K 4.1 06/16/2023   CO2 18 (L) 06/16/2023   GLUCOSE 88 06/16/2023   BUN 16 06/16/2023   CREATININE 1.29 (H) 06/16/2023   BILITOT 0.6 06/16/2023   ALKPHOS 126 (H) 06/16/2023   AST 36 06/16/2023   ALT 66 (H) 06/16/2023   PROT 6.5 06/16/2023   ALBUMIN 4.1 06/16/2023   CALCIUM 9.2 06/16/2023   ANIONGAP 7 07/21/2022   EGFR 49 (L) 06/16/2023   Lab Results  Component Value Date   CHOL 95 (L) 06/16/2023   Lab Results  Component Value Date   HDL 34 (L) 06/16/2023   Lab Results  Component Value Date   LDLCALC 45 06/16/2023   Lab Results  Component Value Date   TRIG 78 06/16/2023   Lab Results  Component Value Date   CHOLHDL 2.8 06/16/2023   Lab Results  Component Value Date   HGBA1C 5.4 06/16/2023      Assessment & Plan:   Problem List Items Addressed This Visit       Cardiovascular  and Mediastinum   Essential hypertension, benign    BP Readings from Last 1 Encounters:  10/17/23 128/82   Well-controlled with lisinopril and Metoprolol Counseled for compliance with the medications Advised DASH diet and moderate exercise/walking, at least 150 mins/week      Varicose veins of both legs with edema    Has had intermittent leg swelling Perform leg elevation as tolerated Has not required torsemide recently        Endocrine   Type 2 diabetes mellitus with other specified complication (HCC) - Primary    Lab Results  Component Value Date   HGBA1C 5.4 06/16/2023   Well controlled Associated with HTN, CHF and HLD On Mounjaro 10 mg qw and Jardiance 10 mg QD  Home blood glucose readings reviewed -mostly between 90-120 Advised to follow diabetic diet On statin and ACEi F/u CMP and lipid panel Diabetic eye exam: Advised to follow up with Ophthalmology for diabetic eye exam      Relevant Medications   Lancet Device MISC   Hypothyroidism    Lab Results  Component Value Date   TSH 1.970 06/16/2023   On levothyroxine 150 mcg daily Borderline elevated free T4 in the last visit, recheck TSH and free T4        Genitourinary   Stage 3a chronic kidney disease (HCC)    Last CMP reviewed, GFR stable around 50-55 Avoid nephrotoxic agents On lisinopril and Jardiance Recheck CMP         Other   Mixed hyperlipidemia    On statin Reviewed lipid profile      Abdominal panniculus, symptomatic    Has 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    Anabel Halon, MD

## 2023-10-17 NOTE — Patient Instructions (Signed)
Please continue to take medications as prescribed. ? ?Please continue to follow low carb diet and perform moderate exercise/walking at least 150 mins/week. ?

## 2023-10-17 NOTE — Assessment & Plan Note (Signed)
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

## 2023-10-17 NOTE — Assessment & Plan Note (Signed)
BP Readings from Last 1 Encounters:  10/17/23 128/82   Well-controlled with lisinopril and Metoprolol Counseled for compliance with the medications Advised DASH diet and moderate exercise/walking, at least 150 mins/week

## 2023-10-17 NOTE — Assessment & Plan Note (Signed)
Last CMP reviewed, GFR stable around 50-55 Avoid nephrotoxic agents On lisinopril and Jardiance Recheck CMP

## 2023-10-19 LAB — CMP14+EGFR
ALT: 28 [IU]/L (ref 0–32)
AST: 24 [IU]/L (ref 0–40)
Albumin: 4 g/dL (ref 3.8–4.9)
Alkaline Phosphatase: 110 IU/L (ref 44–121)
BUN/Creatinine Ratio: 16 (ref 9–23)
BUN: 21 mg/dL (ref 6–24)
Bilirubin Total: 0.7 mg/dL (ref 0.0–1.2)
CO2: 17 mmol/L — ABNORMAL LOW (ref 20–29)
Calcium: 9.1 mg/dL (ref 8.7–10.2)
Chloride: 110 mmol/L — ABNORMAL HIGH (ref 96–106)
Creatinine, Ser: 1.3 mg/dL — ABNORMAL HIGH (ref 0.57–1.00)
Globulin, Total: 2.4 g/dL (ref 1.5–4.5)
Glucose: 76 mg/dL (ref 70–99)
Potassium: 4 mmol/L (ref 3.5–5.2)
Sodium: 144 mmol/L (ref 134–144)
Total Protein: 6.4 g/dL (ref 6.0–8.5)
eGFR: 49 mL/min/{1.73_m2} — ABNORMAL LOW (ref >59)

## 2023-10-19 LAB — CBC WITH DIFFERENTIAL/PLATELET
Basophils Absolute: 0.1 10*3/uL (ref 0.0–0.2)
Basos: 1 %
EOS (ABSOLUTE): 0.4 10*3/uL (ref 0.0–0.4)
Eos: 4 %
Hematocrit: 49.7 % — ABNORMAL HIGH (ref 34.0–46.6)
Hemoglobin: 17 g/dL — ABNORMAL HIGH (ref 11.1–15.9)
Immature Grans (Abs): 0 10*3/uL (ref 0.0–0.1)
Immature Granulocytes: 0 %
Lymphocytes Absolute: 2.3 10*3/uL (ref 0.7–3.1)
Lymphs: 26 %
MCH: 33.1 pg — ABNORMAL HIGH (ref 26.6–33.0)
MCHC: 34.2 g/dL (ref 31.5–35.7)
MCV: 97 fL (ref 79–97)
Monocytes Absolute: 0.4 10*3/uL (ref 0.1–0.9)
Monocytes: 4 %
Neutrophils Absolute: 5.8 10*3/uL (ref 1.4–7.0)
Neutrophils: 65 %
Platelets: 212 10*3/uL (ref 150–450)
RBC: 5.13 x10E6/uL (ref 3.77–5.28)
RDW: 11.6 % — ABNORMAL LOW (ref 11.7–15.4)
WBC: 9 10*3/uL (ref 3.4–10.8)

## 2023-10-19 LAB — HEMOGLOBIN A1C
Est. average glucose Bld gHb Est-mCnc: 103 mg/dL
Hgb A1c MFr Bld: 5.2 % (ref 4.8–5.6)

## 2023-10-19 LAB — MICROALBUMIN / CREATININE URINE RATIO
Creatinine, Urine: 217.5 mg/dL
Microalb/Creat Ratio: 11 mg/g{creat} (ref 0–29)
Microalbumin, Urine: 24.4 ug/mL

## 2023-10-19 LAB — TSH+FREE T4
Free T4: 2.17 ng/dL — ABNORMAL HIGH (ref 0.82–1.77)
TSH: 1.84 u[IU]/mL (ref 0.450–4.500)

## 2023-10-19 LAB — HEPATITIS C ANTIBODY: Hep C Virus Ab: NONREACTIVE

## 2023-10-20 ENCOUNTER — Other Ambulatory Visit: Payer: Self-pay | Admitting: Internal Medicine

## 2023-10-20 DIAGNOSIS — E039 Hypothyroidism, unspecified: Secondary | ICD-10-CM

## 2023-10-20 MED ORDER — LEVOTHYROXINE SODIUM 125 MCG PO TABS: 125.0000 ug | ORAL_TABLET | Freq: Every day | ORAL | 1 refills | Status: DC

## 2023-10-20 NOTE — Telephone Encounter (Signed)
Rescheduled MyChart Video Visit on 01/06/24 at 8:45 am

## 2023-10-22 ENCOUNTER — Other Ambulatory Visit: Payer: Self-pay | Admitting: Internal Medicine

## 2023-10-22 DIAGNOSIS — L304 Erythema intertrigo: Secondary | ICD-10-CM

## 2023-11-03 ENCOUNTER — Ambulatory Visit: Payer: Medicare Other | Admitting: Nurse Practitioner

## 2023-11-07 ENCOUNTER — Encounter: Payer: Self-pay | Admitting: Obstetrics & Gynecology

## 2023-11-11 MED ORDER — MEGESTROL ACETATE 40 MG PO TABS: 40.0000 mg | ORAL_TABLET | Freq: Every day | ORAL | 11 refills | Status: AC

## 2023-11-12 ENCOUNTER — Ambulatory Visit: Payer: Medicare Other | Attending: Nurse Practitioner | Admitting: Nurse Practitioner

## 2023-11-12 ENCOUNTER — Encounter: Payer: Self-pay | Admitting: Nurse Practitioner

## 2023-11-12 VITALS — BP 116/70 | HR 74 | Ht 69.0 in | Wt 247.0 lb

## 2023-11-12 DIAGNOSIS — Z8673 Personal history of transient ischemic attack (TIA), and cerebral infarction without residual deficits: Secondary | ICD-10-CM | POA: Diagnosis not present

## 2023-11-12 DIAGNOSIS — I421 Obstructive hypertrophic cardiomyopathy: Secondary | ICD-10-CM | POA: Insufficient documentation

## 2023-11-12 DIAGNOSIS — G459 Transient cerebral ischemic attack, unspecified: Secondary | ICD-10-CM | POA: Diagnosis not present

## 2023-11-12 DIAGNOSIS — I10 Essential (primary) hypertension: Secondary | ICD-10-CM | POA: Diagnosis not present

## 2023-11-12 DIAGNOSIS — E669 Obesity, unspecified: Secondary | ICD-10-CM | POA: Insufficient documentation

## 2023-11-12 DIAGNOSIS — I6523 Occlusion and stenosis of bilateral carotid arteries: Secondary | ICD-10-CM | POA: Diagnosis not present

## 2023-11-12 LAB — HEMOGLOBIN: Hemoglobin: 17.4 g/dL — ABNORMAL HIGH (ref 11.1–15.9)

## 2023-11-12 NOTE — Patient Instructions (Addendum)
Medication Instructions:  Your physician recommends that you continue on your current medications as directed. Please refer to the Current Medication list given to you today.  Labwork: Today  Testing/Procedures: Your physician has requested that you have a cardiac MRI. Cardiac MRI uses a computer to create images of your heart as its beating, producing both still and moving pictures of your heart and major blood vessels. For further information please visit [email or web address]. Please follow the instruction sheet given to you today for more information. Your physician has requested that you have a carotid duplex. This test is an ultrasound of the carotid arteries in your neck. It looks at blood flow through these arteries that supply the brain with blood. Allow one hour for this exam. There are no restrictions or special instructions. Your physician has recommended that you wear a holter monitor. Holter monitors are medical devices that record the heart's electrical activity. Doctors most often use these monitors to diagnose arrhythmias. Arrhythmias are problems with the speed or rhythm of the heartbeat. The monitor is a small, portable device. You can wear one while you do your normal daily activities. This is usually used to diagnose what is causing palpitations/syncope (passing out).  Follow-Up: Your physician recommends that you schedule a follow-up appointment in: 6-8 weeks   Any Other Special Instructions Will Be Listed Below (If Applicable).  If you need a refill on your cardiac medications before your next appointment, please call your pharmacy.

## 2023-11-12 NOTE — Progress Notes (Addendum)
Cardiology Office Note:  .   Date:  11/12/2023 ID:  Alda Berthold, DOB 27-Aug-1971, MRN [id number] PCP: Anabel Halon, MD  [facility] Providers Cardiologist:  Little Ishikawa, MD    History of Present Illness: ALITHA WELLIVER is a 52 y.o. 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   Last seen by Dr. Bjorn Pippin on October 26, 2021.  Reported chronic shortness of breath that she attributed to asthma as well as occasional lightheadedness when standing, denied any syncope.  Reported rare palpitations, lasting very briefly and only a few seconds.  Was walking every day at that time.  Echocardiogram was updated and revealed findings consistent with hokum, SAM with Valsalva peak and LVOT that appeared to be around 3 m/sec but images were difficult due to body habitus, EF 60 to 65% -see full report below.  Dr. Gaynelle Arabian recommended cardiac MRI.  Appears that this has not been obtained.   Today she presents for follow-up. She has lost 50 lbs since last office visit. Says she is doing very well and feels the best she has after her significant weight loss. Denies any chest pain, but admits to chest tightness at times, says it is hard to breathe during these episodes, denies any triggers. Alleviating factors include taking deep breaths and says taking metoprolol helps, says she can't remember the last time she had an episode, rare and has improved with her weight loss. Denies any palpitations, syncope, presyncope, dizziness, orthopnea, PND, swelling or significant weight changes, acute bleeding, or claudication. Says she had a TIA many years ago and wants to know if she needs another carotid duplex   ROS: Negative. See HPI.  FH: Father - CABG x 4, Mother - WPW.   Studies Reviewed: [name]   EKG Interpretation Date/Time:  Wednesday November 12 2023 09:32:30 EST Ventricular Rate:  79 PR Interval:  170 QRS Duration:  96 QT Interval:  370 QTC Calculation: 424 R Axis:   25  Text Interpretation: Normal sinus rhythm Normal ECG When compared with ECG of 31-Jan-2019 12:10, No significant change was found Confirmed by Sharlene Dory [phone]) on 11/12/2023 9:53:13 AM    Echo 11/2021: 1. Findings consistent with HOCM. SAM. with valsalva peak velocity in  LVOT appears to be around 46m/sec but images difficult due to body habitus.   2. Left ventricular ejection fraction, by estimation, is 60 to 65%. The  left ventricle has normal function. The left ventricle has no regional  wall motion abnormalities. There is severe asymmetric left ventricular  hypertrophy of the basal and septal  segments. Left ventricular diastolic parameters are consistent with Grade  I diastolic dysfunction (impaired relaxation).   3. Right ventricular systolic function is normal. The right ventricular  size is normal. There is normal pulmonary artery systolic pressure.   4. The mitral valve is abnormal. Mild mitral valve regurgitation. No  evidence of mitral stenosis. Moderate mitral annular calcification.   5. The aortic valve is tricuspid. There is mild calcification of the  aortic valve. Aortic valve regurgitation is not visualized. Aortic valve  sclerosis is present, with no evidence of aortic valve stenosis.   6. The inferior vena cava is normal in size with greater than 50%  respiratory variability, suggesting right atrial pressure of 3 mmHg.  Carotid duplex 06/2016: IMPRESSION: Less than 50% stenosis in the  right and left internal carotid arteries.   Physical Exam:   VS:  BP 116/70   Pulse 74   Ht 5\' 9"  (1.753 m)   Wt 247 lb (112 kg)   SpO2 99%   BMI 36.48 kg/m    Wt Readings from Last 3 Encounters:  11/12/23 247 lb (112 kg)  10/17/23 249 lb 9.6 oz (113.2 kg)  10/01/23 248 lb (112.5 kg)    GEN: Obese, 52 y.o. female in no acute distress NECK:  No JVD; No carotid bruits CARDIAC: S1/S2, RRR, no murmurs, rubs, gallops RESPIRATORY:  Clear to auscultation without rales, wheezing or rhonchi  ABDOMEN: Soft, non-tender, non-distended EXTREMITIES:  No edema; No deformity   ASSESSMENT AND PLAN: .    HOCM TTE in 2022 revealed findings consistent with HOCM, Dr. Bjorn Pippin recommended proceeding with cMRI for further evaluation. Has not had this completed and will arrange cMRI for further evaluation. EF 60-65%, denies any red flag signs/symptoms. Will also arrange a Holter Monitor to evaluate for significant arrhthymias. Discussed genetic testing for relatives, she verbalized understanding. No medication changes at this time. Care and ED precautions discussed.  HTN BP stable. Discussed to monitor BP at home at least 2 hours after medications and sitting for 5-10 minutes. No medication changes at this time. Heart healthy diet and regular cardiovascular exercise encouraged.   Hx of TIA, carotid artery stenosis Remote history many years ago. Last carotid duplex in 2017 revealed < 50% bilateral carotid artery stenosis. Will update carotid duplex at this time.   Obesity Weight loss via diet and exercise encouraged. Discussed the impact being overweight would have on cardiovascular risk. Congratulated her on her significant weight loss.    Dispo: Follow-up with MD/[facility] in 6-8 weeks or sooner if anything changes.   Signed, Sharlene Dory, NP

## 2023-11-19 ENCOUNTER — Institutional Professional Consult (permissible substitution): Payer: Medicare Other | Admitting: Plastic Surgery

## 2023-11-19 ENCOUNTER — Ambulatory Visit: Payer: Medicare Other | Attending: Nurse Practitioner

## 2023-11-19 DIAGNOSIS — I10 Essential (primary) hypertension: Secondary | ICD-10-CM

## 2023-11-19 DIAGNOSIS — I6523 Occlusion and stenosis of bilateral carotid arteries: Secondary | ICD-10-CM

## 2023-11-19 DIAGNOSIS — Z8673 Personal history of transient ischemic attack (TIA), and cerebral infarction without residual deficits: Secondary | ICD-10-CM | POA: Diagnosis not present

## 2023-11-19 DIAGNOSIS — I421 Obstructive hypertrophic cardiomyopathy: Secondary | ICD-10-CM | POA: Diagnosis not present

## 2023-11-26 ENCOUNTER — Ambulatory Visit: Payer: Medicare Other | Attending: Nurse Practitioner

## 2023-11-26 DIAGNOSIS — Z8673 Personal history of transient ischemic attack (TIA), and cerebral infarction without residual deficits: Secondary | ICD-10-CM | POA: Diagnosis not present

## 2023-11-26 DIAGNOSIS — I421 Obstructive hypertrophic cardiomyopathy: Secondary | ICD-10-CM | POA: Insufficient documentation

## 2023-11-26 DIAGNOSIS — I6523 Occlusion and stenosis of bilateral carotid arteries: Secondary | ICD-10-CM

## 2023-11-27 NOTE — Addendum Note (Signed)
Addended by: Sharen Hones on: 11/27/2023 02:25 PM   Modules accepted: Orders

## 2023-12-01 ENCOUNTER — Encounter: Payer: Self-pay | Admitting: Internal Medicine

## 2023-12-03 ENCOUNTER — Other Ambulatory Visit: Payer: Self-pay | Admitting: Internal Medicine

## 2023-12-03 ENCOUNTER — Encounter: Payer: Self-pay | Admitting: Internal Medicine

## 2023-12-03 DIAGNOSIS — E1169 Type 2 diabetes mellitus with other specified complication: Secondary | ICD-10-CM

## 2023-12-04 ENCOUNTER — Other Ambulatory Visit: Payer: Self-pay | Admitting: Internal Medicine

## 2023-12-04 DIAGNOSIS — E1169 Type 2 diabetes mellitus with other specified complication: Secondary | ICD-10-CM

## 2023-12-04 MED ORDER — TIRZEPATIDE 12.5 MG/0.5ML ~~LOC~~ SOAJ: 12.5000 mg | SUBCUTANEOUS | 0 refills | Status: DC

## 2023-12-09 DIAGNOSIS — E119 Type 2 diabetes mellitus without complications: Secondary | ICD-10-CM | POA: Diagnosis not present

## 2023-12-09 LAB — HM DIABETES EYE EXAM

## 2023-12-11 ENCOUNTER — Telehealth: Payer: Medicare Other | Admitting: Family Medicine

## 2023-12-15 ENCOUNTER — Other Ambulatory Visit: Payer: Self-pay | Admitting: Obstetrics & Gynecology

## 2023-12-16 ENCOUNTER — Telehealth: Payer: Medicare Other | Admitting: Family Medicine

## 2023-12-22 ENCOUNTER — Other Ambulatory Visit: Payer: Self-pay | Admitting: Internal Medicine

## 2023-12-22 ENCOUNTER — Other Ambulatory Visit: Payer: Self-pay | Admitting: *Deleted

## 2023-12-22 DIAGNOSIS — I10 Essential (primary) hypertension: Secondary | ICD-10-CM

## 2023-12-23 ENCOUNTER — Encounter: Payer: Self-pay | Admitting: Internal Medicine

## 2023-12-24 ENCOUNTER — Ambulatory Visit: Payer: Medicare Other | Attending: Internal Medicine | Admitting: Internal Medicine

## 2023-12-24 ENCOUNTER — Encounter: Payer: Self-pay | Admitting: Internal Medicine

## 2023-12-24 VITALS — BP 112/80 | HR 77 | Ht 69.0 in | Wt 248.0 lb

## 2023-12-24 DIAGNOSIS — I10 Essential (primary) hypertension: Secondary | ICD-10-CM | POA: Diagnosis not present

## 2023-12-24 DIAGNOSIS — I421 Obstructive hypertrophic cardiomyopathy: Secondary | ICD-10-CM | POA: Diagnosis not present

## 2023-12-24 DIAGNOSIS — E782 Mixed hyperlipidemia: Secondary | ICD-10-CM | POA: Diagnosis not present

## 2023-12-24 DIAGNOSIS — I5032 Chronic diastolic (congestive) heart failure: Secondary | ICD-10-CM | POA: Insufficient documentation

## 2023-12-24 NOTE — Progress Notes (Signed)
 Cardiology Office Note  Date: 12/24/2023   ID: Erin Sexton, DOB May 10, 1971, MRN [id number]  PCP:  Meldon Sport, MD  Cardiologist:  Lasalle Pointer, MD Electrophysiologist:  None   History of Present Illness: Erin Sexton is a 53 y.o. female known to have hypertrophic obstructive cardiomyopathy (per echo from 2017 and 2022), HTN, DM 2, HLD, morbid obesity, chronic diastolic heart failure, OSA is here for follow-up visit.  Compensated, no symptoms.  No DOE, orthopnea, PND or leg swelling.  No chest pain.  She does complain of dizziness only with ocular migraines but does not have any dizziness outside of this window.  No syncope.  No leg swelling.  She lost around 150 pounds of weight in the last 3 years after starting Mounjaro .  Past Medical History:  Diagnosis Date   Abnormal CT scan    a. 02/2014: evaluated for dyspnea and initially placed on Xarelto  for PE because radiologist could not exclude small peripheral pulmonary emboli because of limited contrast. However, after admission for ++vaginal bleeding several days later, it was felt she did NOT have PE - underwent further testing with neg VQ 03/06/14, neg LE duplex.   Anemia    Anxiety    Arthritis    Asthma    Blood transfusion without reported diagnosis    Bone spur of right foot    CHF (congestive heart failure) (HCC)    COPD (chronic obstructive pulmonary disease) (HCC)    Depression    Diabetes mellitus without complication (HCC)    a. Dx 02/2014.   Heart murmur    History of echocardiogram 2015   normal EF, Grade II diastolic dysfunction   HOCM (hypertrophic obstructive cardiomyopathy) (HCC)    a. Dx 40981.   Hyperlipidemia    Hypertension    Morbid obesity (HCC)    NSVT (nonsustained ventricular tachycardia) (HCC)    a. Noted on tele during 02/2014 adm for symptomatic anemia.   Stroke [facility])    SVT (supraventricular tachycardia) (HCC)    a. Noted on tele during 02/2014 adm for symptomatic anemia.    Thyroid  disease    Vaginal bleeding     Past Surgical History:  Procedure Laterality Date   COLONOSCOPY N/A 09/30/2019   Procedure: COLONOSCOPY;  Surgeon: Ruby Corporal, MD;  Location: [facility];  Service: Endoscopy;  Laterality: N/A;  730   DILITATION & CURRETTAGE/HYSTROSCOPY WITH NOVASURE ABLATION N/A 08/07/2016   Procedure: DILATATION & CURETTAGE/HYSTEROSCOPY WITH ATTPEMPTED NOVASURE ABLATION AND INSERTION OF IUD;  Surgeon: Wendelyn Halter, MD;  Location: [facility] ORS;  Service: Gynecology;  Laterality: N/A;    Current Outpatient Medications  Medication Sig Dispense Refill   albuterol  (PROVENTIL ) (5 MG/ML) 0.5% nebulizer solution Take 0.5 mLs (2.5 mg total) by nebulization every 6 (six) hours as needed for wheezing or shortness of breath. 20 mL 0   albuterol  (VENTOLIN  HFA) 108 (90 Base) MCG/ACT inhaler Inhale 2 puffs into the lungs as needed (wheezing).      atorvastatin  (LIPITOR) 80 MG tablet Take 1 tablet (80 mg total) by mouth daily. 90 tablet 3   buPROPion  (WELLBUTRIN  XL) 300 MG 24 hr tablet Take 1 tablet (300 mg total) by mouth daily. 90 tablet 3   Cholecalciferol (VITAMIN D3 PO) Take 10,000 Units by mouth daily.     clindamycin (CLEOCIN T) 1 % lotion      clotrimazole -betamethasone  (LOTRISONE ) cream APPLY  CREAM TOPICALLY TO AFFECTED AREA TWICE DAILY 30 g 2   empagliflozin  (  JARDIANCE ) 10 MG TABS tablet Take 1 tablet (10 mg total) by mouth daily. 90 tablet 3   estradiol  (ESTRACE ) 2 MG tablet TAKE 1 TABLET BY MOUTH AT BEDTIME 90 tablet 3   levocetirizine (XYZAL ) 5 MG tablet Take 1 tablet (5 mg total) by mouth every evening. 90 tablet 3   levonorgestrel  (MIRENA ) 20 MCG/24HR IUD 1 each by Intrauterine route once.     levothyroxine  (SYNTHROID ) 125 MCG tablet Take 1 tablet (125 mcg total) by mouth daily. 90 tablet 1   lisinopril  (ZESTRIL ) 2.5 MG tablet Take 1 tablet (2.5 mg total) by mouth daily. 90 tablet 3   megestrol  (MEGACE ) 40 MG tablet Take 1 tablet (40 mg total) by mouth daily. 30  tablet 11   metoprolol  tartrate (LOPRESSOR ) 50 MG tablet Take 1 tablet by mouth twice daily 180 tablet 0   nystatin  (MYCOSTATIN /NYSTOP ) powder Apply 1 Application topically 3 (three) times daily. 60 g 0   Rimegepant Sulfate (NURTEC) 75 MG TBDP Take 75 mg by mouth as needed. 8 tablet 6   SYMBICORT  160-4.5 MCG/ACT inhaler Inhale 2 puffs into the lungs as needed. 1 each 2   tirzepatide  (MOUNJARO ) 12.5 MG/0.5ML Pen Inject 12.5 mg into the skin once a week. 6 mL 0   topiramate  (TOPAMAX ) 100 MG tablet Take 25 mg (1 pill) at bedtime for one week, then increase to 50 mg (2 pills) at bedtime for one week, then take 75 mg (3 pills) at bedtime for one week, then take 100 mg (4 pills) at bedtime and continue at that dose 90 tablet 3   Accu-Chek Softclix Lancets lancets USE 1 LANCET TO CHECK GLUCOSE ONCE DAILY 100 each 3   blood glucose meter kit and supplies KIT Dispense based on patient and insurance preference. Use once a day. Diagnosis code:E11.9 1 each 0   glucose blood (ACCU-CHEK GUIDE) test strip USE 1 STRIP TO CHECK GLUCOSE ONCE DAILY 100 each 0   No current facility-administered medications for this visit.   Allergies:  Patient has no known allergies.   Social History: The patient  reports that she has never smoked. She has never used smokeless tobacco. She reports that she does not drink alcohol and does not use drugs.   Family History: The patient's family history includes Arthritis in her maternal grandmother and mother; Asthma in her mother; Bipolar disorder in her sister; Depression in her sister; Diabetes in her mother; Heart disease in her father, maternal grandfather, and sister; Hyperlipidemia in her maternal grandmother; Hypertension in her father; Mental illness in her maternal grandmother, mother, and sister; Obesity in her mother; Other in her mother; Uterine cancer in her maternal grandmother; Varicose Veins in her sister.   ROS:  Please see the history of present illness. Otherwise,  complete review of systems is positive for none  All other systems are reviewed and negative.   Physical Exam: VS:  BP 112/80   Pulse 77   Ht 5\' 9"  (1.753 m)   Wt 248 lb (112.5 kg)   SpO2 99%   BMI 36.62 kg/m , BMI Body mass index is 36.62 kg/m.  Wt Readings from Last 3 Encounters:  12/24/23 248 lb (112.5 kg)  11/12/23 247 lb (112 kg)  10/17/23 249 lb 9.6 oz (113.2 kg)    General: Patient appears comfortable at rest. HEENT: Conjunctiva and lids normal, oropharynx clear with moist mucosa. Neck: Supple, no elevated JVP or carotid bruits, no thyromegaly. Lungs: Clear to auscultation, nonlabored breathing at rest. Cardiac: Regular rate  and rhythm, no S3 or significant systolic murmur, no pericardial rub. Abdomen: Soft, nontender, no hepatomegaly, bowel sounds present, no guarding or rebound. Extremities: No pitting edema, distal pulses 2+. Skin: Warm and dry. Musculoskeletal: No kyphosis. Neuropsychiatric: Alert and oriented x3, affect grossly appropriate.  Recent Labwork: 10/17/2023: ALT 28; AST 24; BUN 21; Creatinine, Ser 1.30; Platelets 212; Potassium 4.0; Sodium 144; TSH 1.840 11/12/2023: Hemoglobin 17.4     Component Value Date/Time   CHOL 95 (L) 06/16/2023 1121   TRIG 78 06/16/2023 1121   HDL 34 (L) 06/16/2023 1121   CHOLHDL 2.8 06/16/2023 1121   CHOLHDL 2.7 06/27/2020 1355   VLDL 18 06/21/2016 0510   LDLCALC 45 06/16/2023 1121   LDLCALC 21 06/27/2020 1355     Assessment and Plan:  HOCM Chronic diastolic heart failure HTN, controlled DM 2, HLD, morbid obesity   -Echocardiogram from 2017 and 2022 consistent with hypertrophic obstructive cardiomyopathy.  SAM, LVOT velocity appears to be around 3 m/s but suboptimal images.  Cardiac MRI is pending.  She appears to be compensated and has no symptoms of DOE, orthopnea, PND or dizziness.  She does have dizziness only with ocular migraines.  She underwent event monitor recently that showed no evidence of ventricular  arrhythmias.  Needs to undergo genetic testing once cardiac MRI is resulted.  No family history of CHF.  Continue current medications, Jardiance  10 mg once daily, metoprolol  titrate 50 mg twice daily and lisinopril  2.5 mg once daily.  Continue atorvastatin  80 mg nightly, goal LDL less than 100.       Medication Adjustments/Labs and Tests Ordered: Current medicines are reviewed at length with the patient today.  Concerns regarding medicines are outlined above.    Disposition:  Follow up  6 months  Signed Jaonna Word Priya Allianna Beaubien, MD, 12/24/2023 11:36 AM    [facility] at [facility] [address] [place], [place], [place] [address]

## 2023-12-24 NOTE — Patient Instructions (Signed)

## 2023-12-25 ENCOUNTER — Ambulatory Visit (HOSPITAL_COMMUNITY): Admission: RE | Admit: 2023-12-25 | Payer: Medicare Other | Source: Ambulatory Visit

## 2023-12-29 ENCOUNTER — Other Ambulatory Visit: Payer: Self-pay | Admitting: Obstetrics & Gynecology

## 2023-12-31 ENCOUNTER — Other Ambulatory Visit: Payer: Self-pay

## 2023-12-31 ENCOUNTER — Encounter: Payer: Self-pay | Admitting: Family Medicine

## 2023-12-31 MED ORDER — TOPIRAMATE 100 MG PO TABS: ORAL_TABLET | ORAL | 0 refills | Status: DC

## 2024-01-01 NOTE — Patient Instructions (Incomplete)

## 2024-01-01 NOTE — Progress Notes (Deleted)
 PATIENT: Erin Sexton DOB: 10-10-1971  REASON FOR VISIT: follow up HISTORY FROM: patient  Virtual Visit via Telephone Note  I connected with Alda Berthold on 01/01/24 at  8:45 AM EST by telephone and verified that I am speaking with the correct person using two identifiers.   I discussed the limitations, risks, security and privacy concerns of performing an evaluation and management service by telephone and the availability of in person appointments. I also discussed with the patient that there may be a patient responsible charge related to this service. The patient expressed understanding and agreed to proceed.   History of Present Illness:  01/01/24 ALL (Mychart): Choua returns for follow up for migraines. She continues topiramate and Nurtec.   12/10/2022 ALL (Mychart): [name] is a 53 y.o. female here today for follow up for migraines. She was seen in consult with Dr Delena Bali 08/2022 and started on topiramate and Nurtec. MRI was unremarkable. Since, she reports headaches have decreased in intensity and frequency. She may have 2-3 headaches a month. She is not using Nurtec regularly but reports it does work well when needed. Her A1C is now 6.1.   History (copied from Dr Quentin Mulling previous note)  HPI:  Medical co-morbidities: HTN, HOCM, TIA, asthma, DM2, hypothyroidism, OSA, HLD   The patient presents for evaluation of headaches which began in July 2023. She used to have migraines in her 42s, but they had improved for several years until recently. Headaches are associated with nausea and vomiting. She will have a visual aura prior to her migraines which will last for 10 minutes at a time. She is currently averaging one migraine every 3 weeks. Headaches can last 4 days to 1 week at a time.   She has tried Imitrex injections which have been ineffective. Excedrin will help take the edge off if she takes it early enough.   Has a history of TIA with left facial droop ~3 years  ago.   Headache History: Onset: July 2023 Triggers: Aura: zigzags in vision, visual distortion (looking through a fishbowl) Location: left temple Associated Symptoms:             Photophobia: no             Phonophobia: no             Nausea: yes Vomiting: yes Worse with activity?: yes Duration of headaches: 4 days   Headache days per month: 7 Headache free days per month: 23   Current Treatment: Abortive Imitrex Excedrin   Preventative none   Prior Therapies                                 Fioricet Imitrex Triptans contraindicated due to TIA magnesium Lisinopril 2.5 mg daily Metoprolol 50 mg BID Cymbalta 60 mg daily Wellbutrin   Observations/Objective:  Generalized: Well developed, in no acute distress  Mentation: Alert oriented to time, place, history taking. Follows all commands speech and language fluent   Assessment and Plan:  53 y.o. year old female  has a past medical history of Abnormal CT scan, Anemia, Anxiety, Arthritis, Asthma, Blood transfusion without reported diagnosis, Bone spur of right foot, CHF (congestive heart failure) (HCC), COPD (chronic obstructive pulmonary disease) (HCC), Depression, Diabetes mellitus without complication (HCC), Heart murmur, History of echocardiogram (2015), HOCM (hypertrophic obstructive cardiomyopathy) (HCC), Hyperlipidemia, Hypertension, Morbid obesity (HCC), NSVT (nonsustained ventricular tachycardia) (HCC), Stroke (HCC), SVT (supraventricular tachycardia) (  HCC), Thyroid disease, and Vaginal bleeding. here with  No diagnosis found.  [name] is doing well on topiramate. Headaches are significantly improved. She will continue topiramate 100mg  daily and Nurtec daily as needed. Healthy lifestyle habits encouraged. She will follow up with me in 1 year, sooner if needed.   No orders of the defined types were placed in this encounter.   No orders of the defined types were placed in this encounter.    Follow Up  Instructions:  I discussed the assessment and treatment plan with the patient. The patient was provided an opportunity to ask questions and all were answered. The patient agreed with the plan and demonstrated an understanding of the instructions.   The patient was advised to call back or seek an in-person evaluation if the symptoms worsen or if the condition fails to improve as anticipated.  I provided 15 minutes of non-face-to-face time during this encounter. Patient located at their place of residence during Mychart visit. Provider is in the office.    Shawnie Dapper, NP

## 2024-01-05 ENCOUNTER — Telehealth: Payer: Self-pay | Admitting: Family Medicine

## 2024-01-05 NOTE — Telephone Encounter (Signed)
LVM and sent mychart msg informing pt of need to reschedule 01/06/24 appt - NP out

## 2024-01-06 ENCOUNTER — Telehealth: Payer: Medicare Other | Admitting: Family Medicine

## 2024-01-07 ENCOUNTER — Telehealth: Payer: Self-pay | Admitting: Family Medicine

## 2024-01-07 NOTE — Telephone Encounter (Signed)
Mychart message

## 2024-01-08 NOTE — Patient Instructions (Addendum)
Below is our plan:  We will continue topiramate and Nurtec as prescribed. You can add Tylenol and or ibuprofen as well as Benadryl to Nurtec for intractable headaches.   Please make sure you are staying well hydrated. I recommend 50-60 ounces daily. Well balanced diet and regular exercise encouraged. Consistent sleep schedule with 6-8 hours recommended.   Please continue follow up with care team as directed.   Follow up with me in 1 year   You may receive a survey regarding today's visit. I encourage you to leave honest feed back as I do use this information to improve patient care. Thank you for seeing me today!   GENERAL HEADACHE INFORMATION:   Natural supplements: Magnesium Oxide or Magnesium Glycinate 500 mg at bed (up to 800 mg daily) Coenzyme Q10 300 mg in AM Vitamin B2- 200 mg twice a day   Add 1 supplement at a time since even natural supplements can have undesirable side effects. You can sometimes buy supplements cheaper (especially Coenzyme Q10) at www.WebmailGuide.co.za or at [facility].  Migraine with aura: There is increased risk for stroke in women with migraine with aura and a contraindication for the combined contraceptive pill for use by women who have migraine with aura. The risk for women with migraine without aura is lower. However other risk factors like smoking are far more likely to increase stroke risk than migraine. There is a recommendation for no smoking and for the use of OCPs without estrogen such as progestogen only pills particularly for women with migraine with aura.[name] People who have migraine headaches with auras may be 3 times more likely to have a stroke caused by a blood clot, compared to migraine patients who don't see auras. Women who take hormone-replacement therapy may be 30 percent more likely to suffer a clot-based stroke than women not taking medication containing estrogen. Other risk factors like smoking and high blood pressure may be  much more important.     Vitamins and herbs that show potential:   Magnesium: Magnesium (250 mg twice a day or 500 mg at bed) has a relaxant effect on smooth muscles such as blood vessels. Individuals suffering from frequent or daily headache usually have low magnesium levels which can be increase with daily supplementation of 400-750 mg. Three trials found 40-90% average headache reduction  when used as a preventative. Magnesium may help with headaches are aura, the best evidence for magnesium is for migraine with aura is its thought to stop the cortical spreading depression we believe is the pathophysiology of migraine aura.Magnesium also demonstrated the benefit in menstrually related migraine.  Magnesium is part of the messenger system in the serotonin cascade and it is a good muscle relaxant.  It is also useful for constipation which can be a side effect of other medications used to treat migraine. Good sources include nuts, whole grains, and tomatoes. Side Effects: loose stool/diarrhea  Riboflavin (vitamin B 2) 200 mg twice a day. This vitamin assists nerve cells in the production of ATP a principal energy storing molecule.  It is necessary for many chemical reactions in the body.  There have been at least 3 clinical trials of riboflavin using 400 mg per day all of which suggested that migraine frequency can be decreased.  All 3 trials showed significant improvement in over half of migraine sufferers.  The supplement is found in bread, cereal, milk, meat, and poultry.  Most Americans get more riboflavin than the recommended daily allowance, however riboflavin deficiency is not necessary  for the supplements to help prevent headache. Side effects: energizing, green urine   Coenzyme Q10: This is present in almost all cells in the body and is critical component for the conversion of energy.  Recent studies have shown that a nutritional supplement of CoQ10 can reduce the frequency of migraine attacks by improving the energy  production of cells as with riboflavin.  Doses of 150 mg twice a day have been shown to be effective.   Melatonin: Increasing evidence shows correlation between melatonin secretion and headache conditions.  Melatonin supplementation has decreased headache intensity and duration.  It is widely used as a sleep aid.  Sleep is natures way of dealing with migraine.  A dose of 3 mg is recommended to start for headaches including cluster headache. Higher doses up to 15 mg has been reviewed for use in Cluster headache and have been used. The rationale behind using melatonin for cluster is that many theories regarding the cause of Cluster headache center around the disruption of the normal circadian rhythm in the brain.  This helps restore the normal circadian rhythm.   HEADACHE DIET: Foods and beverages which may trigger migraine Note that only 20% of headache patients are food sensitive. You will know if you are food sensitive if you get a headache consistently 20 minutes to 2 hours after eating a certain food. Only cut out a food if it causes headaches, otherwise you might remove foods you enjoy! What matters most for diet is to eat a well balanced healthy diet full of vegetables and low fat protein, and to not miss meals.   Chocolate, other sweets ALL cheeses except cottage and cream cheese Dairy products, yogurt, sour cream, ice cream Liver Meat extracts (Bovril, Marmite, meat tenderizers) Meats or fish which have undergone aging, fermenting, pickling or smoking. These include: Hotdogs,salami,Lox,sausage, mortadellas,smoked salmon, pepperoni, Pickled herring Pods of broad bean (English beans, Chinese pea pods, [place] (fava) beans, lima and navy beans Ripe avocado, ripe banana Yeast extracts or active yeast preparations such as Brewer's or Fleishman's (commercial bakes goods are permitted) Tomato based foods, pizza (lasagna, etc.)   MSG (monosodium glutamate) is disguised as many things; look for  these common aliases: Monopotassium glutamate Autolysed yeast Hydrolysed protein Sodium caseinate "flavorings" "all natural preservatives" Nutrasweet   Avoid all other foods that convincingly provoke headaches.   Resources: The Dizzy [place]air Laundry Your Headache Diet, migrainestrong.com  https://zamora-andrews.com/   Caffeine and Migraine For patients that have migraine, caffeine intake more than 3 days per week can lead to dependency and increased migraine frequency. I would recommend cutting back on your caffeine intake as best you can. The recommended amount of caffeine is 200-300 mg daily, although migraine patients may experience dependency at even lower doses. While you may notice an increase in headache temporarily, cutting back will be helpful for headaches in the long run. For more information on caffeine and migraine, visit: https://americanmigrainefoundation.org/resource-library/caffeine-and-migraine/   Headache Prevention Strategies:   1. Maintain a headache diary; learn to identify and avoid triggers.  - This can be a simple note where you log when you had a headache, associated symptoms, and medications used - There are several smartphone apps developed to help track migraines: Migraine Buddy, Migraine Monitor, Curelator N1-Headache App   Common triggers include: Emotional triggers: Emotional/Upset family or friends Emotional/Upset occupation Business reversal/success Anticipation anxiety Crisis-serious Post-crisis periodNew job/position   Physical triggers: Vacation Day Weekend Strenuous Exercise High Altitude Location New Move Menstrual Day Physical Illness Oversleep/Not enough sleep Weather  changes Light: Photophobia or light sesnitivity treatment involves a balance between desensitization and reduction in overly strong input. Use dark polarized glasses outside, but not inside. Avoid bright or fluorescent light, but  do not dim environment to the point that going into a normally lit room hurts. Consider FL-41 tint lenses, which reduce the most irritating wavelengths without blocking too much light.  These can be obtained at axonoptics.com or theraspecs.com Foods: see list above.   2. Limit use of acute treatments (over-the-counter medications, triptans, etc.) to no more than 2 days per week or 10 days per month to prevent medication overuse headache (rebound headache).     3. Follow a regular schedule (including weekends and holidays): Don't skip meals. Eat a balanced diet. 8 hours of sleep nightly. Minimize stress. Exercise 30 minutes per day. Being overweight is associated with a 5 times increased risk of chronic migraine. Keep well hydrated and drink 6-8 glasses of water per day.   4. Initiate non-pharmacologic measures at the earliest onset of your headache. Rest and quiet environment. Relax and reduce stress. Breathe2Relax is a free app that can instruct you on    some simple relaxtion and breathing techniques. Http://Dawnbuse.com is a    free website that provides teaching videos on relaxation.  Also, there are  many apps that   can be downloaded for "mindful" relaxation.  An app called YOGA NIDRA will help walk you through mindfulness. Another app called Calm can be downloaded to give you a structured mindfulness guide with daily reminders and skill development. Headspace for guided meditation Mindfulness Based Stress Reduction Online Course: www.palousemindfulness.com Cold compresses.   5. Don't wait!! Take the maximum allowable dosage of prescribed medication at the first sign of migraine.   6. Compliance:  Take prescribed medication regularly as directed and at the first sign of a migraine.   7. Communicate:  Call your physician when problems arise, especially if your headaches change, increase in frequency/severity, or become associated with neurological symptoms (weakness, numbness, slurred  speech, etc.). Proceed to emergency room if you experience new or worsening symptoms or symptoms do not resolve, if you have new neurologic symptoms or if headache is severe, or for any concerning symptom.   8. Headache/pain management therapies: Consider various complementary methods, including medication, behavioral therapy, psychological counselling, biofeedback, massage therapy, acupuncture, dry needling, and other modalities.  Such measures may reduce the need for medications. Counseling for pain management, where patients learn to function and ignore/minimize their pain, seems to work very well.   9. Recommend changing family's attention and focus away from patient's headaches. Instead, emphasize daily activities. If first question of day is 'How are your headaches/Do you have a headache today?', then patient will constantly think about headaches, thus making them worse. Goal is to re-direct attention away from headaches, toward daily activities and other distractions.   10. Helpful Websites: www.AmericanHeadacheSociety.org PatentHood.ch www.headaches.org TightMarket.nl www.achenet.org

## 2024-01-08 NOTE — Progress Notes (Signed)
PATIENT: Erin Sexton DOB: [birth year]  REASON FOR VISIT: follow up HISTORY FROM: patient  Virtual Visit via Telephone Note  I connected with Erin Sexton on 01/09/24 at 10:30 AM EST by telephone and verified that I am speaking with the correct person using two identifiers.   I discussed the limitations, risks, security and privacy concerns of performing an evaluation and management service by telephone and the availability of in person appointments. I also discussed with the patient that there may be a patient responsible charge related to this service. The patient expressed understanding and agreed to proceed.   History of Present Illness:  01/09/24 ALL (Mychart): Erin Sexton returns for follow up for migraines. She continues topiramate and Nurtec. She reports headaches are well managed on current treatment. She did have an intractable migraine last week that did not seem as responsive to Nurtec. She had nausea and vomiting with headache. Headache resolved with rest and time. A1C is under 6. She is down to 241lbs. She continues close follow up with PCP.    12/10/2022 ALL (Mychart): Erin Sexton is a 53 y.o. female here today for follow up for migraines. She was seen in consult with Dr Delena Bali 08/2022 and started on topiramate and Nurtec. MRI was unremarkable. Since, she reports headaches have decreased in intensity and frequency. She may have 2-3 headaches a month. She is not using Nurtec regularly but reports it does work well when needed. Her A1C is now 6.1.    History (copied from Dr Quentin Mulling previous note)  HPI:  Medical co-morbidities: HTN, HOCM, TIA, asthma, DM2, hypothyroidism, OSA, HLD   The patient presents for evaluation of headaches which began in July 2023. She used to have migraines in her 26s, but they had improved for several years until recently. Headaches are associated with nausea and vomiting. She will have a visual aura prior to her migraines which will last for  10 minutes at a time. She is currently averaging one migraine every 3 weeks. Headaches can last 4 days to 1 week at a time.   She has tried Imitrex injections which have been ineffective. Excedrin will help take the edge off if she takes it early enough.   Has a history of TIA with left facial droop ~3 years ago.   Headache History: Onset: July 2023 Triggers: Aura: zigzags in vision, visual distortion (looking through a fishbowl) Location: left temple Associated Symptoms:             Photophobia: no             Phonophobia: no             Nausea: yes Vomiting: yes Worse with activity?: yes Duration of headaches: 4 days   Headache days per month: 7 Headache free days per month: 23   Current Treatment: Abortive Imitrex Excedrin   Preventative none   Prior Therapies                                 Fioricet Imitrex Triptans contraindicated due to TIA magnesium Lisinopril 2.5 mg daily Metoprolol 50 mg BID Cymbalta 60 mg daily Wellbutrin   Observations/Objective:  Generalized: Well developed, in no acute distress  Mentation: Alert oriented to time, place, history taking. Follows all commands speech and language fluent   Assessment and Plan:  53 y.o. year old female  has a past medical history of Abnormal CT scan, Anemia, Anxiety,  Arthritis, Asthma, Blood transfusion without reported diagnosis, Bone spur of right foot, CHF (congestive heart failure) (HCC), COPD (chronic obstructive pulmonary disease) (HCC), Depression, Diabetes mellitus without complication (HCC), Heart murmur, History of echocardiogram (2015), HOCM (hypertrophic obstructive cardiomyopathy) (HCC), Hyperlipidemia, Hypertension, Morbid obesity (HCC), NSVT (nonsustained ventricular tachycardia) (HCC), Stroke (HCC), SVT (supraventricular tachycardia) (HCC), Thyroid disease, and Vaginal bleeding. here with    ICD-10-CM   1. Chronic migraine with aura without status migrainosus, not intractable  G43.E09        [name] is doing well on topiramate. Headaches are significantly improved. She will continue topiramate 100mg  daily and Nurtec daily as needed. Healthy lifestyle habits encouraged. She will follow up with me in 1 year, sooner if needed.   No orders of the defined types were placed in this encounter.   Meds ordered this encounter  Medications   topiramate (TOPAMAX) 100 MG tablet    Sig: Take 1 tablet (100 mg total) by mouth daily.    Dispense:  90 tablet    Refill:  3    Supervising Provider:   Anson Fret [[id number]]   Rimegepant Sulfate (NURTEC) 75 MG TBDP    Sig: Take 1 tablet (75 mg total) by mouth as needed.    Dispense:  8 tablet    Refill:  11    Supervising Provider:   Anson Fret [[id number]]     Follow Up Instructions:  I discussed the assessment and treatment plan with the patient. The patient was provided an opportunity to ask questions and all were answered. The patient agreed with the plan and demonstrated an understanding of the instructions.   The patient was advised to call back or seek an in-person evaluation if the symptoms worsen or if the condition fails to improve as anticipated.  I provided 15 minutes of non-face-to-face time during this encounter. Patient located at their place of residence during Mychart visit. Provider is in the office.    Shawnie Dapper, NP

## 2024-01-09 ENCOUNTER — Telehealth (INDEPENDENT_AMBULATORY_CARE_PROVIDER_SITE_OTHER): Payer: Medicare Other | Admitting: Family Medicine

## 2024-01-09 ENCOUNTER — Encounter: Payer: Self-pay | Admitting: Family Medicine

## 2024-01-09 DIAGNOSIS — G43E09 Chronic migraine with aura, not intractable, without status migrainosus: Secondary | ICD-10-CM

## 2024-01-09 MED ORDER — TOPIRAMATE 100 MG PO TABS: 100.0000 mg | ORAL_TABLET | Freq: Every day | ORAL | 3 refills | Status: AC

## 2024-01-09 MED ORDER — NURTEC 75 MG PO TBDP: 75.0000 mg | ORAL_TABLET | ORAL | 11 refills | Status: AC | PRN

## 2024-02-16 ENCOUNTER — Encounter: Payer: Self-pay | Admitting: Internal Medicine

## 2024-02-16 ENCOUNTER — Ambulatory Visit (INDEPENDENT_AMBULATORY_CARE_PROVIDER_SITE_OTHER): Payer: Medicare Other | Admitting: Internal Medicine

## 2024-02-16 ENCOUNTER — Encounter (HOSPITAL_COMMUNITY): Payer: Self-pay

## 2024-02-16 VITALS — BP 108/75 | HR 78 | Ht 69.0 in | Wt 244.6 lb

## 2024-02-16 DIAGNOSIS — J4540 Moderate persistent asthma, uncomplicated: Secondary | ICD-10-CM

## 2024-02-16 DIAGNOSIS — I421 Obstructive hypertrophic cardiomyopathy: Secondary | ICD-10-CM | POA: Diagnosis not present

## 2024-02-16 DIAGNOSIS — E559 Vitamin D deficiency, unspecified: Secondary | ICD-10-CM | POA: Diagnosis not present

## 2024-02-16 DIAGNOSIS — Z7984 Long term (current) use of oral hypoglycemic drugs: Secondary | ICD-10-CM | POA: Diagnosis not present

## 2024-02-16 DIAGNOSIS — I5032 Chronic diastolic (congestive) heart failure: Secondary | ICD-10-CM

## 2024-02-16 DIAGNOSIS — E1169 Type 2 diabetes mellitus with other specified complication: Secondary | ICD-10-CM

## 2024-02-16 DIAGNOSIS — N1831 Chronic kidney disease, stage 3a: Secondary | ICD-10-CM | POA: Diagnosis not present

## 2024-02-16 DIAGNOSIS — F40240 Claustrophobia: Secondary | ICD-10-CM | POA: Diagnosis not present

## 2024-02-16 DIAGNOSIS — Z23 Encounter for immunization: Secondary | ICD-10-CM | POA: Diagnosis not present

## 2024-02-16 DIAGNOSIS — I10 Essential (primary) hypertension: Secondary | ICD-10-CM

## 2024-02-16 DIAGNOSIS — E039 Hypothyroidism, unspecified: Secondary | ICD-10-CM | POA: Diagnosis not present

## 2024-02-16 DIAGNOSIS — F3341 Major depressive disorder, recurrent, in partial remission: Secondary | ICD-10-CM

## 2024-02-16 DIAGNOSIS — E65 Localized adiposity: Secondary | ICD-10-CM

## 2024-02-16 MED ORDER — TIRZEPATIDE 15 MG/0.5ML ~~LOC~~ SOAJ: 15.0000 mg | SUBCUTANEOUS | 1 refills | Status: DC

## 2024-02-16 MED ORDER — LORAZEPAM 0.5 MG PO TABS: ORAL_TABLET | ORAL | 0 refills | Status: DC

## 2024-02-16 MED ORDER — LISINOPRIL 2.5 MG PO TABS: 2.5000 mg | ORAL_TABLET | Freq: Every day | ORAL | 3 refills | Status: DC

## 2024-02-16 NOTE — Assessment & Plan Note (Signed)
Well-controlled with Symbicort and as needed albuterol ?

## 2024-02-16 NOTE — Assessment & Plan Note (Signed)
 BP Readings from Last 1 Encounters:  02/16/24 108/75   Well-controlled with lisinopril and Metoprolol Counseled for compliance with the medications Advised DASH diet and moderate exercise/walking, at least 150 mins/week

## 2024-02-16 NOTE — Assessment & Plan Note (Signed)
 Last CMP reviewed, GFR stable around 50-55 Avoid nephrotoxic agents On lisinopril and Jardiance Recheck CMP

## 2024-02-16 NOTE — Assessment & Plan Note (Signed)
 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

## 2024-02-16 NOTE — Assessment & Plan Note (Signed)
Noted on recent echo ?Followed by cardiology - plan to get cardiac MRI ?On lisinopril, Jardiance and torsemide for HFpEF ?

## 2024-02-16 NOTE — Assessment & Plan Note (Signed)
 Continue vitamin D supplement

## 2024-02-16 NOTE — Patient Instructions (Addendum)
 Please start taking Mounjaro 15 mg once weekly.  Please continue to take other medications as prescribed.  Please continue to follow low carb diet and perform moderate exercise/walking at least 150 mins/week.

## 2024-02-16 NOTE — Assessment & Plan Note (Addendum)
 Lab Results  Component Value Date   HGBA1C 5.2 10/17/2023   Well controlled now, was 11.7 in 2021 Associated with HTN, CHF and HLD On Mounjaro 12.5 mg qw and Jardiance 10 mg QD Patient is tolerating Mounjaro and is willing to increase dose - Increased dose of Mounjaro to 15 mg qw  Home blood glucose readings reviewed -mostly between 90-120 Advised to follow diabetic diet On statin and ACEi F/u CMP and lipid panel Diabetic eye exam: Advised to follow up with Ophthalmology for diabetic eye exam

## 2024-02-16 NOTE — Progress Notes (Signed)
 Established Patient Office Visit  Subjective:  Patient ID: Erin Sexton, female    DOB: 12-13-1970  Age: 53 y.o. MRN: [id number]  CC:  Chief Complaint  Patient presents with   Care Management    4 month f/u   Diabetes   Hypertension    HPI Erin Sexton is a 53 y.o. female with past medical history of HTN, type II DM, hypothyroidism, HLD, OSA, asthma and morbid obesity who presents for f/u of her chronic medical conditions.  Type II DM: Her blood glucose has been better controlled with Mounjaro 12.5 mg qw now. She also takes Jardiance 10 mg once daily for HOCM/CHF.  Denies any polyuria or polydipsia currently.  She also takes statin for HLD. She has lost additional 5 lbs since the last visit, total 27 lbs with Mounjaro. She had lost about 30 lbs with Trulicity.  She is also doing intermittent fasting.  CKD: Her last CMP showed GFR of 49.  She denies any dysuria, hematuria or urinary hesitance or resistance.  Migraine: She had neurology evaluation for migraine.  She has been taking Topamax and as needed Nurtec.  Her headache frequency and intensity have improved now.  HTN and HOCM: BP is well-controlled. Takes medications regularly. Patient denies headache, dizziness, chest pain, or palpitations.  She follows up with cardiology for HOCM.  She has chronic leg swelling and exertional dyspnea, but have improved with weight loss. She has cardiac MRI scheduled, but requests medicine to help with claustrophobia.   Hypothyroidism: She has been taking Levothyroxine 125 mcg QD.  She has noticed slowing of weight loss since decreasing levothyroxine dose.  Denies any recent change in appetite, tremors or palpitations.  She has abdominal skin sagging due to weight loss.  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  She agrees for plastic surgery consultation for it.  She is currently using Lotrisone cream for intertrigo.   Past Medical History:  Diagnosis Date    Abnormal CT scan    a. 02/2014: evaluated for dyspnea and initially placed on Xarelto for PE because radiologist could not exclude small peripheral pulmonary emboli because of limited contrast. However, after admission for ++vaginal bleeding several days later, it was felt she did NOT have PE - underwent further testing with neg VQ 03/06/14, neg LE duplex.   Anemia    Anxiety    Arthritis    Asthma    Blood transfusion without reported diagnosis    Bone spur of right foot    CHF (congestive heart failure) (HCC)    COPD (chronic obstructive pulmonary disease) (HCC)    Depression    Diabetes mellitus without complication (HCC)    a. Dx 02/2014.   Heart murmur    History of echocardiogram 2015   normal EF, Grade II diastolic dysfunction   HOCM (hypertrophic obstructive cardiomyopathy) (HCC)    a. Dx 40981.   Hyperlipidemia    Hypertension    Morbid obesity (HCC)    NSVT (nonsustained ventricular tachycardia) (HCC)    a. Noted on tele during 02/2014 adm for symptomatic anemia.   Stroke [facility])    SVT (supraventricular tachycardia) (HCC)    a. Noted on tele during 02/2014 adm for symptomatic anemia.   Thyroid disease    Vaginal bleeding     Past Surgical History:  Procedure Laterality Date   COLONOSCOPY N/A 09/30/2019   Procedure: COLONOSCOPY;  Surgeon: Malissa Hippo, MD;  Location: [facility] SUITE;  Service: Endoscopy;  Laterality:  N/A;  730   DILITATION & CURRETTAGE/HYSTROSCOPY WITH NOVASURE ABLATION N/A 08/07/2016   Procedure: DILATATION & CURETTAGE/HYSTEROSCOPY WITH ATTPEMPTED NOVASURE ABLATION AND INSERTION OF IUD;  Surgeon: Lazaro Arms, MD;  Location: [facility] ORS;  Service: Gynecology;  Laterality: N/A;    Family History  Problem Relation Age of Onset   Diabetes Mother    Arthritis Mother    Other Mother        WPW   Mental illness Mother        Depression   Asthma Mother    Obesity Mother    Heart disease Father        History of coronary bypass grafting at age 38    Hypertension Father    Mental illness Sister    Bipolar disorder Sister    Heart disease Sister        possibly WPW   Depression Sister    Varicose Veins Sister    Uterine cancer Maternal Grandmother    Mental illness Maternal Grandmother    Arthritis Maternal Grandmother    Hyperlipidemia Maternal Grandmother    Heart disease Maternal Grandfather    CAD Neg Hx        No family history of EARLY CAD    Social History   Socioeconomic History   Marital status: Divorced    Spouse name: Not on file   Number of children: 0   Years of education: Not on file   Highest education level: Associate degree: academic program  Occupational History   Occupation: Disabled  Tobacco Use   Smoking status: Never   Smokeless tobacco: Never  Vaping Use   Vaping status: Never Used  Substance and Sexual Activity   Alcohol use: No   Drug use: No   Sexual activity: Not Currently    Birth control/protection: I.U.D., Post-menopausal    Comment: IUD in place to control vaginal bleeding  Other Topics Concern   Not on file  Social History Narrative   Divorced for 3 years,married for 5 years.On disability since 2009 secondary to mental illness.   Social Drivers of Corporate investment banker Strain: Low Risk  (02/12/2024)   Overall Financial Resource Strain (CARDIA)    Difficulty of Paying Living Expenses: Not hard at all  Food Insecurity: No Food Insecurity (02/12/2024)   Hunger Vital Sign    Worried About Running Out of Food in the Last Year: Never true    Ran Out of Food in the Last Year: Never true  Transportation Needs: No Transportation Needs (02/12/2024)   PRAPARE - Administrator, Civil Service (Medical): No    Lack of Transportation (Non-Medical): No  Physical Activity: Sufficiently Active (02/12/2024)   Exercise Vital Sign    Days of Exercise per Week: 5 days    Minutes of Exercise per Session: 30 min  Stress: Stress Concern Present (02/12/2024)   [employer] of  Occupational Health - Occupational Stress Questionnaire    Feeling of Stress : To some extent  Social Connections: Socially Isolated (02/12/2024)   Social Connection and Isolation Panel [NHANES]    Frequency of Communication with Friends and Family: Twice a week    Frequency of Social Gatherings with Friends and Family: Never    Attends Religious Services: Never    Database administrator or Organizations: No    Attends Banker Meetings: Never    Marital Status: Divorced  Catering manager Violence: Not At Risk (10/01/2023)   Humiliation, Afraid, Rape,  and Kick questionnaire    Fear of Current or Ex-Partner: No    Emotionally Abused: No    Physically Abused: No    Sexually Abused: No    Outpatient Medications Prior to Visit  Medication Sig Dispense Refill   Accu-Chek Softclix Lancets lancets USE 1 LANCET TO CHECK GLUCOSE ONCE DAILY 100 each 3   albuterol (PROVENTIL) (5 MG/ML) 0.5% nebulizer solution Take 0.5 mLs (2.5 mg total) by nebulization every 6 (six) hours as needed for wheezing or shortness of breath. 20 mL 0   albuterol (VENTOLIN HFA) 108 (90 Base) MCG/ACT inhaler Inhale 2 puffs into the lungs as needed (wheezing).      atorvastatin (LIPITOR) 80 MG tablet Take 1 tablet (80 mg total) by mouth daily. 90 tablet 3   blood glucose meter kit and supplies KIT Dispense based on patient and insurance preference. Use once a day. Diagnosis code:E11.9 1 each 0   buPROPion (WELLBUTRIN XL) 300 MG 24 hr tablet Take 1 tablet (300 mg total) by mouth daily. 90 tablet 3   Cholecalciferol (VITAMIN D3 PO) Take 10,000 Units by mouth daily.     clindamycin (CLEOCIN T) 1 % lotion      clotrimazole-betamethasone (LOTRISONE) cream APPLY  CREAM TOPICALLY TO AFFECTED AREA TWICE DAILY 30 g 2   empagliflozin (JARDIANCE) 10 MG TABS tablet Take 1 tablet (10 mg total) by mouth daily. 90 tablet 3   estradiol (ESTRACE) 2 MG tablet TAKE 1 TABLET BY MOUTH AT BEDTIME 90 tablet 3   glucose blood (ACCU-CHEK  GUIDE) test strip USE 1 STRIP TO CHECK GLUCOSE ONCE DAILY 100 each 0   levocetirizine (XYZAL) 5 MG tablet Take 1 tablet (5 mg total) by mouth every evening. 90 tablet 3   levonorgestrel (MIRENA) 20 MCG/24HR IUD 1 each by Intrauterine route once.     levothyroxine (SYNTHROID) 125 MCG tablet Take 1 tablet (125 mcg total) by mouth daily. 90 tablet 1   megestrol (MEGACE) 40 MG tablet Take 1 tablet (40 mg total) by mouth daily. 30 tablet 11   metoprolol tartrate (LOPRESSOR) 50 MG tablet Take 1 tablet by mouth twice daily 180 tablet 0   nystatin (MYCOSTATIN/NYSTOP) powder Apply 1 Application topically 3 (three) times daily. 60 g 0   Rimegepant Sulfate (NURTEC) 75 MG TBDP Take 1 tablet (75 mg total) by mouth as needed. 8 tablet 11   SYMBICORT 160-4.5 MCG/ACT inhaler Inhale 2 puffs into the lungs as needed. 1 each 2   topiramate (TOPAMAX) 100 MG tablet Take 1 tablet (100 mg total) by mouth daily. 90 tablet 3   lisinopril (ZESTRIL) 2.5 MG tablet Take 1 tablet (2.5 mg total) by mouth daily. 90 tablet 3   tirzepatide (MOUNJARO) 12.5 MG/0.5ML Pen Inject 12.5 mg into the skin once a week. 6 mL 0   No facility-administered medications prior to visit.    No Known Allergies  ROS Review of Systems  Constitutional:  Negative for chills and fever.  HENT:  Negative for congestion, sinus pressure, sinus pain and sore throat.   Eyes:  Negative for pain and discharge.  Respiratory:  Negative for cough and shortness of breath.   Cardiovascular:  Positive for leg swelling. Negative for chest pain and palpitations.  Gastrointestinal:  Negative for abdominal pain, diarrhea, nausea and vomiting.  Endocrine: Negative for polydipsia and polyuria.  Genitourinary:  Negative for dysuria and hematuria.  Musculoskeletal:  Positive for arthralgias. Negative for neck pain and neck stiffness.  Skin:  Negative for rash.  Neurological:  Negative for dizziness and weakness.  Psychiatric/Behavioral:  Negative for agitation and  behavioral problems.       Objective:    Physical Exam Vitals reviewed.  Constitutional:      General: She is not in acute distress.    Appearance: She is obese. She is not diaphoretic.  HENT:     Head: Normocephalic and atraumatic.     Nose: Nose normal. No congestion.     Mouth/Throat:     Mouth: Mucous membranes are moist.     Pharynx: No posterior oropharyngeal erythema.  Eyes:     General: No scleral icterus.    Extraocular Movements: Extraocular movements intact.  Cardiovascular:     Rate and Rhythm: Normal rate and regular rhythm.     Heart sounds: Normal heart sounds. No murmur heard. Pulmonary:     Breath sounds: Normal breath sounds. No wheezing or rales.  Abdominal:     Palpations: Abdomen is soft.     Tenderness: There is no abdominal tenderness.     Hernia: A hernia (Umbilical) is present.     Comments: Abdominal skin sagging - panniculus  Musculoskeletal:     Cervical back: Neck supple. No tenderness.     Right lower leg: No edema.     Left lower leg: No edema.  Skin:    General: Skin is warm.     Findings: No rash.     Comments: Varicose veins on LE bilaterally  Neurological:     General: No focal deficit present.     Mental Status: She is alert and oriented to person, place, and time.  Psychiatric:        Mood and Affect: Mood normal.        Behavior: Behavior normal.     BP 108/75   Pulse 78   Ht 5\' 9"  (1.753 m)   Wt 244 lb 9.6 oz (110.9 kg)   SpO2 99%   BMI 36.12 kg/m  Wt Readings from Last 3 Encounters:  02/16/24 244 lb 9.6 oz (110.9 kg)  12/24/23 248 lb (112.5 kg)  11/12/23 247 lb (112 kg)    Lab Results  Component Value Date   TSH 1.840 10/17/2023   Lab Results  Component Value Date   WBC 9.0 10/17/2023   HGB 17.4 (H) 11/12/2023   HCT 49.7 (H) 10/17/2023   MCV 97 10/17/2023   PLT 212 10/17/2023   Lab Results  Component Value Date   NA 144 10/17/2023   K 4.0 10/17/2023   CO2 17 (L) 10/17/2023   GLUCOSE 76 10/17/2023    BUN 21 10/17/2023   CREATININE 1.30 (H) 10/17/2023   BILITOT 0.7 10/17/2023   ALKPHOS 110 10/17/2023   AST 24 10/17/2023   ALT 28 10/17/2023   PROT 6.4 10/17/2023   ALBUMIN 4.0 10/17/2023   CALCIUM 9.1 10/17/2023   ANIONGAP 7 07/21/2022   EGFR 49 (L) 10/17/2023   Lab Results  Component Value Date   CHOL 95 (L) 06/16/2023   Lab Results  Component Value Date   HDL 34 (L) 06/16/2023   Lab Results  Component Value Date   LDLCALC 45 06/16/2023   Lab Results  Component Value Date   TRIG 78 06/16/2023   Lab Results  Component Value Date   CHOLHDL 2.8 06/16/2023   Lab Results  Component Value Date   HGBA1C 5.2 10/17/2023      Assessment & Plan:   Problem List Items Addressed This Visit  Cardiovascular and Mediastinum   Essential hypertension, benign (Chronic)   BP Readings from Last 1 Encounters:  02/16/24 108/75   Well-controlled with lisinopril and Metoprolol Counseled for compliance with the medications Advised DASH diet and moderate exercise/walking, at least 150 mins/week      Relevant Medications   lisinopril (ZESTRIL) 2.5 MG tablet   HOCM (hypertrophic obstructive cardiomyopathy) (HCC) (Chronic)   Noted on recent echo Followed by cardiology - plan to get cardiac MRI On lisinopril, Jardiance and torsemide for HFpEF      Relevant Medications   lisinopril (ZESTRIL) 2.5 MG tablet   Chronic diastolic CHF (congestive heart failure) (HCC) (Chronic)   Noted on recent echo Followed by cardiology On lisinopril, Jardiance for HFpEF      Relevant Medications   lisinopril (ZESTRIL) 2.5 MG tablet     Respiratory   Asthma, chronic   Well-controlled with Symbicort and as needed albuterol        Endocrine   Type 2 diabetes mellitus with other specified complication (HCC) - Primary   Lab Results  Component Value Date   HGBA1C 5.2 10/17/2023   Well controlled now, was 11.7 in 2021 Associated with HTN, CHF and HLD On Mounjaro 12.5 mg qw and  Jardiance 10 mg QD Patient is tolerating Mounjaro and is willing to increase dose - Increased dose of Mounjaro to 15 mg qw  Home blood glucose readings reviewed -mostly between 90-120 Advised to follow diabetic diet On statin and ACEi F/u CMP and lipid panel Diabetic eye exam: Advised to follow up with Ophthalmology for diabetic eye exam      Relevant Medications   lisinopril (ZESTRIL) 2.5 MG tablet   tirzepatide (MOUNJARO) 15 MG/0.5ML Pen   Other Relevant Orders   Hemoglobin A1c   Hypothyroidism   Lab Results  Component Value Date   TSH 1.840 10/17/2023   On levothyroxine 125 mcg daily now Had elevated free T4 in the last visit, Levothyroxine dose was reduced -  recheck TSH and free T4      Relevant Orders   TSH + free T4     Genitourinary   Stage 3a chronic kidney disease (HCC)   Last CMP reviewed, GFR stable around 50-55 Avoid nephrotoxic agents On lisinopril and Jardiance Recheck CMP      Relevant Orders   CMP14+EGFR     Other   Vitamin D deficiency   Continue vitamin D supplement      Recurrent major depressive disorder, in partial remission (HCC)   Well controlled with bupropion 300 mg QD      Relevant Medications   LORazepam (ATIVAN) 0.5 MG tablet   Abdominal panniculus, symptomatic   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    Dispense:  90 tablet    Refill:  3   tirzepatide (MOUNJARO) 15 MG/0.5ML Pen    Sig: Inject 15 mg into the skin once a week.    Dispense:  6 mL    Refill:  1   LORazepam (ATIVAN) 0.5 MG tablet    Sig: Take  1 tablet 30 minutes prior to procedure. Please take additional  tablet for persistent anxiety.    Dispense:  2 tablet    Refill:  0    Follow-up: Return in about 4 months (around 06/17/2024) for DM and CKD.    Anabel Halon, MD

## 2024-02-16 NOTE — Assessment & Plan Note (Addendum)
 Lab Results  Component Value Date   TSH 1.840 10/17/2023   On levothyroxine 125 mcg daily now Had elevated free T4 in the last visit, Levothyroxine dose was reduced -  recheck TSH and free T4

## 2024-02-16 NOTE — Assessment & Plan Note (Signed)
 Well controlled with bupropion 300 mg QD

## 2024-02-16 NOTE — Assessment & Plan Note (Signed)
 Noted on recent echo Followed by cardiology On lisinopril, Jardiance for HFpEF

## 2024-02-17 LAB — CMP14+EGFR
ALT: 46 IU/L — ABNORMAL HIGH (ref 0–32)
AST: 33 IU/L (ref 0–40)
Albumin: 4.2 g/dL (ref 3.8–4.9)
Alkaline Phosphatase: 109 IU/L (ref 44–121)
BUN/Creatinine Ratio: 16 (ref 9–23)
BUN: 22 mg/dL (ref 6–24)
Bilirubin Total: 0.7 mg/dL (ref 0.0–1.2)
CO2: 20 mmol/L (ref 20–29)
Calcium: 8.8 mg/dL (ref 8.7–10.2)
Chloride: 111 mmol/L — ABNORMAL HIGH (ref 96–106)
Creatinine, Ser: 1.36 mg/dL — ABNORMAL HIGH (ref 0.57–1.00)
Globulin, Total: 2.2 g/dL (ref 1.5–4.5)
Glucose: 82 mg/dL (ref 70–99)
Potassium: 4.2 mmol/L (ref 3.5–5.2)
Sodium: 144 mmol/L (ref 134–144)
Total Protein: 6.4 g/dL (ref 6.0–8.5)
eGFR: 46 mL/min/{1.73_m2} — ABNORMAL LOW (ref >59)

## 2024-02-17 LAB — TSH+FREE T4
Free T4: 2.36 ng/dL — ABNORMAL HIGH (ref 0.82–1.77)
TSH: 1.95 u[IU]/mL (ref 0.450–4.500)

## 2024-02-17 LAB — HEMOGLOBIN A1C
Est. average glucose Bld gHb Est-mCnc: 103 mg/dL
Hgb A1c MFr Bld: 5.2 % (ref 4.8–5.6)

## 2024-02-17 MED ORDER — LEVOTHYROXINE SODIUM 112 MCG PO TABS: 112.0000 ug | ORAL_TABLET | Freq: Every day | ORAL | 1 refills | Status: DC

## 2024-02-17 NOTE — Addendum Note (Signed)
 Addended byTrena Platt on: 02/17/2024 08:04 AM   Modules accepted: Orders

## 2024-02-18 ENCOUNTER — Ambulatory Visit (HOSPITAL_COMMUNITY)
Admission: RE | Admit: 2024-02-18 | Discharge: 2024-02-18 | Disposition: A | Discharge status: Home / Self Care | Payer: Medicare Other | Source: Ambulatory Visit | Attending: Nurse Practitioner | Admitting: Nurse Practitioner

## 2024-02-18 ENCOUNTER — Other Ambulatory Visit: Payer: Self-pay | Admitting: Nurse Practitioner

## 2024-02-18 DIAGNOSIS — I421 Obstructive hypertrophic cardiomyopathy: Secondary | ICD-10-CM

## 2024-02-18 DIAGNOSIS — Z8673 Personal history of transient ischemic attack (TIA), and cerebral infarction without residual deficits: Secondary | ICD-10-CM | POA: Insufficient documentation

## 2024-02-18 MED ORDER — GADOBUTROL 1 MMOL/ML IV SOLN
10.0000 mL | Freq: Once | INTRAVENOUS | Status: AC | PRN
  Administered 2024-02-18: 10 mL via INTRAVENOUS

## 2024-02-23 ENCOUNTER — Other Ambulatory Visit: Payer: Self-pay | Admitting: Internal Medicine

## 2024-02-23 ENCOUNTER — Encounter: Payer: Self-pay | Admitting: Internal Medicine

## 2024-02-23 DIAGNOSIS — L03114 Cellulitis of left upper limb: Secondary | ICD-10-CM

## 2024-02-23 MED ORDER — CEPHALEXIN 500 MG PO CAPS: 500.0000 mg | ORAL_CAPSULE | Freq: Three times a day (TID) | ORAL | 0 refills | Status: DC

## 2024-02-27 ENCOUNTER — Other Ambulatory Visit

## 2024-02-27 ENCOUNTER — Encounter: Payer: Self-pay | Admitting: Nurse Practitioner

## 2024-02-27 ENCOUNTER — Ambulatory Visit: Attending: Nurse Practitioner | Admitting: Nurse Practitioner

## 2024-02-27 ENCOUNTER — Other Ambulatory Visit: Payer: Self-pay | Admitting: Nurse Practitioner

## 2024-02-27 VITALS — BP 130/86 | HR 78 | Ht 69.0 in | Wt 243.2 lb

## 2024-02-27 DIAGNOSIS — I10 Essential (primary) hypertension: Secondary | ICD-10-CM | POA: Insufficient documentation

## 2024-02-27 DIAGNOSIS — Z8673 Personal history of transient ischemic attack (TIA), and cerebral infarction without residual deficits: Secondary | ICD-10-CM | POA: Diagnosis not present

## 2024-02-27 DIAGNOSIS — I6523 Occlusion and stenosis of bilateral carotid arteries: Secondary | ICD-10-CM | POA: Insufficient documentation

## 2024-02-27 DIAGNOSIS — R42 Dizziness and giddiness: Secondary | ICD-10-CM

## 2024-02-27 DIAGNOSIS — I421 Obstructive hypertrophic cardiomyopathy: Secondary | ICD-10-CM | POA: Insufficient documentation

## 2024-02-27 DIAGNOSIS — E669 Obesity, unspecified: Secondary | ICD-10-CM | POA: Diagnosis not present

## 2024-02-27 NOTE — Patient Instructions (Signed)
 Medication Instructions:   Continue all current medications.   Labwork:  none  Testing/Procedures:  Your physician has recommended that you wear a 14 day live event monitor. Event monitors are medical devices that record the heart's electrical activity. Doctors most often [place] these monitors to diagnose arrhythmias. Arrhythmias are problems with the speed or rhythm of the heartbeat. The monitor is a small, portable device. You can wear one while you do your normal daily activities. This is usually used to diagnose what is causing palpitations/syncope (passing out). Office will contact with results via phone, letter or mychart.     Follow-Up:  6-8 weeks   Any Other Special Instructions Will Be Listed Below (If Applicable).   If you need a refill on your cardiac medications before your next appointment, please call your pharmacy.

## 2024-02-27 NOTE — Progress Notes (Signed)
 Cardiology Office Note:  .   Date:  02/27/2024 ID:  Erin Sexton, DOB 1971/10/04, MRN [id number] PCP: Anabel Halon, MD  [facility] Providers Cardiologist:  Marjo Bicker, MD    History of Present Illness: Erin Sexton is a 53 y.o. 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   Last seen by Dr. Bjorn Pippin on October 26, 2021.  Reported chronic shortness of breath that she attributed to asthma as well as occasional lightheadedness when standing, denied any syncope.  Reported rare palpitations, lasting very briefly and only a few seconds.  Was walking every day at that time.  Echocardiogram was updated and revealed findings consistent with hokum, SAM with Valsalva peak and LVOT that appeared to be around 3 m/sec but images were difficult due to body habitus, EF 60 to 65% -see full report below.  Dr. Gaynelle Arabian recommended cardiac MRI.  Appears that this has not been obtained.   11/12/2023 - Today she presents for follow-up. She has lost 50 lbs since last office visit. Says she is doing very well and feels the best she has after her significant weight loss. Denies any chest pain, but admits to chest tightness at times, says it is hard to breathe during these episodes, denies any triggers. Alleviating factors include taking deep breaths and says taking metoprolol helps, says she can't remember the last time she had an episode, rare and has improved with her weight loss. Denies any palpitations, syncope, presyncope, dizziness, orthopnea, PND, swelling or significant weight changes, acute bleeding, or claudication. Says she had a TIA many years ago and wants to know if she needs another carotid duplex.  Last seen by Dr. Jenene Slicker on December 24, 2023. Was overall doing well, however noticed some ocular migraines and dizziness along with  this. Dr. Jenene Slicker recommended patient needed to undergo genetic testing once cMRI had resulted.   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 See full report below.   Today she presents for follow-up based on these findings.  Tells me since I last saw her, she had a bad allergic reaction to Prevnar vaccine, had cellulitis and is currently on antibiotics for this, has helped clear up cellulitis.  Says she pulled a tick off herself yesterday, says she is concerned about this. Denies any chest pain, shortness of breath, palpitations, syncope, presyncope, orthopnea, PND, swelling or significant weight changes, acute bleeding, or claudication.  Does admit to some spells of dizziness, has seen more frequent recently, has some episodes where she feels like she has double vision, says 1 episode happened while driving, typically last 5 to 10 minutes.  Denies any syncopal episodes.  ROS: Negative. See HPI.  FH: Father - CABG x 4, Mother - WPW. Mom's sister - PPM.   Studies Reviewed: [name]        EKG: EKG is not ordered today.   cMRI 02/2024:  IMPRESSION: 1. Asymmetric LV hypertrophy measuring 16mm in septum (10mm in posterior wall), meeting criteria for hypertrophic cardiomyopathy. 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 Patchy LGE at basal inferolateral wall and RV insertion site, consistent with HCM. LGE accounts for 8% of total myocardial mass   3.  Normal LV size and systolic function (EF 54%)   4.  Small RV size with normal systolic function (EF 51%)   5. Moderate to severe mitral  regurgitation; moderate by regurgitant volume (36cc), severe by regurgitant fraction (47%)  Echo 11/2021: 1. Findings consistent with HOCM. SAM. with valsalva peak velocity in  LVOT appears to be around 61m/sec but images difficult due to body habitus.   2. Left ventricular ejection fraction, by estimation, is 60 to 65%. The  left ventricle has normal function. The left ventricle has no regional  wall motion abnormalities. There is severe asymmetric left ventricular  hypertrophy of the basal and septal  segments. Left ventricular diastolic parameters are consistent with Grade  I diastolic dysfunction (impaired relaxation).   3. Right ventricular systolic function is normal. The right ventricular  size is normal. There is normal pulmonary artery systolic pressure.   4. The mitral valve is abnormal. Mild mitral valve regurgitation. No  evidence of mitral stenosis. Moderate mitral annular calcification.   5. The aortic valve is tricuspid. There is mild calcification of the  aortic valve. Aortic valve regurgitation is not visualized. Aortic valve  sclerosis is present, with no evidence of aortic valve stenosis.   6. The inferior vena cava is normal in size with greater than 50%  respiratory variability, suggesting right atrial pressure of 3 mmHg.  Carotid duplex 06/2016: IMPRESSION: Less than 50% stenosis in the right and left internal carotid arteries.   Physical Exam:   VS:  BP 130/86   Pulse 78   Ht 5\' 9"  (1.753 m)   Wt 243 lb 3.2 oz (110.3 kg)   SpO2 98%   BMI 35.91 kg/m    Wt Readings from Last 3 Encounters:  02/27/24 243 lb 3.2 oz (110.3 kg)  02/16/24 244 lb 9.6 oz (110.9 kg)  12/24/23 248 lb (112.5 kg)    GEN: Obese, 53 y.o. female in no acute distress NECK: No JVD; No carotid bruits CARDIAC: S1/S2, RRR, no murmurs, rubs, gallops RESPIRATORY:  Clear to auscultation without rales, wheezing or rhonchi  ABDOMEN: Soft, non-tender, non-distended EXTREMITIES:  No edema; No  deformity, bug bite along left hand, no bullseye noticed.   ASSESSMENT AND PLAN: .    HOCM TTE in 2022 revealed findings consistent with HOCM, Dr. Bjorn Pippin recommended proceeding with cMRI for further evaluation. Recent cMRI confirmed findings of HOCM with LVOT obstruction, was not quantified. Normal LVEF. Recent Holter monitor negative for any malignant arrhythmias. Discussed genetic testing for relatives, she verbalized understanding. Will consult attending cardiologist regarding this. Care and ED precautions discussed.  HTN BP stable. Discussed to monitor BP at home at least 2 hours after medications and sitting for 5-10 minutes. No medication changes at this time. Heart healthy diet and regular cardiovascular exercise encouraged.   Hx of TIA, carotid artery stenosis Remote history many years ago.  Most recent carotid duplex revealed bilateral ICA stenosis at 1 to 39%.  Current symptoms do not sound related to this.  See below.  No medication changes at this time.  Continue to follow with PCP.  Obesity Weight loss via diet and exercise encouraged. Discussed the impact being overweight would have on cardiovascular risk. Continue Mounjaro. Congratulated her on her significant weight loss.   5. Dizziness, double vision Etiology  unclear. Denies any syncope but admits to more recent symptoms. Will arrange 2 week Live monitor to r/o anything acute.  No medication changes at this time.  Discussed conservative measures.  Care and ED precautions discussed.  She verbalized understanding.   Dispo: Follow-up with MD/APP in 6-8 weeks or sooner if anything changes.   Signed, Sharlene Dory, NP

## 2024-03-06 DIAGNOSIS — R42 Dizziness and giddiness: Secondary | ICD-10-CM | POA: Diagnosis not present

## 2024-03-09 ENCOUNTER — Other Ambulatory Visit: Payer: Self-pay | Admitting: Internal Medicine

## 2024-03-09 DIAGNOSIS — E119 Type 2 diabetes mellitus without complications: Secondary | ICD-10-CM

## 2024-03-17 ENCOUNTER — Telehealth: Payer: Self-pay | Admitting: Internal Medicine

## 2024-03-17 ENCOUNTER — Encounter: Payer: Self-pay | Admitting: Plastic Surgery

## 2024-03-17 ENCOUNTER — Ambulatory Visit: Admitting: Plastic Surgery

## 2024-03-17 ENCOUNTER — Telehealth: Payer: Self-pay

## 2024-03-17 VITALS — BP 150/90 | HR 74 | Ht 69.0 in | Wt 243.0 lb

## 2024-03-17 DIAGNOSIS — R21 Rash and other nonspecific skin eruption: Secondary | ICD-10-CM | POA: Diagnosis not present

## 2024-03-17 DIAGNOSIS — K439 Ventral hernia without obstruction or gangrene: Secondary | ICD-10-CM

## 2024-03-17 DIAGNOSIS — E65 Localized adiposity: Secondary | ICD-10-CM | POA: Diagnosis not present

## 2024-03-17 DIAGNOSIS — M793 Panniculitis, unspecified: Secondary | ICD-10-CM | POA: Diagnosis not present

## 2024-03-17 NOTE — Telephone Encounter (Signed)
   Name: Erin Sexton  DOB: Aug 21, 1971  MRN: [id number]  Primary Cardiologist: Marjo Bicker, MD  Chart reviewed as part of pre-operative protocol coverage. The patient has an upcoming visit scheduled with Sharlene Dory, NP on 04/23/2024 at which time clearance can be addressed in case there are any issues that would impact surgical recommendations.  Panniculectomy is not scheduled until TBD as below. I added preop FYI to appointment note so that provider is aware to address at time of outpatient visit.  Per office protocol the cardiology provider should forward their finalized clearance decision and recommendations regarding antiplatelet therapy to the requesting party below.    She is not on any anticoagulant or antiplatelet.  London Pepper and Mounjaro are prescribed by PCP  I will route this message as Lorain Childes to requesting party and remove this message from the preop box as separate preop APP input not needed at this time.   Please call with any questions.  Denyce Robert, NP  03/17/2024, 4:09 PM

## 2024-03-17 NOTE — Telephone Encounter (Signed)
   Pre-operative Risk Assessment    Patient Name: Erin Sexton  DOB: 06-Oct-1971 MRN: [id number]      Request for Surgical Clearance    Procedure:   Panniculectomy  Date of Surgery:  Clearance TBD                                 Surgeon:  Dr. Weyman Croon Surgeon's Group or Practice Name:  [facility] Phone number:  [phone] Fax number:  [phone]   Type of Clearance Requested:   - Medical  - Pharmacy:  Hold if applicable      Type of Anesthesia:  General    Additional requests/questions:    SignedSeymour Bars   03/17/2024, 4:00 PM

## 2024-03-17 NOTE — Progress Notes (Signed)
 Referring Provider Anabel Halon, MD [address] [place],  [place] [address]   CC:  Chief Complaint  Patient presents with   Advice Only   Skin Problem      Erin Sexton is an 53 y.o. female.  HPI: Erin Sexton is a 53 year old female who presents today for evaluation of excess skin on the anterior abdominal wall of greater than 1 year duration.  Patient states that she has lost in excess of 150 pounds through diet exercise and medical weight management.  She now has a pannus which extends down onto her thighs.  She states that she frequently has rashes which are quite painful and often bleed especially in the summer when she sweats excessively.  She is treated these rashes in the past with Lotrisone with limited improvement and she always has recurrence of the rashes when she stops using the cream.  Of note she also has a small ventral hernia just superior to her umbilicus.  She is interested in having this addressed as well.  Patient does have a history of cardiac issues which are being evaluated by her cardiologist.  She will need to have cardiac clearance prior to any surgery.  No Known Allergies  Outpatient Encounter Medications as of 03/17/2024  Medication Sig Note   ACCU-CHEK GUIDE TEST test strip USE 1 STRIP TO CHECK GLUCOSE ONCE DAILY    Accu-Chek Softclix Lancets lancets USE 1 LANCET TO CHECK GLUCOSE ONCE DAILY    albuterol (PROVENTIL) (5 MG/ML) 0.5% nebulizer solution Take 0.5 mLs (2.5 mg total) by nebulization every 6 (six) hours as needed for wheezing or shortness of breath. 08/27/2021: PRN   albuterol (VENTOLIN HFA) 108 (90 Base) MCG/ACT inhaler Inhale 2 puffs into the lungs as needed (wheezing).  08/08/2021: Uses daily   atorvastatin (LIPITOR) 80 MG tablet Take 1 tablet (80 mg total) by mouth daily.    blood glucose meter kit and supplies KIT Dispense based on patient and insurance preference. Use once a day. Diagnosis code:E11.9    buPROPion (WELLBUTRIN XL) 300 MG  24 hr tablet Take 1 tablet (300 mg total) by mouth daily.    cephALEXin (KEFLEX) 500 MG capsule Take 1 capsule (500 mg total) by mouth 3 (three) times daily.    Cholecalciferol (VITAMIN D3 PO) Take 10,000 Units by mouth daily.    clindamycin (CLEOCIN T) 1 % lotion  08/27/2021: PRN   clotrimazole-betamethasone (LOTRISONE) cream APPLY  CREAM TOPICALLY TO AFFECTED AREA TWICE DAILY    empagliflozin (JARDIANCE) 10 MG TABS tablet Take 1 tablet (10 mg total) by mouth daily.    estradiol (ESTRACE) 2 MG tablet TAKE 1 TABLET BY MOUTH AT BEDTIME    levocetirizine (XYZAL) 5 MG tablet Take 1 tablet (5 mg total) by mouth every evening.    levonorgestrel (MIRENA) 20 MCG/24HR IUD 1 each by Intrauterine route once.    levothyroxine (SYNTHROID) 112 MCG tablet Take 1 tablet (112 mcg total) by mouth daily.    lisinopril (ZESTRIL) 2.5 MG tablet Take 1 tablet (2.5 mg total) by mouth daily.    LORazepam (ATIVAN) 0.5 MG tablet Take 1 tablet 30 minutes prior to procedure. Please take additional tablet for persistent anxiety.    megestrol (MEGACE) 40 MG tablet Take 1 tablet (40 mg total) by mouth daily.    metoprolol tartrate (LOPRESSOR) 50 MG tablet Take 1 tablet by mouth twice daily    nystatin (MYCOSTATIN/NYSTOP) powder Apply 1 Application topically 3 (three) times daily.    Rimegepant  Sulfate (NURTEC) 75 MG TBDP Take 1 tablet (75 mg total) by mouth as needed.    SYMBICORT 160-4.5 MCG/ACT inhaler Inhale 2 puffs into the lungs as needed.    tirzepatide (MOUNJARO) 15 MG/0.5ML Pen Inject 15 mg into the skin once a week.    topiramate (TOPAMAX) 100 MG tablet Take 1 tablet (100 mg total) by mouth daily.    No facility-administered encounter medications on file as of 03/17/2024.     Past Medical History:  Diagnosis Date   Abnormal CT scan    a. 02/2014: evaluated for dyspnea and initially placed on Xarelto for PE because radiologist could not exclude small peripheral pulmonary emboli because of limited contrast. However,  after admission for ++vaginal bleeding several days later, it was felt she did NOT have PE - underwent further testing with neg VQ 03/06/14, neg LE duplex.   Anemia    Anxiety    Arthritis    Asthma    Blood transfusion without reported diagnosis    Bone spur of right foot    CHF (congestive heart failure) (HCC)    COPD (chronic obstructive pulmonary disease) (HCC)    Depression    Diabetes mellitus without complication (HCC)    a. Dx 02/2014.   Heart murmur    History of echocardiogram 2015   normal EF, Grade II diastolic dysfunction   HOCM (hypertrophic obstructive cardiomyopathy) (HCC)    a. Dx 86578.   Hyperlipidemia    Hypertension    Morbid obesity (HCC)    NSVT (nonsustained ventricular tachycardia) (HCC)    a. Noted on tele during 02/2014 adm for symptomatic anemia.   Stroke [facility])    SVT (supraventricular tachycardia) (HCC)    a. Noted on tele during 02/2014 adm for symptomatic anemia.   Thyroid disease    Vaginal bleeding     Past Surgical History:  Procedure Laterality Date   COLONOSCOPY N/A 09/30/2019   Procedure: COLONOSCOPY;  Surgeon: Malissa Hippo, MD;  Location: [facility]ITE;  Service: Endoscopy;  Laterality: N/A;  730   DILITATION & CURRETTAGE/HYSTROSCOPY WITH NOVASURE ABLATION N/A 08/07/2016   Procedure: DILATATION & CURETTAGE/HYSTEROSCOPY WITH ATTPEMPTED NOVASURE ABLATION AND INSERTION OF IUD;  Surgeon: Lazaro Arms, MD;  Location: [facility] ORS;  Service: Gynecology;  Laterality: N/A;    Family History  Problem Relation Age of Onset   Diabetes Mother    Arthritis Mother    Other Mother        WPW   Mental illness Mother        Depression   Asthma Mother    Obesity Mother    Heart disease Father        History of coronary bypass grafting at age 42   Hypertension Father    Mental illness Sister    Bipolar disorder Sister    Heart disease Sister        possibly WPW   Depression Sister    Varicose Veins Sister    Uterine cancer Maternal Grandmother     Mental illness Maternal Grandmother    Arthritis Maternal Grandmother    Hyperlipidemia Maternal Grandmother    Heart disease Maternal Grandfather    CAD Neg Hx        No family history of EARLY CAD    Social History   Social History Narrative   Divorced for 3 years,married for 5 years.On disability since 2009 secondary to mental illness.     Review of Systems General: Denies fevers, chills, weight  loss CV: Denies chest pain, shortness of breath, palpitations Abdomen: Excess skin of fat on the anterior abdominal wall which frequently has rashes and pain on the posterior aspect of the pannus and in the intertriginous regions.  Additionally she complains that the pannus interferes with her daily activities and her ability to properly wear close.  Physical Exam    03/17/2024    1:40 PM 02/27/2024    3:22 PM 02/16/2024   10:04 AM  Vitals with BMI  Height 5\' 9"  5\' 9"  5\' 9"   Weight 243 lbs 243 lbs 3 oz 244 lbs 10 oz  BMI 35.87 35.9 36.1  Systolic 150 130 454  Diastolic 90 86 75  Pulse 74 78 78    General:  No acute distress,  Alert and oriented, Non-Toxic, Normal speech and affect Abdomen: Patient has a very large heavy pannus which extends onto the mid thighs.  There is significant erythema on the posterior aspect of the pannus as well as scarring and hyperpigmentation consistent with previous infections.  There is a palpable fascial defect with herniated contents superior to the umbilicus. 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  I had a long discussion with her regarding the procedure.  I showed her the location of the incisions and we discussed the unpredictable nature of scarring and wound healing.  We discussed the risk of bleeding, infection, and seroma formation.  She understands that she will have drains for 1 to 4 weeks  and will have a compressive garment for 6 weeks.  We did discuss the fact that because of the size of her pannus and the ongoing infections on the posterior aspect of the pannus that she is at a very high risk for infection and that she is in general at a high risk for wound healing complications.  She understands all of this and requests that we proceed with a panniculectomy.  Photographs were obtained today with her consent.  All questions were answered to her satisfaction.  Will submit her for panniculectomy at her request.  Need cardiac clearance prior to proceeding with the resection of her pannus.  Will place a consult to general surgery for evaluation of her hernia.  The hernia is superior to the planned resection however it is not unreasonable to consider repairing the hernia at this time of the panniculectomy.  Santiago Glad 03/17/2024, 2:31 PM

## 2024-03-17 NOTE — Telephone Encounter (Signed)
 Faxed the Request for Surgical Clearance form to Dr. Jenene Slicker (cardiology) at: [phone].  Confirmation of receipt received.

## 2024-03-29 ENCOUNTER — Other Ambulatory Visit: Payer: Self-pay | Admitting: Nurse Practitioner

## 2024-03-29 ENCOUNTER — Telehealth: Payer: Self-pay | Admitting: Nurse Practitioner

## 2024-03-29 ENCOUNTER — Telehealth: Payer: Self-pay

## 2024-03-29 DIAGNOSIS — I421 Obstructive hypertrophic cardiomyopathy: Secondary | ICD-10-CM

## 2024-03-29 DIAGNOSIS — I5032 Chronic diastolic (congestive) heart failure: Secondary | ICD-10-CM

## 2024-03-29 MED ORDER — VERAPAMIL HCL ER 120 MG PO TBCR: 120.0000 mg | EXTENDED_RELEASE_TABLET | Freq: Every day | ORAL | 1 refills | Status: DC

## 2024-03-29 NOTE — Telephone Encounter (Signed)
-----   Message from Lasalle Pointer sent at 03/27/2024  8:16 PM EDT ----- I have consulted Dr. Mallipeddi regarding this patient.   She recommended the following:  1. Stop lisinopril .  2. Start Verapamil  120 mg once daily. I recommend bringing her back in 1 week to repeat vitals and EKG after medication change has been made. Continue to log and monitor her BP.  3. Obtain genetic testing for HOCM (it is a panel we send out to, [facility] knows how to place orders).  4. Need Limited Echo to obtain LVOT gradient at rest and with provocative maneuvers.  Diagnosis is for HOCM.   Thanks!   Best, Lasalle Pointer, NP ----- Message ----- From: Lasalle Pointer, MD Sent: 03/26/2024   8:43 PM EDT To: Lasalle Pointer, NP; Camilo Cella, CMA  Stop lisinopril . Start Verapamil  120 mg once daily. Need to uptitrate Verapamil  based on response in the next clinic visit. Max dose 480 mg once daily. Continue Metoprolol . Obtain genetic testing for HOCM (it is a panel we send out to, [facility] knows how to place orders). Need Limited Echo to obtain LVOT gradient at rest and with provocative maneuvers. If this is inconclusive, she needs stress echo for LVOT gradients. Need more info on dizziness. 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  Dr Paulita Boss manages HCM clinic. I refer only when I exhausted my options or if patient requests to. ----- Message ----- From: Lasalle Pointer, NP Sent: 03/01/2024   4:52 PM EDT To: Vishnu P Mallipeddi, MD  Hello Dr. Mallipeddi,   Wanted to get your advice about this patient for genetic testing that I want to get her set up with. I have heard Dr. Paulita Boss is one that can assist with this, but wanted to confirm with you.   Also with her LVOT obstruction and worsening dizziness, arranging further workup. Wanted to see if you had any other suggestions or recommendations.   Thank you so much!   Kind Regards, Lasalle Pointer, NP

## 2024-03-29 NOTE — Telephone Encounter (Signed)
 Spoke with patient via mychart unable to reach by phone patient is aware of medication changes and Lab order is ready for patient to pick up during nurse visit next week. Limited echo has been ordered as well.   Sending to scheduling to call or message patient to schedule for Nurse visit next week for vitals and EKG per peck  And limited Echo [place] or [place]

## 2024-03-29 NOTE — Telephone Encounter (Signed)
-----   Message from Vishnu P Mallipeddi sent at 03/26/2024  8:32 PM EDT ----- Stop lisinopril . Start Verapamil  120 mg once daily. Need to uptitrate Verapamil  based on response in the next clinic visit. Max dose 480 mg once daily. Continue Metoprolol . Obtain genetic testing for HOCM (it is a panel we send out to, [facility] Sanna knows how to place orders). Need Limited Echo to obtain LVOT gradient at rest and with provocative maneuvers. If this is inconclusive, she needs stress echo for LVOT gradients. Need more info on dizziness. 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  Dr Paulita Boss manages HCM clinic. I refer only when I exhausted my options or if patient requests to. ----- Message ----- From: Lasalle Pointer, NP Sent: 03/01/2024   4:52 PM EDT To: Vishnu P Mallipeddi, MD  Hello Dr. Mallipeddi,   Wanted to get your advice about this patient for genetic testing that I want to get her set up with. I have heard Dr. Paulita Boss is one that can assist with this, but wanted to confirm with you.   Also with her LVOT obstruction and worsening dizziness, arranging further workup. Wanted to see if you had any other suggestions or recommendations.   Thank you so much!   Kind Regards, Lasalle Pointer, NP

## 2024-03-29 NOTE — Telephone Encounter (Signed)
 Left message for patient to call back

## 2024-03-29 NOTE — Telephone Encounter (Signed)
 Called patient twice unable to reach by phone. MyChart message sent to patient.

## 2024-03-30 ENCOUNTER — Ambulatory Visit: Admitting: Nurse Practitioner

## 2024-03-30 ENCOUNTER — Ambulatory Visit: Attending: Internal Medicine

## 2024-03-30 DIAGNOSIS — I5032 Chronic diastolic (congestive) heart failure: Secondary | ICD-10-CM | POA: Diagnosis not present

## 2024-03-30 DIAGNOSIS — I421 Obstructive hypertrophic cardiomyopathy: Secondary | ICD-10-CM | POA: Diagnosis not present

## 2024-03-30 MED ORDER — PERFLUTREN LIPID MICROSPHERE
1.0000 mL | INTRAVENOUS | Status: AC | PRN
  Administered 2024-03-30: 6 mL via INTRAVENOUS

## 2024-03-31 LAB — ECHOCARDIOGRAM LIMITED
AR max vel: 3.82 cm2
AV Peak grad: 20.1 mmHg
Ao pk vel: 2.24 m/s
Area-P 1/2: 2.5 cm2
Calc EF: 81.3 %
Est EF: >75
S' Lateral: 1.3 cm
Single Plane A2C EF: 77.6 %
Single Plane A4C EF: 84.5 %

## 2024-04-01 DIAGNOSIS — E66812 Obesity, class 2: Secondary | ICD-10-CM | POA: Diagnosis not present

## 2024-04-01 DIAGNOSIS — K429 Umbilical hernia without obstruction or gangrene: Secondary | ICD-10-CM | POA: Diagnosis not present

## 2024-04-01 NOTE — Telephone Encounter (Signed)
 New fax send in from [facility] on behalf of Harman Lightning, MD. This pt will be having a combined case surgery. A Ventral Hernia under general anesthesia has been added.   Phone: [phone], leave message for triage nurse Fax: [phone] attn Ami Balboa, CMA

## 2024-04-02 ENCOUNTER — Encounter: Payer: Self-pay | Admitting: Plastic Surgery

## 2024-04-04 ENCOUNTER — Other Ambulatory Visit: Payer: Self-pay | Admitting: Internal Medicine

## 2024-04-04 DIAGNOSIS — I10 Essential (primary) hypertension: Secondary | ICD-10-CM

## 2024-04-05 ENCOUNTER — Ambulatory Visit

## 2024-04-08 ENCOUNTER — Other Ambulatory Visit (HOSPITAL_COMMUNITY)
Admission: RE | Admit: 2024-04-08 | Discharge: 2024-04-08 | Disposition: A | Discharge status: Home / Self Care | Source: Ambulatory Visit | Attending: Internal Medicine | Admitting: Internal Medicine

## 2024-04-08 ENCOUNTER — Ambulatory Visit

## 2024-04-08 VITALS — BP 110/60 | HR 77

## 2024-04-08 DIAGNOSIS — I10 Essential (primary) hypertension: Secondary | ICD-10-CM | POA: Diagnosis not present

## 2024-04-08 DIAGNOSIS — I421 Obstructive hypertrophic cardiomyopathy: Secondary | ICD-10-CM | POA: Insufficient documentation

## 2024-04-08 NOTE — Progress Notes (Signed)
 Patient here for Nurse visit for EKG per [facility] for vitals and EKG  Patient going to have lab order done today or tomorrow provided her with this information during this visit. For genetic testing Patient states she is feeling good no complaints  EKG preformed  Vitals  SPO2-99 HR-77 BP-110/60

## 2024-04-16 LAB — MISC LABCORP TEST (SEND OUT): Labcorp test code: 482207

## 2024-04-20 ENCOUNTER — Encounter: Payer: Self-pay | Admitting: Internal Medicine

## 2024-04-20 ENCOUNTER — Telehealth (INDEPENDENT_AMBULATORY_CARE_PROVIDER_SITE_OTHER): Admitting: Internal Medicine

## 2024-04-20 DIAGNOSIS — L03818 Cellulitis of other sites: Secondary | ICD-10-CM

## 2024-04-20 DIAGNOSIS — I421 Obstructive hypertrophic cardiomyopathy: Secondary | ICD-10-CM

## 2024-04-20 DIAGNOSIS — T63301A Toxic effect of unspecified spider venom, accidental (unintentional), initial encounter: Secondary | ICD-10-CM | POA: Diagnosis not present

## 2024-04-20 DIAGNOSIS — I10 Essential (primary) hypertension: Secondary | ICD-10-CM

## 2024-04-20 MED ORDER — DOXYCYCLINE HYCLATE 100 MG PO TABS: 100.0000 mg | ORAL_TABLET | Freq: Two times a day (BID) | ORAL | 0 refills | Status: DC

## 2024-04-20 NOTE — Progress Notes (Addendum)
 Virtual Visit via Video Note   Because of Erin Sexton's co-morbid illnesses, she is at least at moderate risk for complications without adequate follow up.  This format is felt to be most appropriate for this patient at this time.  All issues noted in this document were discussed and addressed.  A limited physical exam was performed with this format.      Evaluation Performed:  Follow-up visit  Date:  04/20/2024   ID:  Erin Sexton, DOB [birth year], MRN [id number]  Patient Location: Home Provider Location: Office/Clinic  Participants: Patient Location of Patient: Home Location of Provider: Telehealth Consent was obtain for visit to be over via telehealth. I verified that I am speaking with the correct person using two identifiers.  PCP:  Meldon Sport, MD   Chief Complaint: Spider bite  History of Present Illness:    Erin Sexton is a 53 y.o. female who has a video visit for c/o spider bite underneath left breast area. She had noticed a nickel sized bump initially, which was painful. She noticed rupture of the pustule yesterday and had foul-smelling discharge. She does not have fever or chills, has felt mild fatigue.  She also reports leg swelling since starting Verapamil  instead if lisinopril , which was started by Cardiology after checking recent cardiac monitor. She has h/o HCOM. She denies any chest pain or worsening of dyspnea.  The patient does not have symptoms concerning for COVID-19 infection (fever, chills, cough, or new shortness of breath).   Past Medical, Surgical, Social History, Allergies, and Medications have been Reviewed.  Past Medical History:  Diagnosis Date   Abnormal CT scan    a. 02/2014: evaluated for dyspnea and initially placed on Xarelto  for PE because radiologist could not exclude small peripheral pulmonary emboli because of limited contrast. However, after admission for ++vaginal bleeding several days later, it was felt she did NOT  have PE - underwent further testing with neg VQ 03/06/14, neg LE duplex.   Anemia    Anxiety    Arthritis    Asthma    Blood transfusion without reported diagnosis    Bone spur of right foot    CHF (congestive heart failure) (HCC)    COPD (chronic obstructive pulmonary disease) (HCC)    Depression    Diabetes mellitus without complication (HCC)    a. Dx 02/2014.   Heart murmur    History of echocardiogram 2015   normal EF, Grade II diastolic dysfunction   HOCM (hypertrophic obstructive cardiomyopathy) (HCC)    a. Dx 40981.   Hyperlipidemia    Hypertension    Morbid obesity (HCC)    NSVT (nonsustained ventricular tachycardia) (HCC)    a. Noted on tele during 02/2014 adm for symptomatic anemia.   Stroke [facility])    SVT (supraventricular tachycardia) (HCC)    a. Noted on tele during 02/2014 adm for symptomatic anemia.   Thyroid  disease    Vaginal bleeding    Past Surgical History:  Procedure Laterality Date   COLONOSCOPY N/A 09/30/2019   Procedure: COLONOSCOPY;  Surgeon: Ruby Corporal, MD;  Location: [facility];  Service: Endoscopy;  Laterality: N/A;  730   DILITATION & CURRETTAGE/HYSTROSCOPY WITH NOVASURE ABLATION N/A 08/07/2016   Procedure: DILATATION & CURETTAGE/HYSTEROSCOPY WITH ATTPEMPTED NOVASURE ABLATION AND INSERTION OF IUD;  Surgeon: Wendelyn Halter, MD;  Location: [facility] ORS;  Service: Gynecology;  Laterality: N/A;     Current Meds  Medication Sig   doxycycline  (VIBRA -TABS) 100  MG tablet Take 1 tablet (100 mg total) by mouth 2 (two) times daily.     Allergies:   Patient has no known allergies.   ROS:   Please see the history of present illness. All other systems reviewed and are negative.   Labs/Other Tests and Data Reviewed:    Recent Labs: 10/17/2023: Platelets 212 11/12/2023: Hemoglobin 17.4 02/16/2024: ALT 46; BUN 22; Creatinine, Ser 1.36; Potassium 4.2; Sodium 144; TSH 1.950   Recent Lipid Panel Lab Results  Component Value Date/Time   CHOL 95 (L)  06/16/2023 11:21 AM   TRIG 78 06/16/2023 11:21 AM   HDL 34 (L) 06/16/2023 11:21 AM   CHOLHDL 2.8 06/16/2023 11:21 AM   CHOLHDL 2.7 06/27/2020 01:55 PM   LDLCALC 45 06/16/2023 11:21 AM   LDLCALC 21 06/27/2020 01:55 PM    Wt Readings from Last 3 Encounters:  03/17/24 243 lb (110.2 kg)  02/27/24 243 lb 3.2 oz (110.3 kg)  02/16/24 244 lb 9.6 oz (110.9 kg)     Objective:    Vital Signs:  There were no vitals taken for this visit.   VITAL SIGNS:  reviewed GEN:  no acute distress EYES:  sclerae anicteric, EOMI - Extraocular Movements Intact RESPIRATORY:  normal respiratory effort, symmetric expansion NEURO:  alert and oriented x 3, no obvious focal deficit PSYCH:  normal affect  ASSESSMENT & PLAN:    Cellulitis Spider bite, initial encounter, underneath the left breast Started empiric doxycycline  Advised to keep area clean and dry  Essential hypertension, benign BP Readings from Last 1 Encounters:  04/08/24 110/60   Well-controlled with Metoprolol  and Verapamil  Lisinopril  was recently discontinued by cardiology Counseled for compliance with the medications Advised DASH diet and moderate exercise/walking, at least 150 mins/week  HOCM (hypertrophic obstructive cardiomyopathy) (HCC) Noted on recent echo Followed by cardiology On Jardiance  and torsemide  for HFpEF On metoprolol  and verapamil     I discussed the assessment and treatment plan with the patient. The patient was provided an opportunity to ask questions, and all were answered. The patient agreed with the plan and demonstrated an understanding of the instructions.   The patient was advised to call back or seek an in-person evaluation if the symptoms worsen or if the condition fails to improve as anticipated.  The above assessment and management plan was discussed with the patient. The patient verbalized understanding of and has agreed to the management plan.   Medication Adjustments/Labs and Tests  Ordered: Current medicines are reviewed at length with the patient today.  Concerns regarding medicines are outlined above.   Tests Ordered: No orders of the defined types were placed in this encounter.   Medication Changes: Meds ordered this encounter  Medications   doxycycline  (VIBRA -TABS) 100 MG tablet    Sig: Take 1 tablet (100 mg total) by mouth 2 (two) times daily.    Dispense:  10 tablet    Refill:  0     Note: This dictation was prepared with Dragon dictation along with smaller phrase technology. Similar sounding words can be transcribed inadequately or may not be corrected upon review. Any transcriptional errors that result from this process are unintentional.      Disposition:  Follow up  Signed, Meldon Sport, MD  04/20/2024 9:51 AM     [facility]

## 2024-04-20 NOTE — Assessment & Plan Note (Signed)
 BP Readings from Last 1 Encounters:  04/08/24 110/60   Well-controlled with Metoprolol  and Verapamil  Lisinopril  was recently discontinued by cardiology Counseled for compliance with the medications Advised DASH diet and moderate exercise/walking, at least 150 mins/week

## 2024-04-20 NOTE — Addendum Note (Signed)
 Addended byCleola Dach on: 04/20/2024 09:51 AM   Modules accepted: Orders

## 2024-04-20 NOTE — Assessment & Plan Note (Signed)
 Noted on recent echo Followed by cardiology On Jardiance  and torsemide  for HFpEF On metoprolol  and verapamil 

## 2024-04-20 NOTE — Patient Instructions (Signed)
 Please start taking doxycycline  as prescribed for spider bite/cellulitis.  Please keep area clean and dry.

## 2024-04-21 ENCOUNTER — Ambulatory Visit: Payer: Self-pay | Admitting: Internal Medicine

## 2024-04-21 ENCOUNTER — Ambulatory Visit: Admitting: Internal Medicine

## 2024-04-23 ENCOUNTER — Ambulatory Visit: Attending: Nurse Practitioner | Admitting: Nurse Practitioner

## 2024-04-23 ENCOUNTER — Other Ambulatory Visit: Payer: Self-pay | Admitting: Internal Medicine

## 2024-04-23 ENCOUNTER — Encounter: Payer: Self-pay | Admitting: Internal Medicine

## 2024-04-23 ENCOUNTER — Encounter: Payer: Self-pay | Admitting: Nurse Practitioner

## 2024-04-23 VITALS — BP 122/74 | HR 70 | Ht 69.0 in | Wt 243.0 lb

## 2024-04-23 DIAGNOSIS — I10 Essential (primary) hypertension: Secondary | ICD-10-CM | POA: Insufficient documentation

## 2024-04-23 DIAGNOSIS — Z8673 Personal history of transient ischemic attack (TIA), and cerebral infarction without residual deficits: Secondary | ICD-10-CM | POA: Insufficient documentation

## 2024-04-23 DIAGNOSIS — Z01810 Encounter for preprocedural cardiovascular examination: Secondary | ICD-10-CM | POA: Insufficient documentation

## 2024-04-23 DIAGNOSIS — E669 Obesity, unspecified: Secondary | ICD-10-CM | POA: Insufficient documentation

## 2024-04-23 DIAGNOSIS — L304 Erythema intertrigo: Secondary | ICD-10-CM

## 2024-04-23 DIAGNOSIS — I421 Obstructive hypertrophic cardiomyopathy: Secondary | ICD-10-CM | POA: Diagnosis not present

## 2024-04-23 DIAGNOSIS — I6523 Occlusion and stenosis of bilateral carotid arteries: Secondary | ICD-10-CM | POA: Insufficient documentation

## 2024-04-23 MED ORDER — CLOTRIMAZOLE-BETAMETHASONE 1-0.05 % EX CREA: TOPICAL_CREAM | CUTANEOUS | 2 refills | Status: AC

## 2024-04-23 MED ORDER — VERAPAMIL HCL ER 120 MG PO TBCR: 120.0000 mg | EXTENDED_RELEASE_TABLET | Freq: Every day | ORAL | 1 refills | Status: DC

## 2024-04-23 NOTE — Patient Instructions (Addendum)

## 2024-04-23 NOTE — Telephone Encounter (Signed)
 Patient states she seems them in October

## 2024-04-23 NOTE — Progress Notes (Addendum)
 Cardiology Office Note:  .   Date:  04/23/2024 ID:  Erin Sexton, DOB Oct 04, 1971, MRN [id number] PCP: Erin Sport, MD  Erin Sexton Cardiologist:  Erin Pointer, MD    History of Present Illness: Erin Sexton is a 53 y.o. 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   Last seen by Erin Sexton on October 26, 2021.  Reported chronic shortness of breath that she attributed to asthma as well as occasional lightheadedness when standing, denied any syncope.  Reported rare palpitations, lasting very briefly and only a few seconds.  Was walking every day at that time.  Echocardiogram was updated and revealed findings consistent with hokum, SAM with Valsalva peak and LVOT that appeared to be around 3 m/sec but images were difficult due to body habitus, EF 60 to 65% -see full report below.  Erin Sexton recommended cardiac MRI.  Appears that this has not been obtained.   11/12/2023 - Today she presents for follow-up. She has lost 50 lbs since last office visit. Says she is doing very well and feels the best she has after her significant weight loss. Denies any chest pain, but admits to chest tightness at times, says it is hard to breathe during these episodes, denies any triggers. Alleviating factors include taking deep breaths and says taking metoprolol  helps, says she can't remember the last time she had an episode, rare and has improved with her weight loss. Denies any palpitations, syncope, presyncope, dizziness, orthopnea, PND, swelling or significant weight changes, acute bleeding, or claudication. Says she had a TIA many years ago and wants to know if she needs another carotid duplex.  Last seen by Erin Sexton on December 24, 2023. Was overall doing well, however noticed some ocular migraines and dizziness along with  this. Erin Sexton recommended patient needed to undergo genetic testing once cMRI had resulted.   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 See full report below.   Today she presents for follow-up based on these findings.  Tells me since I last saw her, she had a bad allergic reaction to Prevnar vaccine, had cellulitis and is currently on antibiotics for this, has helped clear up cellulitis.  Says she pulled a tick off herself yesterday, says she is concerned about this. Denies any chest pain, shortness of breath, palpitations, syncope, presyncope, orthopnea, PND, swelling or significant weight changes, acute bleeding, or claudication.  Does admit to some spells of dizziness, has seen more frequent recently, has some episodes where she feels like she has double vision, says 1 episode happened while driving, typically last 5 to 10 minutes.  Denies any syncopal episodes.  2 week live monitor arranged 03/2024 - see preliminary report below.   Our office was requested clearance for panniculectomy surgery by Dr. Cleora Sexton with plastic surgery specialists.  Date of surgery is TBD.  This request was placed on March 17, 2024.  It was also noted that patient was to be having a combined case surgery.  Of ventral hernia under general anesthesia had been added to requested procedure.  Limited  Echo performed on 03/31/2024 noted below.   Today she presents for follow-up. Doing well from a cardiac standpoint.  She is scheduled to see a Erin Sexton on September 21, 2024 in [place]. Denies any acute cardiac complaints or issues. Denies any chest pain, shortness of breath, palpitations, syncope,  presyncope, dizziness, orthopnea, PND, swelling or significant weight changes, acute bleeding, or claudication.  ROS: Negative. See HPI.  FH: Father - CABG x 4, Mother - WPW. Mom's sister - PPM.   Studies Reviewed: Erin Sexton        EKG: EKG is not ordered today.   Limited Echo 03/31/2024:   1. Peak LVOT gradient is 23 mm Hg at rest that increased to 77 mm Hg with  Valsalva. Known HCM. No LV thrombus by Definity . Left ventricular ejection  fraction, by estimation, is >75%. The left ventricle has hyperdynamic  function. There is severe  asymmetric left ventricular hypertrophy of the lateral segment. Left  ventricular diastolic parameters are consistent with Grade I diastolic  dysfunction (impaired relaxation).   2. Right ventricular systolic function is normal. The right ventricular  size is normal. Tricuspid regurgitation signal is inadequate for assessing  PA pressure.   3. The inferior vena cava is normal in size with greater than 50%  respiratory variability, suggesting right atrial pressure of 3 mmHg.   Comparison(s): A prior study was performed on 12/05/2021. Changes from  prior study are noted. Peak LVOT gradient is 23 mm Hg at rest and 77 mm Hg  with Valsalva.   Cardiac monitor 03/2024 (preliminary report):  Patch Wear Time:  6 days and 23 hours (2025-03-28T09:21:15-0400 to 2025-04-04T08:58:11-399)   Patient had a min HR of 65 bpm, max HR of 179 bpm, and avg HR of 82 bpm. Predominant underlying rhythm was Sinus Rhythm. Bundle Branch Block/IVCD was present. 1 run of Ventricular Tachycardia occurred lasting 11 beats with a max rate of 146 bpm (avg 136  bpm). 2 Supraventricular Tachycardia runs occurred, the run with the fastest interval lasting 8 beats with a max rate of 179 bpm (avg 144 bpm); the run with the fastest interval was also the longest. Isolated SVEs were rare (<1.0%), SVE Couplets were  rare (<1.0%), and SVE Triplets were rare (<1.0%). Isolated VEs were rare (<1.0%), VE  Couplets were rare (<1.0%), and no VE Triplets were present.  cMRI 02/2024:  IMPRESSION: 1. Asymmetric LV hypertrophy measuring 16mm in septum (10mm in posterior wall), meeting criteria for hypertrophic cardiomyopathy. 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 Patchy LGE at basal inferolateral wall and RV insertion site, consistent with HCM. LGE accounts for 8% of total myocardial mass   3.  Normal LV size and systolic function (EF 54%)   4.  Small RV size with normal systolic function (EF 51%)   5. Moderate to severe mitral regurgitation; moderate by regurgitant volume (36cc), severe by regurgitant fraction (47%)  Echo 11/2021: 1. Findings consistent with HOCM. SAM. with valsalva peak velocity in  LVOT appears to be around 90m/sec but images difficult due to body habitus.   2. Left ventricular ejection fraction, by estimation, is 60 to 65%. The  left ventricle has normal function. The left ventricle has no regional  wall motion abnormalities. There is severe asymmetric left ventricular  hypertrophy of the basal and septal  segments. Left ventricular diastolic parameters are consistent with Grade  I diastolic dysfunction (impaired relaxation).  3. Right ventricular systolic function is normal. The right ventricular  size is normal. There is normal pulmonary artery systolic pressure.   4. The mitral valve is abnormal. Mild mitral valve regurgitation. No  evidence of mitral stenosis. Moderate mitral annular calcification.   5. The aortic valve is tricuspid. There is mild calcification of the  aortic valve. Aortic valve regurgitation is not visualized. Aortic valve  sclerosis is present, with no evidence of aortic valve stenosis.   6. The inferior vena cava is normal in size with greater than 50%  respiratory variability, suggesting right atrial pressure of 3 mmHg.  Carotid  duplex 06/2016: IMPRESSION: Less than 50% stenosis in the right and left internal carotid arteries.   Physical Exam:   VS:  BP 122/74   Pulse 70   Ht 5\' 9"  (1.753 m)   Wt 243 lb (110.2 kg)   SpO2 97%   BMI 35.88 kg/m    Wt Readings from Last 3 Encounters:  04/26/24 244 lb (110.7 kg)  04/23/24 243 lb (110.2 kg)  03/17/24 243 lb (110.2 kg)    GEN: Obese, 53 y.o. female in no acute distress NECK: No JVD; No carotid bruits CARDIAC: S1/S2, RRR, no murmurs, rubs, gallops RESPIRATORY:  Clear to auscultation without rales, wheezing or rhonchi  ABDOMEN: Soft, non-tender, non-distended EXTREMITIES:  No edema; No deformity ASSESSMENT AND PLAN: .    HOCM Most recent cMRI confirmed findings of HOCM with LVOT obstruction, was not quantified. Normal LVEF. Recent Holter monitor negative for any malignant arrhythmias.  Denies any symptoms.  Tolerating medicine well.  See most recent limited echocardiogram noted above.  She will be seeing a Dentist in October of this year. Requesting clearance regarding upcoming surgery - see below. Will consult attending cardiologist to see if she is a candidate for HCM clinic.  Will refill verapamil  per her request.  Continue Jardiance , Lopressor . Care and ED precautions discussed.  HTN BP stable. Discussed to monitor BP at home at least 2 hours after medications and sitting for 5-10 minutes. No medication changes at this time. Heart healthy diet and regular cardiovascular exercise encouraged.   Hx of TIA, carotid artery stenosis Remote history many years ago.  Most recent carotid duplex revealed bilateral ICA stenosis at 1 to 39%.  Denies any symptoms.  No medication changes at this time.  Continue to follow with PCP.  Obesity Weight loss via diet and exercise encouraged. Discussed the impact being overweight would have on cardiovascular risk. Continue Mounjaro . Congratulated her on her significant weight loss.   5.  Preoperative cardiovascular risk  assessment Ms. Helmstetter's perioperative risk of a major cardiac event is 11% according to the Revised Cardiac Risk Index (RCRI).  Therefore, she is at high risk for perioperative complications.   Her functional capacity is excellent at 6.05 METs according to the Duke Activity Status Index (DASI). Recommendations: According to ACC/AHA guidelines, no further cardiovascular testing needed.  The patient may proceed to surgery at acceptable risk.   Antiplatelet and/or Anticoagulation Recommendations: Patient is not on any antiplatelet/anticoagulation medicine that needs to be held prior to procedure. Will consult attending cardiologist regarding clearance.   Dispo: Follow-up with MD/[facility] in 3 months or sooner if anything changes.   Signed, Erin Pointer, NP

## 2024-04-26 ENCOUNTER — Encounter: Payer: Self-pay | Admitting: Internal Medicine

## 2024-04-26 ENCOUNTER — Ambulatory Visit (INDEPENDENT_AMBULATORY_CARE_PROVIDER_SITE_OTHER): Admitting: Internal Medicine

## 2024-04-26 ENCOUNTER — Other Ambulatory Visit: Payer: Self-pay

## 2024-04-26 VITALS — BP 125/80 | HR 78 | Ht 69.0 in | Wt 244.0 lb

## 2024-04-26 DIAGNOSIS — I5032 Chronic diastolic (congestive) heart failure: Secondary | ICD-10-CM

## 2024-04-26 DIAGNOSIS — E1169 Type 2 diabetes mellitus with other specified complication: Secondary | ICD-10-CM

## 2024-04-26 DIAGNOSIS — E559 Vitamin D deficiency, unspecified: Secondary | ICD-10-CM

## 2024-04-26 DIAGNOSIS — A449 Bartonellosis, unspecified: Secondary | ICD-10-CM | POA: Diagnosis not present

## 2024-04-26 DIAGNOSIS — W5503XA Scratched by cat, initial encounter: Secondary | ICD-10-CM

## 2024-04-26 DIAGNOSIS — J4540 Moderate persistent asthma, uncomplicated: Secondary | ICD-10-CM

## 2024-04-26 DIAGNOSIS — R1902 Left upper quadrant abdominal swelling, mass and lump: Secondary | ICD-10-CM

## 2024-04-26 DIAGNOSIS — I10 Essential (primary) hypertension: Secondary | ICD-10-CM

## 2024-04-26 MED ORDER — ATORVASTATIN CALCIUM 80 MG PO TABS: 80.0000 mg | ORAL_TABLET | Freq: Every day | ORAL | 3 refills | Status: AC

## 2024-04-26 MED ORDER — EMPAGLIFLOZIN 10 MG PO TABS: 10.0000 mg | ORAL_TABLET | Freq: Every day | ORAL | 3 refills | Status: AC

## 2024-04-26 MED ORDER — AZITHROMYCIN 250 MG PO TABS: ORAL_TABLET | ORAL | 0 refills | Status: AC

## 2024-04-26 MED ORDER — IBUPROFEN 600 MG PO TABS: 600.0000 mg | ORAL_TABLET | Freq: Three times a day (TID) | ORAL | 0 refills | Status: DC | PRN

## 2024-04-26 NOTE — Assessment & Plan Note (Signed)
 Unclear etiology of the current rash and mass, but cat scratch related lymphadenitis can cause similar presentation Check Bartonella antibody, CBC and CMP Started empiric azithromycin 

## 2024-04-26 NOTE — Progress Notes (Signed)
 Acute Office Visit  Subjective:    Patient ID: Erin Sexton, female    DOB: 1971/04/23, 53 y.o.   MRN: [id number]  Chief Complaint  Patient presents with   Skin Problem    Pt reports skin not looking better ongoing for 2 weeks, area is painful, under her left breast.    HPI Patient is in today for complaint of worsening left upper abdominal pain with mass like feeling.  She initially had a bump underneath left breast about 2 weeks ago, and was thought to be due to a spider bite.  She was given doxycycline  in the last week, which she has completed now.  She continued to have redness in the area, and has hard masslike sensation in the area now.  She has intense sharp pain in the left upper abdominal area for the last 3 days as well.  Upon further questioning, she reports that she has a cat at home and has occasional scratches while playing with her.  She has a scratch on her right leg, but denies any other known scratches.  Past Medical History:  Diagnosis Date   Abnormal CT scan    a. 02/2014: evaluated for dyspnea and initially placed on Xarelto  for PE because radiologist could not exclude small peripheral pulmonary emboli because of limited contrast. However, after admission for ++vaginal bleeding several days later, it was felt she did NOT have PE - underwent further testing with neg VQ 03/06/14, neg LE duplex.   Anemia    Anxiety    Arthritis    Asthma    Blood transfusion without reported diagnosis    Bone spur of right foot    CHF (congestive heart failure) (HCC)    COPD (chronic obstructive pulmonary disease) (HCC)    Depression    Diabetes mellitus without complication (HCC)    a. Dx 02/2014.   Heart murmur    History of echocardiogram 2015   normal EF, Grade II diastolic dysfunction   HOCM (hypertrophic obstructive cardiomyopathy) (HCC)    a. Dx 11914.   Hyperlipidemia    Hypertension    Morbid obesity (HCC)    NSVT (nonsustained ventricular tachycardia) (HCC)    a.  Noted on tele during 02/2014 adm for symptomatic anemia.   Stroke [facility])    SVT (supraventricular tachycardia) (HCC)    a. Noted on tele during 02/2014 adm for symptomatic anemia.   Thyroid  disease    Vaginal bleeding     Past Surgical History:  Procedure Laterality Date   COLONOSCOPY N/A 09/30/2019   Procedure: COLONOSCOPY;  Surgeon: Ruby Corporal, MD;  Location: [facility];  Service: Endoscopy;  Laterality: N/A;  730   DILITATION & CURRETTAGE/HYSTROSCOPY WITH NOVASURE ABLATION N/A 08/07/2016   Procedure: DILATATION & CURETTAGE/HYSTEROSCOPY WITH ATTPEMPTED NOVASURE ABLATION AND INSERTION OF IUD;  Surgeon: Wendelyn Halter, MD;  Location: [facility] ORS;  Service: Gynecology;  Laterality: N/A;    Family History  Problem Relation Age of Onset   Diabetes Mother    Arthritis Mother    Other Mother        WPW   Mental illness Mother        Depression   Asthma Mother    Obesity Mother    Heart disease Father        History of coronary bypass grafting at age 45   Hypertension Father    Mental illness Sister    Bipolar disorder Sister    Heart disease Sister  possibly WPW   Depression Sister    Varicose Veins Sister    Uterine cancer Maternal Grandmother    Mental illness Maternal Grandmother    Arthritis Maternal Grandmother    Hyperlipidemia Maternal Grandmother    Heart disease Maternal Grandfather    CAD Neg Hx        No family history of EARLY CAD    Social History   Socioeconomic History   Marital status: Divorced    Spouse name: Not on file   Number of children: 0   Years of education: Not on file   Highest education level: Associate degree: academic program  Occupational History   Occupation: Disabled  Tobacco Use   Smoking status: Never   Smokeless tobacco: Never  Vaping Use   Vaping status: Never Used  Substance and Sexual Activity   Alcohol use: No   Drug use: No   Sexual activity: Not Currently    Birth control/protection: I.U.D., Post-menopausal     Comment: IUD in place to control vaginal bleeding  Other Topics Concern   Not on file  Social History Narrative   Divorced for 3 years,married for 5 years.On disability since 2009 secondary to mental illness.   Social Drivers of Corporate investment banker Strain: Low Risk  (02/12/2024)   Overall Financial Resource Strain (CARDIA)    Difficulty of Paying Living Expenses: Not hard at all  Food Insecurity: No Food Insecurity (02/12/2024)   Hunger Vital Sign    Worried About Running Out of Food in the Last Year: Never true    Ran Out of Food in the Last Year: Never true  Transportation Needs: No Transportation Needs (02/12/2024)   PRAPARE - Administrator, Civil Service (Medical): No    Lack of Transportation (Non-Medical): No  Physical Activity: Sufficiently Active (02/12/2024)   Exercise Vital Sign    Days of Exercise per Week: 5 days    Minutes of Exercise per Session: 30 min  Stress: Stress Concern Present (02/12/2024)   [employer] of Occupational Health - Occupational Stress Questionnaire    Feeling of Stress : To some extent  Social Connections: Socially Isolated (02/12/2024)   Social Connection and Isolation Panel [NHANES]    Frequency of Communication with Friends and Family: Twice a week    Frequency of Social Gatherings with Friends and Family: Never    Attends Religious Services: Never    Database administrator or Organizations: No    Attends Banker Meetings: Never    Marital Status: Divorced  Catering manager Violence: Not At Risk (10/01/2023)   Humiliation, Afraid, Rape, and Kick questionnaire    Fear of Current or Ex-Partner: No    Emotionally Abused: No    Physically Abused: No    Sexually Abused: No    Outpatient Medications Prior to Visit  Medication Sig Dispense Refill   ACCU-CHEK GUIDE TEST test strip USE 1 STRIP TO CHECK GLUCOSE ONCE DAILY 100 each 0   Accu-Chek Softclix Lancets lancets USE 1 LANCET TO CHECK GLUCOSE ONCE DAILY 100 each  3   albuterol  (PROVENTIL ) (5 MG/ML) 0.5% nebulizer solution Take 0.5 mLs (2.5 mg total) by nebulization every 6 (six) hours as needed for wheezing or shortness of breath. 20 mL 0   albuterol  (VENTOLIN  HFA) 108 (90 Base) MCG/ACT inhaler Inhale 2 puffs into the lungs as needed (wheezing).      atorvastatin  (LIPITOR) 80 MG tablet Take 1 tablet (80 mg total) by mouth daily.  90 tablet 3   blood glucose meter kit and supplies KIT Dispense based on patient and insurance preference. Use once a day. Diagnosis code:E11.9 1 each 0   buPROPion  (WELLBUTRIN  XL) 300 MG 24 hr tablet Take 1 tablet (300 mg total) by mouth daily. 90 tablet 3   Cholecalciferol (VITAMIN D3 PO) Take 10,000 Units by mouth daily.     clindamycin (CLEOCIN T) 1 % lotion      clotrimazole -betamethasone  (LOTRISONE ) cream APPLY  CREAM TOPICALLY TO AFFECTED AREA TWICE DAILY 30 g 2   empagliflozin  (JARDIANCE ) 10 MG TABS tablet Take 1 tablet (10 mg total) by mouth daily. 90 tablet 3   estradiol  (ESTRACE ) 2 MG tablet TAKE 1 TABLET BY MOUTH AT BEDTIME 90 tablet 3   levocetirizine (XYZAL ) 5 MG tablet Take 1 tablet (5 mg total) by mouth every evening. 90 tablet 3   levonorgestrel  (MIRENA ) 20 MCG/24HR IUD 1 each by Intrauterine route once.     levothyroxine  (SYNTHROID ) 112 MCG tablet Take 1 tablet (112 mcg total) by mouth daily. 90 tablet 1   LORazepam  (ATIVAN ) 0.5 MG tablet Take 1 tablet 30 minutes prior to procedure. Please take additional tablet for persistent anxiety. 2 tablet 0   megestrol  (MEGACE ) 40 MG tablet Take 1 tablet (40 mg total) by mouth daily. 30 tablet 11   metoprolol  tartrate (LOPRESSOR ) 50 MG tablet Take 1 tablet by mouth twice daily 180 tablet 0   nystatin  (MYCOSTATIN /NYSTOP ) powder Apply 1 Application topically 3 (three) times daily. 60 g 0   Rimegepant Sulfate (NURTEC) 75 MG TBDP Take 1 tablet (75 mg total) by mouth as needed. 8 tablet 11   SYMBICORT  160-4.5 MCG/ACT inhaler Inhale 2 puffs into the lungs as needed. 1 each 2    tirzepatide  (MOUNJARO ) 15 MG/0.5ML Pen Inject 15 mg into the skin once a week. 6 mL 1   topiramate  (TOPAMAX ) 100 MG tablet Take 1 tablet (100 mg total) by mouth daily. 90 tablet 3   verapamil  (CALAN -SR) 120 MG CR tablet Take 1 tablet (120 mg total) by mouth at bedtime. 90 tablet 1   doxycycline  (VIBRA -TABS) 100 MG tablet Take 1 tablet (100 mg total) by mouth 2 (two) times daily. 10 tablet 0   No facility-administered medications prior to visit.    No Known Allergies  Review of Systems  Constitutional:  Negative for chills and fever.  HENT:  Negative for congestion, sinus pressure, sinus pain and sore throat.   Eyes:  Negative for pain and discharge.  Respiratory:  Negative for cough and shortness of breath.   Cardiovascular:  Positive for leg swelling. Negative for chest pain and palpitations.  Gastrointestinal:  Positive for abdominal pain. Negative for diarrhea, nausea and vomiting.  Endocrine: Negative for polydipsia and polyuria.  Genitourinary:  Negative for dysuria and hematuria.  Musculoskeletal:  Positive for arthralgias. Negative for neck pain and neck stiffness.  Skin:  Negative for rash.  Neurological:  Negative for dizziness and weakness.  Psychiatric/Behavioral:  Negative for agitation and behavioral problems.        Objective:     Physical Exam Vitals reviewed.  Constitutional:      General: She is not in acute distress.    Appearance: She is obese. She is not diaphoretic.  HENT:     Head: Normocephalic and atraumatic.     Nose: Nose normal. No congestion.     Mouth/Throat:     Mouth: Mucous membranes are moist.     Pharynx: No posterior oropharyngeal erythema.  Eyes:     General: No scleral icterus.    Extraocular Movements: Extraocular movements intact.  Cardiovascular:     Rate and Rhythm: Normal rate and regular rhythm.     Heart sounds: Normal heart sounds. No murmur heard. Pulmonary:     Breath sounds: Normal breath sounds. No wheezing or rales.   Abdominal:     Palpations: Abdomen is soft. There is mass (LUQ abdominal mass, about 5 cm X 3 cm, oval-shaped with multiple raised spots).     Tenderness: There is no abdominal tenderness.     Hernia: A hernia (Umbilical) is present.     Comments: Abdominal skin sagging - panniculus  Musculoskeletal:     Cervical back: Neck supple. No tenderness.     Right lower leg: No edema.     Left lower leg: No edema.  Skin:    General: Skin is warm.     Findings: No rash.     Comments: Varicose veins on LE bilaterally  Neurological:     General: No focal deficit present.     Mental Status: She is alert and oriented to person, place, and time.  Psychiatric:        Mood and Affect: Mood normal.        Behavior: Behavior normal.     BP 125/80   Pulse 78   Ht 5\' 9"  (1.753 m)   Wt 244 lb (110.7 kg)   SpO2 98%   BMI 36.03 kg/m  Wt Readings from Last 3 Encounters:  04/26/24 244 lb (110.7 kg)  04/23/24 243 lb (110.2 kg)  03/17/24 243 lb (110.2 kg)        Assessment & Plan:   Problem List Items Addressed This Visit       Musculoskeletal and Integument   Cat scratch - Primary   Unclear etiology of the current rash and mass, but cat scratch related lymphadenitis can cause similar presentation Check Bartonella antibody, CBC and CMP Started empiric azithromycin       Relevant Medications   azithromycin  (ZITHROMAX ) 250 MG tablet   ibuprofen  (ADVIL ) 600 MG tablet   Other Relevant Orders   US  Abdomen Limited   Bartonella Antibody Panel   CBC with Differential/Platelet   CMP14+EGFR     Other   Left upper quadrant abdominal mass   Appears to be LAD likely due to cat scratch Due to sudden onset of abdominal mass and increasing size of the mass with worsening pain, obtain US  of abdomen limited Ibuprofen  as needed for pain Warm compresses as needed for now      Relevant Orders   CBC with Differential/Platelet   CMP14+EGFR     Meds ordered this encounter  Medications    azithromycin  (ZITHROMAX ) 250 MG tablet    Sig: Take 2 tablets on day 1, then 1 tablet daily on days 2 through 5    Dispense:  6 tablet    Refill:  0   ibuprofen  (ADVIL ) 600 MG tablet    Sig: Take 1 tablet (600 mg total) by mouth every 8 (eight) hours as needed.    Dispense:  30 tablet    Refill:  0     Trevaris Pennella Alyssa Backbone, MD

## 2024-04-26 NOTE — Patient Instructions (Addendum)
 Please start taking Azithromycin  as prescribed.  Please take Ibuprofen  as needed for pain.

## 2024-04-26 NOTE — Assessment & Plan Note (Signed)
 Appears to be LAD likely due to cat scratch Due to sudden onset of abdominal mass and increasing size of the mass with worsening pain, obtain US  of abdomen limited Ibuprofen  as needed for pain Warm compresses as needed for now

## 2024-04-27 ENCOUNTER — Ambulatory Visit: Payer: Self-pay | Admitting: Internal Medicine

## 2024-04-27 ENCOUNTER — Other Ambulatory Visit: Payer: Self-pay | Admitting: Internal Medicine

## 2024-04-27 ENCOUNTER — Ambulatory Visit (HOSPITAL_COMMUNITY)
Admission: RE | Admit: 2024-04-27 | Discharge: 2024-04-27 | Disposition: A | Discharge status: Home / Self Care | Source: Ambulatory Visit | Attending: Internal Medicine | Admitting: Internal Medicine

## 2024-04-27 DIAGNOSIS — L299 Pruritus, unspecified: Secondary | ICD-10-CM | POA: Insufficient documentation

## 2024-04-27 DIAGNOSIS — R935 Abnormal findings on diagnostic imaging of other abdominal regions, including retroperitoneum: Secondary | ICD-10-CM | POA: Insufficient documentation

## 2024-04-27 DIAGNOSIS — L539 Erythematous condition, unspecified: Secondary | ICD-10-CM | POA: Diagnosis not present

## 2024-04-27 DIAGNOSIS — R1902 Left upper quadrant abdominal swelling, mass and lump: Secondary | ICD-10-CM

## 2024-04-27 DIAGNOSIS — W5503XA Scratched by cat, initial encounter: Secondary | ICD-10-CM | POA: Insufficient documentation

## 2024-04-28 ENCOUNTER — Ambulatory Visit (HOSPITAL_COMMUNITY)
Admission: RE | Admit: 2024-04-28 | Discharge: 2024-04-28 | Disposition: A | Discharge status: Home / Self Care | Source: Ambulatory Visit | Attending: Internal Medicine | Admitting: Internal Medicine

## 2024-04-28 DIAGNOSIS — R1902 Left upper quadrant abdominal swelling, mass and lump: Secondary | ICD-10-CM | POA: Diagnosis not present

## 2024-04-28 DIAGNOSIS — K8020 Calculus of gallbladder without cholecystitis without obstruction: Secondary | ICD-10-CM | POA: Diagnosis not present

## 2024-04-28 DIAGNOSIS — R161 Splenomegaly, not elsewhere classified: Secondary | ICD-10-CM | POA: Diagnosis not present

## 2024-04-28 MED ORDER — IOHEXOL 9 MG/ML PO SOLN
500.0000 mL | ORAL | Status: AC
  Administered 2024-04-28: 500 mL via ORAL

## 2024-04-28 MED ORDER — IOHEXOL 300 MG/ML  SOLN
75.0000 mL | Freq: Once | INTRAMUSCULAR | Status: AC | PRN
  Administered 2024-04-28: 75 mL via INTRAVENOUS

## 2024-04-29 ENCOUNTER — Other Ambulatory Visit: Payer: Self-pay | Admitting: Internal Medicine

## 2024-04-29 ENCOUNTER — Ambulatory Visit: Payer: Self-pay | Admitting: Internal Medicine

## 2024-04-29 DIAGNOSIS — L02211 Cutaneous abscess of abdominal wall: Secondary | ICD-10-CM

## 2024-04-29 LAB — CMP14+EGFR
ALT: 36 IU/L — ABNORMAL HIGH (ref 0–32)
AST: 22 IU/L (ref 0–40)
Albumin: 4 g/dL (ref 3.8–4.9)
Alkaline Phosphatase: 99 IU/L (ref 44–121)
BUN/Creatinine Ratio: 15 (ref 9–23)
BUN: 22 mg/dL (ref 6–24)
Bilirubin Total: 0.5 mg/dL (ref 0.0–1.2)
CO2: 18 mmol/L — ABNORMAL LOW (ref 20–29)
Calcium: 9.2 mg/dL (ref 8.7–10.2)
Chloride: 112 mmol/L — ABNORMAL HIGH (ref 96–106)
Creatinine, Ser: 1.42 mg/dL — ABNORMAL HIGH (ref 0.57–1.00)
Globulin, Total: 2.1 g/dL (ref 1.5–4.5)
Glucose: 107 mg/dL — ABNORMAL HIGH (ref 70–99)
Potassium: 4.4 mmol/L (ref 3.5–5.2)
Sodium: 143 mmol/L (ref 134–144)
Total Protein: 6.1 g/dL (ref 6.0–8.5)
eGFR: 44 mL/min/{1.73_m2} — ABNORMAL LOW (ref >59)

## 2024-04-29 LAB — CBC WITH DIFFERENTIAL/PLATELET
Basophils Absolute: 0.1 10*3/uL (ref 0.0–0.2)
Basos: 1 %
EOS (ABSOLUTE): 0.4 10*3/uL (ref 0.0–0.4)
Eos: 5 %
Hematocrit: 48.6 % — ABNORMAL HIGH (ref 34.0–46.6)
Hemoglobin: 16.7 g/dL — ABNORMAL HIGH (ref 11.1–15.9)
Immature Grans (Abs): 0 10*3/uL (ref 0.0–0.1)
Immature Granulocytes: 0 %
Lymphocytes Absolute: 2.8 10*3/uL (ref 0.7–3.1)
Lymphs: 30 %
MCH: 33.1 pg — ABNORMAL HIGH (ref 26.6–33.0)
MCHC: 34.4 g/dL (ref 31.5–35.7)
MCV: 96 fL (ref 79–97)
Monocytes Absolute: 0.5 10*3/uL (ref 0.1–0.9)
Monocytes: 5 %
Neutrophils Absolute: 5.6 10*3/uL (ref 1.4–7.0)
Neutrophils: 59 %
Platelets: 286 10*3/uL (ref 150–450)
RBC: 5.04 x10E6/uL (ref 3.77–5.28)
RDW: 11.9 % (ref 11.7–15.4)
WBC: 9.4 10*3/uL (ref 3.4–10.8)

## 2024-04-29 LAB — BARTONELLA ANTIBODY PANEL
B Quintana IgM: NEGATIVE {titer}
B henselae IgG: NEGATIVE {titer}
B henselae IgM: NEGATIVE {titer}
B quintana IgG: NEGATIVE {titer}

## 2024-05-04 ENCOUNTER — Telehealth: Payer: Self-pay

## 2024-05-04 ENCOUNTER — Telehealth: Payer: Self-pay | Admitting: Internal Medicine

## 2024-05-04 NOTE — Telephone Encounter (Signed)
   Pre-operative Risk Assessment    Patient Name: Erin Sexton  DOB: 06/10/1971 MRN: [id number]      Request for Surgical Clearance    Procedure:  panniculectomy  Date of Surgery:  Clearance TBD                                 Surgeon:  Dr. Larraine Plush Surgeon's Group or Practice Name:  [facility] Phone number:  [phone] Fax number:  [phone]   Type of Clearance Requested:   - Medical  - Pharmacy:  Hold Please advise      Type of Anesthesia:  General    Additional requests/questions:  Please advise surgeon/provider what medications should be held. Please fax a copy of Clearance to the surgeon's office.  Signed, Georgia Kipper   05/04/2024, 12:55 PM

## 2024-05-04 NOTE — Telephone Encounter (Signed)
 Updated surgeons office that the final Preop clearance should be sent over at latest tomorrow.

## 2024-05-04 NOTE — Telephone Encounter (Signed)
 Talked to Charles Connor, NP and she is going to reach out to Lasalle Pointer, NP about Final Preop clearance.  Will update surgeons office that it may be tomorrow before final preop clearance is sent.

## 2024-05-04 NOTE — Telephone Encounter (Signed)
Office is calling for update. Please advise 

## 2024-05-04 NOTE — Telephone Encounter (Signed)
 Re-faxed the surgical req form with confirmation of receipt.  I also called the office, they will call me  back. The pre-operative note from the NP on 5/13 was confusion. She said high risk, but function was excellent.

## 2024-05-04 NOTE — Telephone Encounter (Signed)
 Good Erin Sexton,  We have received a surgical clearance request for Mr. Erin Sexton. They were seen recently in clinic on 04/23/2024.  I reviewed your office note which stated clearance pending consultation with attending cardiologist.  He received an urgent follow-up from the requesting provider office and was reaching out to see if a decision was made regarding clearance.  Please forward you guidance and recommendations to [facility] PREOP   Thanks, Charles Connor, NP

## 2024-05-06 ENCOUNTER — Encounter: Payer: Self-pay | Admitting: Surgery

## 2024-05-06 ENCOUNTER — Ambulatory Visit (INDEPENDENT_AMBULATORY_CARE_PROVIDER_SITE_OTHER): Admitting: Surgery

## 2024-05-06 VITALS — BP 123/82 | HR 74 | Temp 98.1°F | Resp 16 | Ht 69.0 in | Wt 244.0 lb

## 2024-05-06 DIAGNOSIS — L02211 Cutaneous abscess of abdominal wall: Secondary | ICD-10-CM

## 2024-05-06 MED ORDER — SULFAMETHOXAZOLE-TRIMETHOPRIM 800-160 MG PO TABS: 1.0000 | ORAL_TABLET | Freq: Two times a day (BID) | ORAL | 0 refills | Status: DC

## 2024-05-06 NOTE — Patient Instructions (Signed)
 Dressing Change Instructions: -Pull packing out from the incision site -May shower and let water run over top of the incision.  Pat dry once out of the shower -Use a Q-tip to pack the wound.  The wound tracks towards your left side -Cover with gauze -Change dressing daily -Call if the area starts having worsening purulent drainage or pain

## 2024-05-07 ENCOUNTER — Ambulatory Visit (INDEPENDENT_AMBULATORY_CARE_PROVIDER_SITE_OTHER): Admitting: Surgery

## 2024-05-07 DIAGNOSIS — L02211 Cutaneous abscess of abdominal wall: Secondary | ICD-10-CM

## 2024-05-07 NOTE — Telephone Encounter (Signed)
   Patient Name: Erin Sexton  DOB: 31-Jul-1971 MRN: [id number]  Primary Cardiologist: Vishnu P Mallipeddi, MD  Chart reviewed as part of pre-operative protocol coverage. Given past medical history and time since last visit, based on ACC/AHA guidelines, AMARA JUSTEN is at acceptable risk for the planned procedure without further cardiovascular testing.   Per Dr. Amanda Jungling 05/06/2024 "Based on your note, she is asymptomatic. She is at a moderate risk. Continue BB and Verapamil  throughout the perioperative period without any interruptions. Avoid reduced preload, reduced afterload and tachycardia. If hypotension develops, start IVF; vasopressin or phenylephrine are the pressors of choice."   The patient was advised that if she develops new symptoms prior to surgery to contact our office to arrange for a follow-up visit, and she verbalized understanding.  I will route this recommendation to the requesting party via Epic fax function and remove from pre-op pool.  Please call with questions.  Friddie Jetty, NP 05/07/2024, 8:35 AM

## 2024-05-07 NOTE — Progress Notes (Signed)
 Patient seen in office for dressing and packing change to abdominal wall I&D.   Packing removed. Wound cleansed with Normal Saline. Wound bed noted as beefy red.   Minimal bleeding noted. Patient reports that area did bleed copiously last night.   Iodoform 1/2 inch gauze packed into wound and pressure dressing applied.

## 2024-05-10 ENCOUNTER — Ambulatory Visit (INDEPENDENT_AMBULATORY_CARE_PROVIDER_SITE_OTHER): Admitting: Surgery

## 2024-05-10 DIAGNOSIS — L02211 Cutaneous abscess of abdominal wall: Secondary | ICD-10-CM

## 2024-05-10 LAB — WOUND CULTURE
MICRO NUMBER:: 16514840
RESULT:: NO GROWTH
SPECIMEN QUALITY:: ADEQUATE

## 2024-05-10 NOTE — Progress Notes (Signed)
 Patient seen in office for dressing and packing change to abdominal wall wound.    Packing removed. Wound cleansed with Normal Saline. Wound bed noted as beefy red.    Minimal bleeding noted. Patient reports no issues over the weekend.    Iodoform 1/2 inch gauze packed into wound and pressure dressing applied.   Patient to follow up with provider on 05/12/2024.

## 2024-05-10 NOTE — Progress Notes (Addendum)
 [facility] History and Physical  Reason for Referral:Abdominal wall abscess Referring Physician: Dr. Lydia Sams  Chief Complaint   New Patient (Initial Visit)     Erin Sexton is a 53 y.o. female.  HPI: Patient presents for evaluation of an abdominal wall abscess.  She first noted an area that was concerning for tick bite on 5/6.  The area started out as a small bump and increased in size and started to become more painful.  The area did have some drainage, that was noted to be green.  She followed up with her primary care doctor who prescribed her doxycycline  for 5 days and obtained abdominal imaging.  They started with an abdominal ultrasound which demonstrated a complex fluid collection in the left flank recommending CT scan.  CT of the abdomen demonstrated a lobulated enhancing fluid collection in the subcutaneous tissues of the lateral lower left chest wall measuring 2.2 x 2.5 x 5.1 cm, worrisome for abscess.  This imaging study was performed on 5/21.  Since the imaging and antibiotics, the area has decreased in size but it is still tender.  The area was initially erythematous, but the erythema has since subsided.  She does have a history of boils in her armpits and has required incision and drainage for these in the past.  Her past medical history significant for diabetes, depression/anxiety, hypertension, and CHF.  She denies use of blood thinning medications.  She denies any history of abdominal surgeries.  She denies use of tobacco products, alcohol, and illicit drugs.  Past Medical History:  Diagnosis Date   Abnormal CT scan    a. 02/2014: evaluated for dyspnea and initially placed on Xarelto  for PE because radiologist could not exclude small peripheral pulmonary emboli because of limited contrast. However, after admission for ++vaginal bleeding several days later, it was felt she did NOT have PE - underwent further testing with neg VQ 03/06/14, neg LE duplex.   Anemia     Anxiety    Arthritis    Asthma    Blood transfusion without reported diagnosis    Bone spur of right foot    CHF (congestive heart failure) (HCC)    COPD (chronic obstructive pulmonary disease) (HCC)    Depression    Diabetes mellitus without complication (HCC)    a. Dx 02/2014.   Heart murmur    History of echocardiogram 2015   normal EF, Grade II diastolic dysfunction   HOCM (hypertrophic obstructive cardiomyopathy) (HCC)    a. Dx 14782.   Hyperlipidemia    Hypertension    Morbid obesity (HCC)    NSVT (nonsustained ventricular tachycardia) (HCC)    a. Noted on tele during 02/2014 adm for symptomatic anemia.   Stroke [facility])    SVT (supraventricular tachycardia) (HCC)    a. Noted on tele during 02/2014 adm for symptomatic anemia.   Thyroid  disease    Vaginal bleeding     Past Surgical History:  Procedure Laterality Date   COLONOSCOPY N/A 09/30/2019   Procedure: COLONOSCOPY;  Surgeon: Ruby Corporal, MD;  Location: [facility]ITE;  Service: Endoscopy;  Laterality: N/A;  730   DILITATION & CURRETTAGE/HYSTROSCOPY WITH NOVASURE ABLATION N/A 08/07/2016   Procedure: DILATATION & CURETTAGE/HYSTEROSCOPY WITH ATTPEMPTED NOVASURE ABLATION AND INSERTION OF IUD;  Surgeon: Wendelyn Halter, MD;  Location: [facility] ORS;  Service: Gynecology;  Laterality: N/A;    Family History  Problem Relation Age of Onset   Diabetes Mother    Arthritis Mother  Other Mother        WPW   Mental illness Mother        Depression   Asthma Mother    Obesity Mother    Heart disease Father        History of coronary bypass grafting at age 45   Hypertension Father    Mental illness Sister    Bipolar disorder Sister    Heart disease Sister        possibly WPW   Depression Sister    Varicose Veins Sister    Uterine cancer Maternal Grandmother    Mental illness Maternal Grandmother    Arthritis Maternal Grandmother    Hyperlipidemia Maternal Grandmother    Heart disease Maternal Grandfather    CAD Neg Hx         No family history of EARLY CAD    Social History   Tobacco Use   Smoking status: Never   Smokeless tobacco: Never  Vaping Use   Vaping status: Never Used  Substance Use Topics   Alcohol use: No   Drug use: No    Medications: I have reviewed the patient's current medications. Allergies as of 05/06/2024   No Known Allergies      Medication List        Accurate as of May 06, 2024 11:59 PM. If you have any questions, ask your nurse or doctor.          Accu-Chek Guide Test test strip Generic drug: glucose blood USE 1 STRIP TO CHECK GLUCOSE ONCE DAILY   Accu-Chek Softclix Lancets lancets USE 1 LANCET TO CHECK GLUCOSE ONCE DAILY   albuterol  108 (90 Base) MCG/ACT inhaler Commonly known as: VENTOLIN  HFA Inhale 2 puffs into the lungs as needed (wheezing).   albuterol  (5 MG/ML) 0.5% nebulizer solution Commonly known as: PROVENTIL  Take 0.5 mLs (2.5 mg total) by nebulization every 6 (six) hours as needed for wheezing or shortness of breath.   atorvastatin  80 MG tablet Commonly known as: LIPITOR Take 1 tablet (80 mg total) by mouth daily.   blood glucose meter kit and supplies Kit Dispense based on patient and insurance preference. Use once a day. Diagnosis code:E11.9   buPROPion  300 MG 24 hr tablet Commonly known as: WELLBUTRIN  XL Take 1 tablet (300 mg total) by mouth daily.   clindamycin 1 % lotion Commonly known as: CLEOCIN T   clotrimazole -betamethasone  cream Commonly known as: LOTRISONE  APPLY  CREAM TOPICALLY TO AFFECTED AREA TWICE DAILY   empagliflozin  10 MG Tabs tablet Commonly known as: Jardiance  Take 1 tablet (10 mg total) by mouth daily.   estradiol  2 MG tablet Commonly known as: ESTRACE  TAKE 1 TABLET BY MOUTH AT BEDTIME   ibuprofen  600 MG tablet Commonly known as: ADVIL  Take 1 tablet (600 mg total) by mouth every 8 (eight) hours as needed.   levocetirizine 5 MG tablet Commonly known as: XYZAL  Take 1 tablet (5 mg total) by mouth every  evening.   levonorgestrel  20 MCG/24HR IUD Commonly known as: MIRENA  1 each by Intrauterine route once.   levothyroxine  112 MCG tablet Commonly known as: SYNTHROID  Take 1 tablet (112 mcg total) by mouth daily.   LORazepam  0.5 MG tablet Commonly known as: ATIVAN  Take 1 tablet 30 minutes prior to procedure. Please take additional tablet for persistent anxiety.   megestrol  40 MG tablet Commonly known as: MEGACE  Take 1 tablet (40 mg total) by mouth daily.   metoprolol  tartrate 50 MG tablet Commonly known as: LOPRESSOR  Take 1  tablet by mouth twice daily   Nurtec 75 MG Tbdp Generic drug: Rimegepant Sulfate Take 1 tablet (75 mg total) by mouth as needed.   nystatin  powder Commonly known as: MYCOSTATIN /NYSTOP  Apply 1 Application topically 3 (three) times daily.   sulfamethoxazole -trimethoprim  800-160 MG tablet Commonly known as: BACTRIM  DS Take 1 tablet by mouth 2 (two) times daily for 7 days. Started by: Demetrice Amstutz A Miachel Nardelli   Symbicort  160-4.5 MCG/ACT inhaler Generic drug: budesonide-formoterol Inhale 2 puffs into the lungs as needed.   tirzepatide  15 MG/0.5ML Pen Commonly known as: MOUNJARO  Inject 15 mg into the skin once a week.   topiramate  100 MG tablet Commonly known as: TOPAMAX  Take 1 tablet (100 mg total) by mouth daily.   verapamil  120 MG CR tablet Commonly known as: CALAN -SR Take 1 tablet (120 mg total) by mouth at bedtime.   VITAMIN D3 PO Take 10,000 Units by mouth daily.         ROS:  Constitutional: negative for chills, fatigue, and fevers Eyes: negative for visual disturbance and pain Ears, nose, mouth, throat, and face: negative for ear drainage, sore throat, and sinus problems Respiratory: negative for cough, wheezing, and shortness of breath Cardiovascular: negative for chest pain and palpitations Gastrointestinal: negative for abdominal pain, nausea, reflux symptoms, and vomiting Genitourinary:negative for dysuria and  frequency Integument/breast: positive for rash, negative for dryness Hematologic/lymphatic: negative for bleeding and lymphadenopathy Musculoskeletal:negative for back pain and neck pain Neurological: negative for dizziness and tremors Endocrine: negative for temperature intolerance  Blood pressure 123/82, pulse 74, temperature 98.1 F (36.7 C), temperature source Oral, resp. rate 16, height 5\' 9"  (1.753 m), weight 244 lb (110.7 kg), SpO2 97%. Physical Exam Vitals reviewed.  Constitutional:      Appearance: Normal appearance.  HENT:     Head: Normocephalic and atraumatic.  Eyes:     Extraocular Movements: Extraocular movements intact.     Pupils: Pupils are equal, round, and reactive to light.  Cardiovascular:     Rate and Rhythm: Normal rate and regular rhythm.  Pulmonary:     Effort: Pulmonary effort is normal.     Breath sounds: Normal breath sounds.  Abdominal:     Comments: Abdomen soft, nondistended, no percussion tenderness, nontender to palpation; no rigidity, guarding, rebound tenderness; palpable fullness in the left upper quadrant with tenderness and a central area of fluctuance, no surrounding erythema  Musculoskeletal:        General: Normal range of motion.     Cervical back: Normal range of motion.  Skin:    General: Skin is warm and dry.  Neurological:     General: No focal deficit present.     Mental Status: She is alert and oriented to person, place, and time.  Psychiatric:        Mood and Affect: Mood normal.        Behavior: Behavior normal.     Results: Abdominal ultrasound (04/27/2024): IMPRESSION: Complex fluid collection identified along the left flank. Possibilities have a broad differential including hematoma or infection versus other. Please correlate with specific symptoms and further cross-sectional imaging workup may be useful such as contrast CT.  CT abdomen (04/28/2024): IMPRESSION: 1. Lobulated enhancing fluid collection in the  subcutaneous tissues of the lateral lower left chest wall measuring 2.2 x 2.5 x 5.1 cm. Findings are worrisome for abscess. 2. Cholelithiasis. 3. Mild splenomegaly. 4. 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  Assessment & Plan:  Erin Sexton is a 53 y.o. female who presents for  evaluation of an abdominal wall abscess.  -We discussed the pathophysiology of abscesses, and that there is multiple different ways that he could have formed, but somehow bacteria got under the skin and resulted with this infection.  Since there is a central area of fluctuance, we can perform an incision and drainage in the office today to see if this alleviates her symptoms -I did explain given the size of the abscess, that she may require further intervention in the operating room if it fails to improve with outpatient interventions -The risk and benefits of incision and drainage of left abdominal wall abscess were discussed including but not limited to bleeding, infection, and need for additional procedures.  After careful consideration, JANAKI EXLEY has decided to proceed with incision and drainage in office today.  -Please refer below for details of I&D -Await results of wound culture -Prescription provided for 7 days of Bactrim  -Discussed with the patient that she or her family member will need to perform daily dressing changes by inserting half-inch iodoform packing into the I&D cavity site, and covering with gauze.  Also advised that she can come to the office for assistance with dressing changes if needed.  Dressing change instructions provided to the patient -Follow up with me next week for wound recheck  All questions were answered to the satisfaction of the patient.  Bedside procedure note   Preoperative Diagnosis:Abdominal wall abscess Postoperative Diagnosis: Abdominal wall abscess   Procedure(s) Performed: Incision and drainage of a left abdominal wall abscess   Performing provider: Bynum Cassis  Amatullah Christy, DO   Estimated Blood Loss: Minimal   Findings: Left abdominal wall abscess with tracking at the 3 o'clock position, minimal purulent drainage noted, significant surrounding induration   Procedure: At bedside, left abdomen was prepped with Betadine.  Verbal and written consent were obtained from the patient prior to beginning of procedure.  Timeout was performed.  The area was localized with lidocaine  with epinephrine .  Using scalpel, a single straight incision was made in the area of fluctuance.  There was minimal purulent, bloody drainage noted.  Wound culture obtained.  The area was probed and all loculations were broken up with a finger sweep.  The cavity was noted to track at the 3 o'clock position.  The area was copiously irrigated with saline, and the cavity was then packed with half-inch iodoform.  The area was dressed with 4 x 4's and Medipore tape.  Patient tolerated the procedure without issue.   Note: Portions of this report may have been transcribed using voice recognition software. Every effort has been made to ensure accuracy; however, inadvertent computerized transcription errors may still be present.   Lidia Reels, DO [facility] [address] [place] [place], [place] [address] [phone] (office)

## 2024-05-12 ENCOUNTER — Ambulatory Visit (INDEPENDENT_AMBULATORY_CARE_PROVIDER_SITE_OTHER): Admitting: Surgery

## 2024-05-12 ENCOUNTER — Encounter: Payer: Self-pay | Admitting: Surgery

## 2024-05-12 VITALS — BP 153/86 | HR 76 | Temp 98.2°F | Resp 16 | Ht 69.0 in | Wt 238.0 lb

## 2024-05-12 DIAGNOSIS — L02211 Cutaneous abscess of abdominal wall: Secondary | ICD-10-CM

## 2024-05-12 NOTE — Progress Notes (Unsigned)
 [facility] Note   HPI:  53 y.o. Female presents to clinic for post-op follow-up s/p abdominal wall incision and drainage in office on 5/29.  She has been doing well since the procedure.  She will only intermittently get some stabbing pain at the incision site but it only last for couple of seconds.  Otherwise she denies any pain associated with the area.  She states that the swelling has significantly improved.  She denies any fevers or chills.  She has only noted some clear yellow drainage without any purulent drainage.  Denies fevers and chills.  Review of Systems:  All other review of systems: otherwise negative   Vital Signs:  BP (!) 153/86   Pulse 76   Temp 98.2 F (36.8 C) (Oral)   Resp 16   Ht 5\' 9"  (1.753 m)   Wt 238 lb (108 kg)   SpO2 96%   BMI 35.15 kg/m    Physical Exam:  Physical Exam Vitals reviewed.  Constitutional:      Appearance: Normal appearance.  Abdominal:     Comments: Abdomen soft, nondistended, no percussion tenderness, nontender to palpation; no rigidity, guarding, rebound tenderness; left upper quadrant I&D site with pink granulation tissue at the base, significantly improved surrounding induration, no purulent drainage noted  Neurological:     Mental Status: She is alert.     Laboratory studies: None   Imaging:  None  Wound culture: Status: Final result     Next appt: 05/14/2024 at 10:00 AM in General Surgery (Huntley Knoop A Paityn Balsam, DO)     Dx: Abscess of abdominal wall   Test Result Released: Yes (seen)   Specimen Information: Wound  0 Result Notes    Component Ref Range & Units (hover) 7 d ago  MICRO NUMBER: [id number]  SPECIMEN QUALITY: Adequate  SOURCE: LT ABD AREA  STATUS: FINAL  GRAM STAIN: No white blood cells seen Moderate epithelial cells No organisms seen  RESULT: No Growth       Assessment:  53 y.o. yo Female who presents for follow up s/p abdominal wall incision and drainage in office on  5/29.  Plan:  - Patient doing well since her I&D - Advised that we should continue with dressing changes every 2 days.  Patient is unable to do the dressing changes on her own and her family member is unable to assist her.  She is coming to the office 3 times a week for dressing changes.  Continue with iodoform packing and cover with gauze and bandage - Complete course of antibiotics prescribed last week - Follow up in 3 weeks  All of the above recommendations were discussed with the patient, and all of patient's questions were answered to her expressed satisfaction.  Note: Portions of this report may have been transcribed using voice recognition software. Every effort has been made to ensure accuracy; however, inadvertent computerized transcription errors may still be present.   Lidia Reels, DO [facility] [address] [place] [place] [place], [place] [address] [phone] (office)

## 2024-05-14 ENCOUNTER — Ambulatory Visit (INDEPENDENT_AMBULATORY_CARE_PROVIDER_SITE_OTHER): Admitting: Surgery

## 2024-05-14 DIAGNOSIS — L02211 Cutaneous abscess of abdominal wall: Secondary | ICD-10-CM

## 2024-05-14 NOTE — Progress Notes (Addendum)
 Patient seen in office for dressing and packing change to abdominal wall wound.    Packing removed. Wound cleansed with Normal Saline. Wound bed noted as beefy red.    No bleeding noted. Drainage on bandage noted serosanguinous. No purulent drainage noted.   Noted x3 small skin tears to the right of incision and x1 small skin tear to the left of incision. Tears appear to be from removing adhesive bandages. Cleansed tears. No bleeding from tears noted. Appears to be superficial skin layer loss. Applied triple antibiotic cream over areas and advised to not cover with adhesive bandage.   Iodoform 1/2 inch gauze packed into wound and bandage applied vertically to miss skin tears.   Patient will follow up for dressing change on Monday, 05/17/2024.

## 2024-05-17 ENCOUNTER — Ambulatory Visit (INDEPENDENT_AMBULATORY_CARE_PROVIDER_SITE_OTHER): Admitting: Surgery

## 2024-05-17 DIAGNOSIS — L02211 Cutaneous abscess of abdominal wall: Secondary | ICD-10-CM

## 2024-05-17 NOTE — Progress Notes (Signed)
 Patient seen in office for dressing and packing change to abdominal wall wound.    Packing removed. Wound cleansed with Normal Saline. Wound bed noted as beefy red.    No bleeding noted. Drainage on bandage noted serosanguinous. No purulent drainage noted.   Skin tears remain to the surrounding skin. No drainage or irritation noted to skin tears.    Packing removed. Wound bed beefy red. Noted cystic material to be expressed from the most proximal end of wound bed with slight irrigation of saline or pressure to the skin under incision. Education given to patient on cystic material found in wound bed. Advised that while yellow white in appearance, material is not purulent. Verbalized understanding.   Iodoform 1/2 inch gauze packed into wound and bandage applied.

## 2024-05-18 DIAGNOSIS — R42 Dizziness and giddiness: Secondary | ICD-10-CM | POA: Diagnosis not present

## 2024-05-19 ENCOUNTER — Ambulatory Visit: Admitting: Surgery

## 2024-05-19 DIAGNOSIS — L02211 Cutaneous abscess of abdominal wall: Secondary | ICD-10-CM

## 2024-05-19 NOTE — Progress Notes (Signed)
 Patient seen in office for dressing and packing change to abdominal wall wound.    Packing removed. Wound cleansed with Normal Saline. Wound bed noted as beefy red.    No bleeding noted. Drainage on bandage noted serosanguinous. No purulent drainage noted.   Irritation and tears to surrounding skin have improved and are healing as expected.    Packing removed. Wound bed beefy red. Noted cystic material to be expressed from the deep end of wound with irrigation with saline.    Iodoform 1/2 inch gauze packed into wound and bandage applied.  Recommended to continue 3x weekly packing change.

## 2024-05-20 ENCOUNTER — Encounter: Payer: Self-pay | Admitting: Internal Medicine

## 2024-05-21 ENCOUNTER — Ambulatory Visit (INDEPENDENT_AMBULATORY_CARE_PROVIDER_SITE_OTHER): Admitting: Surgery

## 2024-05-21 ENCOUNTER — Other Ambulatory Visit: Payer: Self-pay

## 2024-05-21 DIAGNOSIS — L02211 Cutaneous abscess of abdominal wall: Secondary | ICD-10-CM

## 2024-05-21 DIAGNOSIS — J4540 Moderate persistent asthma, uncomplicated: Secondary | ICD-10-CM

## 2024-05-21 DIAGNOSIS — E559 Vitamin D deficiency, unspecified: Secondary | ICD-10-CM

## 2024-05-21 DIAGNOSIS — F3341 Major depressive disorder, recurrent, in partial remission: Secondary | ICD-10-CM

## 2024-05-21 MED ORDER — BUPROPION HCL ER (XL) 300 MG PO TB24: 300.0000 mg | ORAL_TABLET | Freq: Every day | ORAL | 3 refills | Status: AC

## 2024-05-21 NOTE — Progress Notes (Signed)
 Patient seen in office for dressing and packing change to abdominal wall wound.    Packing removed. Wound cleansed with Normal Saline. Wound bed noted as beefy red. Granulation tissue noted to wound bed. Granulation tissue also noted to medial edge of incision overgrowing wound edges. Treated overgrowth with Silver  Nitrate.    Minimal bleeding noted. Drainage on bandage noted serosanguinous. No purulent drainage noted.   Irritation and tears to surrounding skin once again noted. Irritation is in the shape of adhesive of bandage.    Approximately 8 of Iodoform 1/2 inch gauze packed into wound and Mepilex 4 x 4 bandage applied.    Recommended to continue 3x weekly packing change.

## 2024-05-24 ENCOUNTER — Ambulatory Visit (INDEPENDENT_AMBULATORY_CARE_PROVIDER_SITE_OTHER): Admitting: General Surgery

## 2024-05-24 VITALS — Temp 98.0°F

## 2024-05-24 DIAGNOSIS — Z09 Encounter for follow-up examination after completed treatment for conditions other than malignant neoplasm: Secondary | ICD-10-CM

## 2024-05-24 DIAGNOSIS — L02211 Cutaneous abscess of abdominal wall: Secondary | ICD-10-CM

## 2024-05-24 NOTE — Progress Notes (Unsigned)
 Patient seen in office for dressing and packing change to abdominal wall wound.    Packing removed. Wound cleansed with Normal Saline. Wound bed noted as beefy red. Granulation tissue noted to wound bed.    Minimal bleeding noted. Drainage on bandage noted serosanguinous. No purulent drainage noted.   Irritation and tears to surrounding skin appear improved with Mepilex bandage.    Approximately 8 of Iodoform 1/2 inch gauze packed into wound and Mepilex 4 x 4 bandage applied.    Recommended to continue 3x weekly packing change.  Agree with treatment plan.

## 2024-05-26 ENCOUNTER — Ambulatory Visit (INDEPENDENT_AMBULATORY_CARE_PROVIDER_SITE_OTHER): Admitting: General Surgery

## 2024-05-26 DIAGNOSIS — Z09 Encounter for follow-up examination after completed treatment for conditions other than malignant neoplasm: Secondary | ICD-10-CM

## 2024-05-26 DIAGNOSIS — L02211 Cutaneous abscess of abdominal wall: Secondary | ICD-10-CM

## 2024-05-26 NOTE — Progress Notes (Signed)
 Patient seen in office for dressing and packing change to abdominal wall wound.    Packing removed. Wound cleansed with Normal Saline. Wound bed noted as beefy red. Granulation tissue noted to wound bed.    Minimal bleeding noted. Drainage on bandage noted serosanguinous. No purulent drainage noted.   Irritation to surrounding skin resolved with Mepilex bandage.    Approximately 7 of Iodoform 1/2 inch gauze packed into wound and Mepilex 4 x 4 bandage applied.    Recommended to continue 3x weekly packing change.   Attest:  Agree with treatment and plan.

## 2024-05-27 ENCOUNTER — Encounter: Payer: Self-pay | Admitting: General Surgery

## 2024-05-28 ENCOUNTER — Ambulatory Visit: Admitting: General Surgery

## 2024-05-28 ENCOUNTER — Other Ambulatory Visit: Payer: Self-pay

## 2024-05-28 VITALS — Temp 98.0°F | Ht 69.0 in | Wt 238.0 lb

## 2024-05-28 DIAGNOSIS — Z09 Encounter for follow-up examination after completed treatment for conditions other than malignant neoplasm: Secondary | ICD-10-CM

## 2024-05-28 DIAGNOSIS — L02211 Cutaneous abscess of abdominal wall: Secondary | ICD-10-CM

## 2024-05-28 NOTE — Progress Notes (Signed)
 Patient seen in office for dressing and packing change to abdominal wall wound.    Packing removed. Wound cleansed with Normal Saline. Wound bed noted as beefy red. Granulation tissue noted to wound bed.    Minimal bleeding noted. Drainage on bandage noted serosanguinous. No purulent drainage noted.   Approximately 7 of Iodoform 1/2 inch gauze packed into wound and Mepilex 4 x 4 bandage applied.    Recommended to continue 3x weekly packing change.

## 2024-05-29 ENCOUNTER — Encounter: Payer: Self-pay | Admitting: General Surgery

## 2024-05-29 NOTE — Progress Notes (Signed)
 Patient seen by APP.  This was attested.  Agree with treatment plan.

## 2024-05-31 ENCOUNTER — Encounter: Admitting: Genetic Counselor

## 2024-05-31 ENCOUNTER — Encounter: Admitting: Surgery

## 2024-06-01 ENCOUNTER — Ambulatory Visit (INDEPENDENT_AMBULATORY_CARE_PROVIDER_SITE_OTHER): Admitting: Surgery

## 2024-06-01 ENCOUNTER — Encounter: Payer: Self-pay | Admitting: Surgery

## 2024-06-01 VITALS — Temp 98.0°F | Ht 69.0 in | Wt 238.0 lb

## 2024-06-01 DIAGNOSIS — L02211 Cutaneous abscess of abdominal wall: Secondary | ICD-10-CM | POA: Diagnosis not present

## 2024-06-01 DIAGNOSIS — Z09 Encounter for follow-up examination after completed treatment for conditions other than malignant neoplasm: Secondary | ICD-10-CM

## 2024-06-01 MED ORDER — SULFAMETHOXAZOLE-TRIMETHOPRIM 800-160 MG PO TABS: 1.0000 | ORAL_TABLET | Freq: Two times a day (BID) | ORAL | 0 refills | Status: AC

## 2024-06-01 NOTE — Progress Notes (Signed)
 Patient seen in office for dressing and packing change to abdominal wall wound.    Packing removed. Wound cleansed with Normal Saline. Slight bleeding noted. Drainage on bandage noted purulent and foul smelling. Patient reports hard knot, approximately quarter sized to left side of incision that is tender and expresses purulent drainage when pressed. Culture obtained of drainage.    Approximately 8 of Iodoform 1/2 inch gauze packed into wound and Mepilex 4 x 4 bandage applied.    Recommended to continue 3x weekly packing change.  Provider made aware and new orders obtained for Bactrim  DS BID X5 days. Prescription sent to pharmacy.

## 2024-06-02 ENCOUNTER — Ambulatory Visit: Admitting: Surgery

## 2024-06-02 ENCOUNTER — Encounter: Payer: Self-pay | Admitting: Surgery

## 2024-06-02 VITALS — BP 158/94 | HR 75 | Temp 98.0°F | Resp 16 | Ht 69.0 in | Wt 241.0 lb

## 2024-06-02 DIAGNOSIS — L02211 Cutaneous abscess of abdominal wall: Secondary | ICD-10-CM | POA: Diagnosis not present

## 2024-06-02 DIAGNOSIS — Z09 Encounter for follow-up examination after completed treatment for conditions other than malignant neoplasm: Secondary | ICD-10-CM | POA: Diagnosis not present

## 2024-06-02 LAB — WOUND CULTURE: SPECIMEN QUALITY:: ADEQUATE

## 2024-06-02 NOTE — Progress Notes (Signed)
 [facility] Note   HPI:  53 y.o. Female presents to clinic for follow up s/p left abdominal abscess I&D in office on 5/29.  Starting this past Sunday, she noted an area lateral to the I&D site that was increasing in hardness and more tender.  She underwent a dressing change yesterday, at which time there was some purulent drainage noted from the lateral aspect of the incision.  This was cultured by my nurse and we started her on Bactrim .  Since then, she denies any further purulent drainage.  She denies any fevers or chills.  The area still hurts to touch, but she feels the area has softened and become a little bit smaller.  Review of Systems:  All other review of systems: otherwise negative   Vital Signs:  BP (!) 158/94   Pulse 75   Temp 98 F (36.7 C) (Oral)   Resp 16   Ht 5' 9 (1.753 m)   Wt 241 lb (109.3 kg)   SpO2 96%   BMI 35.59 kg/m    Physical Exam:  Physical Exam Vitals reviewed.  Constitutional:      Appearance: Normal appearance.  Abdominal:     Comments: Abdomen soft, nondistended, no percussion tenderness, nontender to palpation; no rigidity, guarding, rebound tenderness; left upper quadrant I&D site with pink granulation tissue at the base, induration at lateral aspect of incision, no purulent drainage noted, area probed with finger with no palpable loculations   Neurological:     Mental Status: She is alert.     Laboratory studies:   Wound culture (06/01/24): MICRO NUMBER: [id number] P  SPECIMEN QUALITY: Adequate P  SOURCE: L SIDE ABD WALL ABSCESS P  STATUS: PRELIMINARY P  GRAM STAIN: No white blood cells seen Few epithelial cells Moderate Gram negative bacilli P    Imaging:  None   Assessment:  53 y.o. yo Female who presents for follow up s/p left abdominal abscess I&D in office on 5/29.  Plan:  - Discussed with the patient that she might have been developing another abscess cavity, but I believe with dressing change yesterday, the  area has completely drained.  I probed the area and do not feel any other fluid collections. - Will hold off on performing any further incision and drainage at this time - Initial wound cultures demonstrating gram-negative bacilli.  Will continue with Bactrim  - Continue packing with half-inch iodoform - Advised that the patient should come in this Thursday, and Friday for nursing dressing changes.  I also want her coming in next Monday and Tuesday for nursing dressing changes.  She will follow-up with me next Thursday  All of the above recommendations were discussed with the patient, and all of patient's questions were answered to her expressed satisfaction.  Note: Portions of this report may have been transcribed using voice recognition software. Every effort has been made to ensure accuracy; however, inadvertent computerized transcription errors may still be present.   Dorothyann Brittle, DO [facility] [address] [place], [place] [address] [phone] (office)

## 2024-06-03 ENCOUNTER — Encounter: Payer: Self-pay | Admitting: Surgery

## 2024-06-03 ENCOUNTER — Ambulatory Visit (INDEPENDENT_AMBULATORY_CARE_PROVIDER_SITE_OTHER): Admitting: Surgery

## 2024-06-03 VITALS — BP 126/80 | HR 81 | Temp 97.7°F | Resp 16 | Ht 69.0 in | Wt 241.0 lb

## 2024-06-03 DIAGNOSIS — Z09 Encounter for follow-up examination after completed treatment for conditions other than malignant neoplasm: Secondary | ICD-10-CM

## 2024-06-03 DIAGNOSIS — L02211 Cutaneous abscess of abdominal wall: Secondary | ICD-10-CM

## 2024-06-03 NOTE — Progress Notes (Signed)
 Patient seen in office for dressing and packing change to abdominal wall wound.   Packing removed. Wound cleansed with Normal Saline. Slight bleeding noted.    Approximately 10 of Iodoform 1/2 inch gauze packed into wound and Mepilex 4 x 4 bandage applied.   Preliminary culture results show moderate gram negative bacilli. Awaiting sensitivity. Patient is currently taking Bactrim  which is broad spectrum and works well for gram negative bacilli. Advised to continue ABTx as directed. Advised that once sensitivity results, ABTx may be changed.  Patient is scheduled for daily dressing changes until follow up with provider on 06/10/2024.  Future Appointments  Date Time Provider Department   06/04/2024 10:00 AM Pappayliou, Dorothyann LABOR, DO [facility]   06/07/2024 10:00 AM Pappayliou, Dorothyann LABOR, DO [facility]   06/08/2024  8:45 AM Pappayliou, Dorothyann LABOR, DO [facility]   06/09/2024 10:00 AM Pappayliou, Dorothyann LABOR, DO [facility]   06/10/2024  2:45 PM Pappayliou, Dorothyann LABOR, DO [facility]

## 2024-06-04 ENCOUNTER — Ambulatory Visit: Payer: Self-pay | Admitting: *Deleted

## 2024-06-04 ENCOUNTER — Ambulatory Visit (INDEPENDENT_AMBULATORY_CARE_PROVIDER_SITE_OTHER): Admitting: Surgery

## 2024-06-04 ENCOUNTER — Ambulatory Visit: Payer: Self-pay | Admitting: Nurse Practitioner

## 2024-06-04 ENCOUNTER — Encounter: Payer: Self-pay | Admitting: Surgery

## 2024-06-04 VITALS — BP 116/79 | HR 75 | Temp 98.0°F | Resp 16 | Ht 69.0 in | Wt 241.0 lb

## 2024-06-04 DIAGNOSIS — Z09 Encounter for follow-up examination after completed treatment for conditions other than malignant neoplasm: Secondary | ICD-10-CM

## 2024-06-04 DIAGNOSIS — L02211 Cutaneous abscess of abdominal wall: Secondary | ICD-10-CM

## 2024-06-04 LAB — WOUND CULTURE: MICRO NUMBER:: 16619753

## 2024-06-04 MED ORDER — LEVOFLOXACIN 500 MG PO TABS: 500.0000 mg | ORAL_TABLET | Freq: Every day | ORAL | 0 refills | Status: DC

## 2024-06-04 NOTE — Progress Notes (Signed)
 Patient seen in office for dressing and packing change to abdominal wall wound.    Packing removed. Wound cleansed with Normal Saline. Slight bleeding noted.    Approximately 10 of Iodoform 1/2 inch gauze packed into wound and Mepilex 4 x 4 bandage applied.    Preliminary culture results show moderate gram negative bacilli/  heavy growth of pseudomonas aeruginosa. Dr. Evonnie reviewed and recommended beginning Levaquin  as Bactrim  does not cover pseudomonas. Recommended also finishing Bactrim  as culture has not completed and other organisms might be present. Patient is aware and agreeable to plan. Advised to add OTC probiotics as patient has multiple ABTx in short period of time.   Patient is scheduled for daily dressing changes until follow up with provider on 06/10/2024, though patient will not change packing over the weekend.

## 2024-06-07 ENCOUNTER — Ambulatory Visit (INDEPENDENT_AMBULATORY_CARE_PROVIDER_SITE_OTHER): Admitting: Surgery

## 2024-06-07 ENCOUNTER — Encounter: Payer: Self-pay | Admitting: Surgery

## 2024-06-07 ENCOUNTER — Other Ambulatory Visit: Payer: Self-pay

## 2024-06-07 VITALS — BP 116/80 | HR 78 | Temp 97.7°F | Resp 16 | Ht 69.0 in | Wt 241.0 lb

## 2024-06-07 DIAGNOSIS — L02211 Cutaneous abscess of abdominal wall: Secondary | ICD-10-CM

## 2024-06-07 DIAGNOSIS — Z09 Encounter for follow-up examination after completed treatment for conditions other than malignant neoplasm: Secondary | ICD-10-CM

## 2024-06-07 MED ORDER — CIPROFLOXACIN HCL 500 MG PO TABS: 500.0000 mg | ORAL_TABLET | Freq: Two times a day (BID) | ORAL | 0 refills | Status: AC

## 2024-06-07 NOTE — Progress Notes (Signed)
 Patient seen in office for dressing and packing change to abdominal wall wound.    Packing removed. Wound cleansed with Normal Saline. Slight bleeding noted.    Approximately 10 of Iodoform 1/2 inch gauze packed into wound and Mepilex 4 x 4 bandage applied.    Final culture results show moderate gram negative bacilli/  heavy growth of pseudomonas aeruginosa, sensitive to Cipro . Dr. Evonnie made aware and new orders obtained to stop Levaquin . Begin Cipro  500 mg PO BID x7 days.   Prescription sent to pharmacy. Patient made aware and agreeable to plan. Patient to continue with OTC probiotics as well.    Patient is scheduled for daily dressing changes until follow up with provider on 06/10/2024.

## 2024-06-08 ENCOUNTER — Encounter: Payer: Self-pay | Admitting: Surgery

## 2024-06-08 ENCOUNTER — Other Ambulatory Visit: Payer: Self-pay

## 2024-06-08 ENCOUNTER — Ambulatory Visit (INDEPENDENT_AMBULATORY_CARE_PROVIDER_SITE_OTHER): Admitting: Surgery

## 2024-06-08 VITALS — BP 112/74 | HR 79 | Temp 98.2°F | Resp 16 | Ht 69.0 in | Wt 241.0 lb

## 2024-06-08 DIAGNOSIS — L02211 Cutaneous abscess of abdominal wall: Secondary | ICD-10-CM

## 2024-06-08 DIAGNOSIS — Z09 Encounter for follow-up examination after completed treatment for conditions other than malignant neoplasm: Secondary | ICD-10-CM

## 2024-06-08 NOTE — Progress Notes (Signed)
 Patient seen in office for dressing and packing change to abdominal wall wound.    Packing removed. Wound cleansed with Normal Saline. Slight bleeding noted.    Approximately 10 of Iodoform 1/2 inch gauze packed into wound and Mepilex 4 x 4 bandage applied.    Patient doing well with Cipro . No C/O pain, N/V, etc.    Patient is scheduled for daily dressing changes until follow up with provider on 06/10/2024.

## 2024-06-09 ENCOUNTER — Ambulatory Visit (INDEPENDENT_AMBULATORY_CARE_PROVIDER_SITE_OTHER): Admitting: Surgery

## 2024-06-09 ENCOUNTER — Other Ambulatory Visit: Payer: Self-pay

## 2024-06-09 VITALS — BP 114/75 | HR 82 | Temp 98.2°F | Resp 16 | Ht 69.0 in | Wt 239.0 lb

## 2024-06-09 DIAGNOSIS — Z09 Encounter for follow-up examination after completed treatment for conditions other than malignant neoplasm: Secondary | ICD-10-CM

## 2024-06-09 DIAGNOSIS — L02211 Cutaneous abscess of abdominal wall: Secondary | ICD-10-CM

## 2024-06-09 NOTE — Progress Notes (Signed)
 Patient seen in office for dressing and packing change to abdominal wall wound.    Packing removed. Wound cleansed with Normal Saline. Slight bleeding noted.    Approximately 10 of Iodoform 1/2 inch gauze packed into wound and Mepilex 4 x 4 bandage applied.    Patient doing well with Cipro . No C/O pain, N/V, etc.    Patient is scheduled for daily dressing changes until follow up with provider on 06/10/2024.

## 2024-06-10 ENCOUNTER — Ambulatory Visit (INDEPENDENT_AMBULATORY_CARE_PROVIDER_SITE_OTHER): Admitting: Surgery

## 2024-06-10 ENCOUNTER — Encounter: Payer: Self-pay | Admitting: Surgery

## 2024-06-10 VITALS — BP 127/81 | HR 78 | Temp 98.2°F | Resp 16 | Ht 69.0 in | Wt 241.0 lb

## 2024-06-10 DIAGNOSIS — L02211 Cutaneous abscess of abdominal wall: Secondary | ICD-10-CM | POA: Diagnosis not present

## 2024-06-14 ENCOUNTER — Ambulatory Visit: Admitting: General Surgery

## 2024-06-14 VITALS — BP 113/72 | HR 76 | Temp 97.9°F | Resp 18 | Ht 69.0 in | Wt 241.0 lb

## 2024-06-14 DIAGNOSIS — Z09 Encounter for follow-up examination after completed treatment for conditions other than malignant neoplasm: Secondary | ICD-10-CM | POA: Diagnosis not present

## 2024-06-14 DIAGNOSIS — L02211 Cutaneous abscess of abdominal wall: Secondary | ICD-10-CM | POA: Diagnosis not present

## 2024-06-14 NOTE — Progress Notes (Signed)
 [facility] Note   HPI:  53 y.o. Female presents to clinic for follow-up status post left abdominal abscess I&D in office on 5/29.  Patient has been undergoing 3-4 dressing changes weekly.  Her antibiotic was switched from ciprofloxacin  to Levaquin  once susceptibilities came back on her wound culture demonstrating Pseudomonas with intermediate resistance to ciprofloxacin .  Overall she has been doing well.  She denies significant pain associated with the area.  She denies fevers and chills.  She denies any further purulent drainage from the I&D site.  Review of Systems:  All other review of systems: otherwise negative   Vital Signs:  BP 127/81   Pulse 78   Temp 98.2 F (36.8 C) (Oral)   Resp 16   Ht 5' 9 (1.753 m)   Wt 241 lb (109.3 kg)   SpO2 97%   BMI 35.59 kg/m    Physical Exam:  Physical Exam Vitals reviewed.  Constitutional:      Appearance: Normal appearance.  Abdominal:     Comments: Abdomen soft, nondistended, no percussion tenderness, nontender to palpation; no guarding, rigidity, rebound tenderness; left upper quadrant I&D site with pink granulation tissue at the base, improving induration at the lateral aspect without purulent drainage noted  Neurological:     Mental Status: She is alert.     Laboratory studies: None  Imaging:  None  Assessment:  53 y.o. yo Female who presents for follow-up status post left abdominal abscess I&D in office on 5/29.  Plan:  - Patient overall doing well from her abdominal wall abscess I&D - Patient's continuing to take ciprofloxacin , advised to take it until prescription completed - No further evidence of recurrent abscess in this location -Continue with packing changes of half-inch iodoform.  Discussed that if the area heals up enough that packing is unable to be placed, then we will switch her to just dry gauze dressings - We will return to 3 times a week dressing changes with my office nurse, and she will  follow-up with me in 2 weeks  All of the above recommendations were discussed with the patient, and all of patient's questions were answered to her expressed satisfaction.  Note: Portions of this report may have been transcribed using voice recognition software. Every effort has been made to ensure accuracy; however, inadvertent computerized transcription errors may still be present.   Dorothyann Brittle, DO [facility] [address] [place], [place] [address] [phone] (office)

## 2024-06-14 NOTE — Progress Notes (Signed)
 Patient seen in office for dressing and packing change to abdominal wall wound.    Packing removed. Wound cleansed with Normal Saline. Moderate bleeding noted with some purulent drainage expressed from deep incision to left side with slight pressure.    Approximately 10 of Iodoform 1/2 inch gauze packed into wound and Mepilex 4 x 4 bandage applied.    Patient has completed Cipro . No C/O pain, N/V, etc.    Patient is scheduled for 3x weekly dressing changes.   Attest:  Patient seen in office for wound check by medical assistant.

## 2024-06-15 ENCOUNTER — Encounter: Payer: Self-pay | Admitting: General Surgery

## 2024-06-15 ENCOUNTER — Telehealth: Payer: Self-pay | Admitting: Nurse Practitioner

## 2024-06-15 DIAGNOSIS — I421 Obstructive hypertrophic cardiomyopathy: Secondary | ICD-10-CM

## 2024-06-15 NOTE — Telephone Encounter (Signed)
 Patient informed and verbalized understanding of plan. Referral placed.

## 2024-06-15 NOTE — Telephone Encounter (Signed)
-----   Message from Laymon CHRISTELLA Qua sent at 06/14/2024  8:56 PM EDT ----- Regarding: Referral to [facility] Covering Elizabeth's inbox. Please let the patient know we heard back from Dr. Mallipeddi and she was in agreement to refer to the [facility] (Dr. Santo) in [place]. Please enter the referral.   Thanks,  [place] ----- Message ----- From: Stacia Diannah SQUIBB, MD Sent: 06/14/2024   3:18 PM EDT To: Almarie Crate, NP  Yes okay with the referral if patient is requesting it. ----- Message ----- From: Crate Almarie, NP Sent: 04/30/2024   9:09 AM EDT To: Vishnu P Mallipeddi, MD  Wanted to see if she is okay from your standpoint for pending surgery? Went over results of limited Echo and monitor with her. Did discuss medication titration, and due to past symptoms, came to decision with patient that we will hold off at this time.  Did discuss HCM clinic, and patient seemed interested in this.  Wanted to see if you are okay with this referral?  Thanks so much!   Best, Almarie Crate, NP

## 2024-06-16 ENCOUNTER — Encounter: Payer: Self-pay | Admitting: General Surgery

## 2024-06-16 ENCOUNTER — Ambulatory Visit: Admitting: General Surgery

## 2024-06-16 ENCOUNTER — Other Ambulatory Visit: Payer: Self-pay

## 2024-06-16 VITALS — BP 121/74 | HR 85 | Temp 98.3°F | Resp 16 | Ht 69.0 in | Wt 244.0 lb

## 2024-06-16 DIAGNOSIS — L02211 Cutaneous abscess of abdominal wall: Secondary | ICD-10-CM

## 2024-06-16 DIAGNOSIS — Z09 Encounter for follow-up examination after completed treatment for conditions other than malignant neoplasm: Secondary | ICD-10-CM

## 2024-06-16 NOTE — Progress Notes (Signed)
 Patient seen in office for dressing and packing change to abdominal wall wound.    Packing removed. Wound cleansed with Normal Saline. Moderate bright red bleeding noted. No further purulent drainage noted or able to be expressed.   Approximately 9 of Iodoform 1/2 inch gauze packed into wound and Mepilex 4 x 4 bandage applied.     Patient is scheduled for 3x weekly dressing changes.

## 2024-06-17 ENCOUNTER — Ambulatory Visit: Admitting: Internal Medicine

## 2024-06-17 ENCOUNTER — Encounter: Payer: Self-pay | Admitting: Internal Medicine

## 2024-06-17 VITALS — BP 114/76 | HR 76 | Ht 69.0 in | Wt 244.0 lb

## 2024-06-17 DIAGNOSIS — E65 Localized adiposity: Secondary | ICD-10-CM | POA: Diagnosis not present

## 2024-06-17 DIAGNOSIS — N1831 Chronic kidney disease, stage 3a: Secondary | ICD-10-CM

## 2024-06-17 DIAGNOSIS — I10 Essential (primary) hypertension: Secondary | ICD-10-CM | POA: Diagnosis not present

## 2024-06-17 DIAGNOSIS — F3341 Major depressive disorder, recurrent, in partial remission: Secondary | ICD-10-CM | POA: Diagnosis not present

## 2024-06-17 DIAGNOSIS — L02211 Cutaneous abscess of abdominal wall: Secondary | ICD-10-CM | POA: Diagnosis not present

## 2024-06-17 DIAGNOSIS — E1169 Type 2 diabetes mellitus with other specified complication: Secondary | ICD-10-CM

## 2024-06-17 DIAGNOSIS — Z7985 Long-term (current) use of injectable non-insulin antidiabetic drugs: Secondary | ICD-10-CM

## 2024-06-17 DIAGNOSIS — E039 Hypothyroidism, unspecified: Secondary | ICD-10-CM

## 2024-06-17 MED ORDER — METOPROLOL TARTRATE 50 MG PO TABS: 50.0000 mg | ORAL_TABLET | Freq: Two times a day (BID) | ORAL | 3 refills | Status: AC

## 2024-06-17 NOTE — Assessment & Plan Note (Signed)
 Lab Results  Component Value Date   HGBA1C 5.2 02/16/2024   Well controlled now, was 11.7 in 2021 Associated with HTN, CHF and HLD On Mounjaro  15 mg qw and Jardiance  10 mg QD  Home blood glucose readings reviewed -mostly between 90-120 Advised to follow diabetic diet On statin and ACEi F/u CMP and lipid panel Diabetic eye exam: Advised to follow up with Ophthalmology for diabetic eye exam

## 2024-06-17 NOTE — Progress Notes (Signed)
 Patient seen in office by medical assistant for wound care.

## 2024-06-17 NOTE — Assessment & Plan Note (Signed)
 Lab Results  Component Value Date   TSH 1.950 02/16/2024   On levothyroxine  112 mcg daily now Had elevated free T4 in the last visit, Levothyroxine  dose was reduced -  recheck TSH and free T4

## 2024-06-17 NOTE — Assessment & Plan Note (Signed)
 BP Readings from Last 1 Encounters:  06/17/24 114/76   Well-controlled with Metoprolol  and Verapamil  Lisinopril  was recently discontinued by cardiology Counseled for compliance with the medications Advised DASH diet and moderate exercise/walking, at least 150 mins/week

## 2024-06-17 NOTE — Patient Instructions (Signed)
 Please continue to take medications as prescribed.  Please continue to follow low carb diet and perform moderate exercise/walking at least 150 mins/week.  Please hold Mounjaro  1 week prior to your surgery.

## 2024-06-17 NOTE — Assessment & Plan Note (Signed)
 Well controlled with bupropion 300 mg QD

## 2024-06-17 NOTE — Assessment & Plan Note (Signed)
 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

## 2024-06-17 NOTE — Assessment & Plan Note (Signed)
 Last CMP reviewed, GFR stable around 45 Avoid nephrotoxic agents On lisinopril  and Jardiance  Recheck CMP

## 2024-06-17 NOTE — Progress Notes (Signed)
 Patient seen by Medical Assistant for wound care.

## 2024-06-17 NOTE — Progress Notes (Signed)
 Established Patient Office Visit  Subjective:  Patient ID: Erin Sexton, female    DOB: [birth year]  Age: 53 y.o. MRN: [id number]  CC:  Chief Complaint  Patient presents with   Medical Management of Chronic Issues    4 month f/u.     HPI Erin Sexton is a 53 y.o. female with past medical history of HTN, type II DM, hypothyroidism, HLD, OSA, asthma and morbid obesity who presents for f/u of her chronic medical conditions.  Type II DM: Her blood glucose has been better controlled with Mounjaro  15 mg qw now. She also takes Jardiance  10 mg once daily for HOCM/CHF.  Denies any polyuria or polydipsia currently.  She also takes statin for HLD. She has lost total 27 lbs with Mounjaro . She had lost about 30 lbs with Trulicity .  She is also doing intermittent fasting.  CKD: Her last CMP showed GFR of 44 in 05/25.  She denies any dysuria, hematuria or urinary hesitance or resistance.  Migraine: She had neurology evaluation for migraine.  She has been taking Topamax  and as needed Nurtec.  Her headache frequency and intensity have improved now.  HTN and HOCM: BP is well-controlled. Takes medications regularly. Patient denies headache, dizziness, chest pain, or palpitations.  She follows up with cardiology for HOCM.  She has chronic leg swelling and exertional dyspnea, but have improved with weight loss.   Hypothyroidism: She has been taking Levothyroxine  112 mcg QD.  She has noticed slowing of weight loss since decreasing levothyroxine  dose.  Denies any recent change in appetite, tremors or palpitations.  She has abdominal skin sagging due to weight loss.  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  She had plastic surgery consultation for it. She is planned to get panniculectomy due to panniculus related skin infections. She is currently using Lotrisone  cream for intertrigo.  She had I&D for abdominal wall abscess in 05/25. She is undergoing wound care with [facility]  general surgery.  She recently completed ciprofloxacin  for wound infection, which showed Pseudomonas aeruginosa on culture.  Denies any recent episode of fever or chills.  Past Medical History:  Diagnosis Date   Abnormal CT scan    a. 02/2014: evaluated for dyspnea and initially placed on Xarelto  for PE because radiologist could not exclude small peripheral pulmonary emboli because of limited contrast. However, after admission for ++vaginal bleeding several days later, it was felt she did NOT have PE - underwent further testing with neg VQ 03/06/14, neg LE duplex.   Anemia    Anxiety    Arthritis    Asthma    Blood transfusion without reported diagnosis    Bone spur of right foot    CHF (congestive heart failure) (HCC)    COPD (chronic obstructive pulmonary disease) (HCC)    Depression    Diabetes mellitus without complication (HCC)    a. Dx 02/2014.   Heart murmur    History of echocardiogram 2015   normal EF, Grade II diastolic dysfunction   HOCM (hypertrophic obstructive cardiomyopathy) (HCC)    a. Dx 67984.   Hyperlipidemia    Hypertension    Morbid obesity (HCC)    NSVT (nonsustained ventricular tachycardia) (HCC)    a. Noted on tele during 02/2014 adm for symptomatic anemia.   Stroke [facility])    SVT (supraventricular tachycardia) (HCC)    a. Noted on tele during 02/2014 adm for symptomatic anemia.   Thyroid  disease    Vaginal bleeding  Past Surgical History:  Procedure Laterality Date   COLONOSCOPY N/A 09/30/2019   Procedure: COLONOSCOPY;  Surgeon: Golda Claudis PENNER, MD;  Location: [facility] ENDO SUITE;  Service: Endoscopy;  Laterality: N/A;  730   DILITATION & CURRETTAGE/HYSTROSCOPY WITH NOVASURE ABLATION N/A 08/07/2016   Procedure: DILATATION & CURETTAGE/HYSTEROSCOPY WITH ATTPEMPTED NOVASURE ABLATION AND INSERTION OF IUD;  Surgeon: Vonn VEAR Inch, MD;  Location: [facility] ORS;  Service: Gynecology;  Laterality: N/A;    Family History  Problem Relation Age of Onset   Diabetes Mother     Arthritis Mother    Other Mother        WPW   Mental illness Mother        Depression   Asthma Mother    Obesity Mother    Heart disease Father        History of coronary bypass grafting at age 41   Hypertension Father    Mental illness Sister    Bipolar disorder Sister    Heart disease Sister        possibly WPW   Depression Sister    Varicose Veins Sister    Uterine cancer Maternal Grandmother    Mental illness Maternal Grandmother    Arthritis Maternal Grandmother    Hyperlipidemia Maternal Grandmother    Heart disease Maternal Grandfather    CAD Neg Hx        No family history of EARLY CAD    Social History   Socioeconomic History   Marital status: Divorced    Spouse name: Not on file   Number of children: 0   Years of education: Not on file   Highest education level: Associate degree: academic program  Occupational History   Occupation: Disabled  Tobacco Use   Smoking status: Never   Smokeless tobacco: Never  Vaping Use   Vaping status: Never Used  Substance and Sexual Activity   Alcohol use: No   Drug use: No   Sexual activity: Not Currently    Birth control/protection: I.U.D., Post-menopausal    Comment: IUD in place to control vaginal bleeding  Other Topics Concern   Not on file  Social History Narrative   Divorced for 3 years,married for 5 years.On disability since 2009 secondary to mental illness.   Social Drivers of Corporate investment banker Strain: Low Risk  (06/13/2024)   Overall Financial Resource Strain (CARDIA)    Difficulty of Paying Living Expenses: Not very hard  Food Insecurity: No Food Insecurity (06/13/2024)   Hunger Vital Sign    Worried About Running Out of Food in the Last Year: Never true    Ran Out of Food in the Last Year: Never true  Transportation Needs: No Transportation Needs (06/13/2024)   PRAPARE - Administrator, Civil Service (Medical): No    Lack of Transportation (Non-Medical): No  Physical Activity:  Insufficiently Active (06/13/2024)   Exercise Vital Sign    Days of Exercise per Week: 4 days    Minutes of Exercise per Session: 30 min  Stress: Stress Concern Present (06/13/2024)   [employer] of Occupational Health - Occupational Stress Questionnaire    Feeling of Stress: To some extent  Social Connections: Socially Isolated (06/13/2024)   Social Connection and Isolation Panel    Frequency of Communication with Friends and Family: More than three times a week    Frequency of Social Gatherings with Friends and Family: Patient declined    Attends Religious Services: Never    Active Member  of Clubs or Organizations: No    Attends Banker Meetings: Not on file    Marital Status: Divorced  Intimate Partner Violence: Not At Risk (10/01/2023)   Humiliation, Afraid, Rape, and Kick questionnaire    Fear of Current or Ex-Partner: No    Emotionally Abused: No    Physically Abused: No    Sexually Abused: No    Outpatient Medications Prior to Visit  Medication Sig Dispense Refill   ACCU-CHEK GUIDE TEST test strip USE 1 STRIP TO CHECK GLUCOSE ONCE DAILY 100 each 0   Accu-Chek Softclix Lancets lancets USE 1 LANCET TO CHECK GLUCOSE ONCE DAILY 100 each 3   albuterol  (PROVENTIL ) (5 MG/ML) 0.5% nebulizer solution Take 0.5 mLs (2.5 mg total) by nebulization every 6 (six) hours as needed for wheezing or shortness of breath. 20 mL 0   albuterol  (VENTOLIN  HFA) 108 (90 Base) MCG/ACT inhaler Inhale 2 puffs into the lungs as needed (wheezing).      atorvastatin  (LIPITOR) 80 MG tablet Take 1 tablet (80 mg total) by mouth daily. 90 tablet 3   blood glucose meter kit and supplies KIT Dispense based on patient and insurance preference. Use once a day. Diagnosis code:E11.9 1 each 0   buPROPion  (WELLBUTRIN  XL) 300 MG 24 hr tablet Take 1 tablet (300 mg total) by mouth daily. 90 tablet 3   Cholecalciferol (VITAMIN D3 PO) Take 10,000 Units by mouth daily.     clindamycin (CLEOCIN T) 1 % lotion       clotrimazole -betamethasone  (LOTRISONE ) cream APPLY  CREAM TOPICALLY TO AFFECTED AREA TWICE DAILY 30 g 2   empagliflozin  (JARDIANCE ) 10 MG TABS tablet Take 1 tablet (10 mg total) by mouth daily. 90 tablet 3   estradiol  (ESTRACE ) 2 MG tablet TAKE 1 TABLET BY MOUTH AT BEDTIME 90 tablet 3   ibuprofen  (ADVIL ) 600 MG tablet Take 1 tablet (600 mg total) by mouth every 8 (eight) hours as needed. 30 tablet 0   levocetirizine (XYZAL ) 5 MG tablet Take 1 tablet (5 mg total) by mouth every evening. 90 tablet 3   levonorgestrel  (MIRENA ) 20 MCG/24HR IUD 1 each by Intrauterine route once.     levothyroxine  (SYNTHROID ) 112 MCG tablet Take 1 tablet (112 mcg total) by mouth daily. 90 tablet 1   megestrol  (MEGACE ) 40 MG tablet Take 1 tablet (40 mg total) by mouth daily. 30 tablet 11   nystatin  (MYCOSTATIN /NYSTOP ) powder Apply 1 Application topically 3 (three) times daily. 60 g 0   Rimegepant Sulfate (NURTEC) 75 MG TBDP Take 1 tablet (75 mg total) by mouth as needed. 8 tablet 11   SYMBICORT  160-4.5 MCG/ACT inhaler Inhale 2 puffs into the lungs as needed. 1 each 2   tirzepatide  (MOUNJARO ) 15 MG/0.5ML Pen Inject 15 mg into the skin once a week. 6 mL 1   topiramate  (TOPAMAX ) 100 MG tablet Take 1 tablet (100 mg total) by mouth daily. 90 tablet 3   verapamil  (CALAN -SR) 120 MG CR tablet Take 1 tablet (120 mg total) by mouth at bedtime. 90 tablet 1   LORazepam  (ATIVAN ) 0.5 MG tablet Take 1 tablet 30 minutes prior to procedure. Please take additional tablet for persistent anxiety. 2 tablet 0   metoprolol  tartrate (LOPRESSOR ) 50 MG tablet Take 1 tablet by mouth twice daily 180 tablet 0   No facility-administered medications prior to visit.    No Known Allergies  ROS Review of Systems  Constitutional:  Negative for chills and fever.  HENT:  Negative for congestion, sinus  pressure, sinus pain and sore throat.   Eyes:  Negative for pain and discharge.  Respiratory:  Negative for cough and shortness of breath.    Cardiovascular:  Positive for leg swelling. Negative for chest pain and palpitations.  Gastrointestinal:  Negative for abdominal pain, diarrhea, nausea and vomiting.  Endocrine: Negative for polydipsia and polyuria.  Genitourinary:  Negative for dysuria and hematuria.  Musculoskeletal:  Positive for arthralgias. Negative for neck pain and neck stiffness.  Skin:  Negative for rash.  Neurological:  Negative for dizziness and weakness.  Psychiatric/Behavioral:  Negative for agitation and behavioral problems.       Objective:    Physical Exam Vitals reviewed.  Constitutional:      General: She is not in acute distress.    Appearance: She is obese. She is not diaphoretic.  HENT:     Head: Normocephalic and atraumatic.     Nose: Nose normal. No congestion.     Mouth/Throat:     Mouth: Mucous membranes are moist.     Pharynx: No posterior oropharyngeal erythema.  Eyes:     General: No scleral icterus.    Extraocular Movements: Extraocular movements intact.  Cardiovascular:     Rate and Rhythm: Normal rate and regular rhythm.     Heart sounds: Normal heart sounds. No murmur heard. Pulmonary:     Breath sounds: Normal breath sounds. No wheezing or rales.  Abdominal:     Palpations: Abdomen is soft.     Tenderness: There is no abdominal tenderness.     Hernia: A hernia (Umbilical) is present.     Comments: Abdominal skin sagging - panniculus Dressing in place over left upper quadrant area  Musculoskeletal:     Cervical back: Neck supple. No tenderness.     Right lower leg: No edema.     Left lower leg: No edema.  Skin:    General: Skin is warm.     Findings: No rash.     Comments: Varicose veins on LE bilaterally  Neurological:     General: No focal deficit present.     Mental Status: She is alert and oriented to person, place, and time.  Psychiatric:        Mood and Affect: Mood normal.        Behavior: Behavior normal.     BP 114/76   Pulse 76   Ht 5' 9 (1.753 m)    Wt 244 lb (110.7 kg)   SpO2 98%   BMI 36.03 kg/m  Wt Readings from Last 3 Encounters:  06/17/24 244 lb (110.7 kg)  06/16/24 244 lb (110.7 kg)  06/14/24 241 lb (109.3 kg)    Lab Results  Component Value Date   TSH 1.950 02/16/2024   Lab Results  Component Value Date   WBC 9.4 04/26/2024   HGB 16.7 (H) 04/26/2024   HCT 48.6 (H) 04/26/2024   MCV 96 04/26/2024   PLT 286 04/26/2024   Lab Results  Component Value Date   NA 143 04/26/2024   K 4.4 04/26/2024   CO2 18 (L) 04/26/2024   GLUCOSE 107 (H) 04/26/2024   BUN 22 04/26/2024   CREATININE 1.42 (H) 04/26/2024   BILITOT 0.5 04/26/2024   ALKPHOS 99 04/26/2024   AST 22 04/26/2024   ALT 36 (H) 04/26/2024   PROT 6.1 04/26/2024   ALBUMIN  4.0 04/26/2024   CALCIUM  9.2 04/26/2024   ANIONGAP 7 07/21/2022   EGFR 44 (L) 04/26/2024   Lab Results  Component Value Date  CHOL 95 (L) 06/16/2023   Lab Results  Component Value Date   HDL 34 (L) 06/16/2023   Lab Results  Component Value Date   LDLCALC 45 06/16/2023   Lab Results  Component Value Date   TRIG 78 06/16/2023   Lab Results  Component Value Date   CHOLHDL 2.8 06/16/2023   Lab Results  Component Value Date   HGBA1C 5.2 02/16/2024      Assessment & Plan:   Problem List Items Addressed This Visit       Cardiovascular and Mediastinum   Essential hypertension, benign (Chronic)   BP Readings from Last 1 Encounters:  06/17/24 114/76   Well-controlled with Metoprolol  and Verapamil  Lisinopril  was recently discontinued by cardiology Counseled for compliance with the medications Advised DASH diet and moderate exercise/walking, at least 150 mins/week      Relevant Medications   metoprolol  tartrate (LOPRESSOR ) 50 MG tablet     Endocrine   Type 2 diabetes mellitus with other specified complication (HCC) - Primary   Lab Results  Component Value Date   HGBA1C 5.2 02/16/2024   Well controlled now, was 11.7 in 2021 Associated with HTN, CHF and HLD On  Mounjaro  15 mg qw and Jardiance  10 mg QD  Home blood glucose readings reviewed -mostly between 90-120 Advised to follow diabetic diet On statin and ACEi F/u CMP and lipid panel Diabetic eye exam: Advised to follow up with Ophthalmology for diabetic eye exam      Relevant Orders   Hemoglobin A1c   CMP14+EGFR   Hypothyroidism   Lab Results  Component Value Date   TSH 1.950 02/16/2024   On levothyroxine  112 mcg daily now Had elevated free T4 in the last visit, Levothyroxine  dose was reduced -  recheck TSH and free T4      Relevant Medications   metoprolol  tartrate (LOPRESSOR ) 50 MG tablet   Other Relevant Orders   TSH + free T4     Genitourinary   Stage 3a chronic kidney disease (HCC)   Last CMP reviewed, GFR stable around 45 Avoid nephrotoxic agents On lisinopril  and Jardiance  Recheck CMP        Other   Recurrent major depressive disorder, in partial remission (HCC)   Well controlled with bupropion  300 mg QD      Abdominal panniculus, symptomatic   Has 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      Abdominal wall abscess   S/p I&D in 05/25 Undergoing wound care with [facility] general surgery Recently completed oral ciprofloxacin  for Pseudomonas aeruginosa infection       Meds ordered this encounter  Medications   metoprolol  tartrate (LOPRESSOR ) 50 MG tablet    Sig: Take 1 tablet (50 mg total) by mouth 2 (two) times daily.    Dispense:  180 tablet    Refill:  3    Follow-up: Return in about 4 months (around 10/18/2024).    Suzzane MARLA Blanch, MD

## 2024-06-17 NOTE — Assessment & Plan Note (Signed)
 S/p I&D in 05/25 Undergoing wound care with [facility] general surgery Recently completed oral ciprofloxacin  for Pseudomonas aeruginosa infection

## 2024-06-18 ENCOUNTER — Encounter: Payer: Self-pay | Admitting: General Surgery

## 2024-06-18 ENCOUNTER — Ambulatory Visit (INDEPENDENT_AMBULATORY_CARE_PROVIDER_SITE_OTHER): Admitting: General Surgery

## 2024-06-18 ENCOUNTER — Ambulatory Visit: Payer: Self-pay | Admitting: Internal Medicine

## 2024-06-18 ENCOUNTER — Other Ambulatory Visit: Payer: Self-pay

## 2024-06-18 VITALS — BP 133/82 | HR 78 | Temp 98.1°F | Resp 18 | Ht 69.0 in | Wt 241.0 lb

## 2024-06-18 DIAGNOSIS — Z09 Encounter for follow-up examination after completed treatment for conditions other than malignant neoplasm: Secondary | ICD-10-CM

## 2024-06-18 DIAGNOSIS — L02211 Cutaneous abscess of abdominal wall: Secondary | ICD-10-CM

## 2024-06-18 LAB — CMP14+EGFR
ALT: 45 IU/L — ABNORMAL HIGH (ref 0–32)
AST: 34 IU/L (ref 0–40)
Albumin: 4.1 g/dL (ref 3.8–4.9)
Alkaline Phosphatase: 98 IU/L (ref 44–121)
BUN/Creatinine Ratio: 17 (ref 9–23)
BUN: 21 mg/dL (ref 6–24)
Bilirubin Total: 0.7 mg/dL (ref 0.0–1.2)
CO2: 18 mmol/L — ABNORMAL LOW (ref 20–29)
Calcium: 9.1 mg/dL (ref 8.7–10.2)
Chloride: 111 mmol/L — ABNORMAL HIGH (ref 96–106)
Creatinine, Ser: 1.24 mg/dL — ABNORMAL HIGH (ref 0.57–1.00)
Globulin, Total: 2 g/dL (ref 1.5–4.5)
Glucose: 75 mg/dL (ref 70–99)
Potassium: 4.2 mmol/L (ref 3.5–5.2)
Sodium: 142 mmol/L (ref 134–144)
Total Protein: 6.1 g/dL (ref 6.0–8.5)
eGFR: 51 mL/min/1.73 — ABNORMAL LOW (ref >59)

## 2024-06-18 LAB — HEMOGLOBIN A1C
Est. average glucose Bld gHb Est-mCnc: 91 mg/dL
Hgb A1c MFr Bld: 4.8 % (ref 4.8–5.6)

## 2024-06-18 LAB — TSH+FREE T4
Free T4: 2.11 ng/dL — ABNORMAL HIGH (ref 0.82–1.77)
TSH: 2.96 u[IU]/mL (ref 0.450–4.500)

## 2024-06-18 NOTE — Progress Notes (Signed)
 Patient seen by medical assistant.  Agree with evaluation and treatment plan.

## 2024-06-18 NOTE — Progress Notes (Signed)
 Patient seen in office for dressing and packing change to abdominal wall wound.    Packing removed. Wound cleansed with Normal Saline. Minimal bright red bleeding noted. No further purulent drainage noted or able to be expressed.    Approximately 9 of Iodoform 1/2 inch gauze packed into wound and Mepilex 4 x 4 bandage applied.     Patient is scheduled for 3x weekly dressing changes. Follow up with Dr. Evonnie on Wednesday 06/23/2024.

## 2024-06-21 ENCOUNTER — Encounter: Payer: Self-pay | Admitting: Surgery

## 2024-06-21 ENCOUNTER — Ambulatory Visit (INDEPENDENT_AMBULATORY_CARE_PROVIDER_SITE_OTHER): Admitting: Surgery

## 2024-06-21 ENCOUNTER — Other Ambulatory Visit: Payer: Self-pay

## 2024-06-21 VITALS — BP 127/82 | HR 83 | Temp 98.4°F | Resp 16

## 2024-06-21 DIAGNOSIS — L02211 Cutaneous abscess of abdominal wall: Secondary | ICD-10-CM

## 2024-06-21 DIAGNOSIS — Z09 Encounter for follow-up examination after completed treatment for conditions other than malignant neoplasm: Secondary | ICD-10-CM

## 2024-06-21 NOTE — Progress Notes (Signed)
 Patient seen in office for dressing and packing change to abdominal wall wound.    Packing removed. Wound cleansed with Normal Saline. Moderate bright red bleeding noted. No further purulent drainage noted or able to be expressed.    Approximately 7 of Iodoform 1/2 inch gauze packed into wound and Mepilex 4 x 4 bandage applied.     Patient is scheduled for 3x weekly dressing changes. Follow up with Dr. Evonnie on Wednesday 06/23/2024.

## 2024-06-23 ENCOUNTER — Ambulatory Visit (INDEPENDENT_AMBULATORY_CARE_PROVIDER_SITE_OTHER): Admitting: Surgery

## 2024-06-23 ENCOUNTER — Encounter: Payer: Self-pay | Admitting: Surgery

## 2024-06-23 VITALS — BP 129/77 | HR 79 | Temp 98.3°F | Resp 12 | Ht 69.0 in | Wt 244.0 lb

## 2024-06-23 DIAGNOSIS — L02211 Cutaneous abscess of abdominal wall: Secondary | ICD-10-CM

## 2024-06-25 ENCOUNTER — Ambulatory Visit (INDEPENDENT_AMBULATORY_CARE_PROVIDER_SITE_OTHER): Admitting: Surgery

## 2024-06-25 VITALS — BP 126/81 | HR 81 | Temp 98.2°F | Resp 12 | Ht 69.0 in | Wt 244.0 lb

## 2024-06-25 DIAGNOSIS — L02211 Cutaneous abscess of abdominal wall: Secondary | ICD-10-CM

## 2024-06-25 NOTE — Progress Notes (Signed)
 Patient seen in office for dressing and packing change to abdominal wall wound.    Packing removed. Wound cleansed with Normal Saline. Small amount of bright red bleeding noted. No further purulent drainage noted or able to be expressed.    Approximately 6.5 of Iodoform 1/2 inch gauze packed into wound and Mepilex 4 x 4 bandage applied.     Patient is scheduled for 3x weekly dressing changes.

## 2024-06-25 NOTE — Progress Notes (Signed)
 [facility] Note   HPI:  53 y.o. Female presents to clinic for follow-up status post left abdominal abscess incision and drainage on 5/29.  She is undergoing 3 times a week dressing changes.  She has noted no significant issues at the site, and believes that the amount of packing going into the incision every time is decreasing.  She denies any further painful areas around the I&D site.  She denies fevers and chills.  Review of Systems:  All other review of systems: otherwise negative   Vital Signs:  BP 129/77   Pulse 79   Temp 98.3 F (36.8 C) (Oral)   Resp 12   Ht 5' 9 (1.753 m)   Wt 244 lb (110.7 kg)   SpO2 97%   BMI 36.03 kg/m    Physical Exam:  Physical Exam Vitals reviewed.  Constitutional:      Appearance: Normal appearance.  Abdominal:     Comments: Abdomen soft, nondistended, no percussion tenderness, nontender to palpation; no rigidity, guarding, rebound tenderness; left upper quadrant I&D site with red granulation tissue at the base, no significant induration at the lateral aspect of the incision, no purulent drainage  Neurological:     Mental Status: She is alert.     Laboratory studies: None  Imaging:  None  Assessment:  53 y.o. yo Female who presents for follow-up status post left abdominal abscess I&D in office on 5/29  Plan:  - Patient still doing well since her surgery - Continue with 3 times a week dressing changes of half-inch iodoform - Follow up with me in 2 weeks  All of the above recommendations were discussed with the patient, and all of patient's questions were answered to her expressed satisfaction.  Note: Portions of this report may have been transcribed using voice recognition software. Every effort has been made to ensure accuracy; however, inadvertent computerized transcription errors may still be present.   Dorothyann Brittle, DO [facility] [address] [place], [place]  [address] [phone] (office)

## 2024-06-28 ENCOUNTER — Ambulatory Visit (INDEPENDENT_AMBULATORY_CARE_PROVIDER_SITE_OTHER): Admitting: Surgery

## 2024-06-28 ENCOUNTER — Encounter: Payer: Self-pay | Admitting: Surgery

## 2024-06-28 ENCOUNTER — Other Ambulatory Visit: Payer: Self-pay

## 2024-06-28 VITALS — BP 135/85 | HR 76 | Temp 97.7°F | Resp 16 | Ht 69.0 in | Wt 244.0 lb

## 2024-06-28 DIAGNOSIS — Z09 Encounter for follow-up examination after completed treatment for conditions other than malignant neoplasm: Secondary | ICD-10-CM

## 2024-06-28 DIAGNOSIS — L02211 Cutaneous abscess of abdominal wall: Secondary | ICD-10-CM

## 2024-06-28 NOTE — Progress Notes (Signed)
 Patient seen in office for dressing and packing change to abdominal wall wound.    Packing removed. Wound cleansed with Normal Saline. Moderate bleeding noted with purulent drainage expressed from deep incision to left side with slight pressure. Noted hardened knot to left side of incision appears to be moving closer to the surface the skin. Used cotton swab to measure depth of wound and noted that swab was able to be seen moving under the skin of the knot. Moderate purulent drainage expressed from area once swab removed. Swab extended 5 cm into wound cavity.    Approximately 9 of Iodoform 1/2 inch gauze packed into wound and Mepilex 4 x 4 bandage applied.    No C/O pain, N/V, fever/ chills, etc.    Patient is scheduled for 3x weekly dressing changes.  Discussed with provider who recommended follow up for evaluation. Appointment scheduled.

## 2024-06-29 ENCOUNTER — Ambulatory Visit (INDEPENDENT_AMBULATORY_CARE_PROVIDER_SITE_OTHER): Admitting: Surgery

## 2024-06-29 ENCOUNTER — Encounter: Payer: Self-pay | Admitting: Surgery

## 2024-06-29 VITALS — BP 124/80 | HR 72 | Temp 98.0°F | Resp 16 | Ht 69.0 in | Wt 242.0 lb

## 2024-06-29 DIAGNOSIS — L02211 Cutaneous abscess of abdominal wall: Secondary | ICD-10-CM

## 2024-06-29 NOTE — Progress Notes (Unsigned)
 [facility] Note   HPI:  53 y.o. Female presents to clinic for re-evaluation of her abdominal wall abscess.  She is s/p abdominal wall abscess I&D on 5/29.  When she presented for nursing dressing change yesterday, she noted an area of swelling and pain lateral to the incision and drainage site.  Once this area was probed, purulent drainage was noted.  She denies fevers and chills.  Review of Systems:  All other review of systems: otherwise negative   Vital Signs:  BP 124/80   Pulse 72   Temp 98 F (36.7 C) (Oral)   Resp 16   Ht 5' 9 (1.753 m)   Wt 242 lb (109.8 kg)   SpO2 98%   BMI 35.74 kg/m    Physical Exam:  Physical Exam Vitals reviewed.  Constitutional:      Appearance: Normal appearance.  Abdominal:     Comments: Abdomen soft, nondistended, no percussion tenderness, nontender to palpation; Left sided abdominal wall I&D site with worsening induration and tenderness lateral to the previous opening  Neurological:     Mental Status: She is alert.     Laboratory studies: None   Imaging:  None   Assessment:  53 y.o. yo Female who presents for re-evaluation of her abdominal wall abscess.  Plan:  -We discussed that since this is the second time that she has had purulent drainage with concern for abscess at the lateral aspect of her previous I&D site, I recommend that we proceed with repeat incision and drainage today to the second area of concern  -The risks and benefits of abdominal wall abscess incision and drainage were discussed with the patient, including but not limited to bleeding, persistent infection, need for additional procedures, and poor wound healing.  After careful consideration, Erin Sexton has decided to proceed with surgery. -Please refer to details of procedure below -Will hold off on any further antibiotics at this time -Advised that if this area has not made significant improvements within the next month, will plan to take the  patient to the operating room for washout and closure of the area -Continue with three times a week dressing changes in the office -Follow up with me next week  All of the above recommendations were discussed with the patient, and all of patient's questions were answered to her expressed satisfaction.   Bedside procedure note   Preoperative Diagnosis: Abdominal wall abscess Postoperative Diagnosis: Abdominal wall abscess   Procedure(s) Performed: Incision and drainage of abdominal wall abscess   Performing provider: Dorothyann Caid Radin, DO   Estimated Blood Loss: Minimal   Findings: Abdominal wall induration without significant purulent drainage, counter incision made in area of induration and communicates with previous incision and drainage cavity   Procedure: At bedside, abdominal wall abscess and previous incision and drainage site were prepped with Betadine and draped with OR towels.  Verbal and written consent were obtained from the patient prior to beginning of procedure.  Timeout was performed.  Using scalpel, *** Patient tolerated the procedure without issue.   Note: Portions of this report may have been transcribed using voice recognition software. Every effort has been made to ensure accuracy; however, inadvertent computerized transcription errors may still be present.   Erin Brittle, DO [facility] [address] [place], [place] [address] [phone] (office)

## 2024-06-30 ENCOUNTER — Other Ambulatory Visit: Payer: Self-pay

## 2024-06-30 ENCOUNTER — Encounter: Payer: Self-pay | Admitting: Surgery

## 2024-06-30 ENCOUNTER — Ambulatory Visit (INDEPENDENT_AMBULATORY_CARE_PROVIDER_SITE_OTHER): Admitting: Surgery

## 2024-06-30 VITALS — Ht 69.0 in | Wt 242.0 lb

## 2024-06-30 DIAGNOSIS — Z09 Encounter for follow-up examination after completed treatment for conditions other than malignant neoplasm: Secondary | ICD-10-CM

## 2024-06-30 DIAGNOSIS — L02211 Cutaneous abscess of abdominal wall: Secondary | ICD-10-CM

## 2024-06-30 NOTE — Progress Notes (Signed)
 Patient seen in office for dressing and packing change to abdominal wall I&D x 2.    Packing removed from original incision to the left side of abdomen. Wound cleansed with Normal Saline. Mild bleeding noted with no purulent drainage.  Packing removed from lateral incision to the left side of abdomen. Wound cleansed with Normal Saline. Moderate bleeding noted with no purulent drainage.  Approximately 10 of Iodoform 1/2 inch gauze packed into left sided incision and approximately 13 of Iodoform 1/2 inch gauze packed into lateral incision. Mepilex 4 x 4 bandage x 2 applied and overlapped.    No C/O pain, N/V, fever/ chills, etc.   Patient is scheduled for 3x weekly dressing changes.  Patient has appointment to follow up with provider on 07/08/2024.

## 2024-07-02 ENCOUNTER — Encounter: Payer: Self-pay | Admitting: Surgery

## 2024-07-02 ENCOUNTER — Other Ambulatory Visit: Payer: Self-pay

## 2024-07-02 ENCOUNTER — Ambulatory Visit: Admitting: Surgery

## 2024-07-02 VITALS — BP 138/64 | HR 76 | Temp 98.1°F | Resp 14 | Ht 69.0 in | Wt 242.0 lb

## 2024-07-02 DIAGNOSIS — Z09 Encounter for follow-up examination after completed treatment for conditions other than malignant neoplasm: Secondary | ICD-10-CM

## 2024-07-02 DIAGNOSIS — L02211 Cutaneous abscess of abdominal wall: Secondary | ICD-10-CM

## 2024-07-02 NOTE — Progress Notes (Signed)
 Patient seen in office for dressing and packing change to abdominal wall I&D x 2.    Packing removed from original incision to the left side of abdomen. Wound cleansed with Normal Saline. Mild bleeding noted with no purulent drainage.   Packing removed from lateral incision to the left side of abdomen. Wound cleansed with Normal Saline. Moderate bleeding noted with no purulent drainage.   Approximately 9 of Iodoform 1/2 inch gauze packed into left sided incision and approximately 12 of Iodoform 1/2 inch gauze packed into lateral incision. Mepilex 4 x 4 bandage x 2 applied and overlapped.    No C/O pain, N/V, fever/ chills, etc.    Patient is scheduled for 3x weekly dressing changes. Patient has appointment to follow up with provider on 07/08/2024.

## 2024-07-05 ENCOUNTER — Other Ambulatory Visit: Payer: Self-pay

## 2024-07-05 ENCOUNTER — Ambulatory Visit (INDEPENDENT_AMBULATORY_CARE_PROVIDER_SITE_OTHER): Admitting: Surgery

## 2024-07-05 ENCOUNTER — Encounter: Payer: Self-pay | Admitting: Surgery

## 2024-07-05 VITALS — BP 138/85 | HR 73 | Temp 98.1°F | Resp 16 | Ht 69.0 in | Wt 242.0 lb

## 2024-07-05 DIAGNOSIS — L02211 Cutaneous abscess of abdominal wall: Secondary | ICD-10-CM

## 2024-07-05 NOTE — Progress Notes (Signed)
 Patient seen in office for dressing and packing change to abdominal wall I&D x 2.    Packing removed from original incision to the left side of abdomen. Wound cleansed with Normal Saline. Mild bleeding noted with no purulent drainage.   Packing removed from lateral incision to the left side of abdomen. Wound cleansed with Normal Saline. Mild bleeding noted with no purulent drainage.   Approximately 9 of Iodoform 1/2 inch gauze packed into left sided incision and approximately 12 of Iodoform 1/2 inch gauze packed into lateral incision. Mepilex 4 x 4 bandage x 2 applied and overlapped.    No C/O pain, N/V, fever/ chills, etc.    Patient is scheduled for 3x weekly dressing changes. Patient has appointment to follow up with provider on 07/08/2024.

## 2024-07-06 ENCOUNTER — Encounter: Admitting: Surgery

## 2024-07-06 ENCOUNTER — Encounter: Admitting: Physician Assistant

## 2024-07-07 ENCOUNTER — Ambulatory Visit (INDEPENDENT_AMBULATORY_CARE_PROVIDER_SITE_OTHER): Admitting: Surgery

## 2024-07-07 ENCOUNTER — Encounter: Payer: Self-pay | Admitting: Surgery

## 2024-07-07 ENCOUNTER — Other Ambulatory Visit: Payer: Self-pay | Admitting: Internal Medicine

## 2024-07-07 ENCOUNTER — Other Ambulatory Visit: Payer: Self-pay

## 2024-07-07 ENCOUNTER — Encounter: Payer: Self-pay | Admitting: Plastic Surgery

## 2024-07-07 VITALS — BP 138/84 | HR 82 | Temp 98.0°F | Resp 18 | Ht 69.0 in | Wt 242.0 lb

## 2024-07-07 DIAGNOSIS — J4540 Moderate persistent asthma, uncomplicated: Secondary | ICD-10-CM

## 2024-07-07 DIAGNOSIS — L02211 Cutaneous abscess of abdominal wall: Secondary | ICD-10-CM

## 2024-07-07 NOTE — Progress Notes (Signed)
 Patient seen in office for dressing and packing change to abdominal wall I&D x 2.    Packing removed from original incision to the left side of abdomen. Wound cleansed with Normal Saline. Mild bleeding noted with no purulent drainage.   Packing removed from lateral incision to the left side of abdomen. Wound cleansed with Normal Saline. Moderate bleeding noted with no purulent drainage.   Approximately 7 of Iodoform 1/2 inch gauze packed into left sided incision and approximately 12 of Iodoform 1/2 inch gauze packed into lateral incision. Mepilex 4 x 4 bandage x 2 applied and overlapped.    No C/O pain, N/V, fever/ chills, etc.    Patient is scheduled for 3x weekly dressing changes. Patient has appointment to follow up with provider on 07/08/2024.

## 2024-07-08 ENCOUNTER — Encounter: Payer: Self-pay | Admitting: Surgery

## 2024-07-08 ENCOUNTER — Ambulatory Visit: Admitting: Surgery

## 2024-07-08 VITALS — BP 153/89 | HR 73 | Temp 97.8°F | Resp 16 | Ht 69.0 in | Wt 241.0 lb

## 2024-07-08 DIAGNOSIS — L02211 Cutaneous abscess of abdominal wall: Secondary | ICD-10-CM

## 2024-07-09 ENCOUNTER — Ambulatory Visit: Admitting: Surgery

## 2024-07-09 ENCOUNTER — Other Ambulatory Visit: Payer: Self-pay

## 2024-07-09 ENCOUNTER — Encounter: Payer: Self-pay | Admitting: Surgery

## 2024-07-09 VITALS — BP 133/85 | HR 81 | Temp 98.5°F | Resp 16 | Ht 69.0 in | Wt 241.0 lb

## 2024-07-09 DIAGNOSIS — Z09 Encounter for follow-up examination after completed treatment for conditions other than malignant neoplasm: Secondary | ICD-10-CM

## 2024-07-09 DIAGNOSIS — L02211 Cutaneous abscess of abdominal wall: Secondary | ICD-10-CM

## 2024-07-09 NOTE — Progress Notes (Signed)
 Patient seen in office for dressing and packing change to abdominal wall I&D x 2.    Packing removed from original incision to the left side of abdomen. Wound cleansed with Normal Saline. Mild bleeding noted with no purulent drainage.   Packing removed from lateral incision to the left side of abdomen. Wound cleansed with Normal Saline. Mild bleeding noted with no purulent drainage.   Approximately 5 of Iodoform 1/2 inch gauze packed into left sided incision and approximately 10 of Iodoform 1/2 inch gauze packed into lateral incision. Mepilex 4 x 4 bandage x 2 applied and overlapped.    No C/O pain, N/V, fever/ chills, etc.    Patient is scheduled for 3x weekly dressing changes. Patient has appointment to follow up with provider on 07/20/2024

## 2024-07-09 NOTE — Progress Notes (Signed)
 [facility] Note   HPI:  53 y.o. Female presents to clinic for reevaluation of her abdominal wall abscess wounds.  She is status post abdominal wall abscess incision and drainage on 5/29 and repeat I&D on 7/22.  Since her last visit, she has been doing well.  She denies any significant purulent drainage from the areas.  She has been getting 3 times weekly dressing changes with my nurse.  She denies fevers and chills.  Denies worsening abdominal pain.  Review of Systems:  All other review of systems: otherwise negative   Vital Signs:  BP (!) 153/89   Pulse 73   Temp 97.8 F (36.6 C) (Oral)   Resp 16   Ht 5' 9 (1.753 m)   Wt 241 lb (109.3 kg)   SpO2 98%   BMI 35.59 kg/m    Physical Exam:  Physical Exam Vitals reviewed.  Constitutional:      Appearance: Normal appearance.  Abdominal:     Comments: Abdomen soft, nondistended, nontender to palpation; left-sided abdominal wall I&D sites with no significant surrounding induration or erythema, no purulent drainage noted  Neurological:     Mental Status: She is alert.     Laboratory studies: None  Imaging:  None  Assessment:  53 y.o. yo Female who presents for reevaluation of her abdominal wall abscess, and is status post I&D on 5/29 and 7/22.  Plan:  - The wounds were irrigated and iodoform packing was reinserted. - We discussed that if the patient does not have significant improvement in these wounds in the next 2 weeks, she will need to be scheduled for surgery for this area to be completely opened up and sewn closed given recurrent abscesses in this area and areas taking a significantly long time to close - Continue with 3 times a week dressing/packing changes - Follow up with me in 2 weeks  All of the above recommendations were discussed with the patient, and all of patient's questions were answered to her expressed satisfaction.  Note: Portions of this report may have been transcribed using voice  recognition software. Every effort has been made to ensure accuracy; however, inadvertent computerized transcription errors may still be present.   Dorothyann Brittle, DO [facility] [address] [place], [place] [address] [phone] (office)

## 2024-07-12 ENCOUNTER — Ambulatory Visit (INDEPENDENT_AMBULATORY_CARE_PROVIDER_SITE_OTHER): Admitting: Surgery

## 2024-07-12 ENCOUNTER — Other Ambulatory Visit: Payer: Self-pay

## 2024-07-12 ENCOUNTER — Encounter: Payer: Self-pay | Admitting: Surgery

## 2024-07-12 VITALS — BP 125/84 | HR 78 | Temp 98.0°F | Resp 16 | Ht 69.0 in | Wt 241.0 lb

## 2024-07-12 DIAGNOSIS — L02211 Cutaneous abscess of abdominal wall: Secondary | ICD-10-CM

## 2024-07-12 DIAGNOSIS — Z09 Encounter for follow-up examination after completed treatment for conditions other than malignant neoplasm: Secondary | ICD-10-CM

## 2024-07-12 NOTE — Progress Notes (Signed)
 Patient seen in office for dressing and packing change to abdominal wall I&D x 2.    Packing removed from original incision to the left side of abdomen. Wound cleansed with Normal Saline. Mild bleeding noted with no purulent drainage.   Packing removed from lateral incision to the left side of abdomen. Wound cleansed with Normal Saline. Mild bleeding noted with no purulent drainage.   Approximately 5 of Iodoform 1/2 inch gauze packed into left sided incision and approximately 9 of Iodoform 1/2 inch gauze packed into lateral incision. Mepilex 4 x 4 bandage x 2 applied and overlapped.    No C/O pain, N/V, fever/ chills, etc.    Patient is scheduled for 3x weekly dressing changes. Patient has appointment to follow up with provider on 07/20/2024

## 2024-07-14 ENCOUNTER — Ambulatory Visit (INDEPENDENT_AMBULATORY_CARE_PROVIDER_SITE_OTHER): Admitting: Surgery

## 2024-07-14 ENCOUNTER — Other Ambulatory Visit: Payer: Self-pay

## 2024-07-14 ENCOUNTER — Encounter: Payer: Self-pay | Admitting: Surgery

## 2024-07-14 VITALS — BP 112/76 | HR 85 | Temp 97.2°F | Resp 16 | Ht 69.0 in | Wt 241.0 lb

## 2024-07-14 DIAGNOSIS — L02211 Cutaneous abscess of abdominal wall: Secondary | ICD-10-CM

## 2024-07-14 DIAGNOSIS — Z09 Encounter for follow-up examination after completed treatment for conditions other than malignant neoplasm: Secondary | ICD-10-CM

## 2024-07-14 NOTE — Progress Notes (Signed)
 Patient seen in office for dressing and packing change to abdominal wall I&D x 2.    Packing removed from original incision to the left side of abdomen. Wound cleansed with Normal Saline. Mild bleeding noted with no purulent drainage.   Packing removed from lateral incision to the left side of abdomen. Wound cleansed with Normal Saline. Mild to moderate bleeding noted with no purulent drainage. Blood clot x2 flushed out with NS.   Approximately 5 of Iodoform 1/2 inch gauze packed into left sided incision and approximately 8 of Iodoform 1/2 inch gauze packed into lateral incision. Mepilex 4 x 4 bandage x 2 applied and overlapped.    No C/O pain, N/V, fever/ chills, etc.    Patient is scheduled for 3x weekly dressing changes. Patient has appointment to follow up with provider on 07/20/2024

## 2024-07-16 ENCOUNTER — Ambulatory Visit (INDEPENDENT_AMBULATORY_CARE_PROVIDER_SITE_OTHER): Admitting: Surgery

## 2024-07-16 ENCOUNTER — Encounter: Payer: Self-pay | Admitting: Surgery

## 2024-07-16 VITALS — BP 128/80 | HR 78 | Temp 98.4°F | Resp 14 | Ht 69.0 in | Wt 241.0 lb

## 2024-07-16 DIAGNOSIS — L02211 Cutaneous abscess of abdominal wall: Secondary | ICD-10-CM

## 2024-07-16 NOTE — Progress Notes (Signed)
 Patient seen in office for dressing and packing change to abdominal wall I&D x 2.    Packing removed from original incision to the left side of abdomen. Wound cleansed with Normal Saline. Mild bleeding noted with no purulent drainage.   Packing removed from lateral incision to the left side of abdomen. Wound cleansed with Normal Saline. Mild to moderate bleeding noted with no purulent drainage. Blood clot x2 flushed out with NS.   Approximately 4 of Iodoform 1/2 inch gauze packed into left sided incision and approximately 7 of Iodoform 1/2 inch gauze packed into lateral incision. Mepilex 4 x 4 bandage x 2 applied and overlapped.    No C/O pain, N/V, fever/ chills, etc.    Patient is scheduled for 3x weekly dressing changes. Patient has appointment to follow up with provider on 07/20/2024

## 2024-07-17 ENCOUNTER — Other Ambulatory Visit: Payer: Self-pay | Admitting: Internal Medicine

## 2024-07-17 DIAGNOSIS — E119 Type 2 diabetes mellitus without complications: Secondary | ICD-10-CM

## 2024-07-19 ENCOUNTER — Other Ambulatory Visit: Payer: Self-pay

## 2024-07-19 ENCOUNTER — Ambulatory Visit (INDEPENDENT_AMBULATORY_CARE_PROVIDER_SITE_OTHER): Admitting: Surgery

## 2024-07-19 ENCOUNTER — Encounter: Payer: Self-pay | Admitting: Surgery

## 2024-07-19 VITALS — BP 119/78 | HR 76 | Temp 98.0°F | Resp 18 | Ht 69.0 in | Wt 241.0 lb

## 2024-07-19 DIAGNOSIS — L02211 Cutaneous abscess of abdominal wall: Secondary | ICD-10-CM

## 2024-07-19 DIAGNOSIS — Z09 Encounter for follow-up examination after completed treatment for conditions other than malignant neoplasm: Secondary | ICD-10-CM

## 2024-07-19 NOTE — Progress Notes (Signed)
 Patient seen in office for dressing and packing change to abdominal wall I&D x 2.    Packing removed from original incision to the left side of abdomen. Wound cleansed with Normal Saline. Mild bleeding noted with no purulent drainage.   Packing removed from lateral incision to the left side of abdomen. Wound cleansed with Normal Saline. Mild to moderate bleeding noted with no purulent drainage.    Approximately 5 of Iodoform 1/2 inch gauze packed into left sided incision and approximately 5 of Iodoform 1/2 inch gauze packed into lateral incision. Mepilex 4 x 4 bandage x 2 applied and overlapped.    No C/O pain, N/V, fever/ chills, etc.    Patient is scheduled for 3x weekly dressing changes. Patient has appointment to follow up with provider on 07/20/2024

## 2024-07-20 ENCOUNTER — Other Ambulatory Visit: Payer: Self-pay

## 2024-07-20 ENCOUNTER — Ambulatory Visit (INDEPENDENT_AMBULATORY_CARE_PROVIDER_SITE_OTHER): Admitting: Surgery

## 2024-07-20 ENCOUNTER — Encounter: Payer: Self-pay | Admitting: Surgery

## 2024-07-20 VITALS — BP 124/82 | HR 80 | Temp 98.2°F | Resp 16 | Ht 69.0 in | Wt 239.0 lb

## 2024-07-20 DIAGNOSIS — S31109D Unspecified open wound of abdominal wall, unspecified quadrant without penetration into peritoneal cavity, subsequent encounter: Secondary | ICD-10-CM

## 2024-07-20 DIAGNOSIS — L02211 Cutaneous abscess of abdominal wall: Secondary | ICD-10-CM | POA: Diagnosis not present

## 2024-07-20 NOTE — Progress Notes (Signed)
 [facility] Note   HPI:  53 y.o. Female presents to clinic for reevaluation of her abdominal wall abscess wounds.  She is status post abdominal wall abscess incision and drainage on 5/29 and repeat I&D on 7/22.  She has been doing well since her last visit with me.  She has been undergoing 3 times a week dressing changes in the office.  She denies fevers and chills.  The areas have become smaller and are taking less packing each time.  Review of Systems:  All other review of systems: otherwise negative   Vital Signs:  BP 124/82   Pulse 80   Temp 98.2 F (36.8 C) (Oral)   Resp 16   Ht 5' 9 (1.753 m)   Wt 239 lb (108.4 kg)   SpO2 98%   BMI 35.29 kg/m    Physical Exam:  Physical Exam Vitals reviewed.  Constitutional:      Appearance: Normal appearance.  Abdominal:     Comments: Abdomen soft, nondistended, nontender to palpation; left-sided abdominal wall I&D sites with no significant surrounding induration or erythema, no purulent drainage, pink granulation tissue at the base  Neurological:     Mental Status: She is alert.     Laboratory studies: None  Imaging:  None  Assessment:  53 y.o. yo Female who presents for reevaluation of abdominal wall abscesses and is status post incision and drainage in the office on 5/29 and 7/22  Plan:  - Wounds were irrigated and iodoform packing was reinserted - Given that the areas have improved and that it is requiring less packing, we will hold off on taking the patient to the operating room for washout and closure - Continue with 3 times a week dressing/packing changes - Follow up in 2 weeks  All of the above recommendations were discussed with the patient, and all of patient's questions were answered to her expressed satisfaction.  Note: Portions of this report may have been transcribed using voice recognition software. Every effort has been made to ensure accuracy; however, inadvertent computerized transcription  errors may still be present.   Dorothyann Brittle, DO [facility] [address] [place], [place] [address] [phone] (office)

## 2024-07-21 ENCOUNTER — Ambulatory Visit: Payer: Self-pay | Admitting: General Surgery

## 2024-07-23 ENCOUNTER — Encounter: Payer: Self-pay | Admitting: Surgery

## 2024-07-23 ENCOUNTER — Other Ambulatory Visit: Payer: Self-pay

## 2024-07-23 ENCOUNTER — Ambulatory Visit (INDEPENDENT_AMBULATORY_CARE_PROVIDER_SITE_OTHER): Admitting: Surgery

## 2024-07-23 VITALS — BP 138/86 | HR 76 | Temp 97.7°F | Resp 16 | Ht 69.0 in | Wt 239.0 lb

## 2024-07-23 DIAGNOSIS — L02211 Cutaneous abscess of abdominal wall: Secondary | ICD-10-CM

## 2024-07-23 NOTE — Progress Notes (Signed)
 Patient seen in office for dressing and packing change to abdominal wall I&D x 2.    Packing removed from original incision to the left side of abdomen. Wound cleansed with Normal Saline. Mild bleeding noted with no purulent drainage.   Packing removed from lateral incision to the left side of abdomen. Wound cleansed with Normal Saline. Mild to moderate bleeding noted with no purulent drainage.    Approximately 4 of Iodoform 1/2 inch gauze packed into left sided incision and approximately 2 of Iodoform 1/2 inch gauze packed into lateral incision. Mepilex 4 x 4 bandage x 2 applied and overlapped.    No C/O pain, N/V, fever/ chills, etc.    Patient is scheduled for 2x weekly dressing changes.

## 2024-07-26 ENCOUNTER — Ambulatory Visit (HOSPITAL_COMMUNITY): Admission: RE | Admit: 2024-07-26 | Source: Home / Self Care | Admitting: General Surgery

## 2024-07-26 ENCOUNTER — Encounter (HOSPITAL_COMMUNITY): Admission: RE | Payer: Self-pay | Source: Home / Self Care

## 2024-07-26 SURGERY — REPAIR, HERNIA, UMBILICAL, ADULT
Anesthesia: General

## 2024-07-27 ENCOUNTER — Encounter: Payer: Self-pay | Admitting: General Surgery

## 2024-07-27 ENCOUNTER — Other Ambulatory Visit: Payer: Self-pay

## 2024-07-27 ENCOUNTER — Ambulatory Visit (INDEPENDENT_AMBULATORY_CARE_PROVIDER_SITE_OTHER): Admitting: General Surgery

## 2024-07-27 VITALS — BP 122/79 | HR 75 | Temp 98.1°F | Resp 16 | Ht 69.0 in | Wt 239.0 lb

## 2024-07-27 DIAGNOSIS — Z09 Encounter for follow-up examination after completed treatment for conditions other than malignant neoplasm: Secondary | ICD-10-CM

## 2024-07-27 NOTE — Progress Notes (Signed)
 Patient seen in office for dressing and packing change to abdominal wall I&D x 2.    Packing removed from original incision to the left side of abdomen. Wound cleansed with Normal Saline. Mild bleeding noted with no purulent drainage.   Packing removed from lateral incision to the left side of abdomen. Wound cleansed with Normal Saline. Mild to moderate bleeding noted with no purulent drainage.    Approximately 4 of Iodoform 1/2 inch gauze packed into left sided incision and approximately 2 of Iodoform 1/2 inch gauze packed into lateral incision. Mepilex 4 x 4 bandage x 2 applied and overlapped.    No C/O pain, N/V, fever/ chills, etc.    Patient is scheduled for 2x weekly dressing changes.

## 2024-07-29 ENCOUNTER — Ambulatory Visit: Admitting: General Surgery

## 2024-07-29 ENCOUNTER — Other Ambulatory Visit: Payer: Self-pay

## 2024-07-29 ENCOUNTER — Other Ambulatory Visit: Payer: Self-pay | Admitting: Internal Medicine

## 2024-07-29 ENCOUNTER — Encounter: Payer: Self-pay | Admitting: General Surgery

## 2024-07-29 VITALS — BP 125/83 | HR 71 | Temp 98.1°F | Resp 16 | Ht 69.0 in | Wt 239.0 lb

## 2024-07-29 DIAGNOSIS — Z09 Encounter for follow-up examination after completed treatment for conditions other than malignant neoplasm: Secondary | ICD-10-CM

## 2024-07-29 DIAGNOSIS — E1169 Type 2 diabetes mellitus with other specified complication: Secondary | ICD-10-CM

## 2024-07-29 DIAGNOSIS — L02211 Cutaneous abscess of abdominal wall: Secondary | ICD-10-CM

## 2024-07-29 NOTE — Progress Notes (Signed)
 Patient seen in office for dressing and packing change to abdominal wall I&D x 2.    Packing removed from original incision to the left side of abdomen. Wound cleansed with Normal Saline. Mild bleeding noted with no purulent drainage.   Packing removed from lateral incision to the left side of abdomen. Wound cleansed with Normal Saline. Mild to moderate bleeding noted with no purulent drainage.    Approximately 4 of Iodoform 1/2 inch gauze packed into left sided incision and approximately 1 of Iodoform 1/2 inch gauze packed into lateral incision. Mepilex 4 x 4 bandage x 2 applied and overlapped.

## 2024-07-30 ENCOUNTER — Encounter: Admitting: General Surgery

## 2024-08-02 ENCOUNTER — Encounter: Admitting: Plastic Surgery

## 2024-08-02 ENCOUNTER — Encounter: Payer: Self-pay | Admitting: Internal Medicine

## 2024-08-02 ENCOUNTER — Ambulatory Visit: Attending: Internal Medicine | Admitting: Internal Medicine

## 2024-08-02 VITALS — BP 124/80 | HR 83 | Ht 69.0 in | Wt 243.2 lb

## 2024-08-02 DIAGNOSIS — I421 Obstructive hypertrophic cardiomyopathy: Secondary | ICD-10-CM | POA: Insufficient documentation

## 2024-08-02 MED ORDER — VERAPAMIL HCL ER 240 MG PO TBCR: 240.0000 mg | EXTENDED_RELEASE_TABLET | Freq: Every day | ORAL | 2 refills | Status: AC

## 2024-08-02 NOTE — Progress Notes (Signed)
 Cardiology Office Note  Date: 08/02/2024   ID: Erin Sexton, DOB [birth year], MRN [id number]  PCP:  Erin Suzzane POUR, MD  Cardiologist:  [name], MD Electrophysiologist:  None   History of Present Illness: Erin Sexton is a 53 y.o. female known to have hypertrophic obstructive cardiomyopathy (per echo from 2017 and 2022), HTN, DM 2, HLD, morbid obesity, chronic diastolic heart failure, OSA is here for follow-up visit.  After starting verapamil  120 mg once daily, her SOB significantly improved but she still has some residual SOB. Limited echocardiogram performed in April 2025 showed peak LVOT gradient 23 mmHg at rest that increased to 77 mmHg with Valsalva.  She was scheduled for hernia repair and ?  Panniculectomy, preop recommendations are as below: She is at a moderate risk. Continue BB and Verapamil  throughout the perioperative period without any interruptions. Avoid reduced preload, reduced afterload and tachycardia. If hypotension develops, start IVF; vasopressin or phenylephrine are the pressors of choice.  However surgery was canceled on the day before the preop appointment.  Patient was disappointed.  She does not have any symptoms of angina.  No dizziness, lightheadedness, syncope, palpitations or leg swelling.   She lost around 150 pounds of weight in the last 3 years after starting Mounjaro .  Past Medical History:  Diagnosis Date   Abnormal CT scan    a. 02/2014: evaluated for dyspnea and initially placed on Xarelto  for PE because radiologist could not exclude small peripheral pulmonary emboli because of limited contrast. However, after admission for ++vaginal bleeding several days later, it was felt she did NOT have PE - underwent further testing with neg VQ 03/06/14, neg LE duplex.   Anemia    Anxiety    Arthritis    Asthma    Blood transfusion without reported diagnosis    Bone spur of right foot    CHF (congestive heart failure) (HCC)    COPD (chronic  obstructive pulmonary disease) (HCC)    Depression    Diabetes mellitus without complication (HCC)    a. Dx 02/2014.   Heart murmur    History of echocardiogram 2015   normal EF, Grade II diastolic dysfunction   HOCM (hypertrophic obstructive cardiomyopathy) (HCC)    a. Dx 67984.   Hyperlipidemia    Hypertension    Morbid obesity (HCC)    NSVT (nonsustained ventricular tachycardia) (HCC)    a. Noted on tele during 02/2014 adm for symptomatic anemia.   Stroke [facility])    SVT (supraventricular tachycardia) (HCC)    a. Noted on tele during 02/2014 adm for symptomatic anemia.   Thyroid  disease    Vaginal bleeding     Past Surgical History:  Procedure Laterality Date   COLONOSCOPY N/A 09/30/2019   Procedure: COLONOSCOPY;  Surgeon: Golda Claudis PENNER, MD;  Location: [facility];  Service: Endoscopy;  Laterality: N/A;  730   DILITATION & CURRETTAGE/HYSTROSCOPY WITH NOVASURE ABLATION N/A 08/07/2016   Procedure: DILATATION & CURETTAGE/HYSTEROSCOPY WITH ATTPEMPTED NOVASURE ABLATION AND INSERTION OF IUD;  Surgeon: Vonn VEAR Inch, MD;  Location: [facility]S;  Service: Gynecology;  Laterality: N/A;    Current Outpatient Medications  Medication Sig Dispense Refill   ACCU-CHEK GUIDE TEST test strip USE 1 STRIP TO CHECK GLUCOSE ONCE DAILY 100 each 0   Accu-Chek Softclix Lancets lancets USE 1 LANCET TO CHECK GLUCOSE ONCE DAILY 100 each 3   albuterol  (PROVENTIL ) (5 MG/ML) 0.5% nebulizer solution Take 0.5 mLs (2.5 mg total) by nebulization every 6 (six)  hours as needed for wheezing or shortness of breath. 20 mL 0   albuterol  (VENTOLIN  HFA) 108 (90 Base) MCG/ACT inhaler Inhale 2 puffs into the lungs as needed (wheezing).      atorvastatin  (LIPITOR) 80 MG tablet Take 1 tablet (80 mg total) by mouth daily. 90 tablet 3   blood glucose meter kit and supplies KIT Dispense based on patient and insurance preference. Use once a day. Diagnosis code:E11.9 1 each 0   buPROPion  (WELLBUTRIN  XL) 300 MG 24 hr tablet Take 1  tablet (300 mg total) by mouth daily. 90 tablet 3   Cholecalciferol (VITAMIN D3 PO) Take 10,000 Units by mouth daily.     clindamycin (CLEOCIN T) 1 % lotion      clotrimazole -betamethasone  (LOTRISONE ) cream APPLY  CREAM TOPICALLY TO AFFECTED AREA TWICE DAILY 30 g 2   empagliflozin  (JARDIANCE ) 10 MG TABS tablet Take 1 tablet (10 mg total) by mouth daily. 90 tablet 3   estradiol  (ESTRACE ) 2 MG tablet TAKE 1 TABLET BY MOUTH AT BEDTIME 90 tablet 3   ibuprofen  (ADVIL ) 600 MG tablet Take 1 tablet (600 mg total) by mouth every 8 (eight) hours as needed. 30 tablet 0   levocetirizine (XYZAL ) 5 MG tablet TAKE 1 TABLET BY MOUTH ONCE DAILY IN THE EVENING 90 tablet 0   levonorgestrel  (MIRENA ) 20 MCG/24HR IUD 1 each by Intrauterine route once.     levothyroxine  (SYNTHROID ) 112 MCG tablet Take 1 tablet (112 mcg total) by mouth daily. 90 tablet 1   megestrol  (MEGACE ) 40 MG tablet Take 1 tablet (40 mg total) by mouth daily. 30 tablet 11   metoprolol  tartrate (LOPRESSOR ) 50 MG tablet Take 1 tablet (50 mg total) by mouth 2 (two) times daily. 180 tablet 3   nystatin  (MYCOSTATIN /NYSTOP ) powder Apply 1 Application topically 3 (three) times daily. 60 g 0   Rimegepant Sulfate (NURTEC) 75 MG TBDP Take 1 tablet (75 mg total) by mouth as needed. 8 tablet 11   SYMBICORT  160-4.5 MCG/ACT inhaler Inhale 2 puffs into the lungs as needed. 1 each 2   tirzepatide  (MOUNJARO ) 15 MG/0.5ML Pen INJECT 15 MG SUBCUTANEOUSLY  ONCE A WEEK 9 mL 3   topiramate  (TOPAMAX ) 100 MG tablet Take 1 tablet (100 mg total) by mouth daily. 90 tablet 3   verapamil  (CALAN -SR) 120 MG CR tablet Take 1 tablet (120 mg total) by mouth at bedtime. 90 tablet 1   No current facility-administered medications for this visit.   Allergies:  Patient has no known allergies.   Social History: The patient  reports that she has never smoked. She has never used smokeless tobacco. She reports that she does not drink alcohol and does not use drugs.   Family History: The  patient's family history includes Arthritis in her maternal grandmother and mother; Asthma in her mother; Bipolar disorder in her sister; Depression in her sister; Diabetes in her mother; Heart disease in her father, maternal grandfather, and sister; Hyperlipidemia in her maternal grandmother; Hypertension in her father; Mental illness in her maternal grandmother, mother, and sister; Obesity in her mother; Other in her mother; Uterine cancer in her maternal grandmother; Varicose Veins in her sister.   ROS:  Please see the history of present illness. Otherwise, complete review of systems is positive for none  All other systems are reviewed and negative.   Physical Exam: VS:  BP 124/80   Pulse 83   Ht 5' 9 (1.753 m)   Wt 243 lb 3.2 oz (110.3 kg)  SpO2 99%   BMI 35.91 kg/m , BMI Body mass index is 35.91 kg/m.  Wt Readings from Last 3 Encounters:  08/02/24 243 lb 3.2 oz (110.3 kg)  07/29/24 239 lb (108.4 kg)  07/27/24 239 lb (108.4 kg)    General: Patient appears comfortable at rest. HEENT: Conjunctiva and lids normal, oropharynx clear with moist mucosa. Neck: Supple, no elevated JVP or carotid bruits, no thyromegaly. Lungs: Clear to auscultation, nonlabored breathing at rest. Cardiac: Regular rate and rhythm, no S3 or significant systolic murmur, no pericardial rub. Abdomen: Soft, nontender, no hepatomegaly, bowel sounds present, no guarding or rebound. Extremities: No pitting edema, distal pulses 2+. Skin: Warm and dry. Musculoskeletal: No kyphosis. Neuropsychiatric: Alert and oriented x3, affect grossly appropriate.  Recent Labwork: 04/26/2024: Hemoglobin 16.7; Platelets 286 06/17/2024: ALT 45; AST 34; BUN 21; Creatinine, Ser 1.24; Potassium 4.2; Sodium 142; TSH 2.960     Component Value Date/Time   CHOL 95 (L) 06/16/2023 1121   TRIG 78 06/16/2023 1121   HDL 34 (L) 06/16/2023 1121   CHOLHDL 2.8 06/16/2023 1121   CHOLHDL 2.7 06/27/2020 1355   VLDL 18 06/21/2016 0510   LDLCALC  45 06/16/2023 1121   LDLCALC 21 06/27/2020 1355     Assessment and Plan:  HOCM Moderate to severe MR - Echocardiogram from 2017 and 2022 consistent with hypertrophic obstructive cardiomyopathy.  Cardiac MRI confirmed hypertrophic cardiomyopathy with asymmetric LVH measuring 16 mm in septum with moderate to severe MR and 8% LGE. - After starting verapamil  120 mg once daily, Limited echocardiogram performed in April 2025 showed peak LVOT gradient 23 mmHg at rest that increased to 77 mmHg with Valsalva. - Patient reported improvement in SOB after starting verapamil . - Continue metoprolol  tartrate 50 mg twice daily and increase verapamil  120 mg to 240 mg once daily. - Repeat limited echocardiogram in 1 month after dose uptitration. - Patient is not at high risk for sudden cardiac death. Risk of SCD is 1.77 at 5 years. No benefit of ICD. - No family history of HCM.  Patient's immediate family members will need to be checked for HOCM with an echocardiogram.  Patient is aware. Her sister is scheduled for echocardiogram soon. Patient has no kids. - Patient was interested to be referred to HCM clinic, she has upcoming appointment in the fall 2025.  Genetic testing can be performed in the HCM clinic.  Preop cardiovascular risk stratification for panniculectomy/hernia repair surgery - She is at a low to moderate risk. Continue BB and Verapamil  throughout the perioperative period without any interruptions. Avoid reduced preload, reduced afterload and tachycardia. If hypotension develops, start IVF; vasopressin or phenylephrine are the pressors of choice.  Chronic diastolic heart failure - compensated.  Continue Jardiance  10 mg once daily, metoprolol  rate 50 mg twice daily and lisinopril  12.5 mg once daily.  HTN, controlled - Continue above medications.  I spent 30 minutes reviewing the prior records, imaging, records, more than 3 labs, discussion of hocm, preop risk,'s chronic diastolic heart failure,  HTN and documentation.  I answered all the questions.     Medication Adjustments/Labs and Tests Ordered: Current medicines are reviewed at length with the patient today.  Concerns regarding medicines are outlined above.    Disposition:  Follow up 6 months  Signed Ermias Tomeo Priya Anyely Cunning, MD, 08/02/2024 1:02 PM    [facility] at [facility] [address] [place], [place], [place] [address]

## 2024-08-02 NOTE — Patient Instructions (Addendum)
 Medication Instructions:  Your physician has recommended you make the following change in your medication:  Increase Verapamil  from 120 mg to 240 mg once daily Continue taking all other medications as prescribed  Labwork: None  Testing/Procedures: Your physician has requested that you have an echocardiogram. Echocardiography is a painless test that uses sound waves to create images of your heart. It provides your doctor with information about the size and shape of your heart and how well your heart's chambers and valves are working. This procedure takes approximately one hour. There are no restrictions for this procedure. Please do NOT wear cologne, perfume, aftershave, or lotions (deodorant is allowed). Please arrive 15 minutes prior to your appointment time.  Please note: We ask at that you not bring children with you during ultrasound (echo/ vascular) testing. Due to room size and safety concerns, children are not allowed in the ultrasound rooms during exams. Our front office staff cannot provide observation of children in our lobby area while testing is being conducted. An adult accompanying a patient to their appointment will only be allowed in the ultrasound room at the discretion of the ultrasound technician under special circumstances. We apologize for any inconvenience.   Follow-Up: Your physician recommends that you schedule a follow-up appointment in: 6 months  Any Other Special Instructions Will Be Listed Below (If Applicable). Thank you for choosing [facility]!     If you need a refill on your cardiac medications before your next appointment, please call your pharmacy.

## 2024-08-03 ENCOUNTER — Other Ambulatory Visit: Payer: Self-pay

## 2024-08-03 ENCOUNTER — Encounter: Payer: Self-pay | Admitting: Surgery

## 2024-08-03 ENCOUNTER — Ambulatory Visit (INDEPENDENT_AMBULATORY_CARE_PROVIDER_SITE_OTHER): Admitting: Surgery

## 2024-08-03 VITALS — BP 120/78 | HR 76 | Temp 98.1°F | Resp 16 | Ht 69.0 in | Wt 243.0 lb

## 2024-08-03 DIAGNOSIS — L02211 Cutaneous abscess of abdominal wall: Secondary | ICD-10-CM

## 2024-08-03 DIAGNOSIS — Z09 Encounter for follow-up examination after completed treatment for conditions other than malignant neoplasm: Secondary | ICD-10-CM

## 2024-08-03 NOTE — Progress Notes (Unsigned)
 Patient seen in office for dressing and packing change to abdominal wall I&D x 2.    Packing removed from original incision to the left side of abdomen. Wound cleansed with Normal Saline. Mild bleeding noted with no purulent drainage.   Packing removed from lateral incision to the left side of abdomen. Wound cleansed with Normal Saline. Mild to moderate bleeding noted with no purulent drainage.    Approximately 4 of Iodoform 1/2 inch gauze packed into left sided incision and approximately 0.5 of Iodoform 1/2 inch gauze packed into lateral incision. Mepilex 4 x 4 bandage x 2 applied and overlapped.

## 2024-08-04 ENCOUNTER — Encounter: Payer: Self-pay | Admitting: Internal Medicine

## 2024-08-04 ENCOUNTER — Encounter: Payer: Self-pay | Admitting: General Surgery

## 2024-08-04 NOTE — Progress Notes (Signed)
 Agree with assessment and plan

## 2024-08-04 NOTE — Progress Notes (Signed)
Agree with evaluation and treatment plan

## 2024-08-06 ENCOUNTER — Other Ambulatory Visit: Payer: Self-pay

## 2024-08-06 ENCOUNTER — Encounter: Payer: Self-pay | Admitting: Surgery

## 2024-08-06 ENCOUNTER — Ambulatory Visit (INDEPENDENT_AMBULATORY_CARE_PROVIDER_SITE_OTHER): Admitting: Surgery

## 2024-08-06 VITALS — BP 132/84 | HR 77 | Temp 98.3°F | Resp 16 | Ht 69.0 in | Wt 243.0 lb

## 2024-08-06 DIAGNOSIS — L02211 Cutaneous abscess of abdominal wall: Secondary | ICD-10-CM

## 2024-08-06 DIAGNOSIS — Z09 Encounter for follow-up examination after completed treatment for conditions other than malignant neoplasm: Secondary | ICD-10-CM

## 2024-08-06 NOTE — Progress Notes (Signed)
 Patient seen in office for dressing and packing change to abdominal wall I&D x 2.  Packing removed from original incision to the left side of abdomen. Wound cleansed with Normal Saline. Mild bleeding noted with no purulent drainage. Wound noted to be healing well to the right end of the incision. Opening in the abdomen is becoming much smaller. Iodoform guaze changed to 1/4 inch in the original wound to better fit in the smaller opening. Advised patient that since gauze is half the size of the previous gauze, we may need to use longer packing to make up the difference in width.    Packing removed from lateral incision to the left side of abdomen. Wound cleansed with Normal Saline. Mild to moderate bleeding noted with no purulent drainage.    Approximately 6 of Iodoform 1/4 inch gauze packed into left sided incision.  Approximately 0.5 of Iodoform 1/2 inch gauze packed into lateral incision.   Mepilex 4 x 4 bandage x 2 applied and overlapped.   Patient to follow up with provider on 08/10/2024.

## 2024-08-10 ENCOUNTER — Encounter: Payer: Self-pay | Admitting: Surgery

## 2024-08-10 ENCOUNTER — Ambulatory Visit (INDEPENDENT_AMBULATORY_CARE_PROVIDER_SITE_OTHER): Admitting: Surgery

## 2024-08-10 VITALS — BP 160/97 | HR 72 | Temp 97.7°F | Resp 16 | Ht 69.0 in | Wt 243.0 lb

## 2024-08-10 DIAGNOSIS — S31109D Unspecified open wound of abdominal wall, unspecified quadrant without penetration into peritoneal cavity, subsequent encounter: Secondary | ICD-10-CM

## 2024-08-11 NOTE — Progress Notes (Signed)
 [facility] Note   HPI:  53 y.o. Female presents to clinic for wound recheck at the location of 2 previous office incision and drainage procedures for an abdominal abscess.  Patient has been getting twice weekly dressing changes in the office.  She denies any significant drainage from the area.  She denies any further evidence of induration or worsening pain.  Denies fevers and chills.  Review of Systems:  All other review of systems: otherwise negative   Vital Signs:  BP (!) 160/97   Pulse 72   Temp 97.7 F (36.5 C) (Oral)   Resp 16   Ht 5' 9 (1.753 m)   Wt 243 lb (110.2 kg)   SpO2 98%   BMI 35.88 kg/m    Physical Exam:  Physical Exam Vitals reviewed.  Constitutional:      Appearance: Normal appearance.  Abdominal:     Comments: Soft, nondistended, nontender to palpation; left sided abdominal I&D sites with no significant induration or erythema, no purulent drainage, pink granulation tissue at the base; lateralmost area now just an open wound without any tracking  Neurological:     Mental Status: She is alert.     Laboratory studies: None  Imaging:  None  Assessment:  53 y.o. yo Female who presents for reevaluation of abdominal wall wound status post incision and drainage of abdominal wall abscess on 5/29 and 7/22  Plan:  - Areas are significantly improving.  I was able to pack the medial incision, but I only applied antibiotic ointment to the lateralmost incision.  Once the medial incision becomes too difficult to pack, recommend covering with antibiotic ointment and gauze - Dressings applied during visit - Continue twice a week dressing changes - Follow up with me in 4 to 6 weeks  All of the above recommendations were discussed with the patient, and all of patient's questions were answered to her expressed satisfaction.  Note: Portions of this report may have been transcribed using voice recognition software. Every effort has been made to ensure  accuracy; however, inadvertent computerized transcription errors may still be present.   Dorothyann Brittle, DO [facility] [address] [place], [place] [address] [phone] (office)

## 2024-08-13 ENCOUNTER — Other Ambulatory Visit (HOSPITAL_COMMUNITY): Payer: Self-pay | Admitting: Internal Medicine

## 2024-08-13 ENCOUNTER — Ambulatory Visit (INDEPENDENT_AMBULATORY_CARE_PROVIDER_SITE_OTHER): Admitting: Surgery

## 2024-08-13 ENCOUNTER — Encounter: Payer: Self-pay | Admitting: Surgery

## 2024-08-13 ENCOUNTER — Other Ambulatory Visit: Payer: Self-pay | Admitting: Internal Medicine

## 2024-08-13 ENCOUNTER — Other Ambulatory Visit: Payer: Self-pay

## 2024-08-13 VITALS — BP 121/81 | HR 75 | Temp 97.3°F | Resp 16 | Ht 69.0 in | Wt 243.0 lb

## 2024-08-13 DIAGNOSIS — E039 Hypothyroidism, unspecified: Secondary | ICD-10-CM

## 2024-08-13 DIAGNOSIS — Z09 Encounter for follow-up examination after completed treatment for conditions other than malignant neoplasm: Secondary | ICD-10-CM

## 2024-08-13 DIAGNOSIS — Z1231 Encounter for screening mammogram for malignant neoplasm of breast: Secondary | ICD-10-CM

## 2024-08-13 DIAGNOSIS — S31109D Unspecified open wound of abdominal wall, unspecified quadrant without penetration into peritoneal cavity, subsequent encounter: Secondary | ICD-10-CM

## 2024-08-13 NOTE — Progress Notes (Signed)
 Patient seen in office for dressing and packing change to abdominal wall I&D x 2.   Packing removed from original incision to the left side of abdomen. Wound cleansed with Normal Saline. Mild bleeding noted with no purulent drainage. Wound noted to be healing well to the right end of the incision.  Lateral wound cleansed with Normal Saline. Mild bleeding noted with no purulent drainage.    Approximately 4 of Iodoform 1/4 inch gauze packed into left sided incision.  Triple antibiotic ointment applied to lateral incision.    Mepilex 4 x 4 bandage x 2 applied and overlapped.

## 2024-08-16 ENCOUNTER — Other Ambulatory Visit: Payer: Self-pay | Admitting: Internal Medicine

## 2024-08-16 DIAGNOSIS — E039 Hypothyroidism, unspecified: Secondary | ICD-10-CM

## 2024-08-17 ENCOUNTER — Ambulatory Visit: Admitting: Surgery

## 2024-08-17 ENCOUNTER — Encounter: Payer: Self-pay | Admitting: Surgery

## 2024-08-17 ENCOUNTER — Other Ambulatory Visit: Payer: Self-pay

## 2024-08-17 VITALS — BP 121/74 | HR 73 | Temp 98.1°F | Resp 16 | Ht 69.0 in | Wt 243.0 lb

## 2024-08-17 DIAGNOSIS — S31109D Unspecified open wound of abdominal wall, unspecified quadrant without penetration into peritoneal cavity, subsequent encounter: Secondary | ICD-10-CM

## 2024-08-17 DIAGNOSIS — Z09 Encounter for follow-up examination after completed treatment for conditions other than malignant neoplasm: Secondary | ICD-10-CM

## 2024-08-17 NOTE — Progress Notes (Signed)
 Patient seen in office for dressing and packing change to abdominal wall I&D x 2.   Packing removed from original incision to the left side of abdomen. Wound cleansed with Normal Saline. Mild bleeding noted with no purulent drainage. Wound noted to be healing well to the right end of the incision.   Lateral wound cleansed with Normal Saline. Mild bleeding noted with no purulent drainage.    Approximately 3 of Iodoform 1/4 inch gauze packed into left sided incision.  Triple antibiotic ointment applied to lateral incision.    Mepilex 4 x 4 bandage x 2 applied and overlapped.

## 2024-08-18 ENCOUNTER — Encounter (HOSPITAL_COMMUNITY): Payer: Self-pay

## 2024-08-18 ENCOUNTER — Ambulatory Visit (HOSPITAL_COMMUNITY)
Admission: RE | Admit: 2024-08-18 | Discharge: 2024-08-18 | Disposition: A | Discharge status: Home / Self Care | Source: Ambulatory Visit

## 2024-08-18 DIAGNOSIS — Z1231 Encounter for screening mammogram for malignant neoplasm of breast: Secondary | ICD-10-CM | POA: Diagnosis not present

## 2024-08-20 ENCOUNTER — Other Ambulatory Visit: Payer: Self-pay

## 2024-08-20 ENCOUNTER — Ambulatory Visit (INDEPENDENT_AMBULATORY_CARE_PROVIDER_SITE_OTHER): Admitting: Surgery

## 2024-08-20 ENCOUNTER — Encounter: Payer: Self-pay | Admitting: Surgery

## 2024-08-20 VITALS — BP 127/82 | HR 77 | Temp 97.6°F | Resp 16 | Ht 69.0 in | Wt 243.0 lb

## 2024-08-20 DIAGNOSIS — S31109D Unspecified open wound of abdominal wall, unspecified quadrant without penetration into peritoneal cavity, subsequent encounter: Secondary | ICD-10-CM

## 2024-08-20 NOTE — Progress Notes (Signed)
 Patient seen in office for dressing and packing change to abdominal wall I&D x 2.   Packing removed from original incision to the left side of abdomen. Wound cleansed with Normal Saline. Mild bleeding noted with no purulent drainage. Wound noted to be healing well to the right end of the incision.   Lateral wound cleansed with Normal Saline.  Slight bleeding noted with no purulent drainage.    Approximately 3 of Iodoform 1/4 inch gauze packed into left sided incision.  Triple antibiotic ointment applied to lateral incision.    Mepilex 4 x 4 bandage x 2 applied and overlapped.

## 2024-08-24 ENCOUNTER — Encounter: Payer: Self-pay | Admitting: Surgery

## 2024-08-24 ENCOUNTER — Other Ambulatory Visit: Payer: Self-pay

## 2024-08-24 ENCOUNTER — Ambulatory Visit (INDEPENDENT_AMBULATORY_CARE_PROVIDER_SITE_OTHER): Admitting: Surgery

## 2024-08-24 VITALS — BP 117/76 | HR 74 | Temp 98.2°F | Resp 16 | Ht 69.0 in | Wt 243.0 lb

## 2024-08-24 DIAGNOSIS — S31109D Unspecified open wound of abdominal wall, unspecified quadrant without penetration into peritoneal cavity, subsequent encounter: Secondary | ICD-10-CM

## 2024-08-24 NOTE — Progress Notes (Signed)
 Patient seen in office for dressing and packing change to abdominal wall I&D x 2.   Packing removed from original incision to the left side of abdomen. Wound cleansed with Normal Saline. Mild bleeding noted with no purulent drainage. Wound noted to be healing well to the right end of the incision.   Lateral wound cleansed with Normal Saline.  Slight bleeding noted with no purulent drainage.    Approximately 2.5 of Iodoform 1/4 inch gauze packed into left sided incision.  Triple antibiotic ointment applied to lateral incision.    Mepilex 4 x 4 bandage x 2 applied and overlapped.

## 2024-08-27 ENCOUNTER — Ambulatory Visit (INDEPENDENT_AMBULATORY_CARE_PROVIDER_SITE_OTHER): Admitting: Surgery

## 2024-08-27 ENCOUNTER — Encounter: Payer: Self-pay | Admitting: Surgery

## 2024-08-27 ENCOUNTER — Other Ambulatory Visit: Payer: Self-pay

## 2024-08-27 VITALS — BP 133/83 | HR 75 | Temp 98.1°F | Resp 18 | Ht 69.0 in | Wt 243.0 lb

## 2024-08-27 DIAGNOSIS — L02211 Cutaneous abscess of abdominal wall: Secondary | ICD-10-CM

## 2024-08-27 DIAGNOSIS — Z09 Encounter for follow-up examination after completed treatment for conditions other than malignant neoplasm: Secondary | ICD-10-CM

## 2024-08-27 DIAGNOSIS — S31109D Unspecified open wound of abdominal wall, unspecified quadrant without penetration into peritoneal cavity, subsequent encounter: Secondary | ICD-10-CM

## 2024-08-27 NOTE — Progress Notes (Signed)
 Patient seen in office for dressing and packing change to abdominal wall I&D x 2.   Packing removed from original incision to the left side of abdomen. Wound cleansed with Normal Saline. Mild bleeding noted with no purulent drainage. Wound noted to be healing well to the right end of the incision.   Lateral wound cleansed with Normal Saline.  Slight bleeding noted with no purulent drainage.    Approximately 2 of Iodoform 1/4 inch gauze packed into left sided incision.  Triple antibiotic ointment applied to lateral incision.    Mepilex 4 x 4 bandage x 2 applied and overlapped.

## 2024-08-30 ENCOUNTER — Other Ambulatory Visit

## 2024-08-31 ENCOUNTER — Other Ambulatory Visit: Payer: Self-pay

## 2024-08-31 ENCOUNTER — Ambulatory Visit (INDEPENDENT_AMBULATORY_CARE_PROVIDER_SITE_OTHER): Admitting: General Surgery

## 2024-08-31 ENCOUNTER — Encounter: Payer: Self-pay | Admitting: General Surgery

## 2024-08-31 VITALS — BP 111/75 | HR 72 | Temp 97.9°F | Resp 16 | Ht 69.0 in | Wt 243.0 lb

## 2024-08-31 DIAGNOSIS — L02211 Cutaneous abscess of abdominal wall: Secondary | ICD-10-CM

## 2024-08-31 DIAGNOSIS — Z09 Encounter for follow-up examination after completed treatment for conditions other than malignant neoplasm: Secondary | ICD-10-CM

## 2024-08-31 DIAGNOSIS — S31109D Unspecified open wound of abdominal wall, unspecified quadrant without penetration into peritoneal cavity, subsequent encounter: Secondary | ICD-10-CM

## 2024-08-31 NOTE — Progress Notes (Signed)
 Patient seen in office for dressing and packing change to abdominal wall I&D x 2.   Packing removed from original incision to the left side of abdomen. Wound cleansed with Normal Saline. Mild bleeding noted with no purulent drainage. Wound noted to be healing well to the right end of the incision.   Lateral wound cleansed with Normal Saline.  Slight bleeding noted with no purulent drainage.    Approximately 1 of Iodoform 1/4 inch gauze packed into left sided incision.  Triple antibiotic ointment applied to lateral incision.    Mepilex 4 x 4 bandage x 2 applied and overlapped.

## 2024-09-03 ENCOUNTER — Ambulatory Visit (INDEPENDENT_AMBULATORY_CARE_PROVIDER_SITE_OTHER): Admitting: General Surgery

## 2024-09-03 ENCOUNTER — Other Ambulatory Visit: Payer: Self-pay

## 2024-09-03 ENCOUNTER — Encounter: Payer: Self-pay | Admitting: General Surgery

## 2024-09-03 VITALS — BP 126/84 | HR 76 | Temp 97.7°F | Resp 16 | Ht 69.0 in | Wt 243.0 lb

## 2024-09-03 DIAGNOSIS — Z09 Encounter for follow-up examination after completed treatment for conditions other than malignant neoplasm: Secondary | ICD-10-CM

## 2024-09-03 NOTE — Progress Notes (Signed)
 Patient seen in office for dressing and packing change to abdominal wall I&D x 2.   Packing removed from original incision to the left side of abdomen. Wound cleansed with Normal Saline. Mild bleeding noted with no purulent drainage. Wound noted to be healing well to the right end of the incision.   Lateral wound cleansed with Normal Saline.  Slight bleeding noted with no purulent drainage.    Approximately 1 of Iodoform 1/4 inch gauze packed into left sided incision.  Triple antibiotic ointment applied to lateral incision.    Mepilex 4 x 4 bandage x 2 applied and overlapped.

## 2024-09-06 ENCOUNTER — Emergency Department (HOSPITAL_COMMUNITY)

## 2024-09-06 ENCOUNTER — Other Ambulatory Visit: Payer: Self-pay

## 2024-09-06 ENCOUNTER — Emergency Department (HOSPITAL_COMMUNITY)
Admission: EM | Admit: 2024-09-06 | Discharge: 2024-09-06 | Disposition: A | Discharge status: Home / Self Care | Attending: Emergency Medicine | Admitting: Emergency Medicine

## 2024-09-06 ENCOUNTER — Encounter (HOSPITAL_COMMUNITY): Payer: Self-pay

## 2024-09-06 DIAGNOSIS — R791 Abnormal coagulation profile: Secondary | ICD-10-CM | POA: Insufficient documentation

## 2024-09-06 DIAGNOSIS — J449 Chronic obstructive pulmonary disease, unspecified: Secondary | ICD-10-CM | POA: Diagnosis not present

## 2024-09-06 DIAGNOSIS — R0602 Shortness of breath: Secondary | ICD-10-CM | POA: Diagnosis not present

## 2024-09-06 DIAGNOSIS — Z8673 Personal history of transient ischemic attack (TIA), and cerebral infarction without residual deficits: Secondary | ICD-10-CM | POA: Insufficient documentation

## 2024-09-06 DIAGNOSIS — I509 Heart failure, unspecified: Secondary | ICD-10-CM | POA: Insufficient documentation

## 2024-09-06 DIAGNOSIS — R0789 Other chest pain: Secondary | ICD-10-CM | POA: Insufficient documentation

## 2024-09-06 DIAGNOSIS — R7989 Other specified abnormal findings of blood chemistry: Secondary | ICD-10-CM | POA: Diagnosis not present

## 2024-09-06 DIAGNOSIS — E119 Type 2 diabetes mellitus without complications: Secondary | ICD-10-CM | POA: Diagnosis not present

## 2024-09-06 DIAGNOSIS — Z7984 Long term (current) use of oral hypoglycemic drugs: Secondary | ICD-10-CM | POA: Insufficient documentation

## 2024-09-06 DIAGNOSIS — Z79899 Other long term (current) drug therapy: Secondary | ICD-10-CM | POA: Insufficient documentation

## 2024-09-06 DIAGNOSIS — M549 Dorsalgia, unspecified: Secondary | ICD-10-CM | POA: Diagnosis not present

## 2024-09-06 DIAGNOSIS — R079 Chest pain, unspecified: Secondary | ICD-10-CM | POA: Diagnosis not present

## 2024-09-06 DIAGNOSIS — I110 Hypertensive heart disease with heart failure: Secondary | ICD-10-CM | POA: Insufficient documentation

## 2024-09-06 LAB — CBC WITH DIFFERENTIAL/PLATELET
Abs Immature Granulocytes: 0.02 K/uL (ref 0.00–0.07)
Basophils Absolute: 0.1 K/uL (ref 0.0–0.1)
Basophils Relative: 1 %
Eosinophils Absolute: 0.4 K/uL (ref 0.0–0.5)
Eosinophils Relative: 5 %
HCT: 49.7 % — ABNORMAL HIGH (ref 36.0–46.0)
Hemoglobin: 17.5 g/dL — ABNORMAL HIGH (ref 12.0–15.0)
Immature Granulocytes: 0 %
Lymphocytes Relative: 27 %
Lymphs Abs: 2.2 K/uL (ref 0.7–4.0)
MCH: 33.3 pg (ref 26.0–34.0)
MCHC: 35.2 g/dL (ref 30.0–36.0)
MCV: 94.7 fL (ref 80.0–100.0)
Monocytes Absolute: 0.5 K/uL (ref 0.1–1.0)
Monocytes Relative: 6 %
Neutro Abs: 4.9 K/uL (ref 1.7–7.7)
Neutrophils Relative %: 61 %
Platelets: 192 K/uL (ref 150–400)
RBC: 5.25 MIL/uL — ABNORMAL HIGH (ref 3.87–5.11)
RDW: 12.3 % (ref 11.5–15.5)
WBC: 8 K/uL (ref 4.0–10.5)
nRBC: 0 % (ref 0.0–0.2)

## 2024-09-06 LAB — COMPREHENSIVE METABOLIC PANEL WITH GFR
ALT: 44 U/L (ref 0–44)
AST: 27 U/L (ref 15–41)
Albumin: 3.9 g/dL (ref 3.5–5.0)
Alkaline Phosphatase: 76 U/L (ref 38–126)
Anion gap: 12 (ref 5–15)
BUN: 25 mg/dL — ABNORMAL HIGH (ref 6–20)
CO2: 18 mmol/L — ABNORMAL LOW (ref 22–32)
Calcium: 9 mg/dL (ref 8.9–10.3)
Chloride: 112 mmol/L — ABNORMAL HIGH (ref 98–111)
Creatinine, Ser: 1.45 mg/dL — ABNORMAL HIGH (ref 0.44–1.00)
GFR, Estimated: 43 mL/min — ABNORMAL LOW (ref >60)
Glucose, Bld: 87 mg/dL (ref 70–99)
Potassium: 4.1 mmol/L (ref 3.5–5.1)
Sodium: 142 mmol/L (ref 135–145)
Total Bilirubin: 0.7 mg/dL (ref 0.0–1.2)
Total Protein: 6.6 g/dL (ref 6.5–8.1)

## 2024-09-06 LAB — BRAIN NATRIURETIC PEPTIDE: B Natriuretic Peptide: 49 pg/mL (ref 0.0–100.0)

## 2024-09-06 LAB — TROPONIN I (HIGH SENSITIVITY)
Troponin I (High Sensitivity): 5 ng/L (ref <18)
Troponin I (High Sensitivity): 5 ng/L (ref <18)

## 2024-09-06 LAB — D-DIMER, QUANTITATIVE: D-Dimer, Quant: 2.87 ug{FEU}/mL — ABNORMAL HIGH (ref 0.00–0.50)

## 2024-09-06 MED ORDER — IOHEXOL 350 MG/ML SOLN
75.0000 mL | Freq: Once | INTRAVENOUS | Status: AC | PRN
  Administered 2024-09-06: 75 mL via INTRAVENOUS

## 2024-09-06 MED ORDER — ACETAMINOPHEN 500 MG PO TABS
1000.0000 mg | ORAL_TABLET | Freq: Once | ORAL | Status: AC
  Administered 2024-09-06: 1000 mg via ORAL
  Filled 2024-09-06: qty 2

## 2024-09-06 NOTE — ED Notes (Signed)
 Patient transported to CT via w/c with cardiac monitoring in place

## 2024-09-06 NOTE — ED Triage Notes (Signed)
 Pt arrived via POV form home c/o left chest pain that radiates to her back and SOB that began this morning. Pt denies blood thinner.

## 2024-09-06 NOTE — Discharge Instructions (Addendum)
 Your workup today is reassuring. It is very unlikely that your pain is due to an issue in your heart.  Your cardiac enzyme (troponin) was normal today. Your EKG which is a measure of the heart's electrical activity and rhythm is normal today. These would both show abnormalities if you were having a heart attack.  No evidence of a blood clot in your lungs.  Your CT scan of the chest did show pulmonary nodule seen in the left side of your lung.  These are thought to be benign (not cancerous).  However, please make your PCP aware of these findings so that they can continue to monitor if needed.  Your chest x-ray is normal today.   No signs of heart failure exacerbation.  You may take up to 1000mg  of tylenol  every 6 hours as needed for pain.  Do not take more then 4g per day.  Please follow-up with your cardiologist as discussed on Thursday.  Return to the ER if you have any shortness of breath, difficulty breathing, worsening chest pain, dizziness, jaw pain, left arm or shoulder pain, abdominal pain, unexplained fever, any other new or concerning symptoms.

## 2024-09-06 NOTE — ED Provider Notes (Signed)
 [place] EMERGENCY DEPARTMENT AT [facility] Provider Note   CSN: [id number] Arrival date & time: 09/06/24  1013     Patient presents with: Chest Pain   Erin Sexton is a 53 y.o. female with history of CHF, COPD, type 2 diabetes, HOCM, hypertension, CVA, OSA, presents with concern for left-sided chest and shoulder pain that started this morning about 4 AM.  Reports this woke her up from sleep and pain is worse with moving her left arm.  The pain constant radiating to her back.  She denies any pain that worsens with exertion, no pleuritic chest pain.  No shortness of breath.  Does not feel dizzy, no headaches.  She denies any recent illnesses.  No significant increase in lower extremity swelling, no lower extremity pain.    Chest Pain      Prior to Admission medications   Medication Sig Start Date End Date Taking? Authorizing Provider  atorvastatin  (LIPITOR) 80 MG tablet Take 1 tablet (80 mg total) by mouth daily. Patient taking differently: Take 80 mg by mouth at bedtime. 04/26/24  Yes [name], MD  buPROPion  (WELLBUTRIN  XL) 300 MG 24 hr tablet Take 1 tablet (300 mg total) by mouth daily. 05/21/24  Yes [name], MD  Cholecalciferol (VITAMIN D3 PO) Take 10,000 Units by mouth daily.   Yes [provider]  clindamycin (CLEOCIN T) 1 % lotion Apply 1 Application topically 2 (two) times daily as needed (rash). 07/22/20  Yes [provider]  clotrimazole -betamethasone  (LOTRISONE ) cream APPLY  CREAM TOPICALLY TO AFFECTED AREA TWICE DAILY Patient taking differently: Apply 1 Application topically 2 (two) times daily. APPLY  CREAM TOPICALLY TO AFFECTED AREA TWICE DAILY 04/23/24  Yes [name], MD  empagliflozin  (JARDIANCE ) 10 MG TABS tablet Take 1 tablet (10 mg total) by mouth daily. 04/26/24  Yes [name], MD  estradiol  (ESTRACE ) 2 MG tablet TAKE 1 TABLET BY MOUTH AT BEDTIME 12/29/23  Yes Jayne Vonn DEL, MD  levocetirizine (XYZAL ) 5 MG  tablet TAKE 1 TABLET BY MOUTH ONCE DAILY IN THE EVENING 07/08/24  Yes [name], MD  levonorgestrel  (MIRENA ) 20 MCG/24HR IUD 1 each by Intrauterine route once. 04/03/16  Yes [provider]  levothyroxine  (SYNTHROID ) 112 MCG tablet Take 1 tablet by mouth once daily Patient taking differently: Take 112 mcg by mouth daily before breakfast. 08/16/24  Yes [name], MD  megestrol  (MEGACE ) 40 MG tablet Take 1 tablet (40 mg total) by mouth daily. Patient taking differently: Take 40 mg by mouth at bedtime. 11/11/23  Yes Jayne Vonn DEL, MD  metoprolol  tartrate (LOPRESSOR ) 50 MG tablet Take 1 tablet (50 mg total) by mouth 2 (two) times daily. 06/17/24  Yes [name], MD  Rimegepant Sulfate (NURTEC) 75 MG TBDP Take 1 tablet (75 mg total) by mouth as needed. Patient taking differently: Take 75 mg by mouth as needed (migraine). 01/09/24  Yes Lomax, Amy, NP  tirzepatide  (MOUNJARO ) 15 MG/0.5ML Pen INJECT 15 MG SUBCUTANEOUSLY  ONCE A WEEK Patient taking differently: Inject 15 mg into the skin every Thursday. 07/29/24  Yes [name], MD  topiramate  (TOPAMAX ) 100 MG tablet Take 1 tablet (100 mg total) by mouth daily. Patient taking differently: Take 100 mg by mouth at bedtime. 01/09/24  Yes Lomax, Amy, NP  verapamil  (CALAN -SR) 240 MG CR tablet Take 1 tablet (240 mg total) by mouth at bedtime. 08/02/24  Yes Mallipeddi, Vishnu P, MD  ACCU-CHEK GUIDE TEST test strip  USE 1 STRIP TO CHECK GLUCOSE ONCE DAILY 07/19/24   [name], MD  Accu-Chek Softclix Lancets lancets USE 1 LANCET TO CHECK GLUCOSE ONCE DAILY 08/08/21   Elnor Fairy HERO, NP  blood glucose meter kit and supplies KIT Dispense based on patient and insurance preference. Use once a day. Diagnosis code:E11.9 01/13/23   [name], MD    Allergies: Patient has no known allergies.    Review of Systems  Cardiovascular:  Positive for chest pain.    Updated Vital Signs BP (!) 140/78   Pulse 82   Temp 97.8 F (36.6 C) (Oral)    Resp 19   Ht 5' 9 (1.753 m)   Wt 109.7 kg   SpO2 100%   BMI 35.71 kg/m   Physical Exam Vitals and nursing note reviewed.  Constitutional:      General: She is not in acute distress.    Appearance: She is well-developed.  HENT:     Head: Normocephalic and atraumatic.  Eyes:     Conjunctiva/sclera: Conjunctivae normal.  Cardiovascular:     Rate and Rhythm: Normal rate and regular rhythm.     Heart sounds: No murmur heard.    Comments: 2+ radial pulses bilaterally Pulmonary:     Effort: Pulmonary effort is normal. No respiratory distress.     Breath sounds: Normal breath sounds.  Abdominal:     Palpations: Abdomen is soft.     Tenderness: There is no abdominal tenderness.  Musculoskeletal:        General: No swelling.     Cervical back: Neck supple.     Right lower leg: No edema.     Left lower leg: No edema.     Comments: No reproducible chest wall tenderness to palpation  No calf edema or tenderness to palpation bilaterally  Skin:    General: Skin is warm and dry.     Capillary Refill: Capillary refill takes less than 2 seconds.  Neurological:     Mental Status: She is alert.  Psychiatric:        Mood and Affect: Mood normal.     (all labs ordered are listed, but only abnormal results are displayed) Labs Reviewed  CBC WITH DIFFERENTIAL/PLATELET - Abnormal; Notable for the following components:      Result Value   RBC 5.25 (*)    Hemoglobin 17.5 (*)    HCT 49.7 (*)    All other components within normal limits  COMPREHENSIVE METABOLIC PANEL WITH GFR - Abnormal; Notable for the following components:   Chloride 112 (*)    CO2 18 (*)    BUN 25 (*)    Creatinine, Ser 1.45 (*)    GFR, Estimated 43 (*)    All other components within normal limits  D-DIMER, QUANTITATIVE (NOT AT [facility]) - Abnormal; Notable for the following components:   D-Dimer, Quant 2.87 (*)    All other components within normal limits  BRAIN NATRIURETIC PEPTIDE  TROPONIN I (HIGH SENSITIVITY)   TROPONIN I (HIGH SENSITIVITY)    EKG: EKG Interpretation Date/Time:  Monday September 06 2024 11:02:04 EDT Ventricular Rate:  73 PR Interval:  178 QRS Duration:  94 QT Interval:  376 QTC Calculation: 414 R Axis:   46  Text Interpretation: Normal sinus rhythm Septal infarct , age undetermined  nonspecific ST changes similar to priors Confirmed by Freddi Hamilton [phone]) on 09/06/2024 11:04:28 AM  Radiology: CT Angio Chest PE W and/or Wo Contrast Result Date: 09/06/2024 CLINICAL DATA:  Left-sided chest pain. EXAM: CT ANGIOGRAPHY CHEST WITH CONTRAST TECHNIQUE: Multidetector CT imaging of the chest was performed using the standard protocol during bolus administration of intravenous contrast. Multiplanar CT image reconstructions and MIPs were obtained to evaluate the vascular anatomy. RADIATION DOSE REDUCTION: This exam was performed according to the departmental dose-optimization program which includes automated exposure control, adjustment of the mA and/or kV according to patient size and/or use of iterative reconstruction technique. CONTRAST:  75mL OMNIPAQUE  IOHEXOL  350 MG/ML SOLN COMPARISON:  January 31, 2019 FINDINGS: Cardiovascular: Satisfactory opacification of the pulmonary arteries to the segmental level. No evidence of pulmonary embolism. Normal heart size. No pericardial effusion. Mediastinum/Nodes: No enlarged mediastinal, hilar, or axillary lymph nodes. Thyroid  gland, trachea, and esophagus demonstrate no significant findings. Lungs/Pleura: A stable, likely benign 4 mm pleural based posteromedial left upper lobe pulmonary nodule is seen (axial CT image 35, CT series 6). A stable, likely benign 3 mm pleural based posterolateral left basilar pulmonary nodule is also noted (axial CT image 96, CT series 6). No acute infiltrate, pleural effusion or pneumothorax is identified. Upper Abdomen: A layer of tiny gallstones is seen within the dependent portion of the gallbladder lumen.  Musculoskeletal: No chest wall abnormality. No acute or significant osseous findings. Review of the MIP images confirms the above findings. IMPRESSION: 1. No evidence of pulmonary embolism or other acute intrathoracic process. 2. Cholelithiasis. 3. Multiple stable, likely benign small left-sided pulmonary nodules, as described above. Electronically Signed   By: Suzen Dials M.D.   On: 09/06/2024 17:52   DG Chest 2 View Result Date: 09/06/2024 CLINICAL DATA:  Chest pain and shortness of breath with radiation of pain to back beginning this morning. EXAM: CHEST - 2 VIEW COMPARISON:  03/10/2020 FINDINGS: Lungs are adequately inflated without focal airspace consolidation or effusion. Cardiomediastinal silhouette and remainder of the exam is unchanged. IMPRESSION: No active cardiopulmonary disease. Electronically Signed   By: Toribio Agreste M.D.   On: 09/06/2024 11:26     Procedures   Medications Ordered in the ED  acetaminophen  (TYLENOL ) tablet 1,000 mg (1,000 mg Oral Given 09/06/24 1641)  iohexol  (OMNIPAQUE ) 350 MG/ML injection 75 mL (75 mLs Intravenous Contrast Given 09/06/24 1730)    Clinical Course as of 09/06/24 1810  Mon Sep 06, 2024  1634 D-Dimer, Quant(!): 2.87 Will obtain CTA chest  [AF]    Clinical Course User Index [AF] Veta Palma, PA-C                                 Medical Decision Making Amount and/or Complexity of Data Reviewed Labs: ordered. Decision-making details documented in ED Course. Radiology: ordered.  Risk OTC drugs. Prescription drug management.     Differential diagnosis includes but is not limited to ACS, arrhythmia, aortic aneurysm, aortic dissection, pericarditis, myocarditis, pericardial effusion, cardiac tamponade, musculoskeletal pain, GERD, Boerhaave's syndrome, DVT/PE, pneumonia, pleural effusion   ED Course:  Upon initial evaluation, patient is very well-appearing, no acute distress.  Stable vitals.  Reporting left chest and shoulder  pain.   Labs Ordered: I Ordered, and personally interpreted labs.  The pertinent results include:   CBC with no leukocytosis.  Elevated hemoglobin at 17.5 consistent with patient's baseline. CMP with elevated creatinine 1.45, baseline.  Otherwise unremarkable Initial and repeat troponin within normal limits BMP within normal limits D-dimer elevated 2.87  Imaging Studies ordered: I ordered imaging studies including chest x-ray I independently visualized the imaging with scope  of interpretation limited to determining acute life threatening conditions related to emergency care. Imaging showed no acute abnormalities I agree with the radiologist interpretation   Cardiac Monitoring: / EKG: The patient was maintained on a cardiac monitor.  I personally viewed and interpreted the cardiac monitored which showed an underlying rhythm of: Normal sinus rhythm   Medications Given: Tylenol   Upon re-evaluation, patient remains well appearing with stable vitals.  Reports Tylenol  has improved her pain.  Low concern for ACS at this time given troponin remains stable with initial troponin of 5 and repeat of 5, pain non-exertional, and EKG with normal sinus rhythm and no ST changes. Chest x-ray without any acute abnormality. BNP within normal limits, no significant lower extremity edema, no pulmonary edema on chest x-ray, low concern for CHF exacerbation at this time. D dimer was elevated so CTA chest was obtained for PE rule out. Low concern for DVT or PE at this time CTA chest without evidence of PE. She reports that pain radiates to the back, but hemodynamically stable for over 12 hours, 2+ radial pulse bilaterally, no widened mediastinum, do not feel this is consistent with vascular dissection. Doubt pericarditis or myocarditis given no recent illness, no elevated troponin. Pain is worsened with range of motion of the left shoulder, question musculoskeletal etiology although she denies any known  injury.  Stable and appropriate for discharge home  Impression: Atypical chest pain  Disposition:  The patient was discharged home with instructions to attend her cardiology appointment on 09/09/2024 for echocardiogram and further evaluation. Return precautions given and patient verbalized understanding    This chart was dictated using voice recognition software, Dragon. Despite the best efforts of this provider to proofread and correct errors, errors may still occur which can change documentation meaning.       Final diagnoses:  Atypical chest pain    ED Discharge Orders     None          Veta Palma, DEVONNA 09/06/24 1810    Yolande Lamar BROCKS, MD 09/11/24 2130

## 2024-09-07 ENCOUNTER — Other Ambulatory Visit: Payer: Self-pay

## 2024-09-07 ENCOUNTER — Ambulatory Visit (INDEPENDENT_AMBULATORY_CARE_PROVIDER_SITE_OTHER): Admitting: Surgery

## 2024-09-07 ENCOUNTER — Encounter: Payer: Self-pay | Admitting: Surgery

## 2024-09-07 VITALS — BP 114/65 | HR 74 | Temp 98.3°F | Resp 18 | Ht 69.0 in | Wt 243.0 lb

## 2024-09-07 DIAGNOSIS — S31109D Unspecified open wound of abdominal wall, unspecified quadrant without penetration into peritoneal cavity, subsequent encounter: Secondary | ICD-10-CM

## 2024-09-07 NOTE — Progress Notes (Signed)
 Patient seen in office for dressing and packing change to abdominal wall I&D x 2.   Packing removed from original incision to the left side of abdomen. Wound cleansed with Normal Saline. Mild bleeding noted with no purulent drainage. Wound noted to be healing well to the right end of the incision.   Lateral wound cleansed with Normal Saline.  Slight bleeding noted with no purulent drainage. Some granuloma tissue noted to lateral wound.    Unable to pack Iodoform gauze into left sided incision. Dr. Kallie available for evaluation.   Recommended to apply silver  nitrate to granuloma tissues on both original and lateral incision. Advised to apply triple antibiotic ointment to both wounds and cover with Mepilex 4 x 4 bandage.  Patient to follow up for nurse visit for wound care on Friday, 09/10/2024.

## 2024-09-08 ENCOUNTER — Encounter: Payer: Self-pay | Admitting: Internal Medicine

## 2024-09-09 ENCOUNTER — Ambulatory Visit: Attending: Internal Medicine

## 2024-09-09 DIAGNOSIS — I421 Obstructive hypertrophic cardiomyopathy: Secondary | ICD-10-CM | POA: Diagnosis not present

## 2024-09-09 MED ORDER — PERFLUTREN LIPID MICROSPHERE
1.0000 mL | INTRAVENOUS | Status: AC | PRN
  Administered 2024-09-09: 4 mL via INTRAVENOUS

## 2024-09-10 ENCOUNTER — Other Ambulatory Visit: Payer: Self-pay

## 2024-09-10 ENCOUNTER — Encounter: Payer: Self-pay | Admitting: Surgery

## 2024-09-10 ENCOUNTER — Ambulatory Visit (INDEPENDENT_AMBULATORY_CARE_PROVIDER_SITE_OTHER): Admitting: Surgery

## 2024-09-10 ENCOUNTER — Encounter: Admitting: Surgery

## 2024-09-10 VITALS — BP 128/83 | HR 98 | Temp 97.8°F | Resp 16 | Ht 69.0 in | Wt 243.0 lb

## 2024-09-10 DIAGNOSIS — S31109D Unspecified open wound of abdominal wall, unspecified quadrant without penetration into peritoneal cavity, subsequent encounter: Secondary | ICD-10-CM

## 2024-09-10 NOTE — Progress Notes (Signed)
 Patient seen in office for wound check to abdominal wall I&D x 2.   Wounds cleansed with Normal Saline. No bleeding or purulent drainage noted. Some granuloma tissue continues to be noted to lateral wound.    Silver  nitrate applied to granuloma tissue. Advised to apply triple antibiotic ointment to both wounds and cover with Mepilex 4 x 4 bandage.   Patient to follow up with Dr. Evonnie on 09/21/2024.

## 2024-09-13 LAB — ECHOCARDIOGRAM LIMITED
AV Peak grad: 21.3 mmHg
Ao pk vel: 2.31 m/s
Area-P 1/2: 2.7 cm2
Calc EF: 74.3 %
S' Lateral: 2.1 cm
Single Plane A2C EF: 65.1 %
Single Plane A4C EF: 82.3 %

## 2024-09-17 ENCOUNTER — Ambulatory Visit: Payer: Self-pay | Admitting: Internal Medicine

## 2024-09-17 DIAGNOSIS — Z23 Encounter for immunization: Secondary | ICD-10-CM | POA: Diagnosis not present

## 2024-09-20 NOTE — Telephone Encounter (Signed)
 The patient has been notified of the result and verbalized understanding.  All questions (if any) were answered. Littie CHRISTELLA Croak, CMA 09/20/2024 4:57 PM

## 2024-09-20 NOTE — Telephone Encounter (Signed)
-----   Message from Vishnu P Mallipeddi sent at 09/17/2024 11:48 AM EDT ----- Known HOCM.  LVOT gradient is 21 mmHg at rest and 40 mm with Valsalva.  Prior study reported peak LVOT gradient at rest 23 mmHg and 77 mmHg with Valsalva.  Pressure gradients improved compared to  prior study.  Continue current medications. ----- Message ----- From: Interface, [employer] One [employer] Sent: 09/13/2024   7:51 AM EDT To: Vishnu P Mallipeddi, MD

## 2024-09-21 ENCOUNTER — Encounter: Payer: Self-pay | Admitting: Surgery

## 2024-09-21 ENCOUNTER — Ambulatory Visit (INDEPENDENT_AMBULATORY_CARE_PROVIDER_SITE_OTHER): Admitting: Surgery

## 2024-09-21 ENCOUNTER — Ambulatory Visit: Admitting: Genetic Counselor

## 2024-09-21 VITALS — BP 123/81 | HR 75 | Temp 98.3°F | Resp 16 | Ht 69.0 in | Wt 242.0 lb

## 2024-09-21 DIAGNOSIS — S31109D Unspecified open wound of abdominal wall, unspecified quadrant without penetration into peritoneal cavity, subsequent encounter: Secondary | ICD-10-CM

## 2024-09-21 NOTE — Progress Notes (Unsigned)
 [facility] Note   HPI:  53 y.o. Female presents to clinic for wound recheck at location of 2 previous office incision and drainage procedures for abdominal abscess.  Since her last visit with me, the areas have completely healed over.  She is not putting anything on them anymore.  She denies any pain in this area.  Denies fevers and chills.  Review of Systems:  All other review of systems: otherwise negative   Vital Signs:  BP 123/81   Pulse 75   Temp 98.3 F (36.8 C) (Oral)   Resp 16   Ht 5' 9 (1.753 m)   Wt 242 lb (109.8 kg)   SpO2 98%   BMI 35.74 kg/m    Physical Exam:  Physical Exam Vitals reviewed.  Constitutional:      Appearance: Normal appearance.  Abdominal:     Comments: Abdomen soft, nondistended, no percussion tenderness, nontender to palpation; left upper quadrant I&D sites healing well without any erythema or induration  Neurological:     Mental Status: She is alert.     Laboratory studies: None  Imaging:  None  Assessment:  53 y.o. yo Female who presents for wound recheck status post to previous in office incision and drainage is for abdominal abscess.  Plan:  - Areas have been healing well and there are no concerns for recurrent infection at this time - Patient expressed that she was scheduled to undergo hernia repair with panniculectomy.  I advised that these areas should heal for at least an additional month prior to scheduling any other surgeries - Patient plans to talk with her previous physicians and make a decision if she wants to proceed with panniculectomy.  If she would like to proceed with just ventral hernia repair without panniculectomy, she can follow-up with me to discuss ventral hernia repair - Follow up with me in 1-1/2 months  All of the above recommendations were discussed with the patient, and all of patient's questions were answered to her expressed satisfaction.  Note: Portions of this report may have been  transcribed using voice recognition software. Every effort has been made to ensure accuracy; however, inadvertent computerized transcription errors may still be present.   Dorothyann Brittle, DO [facility] [address] [place] [address] [place], [place] [address] [phone] (office)

## 2024-09-30 ENCOUNTER — Other Ambulatory Visit: Payer: Self-pay | Admitting: Internal Medicine

## 2024-09-30 DIAGNOSIS — J4540 Moderate persistent asthma, uncomplicated: Secondary | ICD-10-CM

## 2024-10-04 ENCOUNTER — Ambulatory Visit (INDEPENDENT_AMBULATORY_CARE_PROVIDER_SITE_OTHER): Payer: Medicare Other

## 2024-10-04 VITALS — BP 135/78 | HR 74 | Resp 14 | Ht 69.0 in | Wt 248.1 lb

## 2024-10-04 DIAGNOSIS — Z0000 Encounter for general adult medical examination without abnormal findings: Secondary | ICD-10-CM | POA: Diagnosis not present

## 2024-10-04 NOTE — Progress Notes (Signed)
 [name] is a 53 y.o. who presents for a Medicare Wellness preventive visit.  As a reminder, Annual Wellness Visits don't include a physical exam, and some assessments may be limited, especially if this visit is performed virtually. We may recommend an in-person follow-up visit with your provider if needed.  Visit Complete: In person  Persons Participating in Visit: Patient.  AWV Questionnaire: Yes: Patient Medicare AWV questionnaire was completed by the patient on 10/02/2024; I have confirmed that all information answered by patient is correct and no changes since this date.  Cardiac Risk Factors include: diabetes mellitus;dyslipidemia;hypertension;obesity (BMI >30kg/m2)     Objective:    Today's Vitals   10/04/24 0829  BP: 135/78  Pulse: 74  Resp: 14  SpO2: 97%  Weight: 248 lb 1.3 oz (112.5 kg)  Height: 5' 9 (1.753 m)  PainSc: 0-No pain   Body mass index is 36.64 kg/m.     10/04/2024    8:28 AM 09/06/2024   11:01 AM 10/01/2023    8:04 AM 09/03/2022    2:41 PM 07/21/2022   10:31 AM 08/27/2021   10:38 AM 05/28/2020   10:10 AM  Advanced Directives  Does Patient Have a Medical Advance Directive? Yes No Yes No No Yes Yes  Type of Estate Agent of [place];Living will  Healthcare Power of [place];Living will   Living will;Healthcare Power of Attorney Living will  Does patient want to make changes to medical advance directive? No - Patient declined  No - Patient declined      Copy of Healthcare Power of Attorney in Chart? Yes - validated most recent copy scanned in chart (See row information)  Yes - validated most recent copy scanned in chart (See row information)   No - copy requested   Would patient like information on creating a medical advance directive?  No - Patient declined  No - Patient declined       Current Medications (verified) Outpatient Encounter Medications as of 10/04/2024  Medication Sig   ACCU-CHEK GUIDE TEST  test strip USE 1 STRIP TO CHECK GLUCOSE ONCE DAILY   Accu-Chek Softclix Lancets lancets USE 1 LANCET TO CHECK GLUCOSE ONCE DAILY   atorvastatin  (LIPITOR) 80 MG tablet Take 1 tablet (80 mg total) by mouth daily. (Patient taking differently: Take 80 mg by mouth at bedtime.)   blood glucose meter kit and supplies KIT Dispense based on patient and insurance preference. Use once a day. Diagnosis code:E11.9   buPROPion  (WELLBUTRIN  XL) 300 MG 24 hr tablet Take 1 tablet (300 mg total) by mouth daily.   Cholecalciferol (VITAMIN D3 PO) Take 10,000 Units by mouth daily.   clindamycin (CLEOCIN T) 1 % lotion Apply 1 Application topically 2 (two) times daily as needed (rash).   clotrimazole -betamethasone  (LOTRISONE ) cream APPLY  CREAM TOPICALLY TO AFFECTED AREA TWICE DAILY (Patient taking differently: Apply 1 Application topically 2 (two) times daily. APPLY  CREAM TOPICALLY TO AFFECTED AREA TWICE DAILY)   empagliflozin  (JARDIANCE ) 10 MG TABS tablet Take 1 tablet (10 mg total) by mouth daily.   estradiol  (ESTRACE ) 2 MG tablet TAKE 1 TABLET BY MOUTH AT BEDTIME   levocetirizine (XYZAL ) 5 MG tablet TAKE 1 TABLET BY MOUTH ONCE DAILY IN THE EVENING   levonorgestrel  (MIRENA ) 20 MCG/24HR IUD 1 each by Intrauterine route once.   levothyroxine  (SYNTHROID ) 112 MCG tablet Take 1 tablet by mouth once daily (Patient taking differently: Take 112 mcg by mouth daily before breakfast.)   megestrol  (  MEGACE ) 40 MG tablet Take 1 tablet (40 mg total) by mouth daily. (Patient taking differently: Take 40 mg by mouth at bedtime.)   metoprolol  tartrate (LOPRESSOR ) 50 MG tablet Take 1 tablet (50 mg total) by mouth 2 (two) times daily.   Rimegepant Sulfate (NURTEC) 75 MG TBDP Take 1 tablet (75 mg total) by mouth as needed. (Patient taking differently: Take 75 mg by mouth as needed (migraine).)   tirzepatide  (MOUNJARO ) 15 MG/0.5ML Pen INJECT 15 MG SUBCUTANEOUSLY  ONCE A WEEK (Patient taking differently: Inject 15 mg into the skin every  Thursday.)   topiramate  (TOPAMAX ) 100 MG tablet Take 1 tablet (100 mg total) by mouth daily. (Patient taking differently: Take 100 mg by mouth at bedtime.)   verapamil  (CALAN -SR) 240 MG CR tablet Take 1 tablet (240 mg total) by mouth at bedtime.   No facility-administered encounter medications on file as of 10/04/2024.    Allergies (verified) Patient has no known allergies.   History: Past Medical History:  Diagnosis Date   Abnormal CT scan    a. 02/2014: evaluated for dyspnea and initially placed on Xarelto  for PE because radiologist could not exclude small peripheral pulmonary emboli because of limited contrast. However, after admission for ++vaginal bleeding several days later, it was felt she did NOT have PE - underwent further testing with neg VQ 03/06/14, neg LE duplex.   Anemia    Anxiety    Arthritis    Asthma    Blood transfusion without reported diagnosis    Bone spur of right foot    Cataract 11/2022   CHF (congestive heart failure) (HCC)    COPD (chronic obstructive pulmonary disease) (HCC)    Depression    Diabetes mellitus without complication (HCC)    a. Dx 02/2014.   Heart murmur    History of echocardiogram 2015   normal EF, Grade II diastolic dysfunction   HOCM (hypertrophic obstructive cardiomyopathy) (HCC)    a. Dx 67984.   Hyperlipidemia    Hypertension    Morbid obesity (HCC)    NSVT (nonsustained ventricular tachycardia) (HCC)    a. Noted on tele during 02/2014 adm for symptomatic anemia.   Stroke [facility])    SVT (supraventricular tachycardia)    a. Noted on tele during 02/2014 adm for symptomatic anemia.   Thyroid  disease    Vaginal bleeding    Past Surgical History:  Procedure Laterality Date   COLONOSCOPY N/A 09/30/2019   Procedure: COLONOSCOPY;  Surgeon: Golda Claudis PENNER, MD;  Location: [facility];  Service: Endoscopy;  Laterality: N/A;  730   DILITATION & CURRETTAGE/HYSTROSCOPY WITH NOVASURE ABLATION N/A 08/07/2016   Procedure: DILATATION &  CURETTAGE/HYSTEROSCOPY WITH ATTPEMPTED NOVASURE ABLATION AND INSERTION OF IUD;  Surgeon: Vonn VEAR Inch, MD;  Location: [facility] ORS;  Service: Gynecology;  Laterality: N/A;   Family History  Problem Relation Age of Onset   Diabetes Mother    Arthritis Mother    Other Mother        WPW   Mental illness Mother        Depression   Asthma Mother    Obesity Mother    Heart disease Father        History of coronary bypass grafting at age 60   Hypertension Father    Mental illness Sister    Bipolar disorder Sister    Heart disease Sister        possibly WPW   Depression Sister    Varicose Veins Sister  Uterine cancer Maternal Grandmother    Mental illness Maternal Grandmother    Arthritis Maternal Grandmother    Hyperlipidemia Maternal Grandmother    Heart disease Maternal Grandfather    CAD Neg Hx        No family history of EARLY CAD   Social History   Socioeconomic History   Marital status: Divorced    Spouse name: Not on file   Number of children: 0   Years of education: Not on file   Highest education level: Associate degree: occupational, scientist, product/process development, or vocational program  Occupational History   Occupation: Disabled  Tobacco Use   Smoking status: Never   Smokeless tobacco: Never  Vaping Use   Vaping status: Never Used  Substance and Sexual Activity   Alcohol use: Never   Drug use: Never   Sexual activity: Not Currently    Birth control/protection: I.U.D., Post-menopausal    Comment: IUD in place to control vaginal bleeding  Other Topics Concern   Not on file  Social History Narrative   Divorced for 3 years,married for 5 years.On disability since 2009 secondary to mental illness.   Social Drivers of Corporate Investment Banker Strain: Low Risk  (10/04/2024)   Overall Financial Resource Strain (CARDIA)    Difficulty of Paying Living Expenses: Not very hard  Food Insecurity: No Food Insecurity (10/04/2024)   Hunger Vital Sign    Worried About Running Out of Food  in the Last Year: Never true    Ran Out of Food in the Last Year: Never true  Transportation Needs: No Transportation Needs (10/04/2024)   PRAPARE - Administrator, Civil Service (Medical): No    Lack of Transportation (Non-Medical): No  Physical Activity: Insufficiently Active (10/04/2024)   Exercise Vital Sign    Days of Exercise per Week: 3 days    Minutes of Exercise per Session: 30 min  Stress: Stress Concern Present (10/04/2024)   [employer] of Occupational Health - Occupational Stress Questionnaire    Feeling of Stress: To some extent  Social Connections: Socially Isolated (10/04/2024)   Social Connection and Isolation Panel    Frequency of Communication with Friends and Family: More than three times a week    Frequency of Social Gatherings with Friends and Family: Never    Attends Religious Services: Never    Database Administrator or Organizations: No    Attends Engineer, Structural: Not on file    Marital Status: Divorced    Tobacco Counseling Counseling given: Not Answered    Clinical Intake:  Pre-visit preparation completed: Yes  Pain : No/denies pain Pain Score: 0-No pain     BMI - recorded: 36.64 Nutritional Status: BMI > 30  Obese Nutritional Risks: None Diabetes: Yes CBG done?: No Did pt. bring in CBG monitor from home?: No  Lab Results  Component Value Date   HGBA1C 4.8 06/17/2024   HGBA1C 5.2 02/16/2024   HGBA1C 5.2 10/17/2023     How often do you need to have someone help you when you read instructions, pamphlets, or other written materials from your doctor or pharmacy?: 1 - Never  Interpreter Needed?: No  Information entered by :: Alaa Mullally W CMA (AAMA)   Activities of Daily Living     10/02/2024   10:10 AM  In your present state of health, do you have any difficulty performing the following activities:  Hearing? 0  Vision? 0  Difficulty concentrating or making decisions? 0  Walking or  climbing stairs? 0   Dressing or bathing? 0  Doing errands, shopping? 0  Preparing Food and eating ? N  Using the Toilet? N  In the past six months, have you accidently leaked urine? N  Do you have problems with loss of bowel control? N  Managing your Medications? N  Managing your Finances? N  Housekeeping or managing your Housekeeping? N    Patient Care Team: [name], MD as PCP - General (Internal Medicine) Stacia Diannah SQUIBB, MD as PCP - Cardiology (Cardiology) Jacinto Lonni PARAS, [facility] (Inactive) (Pharmacist)  I have updated your Care Teams any recent Medical Services you may have received from other providers in the past year.     Assessment:   This is a routine wellness examination for Erin Sexton.  Hearing/Vision screen Hearing Screening - Comments:: Patient denies any hearing difficulties.   Vision Screening - Comments:: Wears rx glasses - up to date with routine eye exams with  Oneil Kawasaki   Goals Addressed               This Visit's Progress     I want to have a panniculectomy (pt-stated)          Depression Screen     10/04/2024    8:34 AM 06/17/2024   10:15 AM 02/16/2024   10:05 AM 10/17/2023    9:51 AM 10/01/2023    8:10 AM 06/16/2023   10:34 AM 02/10/2023    2:51 PM  PHQ 2/9 Scores  PHQ - 2 Score 2 0 0 2 1 2 4   PHQ- 9 Score 3 0 0 2 1 5 7      Fall Risk     10/02/2024   10:10 AM 09/07/2024   10:46 AM 09/03/2024   10:50 AM 08/31/2024   10:08 AM 08/27/2024   10:25 AM  Fall Risk   Falls in the past year? 0 0 0 0 0  Number falls in past yr: 0 0 0 0 0  Injury with Fall? 0 0 0 0 0  Risk for fall due to : No Fall Risks No Fall Risks No Fall Risks No Fall Risks   Follow up Falls evaluation completed;Education provided;Falls prevention discussed Falls evaluation completed Falls evaluation completed Falls evaluation completed     MEDICARE RISK AT HOME:  Medicare Risk at Home Any stairs in or around the home?: (Patient-Rptd) Yes If so, are there any without  handrails?: (Patient-Rptd) No Home free of loose throw rugs in walkways, pet beds, electrical cords, etc?: (Patient-Rptd) Yes Adequate lighting in your home to reduce risk of falls?: (Patient-Rptd) Yes Life alert?: (Patient-Rptd) No Use of a cane, walker or w/c?: (Patient-Rptd) No Grab bars in the bathroom?: (Patient-Rptd) Yes Shower chair or bench in shower?: (Patient-Rptd) Yes Elevated toilet seat or a handicapped toilet?: (Patient-Rptd) No  TIMED UP AND GO:  Was the test performed?  Yes  Length of time to ambulate 10 feet: 5 sec Gait steady and fast without use of assistive device  Cognitive Function: 6CIT completed    09/03/2022    2:42 PM  MMSE - Mini Mental State Exam  Not completed: Unable to complete        10/04/2024    8:34 AM 10/01/2023    8:08 AM 09/03/2022    2:42 PM 08/27/2021   10:42 AM 12/29/2019    9:34 AM  6CIT Screen  What Year? 0 points 0 points 0 points 0 points 0 points  What month? 0 points 0  points 0 points 0 points 0 points  What time? 0 points 0 points 0 points 0 points 0 points  Count back from 20 0 points 0 points 0 points 0 points 0 points  Months in reverse 0 points 0 points 0 points 0 points 0 points  Repeat phrase 0 points 0 points 0 points 0 points 0 points  Total Score 0 points 0 points 0 points 0 points 0 points    Immunizations Immunization History  Administered Date(s) Administered   Influenza Inj Mdck Quad Pf 09/11/2016, 09/08/2018   Influenza, Quadrivalent, Recombinant, Inj, Pf 09/15/2019   Influenza, Seasonal, Injecte, Preservative Fre 10/06/2023, 09/17/2024   Influenza,inj,Quad PF,6+ Mos 10/15/2022   Influenza-Unspecified 09/09/2019, 08/27/2021   PNEUMOCOCCAL CONJUGATE-20 02/16/2024   Pneumococcal Polysaccharide-23 08/10/2015, 09/27/2020   Pneumococcal-Unspecified 08/25/2013   Td 12/09/2014    Screening Tests Health Maintenance  Topic Date Due   Hepatitis B Vaccines 19-59 Average Risk (1 of 3 - 19+ 3-dose series) Never done    Zoster Vaccines- Shingrix (1 of 2) Never done   Diabetic kidney evaluation - Urine ACR  10/16/2024   OPHTHALMOLOGY EXAM  12/08/2024   DTaP/Tdap/Td (2 - Tdap) 12/09/2024   HEMOGLOBIN A1C  12/18/2024   FOOT EXAM  06/17/2025   Mammogram  08/18/2025   Diabetic kidney evaluation - eGFR measurement  09/06/2025   Medicare Annual Wellness (AWV)  10/04/2025   Cervical Cancer Screening (HPV/Pap Cotest)  05/08/2026   Colonoscopy  09/29/2029   Pneumococcal Vaccine: 50+ Years  Completed   Influenza Vaccine  Completed   Hepatitis C Screening  Completed   HIV Screening  Completed   HPV VACCINES  Aged Out   Meningococcal B Vaccine  Aged Out   COVID-19 Vaccine  Discontinued    Health Maintenance Health Maintenance Due  Topic Date Due   Hepatitis B Vaccines 19-59 Average Risk (1 of 3 - 19+ 3-dose series) Never done   Zoster Vaccines- Shingrix (1 of 2) Never done   Diabetic kidney evaluation - Urine ACR  10/16/2024   Health Maintenance Items Addressed: Vaccines Due: Hep B, Shingrix  Additional Screening:  Vision Screening: Recommended annual ophthalmology exams for early detection of glaucoma and other disorders of the eye. Would you like a referral to an eye doctor? No    Dental Screening: Recommended annual dental exams for proper oral hygiene  Community Resource Referral / Chronic Care Management: CRR required this visit?  No   CCM required this visit?  No   Plan:    I have personally reviewed and noted the following in the patient's chart:   Medical and social history Use of alcohol, tobacco or illicit drugs  Current medications and supplements including opioid prescriptions. Patient is not currently taking opioid prescriptions. Functional ability and status Nutritional status Physical activity Advanced directives List of other physicians Hospitalizations, surgeries, and ER visits in previous 12 months Vitals Screenings to include cognitive, depression, and  falls Referrals and appointments  In addition, I have reviewed and discussed with patient certain preventive protocols, quality metrics, and best practice recommendations. A written personalized care plan for preventive services as well as general preventive health recommendations were provided to patient.   Caetano Oberhaus, CMA   10/04/2024   After Visit Summary: (MyChart) Due to this being a telephonic visit, the after visit summary with patients personalized plan was offered to patient via MyChart   Notes: Nothing significant to report at this time.

## 2024-10-04 NOTE — Patient Instructions (Signed)
 Erin Sexton,  Thank you for taking the time for your Medicare Wellness Visit. I appreciate your continued commitment to your health goals. Please review the care plan we discussed, and feel free to reach out if I can assist you further.  Medicare recommends these wellness visits once per year to help you and your care team stay ahead of potential health issues. These visits are designed to focus on prevention, allowing your provider to concentrate on managing your acute and chronic conditions during your regular appointments.  Please note that Annual Wellness Visits do not include a physical exam. Some assessments may be limited, especially if the visit was conducted virtually. If needed, we may recommend a separate in-person follow-up with your provider.  Wishing you excellent health and many blessings in the year to come!  -Lilas Diefendorf, CMA  Ongoing Care Seeing your primary care provider every 3 to 6 months helps us  monitor your health and provide consistent, personalized care.   Recommended Screenings:  Health Maintenance  Topic Date Due   Hepatitis B Vaccine (1 of 3 - 19+ 3-dose series) Never done   Zoster (Shingles) Vaccine (1 of 2) Never done   Yearly kidney health urinalysis for diabetes  10/16/2024   Eye exam for diabetics  12/08/2024   DTaP/Tdap/Td vaccine (2 - Tdap) 12/09/2024   Hemoglobin A1C  12/18/2024   Complete foot exam   06/17/2025   Breast Cancer Screening  08/18/2025   Yearly kidney function blood test for diabetes  09/06/2025   Medicare Annual Wellness Visit  10/04/2025   Pap with HPV screening  05/08/2026   Colon Cancer Screening  09/29/2029   Pneumococcal Vaccine for age over 77  Completed   Flu Shot  Completed   Hepatitis C Screening  Completed   HIV Screening  Completed   HPV Vaccine  Aged Out   Meningitis B Vaccine  Aged Out   COVID-19 Vaccine  Discontinued       10/04/2024    8:28 AM  Advanced Directives  Does Patient Have a Medical Advance Directive?  Yes  Type of Estate Agent of [place];Living will  Does patient want to make changes to medical advance directive? No - Patient declined  Copy of Healthcare Power of Attorney in Chart? Yes - validated most recent copy scanned in chart (See row information)   Advance Care Planning is important because it: Ensures you receive medical care that aligns with your values, goals, and preferences. Provides guidance to your family and loved ones, reducing the emotional burden of decision-making during critical moments.  Vision: Annual vision screenings are recommended for early detection of glaucoma, cataracts, and diabetic retinopathy. These exams can also reveal signs of chronic conditions such as diabetes and high blood pressure.  Dental: Annual dental screenings help detect early signs of oral cancer, gum disease, and other conditions linked to overall health, including heart disease and diabetes.  Please see the attached documents for additional preventive care recommendations.

## 2024-10-06 ENCOUNTER — Ambulatory Visit: Admitting: Internal Medicine

## 2024-10-19 ENCOUNTER — Encounter: Payer: Self-pay | Admitting: Internal Medicine

## 2024-10-19 ENCOUNTER — Ambulatory Visit (INDEPENDENT_AMBULATORY_CARE_PROVIDER_SITE_OTHER): Admitting: Internal Medicine

## 2024-10-19 VITALS — BP 126/84 | HR 73 | Ht 69.0 in | Wt 246.8 lb

## 2024-10-19 DIAGNOSIS — I5030 Unspecified diastolic (congestive) heart failure: Secondary | ICD-10-CM

## 2024-10-19 DIAGNOSIS — E039 Hypothyroidism, unspecified: Secondary | ICD-10-CM | POA: Diagnosis not present

## 2024-10-19 DIAGNOSIS — E65 Localized adiposity: Secondary | ICD-10-CM

## 2024-10-19 DIAGNOSIS — N1831 Chronic kidney disease, stage 3a: Secondary | ICD-10-CM

## 2024-10-19 DIAGNOSIS — J4540 Moderate persistent asthma, uncomplicated: Secondary | ICD-10-CM

## 2024-10-19 DIAGNOSIS — Z7985 Long-term (current) use of injectable non-insulin antidiabetic drugs: Secondary | ICD-10-CM | POA: Diagnosis not present

## 2024-10-19 DIAGNOSIS — I10 Essential (primary) hypertension: Secondary | ICD-10-CM

## 2024-10-19 DIAGNOSIS — I110 Hypertensive heart disease with heart failure: Secondary | ICD-10-CM

## 2024-10-19 DIAGNOSIS — E1159 Type 2 diabetes mellitus with other circulatory complications: Secondary | ICD-10-CM | POA: Diagnosis not present

## 2024-10-19 DIAGNOSIS — E1122 Type 2 diabetes mellitus with diabetic chronic kidney disease: Secondary | ICD-10-CM | POA: Diagnosis not present

## 2024-10-19 DIAGNOSIS — E1169 Type 2 diabetes mellitus with other specified complication: Secondary | ICD-10-CM

## 2024-10-19 DIAGNOSIS — I421 Obstructive hypertrophic cardiomyopathy: Secondary | ICD-10-CM

## 2024-10-19 NOTE — Assessment & Plan Note (Signed)
 BMI Readings from Last 3 Encounters:  10/19/24 36.45 kg/m  10/04/24 36.64 kg/m  09/21/24 35.74 kg/m   Associated with DM and HTN Continue low-carb diet and moderate exercise as tolerated On Mounjaro  for type 2 DM

## 2024-10-19 NOTE — Assessment & Plan Note (Addendum)
 BP Readings from Last 1 Encounters:  10/19/24 126/84   Well-controlled with Metoprolol  and Verapamil  Lisinopril  was discontinued by cardiology as she had to be placed on Verapamil  for HOCM Counseled for compliance with the medications Advised DASH diet and moderate exercise/walking, at least 150 mins/week

## 2024-10-19 NOTE — Assessment & Plan Note (Addendum)
 Last CMP reviewed, GFR stable around 45 Avoid nephrotoxic agents On Jardiance  Recheck CMP

## 2024-10-19 NOTE — Assessment & Plan Note (Signed)
 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 Medically optimized for panniculectomy with moderate risk

## 2024-10-19 NOTE — Assessment & Plan Note (Signed)
 Noted on recent echo, but pressure gradients improved compared to prior Followed by cardiology On Jardiance  for HFpEF On metoprolol  and verapamil 

## 2024-10-19 NOTE — Assessment & Plan Note (Signed)
 Lab Results  Component Value Date   TSH 2.960 06/17/2024   On levothyroxine  112 mcg daily now Had elevated free T4 in the last visit, Levothyroxine  dose was reduced -  recheck TSH and free T4

## 2024-10-19 NOTE — Progress Notes (Signed)
 Established Patient Office Visit  Subjective:  Patient ID: Erin Sexton, female    DOB: 03/25/1971  Age: 53 y.o. MRN: [id number]  CC:  Chief Complaint  Patient presents with   Medical Management of Chronic Issues    4 month f/u     HPI EVANGELINA Sexton is a 53 y.o. female with past medical history of HTN, type II DM, hypothyroidism, HLD, OSA, asthma and morbid obesity who presents for f/u of her chronic medical conditions.  Type II DM: Her blood glucose has been better controlled with Mounjaro  15 mg qw now. She also takes Jardiance  10 mg once daily for HOCM/CHF.  Denies any polyuria or polydipsia currently.  She also takes statin for HLD. She has lost about 25 lbs with Mounjaro . She had lost about 30 lbs with Trulicity .  She is also doing intermittent fasting.  CKD: Her last CMP showed GFR of 43 in 09/25.  She denies any dysuria, hematuria or urinary hesitance or resistance.  Migraine: She had neurology evaluation for migraine.  She has been taking Topamax  and as needed Nurtec.  Her headache frequency and intensity have improved now.  HTN and HOCM: BP is well-controlled. Takes medications regularly. Patient denies headache, dizziness, chest pain, or palpitations.  She follows up with cardiology for HOCM.  She has chronic leg swelling and exertional dyspnea, but have improved with weight loss.  Hypothyroidism: She has been taking Levothyroxine  112 mcg QD. Denies any recent change in appetite, tremors or palpitations.  She has abdominal skin sagging due to weight loss.  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  She had plastic surgery consultation for it. She is planned to get panniculectomy due to panniculus related skin infections. She is currently using Lotrisone  cream for intertrigo.  She had I&D for abdominal wall abscess in 05/25. She had to undergo wound care with [facility] general surgery for about 5 months.  She completed ciprofloxacin  for wound  infection, which showed Pseudomonas aeruginosa on culture.  Denies any recent episode of fever or chills.  Past Medical History:  Diagnosis Date   Abnormal CT scan    a. 02/2014: evaluated for dyspnea and initially placed on Xarelto  for PE because radiologist could not exclude small peripheral pulmonary emboli because of limited contrast. However, after admission for ++vaginal bleeding several days later, it was felt she did NOT have PE - underwent further testing with neg VQ 03/06/14, neg LE duplex.   Anemia    Anxiety    Arthritis    Asthma    Blood transfusion without reported diagnosis    Bone spur of right foot    Cataract 11/2022   CHF (congestive heart failure) (HCC)    COPD (chronic obstructive pulmonary disease) (HCC)    Depression    Diabetes mellitus without complication (HCC)    a. Dx 02/2014.   Heart murmur    History of echocardiogram 2015   normal EF, Grade II diastolic dysfunction   HOCM (hypertrophic obstructive cardiomyopathy) (HCC)    a. Dx 67984.   Hyperlipidemia    Hypertension    Morbid obesity (HCC)    NSVT (nonsustained ventricular tachycardia) (HCC)    a. Noted on tele during 02/2014 adm for symptomatic anemia.   Stroke [facility])    SVT (supraventricular tachycardia)    a. Noted on tele during 02/2014 adm for symptomatic anemia.   Thyroid  disease    Vaginal bleeding     Past Surgical History:  Procedure Laterality Date  COLONOSCOPY N/A 09/30/2019   Procedure: COLONOSCOPY;  Surgeon: Golda Claudis PENNER, MD;  Location: [facility];  Service: Endoscopy;  Laterality: N/A;  730   DILITATION & CURRETTAGE/HYSTROSCOPY WITH NOVASURE ABLATION N/A 08/07/2016   Procedure: DILATATION & CURETTAGE/HYSTEROSCOPY WITH ATTPEMPTED NOVASURE ABLATION AND INSERTION OF IUD;  Surgeon: Vonn VEAR Inch, MD;  Location: [facility] ORS;  Service: Gynecology;  Laterality: N/A;    Family History  Problem Relation Age of Onset   Diabetes Mother    Arthritis Mother    Other Mother        WPW    Mental illness Mother        Depression   Asthma Mother    Obesity Mother    Heart disease Father        History of coronary bypass grafting at age 71   Hypertension Father    Mental illness Sister    Bipolar disorder Sister    Heart disease Sister        possibly WPW   Depression Sister    Varicose Veins Sister    Uterine cancer Maternal Grandmother    Mental illness Maternal Grandmother    Arthritis Maternal Grandmother    Hyperlipidemia Maternal Grandmother    Heart disease Maternal Grandfather    CAD Neg Hx        No family history of EARLY CAD    Social History   Socioeconomic History   Marital status: Divorced    Spouse name: Not on file   Number of children: 0   Years of education: Not on file   Highest education level: Associate degree: occupational, scientist, product/process development, or vocational program  Occupational History   Occupation: Disabled  Tobacco Use   Smoking status: Never   Smokeless tobacco: Never  Vaping Use   Vaping status: Never Used  Substance and Sexual Activity   Alcohol use: Never   Drug use: Never   Sexual activity: Not Currently    Birth control/protection: I.U.D., Post-menopausal    Comment: IUD in place to control vaginal bleeding  Other Topics Concern   Not on file  Social History Narrative   Divorced for 3 years,married for 5 years.On disability since 2009 secondary to mental illness.   Social Drivers of Corporate Investment Banker Strain: Low Risk  (10/04/2024)   Overall Financial Resource Strain (CARDIA)    Difficulty of Paying Living Expenses: Not very hard  Food Insecurity: No Food Insecurity (10/04/2024)   Hunger Vital Sign    Worried About Running Out of Food in the Last Year: Never true    Ran Out of Food in the Last Year: Never true  Transportation Needs: No Transportation Needs (10/04/2024)   PRAPARE - Administrator, Civil Service (Medical): No    Lack of Transportation (Non-Medical): No  Physical Activity: Insufficiently  Active (10/04/2024)   Exercise Vital Sign    Days of Exercise per Week: 3 days    Minutes of Exercise per Session: 30 min  Stress: Stress Concern Present (10/04/2024)   [employer] of Occupational Health - Occupational Stress Questionnaire    Feeling of Stress: To some extent  Social Connections: Socially Isolated (10/04/2024)   Social Connection and Isolation Panel    Frequency of Communication with Friends and Family: More than three times a week    Frequency of Social Gatherings with Friends and Family: Never    Attends Religious Services: Never    Database Administrator or Organizations: No  Attends Banker Meetings: Not on file    Marital Status: Divorced  Intimate Partner Violence: Not At Risk (10/04/2024)   Humiliation, Afraid, Rape, and Kick questionnaire    Fear of Current or Ex-Partner: No    Emotionally Abused: No    Physically Abused: No    Sexually Abused: No    Outpatient Medications Prior to Visit  Medication Sig Dispense Refill   ACCU-CHEK GUIDE TEST test strip USE 1 STRIP TO CHECK GLUCOSE ONCE DAILY 100 each 0   Accu-Chek Softclix Lancets lancets USE 1 LANCET TO CHECK GLUCOSE ONCE DAILY 100 each 3   atorvastatin  (LIPITOR) 80 MG tablet Take 1 tablet (80 mg total) by mouth daily. 90 tablet 3   blood glucose meter kit and supplies KIT Dispense based on patient and insurance preference. Use once a day. Diagnosis code:E11.9 1 each 0   buPROPion  (WELLBUTRIN  XL) 300 MG 24 hr tablet Take 1 tablet (300 mg total) by mouth daily. 90 tablet 3   Cholecalciferol (VITAMIN D3 PO) Take 10,000 Units by mouth daily.     clindamycin (CLEOCIN T) 1 % lotion Apply 1 Application topically 2 (two) times daily as needed (rash).     clotrimazole -betamethasone  (LOTRISONE ) cream APPLY  CREAM TOPICALLY TO AFFECTED AREA TWICE DAILY 30 g 2   empagliflozin  (JARDIANCE ) 10 MG TABS tablet Take 1 tablet (10 mg total) by mouth daily. 90 tablet 3   estradiol  (ESTRACE ) 2 MG tablet  TAKE 1 TABLET BY MOUTH AT BEDTIME 90 tablet 3   levocetirizine (XYZAL ) 5 MG tablet TAKE 1 TABLET BY MOUTH ONCE DAILY IN THE EVENING 90 tablet 0   levonorgestrel  (MIRENA ) 20 MCG/24HR IUD 1 each by Intrauterine route once.     levothyroxine  (SYNTHROID ) 112 MCG tablet Take 1 tablet by mouth once daily 90 tablet 0   megestrol  (MEGACE ) 40 MG tablet Take 1 tablet (40 mg total) by mouth daily. 30 tablet 11   metoprolol  tartrate (LOPRESSOR ) 50 MG tablet Take 1 tablet (50 mg total) by mouth 2 (two) times daily. 180 tablet 3   Rimegepant Sulfate (NURTEC) 75 MG TBDP Take 1 tablet (75 mg total) by mouth as needed. 8 tablet 11   tirzepatide  (MOUNJARO ) 15 MG/0.5ML Pen INJECT 15 MG SUBCUTANEOUSLY  ONCE A WEEK 9 mL 3   topiramate  (TOPAMAX ) 100 MG tablet Take 1 tablet (100 mg total) by mouth daily. 90 tablet 3   verapamil  (CALAN -SR) 240 MG CR tablet Take 1 tablet (240 mg total) by mouth at bedtime. 90 tablet 2   No facility-administered medications prior to visit.    No Known Allergies  ROS Review of Systems  Constitutional:  Negative for chills and fever.  HENT:  Negative for congestion, sinus pressure, sinus pain and sore throat.   Eyes:  Negative for pain and discharge.  Respiratory:  Negative for cough and shortness of breath.   Cardiovascular:  Positive for leg swelling. Negative for chest pain and palpitations.  Gastrointestinal:  Negative for abdominal pain, diarrhea, nausea and vomiting.  Endocrine: Negative for polydipsia and polyuria.  Genitourinary:  Negative for dysuria and hematuria.  Musculoskeletal:  Positive for arthralgias. Negative for neck pain and neck stiffness.  Skin:  Negative for rash.  Neurological:  Negative for dizziness and weakness.  Psychiatric/Behavioral:  Negative for agitation and behavioral problems.       Objective:    Physical Exam Vitals reviewed.  Constitutional:      General: She is not in acute distress.  Appearance: She is obese. She is not  diaphoretic.  HENT:     Head: Normocephalic and atraumatic.     Nose: Nose normal. No congestion.     Mouth/Throat:     Mouth: Mucous membranes are moist.     Pharynx: No posterior oropharyngeal erythema.  Eyes:     General: No scleral icterus.    Extraocular Movements: Extraocular movements intact.  Cardiovascular:     Rate and Rhythm: Normal rate and regular rhythm.     Heart sounds: Normal heart sounds. No murmur heard. Pulmonary:     Breath sounds: Normal breath sounds. No wheezing or rales.  Abdominal:     Palpations: Abdomen is soft.     Tenderness: There is no abdominal tenderness.     Hernia: A hernia (Umbilical) is present.     Comments: Abdominal skin sagging - panniculus  Musculoskeletal:     Cervical back: Neck supple. No tenderness.     Right lower leg: No edema.     Left lower leg: No edema.  Skin:    General: Skin is warm.     Findings: No rash.     Comments: Varicose veins on LE bilaterally  Neurological:     General: No focal deficit present.     Mental Status: She is alert and oriented to person, place, and time.  Psychiatric:        Mood and Affect: Mood normal.        Behavior: Behavior normal.     BP 126/84 (BP Location: Left Arm)   Pulse 73   Ht 5' 9 (1.753 m)   Wt 246 lb 12.8 oz (111.9 kg)   SpO2 99%   BMI 36.45 kg/m  Wt Readings from Last 3 Encounters:  10/19/24 246 lb 12.8 oz (111.9 kg)  10/04/24 248 lb 1.3 oz (112.5 kg)  09/21/24 242 lb (109.8 kg)    Lab Results  Component Value Date   TSH 2.960 06/17/2024   Lab Results  Component Value Date   WBC 8.0 09/06/2024   HGB 17.5 (H) 09/06/2024   HCT 49.7 (H) 09/06/2024   MCV 94.7 09/06/2024   PLT 192 09/06/2024   Lab Results  Component Value Date   NA 142 09/06/2024   K 4.1 09/06/2024   CO2 18 (L) 09/06/2024   GLUCOSE 87 09/06/2024   BUN 25 (H) 09/06/2024   CREATININE 1.45 (H) 09/06/2024   BILITOT 0.7 09/06/2024   ALKPHOS 76 09/06/2024   AST 27 09/06/2024   ALT 44  09/06/2024   PROT 6.6 09/06/2024   ALBUMIN  3.9 09/06/2024   CALCIUM  9.0 09/06/2024   ANIONGAP 12 09/06/2024   EGFR 51 (L) 06/17/2024   Lab Results  Component Value Date   CHOL 95 (L) 06/16/2023   Lab Results  Component Value Date   HDL 34 (L) 06/16/2023   Lab Results  Component Value Date   LDLCALC 45 06/16/2023   Lab Results  Component Value Date   TRIG 78 06/16/2023   Lab Results  Component Value Date   CHOLHDL 2.8 06/16/2023   Lab Results  Component Value Date   HGBA1C 4.8 06/17/2024      Assessment & Plan:   Problem List Items Addressed This Visit       Cardiovascular and Mediastinum   Essential hypertension, benign (Chronic)   BP Readings from Last 1 Encounters:  10/19/24 126/84   Well-controlled with Metoprolol  and Verapamil  Lisinopril  was discontinued by cardiology as she had to be  placed on Verapamil  for HOCM Counseled for compliance with the medications Advised DASH diet and moderate exercise/walking, at least 150 mins/week      HOCM (hypertrophic obstructive cardiomyopathy) (HCC) (Chronic)   Noted on recent echo, but pressure gradients improved compared to prior Followed by cardiology On Jardiance  for HFpEF On metoprolol  and verapamil         Respiratory   Asthma, chronic   Well-controlled with Symbicort  and as needed albuterol         Endocrine   Type 2 diabetes mellitus with other specified complication (HCC) - Primary   Lab Results  Component Value Date   HGBA1C 4.8 06/17/2024   Well controlled now, was 11.7 in 2021 Associated with HTN, CHF and HLD On Mounjaro  15 mg qw and Jardiance  10 mg QD  Home blood glucose readings reviewed -mostly between 90-120 Advised to follow diabetic diet On statin and ACEi F/u CMP and lipid panel Diabetic eye exam: Advised to follow up with Ophthalmology for diabetic eye exam      Relevant Orders   Microalbumin / creatinine urine ratio   CMP14+EGFR   Hemoglobin A1c   Hypothyroidism   Lab  Results  Component Value Date   TSH 2.960 06/17/2024   On levothyroxine  112 mcg daily now Had elevated free T4 in the last visit, Levothyroxine  dose was reduced -  recheck TSH and free T4      Relevant Orders   TSH + free T4     Genitourinary   Stage 3a chronic kidney disease (HCC)   Last CMP reviewed, GFR stable around 45 Avoid nephrotoxic agents On Jardiance  Recheck CMP        Other   Morbid obesity (HCC)   BMI Readings from Last 3 Encounters:  10/19/24 36.45 kg/m  10/04/24 36.64 kg/m  09/21/24 35.74 kg/m   Associated with DM and HTN Continue low-carb diet and moderate exercise as tolerated On Mounjaro  for type 2 DM      Abdominal panniculus, symptomatic   Has 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 Medically optimized for panniculectomy with moderate risk       No orders of the defined types were placed in this encounter.   Follow-up: Return in about 4 months (around 02/16/2025) for DM and CKD.    Suzzane MARLA Blanch, MD

## 2024-10-19 NOTE — Assessment & Plan Note (Signed)
 Lab Results  Component Value Date   HGBA1C 4.8 06/17/2024   Well controlled now, was 11.7 in 2021 Associated with HTN, CHF and HLD On Mounjaro  15 mg qw and Jardiance  10 mg QD  Home blood glucose readings reviewed -mostly between 90-120 Advised to follow diabetic diet On statin and ACEi F/u CMP and lipid panel Diabetic eye exam: Advised to follow up with Ophthalmology for diabetic eye exam

## 2024-10-19 NOTE — Patient Instructions (Signed)
 Please continue to take medications as prescribed.  Please continue to follow low carb diet and perform moderate exercise/walking at least 150 mins/week.

## 2024-10-19 NOTE — Assessment & Plan Note (Signed)
Well-controlled with Symbicort and as needed albuterol ?

## 2024-10-20 ENCOUNTER — Ambulatory Visit: Payer: Self-pay | Admitting: Internal Medicine

## 2024-10-20 LAB — CMP14+EGFR
ALT: 44 IU/L — ABNORMAL HIGH (ref 0–32)
AST: 28 IU/L (ref 0–40)
Albumin: 4.1 g/dL (ref 3.8–4.9)
Alkaline Phosphatase: 86 IU/L (ref 49–135)
BUN/Creatinine Ratio: 16 (ref 9–23)
BUN: 24 mg/dL (ref 6–24)
Bilirubin Total: 0.8 mg/dL (ref 0.0–1.2)
CO2: 19 mmol/L — ABNORMAL LOW (ref 20–29)
Calcium: 9 mg/dL (ref 8.7–10.2)
Chloride: 109 mmol/L — ABNORMAL HIGH (ref 96–106)
Creatinine, Ser: 1.52 mg/dL — ABNORMAL HIGH (ref 0.57–1.00)
Globulin, Total: 2.2 g/dL (ref 1.5–4.5)
Glucose: 67 mg/dL — ABNORMAL LOW (ref 70–99)
Potassium: 4.3 mmol/L (ref 3.5–5.2)
Sodium: 141 mmol/L (ref 134–144)
Total Protein: 6.3 g/dL (ref 6.0–8.5)
eGFR: 40 mL/min/1.73 — ABNORMAL LOW (ref >59)

## 2024-10-20 LAB — HEMOGLOBIN A1C
Est. average glucose Bld gHb Est-mCnc: 97 mg/dL
Hgb A1c MFr Bld: 5 % (ref 4.8–5.6)

## 2024-10-20 LAB — TSH+FREE T4
Free T4: 1.97 ng/dL — ABNORMAL HIGH (ref 0.82–1.77)
TSH: 2.24 u[IU]/mL (ref 0.450–4.500)

## 2024-10-21 LAB — MICROALBUMIN / CREATININE URINE RATIO
Creatinine, Urine: 173.2 mg/dL
Microalb/Creat Ratio: 17 mg/g{creat} (ref 0–29)
Microalbumin, Urine: 28.7 ug/mL

## 2024-11-08 ENCOUNTER — Other Ambulatory Visit: Payer: Self-pay | Admitting: Internal Medicine

## 2024-11-08 DIAGNOSIS — E039 Hypothyroidism, unspecified: Secondary | ICD-10-CM

## 2024-11-09 ENCOUNTER — Ambulatory Visit: Attending: Internal Medicine | Admitting: Internal Medicine

## 2024-11-09 VITALS — BP 130/80 | HR 75 | Ht 64.0 in | Wt 248.0 lb

## 2024-11-09 DIAGNOSIS — I421 Obstructive hypertrophic cardiomyopathy: Secondary | ICD-10-CM | POA: Insufficient documentation

## 2024-11-09 NOTE — Progress Notes (Signed)
 Cardiology Office Note:  .    Date:  11/09/2024  ID:  Erin Sexton, DOB 19-Feb-1971, MRN [id number] PCP: [name], MD  [facility] Providers Cardiologist:  [name], MD     CC: HCM Consulted for the evaluation of oHCM at the behest of Dr. Mallipeddi   History of Present Illness: Erin Sexton is a 53 y.o. female with hypertrophic obstructive cardiomyopathy who presents for evaluation of her condition and symptom management. She is accompanied by her mother. She was referred by Dr. Malapedi for evaluation of her hypertrophic obstructive cardiomyopathy.  She has a history of hypertrophic obstructive cardiomyopathy with a left ventricular outflow tract peak gradient of 49 mmHg with Valsalva and experiences [place]  Heart Association class II symptoms. Despite being on dual AV nodal therapy with verapamil  240 mg and metoprolol  tartrate, she reports that her numbers have improved and she no longer has a severe obstruction, according to her previous cardiology visit. An MRI revealed a septal thickness of 16 mm.  She has experienced significant weight loss from 400 pounds to 248 pounds, resulting in loose skin that causes painful rashes with extensive walking. She describes her current physical condition as the best shape she has been in since her teenage years, with improved energy levels and the ability to perform daily activities without significant shortness of breath or chest pain.  No chest pain, significant shortness of breath, or palpitations. She recalls a recent active day involving extensive walking in [place], which left her feeling tired. She has a history of premature ventricular contractions and recalls wearing a Holter monitor in February, which she remembers as being negative.  Her family history includes heart issues on both sides, with her father having had a triple bypass and her aunt having a pacemaker. She has been diagnosed with  Wolff-Parkinson-White syndrome, but no family members are known to have hypertrophic cardiomyopathy. She has a younger sister who has not been screened for hypertrophic cardiomyopathy.  Discussed the use of AI scribe software for clinical note transcription with the patient, who gave verbal consent to proceed.   Relevant histories: .  Social  - WPW but no HCM in the family ROS: As per HPI.   Studies Reviewed: .     Cardiac Studies & Procedures   ______________________________________________________________________________________________     ECHOCARDIOGRAM  ECHOCARDIOGRAM LIMITED 09/09/2024  Narrative ECHOCARDIOGRAM LIMITED REPORT    Patient Name:   Erin Sexton [facility] Date of Exam: 09/09/2024 Medical Rec #:  [id number]         Height:       69.0 in Accession #:    [id number]        Weight:       243.0 lb Date of Birth:  04-06-1971          BSA:          2.244 m Patient Age:    53 years          BP:           124/80 mmHg Patient Gender: F                 HR:           72 bpm. Exam Location:  [place]  Procedure: Limited Echo, Cardiac Doppler, Limited Color Doppler and Intracardiac Opacification Agent (Both Spectral and Color Flow Doppler were utilized during procedure).  Indications:     I42.2 HOCM  History:  Patient has prior history of Echocardiogram examinations, most recent 03/31/2024. Hypertrophic Cardiomyopathy, Signs/Symptoms:Chest Pain, Dyspnea and Fatigue; Risk Factors:Morbid obesity, Hypertension, Diabetes, Dyslipidemia, Sleep Apnea and Non-Smoker.  Sonographer:     Bascom Burows RCS, RVS Referring Phys:  [id number] DIANNAH SQUIBB MALLIPEDDI Diagnosing Phys: Dorn Ross MD   Sonographer Comments: Image acquisition challenging due to patient body habitus. IMPRESSIONS   1. There is severe asymmetrical septal hypertrophy with systolic anterior motion of the anterior mitral valve leaflet consistent with hypertrophic obstructive cardiomyopathy. Resting peak LVOT  dynamic gradient of 21 mmHg, peak gradient with Valsalva is 48 mmHg. SABRA Left ventricular ejection fraction, by estimation, is 65 to 70%. The left ventricle has normal function. The left ventricle has no regional wall motion abnormalities. There is severe asymmetric left ventricular hypertrophy of the septal segment. Left ventricular diastolic parameters are consistent with Grade I diastolic dysfunction (impaired relaxation). 2. Right ventricular systolic function is normal. The right ventricular size is normal. 3. Aortic dilatation noted. There is mild dilatation of the ascending aorta, measuring 39 mm. 4. Limited echo,study to reevaluate hypertrophic cardiomyopathy.  Comparison(s): A prior study was performed on 03/30/2024. EF >75%, hyperdynamic function of the left ventricle. Severe asymmetric LVH of the lateral segment. Peak LVOT gradient at rest 23 mmHg and 77 mmHg with Valsalva.  FINDINGS Left Ventricle: There is severe asymmetrical septal hypertrophy with systolic anterior motion of the anterior mitral valve leaflet consistent with hypertrophic obstructive cardiomyopathy. Resting peak LVOT dynamic gradient of 21 mmHg, peak gradient with Valsalva is 48 mmHg. Left ventricular ejection fraction, by estimation, is 65 to 70%. The left ventricle has normal function. The left ventricle has no regional wall motion abnormalities. Definity  contrast agent was given IV to delineate the left ventricular endocardial borders. The left ventricular internal cavity size was normal in size. There is severe asymmetric left ventricular hypertrophy of the septal segment. Left ventricular diastolic parameters are consistent with Grade I diastolic dysfunction (impaired relaxation).  Right Ventricle: The right ventricular size is normal. Right vetricular wall thickness was not well visualized. Right ventricular systolic function is normal.  Mitral Valve: MV peak gradient, 4.0 mmHg. The mean mitral valve gradient is 3.0  mmHg.  Aortic Valve: Aortic valve peak gradient measures 21.3 mmHg.  Aorta: The aortic root is normal in size and structure and aortic dilatation noted. There is mild dilatation of the ascending aorta, measuring 39 mm.  LEFT VENTRICLE PLAX 2D LVIDd:         3.50 cm     Diastology LVIDs:         2.10 cm     LV e' medial:    2.83 cm/s LV PW:         1.90 cm     LV E/e' medial:  33.3 LV IVS:        1.10 cm     LV e' lateral:   9.46 cm/s LVOT diam:     2.50 cm     LV E/e' lateral: 10.0 LVOT Area:     4.91 cm  LV Volumes (MOD) LV vol d, MOD A2C: 45.6 ml LV vol d, MOD A4C: 54.8 ml LV vol s, MOD A2C: 15.9 ml LV vol s, MOD A4C: 9.7 ml LV SV MOD A2C:     29.7 ml LV SV MOD A4C:     54.8 ml LV SV MOD BP:      37.1 ml  RIGHT VENTRICLE RV Basal diam:  2.40 cm RV Mid diam:  3.10 cm RV S prime:     10.40 cm/s  LEFT ATRIUM             Index        RIGHT ATRIUM          Index LA Vol (A2C):   61.4 ml 27.36 ml/m  RA Area:     8.67 cm LA Vol (A4C):   42.6 ml 18.98 ml/m  RA Volume:   17.40 ml 7.75 ml/m LA Biplane Vol: 54.3 ml 24.19 ml/m AORTIC VALVE AV Vmax:      231.00 cm/s AV Peak Grad: 21.3 mmHg  AORTA Ao Root diam: 2.90 cm Ao Asc diam:  3.70 cm  MITRAL VALVE MV Area (PHT): 2.70 cm     SHUNTS MV Peak grad:  4.0 mmHg     Systemic Diam: 2.50 cm MV Mean grad:  3.0 mmHg MV Vmax:       1.00 m/s MV Vmean:      77.2 cm/s MV Decel Time: 281 msec MV E velocity: 94.30 cm/s MV A velocity: 123.00 cm/s MV E/A ratio:  0.77  Dorn Ross MD Electronically signed by Dorn Ross MD Signature Date/Time: 09/13/2024/7:51:43 AM    Final (Updated)    MONITORS  LONG TERM MONITOR-LIVE TELEMETRY (3-14 DAYS) 03/23/2024  Narrative   Patch wear time was for 6 days and 23 hours.   Normal sinus rhythm ranging from 65 to 179 bpm with an average HR 82 bpm.   2 runs of nonsustained SVT occurred, fastest/longest lasting 8 beats; 1 run of NSVT lasting 7 beats.   No evidence of AV block  or pauses.   <1% PAC burden and <1% PVC burden.   Patient triggered events correlated with NSR, 84 bpm and PVC.     CARDIAC MRI  MR CARDIAC MORPHOLOGY W WO CONTRAST 02/18/2024  Narrative CLINICAL DATA:  HCM evaluation  EXAM: CARDIAC MRI  TECHNIQUE: The patient was scanned on a 1.5 Tesla Siemens magnet. A dedicated cardiac coil was used. Functional imaging was done using Fiesta sequences. 2,3, and 4 chamber views were done to assess for RWMA's. Modified Simpson's rule using a short axis stack was used to calculate an ejection fraction on a dedicated work [employer]. The patient received 10 cc of Gadavist . After 10 minutes inversion recovery sequences were used to assess for infiltration and scar tissue. Phase contrast velocity mapping was performed above the aortic and pulmonic valves  CONTRAST:  10 cc  of Gadavist   FINDINGS: Left ventricle:  -Asymmetric hypertrophy measuring 16mm in septum (10mm in posterior wall)  -LVOT obstruction due to systolic anterior motion of anterior mitral valve leaflet  -Normal size  -Normal systolic function  -Normal ECV (27%)  -Patchy LGE at RV insertion site and basal inferolateral wall. LGE accounts for 8% of total myocardial mass  LV EF:  54% (Normal 52-79%)  Absolute volumes:  LV EDV: (Normal 78-167 mL)  LV ESV: 65mL (Normal 21-64 mL)  LV SV: 77mL (Normal 52-114 mL)  CO: 6.0L/min (Normal 2.7-6.3 L/min)  Indexed volumes:  LV EDV: 41mL/sq-m (Normal 50-96 mL/sq-m)  LV ESV: 37mL/sq-m (Normal 10-40 mL/sq-m)  LV SV: 42mL/sq-m (Normal 33-64 mL/sq-m)  CI: 2.6L/min/sq-m (Normal 1.9-3.9 L/min/sq-m)  Right ventricle: Small size with normal systolic function  RV EF: 51% (Normal 52-80%)  Absolute volumes:  RV EDV: 94mL (Normal 79-175 mL)  RV ESV: 46mL (Normal 13-75 mL)  RV SV: 47mL (Normal 56-110 mL)  CO: 3.7L/min (Normal 2.7-6 L/min)  Indexed  volumes:  RV EDV: 63mL/sq-m (Normal 51-97  mL/sq-m)  RV ESV: 17mL/sq-m (Normal 9-42 mL/sq-m)  RV SV: 51mL/sq-m (Normal 35-61 mL/sq-m)  CI: 1.6L/min/sq-m (Normal 1.8-3.8 L/min/sq-m)  Left atrium: Mild enlargement  Right atrium: Normal size  Mitral valve: Moderate to severe regurgitation; moderate by regurgitant volume (36cc), severe by regurgitant fraction (47%)  Aortic valve: Tricuspid. Trivial regurgitation  Tricuspid valve: Trivial regurgitation  Pulmonic valve: Trivial regurgitation  Aorta: Normal proximal ascending aorta  Pericardium: Normal  IMPRESSION: 1. Asymmetric LV hypertrophy measuring 16mm in septum (10mm in posterior wall), meeting criteria for hypertrophic cardiomyopathy. There is LVOT obstruction due to systolic anterior motion of anterior mitral valve leaflet; LVOT obstruction was not quantified on this study  2. Patchy LGE at basal inferolateral wall and RV insertion site, consistent with HCM. LGE accounts for 8% of total myocardial mass  3.  Normal LV size and systolic function (EF 54%)  4.  Small RV size with normal systolic function (EF 51%)  5. Moderate to severe mitral regurgitation; moderate by regurgitant volume (36cc), severe by regurgitant fraction (47%)   Electronically Signed By: Lonni Nanas M.D. On: 02/18/2024 17:16   ______________________________________________________________________________________________        Physical Exam:    VS:  BP 130/80   Pulse 75   Ht 5' 4 (1.626 m)   Wt 248 lb (112.5 kg)   SpO2 96%   BMI 42.57 kg/m    Wt Readings from Last 3 Encounters:  11/09/24 248 lb (112.5 kg)  10/19/24 246 lb 12.8 oz (111.9 kg)  10/04/24 248 lb 1.3 oz (112.5 kg)    Gen: no distress, morbid obesity   Neck: No JVD Cardiac: No Rubs or Gallops, Systolic Murmur, RRR +2 radial pulses Respiratory: Clear to auscultation bilaterally, normal effort, normal  respiratory rate GI: Soft, nontender, non-distended  MS: No  edema;  moves all  extremities Integument: Skin feels warm Neuro:  At time of evaluation, alert and oriented to person/place/time/situation  Psych: Normal affect, patient feels ok     ASSESSMENT AND PLAN: .     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: Moderate; Family history of WPW in mother, aunt had early PPM but sister has had no issues (PRKAG2 is a highly penetrant gene) - Gene variant: Reaching out for possible sponsored genetic testing; if non then we will defer until after surgery - NYHA I  Family history Reviewed, Discussed family screening  - Presently sister is not interested in testing, if unable to get free testing will get testing done later  SCD  Assessment - SCD risk estimated to be 1.33 at 5 years (assuming HCM) SDM: we have discussed Class III indication for ICD  Atrial fibrillation Assessment  - HCM-AF score 15 - Atrial arrhythmia management: had PVC and SVT but no NSVT or AF on 2024 heart monitor; discussed post operative AF risk  Medication symptom plan - asymptomatic with great fMETS and has lost over 150 lbs. LVOT peak gradient of 49 mmHg with Valsalva maneuver. NYHA class I symptoms. Asymptomatic with improved physical activity and energy levels. Differential diagnosis includes phenocopy due to Wolff-Parkinson-White syndrome. No current need for medication changes due to symptom improvement. - Continue verapamil  and metoprolol  tartrate. - Ordered genetic testing for PRKAG2 gene mutation to assess for phenocopy due to Wolff-Parkinson-White syndrome. - Monitor for symptoms of arrhythmia or worsening obstruction. - would  bring back for CMI therapy if symptoms - careful post operative plan to avoid excess operative inotropy or tachycardia; I agree with Dr. Alissa peri-operative plan  GENETIC TESTING COUNSELING EVALUATION:  I met with the  patient today to discuss cardiac genetic testing. We reviewed the following information:  INDICATION FOR TESTING: * Suspected/confirmed diagnosis of WPW vs HCM * Family history of WPW and early family history of   TESTING OPTION(S) DISCUSSED: - Sponsored testing if available or [facility]   BENEFITS OF GENETIC TESTING DISCUSSED:  * May confirm or refine clinical diagnosis * May guide treatment decisions and medical management * May provide prognostic information * May identify at-risk family members who could benefit from screening * May avoid unnecessary testing or interventions for family members who test negative  LIMITATIONS AND RISKS DISCUSSED:  * Testing may not identify a genetic cause, even if one exists * Results may change over time as more information becomes available * Possible discrimination concerns  FINANCIAL CONSIDERATIONS DISCUSSED:  * Insurance coverage and potential out-of-pocket costs (Medicare, likely will not be covered) * Free testing program eligibility (if still available) for PRKAG2 in isolation  LIFE INSURANCE/DISABILITY INSURANCE IMPLICATIONS:  * GINA protection applies to health insurance and employment but not life, disability, or long-term care insurance * Recommendation to consider securing insurance prior to testing if concerned * Documentation considerations for insurance applications  FAMILY IMPLICATIONS: * Cascade testing approach for relatives if positive result * Communication strategies with family members * Resources for family discussions  PATIENT UNDERSTANDING AND DECISION:  * Patient demonstrated understanding of the above information * Patient had opportunity to ask questions which were addressed * Patient decision: proceed with testing if free testing is available, otherwise will defer to next visit  FOLLOW-UP PLAN:  * Results disclosure plan: in-person for positive result  Time:   I have spent a total of 60 minutes with the  patient reviewing notes, imaging, EKGs, labs,  and examining the patient as well as establishing an assessment and plan that was discussed personally with the patient. Discussed disease state education , using shared decision making tools and cardiac modeling and genetic testing.  Stanly Leavens, MD FASE [facility] Cardiologist [facility]  [address] [place], [address] [place], [place] [address] [phone]  5:39 PM

## 2024-11-09 NOTE — Patient Instructions (Signed)
 Medication Instructions:  Your physician recommends that you continue on your current medications as directed. Please refer to the Current Medication list given to you today.  *If you need a refill on your cardiac medications before your next appointment, please call your pharmacy*  Lab Work: None.  If you have labs (blood work) drawn today and your tests are completely normal, you will receive your results only by: MyChart Message (if you have MyChart) OR A paper copy in the mail If you have any lab test that is abnormal or we need to change your treatment, we will call you to review the results.  Testing/Procedures: None.  Follow-Up: At [facility], you and your health needs are our priority.  As part of our continuing mission to provide you with exceptional heart care, our providers are all part of one team.  This team includes your primary Cardiologist (physician) and Advanced Practice Providers or APPs (Physician Assistants and Nurse Practitioners) who all work together to provide you with the care you need, when you need it.  Your next appointment:   9 month(s)  Provider:   Dr. EMERSON Leavens, MD   We recommend signing up for the patient portal called MyChart.  Sign up information is provided on this After Visit Summary.  MyChart is used to connect with patients for Virtual Visits (Telemedicine).  Patients are able to view lab/test results, encounter notes, upcoming appointments, etc.  Non-urgent messages can be sent to your provider as well.   To learn more about what you can do with MyChart, go to [email or web address].   Other Instructions Dr. Leavens or his office will contact you regarding genetic testing.

## 2024-11-23 ENCOUNTER — Other Ambulatory Visit: Payer: Self-pay | Admitting: Internal Medicine

## 2024-11-23 ENCOUNTER — Encounter: Payer: Self-pay | Admitting: Internal Medicine

## 2024-11-23 ENCOUNTER — Encounter: Payer: Self-pay | Admitting: Surgery

## 2024-11-23 ENCOUNTER — Ambulatory Visit: Admitting: Surgery

## 2024-11-23 VITALS — BP 132/86 | HR 66 | Temp 98.2°F | Resp 16 | Ht 64.0 in | Wt 246.0 lb

## 2024-11-23 DIAGNOSIS — L02211 Cutaneous abscess of abdominal wall: Secondary | ICD-10-CM | POA: Diagnosis not present

## 2024-11-23 MED ORDER — DOXYCYCLINE HYCLATE 100 MG PO TABS: 100.0000 mg | ORAL_TABLET | Freq: Two times a day (BID) | ORAL | 0 refills | Status: AC

## 2024-11-23 NOTE — Progress Notes (Unsigned)
 [facility] Note   HPI:  53 y.o. Female presents to clinic for wound recheck at the site of previous abdominal wall incision and drainage.  She has been doing well, and states that the areas have remained healed over.  She feels some firmness underneath them, but denies any pain, drainage, erythema, fever, or chills.  Review of Systems:  All other review of systems: otherwise negative   Vital Signs:  BP 132/86   Pulse 66   Temp 98.2 F (36.8 C) (Oral)   Resp 16   Ht 5' 4 (1.626 m)   Wt 246 lb (111.6 kg)   SpO2 98%   BMI 42.23 kg/m    Physical Exam:  Physical Exam Vitals reviewed.  Constitutional:      Appearance: Normal appearance.  Abdominal:     Comments: Abdomen soft, nondistended, no percussion tenderness, nontender palpation; no rigidity, guarding, rebound tenderness; left upper quadrant previous I&D sites well-healed with some underlying scar tissue without evidence of abscess recurrence  Neurological:     Mental Status: She is alert.     Laboratory studies: None  Imaging:  None  Assessment:  53 y.o. yo Female who presents for wound recheck status post previous in office incision and drainage for abdominal wall abscess.  Plan:  - Given that the areas have completely healed and have not caused any problems since her last follow-up, patient does not need to follow-up anymore with me regarding these areas - Patient advised that she is scheduled to follow-up with plastic surgery in a couple of weeks to discuss possible panniculectomy.  I explained to the patient that if she is not considered a candidate for panniculectomy, she can undergo ventral hernia repair with me - Follow up with me as needed  All of the above recommendations were discussed with the patient, and all of patient's questions were answered to her expressed satisfaction.  Note: Portions of this report may have been transcribed using voice recognition software. Every effort has been  made to ensure accuracy; however, inadvertent computerized transcription errors may still be present.   Dorothyann Brittle, DO [facility] [address] [place], [place] [address] [phone] (office)

## 2024-11-30 ENCOUNTER — Ambulatory Visit: Admitting: Plastic Surgery

## 2024-11-30 ENCOUNTER — Encounter: Payer: Self-pay | Admitting: Plastic Surgery

## 2024-11-30 VITALS — BP 152/89 | HR 65 | Ht 64.0 in | Wt 245.6 lb

## 2024-11-30 DIAGNOSIS — R21 Rash and other nonspecific skin eruption: Secondary | ICD-10-CM

## 2024-11-30 DIAGNOSIS — M793 Panniculitis, unspecified: Secondary | ICD-10-CM

## 2024-11-30 DIAGNOSIS — E65 Localized adiposity: Secondary | ICD-10-CM | POA: Diagnosis not present

## 2024-11-30 DIAGNOSIS — M542 Cervicalgia: Secondary | ICD-10-CM | POA: Diagnosis not present

## 2024-11-30 DIAGNOSIS — M546 Pain in thoracic spine: Secondary | ICD-10-CM

## 2024-11-30 DIAGNOSIS — M7989 Other specified soft tissue disorders: Secondary | ICD-10-CM

## 2024-11-30 DIAGNOSIS — G8929 Other chronic pain: Secondary | ICD-10-CM

## 2024-11-30 DIAGNOSIS — M549 Dorsalgia, unspecified: Secondary | ICD-10-CM | POA: Insufficient documentation

## 2024-11-30 NOTE — Progress Notes (Signed)
 "    Patient ID: Erin Sexton, female    DOB: 1971/02/04, 53 y.o.   MRN: [id number]   Chief Complaint  Patient presents with   Advice Only    The patient is a 53 year old female here for evaluation of her abdomen.  She has been really good about losing weight.  She is now 5 feet 9 inches tall and weighs 245 pounds.  She has lost 150 pounds in the past 2 years.  She is very pleased with her progress.  Unfortunately she still has a lot of skin breakdown and rashes in her skin folds.  Sometimes they are so bad it is actually difficult to even walk.  She is not a smoker but does have diabetes.  Her hemoglobin A1c in November was 5 so she is well-controlled.  She has tried creams and lotions she has also tried pads none of which helps long-term.  It does not feel like she has hernia.  She complains of a lot of back and neck pain.  I can see how the pannus is contributing to her pain.  Her past medical history is listed below and also includes hyperlipidemia, hypertension, supraventricular tachycardia, arthritis, and anemia.    Review of Systems  Constitutional:  Positive for activity change. Negative for appetite change.  HENT: Negative.    Eyes: Negative.   Respiratory: Negative.    Cardiovascular: Negative.   Gastrointestinal: Negative.   Endocrine: Negative.   Genitourinary: Negative.   Musculoskeletal:  Positive for back pain and neck pain.  Skin:  Positive for rash.    Past Medical History:  Diagnosis Date   Abnormal CT scan    a. 02/2014: evaluated for dyspnea and initially placed on Xarelto  for PE because radiologist could not exclude small peripheral pulmonary emboli because of limited contrast. However, after admission for ++vaginal bleeding several days later, it was felt she did NOT have PE - underwent further testing with neg VQ 03/06/14, neg LE duplex.   Anemia    Anxiety    Arthritis    Asthma    Blood transfusion without reported diagnosis    Bone spur of right foot     Cataract 11/2022   CHF (congestive heart failure) (HCC)    COPD (chronic obstructive pulmonary disease) (HCC)    Depression    Diabetes mellitus without complication (HCC)    a. Dx 02/2014.   Heart murmur    History of echocardiogram 2015   normal EF, Grade II diastolic dysfunction   HOCM (hypertrophic obstructive cardiomyopathy) (HCC)    a. Dx 67984.   Hyperlipidemia    Hypertension    Morbid obesity (HCC)    NSVT (nonsustained ventricular tachycardia) (HCC)    a. Noted on tele during 02/2014 adm for symptomatic anemia.   Stroke [facility])    SVT (supraventricular tachycardia)    a. Noted on tele during 02/2014 adm for symptomatic anemia.   Thyroid  disease    Vaginal bleeding     Past Surgical History:  Procedure Laterality Date   COLONOSCOPY N/A 09/30/2019   Procedure: COLONOSCOPY;  Surgeon: Golda Claudis PENNER, MD;  Location: [facility];  Service: Endoscopy;  Laterality: N/A;  730   DILITATION & CURRETTAGE/HYSTROSCOPY WITH NOVASURE ABLATION N/A 08/07/2016   Procedure: DILATATION & CURETTAGE/HYSTEROSCOPY WITH ATTPEMPTED NOVASURE ABLATION AND INSERTION OF IUD;  Surgeon: Vonn VEAR Inch, MD;  Location: [facility];  Service: Gynecology;  Laterality: N/A;     Current Medications[1]   Objective:   Vitals:  11/30/24 1304  BP: (!) 152/89  Pulse: 65    Physical Exam Vitals reviewed.  Constitutional:      Appearance: Normal appearance.  HENT:     Head: Normocephalic and atraumatic.  Cardiovascular:     Rate and Rhythm: Normal rate.     Pulses: Normal pulses.  Pulmonary:     Effort: Pulmonary effort is normal.  Abdominal:     General: There is no distension.     Palpations: Abdomen is soft.     Tenderness: There is no abdominal tenderness.  Musculoskeletal:        General: Swelling and tenderness present.  Skin:    General: Skin is warm.     Capillary Refill: Capillary refill takes less than 2 seconds.     Coloration: Skin is not jaundiced.     Findings: Erythema present. No  bruising.  Neurological:     Mental Status: She is alert and oriented to person, place, and time.  Psychiatric:        Mood and Affect: Mood normal.        Behavior: Behavior normal.        Thought Content: Thought content normal.        Judgment: Judgment normal.     Assessment & Plan:  Abdominal panniculus, symptomatic  Leg swelling  Chronic bilateral thoracic back pain  The patient is a candidate for panniculectomy and I think she would really benefit from removal of that excess skin and soft tissue.  She understands she has an extremely high likelihood of complications due to her weight change and diabetes.  Patient would like to move forward with submission to insurance for the possibility of a panniculectomy.  Pictures were obtained of the patient and placed in the chart with the patient's or guardian's permission.   Erin RAMAN Darryl Blumenstein, DO    [1]  Current Outpatient Medications:    ACCU-CHEK GUIDE TEST test strip, USE 1 STRIP TO CHECK GLUCOSE ONCE DAILY, Disp: 100 each, Rfl: 0   Accu-Chek Softclix Lancets lancets, USE 1 LANCET TO CHECK GLUCOSE ONCE DAILY, Disp: 100 each, Rfl: 3   atorvastatin  (LIPITOR) 80 MG tablet, Take 1 tablet (80 mg total) by mouth daily., Disp: 90 tablet, Rfl: 3   blood glucose meter kit and supplies KIT, Dispense based on patient and insurance preference. Use once a day. Diagnosis code:E11.9, Disp: 1 each, Rfl: 0   buPROPion  (WELLBUTRIN  XL) 300 MG 24 hr tablet, Take 1 tablet (300 mg total) by mouth daily., Disp: 90 tablet, Rfl: 3   Cholecalciferol (VITAMIN D3 PO), Take 10,000 Units by mouth daily., Disp: , Rfl:    clindamycin (CLEOCIN T) 1 % lotion, Apply 1 Application topically 2 (two) times daily as needed (rash)., Disp: , Rfl:    clotrimazole -betamethasone  (LOTRISONE ) cream, APPLY  CREAM TOPICALLY TO AFFECTED AREA TWICE DAILY, Disp: 30 g, Rfl: 2   doxycycline  (VIBRA -TABS) 100 MG tablet, Take 1 tablet (100 mg total) by mouth 2 (two) times daily.,  Disp: 20 tablet, Rfl: 0   empagliflozin  (JARDIANCE ) 10 MG TABS tablet, Take 1 tablet (10 mg total) by mouth daily., Disp: 90 tablet, Rfl: 3   estradiol  (ESTRACE ) 2 MG tablet, TAKE 1 TABLET BY MOUTH AT BEDTIME, Disp: 90 tablet, Rfl: 3   levocetirizine (XYZAL ) 5 MG tablet, TAKE 1 TABLET BY MOUTH ONCE DAILY IN THE EVENING, Disp: 90 tablet, Rfl: 0   levonorgestrel  (MIRENA ) 20 MCG/24HR IUD, 1 each by Intrauterine route once., Disp: , Rfl:  levothyroxine  (SYNTHROID ) 112 MCG tablet, Take 1 tablet by mouth once daily, Disp: 90 tablet, Rfl: 0   megestrol  (MEGACE ) 40 MG tablet, Take 1 tablet (40 mg total) by mouth daily., Disp: 30 tablet, Rfl: 11   metoprolol  tartrate (LOPRESSOR ) 50 MG tablet, Take 1 tablet (50 mg total) by mouth 2 (two) times daily., Disp: 180 tablet, Rfl: 3   Rimegepant Sulfate (NURTEC) 75 MG TBDP, Take 1 tablet (75 mg total) by mouth as needed., Disp: 8 tablet, Rfl: 11   tirzepatide  (MOUNJARO ) 15 MG/0.5ML Pen, INJECT 15 MG SUBCUTANEOUSLY  ONCE A WEEK, Disp: 9 mL, Rfl: 3   topiramate  (TOPAMAX ) 100 MG tablet, Take 1 tablet (100 mg total) by mouth daily., Disp: 90 tablet, Rfl: 3   verapamil  (CALAN -SR) 240 MG CR tablet, Take 1 tablet (240 mg total) by mouth at bedtime., Disp: 90 tablet, Rfl: 2  "

## 2024-12-06 ENCOUNTER — Telehealth: Payer: Self-pay

## 2024-12-06 ENCOUNTER — Telehealth: Payer: Self-pay | Admitting: Internal Medicine

## 2024-12-06 NOTE — Telephone Encounter (Signed)
 Faxed surgical clearance to PCP Dr. Suzzane Blanch. Confirmation of receipt.

## 2024-12-06 NOTE — Telephone Encounter (Signed)
 Called patient to schedule in office visit, per patient she said she has already discuss and seen Dr Tobie and he has already completed everything needed for this surgery, waiting on medicare approval.  Surgery clearance  Copied Noted Sleeved Original placed provider box (patel) Copy placed front desk folder

## 2024-12-20 ENCOUNTER — Encounter: Payer: Self-pay | Admitting: Internal Medicine

## 2024-12-20 ENCOUNTER — Other Ambulatory Visit: Payer: Self-pay

## 2024-12-20 DIAGNOSIS — E119 Type 2 diabetes mellitus without complications: Secondary | ICD-10-CM

## 2024-12-20 MED ORDER — ACCU-CHEK SOFTCLIX LANCETS MISC: 3 refills | Status: DC

## 2024-12-22 ENCOUNTER — Other Ambulatory Visit: Payer: Self-pay

## 2024-12-22 ENCOUNTER — Telehealth: Payer: Self-pay

## 2024-12-22 DIAGNOSIS — E119 Type 2 diabetes mellitus without complications: Secondary | ICD-10-CM

## 2024-12-22 MED ORDER — ACCU-CHEK GUIDE TEST VI STRP: ORAL_STRIP | 3 refills | Status: DC

## 2024-12-22 MED ORDER — ACCU-CHEK GUIDE TEST VI STRP: ORAL_STRIP | 0 refills | Status: DC

## 2024-12-22 MED ORDER — ACCU-CHEK GUIDE TEST VI STRP: ORAL_STRIP | 3 refills | Status: AC

## 2024-12-22 MED ORDER — ACCU-CHEK SOFTCLIX LANCETS MISC: 3 refills | Status: DC

## 2024-12-22 MED ORDER — ACCU-CHEK SOFTCLIX LANCETS MISC: 3 refills | Status: AC

## 2024-12-22 NOTE — Telephone Encounter (Signed)
 Copied from CRM [id number]. Topic: Clinical - Prescription Issue >> Dec 22, 2024  3:14 PM Nathanel BROCKS wrote: Reason for CRM: [facility], [place], [phone]  On the meter, test strips and lancets, medicare wants it to say, directions (example use once daily etc) please call pharmacy to advise.

## 2024-12-22 NOTE — Telephone Encounter (Signed)
 Rx resent.

## 2025-01-05 ENCOUNTER — Other Ambulatory Visit: Payer: Self-pay | Admitting: Internal Medicine

## 2025-01-05 DIAGNOSIS — J4540 Moderate persistent asthma, uncomplicated: Secondary | ICD-10-CM

## 2025-01-12 ENCOUNTER — Encounter: Payer: Self-pay | Admitting: Plastic Surgery

## 2025-01-13 ENCOUNTER — Telehealth: Payer: Self-pay | Admitting: Plastic Surgery

## 2025-01-13 NOTE — Telephone Encounter (Signed)
 So sorry, her appt is tomorrow 01/14/2025

## 2025-01-13 NOTE — Telephone Encounter (Signed)
 Pt is snowed in at home and unable to make appt, she does not want to miss appt.  Requesting a telephone visit if possible.  Please advise and I'll change and call her back.

## 2025-01-14 ENCOUNTER — Ambulatory Visit: Admitting: Plastic Surgery

## 2025-01-17 ENCOUNTER — Ambulatory Visit: Admitting: Student

## 2025-01-19 ENCOUNTER — Ambulatory Visit: Admitting: Internal Medicine

## 2025-03-10 ENCOUNTER — Ambulatory Visit: Admitting: Internal Medicine

## 2025-10-05 ENCOUNTER — Ambulatory Visit
